# Patient Record
Sex: Female | Born: 1945 | Race: White | Hispanic: No | Smoking: Former smoker
Health system: Southern US, Community
[De-identification: ages and names within clinical notes are randomized; demographics above are authoritative.]

## PROBLEM LIST (undated history)

## (undated) DIAGNOSIS — E039 Hypothyroidism, unspecified: Secondary | ICD-10-CM

## (undated) DIAGNOSIS — S42309A Unspecified fracture of shaft of humerus, unspecified arm, initial encounter for closed fracture: Secondary | ICD-10-CM

## (undated) DIAGNOSIS — E669 Obesity, unspecified: Secondary | ICD-10-CM

## (undated) DIAGNOSIS — K5790 Diverticulosis of intestine, part unspecified, without perforation or abscess without bleeding: Secondary | ICD-10-CM

## (undated) DIAGNOSIS — Z86010 Personal history of colonic polyps: Secondary | ICD-10-CM

## (undated) DIAGNOSIS — M1990 Unspecified osteoarthritis, unspecified site: Secondary | ICD-10-CM

## (undated) DIAGNOSIS — K649 Unspecified hemorrhoids: Secondary | ICD-10-CM

## (undated) DIAGNOSIS — F419 Anxiety disorder, unspecified: Secondary | ICD-10-CM

## (undated) DIAGNOSIS — E785 Hyperlipidemia, unspecified: Secondary | ICD-10-CM

## (undated) DIAGNOSIS — I10 Essential (primary) hypertension: Secondary | ICD-10-CM

## (undated) DIAGNOSIS — F32A Depression, unspecified: Secondary | ICD-10-CM

## (undated) DIAGNOSIS — G4730 Sleep apnea, unspecified: Secondary | ICD-10-CM

## (undated) DIAGNOSIS — M7050 Other bursitis of knee, unspecified knee: Secondary | ICD-10-CM

## (undated) DIAGNOSIS — F329 Major depressive disorder, single episode, unspecified: Secondary | ICD-10-CM

## (undated) DIAGNOSIS — K219 Gastro-esophageal reflux disease without esophagitis: Secondary | ICD-10-CM

## (undated) DIAGNOSIS — J45909 Unspecified asthma, uncomplicated: Secondary | ICD-10-CM

## (undated) DIAGNOSIS — G43909 Migraine, unspecified, not intractable, without status migrainosus: Secondary | ICD-10-CM

## (undated) DIAGNOSIS — E119 Type 2 diabetes mellitus without complications: Secondary | ICD-10-CM

## (undated) DIAGNOSIS — T7840XA Allergy, unspecified, initial encounter: Secondary | ICD-10-CM

## (undated) HISTORY — DX: Type 2 diabetes mellitus without complications: E11.9

## (undated) HISTORY — DX: Personal history of colonic polyps: Z86.010

## (undated) HISTORY — DX: Diverticulosis of intestine, part unspecified, without perforation or abscess without bleeding: K57.90

## (undated) HISTORY — DX: Anxiety disorder, unspecified: F41.9

## (undated) HISTORY — DX: Allergy, unspecified, initial encounter: T78.40XA

## (undated) HISTORY — DX: Hyperlipidemia, unspecified: E78.5

## (undated) HISTORY — PX: GLAUCOMA SURGERY: SHX656

## (undated) HISTORY — PX: COLONOSCOPY: SHX174

## (undated) HISTORY — PX: CARPAL TUNNEL RELEASE: SHX101

## (undated) HISTORY — DX: Unspecified osteoarthritis, unspecified site: M19.90

## (undated) HISTORY — DX: Gastro-esophageal reflux disease without esophagitis: K21.9

## (undated) HISTORY — DX: Hypothyroidism, unspecified: E03.9

## (undated) HISTORY — DX: Sleep apnea, unspecified: G47.30

## (undated) HISTORY — PX: TOENAIL AVULSION: SHX1074

## (undated) HISTORY — DX: Obesity, unspecified: E66.9

## (undated) HISTORY — PX: POLYPECTOMY: SHX149

## (undated) HISTORY — DX: Essential (primary) hypertension: I10

## (undated) HISTORY — DX: Major depressive disorder, single episode, unspecified: F32.9

## (undated) HISTORY — DX: Other bursitis of knee, unspecified knee: M70.50

## (undated) HISTORY — DX: Migraine, unspecified, not intractable, without status migrainosus: G43.909

## (undated) HISTORY — PX: CATARACT EXTRACTION W/ INTRAOCULAR LENS  IMPLANT, BILATERAL: SHX1307

## (undated) HISTORY — DX: Unspecified asthma, uncomplicated: J45.909

## (undated) HISTORY — DX: Depression, unspecified: F32.A

## (undated) HISTORY — DX: Unspecified fracture of shaft of humerus, unspecified arm, initial encounter for closed fracture: S42.309A

---

## 1976-08-29 HISTORY — PX: EXCISION MORTON'S NEUROMA: SHX5013

## 1989-08-29 HISTORY — PX: PARTIAL HYSTERECTOMY: SHX80

## 1999-12-03 ENCOUNTER — Other Ambulatory Visit: Admission: RE | Admit: 1999-12-03 | Discharge: 1999-12-03 | Payer: Self-pay | Admitting: *Deleted

## 2003-10-31 ENCOUNTER — Encounter: Admission: RE | Admit: 2003-10-31 | Discharge: 2003-10-31 | Payer: Self-pay | Admitting: Family Medicine

## 2004-01-27 ENCOUNTER — Encounter: Admission: RE | Admit: 2004-01-27 | Discharge: 2004-01-27 | Payer: Self-pay | Admitting: Family Medicine

## 2004-09-06 ENCOUNTER — Encounter: Admission: RE | Admit: 2004-09-06 | Discharge: 2004-09-06 | Payer: Self-pay | Admitting: Family Medicine

## 2004-09-24 ENCOUNTER — Ambulatory Visit: Payer: Self-pay | Admitting: Internal Medicine

## 2004-09-24 ENCOUNTER — Ambulatory Visit: Payer: Self-pay | Admitting: Pulmonary Disease

## 2004-09-24 ENCOUNTER — Ambulatory Visit (HOSPITAL_BASED_OUTPATIENT_CLINIC_OR_DEPARTMENT_OTHER): Admission: RE | Admit: 2004-09-24 | Discharge: 2004-09-24 | Payer: Self-pay | Admitting: Family Medicine

## 2004-10-28 ENCOUNTER — Ambulatory Visit: Payer: Self-pay | Admitting: Internal Medicine

## 2005-07-13 ENCOUNTER — Ambulatory Visit: Payer: Self-pay | Admitting: Gastroenterology

## 2006-01-03 ENCOUNTER — Emergency Department (HOSPITAL_COMMUNITY): Admission: EM | Admit: 2006-01-03 | Discharge: 2006-01-03 | Payer: Self-pay | Admitting: Emergency Medicine

## 2006-10-16 ENCOUNTER — Encounter: Admission: RE | Admit: 2006-10-16 | Discharge: 2006-10-16 | Payer: Self-pay | Admitting: Family Medicine

## 2006-10-27 ENCOUNTER — Other Ambulatory Visit: Admission: RE | Admit: 2006-10-27 | Discharge: 2006-10-27 | Payer: Self-pay | Admitting: Family Medicine

## 2006-11-14 ENCOUNTER — Encounter: Admission: RE | Admit: 2006-11-14 | Discharge: 2006-11-14 | Payer: Self-pay | Admitting: Family Medicine

## 2009-07-09 ENCOUNTER — Encounter (INDEPENDENT_AMBULATORY_CARE_PROVIDER_SITE_OTHER): Payer: Self-pay | Admitting: *Deleted

## 2010-09-15 ENCOUNTER — Encounter
Admission: RE | Admit: 2010-09-15 | Discharge: 2010-09-15 | Payer: Self-pay | Source: Home / Self Care | Attending: Orthopedic Surgery | Admitting: Orthopedic Surgery

## 2010-11-18 ENCOUNTER — Ambulatory Visit (AMBULATORY_SURGERY_CENTER): Payer: Medicare Other | Admitting: *Deleted

## 2010-11-18 VITALS — Ht 64.0 in | Wt 238.0 lb

## 2010-11-18 DIAGNOSIS — Z86010 Personal history of colonic polyps: Secondary | ICD-10-CM

## 2010-11-18 MED ORDER — PEG-KCL-NACL-NASULF-NA ASC-C 100 G PO SOLR: 1.0000 | Freq: Once | ORAL | Status: AC

## 2010-12-02 ENCOUNTER — Encounter: Payer: Self-pay | Admitting: Internal Medicine

## 2010-12-02 ENCOUNTER — Ambulatory Visit (AMBULATORY_SURGERY_CENTER): Payer: Medicare Other | Admitting: Internal Medicine

## 2010-12-02 VITALS — BP 134/99 | HR 75 | Temp 97.9°F | Resp 18 | Ht 64.0 in | Wt 230.0 lb

## 2010-12-02 DIAGNOSIS — Z86010 Personal history of colonic polyps: Secondary | ICD-10-CM

## 2010-12-02 DIAGNOSIS — D126 Benign neoplasm of colon, unspecified: Secondary | ICD-10-CM

## 2010-12-02 DIAGNOSIS — K635 Polyp of colon: Secondary | ICD-10-CM

## 2010-12-02 DIAGNOSIS — Z860101 Personal history of adenomatous and serrated colon polyps: Secondary | ICD-10-CM

## 2010-12-02 DIAGNOSIS — Z1211 Encounter for screening for malignant neoplasm of colon: Secondary | ICD-10-CM

## 2010-12-02 DIAGNOSIS — K5730 Diverticulosis of large intestine without perforation or abscess without bleeding: Secondary | ICD-10-CM

## 2010-12-02 HISTORY — DX: Personal history of adenomatous and serrated colon polyps: Z86.0101

## 2010-12-02 HISTORY — DX: Personal history of colonic polyps: Z86.010

## 2010-12-02 MED ORDER — SODIUM CHLORIDE 0.9 % IV SOLN: 500.0000 mL | INTRAVENOUS | Status: DC

## 2010-12-02 NOTE — Patient Instructions (Signed)
One small polyp was removed and mild diverticulosis seen.   Polyps, Colon  A polyp is extra tissue that grows inside your body. Colon polyps grow in the large intestine. The large intestine, also called the colon, is part of your digestive system. It is a long, hollow tube at the end of your digestive tract where your body makes and stores stool. Most polyps are not dangerous. They are benign. This means they are not cancerous. But over time, some types of polyps can turn into cancer. Polyps that are smaller than a pea are usually not harmful. But larger polyps could someday become or may already be cancerous. To be safe, doctors remove all polyps and test them.  WHO GETS POLYPS? Anyone can get polyps, but certain people are more likely than others. You may have a greater chance of getting polyps if:  You are over 50.   You have had polyps before.   Someone in your family has had polyps.   Someone in your family has had cancer of the large intestine.   Find out if someone in your family has had polyps. You may also be more likely to get polyps if you:   Eat a lot of fatty foods   Smoke   Drink alcohol   Do not exercise  Eat too much  SYMPTOMS Most small polyps do not cause symptoms. People often do not know they have one until their caregiver finds it during a regular checkup or while testing them for something else. Some people do have symptoms like these:  Bleeding from the anus. You might notice blood on your underwear or on toilet paper after you have had a bowel movement.   Constipation or diarrhea that lasts more than a week.   Blood in the stool. Blood can make stool look black or it can show up as red streaks in the stool.  If you have any of these symptoms, see your caregiver. HOW DOES THE DOCTOR TEST FOR POLYPS? The doctor can use four tests to check for polyps:  Digital rectal exam. The caregiver wears gloves and checks your rectum (the last part of the large intestine)  to see if it feels normal. This test would find polyps only in the rectum. Your caregiver may need to do one of the other tests listed below to find polyps higher up in the intestine.   Barium enema. The caregiver puts a liquid called barium into your rectum before taking x-rays of your large intestine. Barium makes your intestine look white in the pictures. Polyps are dark, so they are easy to see.   Sigmoidoscopy. With this test, the caregiver can see inside your large intestine. A thin flexible tube is placed into your rectum. The device is called a sigmoidoscope, which has a light and a tiny video camera in it. The caregiver uses the sigmoidoscope to look at the last third of your large intestine.   Colonoscopy. This test is like sigmoidoscopy, but the caregiver looks at all of the large intestine. It usually requires sedation. This is the most common method for finding and removing polyps.  TREATMENT  The caregiver will remove the polyp during sigmoidoscopy or colonoscopy. The polyp is then tested for cancer.   If you have had polyps, your caregiver may want you to get tested regularly in the future.  PREVENTION There is not one sure way to prevent polyps. You might be able to lower your risk of getting them if you:  Eat more fruits and vegetables and less fatty food.   Do not smoke.   Avoid alcohol.   Exercise every day.   Lose weight if you are overweight.   Eating more calcium and folate can also lower your risk of getting polyps. Some foods that are rich in calcium are milk, cheese, and broccoli. Some foods that are rich in folate are chickpeas, kidney beans, and spinach.   Aspirin might help prevent polyps. Studies are under way.  Document Released: 05/11/2004 Document Re-Released: 02/02/2010 [facility] Patient Information 2011 [place], [place].  Diverticulosis Diverticulosis is a common condition that develops when small pouches (diverticula) form in the wall of the colon. The  risk of diverticulosis increases with age. It happens more often in people who eat a low-fiber diet. Most individuals with diverticulosis have no symptoms. Those individuals with symptoms usually experience belly (abdominal) pain, constipation, or loose stools (diarrhea). HOME CARE INSTRUCTIONS  Increase the amount of fiber in your diet as directed by your caregiver or dietician. This may reduce symptoms of diverticulosis.   Your caregiver may recommend taking a dietary fiber supplement.   Drink at least 6 to 8 glasses of water each day to prevent constipation.   Try not to strain when you have a bowel movement.   Your caregiver may recommend avoiding nuts and seeds to prevent complications, although this is still an uncertain benefit.   Only take over-the-counter or prescription medicines for pain, discomfort, or fever as directed by your caregiver.  FOODS HAVING HIGH FIBER CONTENT INCLUDE:  Fruits. Apple, peach, pear, tangerine, raisins, prunes.   Vegetables. Brussels sprouts, asparagus, broccoli, cabbage, carrot, cauliflower, romaine lettuce, spinach, summer squash, tomato, winter squash, zucchini.   Starchy Vegetables. Baked beans, kidney beans, lima beans, split peas, lentils, potatoes (with skin).   Grains. Whole wheat bread, brown rice, bran flake cereal, plain oatmeal, white rice, shredded wheat, bran muffins.  SEEK IMMEDIATE MEDICAL CARE IF:  You develop increasing pain or severe bloating.   You have an oral temperature above 100.33F, not controlled by medicine.   You develop vomiting or bowel movements that are bloody or black.  Document Released: 05/12/2004 Document Re-Released: 02/02/2010 [facility] Patient Information 2011 [place], [place].

## 2010-12-06 ENCOUNTER — Telehealth: Payer: Self-pay | Admitting: *Deleted

## 2010-12-06 NOTE — Telephone Encounter (Signed)
Follow up Call- Patient questions:  Do you have a fever, pain , or abdominal swelling? yes Pain Score  1 *  Have you tolerated food without any problems? yes  Have you been able to return to your normal activities? yes  Do you have any questions about your discharge instructions: Diet   no Medications  no Follow up visit  no  Do you have questions or concerns about your Care? no  Actions: * If pain score is 4 or above: No action needed, pain <4.

## 2010-12-14 ENCOUNTER — Encounter: Payer: Self-pay | Admitting: Internal Medicine

## 2010-12-14 NOTE — Progress Notes (Signed)
Quick Note:  6mm adenoma and mild diverticulosis Repeat colon 2017 ______

## 2011-01-14 NOTE — Procedures (Signed)
NAME:  Megan [name], Megan [name]            ACCOUNT NO.:  [id number]   MEDICAL RECORD NO.:  [id number]          PATIENT TYPE:  OUT   LOCATION:  SLEEP CENTER                 FACILITY:  [facility]   PHYSICIAN:  Clinton D. Maple Hudson, M.D. DATE OF BIRTH:  [birth year]   DATE OF STUDY:  09/24/2004                              NOCTURNAL POLYSOMNOGRAM   STUDY DATE:  September 24, 2004   REFERRING PHYSICIAN:  Dr. Donia Guiles   INDICATION FOR STUDY:  Hypersomnia with sleep apnea.  Epworth Sleepiness  Score 8/24, BMI 38.2, weight 230 pounds.   SLEEP ARCHITECTURE:  Total sleep time 300 minutes with sleep efficiency 72%.  Stage I was 12%, stage II 53%, stages III and IV 16%, REM was 19% of total  sleep time.  Sleep latency 5 minutes, REM latency 127 minutes, awake after  sleep onset 119 minutes, arousal index 7.2.  No medications taken.   RESPIRATORY DATA:  Split-study protocol.  RDI 45.4 obstructive events per  hour indicating moderately severe obstructive sleep apnea/hypopnea syndrome  before CPAP.  This included 74 obstructive apneas and 35 hypopneas before  CPAP.  Events were not positional with most sleep on left side.  REM RDI  35.8.  CPAP was titrated to 11 CWP, RDI 6.2/hr using a medium ResMed Ultra  Mirage Mask with heated humidifier.   OXYGEN DATA:  Mild to moderate snoring and mouth breathing with oxygen  desaturation to 71% before CPAP.  After CPAP control saturation held 95-98%  on room air.   CARDIAC DATA:  Normal sinus rhythm.   MOVEMENT/PARASOMNIA:  Insignificant occasional leg jerk.   IMPRESSION/RECOMMENDATION:  1.  Moderately severe obstructive sleep apnea/hypopnea syndrome, RDI 45.4/hr      with oxygen desaturation to 71%.  2.  Continuous positive airway pressure titration to 11 CWP, RDI 6.2/hr      using a medium ResMed Ultra Mirage Mask with heated humidifier.     CDY/MEDQ  D:  10/03/2004 10:58:18  T:  10/03/2004 21:52:39  Job:  [id number]

## 2011-10-31 ENCOUNTER — Emergency Department (HOSPITAL_COMMUNITY): Payer: Medicare Other

## 2011-10-31 ENCOUNTER — Encounter (HOSPITAL_COMMUNITY): Payer: Self-pay

## 2011-10-31 ENCOUNTER — Emergency Department (HOSPITAL_COMMUNITY)
Admission: EM | Admit: 2011-10-31 | Discharge: 2011-10-31 | Disposition: A | Discharge status: Home / Self Care | Payer: Medicare Other | Attending: Emergency Medicine | Admitting: Emergency Medicine

## 2011-10-31 DIAGNOSIS — J029 Acute pharyngitis, unspecified: Secondary | ICD-10-CM | POA: Insufficient documentation

## 2011-10-31 DIAGNOSIS — Z79899 Other long term (current) drug therapy: Secondary | ICD-10-CM | POA: Insufficient documentation

## 2011-10-31 DIAGNOSIS — E785 Hyperlipidemia, unspecified: Secondary | ICD-10-CM | POA: Insufficient documentation

## 2011-10-31 DIAGNOSIS — I10 Essential (primary) hypertension: Secondary | ICD-10-CM | POA: Insufficient documentation

## 2011-10-31 DIAGNOSIS — R1110 Vomiting, unspecified: Secondary | ICD-10-CM

## 2011-10-31 DIAGNOSIS — J40 Bronchitis, not specified as acute or chronic: Secondary | ICD-10-CM | POA: Insufficient documentation

## 2011-10-31 DIAGNOSIS — Z8739 Personal history of other diseases of the musculoskeletal system and connective tissue: Secondary | ICD-10-CM | POA: Insufficient documentation

## 2011-10-31 DIAGNOSIS — K219 Gastro-esophageal reflux disease without esophagitis: Secondary | ICD-10-CM | POA: Insufficient documentation

## 2011-10-31 DIAGNOSIS — H9209 Otalgia, unspecified ear: Secondary | ICD-10-CM | POA: Insufficient documentation

## 2011-10-31 LAB — URINALYSIS, ROUTINE W REFLEX MICROSCOPIC
Bilirubin Urine: NEGATIVE
Glucose, UA: NEGATIVE mg/dL
Ketones, ur: NEGATIVE mg/dL
Leukocytes, UA: NEGATIVE
Protein, ur: NEGATIVE mg/dL
pH: 5.5 (ref 5.0–8.0)

## 2011-10-31 LAB — POCT I-STAT, CHEM 8
BUN: 13 mg/dL (ref 6–23)
Calcium, Ion: 1.12 mmol/L (ref 1.12–1.32)
Chloride: 98 mEq/L (ref 96–112)
Glucose, Bld: 123 mg/dL — ABNORMAL HIGH (ref 70–99)
TCO2: 30 mmol/L (ref 0–100)

## 2011-10-31 LAB — DIFFERENTIAL
Eosinophils Relative: 3 % (ref 0–5)
Lymphocytes Relative: 20 % (ref 12–46)
Monocytes Absolute: 0.6 10*3/uL (ref 0.1–1.0)
Monocytes Relative: 6 % (ref 3–12)
Neutro Abs: 7.2 10*3/uL (ref 1.7–7.7)

## 2011-10-31 LAB — CBC
HCT: 36.1 % (ref 36.0–46.0)
Hemoglobin: 12.3 g/dL (ref 12.0–15.0)
MCV: 84.5 fL (ref 78.0–100.0)
RDW: 13.2 % (ref 11.5–15.5)
WBC: 10.1 10*3/uL (ref 4.0–10.5)

## 2011-10-31 MED ORDER — ONDANSETRON HCL 4 MG/2ML IJ SOLN
4.0000 mg | Freq: Once | INTRAMUSCULAR | Status: AC
  Administered 2011-10-31: 4 mg via INTRAVENOUS
  Filled 2011-10-31: qty 2

## 2011-10-31 MED ORDER — SODIUM CHLORIDE 0.9 % IV BOLUS (SEPSIS)
1000.0000 mL | Freq: Once | INTRAVENOUS | Status: AC
  Administered 2011-10-31: 1000 mL via INTRAVENOUS

## 2011-10-31 MED ORDER — SODIUM CHLORIDE 0.9 % IV BOLUS (SEPSIS)
500.0000 mL | Freq: Once | INTRAVENOUS | Status: AC
  Administered 2011-10-31: 500 mL via INTRAVENOUS

## 2011-10-31 NOTE — ED Notes (Signed)
Pt given discharge info and verb understanding. amb with steady gait to discharge window.  

## 2011-10-31 NOTE — ED Provider Notes (Signed)
History     CSN: [id number]  Arrival date & time 10/31/11  1100   First MD Initiated Contact with Patient 10/31/11 1127      Chief Complaint  Patient presents with  . Nausea  . Emesis  . Sore Throat    right side  . Otalgia    left side    (Consider location/radiation/quality/duration/timing/severity/associated sxs/prior treatment) HPI  66 year old female with cold symptoms for the past 5 days. Patient states initially she was having some sore throat follows with itchy eyes, ear aches, runny nose, dry cough, body aches, and headache. She was initially seen at her primary care office yesterday for the symptoms. It was thought to be due to an upper respiratory infection. Patient was prescribed cough medication, allergic medication, and amoxicillin. Patient states she went home, continues to have dry unproductive cough which induce vomiting from her violent cough. She has vomited 5 times within the past day. She denies any blood or bilious contents from  vomit. She denies any significant abdominal pain. She has subjective fever and pleuritic chest pain from coughing. She denies dysuria no rash. Patient feels dehydrated, constant vomiting and decided to come to the ED.  Past Medical History  Diagnosis Date  . Arthritis   . Allergy     bee stings  . Cataract     had surgery   . GERD (gastroesophageal reflux disease)   . Hyperlipidemia   . Hypertension   . Glaucoma   . Knee bursitis     Past Surgical History  Procedure Date  . Colonoscopy   . Partial hysterectomy     Left an ovary  . Cataract extraction w/ intraocular lens  implant, bilateral   . Glaucoma surgery     and laser surgery  . Carpal tunnel release     left wrist  . Excision morton's neuroma     left foot  . Abdominal hysterectomy     Family History  Problem Relation Age of Onset  . Colon polyps Maternal Grandfather   . Colon cancer Maternal Grandfather     History  Substance Use Topics  . Smoking  status: Former Games developer  . Smokeless tobacco: Not on file  . Alcohol Use: No    OB History    Grav Para Term Preterm Abortions TAB SAB Ect Mult Living                  Review of Systems  All other systems reviewed and are negative.    Allergies  Aspirin; Codeine; Sulfa antibiotics; Band-aid anti-itch; and Penicillins  Home Medications   Current Outpatient Rx  Name Route Sig Dispense Refill  . VITAMIN C 1000 MG PO TABS Oral Take 1,000 mg by mouth daily.      . ATENOLOL 50 MG PO TABS Oral Take 50 mg by mouth daily.      . CHLORTHALIDONE 25 MG PO TABS Oral Take 25 mg by mouth daily.      [name] VITAMIN D 1000 UNITS PO TABS Oral Take 1,000 Units by mouth daily.      . IBUPROFEN 600 MG PO TABS Oral Take 600 mg by mouth every 6 (six) hours as needed. Takes 1/2 tablet as needed     . OLMESARTAN MEDOXOMIL 40 MG PO TABS Oral Take 40 mg by mouth daily. Takes 1/2 tablet     . ZEGERID OTC PO Oral Take 20 mg by mouth daily.      Jenel Lucks OP Ophthalmic  Apply 1 drop to eye as needed. Dry eyes     . POTASSIUM 99 MG PO TABS Oral Take 1 tablet by mouth daily.      [name] SIMVASTATIN 20 MG PO TABS Oral Take 20 mg by mouth at bedtime. Takes 1/2 tablet     . VITAMIN E 200 UNITS PO CAPS Oral Take 200 Units by mouth daily.        BP 138/71  Pulse 70  Temp(Src) 98.1 F (36.7 C) (Oral)  Resp 16  Ht 5\' 4"  (1.626 m)  Wt 240 lb (108.863 kg)  BMI 41.20 kg/m2  SpO2 96%  Physical Exam  Nursing note and vitals reviewed. Constitutional: She appears well-developed and well-nourished. No distress.       Awake, alert, nontoxic appearance  HENT:  Head: Atraumatic.  Right Ear: Tympanic membrane is injected.  Left Ear: Tympanic membrane is injected.  Nose: Nose normal.  Mouth/Throat: Uvula is midline, oropharynx is clear and moist and mucous membranes are normal.  Eyes: EOM are normal. Pupils are equal, round, and reactive to light. Right eye exhibits no discharge. Left eye exhibits no discharge. Left  conjunctiva is injected.  Neck: Neck supple.  Cardiovascular: Normal rate and regular rhythm.   Pulmonary/Chest: Effort normal and breath sounds normal. No respiratory distress. She exhibits no tenderness.  Abdominal: Soft. There is no tenderness. There is no rebound.  Musculoskeletal: She exhibits no tenderness.       ROM appears intact, no obvious focal weakness  Neurological:       Mental status and motor strength appears intact  Skin: No rash noted.  Psychiatric: She has a normal mood and affect.    ED Course  Procedures (including critical care time)  Labs Reviewed - No data to display No results found.   No diagnosis found.  Results for orders placed during the hospital encounter of 10/31/11  CBC      Component Value Range   WBC 10.1  4.0 - 10.5 (K/uL)   RBC 4.27  3.87 - 5.11 (MIL/uL)   Hemoglobin 12.3  12.0 - 15.0 (g/dL)   HCT 78.2  95.6 - 21.3 (%)   MCV 84.5  78.0 - 100.0 (fL)   MCH 28.8  26.0 - 34.0 (pg)   MCHC 34.1  30.0 - 36.0 (g/dL)   RDW 08.6  57.8 - 46.9 (%)   Platelets 254  150 - 400 (K/uL)  DIFFERENTIAL      Component Value Range   Neutrophils Relative 71  43 - 77 (%)   Neutro Abs 7.2  1.7 - 7.7 (K/uL)   Lymphocytes Relative 20  12 - 46 (%)   Lymphs Abs 2.1  0.7 - 4.0 (K/uL)   Monocytes Relative 6  3 - 12 (%)   Monocytes Absolute 0.6  0.1 - 1.0 (K/uL)   Eosinophils Relative 3  0 - 5 (%)   Eosinophils Absolute 0.3  0.0 - 0.7 (K/uL)   Basophils Relative 0  0 - 1 (%)   Basophils Absolute 0.0  0.0 - 0.1 (K/uL)  URINALYSIS, ROUTINE W REFLEX MICROSCOPIC      Component Value Range   Color, Urine YELLOW  YELLOW    APPearance CLEAR  CLEAR    Specific Gravity, Urine 1.016  1.005 - 1.030    pH 5.5  5.0 - 8.0    Glucose, UA NEGATIVE  NEGATIVE (mg/dL)   Hgb urine dipstick NEGATIVE  NEGATIVE    Bilirubin Urine NEGATIVE  NEGATIVE  Ketones, ur NEGATIVE  NEGATIVE (mg/dL)   Protein, ur NEGATIVE  NEGATIVE (mg/dL)   Urobilinogen, UA 0.2  0.0 - 1.0 (mg/dL)    Nitrite NEGATIVE  NEGATIVE    Leukocytes, UA NEGATIVE  NEGATIVE   POCT I-STAT, CHEM 8      Component Value Range   Sodium 135  135 - 145 (mEq/L)   Potassium 3.5  3.5 - 5.1 (mEq/L)   Chloride 98  96 - 112 (mEq/L)   BUN 13  6 - 23 (mg/dL)   Creatinine, Ser 1.61  0.50 - 1.10 (mg/dL)   Glucose, Bld 096 (*) 70 - 99 (mg/dL)   Calcium, Ion 0.45  4.09 - 1.32 (mmol/L)   TCO2 30  0 - 100 (mmol/L)   Hemoglobin 12.2  12.0 - 15.0 (g/dL)   HCT 81.1  91.4 - 78.2 (%)   Dg Chest 2 View  10/31/2011  *RADIOLOGY REPORT*  Clinical Data: Cough.  Congestion.  Chest pain.  CHEST - 2 VIEW  Comparison: 11/14/2006  Findings: Heart size is at the upper limits of normal.  Mediastinal shadows are normal.  There is mild central bronchial thickening. There is mild chronic scarring in the lingula.  No evidence of active infiltrate, mass, effusion or collapse.  The vascularity is normal.  No significant bony finding.  IMPRESSION: Central bronchial thickening.  Mild scarring in the lingula.  No active disease is evident.  Original Report Authenticated By: Thomasenia Sales, M.D.      MDM  Patient symptoms is suggestive of an upper respiratory infection. Her lung is clear on auscultation. I believe her vomiting is from persistent coughing. Her abdomen is nontender on exam. Negative Murphy's sign.  Patient will be treated symptomatically with Zofran, and IV fluid. Due to the duration of her symptoms and persistent cough, a chest x-ray was obtained. basic labs obtained to rule out any electrolytes abnormalities.  12:41 PM Chest x-ray shows central bronchial thickening but no evidence of active disease. The symptoms are supportive of bronchitis. She has already been prescribe antibiotic, cough medication, and allergy medication.  Patient has normal white count and normal electrolytes. We'll continue to treat her symptomatically.  1:07 PM Pt request for more IVF and antinausea medication.  Has not vomited in ED.  Pt is safe to be  discharge.  Care discussed with my attending.  Fayrene Helper, PA-C 10/31/11 1336

## 2011-10-31 NOTE — ED Notes (Signed)
Pt here from home with multiple complaints: left sided otalgia, dental pain, sight sided throat pain, n/v. Generalized ill feeling, was at urgent care center yesterday and was told to come here if not feeling better.

## 2011-10-31 NOTE — Discharge Instructions (Signed)

## 2011-10-31 NOTE — ED Provider Notes (Signed)
Medical screening examination/treatment/procedure(s) were performed by non-physician practitioner and as supervising physician I was immediately available for consultation/collaboration.   Jihaad Bruschi A. Patrica Duel, MD 10/31/11 1350

## 2013-01-02 ENCOUNTER — Ambulatory Visit
Admission: RE | Admit: 2013-01-02 | Discharge: 2013-01-02 | Disposition: A | Discharge status: Home / Self Care | Payer: Medicare Other | Source: Ambulatory Visit | Attending: Family Medicine | Admitting: Family Medicine

## 2013-01-02 ENCOUNTER — Other Ambulatory Visit: Payer: Self-pay | Admitting: Family Medicine

## 2013-01-02 DIAGNOSIS — R609 Edema, unspecified: Secondary | ICD-10-CM

## 2013-02-14 ENCOUNTER — Encounter: Payer: Self-pay | Admitting: *Deleted

## 2013-02-14 ENCOUNTER — Encounter: Payer: Medicare Other | Attending: Family Medicine | Admitting: *Deleted

## 2013-02-14 VITALS — Ht 64.0 in | Wt 250.0 lb

## 2013-02-14 DIAGNOSIS — Z713 Dietary counseling and surveillance: Secondary | ICD-10-CM | POA: Insufficient documentation

## 2013-02-14 DIAGNOSIS — E119 Type 2 diabetes mellitus without complications: Secondary | ICD-10-CM | POA: Insufficient documentation

## 2013-02-14 NOTE — Progress Notes (Signed)
  Patient was seen on 06/19/2014for the first of a series of three diabetes self-management courses at the [facility]. The following learning objectives were met by the patient during this course:   Defines the role of glucose and insulin  Identifies type of diabetes and pathophysiology  Defines the diagnostic criteria for diabetes and prediabetes  States the risk factors for Type 2 Diabetes  States the symptoms of Type 2 Diabetes  Defines Type 2 Diabetes treatment goals  Defines Type 2 Diabetes treatment options  States the rationale for glucose monitoring  Identifies A1C, glucose targets, and testing times  Identifies proper sharps disposal  Defines the purpose of a diabetes food plan  Identifies carbohydrate food groups  Defines effects of carbohydrate foods on glucose levels  Identifies carbohydrate choices/grams/food labels  States benefits of physical activity and effect on glucose  Review of suggested activity guidelines  Handouts given during class include:  Type 2 Diabetes: Basics Book  My Food Plan Book  Food and Activity Log  Follow-Up Plan: Attend Core 2 and 3

## 2013-02-14 NOTE — Patient Instructions (Addendum)
Goals:  Follow Diabetes Meal Plan as instructed  Eat 3 meals and 2 snacks, every 3-5 hrs  Limit carbohydrate intake to 30-45 grams carbohydrate/meal  Limit carbohydrate intake to 15 grams carbohydrate/snack  Add lean protein foods to meals/snacks  Monitor glucose levels as instructed by your doctor  Aim for 30 mins of physical activity daily  Bring food record and glucose log to your next nutrition visit 

## 2013-02-22 ENCOUNTER — Encounter: Payer: Medicare Other | Admitting: *Deleted

## 2013-02-22 DIAGNOSIS — E119 Type 2 diabetes mellitus without complications: Secondary | ICD-10-CM

## 2013-02-22 NOTE — Progress Notes (Signed)
  Patient was seen on 02/22/13 for the second of a series of three diabetes self-management courses at the [facility]. The following learning objectives were met by the patient during this course:   Explain basic nutrition maintenance and quality assurance  Describe causes, symptoms and treatment of hypoglycemia and hyperglycemia  Explain how to manage diabetes during illness  Describe the importance of good nutrition for health and healthy eating strategies  List strategies to follow meal plan when dining out  Describe the effects of alcohol on glucose and how to use it safely  Describe problem solving skills for day-to-day glucose challenges  Describe strategies to use when treatment plan needs to change  Identify important factors involved in successful weight loss  Describe ways to remain physically active  Describe the impact of regular activity on insulin resistance   Handouts given in class:  Refrigerator magnet for Sick Day Guidelines  [facility] Oral medication/insulin handout  Follow-Up Plan: Patient will attend the final class of the ADA Diabetes Self-Care Education.   

## 2013-03-07 ENCOUNTER — Encounter: Payer: Medicare Other | Attending: Family Medicine | Admitting: *Deleted

## 2013-03-07 DIAGNOSIS — E119 Type 2 diabetes mellitus without complications: Secondary | ICD-10-CM

## 2013-03-07 DIAGNOSIS — Z713 Dietary counseling and surveillance: Secondary | ICD-10-CM | POA: Insufficient documentation

## 2013-03-07 NOTE — Progress Notes (Signed)
Patient was seen on 03/07/13 for the third of a series of three diabetes self-management courses at the [facility]. The following learning objectives were met by the patient during this course:   Describe how diabetes changes over time  Identify diabetes complications and ways to prevent them  Describe strategies that can promote heart health including lowering blood pressure and cholesterol  Describe strategies to lower dietary fat and sodium in the diet  Identify physical activities that benefit cardiovascular health  Evaluate success in meeting personal goal  Describe the belief that they can live successfully with diabetes day to day  Establish 2-3 goals that they will plan to diligently work on until they return for the free 57-month follow-up visit  The following handouts were given in class:  3 Month Follow Up Visit handout  Goal setting handout  Class evaluation form  Your patient has established the following 3 month goals for diabetes self-care:  Count carbohydrates at most meals and snacks Increase my activity 4 times a week Keep glucose log  Follow-Up Plan:  Patient will attend a 3 month follow-up visit for diabetes self-management education.

## 2013-03-18 ENCOUNTER — Encounter (HOSPITAL_COMMUNITY): Payer: Self-pay | Admitting: *Deleted

## 2013-03-18 ENCOUNTER — Emergency Department (HOSPITAL_COMMUNITY): Payer: Medicare Other

## 2013-03-18 ENCOUNTER — Emergency Department (HOSPITAL_COMMUNITY)
Admission: EM | Admit: 2013-03-18 | Discharge: 2013-03-18 | Disposition: A | Discharge status: Home / Self Care | Payer: Medicare Other | Attending: Emergency Medicine | Admitting: Emergency Medicine

## 2013-03-18 DIAGNOSIS — E669 Obesity, unspecified: Secondary | ICD-10-CM | POA: Insufficient documentation

## 2013-03-18 DIAGNOSIS — R609 Edema, unspecified: Secondary | ICD-10-CM | POA: Insufficient documentation

## 2013-03-18 DIAGNOSIS — L02619 Cutaneous abscess of unspecified foot: Secondary | ICD-10-CM | POA: Insufficient documentation

## 2013-03-18 DIAGNOSIS — M2550 Pain in unspecified joint: Secondary | ICD-10-CM | POA: Insufficient documentation

## 2013-03-18 DIAGNOSIS — Z880 Allergy status to penicillin: Secondary | ICD-10-CM | POA: Insufficient documentation

## 2013-03-18 DIAGNOSIS — L03031 Cellulitis of right toe: Secondary | ICD-10-CM

## 2013-03-18 DIAGNOSIS — K219 Gastro-esophageal reflux disease without esophagitis: Secondary | ICD-10-CM | POA: Insufficient documentation

## 2013-03-18 DIAGNOSIS — Z8669 Personal history of other diseases of the nervous system and sense organs: Secondary | ICD-10-CM | POA: Insufficient documentation

## 2013-03-18 DIAGNOSIS — I10 Essential (primary) hypertension: Secondary | ICD-10-CM | POA: Insufficient documentation

## 2013-03-18 DIAGNOSIS — Z79899 Other long term (current) drug therapy: Secondary | ICD-10-CM | POA: Insufficient documentation

## 2013-03-18 DIAGNOSIS — E785 Hyperlipidemia, unspecified: Secondary | ICD-10-CM | POA: Insufficient documentation

## 2013-03-18 DIAGNOSIS — L539 Erythematous condition, unspecified: Secondary | ICD-10-CM | POA: Insufficient documentation

## 2013-03-18 DIAGNOSIS — Z8739 Personal history of other diseases of the musculoskeletal system and connective tissue: Secondary | ICD-10-CM | POA: Insufficient documentation

## 2013-03-18 DIAGNOSIS — L03039 Cellulitis of unspecified toe: Secondary | ICD-10-CM | POA: Insufficient documentation

## 2013-03-18 DIAGNOSIS — E119 Type 2 diabetes mellitus without complications: Secondary | ICD-10-CM | POA: Insufficient documentation

## 2013-03-18 DIAGNOSIS — R110 Nausea: Secondary | ICD-10-CM | POA: Insufficient documentation

## 2013-03-18 DIAGNOSIS — IMO0001 Reserved for inherently not codable concepts without codable children: Secondary | ICD-10-CM | POA: Insufficient documentation

## 2013-03-18 MED ORDER — HYDROCODONE-ACETAMINOPHEN 5-325 MG PO TABS
2.0000 | ORAL_TABLET | Freq: Once | ORAL | Status: AC
  Administered 2013-03-18: 2 via ORAL
  Filled 2013-03-18: qty 2

## 2013-03-18 MED ORDER — HYDROCODONE-ACETAMINOPHEN 5-325 MG PO TABS: 2.0000 | ORAL_TABLET | ORAL | Status: DC | PRN

## 2013-03-18 MED ORDER — DOXYCYCLINE HYCLATE 100 MG PO CAPS: 100.0000 mg | ORAL_CAPSULE | Freq: Two times a day (BID) | ORAL | Status: DC

## 2013-03-18 MED ORDER — ONDANSETRON 4 MG PO TBDP: 4.0000 mg | ORAL_TABLET | Freq: Three times a day (TID) | ORAL | Status: DC | PRN

## 2013-03-18 NOTE — ED Provider Notes (Signed)
History    CSN: [id number] Arrival date & time 03/18/13  1314  First MD Initiated Contact with Patient 03/18/13 1338     Chief Complaint  Patient presents with  . Nail Problem   (Consider location/radiation/quality/duration/timing/severity/associated sxs/prior Treatment) HPI Pt is a 67yo female presenting with increased redness, swelling, and pain of right great toe.  Pt also notes increased thickening and yellowing of right great toe.  Symptoms started Thursday 03/14/13 and have gradually worsened. Pain is aching, throbbing 2/10 but worse when standing, 5/10.  Denies fever, n/v/d. Pt states she is borderline diabetic but does not take medication.    Past Medical History  Diagnosis Date  . Arthritis   . Allergy     bee stings  . Cataract     had surgery   . GERD (gastroesophageal reflux disease)   . Hyperlipidemia   . Hypertension   . Glaucoma   . Knee bursitis   . Sleep apnea   . Obesity    Past Surgical History  Procedure Laterality Date  . Colonoscopy    . Partial hysterectomy      Left an ovary  . Cataract extraction w/ intraocular lens  implant, bilateral    . Glaucoma surgery      and laser surgery  . Carpal tunnel release      left wrist  . Excision morton's neuroma      left foot  . Abdominal hysterectomy     Family History  Problem Relation Age of Onset  . Colon polyps Maternal Grandfather   . Colon cancer Maternal Grandfather   . Hypertension Mother   . Hypertension Father   . Asthma Brother   . Obesity Other   . Diabetes Other    History  Substance Use Topics  . Smoking status: Never Smoker   . Smokeless tobacco: Not on file  . Alcohol Use: No   OB History   Grav Para Term Preterm Abortions TAB SAB Ect Mult Living                 Review of Systems  Constitutional: Negative for fever and chills.  Gastrointestinal: Positive for nausea. Negative for vomiting and diarrhea.  Musculoskeletal: Positive for myalgias, joint swelling and  arthralgias.       Right foot and great toe   Skin: Positive for color change.  All other systems reviewed and are negative.    Allergies  Aspirin; Codeine; Sulfa antibiotics; Camphor; and Penicillins  Home Medications   Current Outpatient Rx  Name  Route  Sig  Dispense  Refill  . atenolol-chlorthalidone (TENORETIC) 50-25 MG per tablet   Oral   Take 1 tablet by mouth daily.         [name] docusate sodium (COLACE) 100 MG capsule   Oral   Take 100 mg by mouth daily.         . hydroxychloroquine (PLAQUENIL) 200 MG tablet   Oral   Take 400 mg by mouth daily.         [name] losartan (COZAAR) 50 MG tablet   Oral   Take 50 mg by mouth daily.         . Melatonin 5 MG TABS   Oral   Take 5 mg by mouth at bedtime.         . meloxicam (MOBIC) 15 MG tablet   Oral   Take 15 mg by mouth daily.         . Multiple Vitamins-Calcium (  ONE-A-DAY WOMENS FORMULA PO)   Oral   Take by mouth.         . naproxen sodium (ANAPROX) 220 MG tablet   Oral   Take 220 mg by mouth 2 (two) times daily with a meal.         . Omeprazole-Sodium Bicarbonate (ZEGERID OTC PO)   Oral   Take 20 mg by mouth daily.           Bertram Gala Glycol-Propyl Glycol (SYSTANE ULTRA OP)   Ophthalmic   Apply 1 drop to eye as needed. Dry eyes          . Potassium Gluconate 550 MG TABS   Oral   Take 550 mg by mouth daily.         . simvastatin (ZOCOR) 20 MG tablet   Oral   Take 20 mg by mouth at bedtime. Takes 1/2 tablet          . vitamin E 200 UNIT capsule   Oral   Take 400 Units by mouth daily.          [name] doxycycline (VIBRAMYCIN) 100 MG capsule   Oral   Take 1 capsule (100 mg total) by mouth 2 (two) times daily.   20 capsule   0   . HYDROcodone-acetaminophen (NORCO/VICODIN) 5-325 MG per tablet   Oral   Take 2 tablets by mouth every 4 (four) hours as needed for pain.   6 tablet   0   . ondansetron (ZOFRAN ODT) 4 MG disintegrating tablet   Oral   Take 1 tablet (4 mg total) by  mouth every 8 (eight) hours as needed for nausea.   10 tablet   0    BP 153/75  Pulse 67  Temp(Src) 98.4 F (36.9 C) (Oral)  Resp 16  Ht 5\' 4"  (1.626 m)  Wt 241 lb (109.317 kg)  BMI 41.35 kg/m2  SpO2 98% Physical Exam  Nursing note and vitals reviewed. Constitutional: She appears well-developed and well-nourished.  HENT:  Head: Normocephalic and atraumatic.  Eyes: Conjunctivae are normal. No scleral icterus.  Neck: Normal range of motion.  Cardiovascular: Normal rate, regular rhythm and normal heart sounds.   Pulmonary/Chest: Effort normal and breath sounds normal. No respiratory distress. She has no wheezes. She has no rales. She exhibits no tenderness.  Musculoskeletal: Normal range of motion. She exhibits edema (moderate right foot) and tenderness ( moderate, right great toe).       Feet:  Neurological: She is alert.  Skin: Skin is warm and dry. There is erythema.  Erythema of right great toe, no active drainage. Yellowing and thickening of nail  Psychiatric: She has a normal mood and affect. Her behavior is normal.    ED Course  NAIL REMOVAL Date/Time: 03/18/2013 3:42 PM Performed by: ZAMMIT, JOSEPH L Authorized by: Junius Finner Consent: Verbal consent obtained. Risks and benefits: risks, benefits and alternatives were discussed Consent given by: patient Patient understanding: patient states understanding of the procedure being performed Patient consent: the patient's understanding of the procedure matches consent given Patient identity confirmed: verbally with patient Location: right foot Location details: right big toe Anesthesia: digital block Local anesthetic: lidocaine 1% without epinephrine Anesthetic total: 8 ml Patient sedated: no Preparation: skin prepped with Betadine Amount removed: complete Wedge excision of skin of nail fold: no Nail bed sutured: no Nail matrix removed: complete Removed nail replaced and anchored: no Dressing: dressing  applied Patient tolerance: Patient tolerated the procedure well with no immediate  complications.   (including critical care time) Labs Reviewed - No data to display Dg Foot Complete Right  03/18/2013   *RADIOLOGY REPORT*  Clinical Data: Great toe pain and swelling, infection, nail removed  RIGHT FOOT COMPLETE - 3+ VIEW  Comparison: None  Findings: Bones appear demineralized. Joint spaces preserved. Question bone destruction involving the lateral aspect of the tuft of the distal phalanx. No fracture, dislocation or additional bone destruction seen. Soft tissue swelling great toe and throughout the right foot particularly the dorsum without definite soft tissue gas.  IMPRESSION: Questionable bone destruction involving the lateral aspect of the tuft of the distal phalanx of the right great toe, cannot exclude osteomyelitis. Diffuse soft tissue swelling.   Original Report Authenticated By: Ulyses Southward, M.D.   1. Cellulitis of great toe, right   2. Paronychia of great toe of right foot     MDM  Pt appears to have bacterial or fungal infection of nail.  Will remove toe and look under nail.  Pt will need PO antibiotics. Discussed pt with Dr. Estell Harpin who assisted with nail removal.  Plain films were taken after procedure.    Plain Films Right foot: questionable osteomyelitis of distal phalanx of right great toe.    Will discharge pt home with Rx doxycycline as pt is allergic to sulfa and PCN.  Vicodin for pain and zofran for pt's nausea. Pt is to f/u with [facility] (pt has been in past) in 2-3 days. Return precautions given. Pt verbalized understanding and agreement with tx plan. Vitals: unremarkable. Discharged in stable condition.    Discussed pt with attending during ED encounter.   Junius Finner, PA-C 03/18/13 1628

## 2013-03-18 NOTE — ED Notes (Signed)
Pt states Thursday started to notice her R big toe turning red around toe nail, toe nail turned a yellow color, then today having pain in that big toe.

## 2013-03-19 NOTE — ED Provider Notes (Signed)
Medical screening examination/treatment/procedure(s) were conducted as a shared visit with non-physician practitioner(s) and myself.  I personally evaluated the patient during the encounter   Benny Lennert, MD 03/19/13 1110

## 2013-06-06 ENCOUNTER — Encounter: Payer: Medicare Other | Attending: Family Medicine | Admitting: *Deleted

## 2013-06-06 ENCOUNTER — Encounter: Payer: Self-pay | Admitting: *Deleted

## 2013-06-06 VITALS — Ht 64.0 in | Wt 247.0 lb

## 2013-06-06 DIAGNOSIS — Z713 Dietary counseling and surveillance: Secondary | ICD-10-CM | POA: Insufficient documentation

## 2013-06-06 DIAGNOSIS — E119 Type 2 diabetes mellitus without complications: Secondary | ICD-10-CM | POA: Insufficient documentation

## 2013-06-06 NOTE — Progress Notes (Signed)
  Patient was seen on 06/06/13 for their 3 month follow-up as a part of the diabetes self-management courses at the [facility]. The following learning objectives were met by your patient during this course:  Current HbA1c: 6.4%  Your patient established the following 3 month goals for diabetes self-care at last visit:  Count carbohydrates at most meals and snacks - she does this as much as possible Increase my activity 4 times a week - walks around the parking lot at church 3 times a week.  Has appointment with PT to start exercises for knee Keep glucose log- she has been keeping a glucose log  Patient self reports the following:  Diabetes control has improved since diabetes self-management training: "I think it's improve.  I can now see my information and it take the guess work out of it" Number of days blood glucose is >200: "none" Last MD appointment for diabetes: "last month" Changes in treatment plan: "none needed" Confidence with ability to manage diabetes: " pretty good" Areas for improvement with diabetes self-care: "physical activity" Willingness to participate in diabetes support group: "not at this time"  Please see Diabetes Flow sheet for findings related to patient's self-care.  Follow-Up Plan: Patient is eligible for a "free" 30 minute diabetes self-care appointment in the next year. Patient to call and schedule as needed.   Strongly emphasized importance of regularly activity.  Suggested water aerobics or stationary bike through [employer]

## 2013-06-25 ENCOUNTER — Ambulatory Visit (INDEPENDENT_AMBULATORY_CARE_PROVIDER_SITE_OTHER): Payer: Medicare Other

## 2013-06-25 VITALS — BP 155/77 | HR 64 | Resp 24 | Ht 64.0 in | Wt 242.0 lb

## 2013-06-25 DIAGNOSIS — B351 Tinea unguium: Secondary | ICD-10-CM

## 2013-06-25 DIAGNOSIS — E1140 Type 2 diabetes mellitus with diabetic neuropathy, unspecified: Secondary | ICD-10-CM

## 2013-06-25 DIAGNOSIS — M79609 Pain in unspecified limb: Secondary | ICD-10-CM

## 2013-06-25 DIAGNOSIS — E1149 Type 2 diabetes mellitus with other diabetic neurological complication: Secondary | ICD-10-CM

## 2013-06-25 DIAGNOSIS — M2040 Other hammer toe(s) (acquired), unspecified foot: Secondary | ICD-10-CM

## 2013-06-25 NOTE — Progress Notes (Signed)
  Subjective:    Patient ID: Megan [name], female    DOB: Oct 27, 1945, 67 y.o.   MRN: [id number] "He's going to check my toes and trim my toenails.  My left big toenail is doing like the right one, it feels like the nail is pushing in." Both hallux nails are somewhat friable and the left incurvated discolored brittle separation of the is becoming discolored due to the overlapping of the second digit.  HPI no changes in health or medication history at the present time to    Review of Systems deferred at this visit     Objective:   Physical Exam Neurovascular status as follows pedal pulses dorsalis pedis palpable +2/4 bilateral. PT pulse weakly palpable at plus one over 4 bilateral.. Capillary refill time 3 seconds all digits. Skin temperature warm turgor normal + edema bilateral. No varicosities noted. Neurologically epicritic and proprioceptive sensations diminished on Semmes Weinstein testing to forefoot and digits bilateral. There is normal plantar response DTRs not listed. Dermatologically skin color pigment normal hair growth absent bilateral. Nails thick and brittle and criptotic with both hallux nails separating for the nailbed consistent with onychomycosis and lysis of the nail beds. Previously left hallux less involved than right. Tenderness on palpation both hallux nails.     Assessment & Plan:  Assessment this time his diabetes with history of neuropathy decreased epicritic sensation confirmed on Semmes Weinstein testing . Dystrophic necrotic nails both hallux debridement at this time. Recommend followup palliative nail care 3 months to be repeated. We'll obtain authorization for diabetic extra-depth shoes. 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 This is leading to the nail dystrophy in symptomology. We'll obtain shoe authorization and follow up with appropriate extra-depth shoes with  appropriate.  Alvan Dame DPM

## 2013-06-25 NOTE — Patient Instructions (Signed)

## 2013-08-14 ENCOUNTER — Telehealth: Payer: Self-pay | Admitting: *Deleted

## 2013-08-14 NOTE — Telephone Encounter (Signed)
I informed pt that I had misspelled her name, which slowed down the process of ordering her shoes.  I apologized and told her our Diabetic shoe specialist would expedite the process.

## 2013-09-02 NOTE — Telephone Encounter (Signed)
Received required paperwork from pt's doctor. Notified pt on Friday (08-30-13). Requested that she schedule an appt for measuring.

## 2013-09-06 ENCOUNTER — Other Ambulatory Visit: Payer: Medicare Other

## 2013-09-13 ENCOUNTER — Other Ambulatory Visit: Payer: Medicare Other

## 2013-09-20 ENCOUNTER — Emergency Department (HOSPITAL_COMMUNITY): Payer: Medicare Other

## 2013-09-20 ENCOUNTER — Encounter (HOSPITAL_COMMUNITY): Payer: Self-pay | Admitting: Emergency Medicine

## 2013-09-20 ENCOUNTER — Emergency Department (HOSPITAL_COMMUNITY)
Admission: EM | Admit: 2013-09-20 | Discharge: 2013-09-20 | Disposition: A | Discharge status: Home / Self Care | Payer: Medicare Other | Attending: Emergency Medicine | Admitting: Emergency Medicine

## 2013-09-20 DIAGNOSIS — R51 Headache: Secondary | ICD-10-CM | POA: Insufficient documentation

## 2013-09-20 DIAGNOSIS — I10 Essential (primary) hypertension: Secondary | ICD-10-CM | POA: Insufficient documentation

## 2013-09-20 DIAGNOSIS — Z880 Allergy status to penicillin: Secondary | ICD-10-CM | POA: Insufficient documentation

## 2013-09-20 DIAGNOSIS — Z791 Long term (current) use of non-steroidal anti-inflammatories (NSAID): Secondary | ICD-10-CM | POA: Insufficient documentation

## 2013-09-20 DIAGNOSIS — M129 Arthropathy, unspecified: Secondary | ICD-10-CM | POA: Insufficient documentation

## 2013-09-20 DIAGNOSIS — K219 Gastro-esophageal reflux disease without esophagitis: Secondary | ICD-10-CM | POA: Insufficient documentation

## 2013-09-20 DIAGNOSIS — R519 Headache, unspecified: Secondary | ICD-10-CM

## 2013-09-20 DIAGNOSIS — R112 Nausea with vomiting, unspecified: Secondary | ICD-10-CM | POA: Insufficient documentation

## 2013-09-20 DIAGNOSIS — IMO0002 Reserved for concepts with insufficient information to code with codable children: Secondary | ICD-10-CM | POA: Insufficient documentation

## 2013-09-20 DIAGNOSIS — Z79899 Other long term (current) drug therapy: Secondary | ICD-10-CM | POA: Insufficient documentation

## 2013-09-20 DIAGNOSIS — J209 Acute bronchitis, unspecified: Secondary | ICD-10-CM | POA: Insufficient documentation

## 2013-09-20 DIAGNOSIS — Z8669 Personal history of other diseases of the nervous system and sense organs: Secondary | ICD-10-CM | POA: Insufficient documentation

## 2013-09-20 DIAGNOSIS — E669 Obesity, unspecified: Secondary | ICD-10-CM | POA: Insufficient documentation

## 2013-09-20 DIAGNOSIS — E785 Hyperlipidemia, unspecified: Secondary | ICD-10-CM | POA: Insufficient documentation

## 2013-09-20 DIAGNOSIS — Z792 Long term (current) use of antibiotics: Secondary | ICD-10-CM | POA: Insufficient documentation

## 2013-09-20 LAB — CBC WITH DIFFERENTIAL/PLATELET
Basophils Absolute: 0 10*3/uL (ref 0.0–0.1)
Basophils Relative: 0 % (ref 0–1)
Eosinophils Absolute: 0 10*3/uL (ref 0.0–0.7)
Eosinophils Relative: 0 % (ref 0–5)
HCT: 37 % (ref 36.0–46.0)
HEMOGLOBIN: 12.7 g/dL (ref 12.0–15.0)
LYMPHS ABS: 3.1 10*3/uL (ref 0.7–4.0)
Lymphocytes Relative: 28 % (ref 12–46)
MCH: 30 pg (ref 26.0–34.0)
MCHC: 34.3 g/dL (ref 30.0–36.0)
MCV: 87.3 fL (ref 78.0–100.0)
MONOS PCT: 7 % (ref 3–12)
Monocytes Absolute: 0.8 10*3/uL (ref 0.1–1.0)
NEUTROS ABS: 7.4 10*3/uL (ref 1.7–7.7)
NEUTROS PCT: 65 % (ref 43–77)
Platelets: 261 10*3/uL (ref 150–400)
RBC: 4.24 MIL/uL (ref 3.87–5.11)
RDW: 13.7 % (ref 11.5–15.5)
WBC: 11.4 10*3/uL — AB (ref 4.0–10.5)

## 2013-09-20 LAB — BASIC METABOLIC PANEL
BUN: 27 mg/dL — AB (ref 6–23)
CHLORIDE: 98 meq/L (ref 96–112)
CO2: 27 mEq/L (ref 19–32)
Calcium: 9.6 mg/dL (ref 8.4–10.5)
Creatinine, Ser: 0.65 mg/dL (ref 0.50–1.10)
GFR calc non Af Amer: 90 mL/min — ABNORMAL LOW (ref >90)
GLUCOSE: 128 mg/dL — AB (ref 70–99)
POTASSIUM: 4.2 meq/L (ref 3.7–5.3)
Sodium: 139 mEq/L (ref 137–147)

## 2013-09-20 LAB — SEDIMENTATION RATE: Sed Rate: 24 mm/hr — ABNORMAL HIGH (ref 0–22)

## 2013-09-20 MED ORDER — DIPHENHYDRAMINE HCL 50 MG/ML IJ SOLN
25.0000 mg | Freq: Once | INTRAMUSCULAR | Status: AC
  Administered 2013-09-20: 25 mg via INTRAVENOUS
  Filled 2013-09-20: qty 1

## 2013-09-20 MED ORDER — ACETAMINOPHEN 500 MG PO TABS
1000.0000 mg | ORAL_TABLET | Freq: Once | ORAL | Status: AC
  Administered 2013-09-20: 1000 mg via ORAL
  Filled 2013-09-20: qty 2

## 2013-09-20 MED ORDER — PROCHLORPERAZINE EDISYLATE 5 MG/ML IJ SOLN
10.0000 mg | Freq: Once | INTRAMUSCULAR | Status: AC
  Administered 2013-09-20: 10 mg via INTRAVENOUS
  Filled 2013-09-20: qty 2

## 2013-09-20 MED ORDER — SODIUM CHLORIDE 0.9 % IV BOLUS (SEPSIS)
1000.0000 mL | Freq: Once | INTRAVENOUS | Status: AC
  Administered 2013-09-20: 1000 mL via INTRAVENOUS

## 2013-09-20 MED ORDER — ONDANSETRON HCL 4 MG/2ML IJ SOLN
4.0000 mg | Freq: Once | INTRAMUSCULAR | Status: AC
  Administered 2013-09-20: 4 mg via INTRAVENOUS
  Filled 2013-09-20: qty 2

## 2013-09-20 NOTE — Discharge Instructions (Signed)
Arterial Hypertension °Arterial hypertension (high blood pressure) is a condition of elevated pressure in your blood vessels. Hypertension over a long period of time is a risk factor for strokes, heart attacks, and heart failure. It is also the leading cause of kidney (renal) failure.  °CAUSES  °· In Adults -- Over 90% of all hypertension has no known cause. This is called essential or primary hypertension. In the other 10% of people with hypertension, the increase in blood pressure is caused by another disorder. This is called secondary hypertension. Important causes of secondary hypertension are: °· Heavy alcohol use. °· Obstructive sleep apnea. °· Hyperaldosterosim (Conn's syndrome). °· Steroid use. °· Chronic kidney failure. °· Hyperparathyroidism. °· Medications. °· Renal artery stenosis. °· Pheochromocytoma. °· Cushing's disease. °· Coarctation of the aorta. °· Scleroderma renal crisis. °· Licorice (in excessive amounts). °· Drugs (cocaine, methamphetamine). °Your caregiver can explain any items above that apply to you. °· In Children -- Secondary hypertension is more common and should always be considered. °· Pregnancy -- Few women of childbearing age have high blood pressure. However, up to 10% of them develop hypertension of pregnancy. Generally, this will not harm the woman. It may be a sign of 3 complications of pregnancy: preeclampsia, HELLP syndrome, and eclampsia. Follow up and control with medication is necessary. °SYMPTOMS  °· This condition normally does not produce any noticeable symptoms. It is usually found during a routine exam. °· Malignant hypertension is a late problem of high blood pressure. It may have the following symptoms: °· Headaches. °· Blurred vision. °· End-organ damage (this means your kidneys, heart, lungs, and other organs are being damaged). °· Stressful situations can increase the blood pressure. If a person with normal blood pressure has their blood pressure go up while being  seen by their caregiver, this is often termed "white coat hypertension." Its importance is not known. It may be related with eventually developing hypertension or complications of hypertension. °· Hypertension is often confused with mental tension, stress, and anxiety. °DIAGNOSIS  °The diagnosis is made by 3 separate blood pressure measurements. They are taken at least 1 week apart from each other. If there is organ damage from hypertension, the diagnosis may be made without repeat measurements. °Hypertension is usually identified by having blood pressure readings: °· Above 140/90 mmHg measured in both arms, at 3 separate times, over a couple weeks. °· Over 130/80 mmHg should be considered a risk factor and may require treatment in patients with diabetes. °Blood pressure readings over 120/80 mmHg are called "pre-hypertension" even in non-diabetic patients. °To get a true blood pressure measurement, use the following guidelines. Be aware of the factors that can alter blood pressure readings. °· Take measurements at least 1 hour after caffeine. °· Take measurements 30 minutes after smoking and without any stress. This is another reason to quit smoking  it raises your blood pressure. °· Use a proper cuff size. Ask your caregiver if you are not sure about your cuff size. °· Most home blood pressure cuffs are automatic. They will measure systolic and diastolic pressures. The systolic pressure is the pressure reading at the start of sounds. Diastolic pressure is the pressure at which the sounds disappear. If you are elderly, measure pressures in multiple postures. Try sitting, lying or standing. °· Sit at rest for a minimum of 5 minutes before taking measurements. °· You should not be on any medications like decongestants. These are found in many cold medications. °· Record your blood pressure readings and review   them with your caregiver. °If you have hypertension: °· Your caregiver may do tests to be sure you do not have  secondary hypertension (see "causes" above). °· Your caregiver may also look for signs of metabolic syndrome. This is also called Syndrome X or Insulin Resistance Syndrome. You may have this syndrome if you have type 2 diabetes, abdominal obesity, and abnormal blood lipids in addition to hypertension. °· Your caregiver will take your medical and family history and perform a physical exam. °· Diagnostic tests may include blood tests (for glucose, cholesterol, potassium, and kidney function), a urinalysis, or an EKG. Other tests may also be necessary depending on your condition. °PREVENTION  °There are important lifestyle issues that you can adopt to reduce your chance of developing hypertension: °· Maintain a normal weight. °· Limit the amount of salt (sodium) in your diet. °· Exercise often. °· Limit alcohol intake. °· Get enough potassium in your diet. Discuss specific advice with your caregiver. °· Follow a DASH diet (dietary approaches to stop hypertension). This diet is rich in fruits, vegetables, and low-fat dairy products, and avoids certain fats. °PROGNOSIS  °Essential hypertension cannot be cured. Lifestyle changes and medical treatment can lower blood pressure and reduce complications. The prognosis of secondary hypertension depends on the underlying cause. Many people whose hypertension is controlled with medicine or lifestyle changes can live a normal, healthy life.  °RISKS AND COMPLICATIONS  °While high blood pressure alone is not an illness, it often requires treatment due to its short- and long-term effects on many organs. Hypertension increases your risk for: °· CVAs or strokes (cerebrovascular accident). °· Heart failure due to chronically high blood pressure (hypertensive cardiomyopathy). °· Heart attack (myocardial infarction). °· Damage to the retina (hypertensive retinopathy). °· Kidney failure (hypertensive nephropathy). °Your caregiver can explain list items above that apply to you. Treatment  of hypertension can significantly reduce the risk of complications. °TREATMENT  °· For overweight patients, weight loss and regular exercise are recommended. Physical fitness lowers blood pressure. °· Mild hypertension is usually treated with diet and exercise. A diet rich in fruits and vegetables, fat-free dairy products, and foods low in fat and salt (sodium) can help lower blood pressure. Decreasing salt intake decreases blood pressure in a 1/3 of people. °· Stop smoking if you are a smoker. °The steps above are highly effective in reducing blood pressure. While these actions are easy to suggest, they are difficult to achieve. Most patients with moderate or severe hypertension end up requiring medications to bring their blood pressure down to a normal level. There are several classes of medications for treatment. Blood pressure pills (antihypertensives) will lower blood pressure by their different actions. Lowering the blood pressure by 10 mmHg may decrease the risk of complications by as much as 25%. °The goal of treatment is effective blood pressure control. This will reduce your risk for complications. Your caregiver will help you determine the best treatment for you according to your lifestyle. What is excellent treatment for one person, may not be for you. °HOME CARE INSTRUCTIONS  °· Do not smoke. °· Follow the lifestyle changes outlined in the "Prevention" section. °· If you are on medications, follow the directions carefully. Blood pressure medications must be taken as prescribed. Skipping doses reduces their benefit. It also puts you at risk for problems. °· Follow up with your caregiver, as directed. °· If you are asked to monitor your blood pressure at home, follow the guidelines in the "Diagnosis" section above. °SEEK MEDICAL CARE   IF:   You think you are having medication side effects.  You have recurrent headaches or lightheadedness.  You have swelling in your ankles.  You have trouble with  your vision. SEEK IMMEDIATE MEDICAL CARE IF:   You have sudden onset of chest pain or pressure, difficulty breathing, or other symptoms of a heart attack.  You have a severe headache.  You have symptoms of a stroke (such as sudden weakness, difficulty speaking, difficulty walking). MAKE SURE YOU:   Understand these instructions.  Will watch your condition.  Will get help right away if you are not doing well or get worse. Document Released: 08/15/2005 Document Revised: 11/07/2011 Document Reviewed: 03/15/2007 [employer] Patient Information 2014 [place].  Headaches, Frequently Asked Questions MIGRAINE HEADACHES Q: What is migraine? What causes it? How can I treat it? A: Generally, migraine headaches begin as a dull ache. Then they develop into a constant, throbbing, and pulsating pain. You may experience pain at the temples. You may experience pain at the front or back of one or both sides of the head. The pain is usually accompanied by a combination of:  Nausea.  Vomiting.  Sensitivity to light and noise. Some people (about 15%) experience an aura (see below) before an attack. The cause of migraine is believed to be chemical reactions in the brain. Treatment for migraine may include over-the-counter or prescription medications. It may also include self-help techniques. These include relaxation training and biofeedback.  Q: What is an aura? A: About 15% of people with migraine get an "aura". This is a sign of neurological symptoms that occur before a migraine headache. You may see wavy or jagged lines, dots, or flashing lights. You might experience tunnel vision or blind spots in one or both eyes. The aura can include visual or auditory hallucinations (something imagined). It may include disruptions in smell (such as strange odors), taste or touch. Other symptoms include:  Numbness.  A "pins and needles" sensation.  Difficulty in recalling or speaking the correct word. These  neurological events may last as long as 60 minutes. These symptoms will fade as the headache begins. Q: What is a trigger? A: Certain physical or environmental factors can lead to or "trigger" a migraine. These include:  Foods.  Hormonal changes.  Weather.  Stress. It is important to remember that triggers are different for everyone. To help prevent migraine attacks, you need to figure out which triggers affect you. Keep a headache diary. This is a good way to track triggers. The diary will help you talk to your healthcare professional about your condition. Q: Does weather affect migraines? A: Bright sunshine, hot, humid conditions, and drastic changes in barometric pressure may lead to, or "trigger," a migraine attack in some people. But studies have shown that weather does not act as a trigger for everyone with migraines. Q: What is the link between migraine and hormones? A: Hormones start and regulate many of your body's functions. Hormones keep your body in balance within a constantly changing environment. The levels of hormones in your body are unbalanced at times. Examples are during menstruation, pregnancy, or menopause. That can lead to a migraine attack. In fact, about three quarters of all women with migraine report that their attacks are related to the menstrual cycle.  Q: Is there an increased risk of stroke for migraine sufferers? A: The likelihood of a migraine attack causing a stroke is very remote. That is not to say that migraine sufferers cannot have a stroke associated  with their migraines. In persons under age 40, the most common associated factor for stroke is migraine headache. But over the course of a person's normal life span, the occurrence of migraine headache may actually be associated with a reduced risk of dying from cerebrovascular disease due to stroke.  °Q: What are acute medications for migraine? °A: Acute medications are used to treat the pain of the headache after  it has started. Examples over-the-counter medications, NSAIDs, ergots, and triptans.  °Q: What are the triptans? °A: Triptans are the newest class of abortive medications. They are specifically targeted to treat migraine. Triptans are vasoconstrictors. They moderate some chemical reactions in the brain. The triptans work on receptors in your brain. Triptans help to restore the balance of a neurotransmitter called serotonin. Fluctuations in levels of serotonin are thought to be a main cause of migraine.  °Q: Are over-the-counter medications for migraine effective? °A: Over-the-counter, or "OTC," medications may be effective in relieving mild to moderate pain and associated symptoms of migraine. But you should see your caregiver before beginning any treatment regimen for migraine.  °Q: What are preventive medications for migraine? °A: Preventive medications for migraine are sometimes referred to as "prophylactic" treatments. They are used to reduce the frequency, severity, and length of migraine attacks. Examples of preventive medications include antiepileptic medications, antidepressants, beta-blockers, calcium channel blockers, and NSAIDs (nonsteroidal anti-inflammatory drugs). °Q: Why are anticonvulsants used to treat migraine? °A: During the past few years, there has been an increased interest in antiepileptic drugs for the prevention of migraine. They are sometimes referred to as "anticonvulsants". Both epilepsy and migraine may be caused by similar reactions in the brain.  °Q: Why are antidepressants used to treat migraine? °A: Antidepressants are typically used to treat people with depression. They may reduce migraine frequency by regulating chemical levels, such as serotonin, in the brain.  °Q: What alternative therapies are used to treat migraine? °A: The term "alternative therapies" is often used to describe treatments considered outside the scope of conventional Western medicine. Examples of alternative  therapy include acupuncture, acupressure, and yoga. Another common alternative treatment is herbal therapy. Some herbs are believed to relieve headache pain. Always discuss alternative therapies with your caregiver before proceeding. Some herbal products contain arsenic and other toxins. °TENSION HEADACHES °Q: What is a tension-type headache? What causes it? How can I treat it? °A: Tension-type headaches occur randomly. They are often the result of temporary stress, anxiety, fatigue, or anger. Symptoms include soreness in your temples, a tightening band-like sensation around your head (a "vice-like" ache). Symptoms can also include a pulling feeling, pressure sensations, and contracting head and neck muscles. The headache begins in your forehead, temples, or the back of your head and neck. Treatment for tension-type headache may include over-the-counter or prescription medications. Treatment may also include self-help techniques such as relaxation training and biofeedback. °CLUSTER HEADACHES °Q: What is a cluster headache? What causes it? How can I treat it? °A: Cluster headache gets its name because the attacks come in groups. The pain arrives with little, if any, warning. It is usually on one side of the head. A tearing or bloodshot eye and a runny nose on the same side of the headache may also accompany the pain. Cluster headaches are believed to be caused by chemical reactions in the brain. They have been described as the most severe and intense of any headache type. Treatment for cluster headache includes prescription medication and oxygen. °SINUS HEADACHES °Q: What is a sinus   headache? What causes it? How can I treat it? A: When a cavity in the bones of the face and skull (a sinus) becomes inflamed, the inflammation will cause localized pain. This condition is usually the result of an allergic reaction, a tumor, or an infection. If your headache is caused by a sinus blockage, such as an infection, you will  probably have a fever. An x-ray will confirm a sinus blockage. Your caregiver's treatment might include antibiotics for the infection, as well as antihistamines or decongestants.  REBOUND HEADACHES Q: What is a rebound headache? What causes it? How can I treat it? A: A pattern of taking acute headache medications too often can lead to a condition known as "rebound headache." A pattern of taking too much headache medication includes taking it more than 2 days per week or in excessive amounts. That means more than the label or a caregiver advises. With rebound headaches, your medications not only stop relieving pain, they actually begin to cause headaches. Doctors treat rebound headache by tapering the medication that is being overused. Sometimes your caregiver will gradually substitute a different type of treatment or medication. Stopping may be a challenge. Regularly overusing a medication increases the potential for serious side effects. Consult a caregiver if you regularly use headache medications more than 2 days per week or more than the label advises. ADDITIONAL QUESTIONS AND ANSWERS Q: What is biofeedback? A: Biofeedback is a self-help treatment. Biofeedback uses special equipment to monitor your body's involuntary physical responses. Biofeedback monitors:  Breathing.  Pulse.  Heart rate.  Temperature.  Muscle tension.  Brain activity. Biofeedback helps you refine and perfect your relaxation exercises. You learn to control the physical responses that are related to stress. Once the technique has been mastered, you do not need the equipment any more. Q: Are headaches hereditary? A: Four out of five (80%) of people that suffer report a family history of migraine. Scientists are not sure if this is genetic or a family predisposition. Despite the uncertainty, a child has a 50% chance of having migraine if one parent suffers. The child has a 75% chance if both parents suffer.  Q: Can children  get headaches? A: By the time they reach high school, most young people have experienced some type of headache. Many safe and effective approaches or medications can prevent a headache from occurring or stop it after it has begun.  Q: What type of doctor should I see to diagnose and treat my headache? A: Start with your primary caregiver. Discuss his or her experience and approach to headaches. Discuss methods of classification, diagnosis, and treatment. Your caregiver may decide to recommend you to a headache specialist, depending upon your symptoms or other physical conditions. Having diabetes, allergies, etc., may require a more comprehensive and inclusive approach to your headache. The [employer] will provide, upon request, a list of [facility] physician members in your state. Document Released: 11/05/2003 Document Revised: 11/07/2011 Document Reviewed: 04/14/2008 [facility] Patient Information 2014 [place].

## 2013-09-20 NOTE — ED Provider Notes (Signed)
CSN: [id number]     Arrival date & time 09/20/13  0536 History   First MD Initiated Contact with Patient 09/20/13 [phone]     Chief Complaint  Patient presents with  . Headache  . Hypertension   (Consider location/radiation/quality/duration/timing/severity/associated sxs/prior Treatment) HPI Comments: Patient is a 68 year old female with history of hypertension, diabetes, hyperlipidemia, who presents today with a severe headache. She reports that the headache began yesterday around 1 AM. The headache gradually worsened throughout the morning, but then resolved. She again woke up with a headache  That gradually worsened over an hour this morning. Her headache is now improving. She describes headache as a sharp sensation between her temples. She has associated nausea. She vomited yesterday. Nothing seems to make her pain better. Nothing seems to make her pain worse. She took atenolol and HCTZ this morning. These are her normal morning hypertension medications. Generally she does not take them until 9 AM. She initially measured her blood pressure as 180/106. She has been recently sick with bronchitis. She was diagnosed 2 weeks ago and has been taking Tessalon Perles and doxycycline. She denies fever, chills, abdominal pain, numbness, weakness, paresthesias, visual disturbance, photophobia.  The history is provided by the patient. No language interpreter was used.    Past Medical History  Diagnosis Date  . Arthritis   . Allergy     bee stings  . Cataract     had surgery   . GERD (gastroesophageal reflux disease)   . Hyperlipidemia   . Hypertension   . Glaucoma   . Knee bursitis   . Sleep apnea   . Obesity    Past Surgical History  Procedure Laterality Date  . Colonoscopy    . Partial hysterectomy      Left an ovary  . Cataract extraction w/ intraocular lens  implant, bilateral    . Glaucoma surgery      and laser surgery  . Carpal tunnel release      left wrist  . Excision morton's  neuroma      left foot  . Abdominal hysterectomy    . Toenail avulsion Right    Family History  Problem Relation Age of Onset  . Colon polyps Maternal Grandfather   . Colon cancer Maternal Grandfather   . Hypertension Mother   . Hypertension Father   . Asthma Brother   . Obesity Other   . Diabetes Other    History  Substance Use Topics  . Smoking status: Never Smoker   . Smokeless tobacco: Not on file  . Alcohol Use: No   OB History   Grav Para Term Preterm Abortions TAB SAB Ect Mult Living                 Review of Systems  Constitutional: Negative for fever and chills.  Respiratory: Negative for shortness of breath.   Cardiovascular: Negative for chest pain.  Gastrointestinal: Positive for nausea and vomiting. Negative for abdominal pain.  Neurological: Positive for headaches.  All other systems reviewed and are negative.    Allergies  Aspirin; Codeine; Sulfa antibiotics; Camphor; and Penicillins  Home Medications   Current Outpatient Rx  Name  Route  Sig  Dispense  Refill  . atenolol-chlorthalidone (TENORETIC) 50-25 MG per tablet   Oral   Take 1 tablet by mouth daily.         . benzonatate (TESSALON) 100 MG capsule   Oral   Take 100 mg by mouth 3 (three) times daily.         [name]  docusate sodium (COLACE) 100 MG capsule   Oral   Take 100 mg by mouth daily.         [name] doxycycline (VIBRAMYCIN) 100 MG capsule   Oral   Take 100 mg by mouth 2 (two) times daily.         . folic acid (FOLVITE) 1 MG tablet   Oral   Take 1 mg by mouth daily.         [name] losartan (COZAAR) 50 MG tablet   Oral   Take 50 mg by mouth daily.         . Melatonin 5 MG TABS   Oral   Take 5 mg by mouth at bedtime.         . meloxicam (MOBIC) 15 MG tablet   Oral   Take 15 mg by mouth daily.         . Methotrexate, PF, 25 MG/0.4ML SOAJ   Subcutaneous   Inject 250 mg into the skin once a week.         . Multiple Vitamins-Calcium (ONE-A-DAY WOMENS FORMULA PO)    Oral   Take by mouth.         . naproxen sodium (ANAPROX) 220 MG tablet   Oral   Take 220 mg by mouth 2 (two) times daily with a meal.         . Omeprazole-Sodium Bicarbonate (ZEGERID OTC PO)   Oral   Take 20 mg by mouth daily.           . Potassium Gluconate 550 MG TABS   Oral   Take 550 mg by mouth daily.         . predniSONE (DELTASONE) 10 MG tablet   Oral   Take 10 mg by mouth daily with breakfast.         . simvastatin (ZOCOR) 20 MG tablet   Oral   Take 20 mg by mouth at bedtime. Takes 1/2 tablet          . vitamin E 200 UNIT capsule   Oral   Take 400 Units by mouth daily.           BP 159/74  Pulse 60  Temp(Src) 97.5 F (36.4 C) (Oral)  Resp 18  SpO2 99% Physical Exam  Nursing note and vitals reviewed. Constitutional: She is oriented to person, place, and time. She appears well-developed and well-nourished. No distress.  HENT:  Head: Normocephalic and atraumatic.  Right Ear: External ear normal.  Left Ear: External ear normal.  Nose: Nose normal.  Mouth/Throat: Oropharynx is clear and moist.  ttp over right temple  Eyes: EOM are normal. Pupils are equal, round, and reactive to light.  Neck: Normal range of motion.  No nuchal rigidity or meningeal signs  Cardiovascular: Normal rate, regular rhythm, normal heart sounds, intact distal pulses and normal pulses.   Pulmonary/Chest: Effort normal and breath sounds normal. No stridor. No respiratory distress. She has no wheezes. She has no rales.  Abdominal: Soft. She exhibits no distension.  Musculoskeletal: Normal range of motion.  Strength 5/5 in all extremities  Neurological: She is alert and oriented to person, place, and time. She has normal strength. Coordination normal.  Finger nose finger normal, rapid alternating movements normal, heel knee shin normal. No pronator drift or facial droop.   Skin: Skin is warm and dry. She is not diaphoretic. No erythema.  Psychiatric: She has a normal mood  and affect. Her behavior is normal.  ED Course  Procedures (including critical care time) Labs Review Labs Reviewed  CBC WITH DIFFERENTIAL - Abnormal; Notable for the following:    WBC 11.4 (*)    All other components within normal limits  BASIC METABOLIC PANEL - Abnormal; Notable for the following:    Glucose, Bld 128 (*)    BUN 27 (*)    GFR calc non Af Amer 90 (*)    All other components within normal limits  SEDIMENTATION RATE - Abnormal; Notable for the following:    Sed Rate 24 (*)    All other components within normal limits   Imaging Review Ct Head Wo Contrast  09/20/2013   CLINICAL DATA:  Severe headache  EXAM: CT HEAD WITHOUT CONTRAST  TECHNIQUE: Contiguous axial images were obtained from the base of the skull through the vertex without intravenous contrast. Study was obtained within 24 hr of patient's arrival at the emergency department.  COMPARISON:  None.  FINDINGS: The ventricles are normal in size and configuration. There is no mass, hemorrhage, extra-axial fluid collection, or midline shift. Gray-white compartments are normal. There is no demonstrable acute infarct.  Bony calvarium appears intact. The mastoid air cells are clear. There is ethmoid sinus disease bilaterally. There is a retention cyst in the inferior left maxillary antrum.  IMPRESSION: Paranasal sinus disease.  Study otherwise unremarkable.   Electronically Signed   By: Lowella Grip M.D.   On: 09/20/2013 07:12    EKG Interpretation   None       Date: 09/20/2013  Rate: 58  Rhythm: normal sinus rhythm  QRS Axis: normal  Intervals: QT prolonged  ST/T Wave abnormalities: normal  Conduction Disutrbances:none  Narrative Interpretation:   Old EKG Reviewed: none available    MDM   1. Headache   2. Hypertension    Pt HA treated and improved while in ED.  Presentation is like pts typical HA and non concerning for [facility], ICH, Meningitis, or temporal arteritis. Pt is afebrile with no focal neuro  deficits, nuchal rigidity, or change in vision. Pt is to follow up with PCP to discuss prophylactic medication. Pt verbalizes understanding and is agreeable with plan to dc. Dr. Regenia Skeeter evaluated patient and agrees with plan. Patient / Family / Caregiver informed of clinical course, understand medical decision-making process, and agree with plan.      Elwyn Lade, PA-C 09/20/13 1028

## 2013-09-20 NOTE — ED Notes (Signed)
Woke up with severe h/a - worse than a migraine. sbp > 180.  Took a atenolol - hctz 50/25 and bp is down.  H/a 9/10. Also, has bronchitis.  Did not take doxycyline b/c inc. Bp.

## 2013-09-21 NOTE — ED Provider Notes (Signed)
Medical screening examination/treatment/procedure(s) were conducted as a shared visit with non-physician practitioner(s) and myself.  I personally evaluated the patient during the encounter.  EKG Interpretation   None       Patient with waxing and waning headache with HTN at home. Not significantly elevated here (160s). Neurologically intact, improved somewhat with HA cocktail. Some mild right temporal tenderness but no visual sx. ESR less than 40, feel temporal arteritis is unlikely. As she has improved and has normal exam I feel she is OK to be discharged with outpatient PCP f/u.  Ephraim Hamburger, MD 09/21/13 1135

## 2013-09-24 ENCOUNTER — Ambulatory Visit (INDEPENDENT_AMBULATORY_CARE_PROVIDER_SITE_OTHER): Payer: Medicare Other

## 2013-09-24 VITALS — BP 111/67 | HR 71 | Resp 16

## 2013-09-24 DIAGNOSIS — E1140 Type 2 diabetes mellitus with diabetic neuropathy, unspecified: Secondary | ICD-10-CM

## 2013-09-24 DIAGNOSIS — E1149 Type 2 diabetes mellitus with other diabetic neurological complication: Secondary | ICD-10-CM

## 2013-09-24 DIAGNOSIS — B351 Tinea unguium: Secondary | ICD-10-CM

## 2013-09-24 DIAGNOSIS — Q828 Other specified congenital malformations of skin: Secondary | ICD-10-CM

## 2013-09-24 DIAGNOSIS — E1142 Type 2 diabetes mellitus with diabetic polyneuropathy: Secondary | ICD-10-CM

## 2013-09-24 DIAGNOSIS — M79609 Pain in unspecified limb: Secondary | ICD-10-CM

## 2013-09-24 NOTE — Patient Instructions (Signed)
Diabetes and Foot Care Diabetes may cause you to have problems because of poor blood supply (circulation) to your feet and legs. This may cause the skin on your feet to become thinner, break easier, and heal more slowly. Your skin may become dry, and the skin may peel and crack. You may also have nerve damage in your legs and feet causing decreased feeling in them. You may not notice minor injuries to your feet that could lead to infections or more serious problems. Taking care of your feet is one of the most important things you can do for yourself.  HOME CARE INSTRUCTIONS  Wear shoes at all times, even in the house. Do not go barefoot. Bare feet are easily injured.  Check your feet daily for blisters, cuts, and redness. If you cannot see the bottom of your feet, use a mirror or ask someone for help.  Wash your feet with warm water (do not use hot water) and mild soap. Then pat your feet and the areas between your toes until they are completely dry. Do not soak your feet as this can dry your skin.  Apply a moisturizing lotion or petroleum jelly (that does not contain alcohol and is unscented) to the skin on your feet and to dry, brittle toenails. Do not apply lotion between your toes.  Trim your toenails straight across. Do not dig under them or around the cuticle. File the edges of your nails with an emery board or nail file.  Do not cut corns or calluses or try to remove them with medicine.  Wear clean socks or stockings every day. Make sure they are not too tight. Do not wear knee-high stockings since they may decrease blood flow to your legs.  Wear shoes that fit properly and have enough cushioning. To break in new shoes, wear them for just a few hours a day. This prevents you from injuring your feet. Always look in your shoes before you put them on to be sure there are no objects inside.  Do not cross your legs. This may decrease the blood flow to your feet.  If you find a minor scrape,  cut, or break in the skin on your feet, keep it and the skin around it clean and dry. These areas may be cleansed with mild soap and water. Do not cleanse the area with peroxide, alcohol, or iodine.  When you remove an adhesive bandage, be sure not to damage the skin around it.  If you have a wound, look at it several times a day to make sure it is healing.  Do not use heating pads or hot water bottles. They may burn your skin. If you have lost feeling in your feet or legs, you may not know it is happening until it is too late.  Make sure your health care provider performs a complete foot exam at least annually or more often if you have foot problems. Report any cuts, sores, or bruises to your health care provider immediately. SEEK MEDICAL CARE IF:   You have an injury that is not healing.  You have cuts or breaks in the skin.  You have an ingrown nail.  You notice redness on your legs or feet.  You feel burning or tingling in your legs or feet.  You have pain or cramps in your legs and feet.  Your legs or feet are numb.  Your feet always feel cold. SEEK IMMEDIATE MEDICAL CARE IF:   There is increasing redness,   swelling, or pain in or around a wound.  There is a red line that goes up your leg.  Pus is coming from a wound.  You develop a fever or as directed by your health care provider.  You notice a bad smell coming from an ulcer or wound. Document Released: 08/12/2000 Document Revised: 04/17/2013 Document Reviewed: 01/22/2013 [employer] Patient Information 2014 [employer].  

## 2013-09-24 NOTE — Progress Notes (Signed)
   Subjective:    Patient ID: AUDIE WIESER, female    DOB: 03/14/1946, 68 y.o.   MRN: [id number]  HPI Comments: "Trim the toenails and the callus on the left foot"     Review of Systems no new changes or findings noted     Objective:   Physical Exam Vascular status is intact with pedal pulses palpable DP +2/4 bilateral PT one over 4 bilateral Refill time 3 seconds all digits skin temperature warm turgor normal no edema rubor pallor or varicosities noted neurologically epicritic and proprioceptive sensations diminished with decreased sensation to forefoot and digits bilateral. Dermatologically there is keratotic lesion sub-fifth MTP area left also pinch callus of the right hallux although not as severe. No open wounds or ulcerations are noted no secondary infection is noted. Nails thick brittle friable criptotic discolored tender and uncomfortable on both on palpation and inflow shoewear 1 through 5 bilateral patient been applying topical antifungal as instructed advised to continue to do so. Measurements for diabetics extra shoes and Boffo impressions for custom molded dual density Plastizote inlays are carried out at this time patient does have digital contractures plantar flexed metatarsal history of keratoses as well neuropathy and complications.       Assessment & Plan:  Assessment this time his diabetes with history peripheral neuropathy and complications measurements for shoes carried out debridement of mycotic nails 1 through 5 bilateral this time is carried out also debridement of keratotic lesion sub-fifth left is carried out return for future palliative care in 3 months for an as-needed basis. Patient be called the next month when shoes and insoles are ready for fitting and dispensing  Harriet Masson DPM

## 2013-11-01 ENCOUNTER — Ambulatory Visit (INDEPENDENT_AMBULATORY_CARE_PROVIDER_SITE_OTHER): Payer: Medicare Other

## 2013-11-01 ENCOUNTER — Ambulatory Visit: Payer: Medicare Other

## 2013-11-01 VITALS — BP 151/73 | HR 64 | Resp 16

## 2013-11-01 DIAGNOSIS — E1142 Type 2 diabetes mellitus with diabetic polyneuropathy: Secondary | ICD-10-CM

## 2013-11-01 DIAGNOSIS — E1149 Type 2 diabetes mellitus with other diabetic neurological complication: Secondary | ICD-10-CM

## 2013-11-01 DIAGNOSIS — M2040 Other hammer toe(s) (acquired), unspecified foot: Secondary | ICD-10-CM

## 2013-11-01 DIAGNOSIS — E1140 Type 2 diabetes mellitus with diabetic neuropathy, unspecified: Secondary | ICD-10-CM

## 2013-11-01 DIAGNOSIS — M79609 Pain in unspecified limb: Secondary | ICD-10-CM

## 2013-11-01 DIAGNOSIS — Q828 Other specified congenital malformations of skin: Secondary | ICD-10-CM

## 2013-11-01 NOTE — Progress Notes (Signed)
   Subjective:    Patient ID: Megan [name], female    DOB: 1946/06/05, 68 y.o.   MRN: [id number]  HPI Comments: "I am here to pick up my shoes but also can he just check one of my toes"  Patient presents to pick up diabetic shoes and insoles. Instructions were given. Also, patient states that the left big toe feels like the skin is being pressed on, on the lateral side at nail.  Diabetes  Toe Pain       Review of Systems no new changes or findings     Objective:   Physical Exam Neurovascular status is intact DP postal for PT one over 4 bilateral refill timed 3-4 seconds. Epicritic and proprioceptive sensations diminished on [employer] testing consistent with diabetic neuropathy. Patient having pain in the lateral nail fold left hallux due to the overlapping second digit sitting on the nail fold. Some tube foam padding was applied to the second digit and several pads dispensed the patient help keep the toes separated. Patient is early hammertoe deformity with dorsal displacement or pre-dislocation type syndrome. No signs of infection no discharge or drainage will maintain a toe separator as instructed at this time dispensed 1 pair shoes and 3 pairs of dual density Plastizote inlays the shoes in lace fit and contour well to the foot with full contact the arch. Patient is instructed in use in break in and adjustments over the next several weeks.       Assessment & Plan:  Assessment diabetes with neuropathy history keratoses and mycotic dystrophic brittle nails and reappointed 3 months for nail care and diabetic foot care at this time shoes and custom insoles dispensed with use wear instructions followup properly as mentioned advised to contact us in changes or exacerbations or difficulties in the interim. Assessment patient is a hammertoe deformities digital contractures with history keratoses overlapping digits patient also is dystrophic friable mycotic nails on the presence of  diabetes.  Harriet Masson DPM

## 2013-11-01 NOTE — Patient Instructions (Signed)
Diabetes and Foot Care Diabetes may cause you to have problems because of poor blood supply (circulation) to your feet and legs. This may cause the skin on your feet to become thinner, break easier, and heal more slowly. Your skin may become dry, and the skin may peel and crack. You may also have nerve damage in your legs and feet causing decreased feeling in them. You may not notice minor injuries to your feet that could lead to infections or more serious problems. Taking care of your feet is one of the most important things you can do for yourself.  HOME CARE INSTRUCTIONS  Wear shoes at all times, even in the house. Do not go barefoot. Bare feet are easily injured.  Check your feet daily for blisters, cuts, and redness. If you cannot see the bottom of your feet, use a mirror or ask someone for help.  Wash your feet with warm water (do not use hot water) and mild soap. Then pat your feet and the areas between your toes until they are completely dry. Do not soak your feet as this can dry your skin.  Apply a moisturizing lotion or petroleum jelly (that does not contain alcohol and is unscented) to the skin on your feet and to dry, brittle toenails. Do not apply lotion between your toes.  Trim your toenails straight across. Do not dig under them or around the cuticle. File the edges of your nails with an emery board or nail file.  Do not cut corns or calluses or try to remove them with medicine.  Wear clean socks or stockings every day. Make sure they are not too tight. Do not wear knee-high stockings since they may decrease blood flow to your legs.  Wear shoes that fit properly and have enough cushioning. To break in new shoes, wear them for just a few hours a day. This prevents you from injuring your feet. Always look in your shoes before you put them on to be sure there are no objects inside.  Do not cross your legs. This may decrease the blood flow to your feet.  If you find a minor scrape,  cut, or break in the skin on your feet, keep it and the skin around it clean and dry. These areas may be cleansed with mild soap and water. Do not cleanse the area with peroxide, alcohol, or iodine.  When you remove an adhesive bandage, be sure not to damage the skin around it.  If you have a wound, look at it several times a day to make sure it is healing.  Do not use heating pads or hot water bottles. They may burn your skin. If you have lost feeling in your feet or legs, you may not know it is happening until it is too late.  Make sure your health care provider performs a complete foot exam at least annually or more often if you have foot problems. Report any cuts, sores, or bruises to your health care provider immediately. SEEK MEDICAL CARE IF:   You have an injury that is not healing.  You have cuts or breaks in the skin.  You have an ingrown nail.  You notice redness on your legs or feet.  You feel burning or tingling in your legs or feet.  You have pain or cramps in your legs and feet.  Your legs or feet are numb.  Your feet always feel cold. SEEK IMMEDIATE MEDICAL CARE IF:   There is increasing redness,   swelling, or pain in or around a wound.  There is a red line that goes up your leg.  Pus is coming from a wound.  You develop a fever or as directed by your health care provider.  You notice a bad smell coming from an ulcer or wound. Document Released: 08/12/2000 Document Revised: 04/17/2013 Document Reviewed: 01/22/2013 [employer] Patient Information 2014 [employer].  

## 2013-12-24 ENCOUNTER — Ambulatory Visit (INDEPENDENT_AMBULATORY_CARE_PROVIDER_SITE_OTHER): Payer: Medicare Other

## 2013-12-24 VITALS — BP 157/74 | HR 56 | Resp 12

## 2013-12-24 DIAGNOSIS — M79609 Pain in unspecified limb: Secondary | ICD-10-CM

## 2013-12-24 DIAGNOSIS — E1149 Type 2 diabetes mellitus with other diabetic neurological complication: Secondary | ICD-10-CM

## 2013-12-24 DIAGNOSIS — B351 Tinea unguium: Secondary | ICD-10-CM

## 2013-12-24 DIAGNOSIS — E1142 Type 2 diabetes mellitus with diabetic polyneuropathy: Secondary | ICD-10-CM

## 2013-12-24 DIAGNOSIS — Q828 Other specified congenital malformations of skin: Secondary | ICD-10-CM

## 2013-12-24 DIAGNOSIS — E1140 Type 2 diabetes mellitus with diabetic neuropathy, unspecified: Secondary | ICD-10-CM

## 2013-12-24 NOTE — Patient Instructions (Signed)
Diabetes and Foot Care Diabetes may cause you to have problems because of poor blood supply (circulation) to your feet and legs. This may cause the skin on your feet to become thinner, break easier, and heal more slowly. Your skin may become dry, and the skin may peel and crack. You may also have nerve damage in your legs and feet causing decreased feeling in them. You may not notice minor injuries to your feet that could lead to infections or more serious problems. Taking care of your feet is one of the most important things you can do for yourself.  HOME CARE INSTRUCTIONS  Wear shoes at all times, even in the house. Do not go barefoot. Bare feet are easily injured.  Check your feet daily for blisters, cuts, and redness. If you cannot see the bottom of your feet, use a mirror or ask someone for help.  Wash your feet with warm water (do not use hot water) and mild soap. Then pat your feet and the areas between your toes until they are completely dry. Do not soak your feet as this can dry your skin.  Apply a moisturizing lotion or petroleum jelly (that does not contain alcohol and is unscented) to the skin on your feet and to dry, brittle toenails. Do not apply lotion between your toes.  Trim your toenails straight across. Do not dig under them or around the cuticle. File the edges of your nails with an emery board or nail file.  Do not cut corns or calluses or try to remove them with medicine.  Wear clean socks or stockings every day. Make sure they are not too tight. Do not wear knee-high stockings since they may decrease blood flow to your legs.  Wear shoes that fit properly and have enough cushioning. To break in new shoes, wear them for just a few hours a day. This prevents you from injuring your feet. Always look in your shoes before you put them on to be sure there are no objects inside.  Do not cross your legs. This may decrease the blood flow to your feet.  If you find a minor scrape,  cut, or break in the skin on your feet, keep it and the skin around it clean and dry. These areas may be cleansed with mild soap and water. Do not cleanse the area with peroxide, alcohol, or iodine.  When you remove an adhesive bandage, be sure not to damage the skin around it.  If you have a wound, look at it several times a day to make sure it is healing.  Do not use heating pads or hot water bottles. They may burn your skin. If you have lost feeling in your feet or legs, you may not know it is happening until it is too late.  Make sure your health care provider performs a complete foot exam at least annually or more often if you have foot problems. Report any cuts, sores, or bruises to your health care provider immediately. SEEK MEDICAL CARE IF:   You have an injury that is not healing.  You have cuts or breaks in the skin.  You have an ingrown nail.  You notice redness on your legs or feet.  You feel burning or tingling in your legs or feet.  You have pain or cramps in your legs and feet.  Your legs or feet are numb.  Your feet always feel cold. SEEK IMMEDIATE MEDICAL CARE IF:   There is increasing redness,   swelling, or pain in or around a wound.  There is a red line that goes up your leg.  Pus is coming from a wound.  You develop a fever or as directed by your health care provider.  You notice a bad smell coming from an ulcer or wound. Document Released: 08/12/2000 Document Revised: 04/17/2013 Document Reviewed: 01/22/2013 [employer] Patient Information 2014 [employer].  

## 2013-12-24 NOTE — Progress Notes (Signed)
   Subjective:    Patient ID: Megan [name], female    DOB: 02/28/1946, 68 y.o.   MRN: [id number]  HPI TOENAILS TRIM.    Review of Systems no new systemic changes or findings noted     Objective:   Physical Exam Neurovascular status is intact DP pulse two over four bilateral PT plus one over 4 bilateral capillary refill time 4 seconds epicritic and proprioceptive sensations intact and diminished bilateral on [employer] testing to the forefoot and plantar digits. Orthopedic biomechanical exam rectus foot type hammertoe deformities noted semirigid in nature there is otherwise rectus foot type noted nails thick brittle yellow and friable discolored 1 through 5 bilateral. Patient is also complaining of some dry skin in suggest using lotion without alcohol or fragrance baby autoimmune acceptable option. Lubriderm , Kerri lotion or similar products would be acceptable to       Assessment & Plan:  Assessment diabetes with history peripheral neuropathy decreased epicritic and proprioceptive sensations confirmed dystrophic from criptotic nails debrided x10 1 through 5 bilateral return for future palliative care every 3 months or as recommended also diffuse callous or pinch callus of the medial right heel is debrided we use a pumice stone lotion that area daily as recommended next  Harriet Masson DPM

## 2014-04-01 ENCOUNTER — Ambulatory Visit (INDEPENDENT_AMBULATORY_CARE_PROVIDER_SITE_OTHER): Payer: Medicare Other

## 2014-04-01 VITALS — BP 155/79 | HR 68 | Resp 18 | Ht 64.5 in | Wt 248.0 lb

## 2014-04-01 DIAGNOSIS — E1149 Type 2 diabetes mellitus with other diabetic neurological complication: Secondary | ICD-10-CM

## 2014-04-01 DIAGNOSIS — M79609 Pain in unspecified limb: Secondary | ICD-10-CM

## 2014-04-01 DIAGNOSIS — E1140 Type 2 diabetes mellitus with diabetic neuropathy, unspecified: Secondary | ICD-10-CM

## 2014-04-01 DIAGNOSIS — B351 Tinea unguium: Secondary | ICD-10-CM

## 2014-04-01 DIAGNOSIS — E1142 Type 2 diabetes mellitus with diabetic polyneuropathy: Secondary | ICD-10-CM

## 2014-04-01 DIAGNOSIS — S90129A Contusion of unspecified lesser toe(s) without damage to nail, initial encounter: Secondary | ICD-10-CM

## 2014-04-01 DIAGNOSIS — M79606 Pain in leg, unspecified: Secondary | ICD-10-CM

## 2014-04-01 DIAGNOSIS — M79674 Pain in right toe(s): Secondary | ICD-10-CM

## 2014-04-01 DIAGNOSIS — Q828 Other specified congenital malformations of skin: Secondary | ICD-10-CM

## 2014-04-01 NOTE — Progress Notes (Signed)
   Subjective:    Patient ID: Megan [name], female    DOB: Dec 10, 1945, 68 y.o.   MRN: [id number]  HPI Comments: N toe pain L right 5th toe D 2 weeks O  C sharp pain in the area, radiates up foot A ROM, pressure T none  Pt request debridement of 10 toenails.  Toe Pain       Review of Systems  All other systems reviewed and are negative.      Objective:   Physical Exam 68 year old white female well-developed well-nourished oriented x3 presents at this time with a new issue currently has a painful area of her fifth toe right foot is hemorrhage or trauma may have injured it at some point. Also has a darkened area of her left hallux nailbed consistent with a new contusion to that toe. Next  Lower extremity objective findings as follows vascular status is intact with pedal pulses palpable DP postal for PT +2/4 capillary fill time 3 seconds all digits epicritic and proprioceptive sensations are intact although slightly diminished in patient does complain of some paresthesias in her toes and feet. Orthopedic biomechanical exam reveals rectus foot type mild flexible digital contractures are noted rectus foot otherwise noted the fifth digit x-rays reveal no signs of fracture no osseous abnormality as a diffuse middle and distal phalanx on the fifth digit no fractures cyst or lesions are identified. Cannot rule out a strain or sprain of the fifth toe or contusion of that toe. This also correlates of fracture she may be wearing shoes that are hitting the ends of her toes her feet as her right hallux is also had history of contusion and breakage of the nail as well as a new contusion of the nailbed of the left hallux at this time to also nails thick criptotic brittle crumbly and friable with discoloration consistent with history of onychomycosis. Patient been applying topical antifungal although not as instructed only applied every other day rather than daily. No open wounds ulcerations  hematoma under the left hallux nail bed is identified on debridement.     Assessment & Plan:  Assessment diabetes with history peripheral neuropathy. At this time nails debrided thick brittle crumbly friable mycotic nails 1 through 5 bilateral debridement presence of pain in symptomology as well as diabetes and complications. X-rays reviewed at this time reveal no acute fracture however likely contusion to the fifth toe right foot as well as a contusion of the hallux with subungual hematoma on the left great toe. The debridement to evacuate the hematoma left hallux this is cleansed with hydroperoxide and Neosporin and Band-Aid dressing are applied. Patient advised to wash the toes and foot daily with soap and water apply Neosporin and Band-Aid. Also at this time stressed the application of topical nail antifungal twice daily.every other day. Reappointed 3 months for followup continued palliative care in the future as needed  Harriet Masson DPM

## 2014-04-01 NOTE — Patient Instructions (Addendum)
ANTIBACTERIAL SOAP INSTRUCTIONS  THE DAY AFTER PROCEDURE  Please follow the instructions your doctor has marked.   Shower as usual. Before getting out, place a drop of antibacterial liquid soap (Dial) on a wet, clean washcloth.  Gently wipe washcloth over affected area.  Afterward, rinse the area with warm water.  Blot the area dry with a soft cloth and cover with antibiotic ointment (neosporin, polysporin, bacitracin) and band aid or gauze and tape  Place 3-4 drops of antibacterial liquid soap in a quart of warm tap water.  Submerge foot into water for 20 minutes.  If bandage was applied after your procedure, leave on to allow for easy lift off, then remove and continue with soak for the remaining time.  Next, blot area dry with a soft cloth and cover with a bandage.  Apply other medications as directed by your doctor, such as cortisporin otic solution (eardrops) or neosporin antibiotic ointment  Wash and dry the toes daily as instructed apply Neosporin and Band-Aid to left great toe for the next 3-4 days or until resolved   Diabetes and Foot Care Diabetes may cause you to have problems because of poor blood supply (circulation) to your feet and legs. This may cause the skin on your feet to become thinner, break easier, and heal more slowly. Your skin may become dry, and the skin may peel and crack. You may also have nerve damage in your legs and feet causing decreased feeling in them. You may not notice minor injuries to your feet that could lead to infections or more serious problems. Taking care of your feet is one of the most important things you can do for yourself.  HOME CARE INSTRUCTIONS  Wear shoes at all times, even in the house. Do not go barefoot. Bare feet are easily injured.  Check your feet daily for blisters, cuts, and redness. If you cannot see the bottom of your feet, use a mirror or ask someone for help.  Wash your feet with warm water (do not use hot water) and mild soap.  Then pat your feet and the areas between your toes until they are completely dry. Do not soak your feet as this can dry your skin.  Apply a moisturizing lotion or petroleum jelly (that does not contain alcohol and is unscented) to the skin on your feet and to dry, brittle toenails. Do not apply lotion between your toes.  Trim your toenails straight across. Do not dig under them or around the cuticle. File the edges of your nails with an emery board or nail file.  Do not cut corns or calluses or try to remove them with medicine.  Wear clean socks or stockings every day. Make sure they are not too tight. Do not wear knee-high stockings since they may decrease blood flow to your legs.  Wear shoes that fit properly and have enough cushioning. To break in new shoes, wear them for just a few hours a day. This prevents you from injuring your feet. Always look in your shoes before you put them on to be sure there are no objects inside.  Do not cross your legs. This may decrease the blood flow to your feet.  If you find a minor scrape, cut, or break in the skin on your feet, keep it and the skin around it clean and dry. These areas may be cleansed with mild soap and water. Do not cleanse the area with peroxide, alcohol, or iodine.  When you remove an  adhesive bandage, be sure not to damage the skin around it.  If you have a wound, look at it several times a day to make sure it is healing.  Do not use heating pads or hot water bottles. They may burn your skin. If you have lost feeling in your feet or legs, you may not know it is happening until it is too late.  Make sure your health care provider performs a complete foot exam at least annually or more often if you have foot problems. Report any cuts, sores, or bruises to your health care provider immediately. SEEK MEDICAL CARE IF:   You have an injury that is not healing.  You have cuts or breaks in the skin.  You have an ingrown nail.  You  notice redness on your legs or feet.  You feel burning or tingling in your legs or feet.  You have pain or cramps in your legs and feet.  Your legs or feet are numb.  Your feet always feel cold. SEEK IMMEDIATE MEDICAL CARE IF:   There is increasing redness, swelling, or pain in or around a wound.  There is a red line that goes up your leg.  Pus is coming from a wound.  You develop a fever or as directed by your health care provider.  You notice a bad smell coming from an ulcer or wound. Document Released: 08/12/2000 Document Revised: 04/17/2013 Document Reviewed: 01/22/2013 [facility] Patient Information 2015 [place], [place]. This information is not intended to replace advice given to you by your health care provider. Make sure you discuss any questions you have with your health care provider.

## 2014-07-08 ENCOUNTER — Ambulatory Visit (INDEPENDENT_AMBULATORY_CARE_PROVIDER_SITE_OTHER): Payer: Medicare Other

## 2014-07-08 ENCOUNTER — Ambulatory Visit: Payer: Medicare Other

## 2014-07-08 DIAGNOSIS — E1140 Type 2 diabetes mellitus with diabetic neuropathy, unspecified: Secondary | ICD-10-CM

## 2014-07-08 DIAGNOSIS — B351 Tinea unguium: Secondary | ICD-10-CM

## 2014-07-08 DIAGNOSIS — M79673 Pain in unspecified foot: Secondary | ICD-10-CM

## 2014-07-08 NOTE — Progress Notes (Signed)
   Subjective:    Patient ID: Megan [name], female    DOB: [birth year], 68 y.o.   MRN: [id number]  HPI  Pt presents for nail debridement  Review of Systems no new findings or systemic changes noted. Patient continues to have some pain along the fifth toe she hurts her sometimes when she's sleeping or different times feels like is been broken however previous visit confirm no fracture he continues to have diabetic neuropathic pain     Objective:   Physical Exam Lower extremity objective findings reveal pedal pulses palpable DP +2 PT +2 over 4 bilateral capillary fill time 3 seconds epicritic sensations diminished the forefoot and digits there is some paresthesia in her toes possibly even some phantom pain in the fifth toe no signs of fracture or osseous abnormalities are noted mild digital contractures noted 2 through 5. Nails thick brittle crumbly friable dystrophic brittle painful tender both on palpation and with enclosed shoe wear. Continue applying a topical antifungal       Assessment & Plan:  Assessment this time his diabetes with history peripheral neuropathy thick friable brittle crumbly for dystrophic nails are debrided and the presence of pain tenderness symptomology and onychomycosis painful mycotic nails debrided 10 return for future diabetic foot palliative nail care every 3 months as recommended next  Harriet Masson DPM

## 2014-07-09 ENCOUNTER — Ambulatory Visit
Admission: RE | Admit: 2014-07-09 | Discharge: 2014-07-09 | Disposition: A | Discharge status: Home / Self Care | Payer: Medicare Other | Source: Ambulatory Visit | Attending: Family Medicine | Admitting: Family Medicine

## 2014-07-09 ENCOUNTER — Other Ambulatory Visit: Payer: Self-pay | Admitting: Family Medicine

## 2014-07-09 DIAGNOSIS — IMO0001 Reserved for inherently not codable concepts without codable children: Secondary | ICD-10-CM

## 2014-07-09 DIAGNOSIS — R05 Cough: Secondary | ICD-10-CM

## 2014-07-09 DIAGNOSIS — R059 Cough, unspecified: Secondary | ICD-10-CM

## 2014-07-09 DIAGNOSIS — K219 Gastro-esophageal reflux disease without esophagitis: Principal | ICD-10-CM

## 2014-07-15 ENCOUNTER — Encounter: Payer: Self-pay | Admitting: Internal Medicine

## 2014-07-21 ENCOUNTER — Other Ambulatory Visit: Payer: Medicare Other

## 2014-09-12 ENCOUNTER — Encounter: Payer: Self-pay | Admitting: Internal Medicine

## 2014-09-12 ENCOUNTER — Ambulatory Visit (INDEPENDENT_AMBULATORY_CARE_PROVIDER_SITE_OTHER): Payer: Medicare Other | Admitting: Internal Medicine

## 2014-09-12 VITALS — BP 152/94 | HR 72 | Ht 64.0 in | Wt 246.1 lb

## 2014-09-12 DIAGNOSIS — R197 Diarrhea, unspecified: Secondary | ICD-10-CM

## 2014-09-12 DIAGNOSIS — K449 Diaphragmatic hernia without obstruction or gangrene: Secondary | ICD-10-CM

## 2014-09-12 DIAGNOSIS — K219 Gastro-esophageal reflux disease without esophagitis: Secondary | ICD-10-CM

## 2014-09-12 MED ORDER — PANTOPRAZOLE SODIUM 40 MG PO TBEC: 40.0000 mg | DELAYED_RELEASE_TABLET | Freq: Every day | ORAL | Status: DC

## 2014-09-12 NOTE — Patient Instructions (Addendum)
We have sent the following medications to your pharmacy for you to pick up at your convenience: Pantoprazole  You have been scheduled for an Upper GI Series at [facility]. Your appointment is on 09/19/14 at 11:00am. Please arrive 15 minutes prior to your test for registration. Make sure not to eat or drink anything after midnight on the night before your test. If you need to reschedule, please call radiology at [phone]. ________________________________________________________________ An upper GI series uses x rays to help diagnose problems of the upper GI tract, which includes the esophagus, stomach, and duodenum. The duodenum is the first part of the small intestine. An upper GI series is conducted by a radiology technologist or a radiologist-a doctor who specializes in x-ray imaging-at a hospital or outpatient center. While sitting or standing in front of an x-ray machine, the patient drinks barium liquid, which is often white and has a chalky consistency and taste. The barium liquid coats the lining of the upper GI tract and makes signs of disease show up more clearly on x rays. X-ray video, called fluoroscopy, is used to view the barium liquid moving through the esophagus, stomach, and duodenum. Additional x rays and fluoroscopy are performed while the patient lies on an x-ray table. To fully coat the upper GI tract with barium liquid, the technologist or radiologist may press on the abdomen or ask the patient to change position. Patients hold still in various positions, allowing the technologist or radiologist to take x rays of the upper GI tract at different angles. If a technologist conducts the upper GI series, a radiologist will later examine the images to look for problems.  This test typically takes about 1 hour to complete. __________________________________________________________________  I appreciate the opportunity to care for you. Silvano Rusk, M.D., [facility]

## 2014-09-12 NOTE — Progress Notes (Signed)
Subjective:    Patient ID: Megan [name], female    DOB: [birth year], 69 y.o.   MRN: [id number]  HPI The patient is here at the request of her primary care provider Dr. Brigitte Pulse because of heartburn symptoms. She has years of reflux. She had been taking Zegerid for a number of years with started to have breakthrough symptoms at the end of last year. Dexilant samples were given but she developed terrible diarrhea. However she also started leflunomide at the same time in the fall of 2015 it seems, at the direction of Dr. Ouida Sills since methotrexate had not helped her arthritis after 1 year treatment course. He stopped the Dexilant and went to  over-the-counter Nexium 20 mg daily but continues to have symptoms of heartburn and diarrhea though it is less. A C. difficile PCR was negative. Dr. Brigitte Pulse performed a chest x-ray which showed a hiatal hernia. She does not have any dysphagia. Her weight is been stable overall it seems. She has one cup of coffee a day no tobacco. She has a epigastric pain it's fairly consistent on most days. It's a dull or sharp pain. There is burning in the chest consistent with heartburn. She is also used Alka-Seltzer and more than one Nexium a day without particular relief. Meloxicam was held. It didn't seem to make a difference either. She had a normal CBC and a negative H. pylori antigen. GI review of systems is otherwise negative. Apparently she's had an EGD in the past she thinks though I don't have that on her old records today.  Allergies  Allergen Reactions  . Aspirin Swelling  . Codeine Nausea And Vomiting  . Fish Oil Diarrhea  . Lisinopril Cough  . Sulfa Antibiotics Nausea And Vomiting  . Vicodin [Hydrocodone-Acetaminophen] Nausea And Vomiting  . Camphor Dermatitis  . Penicillins Rash    Elevated blood pressure   Outpatient Prescriptions Prior to Visit  Medication Sig Dispense Refill  . atenolol-chlorthalidone (TENORETIC) 50-25 MG per tablet Take 1 tablet by mouth  daily.    [name] gabapentin (NEURONTIN) 100 MG capsule Take 100 mg by mouth 3 (three) times daily.    [name] leflunomide (ARAVA) 20 MG tablet Take 20 mg by mouth daily.    [name] losartan (COZAAR) 50 MG tablet Take 100 mg by mouth daily.     . Melatonin 5 MG TABS Take 5 mg by mouth at bedtime.    . meloxicam (MOBIC) 15 MG tablet Take 15 mg by mouth daily.    . simvastatin (ZOCOR) 20 MG tablet Take 20 mg by mouth at bedtime. Takes 1/2 tablet     . esomeprazole (NEXIUM) 20 MG capsule Take 20 mg by mouth daily at 12 noon.     No facility-administered medications prior to visit.   Past Medical History  Diagnosis Date  . Osteoarthritis   . Allergy     bee stings  . Cataract     had surgery   . GERD (gastroesophageal reflux disease)   . Hyperlipidemia   . Hypertension   . Glaucoma   . Knee bursitis   . Sleep apnea   . Obesity   . Diabetes mellitus without complication     prediabetes diet controlled  . Migraines   . Diverticulosis   . Inflammatory arthritis   . Asthmatic bronchitis    Past Surgical History  Procedure Laterality Date  . Colonoscopy    . Partial hysterectomy      Left an ovary  . Cataract extraction  w/ intraocular lens  implant, bilateral Bilateral     2004 left, 2008 right  . Glaucoma surgery Bilateral     and laser surgery, 2004 left, 2008 right  . Carpal tunnel release Left     left wrist  . Excision morton's neuroma Left     left foot  . Toenail avulsion Right    History   Social History  . Marital Status: Married    Spouse Name: Lynnae Sandhoff    Number of Children: 0  . Years of Education: N/A   Occupational History  . retired    Social History Main Topics  . Smoking status: Former Smoker    Types: Cigarettes    Quit date: 08/30/1971  . Smokeless tobacco: Never Used  . Alcohol Use: No  . Drug Use: No              Social History Narrative   She is married no children retired.   Family History  Problem Relation Age of Onset  . Colon polyps Maternal  Grandfather   . Colon cancer Maternal Grandfather   . Hypertension Mother   . Hypertension Father   . Asthma Brother   . Obesity Other   . Diabetes Sister     x 2  . Diabetes Father   . Diabetes Mother   . Diabetes Brother     prediabetes        Review of Systems Positive for back pain, joint pains with her arthritis, headaches, insomnia pedal edema and intermittent cough which she thinks is possibly related to her reflux. All other review of systems are negative or as per history of present illness.    Objective:   Physical Exam General:  Well-developed, well-nourished and in no acute distress - obese Eyes:  anicteric. ENT:   Mouth and posterior pharynx free of lesions.  Neck:   supple w/o thyromegaly or mass.  Lungs: Clear to auscultation bilaterally. Heart:  S1S2, no rubs, murmurs, gallops. Abdomen:  soft, mildly -tender epigatric , no hepatosplenomegaly, hernia, or mass and BS+.  Lymph:  no cervical or supraclavicular adenopathy. Extremities:   no edema rheumatoid changes of the hands noted Skin   no rash. Neuro:  A&O x 3.  Psych:  appropriate mood and  Affect.   Data Reviewed: Primary care notes. These are from November 2015. Normal CBC and negative H. pylori serology November 2015      Assessment & Plan:   1. Gastroesophageal reflux disease, esophagitis presence not specified   2. Hiatal hernia   3. Diarrhea - thought due to medication      1. Upper GI series will be scheduled to evaluate the hiatal hernia further. She may need a repeat upper endoscopy. 2. Trial of pantoprazole 40 mg daily instead of over-the-counter Nexium 3. She says she has her head of bed elevated 4. She has follow-up with Dr. Ouida Sills and she should review the possibility that the diarrhea is caused by leflunomide. Ashen if some of her other symptoms might be induced by that. It seems like her increasing GI symptoms started around the time she changed to that medication so I think it's  possible though may be difficult to sort out completely.  5. Further plans pending results of the upper GI series and above  I appreciate the opportunity to care for this patient. CC: SHAW,KIMBERLEE, MD And Dr. Tobie Lords

## 2014-09-19 ENCOUNTER — Ambulatory Visit (HOSPITAL_COMMUNITY): Payer: Medicare Other

## 2014-09-23 ENCOUNTER — Ambulatory Visit (HOSPITAL_COMMUNITY)
Admission: RE | Admit: 2014-09-23 | Discharge: 2014-09-23 | Disposition: A | Discharge status: Home / Self Care | Payer: Medicare Other | Source: Ambulatory Visit | Attending: Internal Medicine | Admitting: Internal Medicine

## 2014-09-23 DIAGNOSIS — K449 Diaphragmatic hernia without obstruction or gangrene: Secondary | ICD-10-CM | POA: Diagnosis not present

## 2014-09-23 DIAGNOSIS — K219 Gastro-esophageal reflux disease without esophagitis: Secondary | ICD-10-CM | POA: Diagnosis present

## 2014-09-23 NOTE — Progress Notes (Signed)
Quick Note:  Confirms hiatal hernia  Recommend she arrange for An EGD due to reflux despite PPI Please schedule if she is willing  ______

## 2014-10-09 ENCOUNTER — Ambulatory Visit (INDEPENDENT_AMBULATORY_CARE_PROVIDER_SITE_OTHER): Payer: Medicare Other | Admitting: Podiatry

## 2014-10-09 DIAGNOSIS — M778 Other enthesopathies, not elsewhere classified: Secondary | ICD-10-CM

## 2014-10-09 DIAGNOSIS — M79673 Pain in unspecified foot: Secondary | ICD-10-CM

## 2014-10-09 DIAGNOSIS — E1140 Type 2 diabetes mellitus with diabetic neuropathy, unspecified: Secondary | ICD-10-CM

## 2014-10-09 DIAGNOSIS — M7751 Other enthesopathy of right foot: Secondary | ICD-10-CM

## 2014-10-09 DIAGNOSIS — B351 Tinea unguium: Secondary | ICD-10-CM

## 2014-10-09 DIAGNOSIS — M779 Enthesopathy, unspecified: Secondary | ICD-10-CM

## 2014-10-11 NOTE — Progress Notes (Signed)
She presents today with a chief complaint of a painful fifth toe right foot and painful toenails bilateral.  Objective: Pulses are strongly palpable. Nails are thick yellow dystrophic onychomycotic. Painful toe right foot.  Assessment: Diabetes mellitus with pain in limb secondary to onychomycosis and capsulitis.  Plan: Injection today with dexamethasone and local anesthetic to the point of maximal tenderness. Also to rigid nails with through 5 bilateral covered service secondary to pain. Follow-up with her in 3 months.

## 2014-10-16 ENCOUNTER — Ambulatory Visit (AMBULATORY_SURGERY_CENTER): Payer: Self-pay

## 2014-10-16 VITALS — Ht 63.5 in | Wt 245.0 lb

## 2014-10-16 DIAGNOSIS — K219 Gastro-esophageal reflux disease without esophagitis: Secondary | ICD-10-CM

## 2014-10-16 NOTE — Progress Notes (Signed)
No allergies to eggs or soy No past problems with anesthesia No home oxygen No diet/weight loss meds  Has email  Emmi instructions given for endoscopy

## 2014-10-24 ENCOUNTER — Encounter: Payer: Medicare Other | Admitting: Internal Medicine

## 2014-11-28 ENCOUNTER — Telehealth: Payer: Self-pay | Admitting: Internal Medicine

## 2014-11-28 NOTE — Telephone Encounter (Signed)
Patient is rescheduled for 12/12/14 and pre-visit for 12/02/14

## 2014-12-02 ENCOUNTER — Ambulatory Visit (AMBULATORY_SURGERY_CENTER): Payer: Self-pay

## 2014-12-02 VITALS — Ht 63.5 in | Wt 250.8 lb

## 2014-12-02 DIAGNOSIS — K449 Diaphragmatic hernia without obstruction or gangrene: Secondary | ICD-10-CM

## 2014-12-02 NOTE — Progress Notes (Signed)
Per pt, no allergies to soy or egg products.Pt not taking any weight loss meds or using  O2 at home. 

## 2014-12-12 ENCOUNTER — Ambulatory Visit (AMBULATORY_SURGERY_CENTER): Payer: Medicare Other | Admitting: Internal Medicine

## 2014-12-12 ENCOUNTER — Encounter: Payer: Self-pay | Admitting: Internal Medicine

## 2014-12-12 VITALS — BP 157/74 | HR 82 | Temp 97.8°F | Resp 13

## 2014-12-12 DIAGNOSIS — K219 Gastro-esophageal reflux disease without esophagitis: Secondary | ICD-10-CM

## 2014-12-12 DIAGNOSIS — K449 Diaphragmatic hernia without obstruction or gangrene: Secondary | ICD-10-CM

## 2014-12-12 MED ORDER — OMEPRAZOLE-SODIUM BICARBONATE 20-1100 MG PO CAPS: 1.0000 | ORAL_CAPSULE | Freq: Every day | ORAL | Status: DC

## 2014-12-12 MED ORDER — SODIUM CHLORIDE 0.9 % IV SOLN: 500.0000 mL | INTRAVENOUS | Status: DC

## 2014-12-12 NOTE — Progress Notes (Signed)
Report to PACU, RN, vss, BBS= Clear.  

## 2014-12-12 NOTE — Patient Instructions (Addendum)
Other than the hiatal hernia, things look ok.  I want you to take an over the counter Zegerid (on your med list) at bedtime to see if that makes a difference. This is a good bedtime reflux medication and its not on your formulary so OTC is the way to go. Strict adherence to reflux diet, keep bed up and try to lose weight (I know that is hard but it can help).  If these measures do not make a difference after 2-3 months let me know by calling.  I appreciate the opportunity to care for you. Gatha Mayer, MD, FACG   YOU HAD AN ENDOSCOPIC PROCEDURE TODAY AT [facility]:   Refer to the procedure report that was given to you for any specific questions about what was found during the examination.  If the procedure report does not answer your questions, please call your gastroenterologist to clarify.  If you requested that your care partner not be given the details of your procedure findings, then the procedure report has been included in a sealed envelope for you to review at your convenience later.  YOU SHOULD EXPECT: Some feelings of bloating in the abdomen. Passage of more gas than usual.  Walking can help get rid of the air that was put into your GI tract during the procedure and reduce the bloating. If you had a lower endoscopy (such as a colonoscopy or flexible sigmoidoscopy) you may notice spotting of blood in your stool or on the toilet paper. If you underwent a bowel prep for your procedure, you may not have a normal bowel movement for a few days.  Please Note:  You might notice some irritation and congestion in your nose or some drainage.  This is from the oxygen used during your procedure.  There is no need for concern and it should clear up in a day or so.  SYMPTOMS TO REPORT IMMEDIATELY:   Following lower endoscopy (colonoscopy or flexible sigmoidoscopy):  Excessive amounts of blood in the stool  Significant tenderness or worsening of abdominal  pains  Swelling of the abdomen that is new, acute  Fever of 100F or higher   Following upper endoscopy (EGD)  Vomiting of blood or coffee ground material  New chest pain or pain under the shoulder blades  Painful or persistently difficult swallowing  New shortness of breath  Fever of 100F or higher  Black, tarry-looking stools  For urgent or emergent issues, a gastroenterologist can be reached at any hour by calling [phone].   DIET: Your first meal following the procedure should be a small meal and then it is ok to progress to your normal diet. Heavy or fried foods are harder to digest and may make you feel nauseous or bloated.  Likewise, meals heavy in dairy and vegetables can increase bloating.  Drink plenty of fluids but you should avoid alcoholic beverages for 24 hours.  ACTIVITY:  You should plan to take it easy for the rest of today and you should NOT DRIVE or use heavy machinery until tomorrow (because of the sedation medicines used during the test).    FOLLOW UP: Our staff will call the number listed on your records the next business day following your procedure to check on you and address any questions or concerns that you may have regarding the information given to you following your procedure. If we do not reach you, we will leave a message.  However, if you are feeling well and  you are not experiencing any problems, there is no need to return our call.  We will assume that you have returned to your regular daily activities without incident.  If any biopsies were taken you will be contacted by phone or by letter within the next 1-3 weeks.  Please call us at [phone] if you have not heard about the biopsies in 3 weeks.    SIGNATURES/CONFIDENTIALITY: You and/or your care partner have signed paperwork which will be entered into your electronic medical record.  These signatures attest to the fact that that the information above on your After Visit Summary has been  reviewed and is understood.  Full responsibility of the confidentiality of this discharge information lies with you and/or your care-partner.

## 2014-12-12 NOTE — Op Note (Addendum)
[place]  [address] [place], [id number]   ENDOSCOPY PROCEDURE REPORT  PATIENT: Megan [name], Megan [name]  MR#: [id number] BIRTHDATE: [birth year] , 69  yrs. old GENDER: female ENDOSCOPIST: Gatha Mayer, MD, [facility] PROCEDURE DATE:  12/12/2014 PROCEDURE:  EGD, diagnostic ASA CLASS:     Class III INDICATIONS:  heartburn.  despite PPI, hiatal hernia MEDICATIONS: Propofol 120 mg IV and Monitored anesthesia care TOPICAL ANESTHETIC: none  DESCRIPTION OF PROCEDURE: After the risks benefits and alternatives of the procedure were thoroughly explained, informed consent was obtained.  The LB [id number] endoscope was introduced through the mouth and advanced to the second portion of the duodenum , Without limitations.  The instrument was slowly withdrawn as the mucosa was fully examined.    1) 6 cm hiatal hernia 43-40 cm. 2) Otherwise normal EGD. Retroflexed views revealed a hiatal hernia.     The scope was then withdrawn from the patient and the procedure completed.  COMPLICATIONS: There were no immediate complications.  ENDOSCOPIC IMPRESSION: 1) 6 cm hiatal hernia 43-40 cm 2) Otherwise normal EGD  RECOMMENDATIONS: 1) Add OTC Zegerid at night 2) strict anti-reflux diet 3) weight loss if possible 4) If this fails to help after 2-3 months call back  - changed to REV 2 months   eSigned:  Gatha Mayer, MD, [facility] 12/12/2014 2:33 PM Revised: 12/12/2014 2:33 PM   CC:W. Lutricia Feil, MD and The Patient

## 2014-12-13 ENCOUNTER — Encounter (HOSPITAL_COMMUNITY): Payer: Self-pay | Admitting: Emergency Medicine

## 2014-12-13 DIAGNOSIS — M79604 Pain in right leg: Secondary | ICD-10-CM | POA: Diagnosis present

## 2014-12-13 DIAGNOSIS — E669 Obesity, unspecified: Secondary | ICD-10-CM | POA: Diagnosis not present

## 2014-12-13 DIAGNOSIS — E785 Hyperlipidemia, unspecified: Secondary | ICD-10-CM | POA: Insufficient documentation

## 2014-12-13 DIAGNOSIS — G43909 Migraine, unspecified, not intractable, without status migrainosus: Secondary | ICD-10-CM | POA: Insufficient documentation

## 2014-12-13 DIAGNOSIS — J45909 Unspecified asthma, uncomplicated: Secondary | ICD-10-CM | POA: Diagnosis not present

## 2014-12-13 DIAGNOSIS — Z791 Long term (current) use of non-steroidal anti-inflammatories (NSAID): Secondary | ICD-10-CM | POA: Diagnosis not present

## 2014-12-13 DIAGNOSIS — M1990 Unspecified osteoarthritis, unspecified site: Secondary | ICD-10-CM | POA: Insufficient documentation

## 2014-12-13 DIAGNOSIS — Z880 Allergy status to penicillin: Secondary | ICD-10-CM | POA: Diagnosis not present

## 2014-12-13 DIAGNOSIS — M7989 Other specified soft tissue disorders: Secondary | ICD-10-CM | POA: Diagnosis not present

## 2014-12-13 DIAGNOSIS — Z79899 Other long term (current) drug therapy: Secondary | ICD-10-CM | POA: Insufficient documentation

## 2014-12-13 DIAGNOSIS — I10 Essential (primary) hypertension: Secondary | ICD-10-CM | POA: Diagnosis not present

## 2014-12-13 DIAGNOSIS — Z9841 Cataract extraction status, right eye: Secondary | ICD-10-CM | POA: Insufficient documentation

## 2014-12-13 DIAGNOSIS — E119 Type 2 diabetes mellitus without complications: Secondary | ICD-10-CM | POA: Diagnosis not present

## 2014-12-13 DIAGNOSIS — Z7951 Long term (current) use of inhaled steroids: Secondary | ICD-10-CM | POA: Diagnosis not present

## 2014-12-13 DIAGNOSIS — K219 Gastro-esophageal reflux disease without esophagitis: Secondary | ICD-10-CM | POA: Diagnosis not present

## 2014-12-13 DIAGNOSIS — L03115 Cellulitis of right lower limb: Secondary | ICD-10-CM | POA: Diagnosis not present

## 2014-12-13 DIAGNOSIS — L03116 Cellulitis of left lower limb: Secondary | ICD-10-CM | POA: Insufficient documentation

## 2014-12-13 DIAGNOSIS — Z87891 Personal history of nicotine dependence: Secondary | ICD-10-CM | POA: Diagnosis not present

## 2014-12-13 LAB — CBC WITH DIFFERENTIAL/PLATELET
Basophils Absolute: 0 10*3/uL (ref 0.0–0.1)
Basophils Relative: 1 % (ref 0–1)
Eosinophils Absolute: 0.2 10*3/uL (ref 0.0–0.7)
Eosinophils Relative: 3 % (ref 0–5)
HEMATOCRIT: 34.1 % — AB (ref 36.0–46.0)
Hemoglobin: 11.3 g/dL — ABNORMAL LOW (ref 12.0–15.0)
Lymphocytes Relative: 22 % (ref 12–46)
Lymphs Abs: 1.9 10*3/uL (ref 0.7–4.0)
MCH: 27.5 pg (ref 26.0–34.0)
MCHC: 33.1 g/dL (ref 30.0–36.0)
MCV: 83 fL (ref 78.0–100.0)
MONO ABS: 0.6 10*3/uL (ref 0.1–1.0)
Monocytes Relative: 7 % (ref 3–12)
NEUTROS ABS: 5.8 10*3/uL (ref 1.7–7.7)
Neutrophils Relative %: 67 % (ref 43–77)
Platelets: 261 10*3/uL (ref 150–400)
RBC: 4.11 MIL/uL (ref 3.87–5.11)
RDW: 14 % (ref 11.5–15.5)
WBC: 8.5 10*3/uL (ref 4.0–10.5)

## 2014-12-13 LAB — COMPREHENSIVE METABOLIC PANEL
ALBUMIN: 3.3 g/dL — AB (ref 3.5–5.2)
ALT: 14 U/L (ref 0–35)
ANION GAP: 11 (ref 5–15)
AST: 17 U/L (ref 0–37)
Alkaline Phosphatase: 96 U/L (ref 39–117)
BUN: 21 mg/dL (ref 6–23)
CALCIUM: 8.3 mg/dL — AB (ref 8.4–10.5)
CO2: 22 mmol/L (ref 19–32)
Chloride: 100 mmol/L (ref 96–112)
Creatinine, Ser: 0.92 mg/dL (ref 0.50–1.10)
GFR calc Af Amer: 72 mL/min — ABNORMAL LOW (ref >90)
GFR calc non Af Amer: 62 mL/min — ABNORMAL LOW (ref >90)
GLUCOSE: 143 mg/dL — AB (ref 70–99)
Potassium: 4 mmol/L (ref 3.5–5.1)
SODIUM: 133 mmol/L — AB (ref 135–145)
TOTAL PROTEIN: 6.3 g/dL (ref 6.0–8.3)
Total Bilirubin: 0.3 mg/dL (ref 0.3–1.2)

## 2014-12-13 NOTE — ED Notes (Signed)
Pt. reports right lower leg pain radiating to lower toes onset yesterday with redness/swelling. No injury/ ambulatory . Denies fever or chills, respirations unlabored .

## 2014-12-14 ENCOUNTER — Ambulatory Visit (HOSPITAL_COMMUNITY)
Admission: RE | Admit: 2014-12-14 | Discharge: 2014-12-14 | Disposition: A | Discharge status: Home / Self Care | Payer: Medicare Other | Source: Ambulatory Visit | Attending: Emergency Medicine | Admitting: Emergency Medicine

## 2014-12-14 ENCOUNTER — Emergency Department (HOSPITAL_COMMUNITY)
Admission: EM | Admit: 2014-12-14 | Discharge: 2014-12-14 | Disposition: A | Discharge status: Home / Self Care | Payer: Medicare Other | Attending: Emergency Medicine | Admitting: Emergency Medicine

## 2014-12-14 DIAGNOSIS — L03119 Cellulitis of unspecified part of limb: Secondary | ICD-10-CM

## 2014-12-14 DIAGNOSIS — M7989 Other specified soft tissue disorders: Secondary | ICD-10-CM

## 2014-12-14 DIAGNOSIS — M79604 Pain in right leg: Secondary | ICD-10-CM | POA: Diagnosis not present

## 2014-12-14 DIAGNOSIS — M79609 Pain in unspecified limb: Secondary | ICD-10-CM | POA: Diagnosis not present

## 2014-12-14 MED ORDER — CLINDAMYCIN HCL 300 MG PO CAPS: 300.0000 mg | ORAL_CAPSULE | Freq: Four times a day (QID) | ORAL | Status: DC

## 2014-12-14 MED ORDER — TRAMADOL HCL 50 MG PO TABS: 50.0000 mg | ORAL_TABLET | Freq: Four times a day (QID) | ORAL | Status: DC | PRN

## 2014-12-14 MED ORDER — TRAMADOL HCL 50 MG PO TABS
50.0000 mg | ORAL_TABLET | Freq: Once | ORAL | Status: AC
  Administered 2014-12-14: 50 mg via ORAL
  Filled 2014-12-14: qty 1

## 2014-12-14 MED ORDER — CLINDAMYCIN PHOSPHATE 300 MG/50ML IV SOLN
300.0000 mg | Freq: Once | INTRAVENOUS | Status: AC
  Administered 2014-12-14: 300 mg via INTRAVENOUS
  Filled 2014-12-14: qty 50

## 2014-12-14 NOTE — Discharge Instructions (Signed)
You have a cellulitis. Please take the medicine as prescribed. We suspect that you have cellulitis. Please expect to get a call tomorrow for ultrasound of your leg to make sure there is no clot. As mentioned, you can come after 11 pm, and ask to be seen by me, to make sure redness is getting better.   Cellulitis Cellulitis is an infection of the skin and the tissue beneath it. The infected area is usually red and tender. Cellulitis occurs most often in the arms and lower legs.  CAUSES  Cellulitis is caused by bacteria that enter the skin through cracks or cuts in the skin. The most common types of bacteria that cause cellulitis are staphylococci and streptococci. SIGNS AND SYMPTOMS   Redness and warmth.  Swelling.  Tenderness or pain.  Fever. DIAGNOSIS  Your health care provider can usually determine what is wrong based on a physical exam. Blood tests may also be done. TREATMENT  Treatment usually involves taking an antibiotic medicine. HOME CARE INSTRUCTIONS   Take your antibiotic medicine as directed by your health care provider. Finish the antibiotic even if you start to feel better.  Keep the infected arm or leg elevated to reduce swelling.  Apply a warm cloth to the affected area up to 4 times per day to relieve pain.  Take medicines only as directed by your health care provider.  Keep all follow-up visits as directed by your health care provider. SEEK MEDICAL CARE IF:   You notice red streaks coming from the infected area.  Your red area gets larger or turns dark in color.  Your bone or joint underneath the infected area becomes painful after the skin has healed.  Your infection returns in the same area or another area.  You notice a swollen bump in the infected area.  You develop new symptoms.  You have a fever. SEEK IMMEDIATE MEDICAL CARE IF:   You feel very sleepy.  You develop vomiting or diarrhea.  You have a general ill feeling (malaise) with muscle  aches and pains. MAKE SURE YOU:   Understand these instructions.  Will watch your condition.  Will get help right away if you are not doing well or get worse. Document Released: 05/25/2005 Document Revised: 12/30/2013 Document Reviewed: 10/31/2011 [facility] Patient Information 2015 [place], [place]. This information is not intended to replace advice given to you by your health care provider. Make sure you discuss any questions you have with your health care provider.

## 2014-12-14 NOTE — Progress Notes (Signed)
VASCULAR LAB PRELIMINARY  PRELIMINARY  PRELIMINARY  PRELIMINARY  Bilateral lower extremity venous Dopplers completed.    Preliminary report:  There is no DVT or SVT noted in the bilateral lower extremities.   Jala Dundon, RVT 12/14/2014, 1:35 PM

## 2014-12-14 NOTE — ED Notes (Signed)
Patient presents with right lower leg swollen, red and warm.  Right foot also swollen red and warm to touch.  3+ pitting edema to foot.  States she has been putting cream on her leg but it has not helped.  Tonight her leg started burning really bad

## 2014-12-14 NOTE — ED Notes (Signed)
Discharge instructions and prescriptions given  Voiced understanding.

## 2014-12-14 NOTE — ED Provider Notes (Signed)
CSN: [id number]     Arrival date & time 12/13/14  2206 History  This chart was scribed for Varney Biles, MD by Evelene Croon, ED Scribe. This patient was seen in room D33C/D33C and the patient's care was started 1:42 AM.    Chief Complaint  Patient presents with  . Leg Pain    The history is provided by the patient. No language interpreter was used.     HPI Comments:  Megan [name] is a 70 y.o. female who presents to the Emergency Department complaining of burning, stinging pain  to her RLE that started 2 days ago. Pt notes pain has progressively worsened since onset. She reports associated redness overlying her RLE, swelling to right lower extremity that she first noticed a few weeks ago and mild swelling to her LLE for about 1-2 days.  She denies recent trauma/injury to the extremities. Pt states she is pre-diabetic but  denies h/o diabetic neuropathy.  She denies h/o active CA, h/o DVT/PE, recent long distance travel, recent surgey, and use of  hormone replacement therapy. She has taken ibuprofen with mild relief of pain.   Past Medical History  Diagnosis Date  . Osteoarthritis   . Allergy     bee stings  . Cataract     had surgery   . GERD (gastroesophageal reflux disease)   . Hyperlipidemia   . Hypertension   . Glaucoma     BIL  . Knee bursitis   . Sleep apnea   . Obesity   . Diabetes mellitus without complication     prediabetes diet controlled  . Migraines   . Diverticulosis   . Inflammatory arthritis   . Asthmatic bronchitis   . Broken arm 1957/2008    right   Past Surgical History  Procedure Laterality Date  . Colonoscopy    . Partial hysterectomy  1991    Left an ovary  . Cataract extraction w/ intraocular lens  implant, bilateral Bilateral     1999 left, 2008 right  . Glaucoma surgery Bilateral     and laser surgery, 2004 left, 2008 right  . Carpal tunnel release Left     left wrist  . Excision morton's neuroma Left 1978    left foot  . Toenail  avulsion Right     big toe   Family History  Problem Relation Age of Onset  . Colon polyps Maternal Grandfather   . Colon cancer Maternal Grandfather   . Hypertension Mother   . Hypertension Father   . Asthma Brother   . Obesity Other   . Diabetes Sister     x 2  . Diabetes Father   . Diabetes Mother   . Diabetes Brother     prediabetes   History  Substance Use Topics  . Smoking status: Former Smoker    Types: Cigarettes    Quit date: 08/30/1971  . Smokeless tobacco: Never Used  . Alcohol Use: No   OB History    No data available     Review of Systems  Cardiovascular: Positive for leg swelling.  Musculoskeletal: Positive for myalgias (BLE).  Skin: Positive for color change (Erythema).      Allergies  Aspirin; Bee pollen; Codeine; Fish oil; Lisinopril; Sulfa antibiotics; Vicodin; Camphor; and Penicillins  Home Medications   Prior to Admission medications   Medication Sig Start Date End Date Taking? Authorizing Provider  atenolol-chlorthalidone (TENORETIC) 50-25 MG per tablet Take 1 tablet by mouth daily.   Yes Historical Provider,  MD  gabapentin (NEURONTIN) 100 MG capsule Take 100 mg by mouth 3 (three) times daily.    Yes Historical Provider, MD  leflunomide (ARAVA) 20 MG tablet Take 20 mg by mouth daily.   Yes Historical Provider, MD  losartan (COZAAR) 50 MG tablet Take 50 mg by mouth daily.    Yes Historical Provider, MD  meloxicam (MOBIC) 15 MG tablet Take 15 mg by mouth daily.   Yes Historical Provider, MD  Omeprazole-Sodium Bicarbonate (ZEGERID) 20-1100 MG CAPS capsule Take 1 capsule by mouth at bedtime. 12/12/14  Yes Gatha Mayer, MD  pantoprazole (PROTONIX) 40 MG tablet Take 1 tablet (40 mg total) by mouth daily before breakfast. 09/12/14  Yes Gatha Mayer, MD  simvastatin (ZOCOR) 20 MG tablet Take 20 mg by mouth at bedtime.    Yes Historical Provider, MD  triamcinolone cream (KENALOG) 0.1 % Apply 1 application topically 3 (three) times daily.   Yes  Historical Provider, MD  clindamycin (CLEOCIN) 300 MG capsule Take 1 capsule (300 mg total) by mouth 4 (four) times daily. 12/14/14   Varney Biles, MD  traMADol (ULTRAM) 50 MG tablet Take 1 tablet (50 mg total) by mouth every 6 (six) hours as needed. 12/14/14   Nalaya Wojdyla, MD   BP 122/62 mmHg  Pulse 81  Temp(Src) 98.1 F (36.7 C) (Oral)  Resp 20  SpO2 96% Physical Exam  Constitutional: She is oriented to person, place, and time. She appears well-developed and well-nourished.  HENT:  Head: Normocephalic and atraumatic.  Cardiovascular: Normal rate.   Pulmonary/Chest: Effort normal.  Abdominal: She exhibits no distension.  Musculoskeletal: She exhibits edema.  RLE swollen compared to LLE Bilateral erythema over distal tibia of the foot right worse than left  Mild warmth to touch on the right foot. TTP over the right calf Pitting edema overlying right foot TTP over right foot Ankle ROM appears to be intact with no jouint erythema  Eversion and inversion appears intact on the right side   Neurological: She is alert and oriented to person, place, and time.  Skin: Skin is warm and dry. There is erythema.  Psychiatric: She has a normal mood and affect.  Nursing note and vitals reviewed.   ED Course  Procedures   DIAGNOSTIC STUDIES:  Oxygen Saturation is 97% on RA, normal by my interpretation.    COORDINATION OF CARE:  1:48 AM Will order outpatient [name] to r/o DVT and discharge with antibiotics. Discussed treatment plan with pt at bedside and pt agreed to plan.  Labs Review Labs Reviewed  CBC WITH DIFFERENTIAL/PLATELET - Abnormal; Notable for the following:    Hemoglobin 11.3 (*)    HCT 34.1 (*)    All other components within normal limits  COMPREHENSIVE METABOLIC PANEL - Abnormal; Notable for the following:    Sodium 133 (*)    Glucose, Bld 143 (*)    Calcium 8.3 (*)    Albumin 3.3 (*)    GFR calc non Af Amer 62 (*)    GFR calc Af Amer 72 (*)    All other components  within normal limits    Imaging Review No results found.   EKG Interpretation None      MDM   Final diagnoses:  Cellulitis of lower extremity, unspecified laterality  Right leg swelling    I personally performed the services described in this documentation, which was scribed in my presence. The recorded information has been reviewed and is accurate.  Pt with bilateral lower ext redness,  right worse than left. Right side is painful as well, and there is more edema. Suspect cellulitis. Pt however reports that the swelling has been present for 1 week, and that the pain and redness just started yday - which does get [name] concerned about DVT. No crepitus, and dont think this is a necrotising infection, and there is no trauma. Pt to be started on clinda, as she reportedly has anaphylaxis to penicillins, and will return for recheck tomorrow.     Varney Biles, MD 12/14/14 [phone]

## 2014-12-15 ENCOUNTER — Telehealth: Payer: Self-pay | Admitting: *Deleted

## 2014-12-15 NOTE — Telephone Encounter (Signed)
  Follow up Call-  Call back number 12/12/2014  Post procedure Call Back phone  # [phone]  Permission to leave phone message Yes     Patient questions:  Do you have a fever, pain , or abdominal swelling? No. Pain Score  0 *  Have you tolerated food without any problems? Yes.    Have you been able to return to your normal activities? Yes.    Do you have any questions about your discharge instructions: Diet   No. Medications  No. Follow up visit  No.  Do you have questions or concerns about your Care? No.  Actions: * If pain score is 4 or above: No action needed, pain <4.

## 2014-12-17 ENCOUNTER — Other Ambulatory Visit: Payer: Self-pay | Admitting: Family Medicine

## 2014-12-17 ENCOUNTER — Ambulatory Visit
Admission: RE | Admit: 2014-12-17 | Discharge: 2014-12-17 | Disposition: A | Discharge status: Home / Self Care | Payer: Medicare Other | Source: Ambulatory Visit | Attending: Family Medicine | Admitting: Family Medicine

## 2014-12-17 DIAGNOSIS — M7989 Other specified soft tissue disorders: Secondary | ICD-10-CM

## 2014-12-18 ENCOUNTER — Encounter: Payer: Self-pay | Admitting: Internal Medicine

## 2015-01-15 ENCOUNTER — Ambulatory Visit (INDEPENDENT_AMBULATORY_CARE_PROVIDER_SITE_OTHER): Payer: Medicare Other | Admitting: Podiatry

## 2015-01-15 DIAGNOSIS — B351 Tinea unguium: Secondary | ICD-10-CM | POA: Diagnosis not present

## 2015-01-15 DIAGNOSIS — M79673 Pain in unspecified foot: Secondary | ICD-10-CM | POA: Diagnosis not present

## 2015-01-15 DIAGNOSIS — R609 Edema, unspecified: Secondary | ICD-10-CM

## 2015-01-15 DIAGNOSIS — L03031 Cellulitis of right toe: Secondary | ICD-10-CM

## 2015-01-15 DIAGNOSIS — L02611 Cutaneous abscess of right foot: Secondary | ICD-10-CM

## 2015-01-15 MED ORDER — CLINDAMYCIN HCL 300 MG PO CAPS: 300.0000 mg | ORAL_CAPSULE | Freq: Four times a day (QID) | ORAL | Status: DC

## 2015-01-15 NOTE — Progress Notes (Signed)
She presents today with a chief complaint of a painful fifth toe right foot and painful toenails bilateral. She also has bilateral swelling of legs and feet, with pain along lateral border of right great toe  Objective: Pulses are strongly palpable. Nails are thick yellow dystrophic onychomycotic. Painful toe right foot.  Assessment: Diabetes mellitus with pain in limb secondary to onychomycosis and capsulitis.  Plan: Injection today with dexamethasone and local anesthetic to the point of maximal tenderness. Also to rigid nails with through 5 bilateral covered service secondary to pain. Follow-up with her in 3 months.

## 2015-01-22 ENCOUNTER — Other Ambulatory Visit: Payer: Self-pay | Admitting: Family Medicine

## 2015-01-22 DIAGNOSIS — R109 Unspecified abdominal pain: Secondary | ICD-10-CM

## 2015-02-03 ENCOUNTER — Ambulatory Visit
Admission: RE | Admit: 2015-02-03 | Discharge: 2015-02-03 | Disposition: A | Discharge status: Home / Self Care | Payer: Medicare Other | Source: Ambulatory Visit | Attending: Family Medicine | Admitting: Family Medicine

## 2015-02-03 DIAGNOSIS — R109 Unspecified abdominal pain: Secondary | ICD-10-CM

## 2015-02-03 MED ORDER — IOPAMIDOL (ISOVUE-300) INJECTION 61%
125.0000 mL | Freq: Once | INTRAVENOUS | Status: AC | PRN
  Administered 2015-02-03: 125 mL via INTRAVENOUS

## 2015-03-13 ENCOUNTER — Other Ambulatory Visit: Payer: Self-pay

## 2015-03-13 ENCOUNTER — Other Ambulatory Visit (HOSPITAL_COMMUNITY): Payer: Self-pay | Admitting: Family Medicine

## 2015-03-13 ENCOUNTER — Ambulatory Visit (HOSPITAL_COMMUNITY): Payer: Medicare Other | Attending: Cardiology

## 2015-03-13 DIAGNOSIS — I351 Nonrheumatic aortic (valve) insufficiency: Secondary | ICD-10-CM | POA: Diagnosis not present

## 2015-03-13 DIAGNOSIS — R011 Cardiac murmur, unspecified: Secondary | ICD-10-CM

## 2015-03-26 ENCOUNTER — Ambulatory Visit (INDEPENDENT_AMBULATORY_CARE_PROVIDER_SITE_OTHER): Payer: Medicare Other | Admitting: Podiatry

## 2015-03-26 DIAGNOSIS — B351 Tinea unguium: Secondary | ICD-10-CM

## 2015-03-26 DIAGNOSIS — M79673 Pain in unspecified foot: Secondary | ICD-10-CM

## 2015-03-26 DIAGNOSIS — R609 Edema, unspecified: Secondary | ICD-10-CM

## 2015-03-26 NOTE — Progress Notes (Signed)
Patient ID: Megan [name], female   DOB: 11-24-1945, 69 y.o.   MRN: [id number] Complaint:  Visit Type: Patient returns to my office for continued preventative foot care services. Complaint: Patient states" my nails have grown long and thick and become painful to walk and wear shoes" Patient has been diagnosed with DM . The patient presents for preventative foot care services. No changes to ROS  Podiatric Exam: Vascular: dorsalis pedis and posterior tibial pulses are not  palpable bilateral due to swelling both feet.. Capillary return is immediate. Temperature gradient is WNL. Skin turgor WNL  Sensorium: Normal Semmes Weinstein monofilament test. Normal tactile sensation bilaterally. Nail Exam: Pt has thick disfigured discolored nails with subungual debris noted bilateral entire nail hallux through fifth toenails Ulcer Exam: There is no evidence of ulcer or pre-ulcerative changes or infection. Orthopedic Exam: Muscle tone and strength are WNL. No limitations in general ROM. No crepitus or effusions noted. Foot type and digits show no abnormalities. Bony prominences are unremarkable. Skin: No Porokeratosis. No infection or ulcers  Diagnosis:  Onychomycosis, , Pain in right toe, pain in left toes  Treatment & Plan Procedures and Treatment: Consent by patient was obtained for treatment procedures. The patient understood the discussion of treatment and procedures well. All questions were answered thoroughly reviewed. Debridement of mycotic and hypertrophic toenails, 1 through 5 bilateral and clearing of subungual debris. No ulceration, no infection noted.  Return Visit-Office Procedure: Patient instructed to return to the office for a follow up visit 3 months for continued evaluation and treatment.

## 2015-04-06 ENCOUNTER — Other Ambulatory Visit: Payer: Medicare Other

## 2015-04-06 ENCOUNTER — Ambulatory Visit (INDEPENDENT_AMBULATORY_CARE_PROVIDER_SITE_OTHER): Payer: Medicare Other | Admitting: Neurology

## 2015-04-06 ENCOUNTER — Encounter: Payer: Self-pay | Admitting: Neurology

## 2015-04-06 VITALS — BP 110/78 | HR 69 | Ht 64.0 in | Wt 246.1 lb

## 2015-04-06 DIAGNOSIS — E1140 Type 2 diabetes mellitus with diabetic neuropathy, unspecified: Secondary | ICD-10-CM | POA: Diagnosis not present

## 2015-04-06 DIAGNOSIS — G609 Hereditary and idiopathic neuropathy, unspecified: Secondary | ICD-10-CM | POA: Diagnosis not present

## 2015-04-06 LAB — VITAMIN B12: Vitamin B-12: 298 pg/mL (ref 211–911)

## 2015-04-06 MED ORDER — GABAPENTIN 300 MG PO CAPS: 300.0000 mg | ORAL_CAPSULE | Freq: Three times a day (TID) | ORAL | Status: DC

## 2015-04-06 NOTE — Patient Instructions (Addendum)
Your symptoms are most likely due to peripheral neuropathy.  1.  Increase Gabapentin 300mg  three times daily for pain 2.  Start taking vitamin B12 105mcg daily 3.  Please call your insurance company and be sure nerve conduction studies/electromyography will be covered 4.  Check blood work 5.  Return to clinic in 3 months   [facility] (EMG/NCS) INSTRUCTIONS  How to Prepare The neurologist conducting the EMG will need to know if you have certain medical conditions. Tell the neurologist and other EMG lab personnel if you: . Have a pacemaker or any other electrical medical device . Take blood-thinning medications . Have hemophilia, a blood-clotting disorder that causes prolonged bleeding Bathing Take a shower or bath shortly before your exam in order to remove oils from your skin. Don't apply lotions or creams before the exam.  What to Expect You'll likely be asked to change into a hospital gown for the procedure and lie down on an examination table. The following explanations can help you understand what will happen during the exam.  . Electrodes. The neurologist or a technician places surface electrodes at various locations on your skin depending on where you're experiencing symptoms. Or the neurologist may insert needle electrodes at different sites depending on your symptoms.  . Sensations. The electrodes will at times transmit a tiny electrical current that you may feel as a twinge or spasm. The needle electrode may cause discomfort or pain that usually ends shortly after the needle is removed. If you are concerned about discomfort or pain, you may want to talk to the neurologist about taking a short break during the exam.  . Instructions. During the needle EMG, the neurologist will assess whether there is any spontaneous electrical activity when the muscle is at rest - activity that isn't present in healthy muscle tissue - and the degree of activity when you  slightly contract the muscle.  He or she will give you instructions on resting and contracting a muscle at appropriate times. Depending on what muscles and nerves the neurologist is examining, he or she may ask you to change positions during the exam.  After your EMG You may experience some temporary, minor bruising where the needle electrode was inserted into your muscle. This bruising should fade within several days. If it persists, contact your primary care doctor.

## 2015-04-06 NOTE — Progress Notes (Signed)
[facility] Clinic Note - Initial Visit   Date: 04/06/2015  CYNAI SKEENS MRN: [id number] DOB: 29-Jun-1946   Dear Dr. Brigitte Pulse:  Thank you for your kind referral of Megan [name] for consultation of neuropathy. Although her history is well known to you, please allow [name] to reiterate it for the purpose of our medical record. The patient was accompanied to the clinic by husband who also provides collateral information.     History of Present Illness: Megan [name] is a 69 y.o. right-handed Caucasian female with inflammatory arthritis, hypertension, GERD, hyperlipidemia, diet-controlled diabetes mellitus presenting for evaluation of bilateral leg pain.    Starting in January 2016, she began having bilateral leg pain described as burning and stinging pain. Pain is constant but will intermittent increase in intensity.  Pain started above her ankles and over time has involved the entire lower leg and foot.  When symptoms started, she developed sores over her legs and swelling of the legs. There was concern of DVT, so she has ultrasound of the legs which return normal.  She tried wearing compression stockings and is using a topical ointment, neither which provide much relief.   She endorses low back pain which is worse with prolonged standing.   Denies weakness, imbalance, or pain involving the hands.   She recently increased her gabapentin to $RemoveBefor'300mg'IjpUlxPRAdNn$  twice daily.  She denies any side effects or benefit.    Out-side paper records, electronic medical record, and images have been reviewed where available and summarized as:   Labs 01/30/2015:  ESR 3, vitamin B12 256 HbA1c 6.5  MRI lumbar spine wo contrast 09/07/2004: 1. Left foraminal and extraforaminal disk protrusion on the left at L2-3 may affect the left L2 nerve root and could be responsible for the patient's radicular symptoms. Recommend clinical correlation.  2. Right foraminal disk protrusion at L3-4 which  could affect the right L3 nerve root.  3. Annular rent and a shallow central and medial foraminal disk protrusion on the right at L4-5. No definite mass effect on the right L4 nerve root.  4. Mild hypertrophic facet changes in the lower lumbar spine.   Past Medical History  Diagnosis Date  . Osteoarthritis   . Allergy     bee stings  . Cataract     had surgery   . GERD (gastroesophageal reflux disease)   . Hyperlipidemia   . Hypertension   . Glaucoma     BIL  . Knee bursitis   . Sleep apnea   . Obesity   . Diabetes mellitus without complication     prediabetes diet controlled  . Migraines   . Diverticulosis   . Inflammatory arthritis   . Asthmatic bronchitis   . Broken arm 1957/2008    right    Past Surgical History  Procedure Laterality Date  . Colonoscopy    . Partial hysterectomy  1991    Left an ovary  . Cataract extraction w/ intraocular lens  implant, bilateral Bilateral     1999 left, 2008 right  . Glaucoma surgery Bilateral     and laser surgery, 2004 left, 2008 right  . Carpal tunnel release Left     left wrist  . Excision morton's neuroma Left 1978    left foot  . Toenail avulsion Right     big toe     Medications:  Outpatient Encounter Prescriptions as of 04/06/2015  Medication Sig Note  . (No Medication Selected) Apply 1 application topically. Carb/gaba/orph/pent/topi   .  atenolol-chlorthalidone (TENORETIC) 50-25 MG per tablet Take 1 tablet by mouth daily.   . furosemide (LASIX) 80 MG tablet TAKE 1 TABLET ONCE A DAY FOR SWELLING ORALLY 04/06/2015: Received from: External Pharmacy  . gabapentin (NEURONTIN) 100 MG capsule Take 300 mg by mouth 2 (two) times daily.    [name] GEL-KAM 0.4 % GEL daily. as directed 03/26/2015: Received from: External Pharmacy  . KLOR-CON M20 20 MEQ tablet Take 20 mEq by mouth daily. 04/06/2015: Received from: External Pharmacy  . leflunomide (ARAVA) 20 MG tablet Take 20 mg by mouth daily.   [name] losartan (COZAAR) 100 MG tablet TAKE  1 TABLET ONCE A DAY ORALLY 90 DAYS 04/06/2015: Received from: External Pharmacy  . meloxicam (MOBIC) 15 MG tablet Take 15 mg by mouth daily.   Earney Navy Bicarbonate (ZEGERID) 20-1100 MG CAPS capsule Take 1 capsule by mouth at bedtime.   . pantoprazole (PROTONIX) 40 MG tablet Take 1 tablet (40 mg total) by mouth daily before breakfast.   . simvastatin (ZOCOR) 40 MG tablet TAKE 1 TABLET EVERY EVENING ONCE A DAY ORALLY 04/06/2015: Received from: External Pharmacy  . traMADol (ULTRAM) 50 MG tablet Take 1 tablet (50 mg total) by mouth every 6 (six) hours as needed.   . triamcinolone cream (KENALOG) 0.1 % Apply 1 application topically 3 (three) times daily.   . [DISCONTINUED] clindamycin (CLEOCIN) 300 MG capsule Take 1 capsule (300 mg total) by mouth 4 (four) times daily.   . [DISCONTINUED] furosemide (LASIX) 20 MG tablet TAKE 4 TABLETS ONCE A DAY FOR SWELLING 04/06/2015: Received from: External Pharmacy  . [DISCONTINUED] losartan (COZAAR) 50 MG tablet Take 50 mg by mouth daily.    . [DISCONTINUED] simvastatin (ZOCOR) 20 MG tablet Take 20 mg by mouth at bedtime.     No facility-administered encounter medications on file as of 04/06/2015.     Allergies:  Allergies  Allergen Reactions  . Aspirin Swelling  . Bee Pollen     Extreme swelling due to bee stings  . Codeine Nausea And Vomiting  . Fish Oil Diarrhea  . Lisinopril Cough  . Sulfa Antibiotics Nausea And Vomiting  . Vicodin [Hydrocodone-Acetaminophen] Nausea And Vomiting  . Camphor Dermatitis  . Penicillins Rash    Elevated blood pressure    Family History: Family History  Problem Relation Age of Onset  . Colon polyps Maternal Grandfather   . Colon cancer Maternal Grandfather   . Hypertension Mother   . Hypertension Father   . Asthma Brother   . Obesity Other   . Diabetes Sister     x 2  . Diabetes Father   . Diabetes Mother   . Diabetes Brother     prediabetes    Social History: History  Substance Use Topics  .  Smoking status: Former Smoker    Types: Cigarettes    Quit date: 08/30/1971  . Smokeless tobacco: Never Used  . Alcohol Use: No   History   Social History Narrative   She is married no children retired.  Lives with husband in a one story home.  Retired from Geologist, engineering for [employer].    Review of Systems:  CONSTITUTIONAL: No fevers, chills, night sweats, or weight loss.   EYES: No visual changes or eye pain ENT: No hearing changes.  No history of nose bleeds.   RESPIRATORY: No cough, wheezing and shortness of breath.   CARDIOVASCULAR: Negative for chest pain, and palpitations.   GI: Negative for abdominal discomfort, blood in stools or black stools.  No recent change in bowel habits.   GU:  No history of incontinence.   MUSCLOSKELETAL: +history of joint pain +swelling.  No myalgias.   SKIN: Negative for lesions, rash, and itching.   HEMATOLOGY/ONCOLOGY: Negative for prolonged bleeding, bruising easily, and swollen nodes.  No history of cancer.   ENDOCRINE: Negative for cold or heat intolerance, polydipsia or goiter.   PSYCH:  No depression or anxiety symptoms.   NEURO: As Above.   Vital Signs:  BP 110/78 mmHg  Pulse 69  Ht 5' 4"  (1.626 m)  Wt 246 lb 1 oz (111.613 kg)  BMI 42.22 kg/m2  SpO2 96%   General Medical Exam:   General:  Well appearing, comfortable.   Eyes/ENT: see cranial nerve examination.   Neck: No masses appreciated.  Full range of motion without tenderness.  No carotid bruits. Respiratory:  Clear to auscultation, good air entry bilaterally.   Cardiac:  Regular rate and rhythm, no murmur.   Extremities:  No deformities, edema, or skin discoloration.  Skin:  No rashes or lesions.  Neurological Exam: MENTAL STATUS including orientation to time, place, person, recent and remote memory, attention span and concentration, language, and fund of knowledge is normal.  Speech is not dysarthric.  CRANIAL NERVES: II:  No visual field defects.  Unremarkable fundi.     III-IV-VI: Pupils equal round and reactive to light.  Normal conjugate, extra-ocular eye movements in all directions of gaze.  No nystagmus.  No ptosis.   V:  Normal facial sensation.    VII:  Normal facial symmetry and movements.  No pathologic facial reflexes.  VIII:  Normal hearing and vestibular function.   IX-X:  Normal palatal movement.   XI:  Normal shoulder shrug and head rotation.   XII:  Normal tongue strength and range of motion, no deviation or fasciculation.  MOTOR:  No atrophy, fasciculations or abnormal movements.  No pronator drift.  Tone is normal.    Right Upper Extremity:    Left Upper Extremity:    Deltoid  5/5   Deltoid  5/5   Biceps  5/5   Biceps  5/5   Triceps  5/5   Triceps  5/5   Wrist extensors  5/5   Wrist extensors  5/5   Wrist flexors  5/5   Wrist flexors  5/5   Finger extensors  5/5   Finger extensors  5/5   Finger flexors  5/5   Finger flexors  5/5   Dorsal interossei  5/5   Dorsal interossei  5/5   Abductor pollicis  5/5   Abductor pollicis  5/5   Tone (Ashworth scale)  0  Tone (Ashworth scale)  0   Right Lower Extremity:    Left Lower Extremity:    Hip flexors  5/5   Hip flexors  5/5   Hip extensors  5/5   Hip extensors  5/5   Knee flexors  5/5   Knee flexors  5/5   Knee extensors  5/5   Knee extensors  5/5   Dorsiflexors  5/5   Dorsiflexors  5/5   Plantarflexors  5/5   Plantarflexors  5/5   Toe extensors  5/5   Toe extensors  5/5   Toe flexors  5/5   Toe flexors  5/5   Tone (Ashworth scale)  0  Tone (Ashworth scale)  0   MSRs:  Right  Left brachioradialis 2+  brachioradialis 2+  biceps 2+  biceps 2+  triceps 2+  triceps 2+  patellar 2+  patellar 2+  ankle jerk 0  ankle jerk 0  Hoffman no  Hoffman no  plantar response down  plantar response down   SENSORY:  Absent vibration distal to ankles, temperature and pin prick is reduced distal to just below the knees.  Proprioception  impaired.  Rhomberg sign is negative.  COORDINATION/GAIT: Normal finger-to- nose-finger and heel-to-shin.  Intact rapid alternating movements bilaterally.  Able to rise from a chair without using arms.  Gait wide-based, stable.  She is unsteady with tandem and stressed gait.   IMPRESSION: Mrs. Dapolito is a 69 year-old female presenting for evaluation bilateral leg burning pain.  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 I had extensive discussion with the patient regarding the pathogenesis, etiology, management, and natural course of neuropathy. Neuropathy tends to be slowly progressive, especially if a treatable etiology is not identified.  I would like to test for treatable causes of neuropathy. I discussed that in the vast majority of cases, despite checking for reversible causes, we are unable to find the underlying etiology and management is symptomatic. Neuropathy risk factors:  Low vitamin B12, diabetes (well-controlled).  Her skin changes and edema is unlikely to be related to neuropathy.  PLAN/RECOMMENDATIONS:  1.  Check vitamin B1, vitamin B6, B12, copper, SPEP/UPEP with IFE, cryoglobulins, C3, C4 2.  Increase gabapentin to 381m three times daily.  3.  Start taking vitamin B12 10054m daily 4.  Patient to call to schedule NCS/EMG after confirming with insurance provider 5.  Return to clinic in 3 months.   The duration of this appointment visit was 50 minutes of face-to-face time with the patient.  Greater than 50% of this time was spent in counseling, explanation of diagnosis, planning of further management, and coordination of care.   Thank you for allowing me to participate in patient's care.  If I can answer any additional questions, I would be pleased to do so.    Sincerely,    Shaira Sova K. PaPosey ProntoDO

## 2015-04-07 NOTE — Progress Notes (Signed)
Patient notified

## 2015-04-08 ENCOUNTER — Telehealth: Payer: Self-pay | Admitting: *Deleted

## 2015-04-08 LAB — UIFE/LIGHT CHAINS/TP QN, 24-HR UR
ALBUMIN, U: DETECTED
Total Protein, Urine: 7 mg/dL (ref 5–24)

## 2015-04-08 LAB — SPEP & IFE WITH QIG
ALPHA-1-GLOBULIN: 0.4 g/dL — AB (ref 0.2–0.3)
Albumin ELP: 3.5 g/dL — ABNORMAL LOW (ref 3.8–4.8)
Alpha-2-Globulin: 1 g/dL — ABNORMAL HIGH (ref 0.5–0.9)
BETA GLOBULIN: 0.4 g/dL (ref 0.4–0.6)
Beta 2: 0.4 g/dL (ref 0.2–0.5)
GAMMA GLOBULIN: 0.9 g/dL (ref 0.8–1.7)
IGG (IMMUNOGLOBIN G), SERUM: 854 mg/dL (ref 690–1700)
IgA: 146 mg/dL (ref 69–380)
IgM, Serum: 114 mg/dL (ref 52–322)
TOTAL PROTEIN, SERUM ELECTROPHOR: 6.4 g/dL (ref 6.1–8.1)

## 2015-04-08 NOTE — Telephone Encounter (Signed)
Scott from [facility] called to let us know that they received an order for cryoglobulin lab but did not receive a specimen.  Would you like for her to come back in.  Her next appt is in November.

## 2015-04-09 LAB — COPPER, SERUM: Copper: 130 ug/dL (ref 70–175)

## 2015-04-09 NOTE — Telephone Encounter (Signed)
No, that's okay.  I'll see what her results look like and decide.

## 2015-04-09 NOTE — Telephone Encounter (Signed)
Noted  

## 2015-04-10 LAB — VITAMIN B6: Vitamin B6: <2 ng/mL — ABNORMAL LOW (ref 2.1–21.7)

## 2015-04-10 LAB — VITAMIN B1: Vitamin B1 (Thiamine): 10 nmol/L (ref 8–30)

## 2015-07-07 ENCOUNTER — Encounter: Payer: Self-pay | Admitting: Podiatry

## 2015-07-07 ENCOUNTER — Ambulatory Visit (INDEPENDENT_AMBULATORY_CARE_PROVIDER_SITE_OTHER): Payer: Medicare Other | Admitting: Podiatry

## 2015-07-07 ENCOUNTER — Ambulatory Visit (INDEPENDENT_AMBULATORY_CARE_PROVIDER_SITE_OTHER): Payer: Medicare Other

## 2015-07-07 DIAGNOSIS — R52 Pain, unspecified: Secondary | ICD-10-CM | POA: Diagnosis not present

## 2015-07-07 DIAGNOSIS — M79673 Pain in unspecified foot: Secondary | ICD-10-CM | POA: Diagnosis not present

## 2015-07-07 DIAGNOSIS — M79675 Pain in left toe(s): Secondary | ICD-10-CM

## 2015-07-07 DIAGNOSIS — B351 Tinea unguium: Secondary | ICD-10-CM | POA: Diagnosis not present

## 2015-07-07 DIAGNOSIS — G8929 Other chronic pain: Secondary | ICD-10-CM

## 2015-07-07 NOTE — Progress Notes (Signed)
Patient ID: Megan [name], female   DOB: 05/02/1946, 69 y.o.   MRN: [id number] Complaint:  Visit Type: Patient returns to my office for continued preventative foot care services. Complaint: Patient states" my nails have grown long and thick and become painful to walk and wear shoes" Patient has been diagnosed with DM . The patient presents for preventative foot care services. No changes to ROS.  She also states her fourth toe left foot is painful and swollen.  The toenail has developed a darkened area under the nail.  No drainage noted.  Podiatric Exam: Vascular: dorsalis pedis and posterior tibial pulses are not  palpable bilateral due to swelling both feet.. Capillary return is immediate. Temperature gradient is WNL. Skin turgor WNL  Sensorium: Normal Semmes Weinstein monofilament test. Normal tactile sensation bilaterally. Nail Exam: Pt has thick disfigured discolored nails with subungual debris noted bilateral entire nail hallux through fifth toenails Ulcer Exam: There is no evidence of ulcer or pre-ulcerative changes or infection. Orthopedic Exam: Muscle tone and strength are WNL. No limitations in general ROM. No crepitus or effusions noted. Foot type and digits show no abnormalities. Bony prominences are unremarkable. Marked deviation fourth toe at level DIPJ left foot. Skin: No Porokeratosis. No infection or ulcers  Diagnosis:  Onychomycosis, , Pain in right toe, pain in left toes,  Subungual hematoma fourth toe left foot.  Treatment & Plan Procedures and Treatment: Consent by patient was obtained for treatment procedures. The patient understood the discussion of treatment and procedures well. All questions were answered thoroughly reviewed. Debridement of mycotic and hypertrophic toenails, 1 through 5 bilateral and clearing of subungual debris. No ulceration, no infection noted. Xrays reveal no bony pathology.  Return Visit-Office Procedure: Patient instructed to return to the office for  a follow up visit 3 months for continued evaluation and treatment.

## 2015-07-09 ENCOUNTER — Encounter: Payer: Self-pay | Admitting: Neurology

## 2015-07-09 ENCOUNTER — Ambulatory Visit (INDEPENDENT_AMBULATORY_CARE_PROVIDER_SITE_OTHER): Payer: Medicare Other | Admitting: Neurology

## 2015-07-09 ENCOUNTER — Ambulatory Visit: Payer: Medicare Other | Admitting: Neurology

## 2015-07-09 VITALS — BP 110/74 | HR 70 | Ht 64.0 in | Wt 250.2 lb

## 2015-07-09 DIAGNOSIS — E0842 Diabetes mellitus due to underlying condition with diabetic polyneuropathy: Secondary | ICD-10-CM | POA: Diagnosis not present

## 2015-07-09 DIAGNOSIS — E569 Vitamin deficiency, unspecified: Secondary | ICD-10-CM

## 2015-07-09 MED ORDER — GABAPENTIN 300 MG PO CAPS: 300.0000 mg | ORAL_CAPSULE | Freq: Four times a day (QID) | ORAL | Status: DC

## 2015-07-09 NOTE — Patient Instructions (Addendum)
1.  Check vitamin B6 level.  We will call you with the results 2.  Increase gabapentin 300mg  to four times daily (8am, 2pm, 5pm, and bedtime) 3.  Encouraged to stay active, start a water exercise program 4.  Return to clinic in 48-months

## 2015-07-09 NOTE — Progress Notes (Signed)
Follow-up Visit   Date: 07/09/2015    ARELIS NEUMEIER MRN: [id number] DOB: 1945-11-23   Interim History: Megan [name] is a 69 y.o. right-handed Caucasian female with inflammatory arthritis, hypertension, GERD, hyperlipidemia, diet-controlled diabetes mellitus returning to the clinic for follow-up of peripheral neuropathy.  The patient was accompanied to the clinic by husband who also provides collateral information.    History of present illness: Starting in January 2016, she began having bilateral leg pain described as burning and stinging pain. Pain is constant but will intermittent increase in intensity. Pain started above her ankles and over time has involved the entire lower leg and foot. When symptoms started, she developed sores over her legs and swelling of the legs. There was concern of DVT, so she has ultrasound of the legs which return normal. She tried wearing compression stockings and is using a topical ointment, neither which provide much relief. She endorses low back pain which is worse with prolonged standing. Denies weakness, imbalance, or pain involving the hands.   She recently increased her gabapentin to 397m twice daily. She denies any side effects or benefit.   UPDATE 07/09/2015:  Her labs indicated vitamin B6 deficiency and she has since started supplementation.  Since increasing her gabapentin to three times daily, she feels that it has helped.  Usually, her burning pain worsens around 6pm and can keep her up at night.  Currently, she takes gabapentin 8am, 2pm, and 9pm.  No interval falls or hospitalizations.  Because symptoms improved, she did not schedule NCS/EMG of the legs.  Medications:  Current Outpatient Prescriptions on File Prior to Visit  Medication Sig Dispense Refill  . (No Medication Selected) Apply 1 application topically. Carb/gaba/orph/pent/topi    . atenolol-chlorthalidone (TENORETIC) 50-25 MG per tablet Take 1 tablet by mouth  daily.    . Cholecalciferol (VITAMIN D3) 1000 UNITS CAPS Take by mouth.    . Cyanocobalamin (VITAMIN B 12 PO) Take by mouth daily at 2 PM.    . GEL-KAM 0.4 % GEL daily. as directed  0  . KLOR-CON M20 20 MEQ tablet Take 20 mEq by mouth daily.  6  . losartan (COZAAR) 100 MG tablet TAKE 1 TABLET ONCE A DAY ORALLY 90 DAYS  2  . meloxicam (MOBIC) 15 MG tablet Take 15 mg by mouth daily.    . Multiple Vitamin (VITAMIN E/FOLIC ASNKN/L-9/J-67PO) Take by mouth.    .Earney NavyBicarbonate (ZEGERID) 20-1100 MG CAPS capsule Take 1 capsule by mouth at bedtime. 28 each 0  . pantoprazole (PROTONIX) 40 MG tablet Take 1 tablet (40 mg total) by mouth daily before breakfast. 30 tablet 11  . simvastatin (ZOCOR) 40 MG tablet TAKE 1 TABLET EVERY EVENING ONCE A DAY ORALLY  2  . triamcinolone cream (KENALOG) 0.1 % Apply 1 application topically 3 (three) times daily.     No current facility-administered medications on file prior to visit.    Allergies:  Allergies  Allergen Reactions  . Aspirin Swelling  . Bee Pollen     Extreme swelling due to bee stings  . Codeine Nausea And Vomiting  . Fish Oil Diarrhea  . Lisinopril Cough  . Sulfa Antibiotics Nausea And Vomiting  . Vicodin [Hydrocodone-Acetaminophen] Nausea And Vomiting  . Camphor Dermatitis  . Penicillins Rash    Elevated blood pressure    Review of Systems:  CONSTITUTIONAL: No fevers, chills, night sweats, or weight loss.  EYES: No visual changes or eye pain ENT: No hearing changes.  No history of nose bleeds.   RESPIRATORY: No cough, wheezing and shortness of breath.   CARDIOVASCULAR: Negative for chest pain, and palpitations.   GI: Negative for abdominal discomfort, blood in stools or black stools.  No recent change in bowel habits.   GU:  No history of incontinence.   MUSCLOSKELETAL: No history of joint pain or swelling.  No myalgias.   SKIN: Negative for lesions, rash, and itching.   ENDOCRINE: Negative for cold or heat intolerance,  polydipsia or goiter.   PSYCH:  No depression or anxiety symptoms.   NEURO: As Above.   Vital Signs:  BP 110/74 mmHg  Pulse 70  Ht 5' 4"  (1.626 m)  Wt 250 lb 4 oz (113.513 kg)  BMI 42.93 kg/m2  SpO2 92%  Neurological Exam: MENTAL STATUS including orientation to time, place, person, recent and remote memory, attention span and concentration, language, and fund of knowledge is normal.  Speech is not dysarthric.  CRANIAL NERVES: Pupils equal round and reactive to light.  Normal conjugate, extra-ocular eye movements in all directions of gaze.  Mild left ptosis (old).  Face is symmetric.  MOTOR:  Motor strength is 5/5 in all extremities.  Bilateral lower leg edema.   MSRs:  Reflexes are 2+/4 throughout, except absent achilles.  SENSORY:  Absent vibration at ankles, temperature and pin prick is reduced distal to just below the knees.   COORDINATION/GAIT:  Gait is wide-based, stable, and unassisted.  Data: Labs 01/30/2015: ESR 3, vitamin B12 256 HbA1c 6.5  Labs 04/06/2015:  Vitamin B12 298, vitamin B1 10, vitamin B6 <2.0*, copper 130, SPEP/UPEP with IFE no M protein  MRI lumbar spine wo contrast 09/07/2004: 1. Left foraminal and extraforaminal disk protrusion on the left at L2-3 may affect the left L2 nerve root and could be responsible for the patient's radicular symptoms. Recommend clinical correlation.  2. Right foraminal disk protrusion at L3-4 which could affect the right L3 nerve root.  3. Annular rent and a shallow central and medial foraminal disk protrusion on the right at L4-5. No definite mass effect on the right L4 nerve root.  4. Mild hypertrophic facet changes in the lower lumbar spine.    IMPRESSION/PLAN: 1.  Peripheral neuropathy, likely due to low vitamin B6 and diabetes (diet-controlled)  - She has been taking vitamin B12 and B6 supplements and also increased gabapentin to 324m TID which in combination has helped with paresthesias  - There continues to  have breakthrough pain in the evening, so will increase gabapentin 3067mto four times daily   - Check vitamin B6 level  - If there is any worsening of paresthesias, proceed with NCS/EMG  2.  Mobid obesity  - Encouraged to start water exercise programs  Return to clinic in 6 months   The duration of this appointment visit was 25 minutes of face-to-face time with the patient.  Greater than 50% of this time was spent in counseling, explanation of diagnosis, planning of further management, and coordination of care.   Thank you for allowing me to participate in patient's care.  If I can answer any additional questions, I would be pleased to do so.    Sincerely,    Donika K. PaPosey ProntoDO

## 2015-07-13 LAB — VITAMIN B6: Vitamin B6: 58.3 ng/mL — ABNORMAL HIGH (ref 2.1–21.7)

## 2015-07-14 ENCOUNTER — Other Ambulatory Visit: Payer: Self-pay

## 2015-07-14 ENCOUNTER — Other Ambulatory Visit: Payer: Self-pay | Admitting: Internal Medicine

## 2015-07-14 MED ORDER — PANTOPRAZOLE SODIUM 40 MG PO TBEC: 40.0000 mg | DELAYED_RELEASE_TABLET | Freq: Every day | ORAL | Status: DC

## 2015-07-16 ENCOUNTER — Other Ambulatory Visit: Payer: Self-pay

## 2015-07-16 MED ORDER — PANTOPRAZOLE SODIUM 40 MG PO TBEC: 40.0000 mg | DELAYED_RELEASE_TABLET | Freq: Every day | ORAL | Status: DC

## 2015-07-16 NOTE — Telephone Encounter (Signed)
Refill for generic Protonix sent to [facility] recently, then today we get a 2nd attempt from [facility] so sent there and I called [facility] to cancel that refill.

## 2015-07-17 ENCOUNTER — Ambulatory Visit: Payer: Medicare Other | Admitting: Neurology

## 2015-09-14 ENCOUNTER — Other Ambulatory Visit: Payer: Self-pay | Admitting: Radiology

## 2015-10-20 ENCOUNTER — Ambulatory Visit (INDEPENDENT_AMBULATORY_CARE_PROVIDER_SITE_OTHER): Payer: Medicare Other | Admitting: Podiatry

## 2015-10-20 ENCOUNTER — Encounter: Payer: Self-pay | Admitting: Podiatry

## 2015-10-20 DIAGNOSIS — E1140 Type 2 diabetes mellitus with diabetic neuropathy, unspecified: Secondary | ICD-10-CM

## 2015-10-20 DIAGNOSIS — B351 Tinea unguium: Secondary | ICD-10-CM

## 2015-10-20 DIAGNOSIS — L84 Corns and callosities: Secondary | ICD-10-CM

## 2015-10-20 DIAGNOSIS — M79673 Pain in unspecified foot: Secondary | ICD-10-CM | POA: Diagnosis not present

## 2015-10-20 NOTE — Progress Notes (Signed)
Patient ID: KADEE JASA, female   DOB: 1945/11/24, 70 y.o.   MRN: [id number] Complaint:  Visit Type: Patient returns to my office for continued preventative foot care services. Complaint: Patient states" my nails have grown long and thick and become painful to walk and wear shoes" Patient has been diagnosed with DM . The patient presents for preventative foot care services. No changes to ROS  Podiatric Exam: Vascular: dorsalis pedis and posterior tibial pulses are not  palpable bilateral due to swelling both feet.. Capillary return is immediate. Temperature gradient is WNL. Skin turgor WNL  Sensorium: Normal Semmes Weinstein monofilament test. Normal tactile sensation bilaterally. Nail Exam: Pt has thick disfigured discolored nails with subungual debris noted bilateral entire nail hallux through fifth toenails Ulcer Exam: There is no evidence of ulcer or pre-ulcerative changes or infection. Orthopedic Exam: Muscle tone and strength are WNL. No limitations in general ROM. No crepitus or effusions noted. Foot type and digits show no abnormalities. Bony prominences are unremarkable. Skin: No Porokeratosis. No infection or ulcers  Diagnosis:  Onychomycosis, , Pain in right toe, pain in left toes.Preulcerous callus sib 1st MPJ Right.  Treatment & Plan Procedures and Treatment: Consent by patient was obtained for treatment procedures. The patient understood the discussion of treatment and procedures well. All questions were answered thoroughly reviewed. Debridement of mycotic and hypertrophic toenails, 1 through 5 bilateral and clearing of subungual debris. No ulceration, no infection noted. Initiate diabetic shoe [id number] for diabetes with neuropathy and preulcerous callus sib 1 right foot. Return Visit-Office Procedure: Patient instructed to return to the office for a follow up visit 3 months for continued evaluation and treatment.   Gardiner Barefoot DPM

## 2015-11-13 ENCOUNTER — Telehealth: Payer: Self-pay | Admitting: *Deleted

## 2015-11-13 NOTE — Telephone Encounter (Signed)
Called patient to schedule an appointment for diabetic shoe measurement.  Patient stated she is getting shoes somewhere else.  Told her I will cancel the order and notify her doctor.

## 2015-11-19 ENCOUNTER — Other Ambulatory Visit: Payer: Self-pay | Admitting: Family Medicine

## 2015-11-19 DIAGNOSIS — R109 Unspecified abdominal pain: Secondary | ICD-10-CM

## 2015-11-24 ENCOUNTER — Ambulatory Visit
Admission: RE | Admit: 2015-11-24 | Discharge: 2015-11-24 | Disposition: A | Discharge status: Home / Self Care | Payer: Medicare Other | Source: Ambulatory Visit | Attending: Family Medicine | Admitting: Family Medicine

## 2015-11-24 DIAGNOSIS — R109 Unspecified abdominal pain: Secondary | ICD-10-CM

## 2015-11-24 MED ORDER — IOPAMIDOL (ISOVUE-300) INJECTION 61%
125.0000 mL | Freq: Once | INTRAVENOUS | Status: AC | PRN
  Administered 2015-11-24: 125 mL via INTRAVENOUS

## 2016-01-07 ENCOUNTER — Encounter: Payer: Self-pay | Admitting: Neurology

## 2016-01-07 ENCOUNTER — Ambulatory Visit (INDEPENDENT_AMBULATORY_CARE_PROVIDER_SITE_OTHER): Payer: Medicare Other | Admitting: Neurology

## 2016-01-07 VITALS — BP 110/70 | HR 81 | Ht 64.0 in | Wt 246.5 lb

## 2016-01-07 DIAGNOSIS — G250 Essential tremor: Secondary | ICD-10-CM

## 2016-01-07 DIAGNOSIS — E0842 Diabetes mellitus due to underlying condition with diabetic polyneuropathy: Secondary | ICD-10-CM | POA: Diagnosis not present

## 2016-01-07 NOTE — Patient Instructions (Addendum)
Stop vitamin B6 Increase gabapentin 300mg  to 1 tablet in the morning and afternoon and 2 tablets at bedtime Call if you develop too much sleepiness and I will send a prescriptions for 100mg  tablets  Return to clinic in 6 months

## 2016-01-07 NOTE — Progress Notes (Signed)
Follow-up Visit   Date: 01/07/2016    Megan [name] MRN: [id number] DOB: 30-Mar-1946   Interim History: Megan [name] is a 70 y.o. right-handed Caucasian female with inflammatory arthritis, hypertension, GERD, hyperlipidemia, diet-controlled diabetes mellitus returning to the clinic for follow-up of peripheral neuropathy.  The patient was accompanied to the clinic by husband who also provides collateral information.    History of present illness: Starting in January 2016, she began having bilateral leg pain described as burning and stinging pain. Pain is constant but will intermittent increase in intensity. Pain started above her ankles and over time has involved the entire lower leg and foot. When symptoms started, she developed sores over her legs and swelling of the legs. There was concern of DVT, so she has ultrasound of the legs which return normal. She tried wearing compression stockings and is using a topical ointment, neither which provide much relief. She endorses low back pain which is worse with prolonged standing. Denies weakness, imbalance, or pain involving the hands.   She recently increased her gabapentin to 372m twice daily. She denies any side effects or benefit.   UPDATE 07/09/2015:  Her labs indicated vitamin B6 deficiency and she has since started supplementation.  Since increasing her gabapentin to three times daily, she feels that it has helped.  Usually, her burning pain worsens around 6pm and can keep her up at night.  She takes gabapentin 8am, 2pm, and 9pm.    UPDATE 01/07/2016:  She continues to have breakthrough burning pain involving the feet in the early evening.  She is taking gabapentin 3045mthree times daily and noticed that if she skips a dose, the pain is much more severe.   She denies any weakness or imbalance.   Over the past few months, she has noticed a mild tremor when trying to use a fork with her right hand.  It is not present  at rest and does not bother her. She is concerned because her mother had Parkinson's disease.  Medications:  Current Outpatient Prescriptions on File Prior to Visit  Medication Sig Dispense Refill  . (No Medication Selected) Apply 1 application topically. Carb/gaba/orph/pent/topi    . atenolol-chlorthalidone (TENORETIC) 50-25 MG per tablet Take 1 tablet by mouth daily.    . Cholecalciferol (VITAMIN D3) 1000 UNITS CAPS Take by mouth.    . Cyanocobalamin (VITAMIN B 12 PO) Take by mouth daily at 2 PM.    . furosemide (LASIX) 20 MG tablet Take 20 mg by mouth daily as needed.    . gabapentin (NEURONTIN) 300 MG capsule Take 1 capsule (300 mg total) by mouth 4 (four) times daily. 360 capsule 3  . GEL-KAM 0.4 % GEL daily. as directed  0  . KLOR-CON M20 20 MEQ tablet Take 20 mEq by mouth daily.  6  . losartan (COZAAR) 100 MG tablet TAKE 1 TABLET ONCE A DAY ORALLY 90 DAYS  2  . meloxicam (MOBIC) 15 MG tablet Take 15 mg by mouth daily.    . Multiple Vitamin (VITAMIN E/FOLIC ACWGYK/Z-9/D-35O) Take by mouth.    . Earney Navyicarbonate (ZEGERID) 20-1100 MG CAPS capsule Take 1 capsule by mouth at bedtime. 28 each 0  . pantoprazole (PROTONIX) 40 MG tablet Take 1 tablet (40 mg total) by mouth daily before breakfast. 90 tablet 1  . simvastatin (ZOCOR) 40 MG tablet TAKE 1 TABLET EVERY EVENING ONCE A DAY ORALLY  2  . triamcinolone cream (KENALOG) 0.1 % Apply 1 application topically 3 (  three) times daily.     No current facility-administered medications on file prior to visit.    Allergies:  Allergies  Allergen Reactions  . Aspirin Swelling  . Bee Pollen     Extreme swelling due to bee stings  . Codeine Nausea And Vomiting  . Fish Oil Diarrhea  . Lisinopril Cough  . Sulfa Antibiotics Nausea And Vomiting  . Vicodin [Hydrocodone-Acetaminophen] Nausea And Vomiting  . Camphor Dermatitis  . Penicillins Rash    Elevated blood pressure    Review of Systems:  CONSTITUTIONAL: No fevers, chills,  night sweats, or weight loss.  EYES: No visual changes or eye pain ENT: No hearing changes.  No history of nose bleeds.   RESPIRATORY: No cough, wheezing and shortness of breath.   CARDIOVASCULAR: Negative for chest pain, and palpitations.   GI: Negative for abdominal discomfort, blood in stools or black stools.  No recent change in bowel habits.   GU:  No history of incontinence.   MUSCLOSKELETAL: No history of joint pain or swelling.  No myalgias.   SKIN: Negative for lesions, rash, and itching.   ENDOCRINE: Negative for cold or heat intolerance, polydipsia or goiter.   PSYCH:  No depression or anxiety symptoms.   NEURO: As Above.   Vital Signs:  BP 110/70 mmHg  Pulse 81  Ht '5\' 4"'$  (1.626 m)  Wt 246 lb 8 oz (111.812 kg)  BMI 42.29 kg/m2  SpO2 97%  Neurological Exam: MENTAL STATUS including orientation to time, place, person, recent and remote memory, attention span and concentration, language, and fund of knowledge is normal.  Speech is not dysarthric.  CRANIAL NERVES: Pupils equal round and reactive to light.  Normal conjugate, extra-ocular eye movements in all directions of gaze.  Mild left ptosis (old).  Face is symmetric.  MOTOR:  Motor strength is 5/5 in all extremities.  Bilateral lower leg edema. Normal tone.  She has mild intention tremor of the left hand with finger-to-nose testing.    MSRs:  Reflexes are 2+/4 throughout, except absent achilles.  SENSORY:  Absent vibration at ankles, intact at the knees.   COORDINATION/GAIT:  Gait is wide-based, stable, and unassisted.  No bradykinesia.   Data: Labs 01/30/2015: ESR 3, vitamin B12 256 HbA1c 6.5  Labs 04/06/2015:  Vitamin B12 298, vitamin B1 10, vitamin B6 <2.0*, copper 130, SPEP/UPEP with IFE no M protein  Labs 11/10/20116:  Vitamin B6 58.6 MRI lumbar spine wo contrast 09/07/2004: 1. Left foraminal and extraforaminal disk protrusion on the left at L2-3 may affect the left L2 nerve root and could be responsible for the  patient's radicular symptoms. Recommend clinical correlation.  2. Right foraminal disk protrusion at L3-4 which could affect the right L3 nerve root.  3. Annular rent and a shallow central and medial foraminal disk protrusion on the right at L4-5. No definite mass effect on the right L4 nerve root.  4. Mild hypertrophic facet changes in the lower lumbar spine.    IMPRESSION/PLAN: 1.  Peripheral neuropathy, likely due to diabetes (diet-controlled)  - Increase gabapentin '300mg'$  in the morning and afternoon and '600mg'$  at bedtime to help with breakthrough pain in the evening  - If develops side effects increase to gabapentin '600mg'$ , we can adjust gabapentin with '100mg'$  and titrate slowly  - Discontinue vitamin B6 levels   - If there is any worsening of paresthesias, proceed with NCS/EMG  2.  Essential tremor  - Reassured patient that she does not have any signs of Parkinson's disease  -  Continue to follow clinically  Return to clinic in 6 months   The duration of this appointment visit was 25 minutes of face-to-face time with the patient.  Greater than 50% of this time was spent in counseling, explanation of diagnosis, planning of further management, and coordination of care.   Thank you for allowing me to participate in patient's care.  If I can answer any additional questions, I would be pleased to do so.    Sincerely,    Shivaay Stormont K. Posey Pronto, DO

## 2016-01-26 ENCOUNTER — Encounter: Payer: Self-pay | Admitting: Podiatry

## 2016-01-26 ENCOUNTER — Ambulatory Visit (INDEPENDENT_AMBULATORY_CARE_PROVIDER_SITE_OTHER): Payer: Medicare Other

## 2016-01-26 ENCOUNTER — Ambulatory Visit (INDEPENDENT_AMBULATORY_CARE_PROVIDER_SITE_OTHER): Payer: Medicare Other | Admitting: Podiatry

## 2016-01-26 DIAGNOSIS — B351 Tinea unguium: Secondary | ICD-10-CM | POA: Diagnosis not present

## 2016-01-26 DIAGNOSIS — E1140 Type 2 diabetes mellitus with diabetic neuropathy, unspecified: Secondary | ICD-10-CM

## 2016-01-26 DIAGNOSIS — M79674 Pain in right toe(s): Secondary | ICD-10-CM | POA: Diagnosis not present

## 2016-01-26 DIAGNOSIS — M79673 Pain in unspecified foot: Secondary | ICD-10-CM

## 2016-01-26 NOTE — Progress Notes (Signed)
Subjective:     Patient ID: Megan [name], female   DOB: 1946-03-27, 70 y.o.   MRN: [id number]  HPI this patient presents to the office with chief complaint of long thick painful nails. She states that the nails are painful walking and wearing her shoes especially the big toenails on both feet. She also states she has a painful callus under the ball of the right foot. She presents to the office for continued evaluation and treatment. He presents for preventative foot care services during this visit. She says she has developed extreme pain noted on the top of the big toe of the right foot. She denies any history of trauma or reinjury to the foot. She presents the office asking if she could be evaluated and treated for her painful big toe joint, right foot.  Review of her history reveals diagnosis capsulitis 1st MPJ  right foot which was treated with injection therapy.   Review of Systems     Objective:   Physical Exam GENERAL APPEARANCE: Alert, conversant. Appropriately groomed. No acute distress.  VASCULAR: Pedal pulses are   palpable at  [facility] and PT bilateral.  Capillary refill time is immediate to all digits,  Normal temperature gradient.  Digital hair growth is present bilateral  NEUROLOGIC: sensation is normal to 5.07 monofilament at 5/5 sites bilateral.  Light touch is intact bilateral, Muscle strength normal.  MUSCULOSKELETAL: acceptable muscle strength, tone and stability bilateral.  Intrinsic muscluature intact bilateral.  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   DERMATOLOGIC: skin color, texture, and turgor are within normal limits.  No preulcerative lesions or ulcers  are seen, no interdigital maceration noted.  No open lesions present.   No drainage noted. Callus under big toe joint right foot. NAILS  Thick disfigured discolored nails both feet.     Assessment:     Onychomycosis with associated pain.  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  Diabetes with neuropathy. 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       Plan:     Debride nails.  Xray reveal elongated first metatarsal with dorsal crowning 1st MPJ  Right foot.Injection therapy right foot.Patient is already taking Mobic daily.  RTC 2 weeks for evaluation right foot capsulitis.   Gardiner Barefoot DPM

## 2016-01-28 ENCOUNTER — Encounter: Payer: Self-pay | Admitting: Internal Medicine

## 2016-01-29 ENCOUNTER — Other Ambulatory Visit: Payer: Self-pay | Admitting: Internal Medicine

## 2016-02-10 ENCOUNTER — Encounter: Payer: Self-pay | Admitting: Podiatry

## 2016-02-10 ENCOUNTER — Ambulatory Visit (INDEPENDENT_AMBULATORY_CARE_PROVIDER_SITE_OTHER): Payer: Medicare Other | Admitting: Podiatry

## 2016-02-10 VITALS — BP 138/67 | HR 77 | Resp 16

## 2016-02-10 DIAGNOSIS — M2580 Other specified joint disorders, unspecified joint: Secondary | ICD-10-CM | POA: Diagnosis not present

## 2016-02-10 DIAGNOSIS — M778 Other enthesopathies, not elsewhere classified: Secondary | ICD-10-CM

## 2016-02-10 DIAGNOSIS — M7751 Other enthesopathy of right foot: Secondary | ICD-10-CM

## 2016-02-10 DIAGNOSIS — M79674 Pain in right toe(s): Secondary | ICD-10-CM | POA: Diagnosis not present

## 2016-02-10 DIAGNOSIS — M779 Enthesopathy, unspecified: Secondary | ICD-10-CM

## 2016-02-10 NOTE — Progress Notes (Signed)
Subjective:     Patient ID: Megan [name], female   DOB: 05-Apr-1946, 70 y.o.   MRN: [id number]  HPI this patient presents to the office with chief complaint of a hallux limitus first MPJ of the right foot. She was treated with injection therapy in the big toe joint of the right foot and states that she is 40% improved. She says that her pain has been localized to the bottom of the big toe joint. She says this area is extremely painful walking and wearing her shoes. She is diabetic and wears diabetic insoles. She returns to the office follow-up for an evaluation and treatment of her right   Review of Systems     Objective:   Physical Exam GENERAL APPEARANCE: Alert, conversant. Appropriately groomed. No acute distress.  VASCULAR: Pedal pulses are  palpable at  [facility] and PT bilateral.  Capillary refill time is immediate to all digits,  Normal temperature gradient.  Digital hair growth is present bilateral  NEUROLOGIC: sensation is normal to 5.07 monofilament at 5/5 sites bilateral.  Light touch is intact bilateral, Muscle strength normal.  MUSCULOSKELETAL: acceptable muscle strength, tone and stability bilateral.  Intrinsic muscluature intact bilateral.  Rectus appearance of foot and digits noted bilateral. 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  Palpable pain tibial sesamoid right foot.  DERMATOLOGIC: skin color, texture, and turgor are within normal limits.  No preulcerative lesions or ulcers  are seen, no interdigital maceration noted.  No open lesions present.  Digital nails are asymptomatic. No drainage noted.      Assessment:     Sesamoiditis right foot.  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     Plan:     ROV  evaluation today revealed pain under the tibial sesamoid first MPJ of the right foot. Reviewing the x-ray from last week revealed a fracture of the tibial sesamoid first MPJ of the right foot. After discussing her pathology with the patient,  It was decided to apply  dispersion padding  to the diabetic insole in her diabetic shoe. If the pain persists, she should return and we will provide her with injection therapy to the tibial sesamoid RTC prn   Gardiner Barefoot DPM

## 2016-04-19 ENCOUNTER — Ambulatory Visit: Payer: Medicare Other | Admitting: Podiatry

## 2016-05-10 ENCOUNTER — Ambulatory Visit (INDEPENDENT_AMBULATORY_CARE_PROVIDER_SITE_OTHER): Payer: Medicare Other | Admitting: Podiatry

## 2016-05-10 ENCOUNTER — Encounter: Payer: Self-pay | Admitting: Podiatry

## 2016-05-10 DIAGNOSIS — B351 Tinea unguium: Secondary | ICD-10-CM | POA: Diagnosis not present

## 2016-05-10 DIAGNOSIS — E1140 Type 2 diabetes mellitus with diabetic neuropathy, unspecified: Secondary | ICD-10-CM | POA: Diagnosis not present

## 2016-05-10 DIAGNOSIS — M79673 Pain in unspecified foot: Secondary | ICD-10-CM | POA: Diagnosis not present

## 2016-05-10 NOTE — Progress Notes (Signed)
Patient ID: Megan [name], female   DOB: 1946-07-28, 70 y.o.   MRN: [id number] Complaint:  Visit Type: Patient returns to my office for continued preventative foot care services. Complaint: Patient states" my nails have grown long and thick and become painful to walk and wear shoes" Patient has been diagnosed with DM . The patient presents for preventative foot care services. No changes to ROS.  Patient says her nail on big toe was injured last week and she has been wearing band-aid since then.  Podiatric Exam: Vascular: dorsalis pedis and posterior tibial pulses are not  palpable bilateral due to swelling both feet.. Capillary return is immediate. Temperature gradient is WNL. Skin turgor WNL  Sensorium: Normal Semmes Weinstein monofilament test. Normal tactile sensation bilaterally. Nail Exam: Pt has thick disfigured discolored nails with subungual debris noted bilateral entire nail hallux through fifth toenails.  Nail plate right hallux has separated from nail bed region.  No redness or swelling noted. Ulcer Exam: There is no evidence of ulcer or pre-ulcerative changes or infection. Orthopedic Exam: Muscle tone and strength are WNL. No limitations in general ROM. No crepitus or effusions noted. Foot type and digits show no abnormalities. Bony prominences are unremarkable. Skin: No Porokeratosis. No infection or ulcers  Diagnosis:  Onychomycosis, , Pain in right toe, pain in left toes  Treatment & Plan Procedures and Treatment: Consent by patient was obtained for treatment procedures. The patient understood the discussion of treatment and procedures well. All questions were answered thoroughly reviewed. Debridement of mycotic and hypertrophic toenails, 1 through 5 bilateral and clearing of subungual debris. No ulceration, no infection noted.  Return Visit-Office Procedure: Patient instructed to return to the office for a follow up visit 3 months for continued evaluation and treatment.

## 2016-07-11 ENCOUNTER — Encounter: Payer: Self-pay | Admitting: Neurology

## 2016-07-11 ENCOUNTER — Ambulatory Visit (INDEPENDENT_AMBULATORY_CARE_PROVIDER_SITE_OTHER): Payer: Medicare Other | Admitting: Neurology

## 2016-07-11 VITALS — BP 138/86 | HR 92 | Ht 64.0 in | Wt 238.0 lb

## 2016-07-11 DIAGNOSIS — E0842 Diabetes mellitus due to underlying condition with diabetic polyneuropathy: Secondary | ICD-10-CM | POA: Diagnosis not present

## 2016-07-11 DIAGNOSIS — G250 Essential tremor: Secondary | ICD-10-CM

## 2016-07-11 MED ORDER — GABAPENTIN 600 MG PO TABS: 600.0000 mg | ORAL_TABLET | Freq: Two times a day (BID) | ORAL | 3 refills | Status: DC

## 2016-07-11 NOTE — Patient Instructions (Addendum)
1.  Start by increasing your nighttime dose to 600mg  for a few days and continue 300mg  in the morning, then increase to 600mg  twice daily.   You can take an extra 300mg  tablet in the afternoon as needed  2.  Call my office if you would like to start medication for your tremors  Return to clinic 7 months

## 2016-07-11 NOTE — Progress Notes (Signed)
Follow-up Visit   Date: 07/11/16    Megan [name] MRN: [id number] DOB: 12-05-1945   Interim History: Megan [name] is a 70 y.o. right-handed Caucasian female with inflammatory arthritis, hypertension, GERD, hyperlipidemia, diet-controlled diabetes mellitus returning to the clinic for follow-up of peripheral neuropathy.  The patient was accompanied to the clinic by husband who also provides collateral information.    History of present illness: Starting in January 2016, she began having bilateral leg pain described as burning and stinging pain. Pain is constant but will intermittent increase in intensity. Pain started above her ankles and over time has involved the entire lower leg and foot. When symptoms started, she developed sores over her legs and swelling of the legs. There was concern of DVT, so she has ultrasound of the legs which return normal. She tried wearing compression stockings and is using a topical ointment, neither which provide much relief. She endorses low back pain which is worse with prolonged standing.  UPDATE 07/09/2015:  Her labs indicated vitamin B6 deficiency and she has since started supplementation.  Since increasing her gabapentin to three times daily, she feels that it has helped.  Usually, her burning pain worsens around 6pm and can keep her up at night.  She takes gabapentin 8am, 2pm, and 9pm.    UPDATE 07/11/2016:  She continues to have breakthrough burning pain involving the feet, especially at bedtime.  She is taking gabapentin 356m four times daily and denies any side effects of sedation or lightheadedness.     She continues to report a mild tremor when trying to use a fork with her right hand, which worries her about Parkinson's disease, because her mother had it.  Tremor is not present at rest.  She denies any slowed movements, stiffness, or falls.   Medications:  Current Outpatient Prescriptions on File Prior to Visit  Medication  Sig Dispense Refill  . (No Medication Selected) Apply 1 application topically. Carb/gaba/orph/pent/topi    . Acetaminophen (TYLENOL ARTHRITIS PAIN PO) Take by mouth.    .Marland Kitchenatenolol-chlorthalidone (TENORETIC) 50-25 MG per tablet Take 1 tablet by mouth daily.    . Cholecalciferol (VITAMIN D3) 1000 UNITS CAPS Take by mouth.    . Cyanocobalamin (VITAMIN B 12 PO) Take by mouth daily at 2 PM.    . GEL-KAM 0.4 % GEL daily. as directed  0  . LORazepam (ATIVAN) 0.5 MG tablet     . losartan (COZAAR) 100 MG tablet TAKE 1 TABLET ONCE A DAY ORALLY 90 DAYS  2  . meloxicam (MOBIC) 15 MG tablet Take 15 mg by mouth daily.    . Multiple Vitamin (VITAMIN E/FOLIC AUXNA/T-5/T-73PO) Take by mouth.    .Earney NavyBicarbonate (ZEGERID) 20-1100 MG CAPS capsule Take 1 capsule by mouth at bedtime. 28 each 0  . pantoprazole (PROTONIX) 40 MG tablet Take 1 tablet by mouth  daily before breakfast 90 tablet 0  . triamcinolone cream (KENALOG) 0.1 % Apply 1 application topically 3 (three) times daily.     No current facility-administered medications on file prior to visit.     Allergies:  Allergies  Allergen Reactions  . Aspirin Swelling  . Bee Pollen     Extreme swelling due to bee stings  . Codeine Nausea And Vomiting  . Fish Oil Diarrhea  . Lisinopril Cough  . Sulfa Antibiotics Nausea And Vomiting  . Vicodin [Hydrocodone-Acetaminophen] Nausea And Vomiting  . Camphor Dermatitis  . Penicillins Rash    Elevated blood pressure  Review of Systems:  CONSTITUTIONAL: No fevers, chills, night sweats, or weight loss.  EYES: No visual changes or eye pain ENT: No hearing changes.  No history of nose bleeds.   RESPIRATORY: No cough, wheezing and shortness of breath.   CARDIOVASCULAR: Negative for chest pain, and palpitations.   GI: Negative for abdominal discomfort, blood in stools or black stools.  No recent change in bowel habits.   GU:  No history of incontinence.   MUSCLOSKELETAL: No history of joint pain  or swelling.  No myalgias.   SKIN: Negative for lesions, rash, and itching.   ENDOCRINE: Negative for cold or heat intolerance, polydipsia or goiter.   PSYCH:  No depression or anxiety symptoms.   NEURO: As Above.   Vital Signs:  BP 138/86   Pulse 92   Ht 5' 4"  (1.626 m)   Wt 238 lb (108 kg)   SpO2 94%   BMI 40.85 kg/m   Neurological Exam: MENTAL STATUS including orientation to time, place, person, recent and remote memory, attention span and concentration, language, and fund of knowledge is normal.  Speech is not dysarthric.  CRANIAL NERVES:   Mild left ptosis (old).  Face is symmetric.  MOTOR:  Motor strength is 5/5 in all extremities.  Bilateral lower leg edema.  She has mild intention tremor of the right hand with finger-to-nose testing.    MSRs:  Reflexes are 2+/4 throughout, except absent achilles.  SENSORY:  Absent vibration at ankles, intact at the knees.   COORDINATION/GAIT:  Gait is wide-based, stable, and unassisted.  No bradykinesia.   Data: Labs 01/30/2015: ESR 3, vitamin B12 256 HbA1c 6.5  Labs 04/06/2015:  Vitamin B12 298, vitamin B1 10, vitamin B6 <2.0*, copper 130, SPEP/UPEP with IFE no M protein  Labs 11/10/20116:  Vitamin B6 58.6 MRI lumbar spine wo contrast 09/07/2004: 1. Left foraminal and extraforaminal disk protrusion on the left at L2-3 may affect the left L2 nerve root and could be responsible for the patient's radicular symptoms. Recommend clinical correlation.  2. Right foraminal disk protrusion at L3-4 which could affect the right L3 nerve root.  3. Annular rent and a shallow central and medial foraminal disk protrusion on the right at L4-5. No definite mass effect on the right L4 nerve root.  4. Mild hypertrophic facet changes in the lower lumbar spine.    IMPRESSION/PLAN: 1.  Peripheral neuropathy, likely due to diabetes (diet-controlled)  - Increase gabapentin 652m twice daily, okay to add extra 3060min the afternoon, if needed  -  If there is any worsening of paresthesias, proceed with NCS/EMG  2.  Essential tremor  - Reassured patient that she does not have any signs of Parkinson's disease  - Continue to follow clinically  Return to clinic in 7 months   The duration of this appointment visit was 25 minutes of face-to-face time with the patient.  Greater than 50% of this time was spent in counseling, explanation of diagnosis, planning of further management, and coordination of care.   Thank you for allowing me to participate in patient's care.  If I can answer any additional questions, I would be pleased to do so.    Sincerely,    Avigail Pilling K. PaPosey ProntoDO

## 2016-07-19 ENCOUNTER — Ambulatory Visit
Admission: RE | Admit: 2016-07-19 | Discharge: 2016-07-19 | Disposition: A | Discharge status: Home / Self Care | Source: Ambulatory Visit | Attending: Family Medicine | Admitting: Family Medicine

## 2016-07-19 ENCOUNTER — Other Ambulatory Visit: Payer: Self-pay | Admitting: Family Medicine

## 2016-07-19 DIAGNOSIS — M25562 Pain in left knee: Secondary | ICD-10-CM

## 2016-07-31 ENCOUNTER — Other Ambulatory Visit (INDEPENDENT_AMBULATORY_CARE_PROVIDER_SITE_OTHER): Payer: Self-pay | Admitting: Orthopaedic Surgery

## 2016-08-09 ENCOUNTER — Encounter: Payer: Self-pay | Admitting: Podiatry

## 2016-08-09 ENCOUNTER — Ambulatory Visit (INDEPENDENT_AMBULATORY_CARE_PROVIDER_SITE_OTHER): Payer: Medicare Other | Admitting: Podiatry

## 2016-08-09 DIAGNOSIS — B351 Tinea unguium: Secondary | ICD-10-CM | POA: Diagnosis not present

## 2016-08-09 DIAGNOSIS — E1140 Type 2 diabetes mellitus with diabetic neuropathy, unspecified: Secondary | ICD-10-CM

## 2016-08-09 NOTE — Progress Notes (Signed)
Patient ID: Megan [name], female   DOB: [birth year], 70 y.o.   MRN: [id number] Complaint:  Visit Type: Patient returns to my office for continued preventative foot care services. Complaint: Patient states" my nails have grown long and thick and become painful to walk and wear shoes" Patient has been diagnosed with DM . This patient also says she is having pain and bleeding from her big toenails both feet.    Podiatric Exam: Vascular: dorsalis pedis and posterior tibial pulses are not  palpable bilateral due to swelling both feet.. Capillary return is immediate. Temperature gradient is WNL. Skin turgor WNL  Sensorium: Normal Semmes Weinstein monofilament test. Normal tactile sensation bilaterally. Nail Exam: Pt has thick disfigured discolored nails with subungual debris noted bilateral entire nail hallux through fifth toenails.  Nail plate both hallux have  separated from nail bed region.  No redness or swelling noted. Ulcer Exam: There is no evidence of ulcer or pre-ulcerative changes or infection. Orthopedic Exam: Muscle tone and strength are WNL. No limitations in general ROM. No crepitus or effusions noted. Foot type and digits show no abnormalities. Bony prominences are unremarkable. Skin: No Porokeratosis. No infection or ulcers  Diagnosis:  Onychomycosis, , Pain in right toe, pain in left toes  Treatment & Plan Procedures and Treatment: Consent by patient was obtained for treatment procedures. The patient understood the discussion of treatment and procedures well. All questions were answered thoroughly reviewed. Debridement of mycotic and hypertrophic toenails, 1 through 5 bilateral and clearing of subungual debris. No ulceration, no infection noted.  Return Visit-Office Procedure: Patient instructed to return to the office for a follow up visit 3 months for continued evaluation and treatment.

## 2016-08-30 ENCOUNTER — Other Ambulatory Visit: Payer: Self-pay | Admitting: Internal Medicine

## 2016-08-30 ENCOUNTER — Other Ambulatory Visit: Payer: Self-pay | Admitting: *Deleted

## 2016-08-30 ENCOUNTER — Other Ambulatory Visit: Payer: Self-pay | Admitting: Neurology

## 2016-08-30 MED ORDER — GABAPENTIN 300 MG PO CAPS: ORAL_CAPSULE | ORAL | 3 refills | Status: DC

## 2016-10-18 ENCOUNTER — Ambulatory Visit: Payer: Medicare Other | Admitting: Podiatry

## 2016-11-01 ENCOUNTER — Encounter: Payer: Self-pay | Admitting: Podiatry

## 2016-11-01 ENCOUNTER — Ambulatory Visit (INDEPENDENT_AMBULATORY_CARE_PROVIDER_SITE_OTHER): Payer: Medicare Other | Admitting: Podiatry

## 2016-11-01 DIAGNOSIS — M79676 Pain in unspecified toe(s): Secondary | ICD-10-CM | POA: Diagnosis not present

## 2016-11-01 DIAGNOSIS — B351 Tinea unguium: Secondary | ICD-10-CM | POA: Diagnosis not present

## 2016-11-01 DIAGNOSIS — E1140 Type 2 diabetes mellitus with diabetic neuropathy, unspecified: Secondary | ICD-10-CM

## 2016-11-01 DIAGNOSIS — M79674 Pain in right toe(s): Secondary | ICD-10-CM

## 2016-11-01 NOTE — Progress Notes (Signed)
Patient ID: Megan [name], female   DOB: 1946/04/16, 71 y.o.   MRN: [id number] Complaint:  Visit Type: Patient returns to my office for continued preventative foot care services. Complaint: Patient states" my nails have grown long and thick and become painful to walk and wear shoes" Patient has been diagnosed with DM . This patient also says she is having pain and bleeding from her big toenails both feet.    Podiatric Exam: Vascular: dorsalis pedis and posterior tibial pulses are not  palpable bilateral due to swelling both feet.. Capillary return is immediate. Temperature gradient is WNL. Skin turgor WNL  Sensorium: Normal Semmes Weinstein monofilament test. Normal tactile sensation bilaterally. Nail Exam: Pt has thick disfigured discolored nails with subungual debris noted bilateral entire nail hallux through fifth toenails.  Nail plate both hallux have  separated from nail bed region.  No redness or swelling noted. Ulcer Exam: There is no evidence of ulcer or pre-ulcerative changes or infection. Orthopedic Exam: Muscle tone and strength are WNL. No limitations in general ROM. No crepitus or effusions noted. Foot type and digits show no abnormalities. Bony prominences are unremarkable. Skin: No Porokeratosis. No infection or ulcers  Diagnosis:  Onychomycosis, , Pain in right toe, pain in left toes  Treatment & Plan Procedures and Treatment: Consent by patient was obtained for treatment procedures. The patient understood the discussion of treatment and procedures well. All questions were answered thoroughly reviewed. Debridement of mycotic and hypertrophic toenails, 1 through 5 bilateral and clearing of subungual debris. No ulceration, no infection noted. Discussed possible nail surgery.  Decided to reappoint on 10 weeks. Return Visit-Office Procedure: Patient instructed to return to the office for a follow up visit 10 weeks for continued evaluation and treatment.

## 2016-11-08 ENCOUNTER — Encounter (INDEPENDENT_AMBULATORY_CARE_PROVIDER_SITE_OTHER): Payer: Self-pay | Admitting: Orthopaedic Surgery

## 2016-11-08 ENCOUNTER — Telehealth (INDEPENDENT_AMBULATORY_CARE_PROVIDER_SITE_OTHER): Payer: Self-pay | Admitting: *Deleted

## 2016-11-08 ENCOUNTER — Ambulatory Visit (INDEPENDENT_AMBULATORY_CARE_PROVIDER_SITE_OTHER): Payer: Medicare Other | Admitting: Orthopaedic Surgery

## 2016-11-08 DIAGNOSIS — G8929 Other chronic pain: Secondary | ICD-10-CM

## 2016-11-08 DIAGNOSIS — M545 Low back pain: Secondary | ICD-10-CM

## 2016-11-08 MED ORDER — PREDNISONE 10 MG (21) PO TBPK: ORAL_TABLET | ORAL | 0 refills | Status: DC

## 2016-11-08 NOTE — Telephone Encounter (Signed)
Called pt to let her know its ready for pick up at the pharm. She states pharmacist never called

## 2016-11-08 NOTE — Telephone Encounter (Signed)
Pt called stating she went to pharmacy and the prednisone was not ordered, diclofenacsodec 75mg  was the one that is there. Pt requesting call back CB: [phone]

## 2016-11-08 NOTE — Progress Notes (Signed)
Office Visit Note   Patient: Megan [name]           Date of Birth: Jan 16, 1946           MRN: [id number] Visit Date: 11/08/2016              Requested by: [name], MD [address] [place], [place] [address] PCP: [name], MD   Assessment & Plan: Visit Diagnoses:  1. Chronic bilateral low back pain, with sciatica presence unspecified     Plan: MRI of the lumbar spine order to rule out structural abnormalities. Follow-up after the MRI  Follow-Up Instructions: Return in about 10 days (around 11/18/2016).   Orders:  Orders Placed This Encounter  Procedures  . MR Lumbar Spine w/o contrast   Meds ordered this encounter  Medications  . predniSONE (STERAPRED UNI-PAK 21 TAB) 10 MG (21) TBPK tablet    Sig: Take as directed    Dispense:  21 tablet    Refill:  0      Procedures: No procedures performed   Clinical Data: No additional findings.   Subjective: Chief Complaint  Patient presents with  . Left Hip - Pain    Patient is a 71 year old female with left hip and low back pain with radiation down into the thigh and groin region. She has constant pain. She has had epidural steroid injections back in 2017 with relief with Dr. Ernestina Patches. She has difficulty sleeping.    Review of Systems  Constitutional: Negative.   HENT: Negative.   Eyes: Negative.   Respiratory: Negative.   Cardiovascular: Negative.   Endocrine: Negative.   Musculoskeletal: Negative.   Neurological: Negative.   Hematological: Negative.   Psychiatric/Behavioral: Negative.   All other systems reviewed and are negative.    Objective: Vital Signs: There were no vitals taken for this visit.  Physical Exam  Constitutional: She is oriented to person, place, and time. She appears well-developed and well-nourished.  Pulmonary/Chest: Effort normal.  Neurological: She is alert and oriented to person, place, and time.  Skin: Skin is warm. Capillary refill takes less than  2 seconds.  Psychiatric: She has a normal mood and affect. Her behavior is normal. Judgment and thought content normal.  Nursing note and vitals reviewed.   Ortho Exam Exam of the left hip and lower back shows tenderness palpation of the lower lumbar segments. She has no significant pain with range of motion of the hip. No focal deficits. Specialty Comments:  No specialty comments available.  Imaging: No results found.   PMFS History: Patient Active Problem List   Diagnosis Date Noted  . Diabetic polyneuropathy associated with diabetes mellitus due to underlying condition ([place]) 07/09/2015  . Vitamin deficiency related neuropathy 07/09/2015   Past Medical History:  Diagnosis Date  . Allergy    bee stings  . Asthmatic bronchitis   . Broken arm 1957/2008   right  . Cataract    had surgery   . Diabetes mellitus without complication ([facility])    prediabetes diet controlled  . Diverticulosis   . GERD (gastroesophageal reflux disease)   . Glaucoma    BIL  . Hyperlipidemia   . Hypertension   . Inflammatory arthritis   . Knee bursitis   . Migraines   . Obesity   . Osteoarthritis   . Sleep apnea     Family History  Problem Relation Age of Onset  . Colon polyps Maternal Grandfather   . Colon cancer Maternal Grandfather   .  Hypertension Mother   . Diabetes Mother   . Hypertension Father   . Diabetes Father   . Asthma Brother   . Obesity Other   . Diabetes Sister     x 2  . Diabetes Brother     prediabetes    Past Surgical History:  Procedure Laterality Date  . CARPAL TUNNEL RELEASE Left    left wrist  . CATARACT EXTRACTION W/ INTRAOCULAR LENS  IMPLANT, BILATERAL Bilateral    1999 left, 2008 right  . COLONOSCOPY    . EXCISION MORTON'S NEUROMA Left 1978   left foot  . GLAUCOMA SURGERY Bilateral    and laser surgery, 2004 left, 2008 right  . PARTIAL HYSTERECTOMY  1991   Left an ovary  . TOENAIL AVULSION Right    big toe   Social History   Occupational History   . retired    Social History Main Topics  . Smoking status: Former Smoker    Types: Cigarettes    Quit date: 08/30/1971  . Smokeless tobacco: Never Used  . Alcohol use No  . Drug use: No  . Sexual activity: Not on file

## 2016-11-08 NOTE — Telephone Encounter (Signed)
Rx was sent to the pharm I advised pt. I told her I would call pharm. Called pharm and they said its there and its ready for pick up. The pharmacist will call pt.

## 2016-11-18 ENCOUNTER — Ambulatory Visit (INDEPENDENT_AMBULATORY_CARE_PROVIDER_SITE_OTHER): Admitting: Orthopaedic Surgery

## 2016-11-24 ENCOUNTER — Ambulatory Visit
Admission: RE | Admit: 2016-11-24 | Discharge: 2016-11-24 | Disposition: A | Discharge status: Home / Self Care | Source: Ambulatory Visit | Attending: Orthopaedic Surgery | Admitting: Orthopaedic Surgery

## 2016-11-24 DIAGNOSIS — M545 Low back pain: Principal | ICD-10-CM

## 2016-11-24 DIAGNOSIS — G8929 Other chronic pain: Secondary | ICD-10-CM

## 2016-12-05 ENCOUNTER — Ambulatory Visit (INDEPENDENT_AMBULATORY_CARE_PROVIDER_SITE_OTHER): Payer: Medicare Other | Admitting: Orthopaedic Surgery

## 2016-12-05 ENCOUNTER — Encounter (INDEPENDENT_AMBULATORY_CARE_PROVIDER_SITE_OTHER): Payer: Self-pay

## 2016-12-05 ENCOUNTER — Encounter (INDEPENDENT_AMBULATORY_CARE_PROVIDER_SITE_OTHER): Payer: Self-pay | Admitting: Orthopaedic Surgery

## 2016-12-05 DIAGNOSIS — M5416 Radiculopathy, lumbar region: Secondary | ICD-10-CM

## 2016-12-05 NOTE — Addendum Note (Signed)
Addended by: Precious Bard on: 12/05/2016 03:47 PM   Modules accepted: Orders

## 2016-12-05 NOTE — Progress Notes (Signed)
Office Visit Note   Patient: Megan [name]           Date of Birth: 10-17-1945           MRN: [id number] Visit Date: 12/05/2016              Requested by: [name], MD [address] [place], [place] [address] PCP: [name], MD   Assessment & Plan: Visit Diagnoses:  1. Lumbar radiculopathy     Plan: Recommend epidural steroid injection with Dr. Ernestina Patches given the MRI findings of severe spondylosis and L5-S1 left synovial cyst with moderate spinal stenosis and lateral recess stenosis left L4-L5 with moderate foraminal stenosis  Follow-Up Instructions: Return if symptoms worsen or fail to improve.   Orders:  No orders of the defined types were placed in this encounter.  No orders of the defined types were placed in this encounter.     Procedures: No procedures performed   Clinical Data: No additional findings.   Subjective: Chief Complaint  Patient presents with  . Left Hip - Follow-up  . Lower Back - Follow-up    Patient follows up today for her lumbar MRI. She continues to have pain in her back and radiates to her left groin. Denies any focal deficits or numbness or weakness    Review of Systems  Constitutional: Negative.   HENT: Negative.   Eyes: Negative.   Respiratory: Negative.   Cardiovascular: Negative.   Endocrine: Negative.   Musculoskeletal: Negative.   Neurological: Negative.   Hematological: Negative.   Psychiatric/Behavioral: Negative.   All other systems reviewed and are negative.    Objective: Vital Signs: There were no vitals taken for this visit.  Physical Exam  Constitutional: She is oriented to person, place, and time. She appears well-developed and well-nourished.  Pulmonary/Chest: Effort normal.  Neurological: She is alert and oriented to person, place, and time.  Skin: Skin is warm. Capillary refill takes less than 2 seconds.  Psychiatric: She has a normal mood and affect. Her behavior is normal.  Judgment and thought content normal.  Nursing note and vitals reviewed.   Ortho Exam Exam of the low back and the left lower extremity is stable. Specialty Comments:  No specialty comments available.  Imaging: No results found.   PMFS History: Patient Active Problem List   Diagnosis Date Noted  . Lumbar radiculopathy 12/05/2016  . Diabetic polyneuropathy associated with diabetes mellitus due to underlying condition ([facility]) 07/09/2015  . Vitamin deficiency related neuropathy 07/09/2015   Past Medical History:  Diagnosis Date  . Allergy    bee stings  . Asthmatic bronchitis   . Broken arm 1957/2008   right  . Cataract    had surgery   . Diabetes mellitus without complication ([facility])    prediabetes diet controlled  . Diverticulosis   . GERD (gastroesophageal reflux disease)   . Glaucoma    BIL  . Hyperlipidemia   . Hypertension   . Inflammatory arthritis   . Knee bursitis   . Migraines   . Obesity   . Osteoarthritis   . Sleep apnea     Family History  Problem Relation Age of Onset  . Colon polyps Maternal Grandfather   . Colon cancer Maternal Grandfather   . Hypertension Mother   . Diabetes Mother   . Hypertension Father   . Diabetes Father   . Asthma Brother   . Obesity Other   . Diabetes Sister     x 2  .  Diabetes Brother     prediabetes    Past Surgical History:  Procedure Laterality Date  . CARPAL TUNNEL RELEASE Left    left wrist  . CATARACT EXTRACTION W/ INTRAOCULAR LENS  IMPLANT, BILATERAL Bilateral    1999 left, 2008 right  . COLONOSCOPY    . EXCISION MORTON'S NEUROMA Left 1978   left foot  . GLAUCOMA SURGERY Bilateral    and laser surgery, 2004 left, 2008 right  . PARTIAL HYSTERECTOMY  1991   Left an ovary  . TOENAIL AVULSION Right    big toe   Social History   Occupational History  . retired    Social History Main Topics  . Smoking status: Former Smoker    Types: Cigarettes    Quit date: 08/30/1971  . Smokeless tobacco: Never Used    . Alcohol use No  . Drug use: No  . Sexual activity: Not on file

## 2016-12-13 ENCOUNTER — Encounter (INDEPENDENT_AMBULATORY_CARE_PROVIDER_SITE_OTHER): Payer: Medicare Other | Admitting: Physical Medicine and Rehabilitation

## 2016-12-19 ENCOUNTER — Encounter (INDEPENDENT_AMBULATORY_CARE_PROVIDER_SITE_OTHER): Payer: Medicare Other | Admitting: Physical Medicine and Rehabilitation

## 2016-12-29 ENCOUNTER — Ambulatory Visit (INDEPENDENT_AMBULATORY_CARE_PROVIDER_SITE_OTHER): Payer: Medicare Other

## 2016-12-29 ENCOUNTER — Encounter (INDEPENDENT_AMBULATORY_CARE_PROVIDER_SITE_OTHER): Payer: Self-pay | Admitting: Physical Medicine and Rehabilitation

## 2016-12-29 ENCOUNTER — Ambulatory Visit (INDEPENDENT_AMBULATORY_CARE_PROVIDER_SITE_OTHER): Payer: Medicare Other | Admitting: Physical Medicine and Rehabilitation

## 2016-12-29 VITALS — BP 159/72 | HR 68 | Temp 98.2°F

## 2016-12-29 DIAGNOSIS — M47816 Spondylosis without myelopathy or radiculopathy, lumbar region: Secondary | ICD-10-CM

## 2016-12-29 MED ORDER — METHYLPREDNISOLONE ACETATE 80 MG/ML IJ SUSP
80.0000 mg | Freq: Once | INTRAMUSCULAR | Status: AC
  Administered 2016-12-29: 80 mg

## 2016-12-29 MED ORDER — LIDOCAINE HCL (PF) 1 % IJ SOLN
2.0000 mL | Freq: Once | INTRAMUSCULAR | Status: AC
  Administered 2016-12-29: 2 mL

## 2016-12-29 MED ORDER — IIOPAMIDOL (ISOVUE-250) INJECTION 51%
3.0000 mL | Freq: Once | INTRAVENOUS | Status: AC
  Administered 2016-12-29: 3 mL
  Filled 2016-12-29: qty 50

## 2016-12-29 NOTE — Procedures (Signed)
Lumbar Facet Joint Intra-Articular Injection(s) with Fluoroscopic Guidance  Patient: Megan [name]      Date of Birth: 1946/03/11 MRN: [id number] PCP: [name], MD      Visit Date: 12/29/2016   Universal Protocol:    Date/Time: 05/03/181:38 PM  Consent Given By: the patient  Position: PRONE   Additional Comments: Vital signs were monitored before and after the procedure. Patient was prepped and draped in the usual sterile fashion. The correct patient, procedure, and site was verified.   Injection Procedure Details:  Procedure Site One Meds Administered:  Meds ordered this encounter  Medications  . lidocaine (PF) (XYLOCAINE) 1 % injection 2 mL  . iopamidol (ISOVUE-250) 51 % injection 3 mL  . methylPREDNISolone acetate (DEPO-MEDROL) injection 80 mg     Laterality: Left  Location/Site:  L4-L5 L5-S1  Needle size: 22 guage  Needle type: Spinal  Needle Placement: Articular  Findings:  -Contrast Used: 0.5 mL iohexol 180 mg iodine/mL   -Comments: Excellent flow of contrast producing a partial arthrogram.  Procedure Details: The fluoroscope beam is vertically oriented in AP, and the inferior recess is visualized beneath the lower pole of the inferior apophyseal process, which represents the target point for needle insertion. When direct visualization is difficult the target point is located at the medial projection of the vertebral pedicle. The region overlying each aforementioned target is locally anesthetized with a 1 to 2 ml. volume of 1% Lidocaine without Epinephrine.   The spinal needle was inserted into each of the above mentioned facet joints using biplanar fluoroscopic guidance. A 0.25 to 0.5 ml. volume of Isovue-250 was injected and a partial facet joint arthrogram was obtained. A single spot film was obtained of the resulting arthrogram.    One to 1.25 ml of the steroid/anesthetic solution was then injected into each of the facet joints noted  above.   Additional Comments:  The patient tolerated the procedure well Dressing: Band-Aid    Post-procedure details: Patient was observed during the procedure. Post-procedure instructions were reviewed.  Patient left the clinic in stable condition.

## 2016-12-29 NOTE — Patient Instructions (Signed)

## 2016-12-29 NOTE — Progress Notes (Deleted)
Pain across lower back but mainly on left side. Into groin. Denies leg pain.  Constant. Doesn't relate to any certain movement or position.

## 2016-12-29 NOTE — Progress Notes (Signed)
Megan [name] - 71 y.o. female MRN [id number]  Date of birth: 20-Apr-1946  Office Visit Note: Visit Date: 12/29/2016 PCP: [name], MD Referred by: [name], MD  Subjective: Chief Complaint  Patient presents with  . Lower Back - Pain   HPI: Megan [name] is a 71 year old female followed by Dr. Wyline Copas in our office. She's been having chronic worsening left-sided axial low back pain that refers somewhat into the groin. She does not have any pain with hip rotation is evaluated her hips. She does have lower back pain across the entire lower back but predominantly left-sided. This is worse with standing and ambulating she feels like it's essentially constant. We are going to complete a diagnostic of therapeutic left L4-5 and L5-S1 facet joint block. MRI is reviewed below.    ROS Otherwise per HPI.  Assessment & Plan: Visit Diagnoses:  1. Spondylosis without myelopathy or radiculopathy, lumbar region     Plan: Findings:  Left L4-5 and L5-S1 facet joint block.    Meds & Orders:  Meds ordered this encounter  Medications  . lidocaine (PF) (XYLOCAINE) 1 % injection 2 mL  . iopamidol (ISOVUE-250) 51 % injection 3 mL  . methylPREDNISolone acetate (DEPO-MEDROL) injection 80 mg    Orders Placed This Encounter  Procedures  . Facet Injection  . XR C-ARM NO REPORT    Follow-up: Return in about 2 weeks (around 01/12/2017) for possible L4-5 interlaminar epidural injection.   Procedures: No procedures performed  Lumbar Facet Joint Intra-Articular Injection(s) with Fluoroscopic Guidance  Patient: Megan [name]      Date of Birth: [birth year] MRN: [id number] PCP: [name], MD      Visit Date: 12/29/2016   Universal Protocol:    Date/Time: 05/03/181:38 PM  Consent Given By: the patient  Position: PRONE   Additional Comments: Vital signs were monitored before and after the procedure. Patient was prepped and draped in the usual sterile fashion. The correct  patient, procedure, and site was verified.   Injection Procedure Details:  Procedure Site One Meds Administered:  Meds ordered this encounter  Medications  . lidocaine (PF) (XYLOCAINE) 1 % injection 2 mL  . iopamidol (ISOVUE-250) 51 % injection 3 mL  . methylPREDNISolone acetate (DEPO-MEDROL) injection 80 mg     Laterality: Left  Location/Site:  L4-L5 L5-S1  Needle size: 22 guage  Needle type: Spinal  Needle Placement: Articular  Findings:  -Contrast Used: 0.5 mL iohexol 180 mg iodine/mL   -Comments: Excellent flow of contrast producing a partial arthrogram.  Procedure Details: The fluoroscope beam is vertically oriented in AP, and the inferior recess is visualized beneath the lower pole of the inferior apophyseal process, which represents the target point for needle insertion. When direct visualization is difficult the target point is located at the medial projection of the vertebral pedicle. The region overlying each aforementioned target is locally anesthetized with a 1 to 2 ml. volume of 1% Lidocaine without Epinephrine.   The spinal needle was inserted into each of the above mentioned facet joints using biplanar fluoroscopic guidance. A 0.25 to 0.5 ml. volume of Isovue-250 was injected and a partial facet joint arthrogram was obtained. A single spot film was obtained of the resulting arthrogram.    One to 1.25 ml of the steroid/anesthetic solution was then injected into each of the facet joints noted above.   Additional Comments:  The patient tolerated the procedure well Dressing: Band-Aid    Post-procedure details: Patient was observed during the procedure. Post-procedure  instructions were reviewed.  Patient left the clinic in stable condition.     Clinical History: Lumbar spine MRIIMPRESSION: 1. Diffuse disc and facet degeneration with moderate progression since 2006. 2. L5-S1 left facet synovial cyst impinging on the S1 nerve root from behind. 3. L4-5  moderate spinal stenosis from posterior element hypertrophy and disc bulging. Left more than right subarticular recess narrowing. Mild to moderate left foraminal stenosis. 4. L3-4 facet arthropathy with slight anterolisthesis but no impingement. 5. Noncompressive disc protrusions at T12-L1 and L2-3.   Electronically Signed   By: Monte Fantasia M.D.   On: 11/24/2016 11:01  She reports that she quit smoking about 45 years ago. Her smoking use included Cigarettes. She has never used smokeless tobacco. No results for input(s): HGBA1C, LABURIC in the last 8760 hours.  Objective:  VS:  HT:    WT:   BMI:     BP:(!) 159/72  HR:68bpm  TEMP:98.2 F (36.8 C)(Oral)  RESP:99 % Physical Exam  Musculoskeletal:  Concordant low back pain with extension rotation of the lumbar spine. No pain over the greater trochanters and good distal strength.    Ortho Exam Imaging: No results found.  Past Medical/Family/Surgical/Social History: Medications & Allergies reviewed per EMR Patient Active Problem List   Diagnosis Date Noted  . Lumbar radiculopathy 12/05/2016  . Diabetic polyneuropathy associated with diabetes mellitus due to underlying condition ([place]) 07/09/2015  . Vitamin deficiency related neuropathy 07/09/2015   Past Medical History:  Diagnosis Date  . Allergy    bee stings  . Asthmatic bronchitis   . Broken arm 1957/2008   right  . Cataract    had surgery   . Diabetes mellitus without complication ([place])    prediabetes diet controlled  . Diverticulosis   . GERD (gastroesophageal reflux disease)   . Glaucoma    BIL  . Hyperlipidemia   . Hypertension   . Inflammatory arthritis   . Knee bursitis   . Migraines   . Obesity   . Osteoarthritis   . Sleep apnea    Family History  Problem Relation Age of Onset  . Colon polyps Maternal Grandfather   . Colon cancer Maternal Grandfather   . Hypertension Mother   . Diabetes Mother   . Hypertension Father   . Diabetes Father   .  Asthma Brother   . Obesity Other   . Diabetes Sister     x 2  . Diabetes Brother     prediabetes   Past Surgical History:  Procedure Laterality Date  . CARPAL TUNNEL RELEASE Left    left wrist  . CATARACT EXTRACTION W/ INTRAOCULAR LENS  IMPLANT, BILATERAL Bilateral    1999 left, 2008 right  . COLONOSCOPY    . EXCISION MORTON'S NEUROMA Left 1978   left foot  . GLAUCOMA SURGERY Bilateral    and laser surgery, 2004 left, 2008 right  . PARTIAL HYSTERECTOMY  1991   Left an ovary  . TOENAIL AVULSION Right    big toe   Social History   Occupational History  . retired    Social History Main Topics  . Smoking status: Former Smoker    Types: Cigarettes    Quit date: 08/30/1971  . Smokeless tobacco: Never Used  . Alcohol use No  . Drug use: No  . Sexual activity: Not on file

## 2017-01-10 ENCOUNTER — Ambulatory Visit (INDEPENDENT_AMBULATORY_CARE_PROVIDER_SITE_OTHER): Payer: Medicare Other | Admitting: Podiatry

## 2017-01-10 DIAGNOSIS — B351 Tinea unguium: Secondary | ICD-10-CM

## 2017-01-10 DIAGNOSIS — E1140 Type 2 diabetes mellitus with diabetic neuropathy, unspecified: Secondary | ICD-10-CM

## 2017-01-10 DIAGNOSIS — M79674 Pain in right toe(s): Secondary | ICD-10-CM

## 2017-01-10 NOTE — Progress Notes (Signed)
Patient ID: Megan [name], female   DOB: 1945-11-27, 70 y.o.   MRN: [id number] Complaint:  Visit Type: Patient returns to my office for continued preventative foot care services. Complaint: Patient states" my nails have grown long and thick and become painful to walk and wear shoes" Patient has been diagnosed with DM . This patient also says she is having pain and bleeding from her big toenails both feet.    Podiatric Exam: Vascular: dorsalis pedis and posterior tibial pulses are not  palpable bilateral due to swelling both feet.. Capillary return is immediate. Temperature gradient is WNL. Skin turgor WNL  Sensorium: Normal Semmes Weinstein monofilament test. Normal tactile sensation bilaterally. Nail Exam: Pt has thick disfigured discolored nails with subungual debris noted bilateral entire nail hallux through fifth toenails.  Nail plate both hallux have  separated from nail bed region.  No redness or swelling noted. Ulcer Exam: There is no evidence of ulcer or pre-ulcerative changes or infection. Orthopedic Exam: Muscle tone and strength are WNL. No limitations in general ROM. No crepitus or effusions noted. Foot type and digits show no abnormalities. Bony prominences are unremarkable. Skin: No Porokeratosis. No infection or ulcers  Diagnosis:  Onychomycosis, , Pain in right toe, pain in left toes  Treatment & Plan Procedures and Treatment: Consent by patient was obtained for treatment procedures. The patient understood the discussion of treatment and procedures well. All questions were answered thoroughly reviewed. Debridement of mycotic and hypertrophic toenails, 1 through 5 bilateral and clearing of subungual debris. No ulceration, no infection noted. Patient says she has pain and points to an area beyond her nail at the tip right big toe. Padding dispensed. Return Visit-Office Procedure: Patient instructed to return to the office for a follow up visit 10 weeks for continued evaluation and  treatment.

## 2017-02-10 ENCOUNTER — Ambulatory Visit: Admitting: Neurology

## 2017-03-21 ENCOUNTER — Ambulatory Visit (INDEPENDENT_AMBULATORY_CARE_PROVIDER_SITE_OTHER): Payer: Medicare Other | Admitting: Podiatry

## 2017-03-21 ENCOUNTER — Encounter: Payer: Self-pay | Admitting: Podiatry

## 2017-03-21 DIAGNOSIS — E1140 Type 2 diabetes mellitus with diabetic neuropathy, unspecified: Secondary | ICD-10-CM | POA: Diagnosis not present

## 2017-03-21 DIAGNOSIS — B351 Tinea unguium: Secondary | ICD-10-CM | POA: Diagnosis not present

## 2017-03-21 DIAGNOSIS — M79674 Pain in right toe(s): Secondary | ICD-10-CM

## 2017-03-21 DIAGNOSIS — M79676 Pain in unspecified toe(s): Secondary | ICD-10-CM | POA: Diagnosis not present

## 2017-03-21 NOTE — Progress Notes (Signed)
Patient ID: Megan [name], female   DOB: 03-07-1946, 71 y.o.   MRN: [id number] Complaint:  Visit Type: Patient returns to my office for continued preventative foot care services. Complaint: Patient states" my nails have grown long and thick and become painful to walk and wear shoes" Patient has been diagnosed with DM . Patient says her big toe nails catch her socks.  Podiatric Exam: Vascular: dorsalis pedis and posterior tibial pulses are not  palpable bilateral due to swelling both feet.. Capillary return is immediate. Temperature gradient is WNL. Skin turgor WNL  Sensorium: Normal Semmes Weinstein monofilament test. Normal tactile sensation bilaterally. Nail Exam: Pt has thick disfigured discolored nails with subungual debris noted bilateral entire nail hallux through fifth toenails.  Nail plate both hallux have  separated from nail bed region.  No redness or swelling noted. Ulcer Exam: There is no evidence of ulcer or pre-ulcerative changes or infection. Orthopedic Exam: Muscle tone and strength are WNL. No limitations in general ROM. No crepitus or effusions noted. Foot type and digits show no abnormalities. Bony prominences are unremarkable. Skin: No Porokeratosis. No infection or ulcers  Diagnosis:  Onychomycosis, , Pain in right toe, pain in left toes  Treatment & Plan Procedures and Treatment: Consent by patient was obtained for treatment procedures. The patient understood the discussion of treatment and procedures well. All questions were answered thoroughly reviewed. Debridement of mycotic and hypertrophic toenails, 1 through 5 bilateral and clearing of subungual debris. No ulceration, no infection noted.  Return Visit-Office Procedure: Patient instructed to return to the office for a follow up visit 10 weeks for continued evaluation and treatment.   Gardiner Barefoot DPM

## 2017-04-06 ENCOUNTER — Other Ambulatory Visit: Payer: Self-pay | Admitting: Neurology

## 2017-05-25 ENCOUNTER — Other Ambulatory Visit: Payer: Self-pay | Admitting: Neurology

## 2017-05-30 ENCOUNTER — Encounter: Payer: Self-pay | Admitting: Podiatry

## 2017-05-30 ENCOUNTER — Ambulatory Visit: Payer: Medicare Other

## 2017-05-30 ENCOUNTER — Ambulatory Visit (INDEPENDENT_AMBULATORY_CARE_PROVIDER_SITE_OTHER): Payer: Medicare Other | Admitting: Podiatry

## 2017-05-30 ENCOUNTER — Ambulatory Visit (INDEPENDENT_AMBULATORY_CARE_PROVIDER_SITE_OTHER): Payer: Medicare Other

## 2017-05-30 ENCOUNTER — Other Ambulatory Visit: Payer: Self-pay | Admitting: Podiatry

## 2017-05-30 ENCOUNTER — Ambulatory Visit: Payer: Medicare Other | Admitting: Podiatry

## 2017-05-30 DIAGNOSIS — M79676 Pain in unspecified toe(s): Secondary | ICD-10-CM

## 2017-05-30 DIAGNOSIS — M25572 Pain in left ankle and joints of left foot: Secondary | ICD-10-CM | POA: Diagnosis not present

## 2017-05-30 DIAGNOSIS — B351 Tinea unguium: Secondary | ICD-10-CM | POA: Diagnosis not present

## 2017-05-30 DIAGNOSIS — E1140 Type 2 diabetes mellitus with diabetic neuropathy, unspecified: Secondary | ICD-10-CM

## 2017-05-30 DIAGNOSIS — M7661 Achilles tendinitis, right leg: Secondary | ICD-10-CM

## 2017-05-30 DIAGNOSIS — M7662 Achilles tendinitis, left leg: Secondary | ICD-10-CM | POA: Diagnosis not present

## 2017-05-30 DIAGNOSIS — R52 Pain, unspecified: Secondary | ICD-10-CM

## 2017-05-30 MED ORDER — NONFORMULARY OR COMPOUNDED ITEM
3 refills | Status: DC
Start: 2017-05-30 — End: 2017-05-30

## 2017-05-30 MED ORDER — MELOXICAM 7.5 MG PO TABS: 7.5000 mg | ORAL_TABLET | Freq: Every day | ORAL | 0 refills | Status: DC

## 2017-05-30 MED ORDER — NONFORMULARY OR COMPOUNDED ITEM: 3 refills | Status: DC

## 2017-05-30 NOTE — Progress Notes (Signed)
Patient ID: Megan [name], female   DOB: [birth year], 71 y.o.   MRN: [id number] Complaint:  Visit Type: Patient returns to my office for continued preventative foot care services. Complaint: Patient states" my nails have grown long and thick and become painful to walk and wear shoes" Patient has been diagnosed with DM . Patient  states that her left ankle is painful when she walks.  She says when she wakes in the morning her left ankle is very stiff.  Patient does admit to mild swelling noted to the left ankle.  She requests that her left ankle be evaluated after her preventative foot care services have been provided  Podiatric Exam: Vascular: dorsalis pedis and posterior tibial pulses are not  palpable bilateral due to swelling both feet.. Capillary return is immediate. Temperature gradient is WNL. Skin turgor WNL  Sensorium: Normal Semmes Weinstein monofilament test. Normal tactile sensation bilaterally. Nail Exam: Pt has thick disfigured discolored nails with subungual debris noted bilateral entire nail hallux through fifth toenails.  Nail plate both hallux have  separated from nail bed region.  No redness or swelling noted. Ulcer Exam: There is no evidence of ulcer or pre-ulcerative changes or infection. Orthopedic Exam: Muscle tone and strength are WNL. No limitations in general ROM. No crepitus or effusions noted. Foot type and digits show no abnormalities. Palpable pain noted in the sinus tarsi of the left foot.  Palpable pain noted to the anterior aspect of the left ankle.  Patient also has pain at the insertion of the Achilles tendon left foot.  Patient is guarding her left foot as compared to her right foot. Skin: No Porokeratosis. No infection or ulcers  Diagnosis:  Onychomycosis, , Pain in right toe, pain in left toes,  Achilles tendinitis  Sinus tarsitis  Left foot.  Treatment & Plan Procedures and Treatment: Consent by patient was obtained for treatment procedures. The patient  understood the discussion of treatment and procedures well. All questions were answered thoroughly reviewed. Debridement of mycotic and hypertrophic toenails, 1 through 5 bilateral and clearing of subungual debris. No ulceration, no infection noted. X-rays taken do reveal calcification at the insertion of the plantar fascia left heel.  No evidence of calcification at the insertion of the Achilles tendon left heel.  No evidence of any arthritic changes noted on the AP and mortise ankle views.  Discussed her pain with this patient.  I described conservative care versus immobilization.  She says that her husband is under cancer care in [place] and she will be unable to be immobilized.  Therefore, she opted for anti-inflammatory medication.  Prescribed Mobic 7.5 mg to be taken by mouth.  Also prescribed medication for her to apply to her Achilles tendon from [employer]. Return Visit-Office Procedure: Patient instructed to return to the office for a follow up visit 10 weeks for preventative foot care services.  She has returned to return the office in 10 days for evaluation of her ankle tendinitis   Gardiner Barefoot DPM

## 2017-06-20 ENCOUNTER — Ambulatory Visit (INDEPENDENT_AMBULATORY_CARE_PROVIDER_SITE_OTHER): Payer: Medicare Other | Admitting: Podiatry

## 2017-06-20 ENCOUNTER — Encounter: Payer: Self-pay | Admitting: Podiatry

## 2017-06-20 DIAGNOSIS — M7661 Achilles tendinitis, right leg: Secondary | ICD-10-CM

## 2017-06-20 DIAGNOSIS — M7662 Achilles tendinitis, left leg: Secondary | ICD-10-CM | POA: Diagnosis not present

## 2017-06-20 DIAGNOSIS — E1140 Type 2 diabetes mellitus with diabetic neuropathy, unspecified: Secondary | ICD-10-CM | POA: Diagnosis not present

## 2017-06-20 NOTE — Progress Notes (Signed)
This patient returns to the office for continued evaluation and treatment of her painful left rear foot.  She was diagnosed with Achilles tendinitis and a sinus tarsitis of her left foot about 3 weeks ago. She was prescribed Mobic 7.5 with instructions to take 1 daily.  She has been cutting her 15 mg, Mobic and half and taking only one half of the tablet.  She states that she is not having any pain or problems with her GI system.  This patient is also very active. Due to the fact that her husband has developed multiple myeloma and she has daily doctor visits at different facilities every day.  This is the reason we did not treat her with immobilization. She states that she is 20-30% better with the medication but still feels stiffness occurring in her left ankle. She presents the office today for continued evaluation and treatment of her left foot/ankle.   General Appearance  Alert, conversant and in no acute stress.  Vascular  Dorsalis pedis and posterior pulses are palpable  bilaterally.  Capillary return is within normal limits  Bilaterally. Temperature is within normal limits  Bilaterally  Neurologic  Senn-Weinstein monofilament wire test within normal limits  bilaterally. Muscle power  Within normal limits bilaterally.  Nails Thick disfigured discolored nails with subungual debride bilaterally from hallux to fifth toes bilaterally. No evidence of bacterial infection or drainage bilaterally.  Orthopedic  No limitations of motion of motion feet bilaterally.  No crepitus or effusions noted.  No bony pathology or digital deformities noted. Pain in the sinus tarsus and achilles tendon insertion has resolved.  Swelling has diminished.    Skin  normotropic skin with no porokeratosis noted bilaterally.  No signs of infections or ulcers noted.      Sinus tarsitis, left   Achilles tendinitis left.   Return office visit.  Evaluation of her foot reveals decreased swelling and pain in the left foot/ankle.   Patient says she is still under considerable pressure. Due to her husband's cancer.  Therefore, she says she still wants she takes the medication instead of being immobilized.  She was told to take Mobic 15 mg daily.  She was also instructed to return to the office as needed for pain due to her tendinitis.   Gardiner Barefoot DPM

## 2017-07-31 DIAGNOSIS — H903 Sensorineural hearing loss, bilateral: Secondary | ICD-10-CM | POA: Insufficient documentation

## 2017-09-13 ENCOUNTER — Encounter: Payer: Self-pay | Admitting: Family Medicine

## 2017-11-15 ENCOUNTER — Encounter: Payer: Self-pay | Admitting: Podiatry

## 2017-11-15 ENCOUNTER — Ambulatory Visit (INDEPENDENT_AMBULATORY_CARE_PROVIDER_SITE_OTHER): Payer: Medicare Other | Admitting: Podiatry

## 2017-11-15 DIAGNOSIS — M79674 Pain in right toe(s): Secondary | ICD-10-CM | POA: Diagnosis not present

## 2017-11-15 DIAGNOSIS — E1140 Type 2 diabetes mellitus with diabetic neuropathy, unspecified: Secondary | ICD-10-CM | POA: Diagnosis not present

## 2017-11-15 DIAGNOSIS — M79675 Pain in left toe(s): Secondary | ICD-10-CM | POA: Diagnosis not present

## 2017-11-15 DIAGNOSIS — Q828 Other specified congenital malformations of skin: Secondary | ICD-10-CM | POA: Diagnosis not present

## 2017-11-15 DIAGNOSIS — B351 Tinea unguium: Secondary | ICD-10-CM

## 2017-11-15 NOTE — Progress Notes (Addendum)
Patient ID: Megan [name], female   DOB: September 28, 1945, 72 y.o.   MRN: [id number] Complaint:  Visit Type: Patient returns to my office for continued preventative foot care services. Complaint: Patient states" my nails have grown long and thick and become painful to walk and wear shoes" Patient has been diagnosed with DM . Patient says she has pain from callus under big toe joint right foot.  Podiatric Exam: Vascular: dorsalis pedis and posterior tibial pulses are not  palpable bilateral due to swelling both feet.. Capillary return is immediate. Temperature gradient is WNL. Skin turgor WNL  Sensorium: Normal Semmes Weinstein monofilament test. Normal tactile sensation bilaterally. Nail Exam: Pt has thick disfigured discolored nails with subungual debris noted bilateral entire nail hallux through fifth toenails.  Nail plate both hallux have  separated from nail bed region.  No redness or swelling noted. Ulcer Exam: There is no evidence of ulcer or pre-ulcerative changes or infection. Orthopedic Exam: Muscle tone and strength are WNL. No limitations in general ROM. No crepitus or effusions noted. Foot type and digits show no abnormalities. Bony prominences are unremarkable. Skin: No Porokeratosis. No infection or ulcers.  Porokeratosis right foot  Diagnosis:  Onychomycosis, , Pain in right toe, pain in left toes  Treatment & Plan Procedures and Treatment: Consent by patient was obtained for treatment procedures. The patient understood the discussion of treatment and procedures well. All questions were answered thoroughly reviewed. Debridement of mycotic and hypertrophic toenails, 1 through 5 bilateral and clearing of subungual debris. No ulceration, no infection noted. Debride porokeratosis  Right foot.  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 Return Visit-Office Procedure: Patient instructed to return to the office for a follow up visit 10 weeks for continued  evaluation and treatment.   Gardiner Barefoot DPM

## 2017-11-23 ENCOUNTER — Ambulatory Visit: Payer: Medicare Other | Admitting: Orthotics

## 2017-11-23 DIAGNOSIS — M79674 Pain in right toe(s): Principal | ICD-10-CM

## 2017-11-23 DIAGNOSIS — M7661 Achilles tendinitis, right leg: Secondary | ICD-10-CM

## 2017-11-23 DIAGNOSIS — M79675 Pain in left toe(s): Principal | ICD-10-CM

## 2017-11-23 DIAGNOSIS — E1140 Type 2 diabetes mellitus with diabetic neuropathy, unspecified: Secondary | ICD-10-CM

## 2017-11-23 DIAGNOSIS — B351 Tinea unguium: Secondary | ICD-10-CM

## 2017-11-23 DIAGNOSIS — M7662 Achilles tendinitis, left leg: Secondary | ICD-10-CM

## 2017-11-23 NOTE — Progress Notes (Signed)
Patient presents today for diabetic shoe measurement and foam casting.  Goals of diabetic shoes/inserts to offer protection from conditions secondary to DM2, offer relief from sheer forces that could lead to ulcerations, protect the foot, and offer greater stability. Patient is under supervision of DPM [facility] Physician managing patients DM2: Patient has following documented conditions to qualify for diabetic shoes/inserts:  Dm2  Preulcerative callus Patient measured with brannock device: 10W [facility] 839

## 2017-12-13 ENCOUNTER — Other Ambulatory Visit: Payer: Medicare Other | Admitting: Orthotics

## 2017-12-18 ENCOUNTER — Other Ambulatory Visit: Payer: Medicare Other | Admitting: Orthotics

## 2018-01-10 ENCOUNTER — Other Ambulatory Visit: Payer: Medicare Other | Admitting: Orthotics

## 2018-01-17 ENCOUNTER — Ambulatory Visit (INDEPENDENT_AMBULATORY_CARE_PROVIDER_SITE_OTHER): Payer: Medicare Other | Admitting: Orthotics

## 2018-01-17 DIAGNOSIS — E0842 Diabetes mellitus due to underlying condition with diabetic polyneuropathy: Secondary | ICD-10-CM | POA: Diagnosis not present

## 2018-01-17 DIAGNOSIS — E1140 Type 2 diabetes mellitus with diabetic neuropathy, unspecified: Secondary | ICD-10-CM | POA: Diagnosis not present

## 2018-01-17 DIAGNOSIS — M2580 Other specified joint disorders, unspecified joint: Secondary | ICD-10-CM | POA: Diagnosis not present

## 2018-01-17 DIAGNOSIS — Q828 Other specified congenital malformations of skin: Secondary | ICD-10-CM

## 2018-01-17 NOTE — Progress Notes (Signed)
Patient came in today to pick up diabetic shoes and custom inserts.  Same was well pleased with fit and function.   The patient could ambulate without any discomfort; there were no signs of any quality issues. The foot ortheses offered full contact with plantar surface and contoured the arch well.   The shoes fit well with no heel slippage and areas of pressure concern.   Patient advised to contact us if any problems arise.  Patient also advised on how to report any issues..  I also offloaded first met head R painful callus.

## 2018-02-14 ENCOUNTER — Ambulatory Visit: Payer: Medicare Other | Admitting: Podiatry

## 2018-03-28 ENCOUNTER — Ambulatory Visit: Payer: Medicare Other | Admitting: Podiatry

## 2018-04-03 ENCOUNTER — Ambulatory Visit (INDEPENDENT_AMBULATORY_CARE_PROVIDER_SITE_OTHER): Payer: Medicare Other | Admitting: Podiatry

## 2018-04-03 ENCOUNTER — Encounter

## 2018-04-03 ENCOUNTER — Encounter: Payer: Self-pay | Admitting: Podiatry

## 2018-04-03 ENCOUNTER — Other Ambulatory Visit: Payer: Self-pay

## 2018-04-03 ENCOUNTER — Encounter: Payer: Self-pay | Admitting: Internal Medicine

## 2018-04-03 DIAGNOSIS — E1140 Type 2 diabetes mellitus with diabetic neuropathy, unspecified: Secondary | ICD-10-CM | POA: Diagnosis not present

## 2018-04-03 DIAGNOSIS — B351 Tinea unguium: Secondary | ICD-10-CM

## 2018-04-03 DIAGNOSIS — Q828 Other specified congenital malformations of skin: Secondary | ICD-10-CM

## 2018-04-03 DIAGNOSIS — M79675 Pain in left toe(s): Secondary | ICD-10-CM

## 2018-04-03 DIAGNOSIS — M79674 Pain in right toe(s): Secondary | ICD-10-CM

## 2018-04-03 NOTE — Progress Notes (Signed)
Patient ID: Megan [name], female   DOB: [birth year], 72 y.o.   MRN: [id number] Complaint:  Visit Type: Patient returns to my office for continued preventative foot care services. Complaint: Patient states" my nails have grown long and thick and become painful to walk and wear shoes" Patient has been diagnosed with DM . Patient says she has pain from callus under big toe joint right foot. Patient has lost her husband since last visit.  Podiatric Exam: Vascular: dorsalis pedis and posterior tibial pulses are not  palpable bilateral due to swelling both feet.. Capillary return is immediate. Temperature gradient is WNL. Skin turgor WNL  Sensorium: Normal Semmes Weinstein monofilament test. Normal tactile sensation bilaterally. Nail Exam: Pt has thick disfigured discolored nails with subungual debris noted bilateral entire nail hallux through fifth toenails.  Nail plate both hallux have  separated from nail bed region.  No redness or swelling noted. Ulcer Exam: There is no evidence of ulcer or pre-ulcerative changes or infection. Orthopedic Exam: Muscle tone and strength are WNL. No limitations in general ROM. No crepitus or effusions noted. Foot type and digits show no abnormalities. Bony prominences are unremarkable. Skin: No Porokeratosis. No infection or ulcers.  Porokeratosis right foot  Diagnosis:  Onychomycosis, , Pain in right toe, pain in left toes  Treatment & Plan Procedures and Treatment: Consent by patient was obtained for treatment procedures. The patient understood the discussion of treatment and procedures well. All questions were answered thoroughly reviewed. Debridement of mycotic and hypertrophic toenails, 1 through 5 bilateral and clearing of subungual debris. No ulceration, no infection noted. Debride porokeratosis  Right foot. Return Visit-Office Procedure: Patient instructed to return to the office for a follow up visit 10 weeks for continued evaluation and  treatment.   Gardiner Barefoot DPM

## 2018-05-18 ENCOUNTER — Ambulatory Visit (AMBULATORY_SURGERY_CENTER): Payer: Self-pay

## 2018-05-18 ENCOUNTER — Other Ambulatory Visit: Payer: Self-pay

## 2018-05-18 VITALS — Ht 62.5 in | Wt 249.2 lb

## 2018-05-18 DIAGNOSIS — Z86010 Personal history of colonic polyps: Secondary | ICD-10-CM

## 2018-05-18 NOTE — Progress Notes (Signed)
No egg or soy allergy known to patient  No issues with past sedation with any surgeries  or procedures, no intubation problems  No diet pills per patient No home 02 use per patient  No blood thinners per patient  Pt denies issues with constipation  No A fib or A flutter  EMMI video sent to pt's e mail , pt declined    

## 2018-05-29 DIAGNOSIS — E039 Hypothyroidism, unspecified: Secondary | ICD-10-CM

## 2018-05-29 HISTORY — DX: Hypothyroidism, unspecified: E03.9

## 2018-05-31 ENCOUNTER — Ambulatory Visit (AMBULATORY_SURGERY_CENTER): Payer: Medicare Other | Admitting: Internal Medicine

## 2018-05-31 ENCOUNTER — Encounter: Payer: Self-pay | Admitting: Internal Medicine

## 2018-05-31 VITALS — BP 172/76 | HR 65 | Temp 98.9°F | Resp 11 | Ht 62.0 in | Wt 249.0 lb

## 2018-05-31 DIAGNOSIS — Z86010 Personal history of colonic polyps: Secondary | ICD-10-CM | POA: Diagnosis not present

## 2018-05-31 DIAGNOSIS — D123 Benign neoplasm of transverse colon: Secondary | ICD-10-CM

## 2018-05-31 MED ORDER — SODIUM CHLORIDE 0.9 % IV SOLN: 500.0000 mL | Freq: Once | INTRAVENOUS | Status: DC

## 2018-05-31 NOTE — Op Note (Signed)
[facility] Patient Name: Megan [name] Procedure Date: 05/31/2018 8:27 AM MRN: [id number] Endoscopist: Gatha Mayer , MD Age: 72 Referring MD:  Date of Birth: April 22, 1946 Gender: Female Account #: [id number] Procedure:                Colonoscopy Indications:              Surveillance: Personal history of adenomatous                            polyps on last colonoscopy > 5 years ago Medicines:                Propofol per Anesthesia, Monitored Anesthesia Care Procedure:                Pre-Anesthesia Assessment:                           - Prior to the procedure, a History and Physical                            was performed, and patient medications and                            allergies were reviewed. The patient's tolerance of                            previous anesthesia was also reviewed. The risks                            and benefits of the procedure and the sedation                            options and risks were discussed with the patient.                            All questions were answered, and informed consent                            was obtained. Prior Anticoagulants: The patient has                            taken no previous anticoagulant or antiplatelet                            agents. ASA Grade Assessment: III - A patient with                            severe systemic disease. After reviewing the risks                            and benefits, the patient was deemed in                            satisfactory condition to undergo the procedure.  After obtaining informed consent, the colonoscope                            was passed under direct vision. Throughout the                            procedure, the patient's blood pressure, pulse, and                            oxygen saturations were monitored continuously. The                            Colonoscope was introduced through the anus and        advanced to the the cecum, identified by                            appendiceal orifice and ileocecal valve. The                            colonoscopy was performed without difficulty. The                            patient tolerated the procedure well. The quality                            of the bowel preparation was good. The ileocecal                            valve, appendiceal orifice, and rectum were                            photographed. The bowel preparation used was                            Miralax. Scope In: 8:37:16 AM Scope Out: 8:50:36 AM Scope Withdrawal Time: 0 hours 11 minutes 59 seconds  Total Procedure Duration: 0 hours 13 minutes 20 seconds  Findings:                 The perianal and digital rectal examinations were                            normal.                           Two sessile polyps were found in the distal                            transverse colon. The polyps were 1 to 2 mm in                            size. These polyps were removed with a cold biopsy                            forceps. Resection  and retrieval were complete.                            Verification of patient identification for the                            specimen was done. Estimated blood loss was minimal.                           Multiple diverticula were found in the sigmoid                            colon.                           The exam was otherwise without abnormality on                            direct and retroflexion views. Complications:            No immediate complications. Estimated Blood Loss:     Estimated blood loss was minimal. Impression:               - Two 1 to 2 mm polyps in the distal transverse                            colon, removed with a cold biopsy forceps. Resected                            and retrieved.                           - Diverticulosis in the sigmoid colon.                           - The examination was otherwise normal  on direct                            and retroflexion views.                           - Personal history of colonic polyp 6 mm adenomas                            2012. Recommendation:           - Patient has a contact number available for                            emergencies. The signs and symptoms of potential                            delayed complications were discussed with the                            patient. Return to normal activities tomorrow.  Written discharge instructions were provided to the                            patient.                           - Resume previous diet.                           - Continue present medications.                           - Repeat colonoscopy is recommended for                            surveillance. The colonoscopy date will be                            determined after pathology results from today's                            exam become available for review. Gatha Mayer, MD 05/31/2018 8:57:02 AM This report has been signed electronically.

## 2018-05-31 NOTE — Patient Instructions (Addendum)
I found and removed 2 tiny polyps that look benign.  I will let you know pathology results and when to have another routine colonoscopy by mail and/or My Chart.  You also have a condition called diverticulosis - common and not usually a problem. Please read the handout provided.  I appreciate the opportunity to care for you. Gatha Mayer, MD, FACG  YOU HAD AN ENDOSCOPIC PROCEDURE TODAY AT [facility]:   Refer to the procedure report that was given to you for any specific questions about what was found during the examination.  If the procedure report does not answer your questions, please call your gastroenterologist to clarify.  If you requested that your care partner not be given the details of your procedure findings, then the procedure report has been included in a sealed envelope for you to review at your convenience later.  YOU SHOULD EXPECT: Some feelings of bloating in the abdomen. Passage of more gas than usual.  Walking can help get rid of the air that was put into your GI tract during the procedure and reduce the bloating. If you had a lower endoscopy (such as a colonoscopy or flexible sigmoidoscopy) you may notice spotting of blood in your stool or on the toilet paper. If you underwent a bowel prep for your procedure, you may not have a normal bowel movement for a few days.  Please Note:  You might notice some irritation and congestion in your nose or some drainage.  This is from the oxygen used during your procedure.  There is no need for concern and it should clear up in a day or so.  SYMPTOMS TO REPORT IMMEDIATELY:   Following lower endoscopy (colonoscopy or flexible sigmoidoscopy):  Excessive amounts of blood in the stool  Significant tenderness or worsening of abdominal pains  Swelling of the abdomen that is new, acute  Fever of 100F or higher   For urgent or emergent issues, a gastroenterologist can be reached at any hour by calling [phone]  [phone].   DIET:  We do recommend a small meal at first, but then you may proceed to your regular diet.  Drink plenty of fluids but you should avoid alcoholic beverages for 24 hours.  ACTIVITY:  You should plan to take it easy for the rest of today and you should NOT DRIVE or use heavy machinery until tomorrow (because of the sedation medicines used during the test).    FOLLOW UP: Our staff will call the number listed on your records the next business day following your procedure to check on you and address any questions or concerns that you may have regarding the information given to you following your procedure. If we do not reach you, we will leave a message.  However, if you are feeling well and you are not experiencing any problems, there is no need to return our call.  We will assume that you have returned to your regular daily activities without incident.  If any biopsies were taken you will be contacted by phone or by letter within the next 1-3 weeks.  Please call us at [phone] if you have not heard about the biopsies in 3 weeks.    SIGNATURES/CONFIDENTIALITY: You and/or your care partner have signed paperwork which will be entered into your electronic medical record.  These signatures attest to the fact that that the information above on your After Visit Summary has been reviewed and is understood.  Full responsibility of the confidentiality  of this discharge information lies with you and/or your care-partner.

## 2018-05-31 NOTE — Progress Notes (Signed)
Pt's states no medical or surgical changes since previsit or office visit. 

## 2018-05-31 NOTE — Progress Notes (Signed)
Alert and oriented x 3, pleased with MAC, report to RN

## 2018-05-31 NOTE — Progress Notes (Signed)
Called to room to assist during endoscopic procedure.  Patient ID and intended procedure confirmed with present staff. Received instructions for my participation in the procedure from the performing physician.  

## 2018-06-01 ENCOUNTER — Telehealth: Payer: Self-pay

## 2018-06-01 NOTE — Telephone Encounter (Signed)
  Follow up Call-  Call back number 05/31/2018  Post procedure Call Back phone  # [phone] hm  Permission to leave phone message Yes  Some recent data might be hidden     Patient questions:  Do you have a fever, pain , or abdominal swelling? No. Pain Score  0 *  Have you tolerated food without any problems? Yes.    Have you been able to return to your normal activities? Yes.    Do you have any questions about your discharge instructions: Diet   No. Medications  No. Follow up visit  No.  Do you have questions or concerns about your Care? Yes, patient still has minimal gas cramping this morning. Advised her to drink warm fluids and to take a "Gas Ex" if needed. Also advised her to call back if it worsened or did not resolve.   Actions: * If pain score is 4 or above: No action needed, pain <4.

## 2018-06-15 ENCOUNTER — Encounter: Payer: Self-pay | Admitting: Internal Medicine

## 2018-06-15 DIAGNOSIS — Z86010 Personal history of colonic polyps: Secondary | ICD-10-CM

## 2018-06-15 NOTE — Progress Notes (Signed)
2 adenomas Recall for possible colonoscopy 5 years 2024

## 2018-06-19 ENCOUNTER — Encounter: Payer: Self-pay | Admitting: Podiatry

## 2018-06-19 ENCOUNTER — Ambulatory Visit (INDEPENDENT_AMBULATORY_CARE_PROVIDER_SITE_OTHER): Payer: Medicare Other | Admitting: Podiatry

## 2018-06-19 DIAGNOSIS — B351 Tinea unguium: Secondary | ICD-10-CM | POA: Diagnosis not present

## 2018-06-19 DIAGNOSIS — Q828 Other specified congenital malformations of skin: Secondary | ICD-10-CM

## 2018-06-19 DIAGNOSIS — M79675 Pain in left toe(s): Secondary | ICD-10-CM | POA: Diagnosis not present

## 2018-06-19 DIAGNOSIS — E1140 Type 2 diabetes mellitus with diabetic neuropathy, unspecified: Secondary | ICD-10-CM

## 2018-06-19 DIAGNOSIS — M79674 Pain in right toe(s): Secondary | ICD-10-CM | POA: Diagnosis not present

## 2018-06-19 NOTE — Progress Notes (Signed)
Patient ID: Megan [name], female   DOB: [birth year], 72 y.o.   MRN: [id number] Complaint:  Visit Type: Patient returns to my office for continued preventative foot care services. Complaint: Patient states" my nails have grown long and thick and become painful to walk and wear shoes" Patient has been diagnosed with DM . Patient says she has pain from callus under big toe joint right foot. Patient has lost her husband since last visit.  Podiatric Exam: Vascular: dorsalis pedis and posterior tibial pulses are not  palpable bilateral due to swelling both feet.. Capillary return is immediate. Temperature gradient is WNL. Skin turgor WNL  Sensorium: Normal Semmes Weinstein monofilament test. Normal tactile sensation bilaterally. Nail Exam: Pt has thick disfigured discolored nails with subungual debris noted bilateral entire nail hallux through fifth toenails.  Nail plate both hallux have  separated from nail bed region.  No redness or swelling noted. Ulcer Exam: There is no evidence of ulcer or pre-ulcerative changes or infection. Orthopedic Exam: Muscle tone and strength are WNL. No limitations in general ROM. No crepitus or effusions noted. Foot type and digits show no abnormalities. Bony prominences are unremarkable. Skin: No Porokeratosis. No infection or ulcers.  Porokeratosis right foot  Diagnosis:  Onychomycosis, , Pain in right toe, pain in left toes  Treatment & Plan Procedures and Treatment: Consent by patient was obtained for treatment procedures. The patient understood the discussion of treatment and procedures well. All questions were answered thoroughly reviewed. Debridement of mycotic and hypertrophic toenails, 1 through 5 bilateral and clearing of subungual debris. No ulceration, no infection noted. Debride porokeratosis  Right foot. Patient has severe unilateral left side swelling causing burning and pain left foot top. Return Visit-Office Procedure: Patient instructed to return to the  office for a follow up visit 10 weeks for continued evaluation and treatment.   Gardiner Barefoot DPM

## 2018-08-31 ENCOUNTER — Ambulatory Visit (INDEPENDENT_AMBULATORY_CARE_PROVIDER_SITE_OTHER): Payer: Medicare Other | Admitting: Podiatry

## 2018-08-31 ENCOUNTER — Telehealth: Payer: Self-pay | Admitting: Hematology

## 2018-08-31 ENCOUNTER — Encounter: Payer: Self-pay | Admitting: Podiatry

## 2018-08-31 DIAGNOSIS — E0842 Diabetes mellitus due to underlying condition with diabetic polyneuropathy: Secondary | ICD-10-CM

## 2018-08-31 DIAGNOSIS — I890 Lymphedema, not elsewhere classified: Secondary | ICD-10-CM | POA: Diagnosis not present

## 2018-08-31 DIAGNOSIS — M79675 Pain in left toe(s): Secondary | ICD-10-CM

## 2018-08-31 DIAGNOSIS — E1140 Type 2 diabetes mellitus with diabetic neuropathy, unspecified: Secondary | ICD-10-CM | POA: Diagnosis not present

## 2018-08-31 DIAGNOSIS — M79674 Pain in right toe(s): Secondary | ICD-10-CM

## 2018-08-31 DIAGNOSIS — B351 Tinea unguium: Secondary | ICD-10-CM | POA: Diagnosis not present

## 2018-08-31 NOTE — Progress Notes (Signed)
Patient ID: Megan [name], female   DOB: Jan 30, 1946, 73 y.o.   MRN: [id number] Complaint:  Visit Type: Patient returns to my office for continued preventative foot care services. Complaint: Patient states" my nails have grown long and thick and become painful to walk and wear shoes" Patient has been diagnosed with DM . Patient says she has pain from callus under big toe joint right foot. Patient has lost her husband since last visit.Patient says she has severe draining from both legs.  She says this weeping is severe.  Patient says she has been under care by PT for the last 2 weeks.  She is scheduled to have a nurse evaluate her today .  Podiatric Exam: Vascular: dorsalis pedis and posterior tibial pulses are not  palpable bilateral due to swelling both feet.. Capillary return is immediate. Temperature gradient is WNL. Skin turgor WNL  Sensorium: Normal Semmes Weinstein monofilament test. Normal tactile sensation bilaterally. Nail Exam: Pt has thick disfigured discolored nails with subungual debris noted bilateral entire nail hallux through fifth toenails.  Nail plate hallux  B/L has   separated from nail bed region.  No redness or swelling noted. Ulcer Exam: There is no evidence of ulcer or pre-ulcerative changes or infection. Orthopedic Exam: Muscle tone and strength are WNL. No limitations in general ROM. No crepitus or effusions noted. Foot type and digits show no abnormalities. Bony prominences are unremarkable. Skin: No Porokeratosis. No infection or ulcers.    Diagnosis:  Onychomycosis, , Pain in right toe, pain in left toes  Treatment & Plan Procedures and Treatment: Consent by patient was obtained for treatment procedures. The patient understood the discussion of treatment and procedures well. All questions were answered thoroughly reviewed. Debridement of mycotic and hypertrophic toenails, 1 through 5 bilateral and clearing of subungual debris. No ulceration, no infection noted.  Severe  lymphedema  Left greater than right leg. Return Visit-Office Procedure: Patient instructed to return to the office for a follow up visit 10 weeks for continued evaluation and treatment.   Gardiner Barefoot DPM

## 2018-08-31 NOTE — Telephone Encounter (Signed)
New referral received from Dr. [name] for normocytic anemia. A new hem appt has been scheduled for the pt to see Dr. Irene Limbo on 1/4 at 11am. Pt aware to arrive 15 minutes early.

## 2018-09-01 ENCOUNTER — Inpatient Hospital Stay: Payer: Medicare Other | Attending: Hematology | Admitting: Hematology

## 2018-09-01 VITALS — BP 116/44 | HR 66 | Temp 98.3°F | Resp 19 | Ht 62.0 in | Wt 269.1 lb

## 2018-09-01 DIAGNOSIS — E039 Hypothyroidism, unspecified: Secondary | ICD-10-CM

## 2018-09-01 DIAGNOSIS — F329 Major depressive disorder, single episode, unspecified: Secondary | ICD-10-CM | POA: Diagnosis not present

## 2018-09-01 DIAGNOSIS — K219 Gastro-esophageal reflux disease without esophagitis: Secondary | ICD-10-CM | POA: Diagnosis not present

## 2018-09-01 DIAGNOSIS — J45909 Unspecified asthma, uncomplicated: Secondary | ICD-10-CM | POA: Diagnosis not present

## 2018-09-01 DIAGNOSIS — M129 Arthropathy, unspecified: Secondary | ICD-10-CM | POA: Diagnosis not present

## 2018-09-01 DIAGNOSIS — Z79899 Other long term (current) drug therapy: Secondary | ICD-10-CM

## 2018-09-01 DIAGNOSIS — D509 Iron deficiency anemia, unspecified: Secondary | ICD-10-CM

## 2018-09-01 DIAGNOSIS — E785 Hyperlipidemia, unspecified: Secondary | ICD-10-CM | POA: Diagnosis not present

## 2018-09-01 DIAGNOSIS — D649 Anemia, unspecified: Secondary | ICD-10-CM | POA: Diagnosis present

## 2018-09-01 DIAGNOSIS — E669 Obesity, unspecified: Secondary | ICD-10-CM | POA: Insufficient documentation

## 2018-09-01 DIAGNOSIS — Z86010 Personal history of colonic polyps: Secondary | ICD-10-CM | POA: Diagnosis not present

## 2018-09-01 DIAGNOSIS — K5790 Diverticulosis of intestine, part unspecified, without perforation or abscess without bleeding: Secondary | ICD-10-CM | POA: Insufficient documentation

## 2018-09-01 DIAGNOSIS — E119 Type 2 diabetes mellitus without complications: Secondary | ICD-10-CM | POA: Insufficient documentation

## 2018-09-01 DIAGNOSIS — I10 Essential (primary) hypertension: Secondary | ICD-10-CM | POA: Insufficient documentation

## 2018-09-01 DIAGNOSIS — Z87891 Personal history of nicotine dependence: Secondary | ICD-10-CM | POA: Insufficient documentation

## 2018-09-01 DIAGNOSIS — E538 Deficiency of other specified B group vitamins: Secondary | ICD-10-CM

## 2018-09-01 DIAGNOSIS — M1990 Unspecified osteoarthritis, unspecified site: Secondary | ICD-10-CM | POA: Diagnosis not present

## 2018-09-01 MED ORDER — POLYSACCHARIDE IRON COMPLEX 150 MG PO CAPS: 150.0000 mg | ORAL_CAPSULE | Freq: Every day | ORAL | 2 refills | Status: DC

## 2018-09-01 NOTE — Progress Notes (Signed)
[name]    HEMATOLOGY/ONCOLOGY CONSULTATION NOTE  Date of Service: 09/01/2018  Patient Care Team: [name], MD as PCP - General (Family Medicine)  CHIEF COMPLAINTS/PURPOSE OF CONSULTATION:  Normocytic Anemia  HISTORY OF PRESENTING ILLNESS:  Megan [name] is a wonderful 73 y.o. female who has been referred to [name] by Dr .[name], MD  for evaluation and management of normocytic anemia.  Patient has a h/o inflammatory arthritis, hypothyroidism, iron deficiency, HTN, DM2, glaucoma, asthma . Patient had recent labs with her PCP on 08/13/2018 which showed WBC 7.9k, HCT 30.4, MCV 86, platelets of 349k. Nl FT4 levels. Ferritin 32.   MEDICAL HISTORY:  Past Medical History:  Diagnosis Date  . Allergy    bee stings  . Anxiety   . Asthmatic bronchitis   . Broken arm 1957/2008   right  . Cataract    had surgery   . Depression   . Diabetes mellitus without complication ([place])    prediabetes diet controlled  . Diverticulosis   . GERD (gastroesophageal reflux disease)   . Glaucoma    BIL  . Hx of adenomatous colonic polyps 12/02/2010  . Hyperlipidemia   . Hypertension   . Inflammatory arthritis   . Knee bursitis   . Migraines   . Obesity   . Osteoarthritis   . Sleep apnea     SURGICAL HISTORY: Past Surgical History:  Procedure Laterality Date  . CARPAL TUNNEL RELEASE Left    left wrist  . CATARACT EXTRACTION W/ INTRAOCULAR LENS  IMPLANT, BILATERAL Bilateral    1999 left, 2008 right  . COLONOSCOPY    . EXCISION MORTON'S NEUROMA Left 1978   left foot  . GLAUCOMA SURGERY Bilateral    and laser surgery, 2004 left, 2008 right  . PARTIAL HYSTERECTOMY  1991   Left an ovary  . POLYPECTOMY    . TOENAIL AVULSION Right    big toe    SOCIAL HISTORY: Social History   Socioeconomic History  . Marital status: Married    Spouse name: Lynnae Sandhoff  . Number of children: 0  . Years of education: Not on file  . Highest education level: Not on file  Occupational History    . Occupation: retired  Scientific laboratory technician  . Financial resource strain: Not on file  . Food insecurity:    Worry: Not on file    Inability: Not on file  . Transportation needs:    Medical: Not on file    Non-medical: Not on file  Tobacco Use  . Smoking status: Former Smoker    Types: Cigarettes    Last attempt to quit: 08/30/1971    Years since quitting: 47.0  . Smokeless tobacco: Never Used  Substance and Sexual Activity  . Alcohol use: No    Alcohol/week: 0.0 standard drinks  . Drug use: No  . Sexual activity: Not on file  Lifestyle  . Physical activity:    Days per week: Not on file    Minutes per session: Not on file  . Stress: Not on file  Relationships  . Social connections:    Talks on phone: Not on file    Gets together: Not on file    Attends religious service: Not on file    Active member of club or organization: Not on file    Attends meetings of clubs or organizations: Not on file    Relationship status: Not on file  . Intimate partner violence:    Fear of current or ex partner:  Not on file    Emotionally abused: Not on file    Physically abused: Not on file    Forced sexual activity: Not on file  Other Topics Concern  . Not on file  Social History Narrative   She is married no children retired.  Lives with husband in a one story home.  Retired from Geologist, engineering for [employer].    FAMILY HISTORY: Family History  Problem Relation Age of Onset  . Colon polyps Maternal Grandfather   . Bone cancer Maternal Grandfather   . Hypertension Mother   . Diabetes Mother   . Hypertension Father   . Diabetes Father   . Asthma Brother   . Obesity Other   . Diabetes Sister        x 2  . Diabetes Brother        prediabetes  . Rectal cancer Neg Hx   . Stomach cancer Neg Hx   . Esophageal cancer Neg Hx     ALLERGIES:  is allergic to aspirin; bee pollen; codeine; fish oil; lisinopril; sulfa antibiotics; sulfasalazine; vicodin [hydrocodone-acetaminophen]; camphor;  camphor; and penicillins.  MEDICATIONS:  Current Outpatient Medications  Medication Sig Dispense Refill  . (No Medication Selected) Apply 1 application topically. Carb/gaba/orph/pent/topi    . Acetaminophen (TYLENOL ARTHRITIS PAIN PO) Take by mouth.    [name] atenolol-chlorthalidone (TENORETIC) 50-25 MG per tablet Take 1 tablet by mouth daily.    [name] gabapentin (NEURONTIN) 600 MG tablet Take 1 tablet (600 mg total) by mouth 2 (two) times daily. (Patient taking differently: Take 300 mg by mouth 2 (two) times daily. ) 180 tablet 3  . LORazepam (ATIVAN) 0.5 MG tablet 0.5 mg.     . losartan (COZAAR) 100 MG tablet TAKE 1 TABLET ONCE A DAY ORALLY 90 DAYS  2  . meloxicam (MOBIC) 7.5 MG tablet Take 1 tablet (7.5 mg total) by mouth daily. (Patient taking differently: Take 15 mg by mouth daily. ) 30 tablet 0  . metFORMIN (GLUCOPHAGE) 500 MG tablet 1 TABLET WITH A MEAL TWICE A DAY ORALLY 30 DAY(S)  6  . Multiple Vitamins-Minerals (ICAPS AREDS FORMULA PO) Take by mouth daily.    . NONFORMULARY OR COMPOUNDED [place]  Anti-Inflammatory Cream- Diclofenac 3%, Baclofen 2%, Lidocaine 2% Apply 1-2 grams to affected area 3-4 times daily Qty. 120 gm 3 refills 120 each 3  . OLANZapine (ZYPREXA) 2.5 MG tablet     . Omeprazole-Sodium Bicarbonate (ZEGERID) 20-1100 MG CAPS capsule Take 1 capsule by mouth at bedtime. 28 each 0  . ONE TOUCH ULTRA TEST test strip     . pantoprazole (PROTONIX) 40 MG tablet TAKE 1 TABLET BY MOUTH  DAILY BEFORE BREAKFAST 90 tablet 0  . simvastatin (ZOCOR) 20 MG tablet Take 20 mg by mouth daily at 6 PM.      No current facility-administered medications for this visit.     REVIEW OF SYSTEMS:    10 Point review of Systems was done is negative except as noted above.  PHYSICAL EXAMINATION: ECOG PERFORMANCE STATUS: 2 - Symptomatic, <50% confined to bed  . Vitals:   09/01/18 1117  BP: (!) 116/44  Pulse: 66  Resp: 19  Temp: 98.3 F (36.8 C)  SpO2: 100%   Filed Weights    09/01/18 1117  Weight: 269 lb 1.6 oz (122.1 kg)   .Body mass index is 49.22 kg/m.  GENERAL:alert, in no acute distress and comfortable SKIN: no acute rashes, no significant lesions EYES: conjunctiva are pink and  non-injected, sclera anicteric OROPHARYNX: MMM, no exudates, no oropharyngeal erythema or ulceration NECK: supple, no JVD LYMPH:  no palpable lymphadenopathy in the cervical, axillary or inguinal regions LUNGS: clear to auscultation b/l with normal respiratory effort HEART: regular rate & rhythm ABDOMEN:  normoactive bowel sounds , non tender, not distended. No significant hepatosplenomegaly. Extremity: b/l lower extremities 3+ pedal edema with changes of chronic venous stasis.n some erythemia ? Stasis dermatitis vs early cellulitis. PSYCH: alert & oriented x 3 with fluent speech NEURO: no focal motor/sensory deficits  LABORATORY DATA:  I have reviewed the data as listed  . CBC Latest Ref Rng & Units 09/04/2018 09/04/2018 12/13/2014  WBC 4.0 - 10.5 K/uL 8.2 - 8.5  Hemoglobin 12.0 - 15.0 g/dL 9.4(L) - 11.3(L)  Hematocrit 34.0 - 46.6 % 29.2(L) 30.9(L) 34.1(L)  Platelets 150 - 400 K/uL 277 - 261   Component     Latest Ref Rng & Units 09/04/2018 09/04/2018        10:52 AM 10:53 AM  WBC     4.0 - 10.5 K/uL 8.2   RBC     3.87 - 5.11 MIL/uL 3.42 (L)   Hemoglobin     12.0 - 15.0 g/dL 9.4 (L)   HCT     34.0 - 46.6 % 30.9 (L) 29.2 (L)  MCV     80.0 - 100.0 fL 90.4   MCH     26.0 - 34.0 pg 27.5   MCHC     30.0 - 36.0 g/dL 30.4   RDW     11.5 - 15.5 % 14.6   Platelets     150 - 400 K/uL 277   nRBC     0.0 - 0.2 % 0.0   Neutrophils     % 72   NEUT#     1.7 - 7.7 K/uL 6.0   Lymphocytes     % 16   Lymphocyte #     0.7 - 4.0 K/uL 1.3   Monocytes Relative     % 8   Monocyte #     0.1 - 1.0 K/uL 0.7   Eosinophil     % 3   Eosinophils Absolute     0.0 - 0.5 K/uL 0.2   Basophil     % 1   Basophils Absolute     0.0 - 0.1 K/uL 0.1   Immature Granulocytes     % 0    Abs Immature Granulocytes     0.00 - 0.07 K/uL 0.03   Sodium     135 - 145 mmol/L 139   Potassium     3.5 - 5.1 mmol/L 4.7   Chloride     98 - 111 mmol/L 108   CO2     22 - 32 mmol/L 22   Glucose     70 - 99 mg/dL 137 (H)   BUN     8 - 23 mg/dL 45 (H)   Creatinine     0.44 - 1.00 mg/dL 1.66 (H)   Calcium     8.9 - 10.3 mg/dL 8.7 (L)   Total Protein     6.5 - 8.1 g/dL 6.4 (L)   Albumin     3.5 - 5.0 g/dL 2.9 (L)   AST     15 - 41 U/L 13 (L)   ALT     0 - 44 U/L 8   Alkaline Phosphatase     38 - 126 U/L 83   Total Bilirubin  0.3 - 1.2 mg/dL 0.3   GFR, Est Non African American     >60 mL/min 30 (L)   GFR, Est African American     >60 mL/min 35 (L)   Anion gap     5 - 15 9   IgG (Immunoglobin G), Serum     700 - 1,600 mg/dL 1,083   IgA     64 - 422 mg/dL 158   IgM (Immunoglobulin M), Srm     26 - 217 mg/dL 165   Total Protein ELP     6.0 - 8.5 g/dL 5.9 (L)   Albumin SerPl Elph-Mcnc     2.9 - 4.4 g/dL 3.0   Alpha 1     0.0 - 0.4 g/dL 0.2   Alpha2 Glob SerPl Elph-Mcnc     0.4 - 1.0 g/dL 1.0   B-Globulin SerPl Elph-Mcnc     0.7 - 1.3 g/dL 0.7   Gamma Glob SerPl Elph-Mcnc     0.4 - 1.8 g/dL 1.0   M Protein SerPl Elph-Mcnc     Not Observed g/dL Not Observed   Globulin, Total     2.2 - 3.9 g/dL 2.9   Albumin/Glob SerPl     0.7 - 1.7 1.1   IFE 1      Comment   Please Note (HCV):      Comment   Retic Ct Pct     0.4 - 3.1 % 1.4   RBC.     3.87 - 5.11 MIL/uL 3.42 (L)   Retic Count, Absolute     19.0 - 186.0 K/uL 49.2   Immature Retic Fract     2.3 - 15.9 % 21.2 (H)   Iron     41 - 142 ug/dL  40 (L)  TIBC     236 - 444 ug/dL  250  Saturation Ratios     21 - 57 %  16 (L)  UIBC     120 - 384 ug/dL  210  Folate, Hemolysate     Not Estab. ng/mL  450.0  Folate, RBC     >498 ng/mL  1,541  LDH     98 - 192 U/L 174   Vitamin B12     180 - 914 pg/mL 148 (L)   Ferritin     11 - 307 ng/mL  33  Sed Rate     0 - 22 mm/hr 66 (H)      RADIOGRAPHIC  STUDIES: I have personally reviewed the radiological images as listed and agreed with the findings in the report. No results found.  ASSESSMENT & PLAN:   54 with   1) Normocytic Anemia Likely multifactorial.   Plan -labs done to evaluate evaluate her normocytic anemia as per orders . Orders Placed This Encounter  Procedures  . CBC with Differential/Platelet    Standing Status:   Future    Number of Occurrences:   1    Standing Expiration Date:   10/06/2019  . CMP ([place] only)    Standing Status:   Future    Number of Occurrences:   1    Standing Expiration Date:   09/02/2019  . Sedimentation rate    Standing Status:   Future    Number of Occurrences:   1    Standing Expiration Date:   09/02/2019  . Ferritin    Standing Status:   Future    Number of Occurrences:   1    Standing Expiration Date:  09/02/2019  . Iron and TIBC    Standing Status:   Future    Number of Occurrences:   1    Standing Expiration Date:   09/01/2019  . Vitamin B12    Standing Status:   Future    Number of Occurrences:   1    Standing Expiration Date:   09/01/2019  . Folate RBC    Standing Status:   Future    Number of Occurrences:   1    Standing Expiration Date:   09/02/2019  . Multiple Myeloma Panel (SPEP&IFE w/QIG)    Standing Status:   Future    Number of Occurrences:   1    Standing Expiration Date:   09/02/2019  . Lactate dehydrogenase    Standing Status:   Future    Number of Occurrences:   1    Standing Expiration Date:   09/02/2019  . Reticulocytes    Standing Status:   Future    Number of Occurrences:   1    Standing Expiration Date:   09/02/2019   Primarily anemia of chronic disease from b/l leg swelling and chronic venous stasis dermatitis. - patient recommended to f/u with PCP to optimize mx of her pedal edema and venous stasis dermatitis and wound care considerations. Some iron deficiency anemia . Lab Results  Component Value Date   IRON 20 (L) 09/09/2018   TIBC 236 (L) 09/09/2018    IRONPCTSAT 8 (L) 09/09/2018   (Iron and TIBC)  Lab Results  Component Value Date   FERRITIN 28 09/09/2018   - would recommend IV iron to optimize ferritin to >100 - B12 deficiency 148 - start on SL B12 1000 mcg daily to optimize B12 levels >400. -Normal LDH - no evidence of hemolysis -myeloma panel - no evidence of M spike -hgb today 9.4 -- no overt indication for PRBC transfusion.  Labs on 09/04/2018 RTC with Dr Irene Limbo with labs in 3 months  All of the patients questions were answered with apparent satisfaction. The patient knows to call the clinic with any problems, questions or concerns.  I spent 45 minutes counseling the patient face to face. The total time spent in the appointment was 60 minutes and more than 50% was on counseling and direct patient cares.    Sullivan Lone MD [place] AAHIVMS [facility] Hematology/Oncology Physician [facility]  (Office):       [phone] (Work cell):  [phone] (Fax):           [phone]  09/01/2018 11:21 AM

## 2018-09-03 ENCOUNTER — Telehealth: Payer: Self-pay

## 2018-09-03 NOTE — Telephone Encounter (Signed)
Spoke with patient concerning upcoming appointment. Will pick up on 1/7 after labs. If not it will be mailed out that afternoon. Per 1/4 los

## 2018-09-04 ENCOUNTER — Inpatient Hospital Stay: Payer: Medicare Other

## 2018-09-04 DIAGNOSIS — D649 Anemia, unspecified: Secondary | ICD-10-CM

## 2018-09-04 LAB — CMP (CANCER CENTER ONLY)
ALT: 8 U/L (ref 0–44)
AST: 13 U/L — ABNORMAL LOW (ref 15–41)
Albumin: 2.9 g/dL — ABNORMAL LOW (ref 3.5–5.0)
Alkaline Phosphatase: 83 U/L (ref 38–126)
Anion gap: 9 (ref 5–15)
BUN: 45 mg/dL — ABNORMAL HIGH (ref 8–23)
CO2: 22 mmol/L (ref 22–32)
Calcium: 8.7 mg/dL — ABNORMAL LOW (ref 8.9–10.3)
Chloride: 108 mmol/L (ref 98–111)
Creatinine: 1.66 mg/dL — ABNORMAL HIGH (ref 0.44–1.00)
GFR, EST AFRICAN AMERICAN: 35 mL/min — AB (ref >60)
GFR, Estimated: 30 mL/min — ABNORMAL LOW (ref >60)
Glucose, Bld: 137 mg/dL — ABNORMAL HIGH (ref 70–99)
Potassium: 4.7 mmol/L (ref 3.5–5.1)
SODIUM: 139 mmol/L (ref 135–145)
Total Bilirubin: 0.3 mg/dL (ref 0.3–1.2)
Total Protein: 6.4 g/dL — ABNORMAL LOW (ref 6.5–8.1)

## 2018-09-04 LAB — RETICULOCYTES
Immature Retic Fract: 21.2 % — ABNORMAL HIGH (ref 2.3–15.9)
RBC.: 3.42 MIL/uL — ABNORMAL LOW (ref 3.87–5.11)
RETIC CT PCT: 1.4 % (ref 0.4–3.1)
Retic Count, Absolute: 49.2 10*3/uL (ref 19.0–186.0)

## 2018-09-04 LAB — CBC WITH DIFFERENTIAL/PLATELET
Abs Immature Granulocytes: 0.03 10*3/uL (ref 0.00–0.07)
BASOS ABS: 0.1 10*3/uL (ref 0.0–0.1)
Basophils Relative: 1 %
Eosinophils Absolute: 0.2 10*3/uL (ref 0.0–0.5)
Eosinophils Relative: 3 %
HCT: 30.9 % — ABNORMAL LOW (ref 36.0–46.0)
Hemoglobin: 9.4 g/dL — ABNORMAL LOW (ref 12.0–15.0)
Immature Granulocytes: 0 %
Lymphocytes Relative: 16 %
Lymphs Abs: 1.3 10*3/uL (ref 0.7–4.0)
MCH: 27.5 pg (ref 26.0–34.0)
MCHC: 30.4 g/dL (ref 30.0–36.0)
MCV: 90.4 fL (ref 80.0–100.0)
Monocytes Absolute: 0.7 10*3/uL (ref 0.1–1.0)
Monocytes Relative: 8 %
Neutro Abs: 6 10*3/uL (ref 1.7–7.7)
Neutrophils Relative %: 72 %
Platelets: 277 10*3/uL (ref 150–400)
RBC: 3.42 MIL/uL — ABNORMAL LOW (ref 3.87–5.11)
RDW: 14.6 % (ref 11.5–15.5)
WBC: 8.2 10*3/uL (ref 4.0–10.5)
nRBC: 0 % (ref 0.0–0.2)

## 2018-09-04 LAB — FERRITIN: Ferritin: 33 ng/mL (ref 11–307)

## 2018-09-04 LAB — IRON AND TIBC
Iron: 40 ug/dL — ABNORMAL LOW (ref 41–142)
Saturation Ratios: 16 % — ABNORMAL LOW (ref 21–57)
TIBC: 250 ug/dL (ref 236–444)
UIBC: 210 ug/dL (ref 120–384)

## 2018-09-04 LAB — LACTATE DEHYDROGENASE: LDH: 174 U/L (ref 98–192)

## 2018-09-04 LAB — VITAMIN B12: Vitamin B-12: 148 pg/mL — ABNORMAL LOW (ref 180–914)

## 2018-09-04 LAB — SEDIMENTATION RATE: SED RATE: 66 mm/h — AB (ref 0–22)

## 2018-09-05 ENCOUNTER — Telehealth: Payer: Self-pay | Admitting: *Deleted

## 2018-09-05 LAB — FOLATE RBC
Folate, Hemolysate: 450 ng/mL
Folate, RBC: 1541 ng/mL (ref >498)
Hematocrit: 29.2 % — ABNORMAL LOW (ref 34.0–46.6)

## 2018-09-05 MED ORDER — POLYSACCHARIDE IRON COMPLEX 150 MG PO CAPS: 150.0000 mg | ORAL_CAPSULE | Freq: Every day | ORAL | 2 refills | Status: DC

## 2018-09-05 NOTE — Telephone Encounter (Signed)
Patient left Voice Mail: Asked to have prescription for med sent earlier this week to [facility]  [facility] [address]. Also stated that if prescription costs over $600, she can't afford it. [facility]. Contacted patient to advise that [facility] for Niferex [facility]. Patient also states that if she needs lab work without an MD appt, she would like to have it drawn by home health nurse from [place] @ [phone]. Fax for orders is [phone] Patient stated her legs were sore after yesterday's appointment and that she wasn't sure she could walk that far at her next appt. Advised patient to ask for wheelchair at [facility] sign in desk next time. Patient verbalized understanding.

## 2018-09-06 LAB — MULTIPLE MYELOMA PANEL, SERUM
ALBUMIN SERPL ELPH-MCNC: 3 g/dL (ref 2.9–4.4)
ALPHA 1: 0.2 g/dL (ref 0.0–0.4)
Albumin/Glob SerPl: 1.1 (ref 0.7–1.7)
Alpha2 Glob SerPl Elph-Mcnc: 1 g/dL (ref 0.4–1.0)
B-Globulin SerPl Elph-Mcnc: 0.7 g/dL (ref 0.7–1.3)
Gamma Glob SerPl Elph-Mcnc: 1 g/dL (ref 0.4–1.8)
Globulin, Total: 2.9 g/dL (ref 2.2–3.9)
IgA: 158 mg/dL (ref 64–422)
IgG (Immunoglobin G), Serum: 1083 mg/dL (ref 700–1600)
IgM (Immunoglobulin M), Srm: 165 mg/dL (ref 26–217)
Total Protein ELP: 5.9 g/dL — ABNORMAL LOW (ref 6.0–8.5)

## 2018-09-08 ENCOUNTER — Encounter (HOSPITAL_COMMUNITY): Payer: Self-pay | Admitting: Emergency Medicine

## 2018-09-08 ENCOUNTER — Other Ambulatory Visit: Payer: Self-pay

## 2018-09-08 ENCOUNTER — Emergency Department (HOSPITAL_COMMUNITY): Payer: Medicare Other

## 2018-09-08 ENCOUNTER — Inpatient Hospital Stay (HOSPITAL_COMMUNITY)
Admission: EM | Admit: 2018-09-08 | Discharge: 2018-09-18 | DRG: 291 | Disposition: A | Discharge status: Skilled Nursing Facility | Payer: Medicare Other | Attending: Family Medicine | Admitting: Family Medicine

## 2018-09-08 DIAGNOSIS — Z9119 Patient's noncompliance with other medical treatment and regimen: Secondary | ICD-10-CM

## 2018-09-08 DIAGNOSIS — I5030 Unspecified diastolic (congestive) heart failure: Secondary | ICD-10-CM | POA: Diagnosis present

## 2018-09-08 DIAGNOSIS — Z885 Allergy status to narcotic agent status: Secondary | ICD-10-CM

## 2018-09-08 DIAGNOSIS — I130 Hypertensive heart and chronic kidney disease with heart failure and stage 1 through stage 4 chronic kidney disease, or unspecified chronic kidney disease: Principal | ICD-10-CM | POA: Diagnosis present

## 2018-09-08 DIAGNOSIS — E0842 Diabetes mellitus due to underlying condition with diabetic polyneuropathy: Secondary | ICD-10-CM

## 2018-09-08 DIAGNOSIS — Z7984 Long term (current) use of oral hypoglycemic drugs: Secondary | ICD-10-CM

## 2018-09-08 DIAGNOSIS — Z9841 Cataract extraction status, right eye: Secondary | ICD-10-CM

## 2018-09-08 DIAGNOSIS — I110 Hypertensive heart disease with heart failure: Secondary | ICD-10-CM | POA: Diagnosis present

## 2018-09-08 DIAGNOSIS — K219 Gastro-esophageal reflux disease without esophagitis: Secondary | ICD-10-CM | POA: Diagnosis present

## 2018-09-08 DIAGNOSIS — K921 Melena: Secondary | ICD-10-CM | POA: Diagnosis not present

## 2018-09-08 DIAGNOSIS — Z791 Long term (current) use of non-steroidal anti-inflammatories (NSAID): Secondary | ICD-10-CM

## 2018-09-08 DIAGNOSIS — E1142 Type 2 diabetes mellitus with diabetic polyneuropathy: Secondary | ICD-10-CM | POA: Diagnosis present

## 2018-09-08 DIAGNOSIS — Z888 Allergy status to other drugs, medicaments and biological substances status: Secondary | ICD-10-CM

## 2018-09-08 DIAGNOSIS — H905 Unspecified sensorineural hearing loss: Secondary | ICD-10-CM | POA: Diagnosis present

## 2018-09-08 DIAGNOSIS — E118 Type 2 diabetes mellitus with unspecified complications: Secondary | ICD-10-CM

## 2018-09-08 DIAGNOSIS — Z886 Allergy status to analgesic agent status: Secondary | ICD-10-CM

## 2018-09-08 DIAGNOSIS — M7989 Other specified soft tissue disorders: Secondary | ICD-10-CM | POA: Diagnosis not present

## 2018-09-08 DIAGNOSIS — G4733 Obstructive sleep apnea (adult) (pediatric): Secondary | ICD-10-CM | POA: Diagnosis present

## 2018-09-08 DIAGNOSIS — I10 Essential (primary) hypertension: Secondary | ICD-10-CM

## 2018-09-08 DIAGNOSIS — Z90711 Acquired absence of uterus with remaining cervical stump: Secondary | ICD-10-CM

## 2018-09-08 DIAGNOSIS — N189 Chronic kidney disease, unspecified: Secondary | ICD-10-CM | POA: Diagnosis present

## 2018-09-08 DIAGNOSIS — E785 Hyperlipidemia, unspecified: Secondary | ICD-10-CM | POA: Diagnosis present

## 2018-09-08 DIAGNOSIS — E1122 Type 2 diabetes mellitus with diabetic chronic kidney disease: Secondary | ICD-10-CM | POA: Diagnosis present

## 2018-09-08 DIAGNOSIS — E538 Deficiency of other specified B group vitamins: Secondary | ICD-10-CM | POA: Diagnosis present

## 2018-09-08 DIAGNOSIS — I471 Supraventricular tachycardia: Secondary | ICD-10-CM | POA: Diagnosis not present

## 2018-09-08 DIAGNOSIS — Z6841 Body Mass Index (BMI) 40.0 and over, adult: Secondary | ICD-10-CM

## 2018-09-08 DIAGNOSIS — K649 Unspecified hemorrhoids: Secondary | ICD-10-CM | POA: Diagnosis present

## 2018-09-08 DIAGNOSIS — I5031 Acute diastolic (congestive) heart failure: Secondary | ICD-10-CM | POA: Diagnosis not present

## 2018-09-08 DIAGNOSIS — I872 Venous insufficiency (chronic) (peripheral): Secondary | ICD-10-CM | POA: Diagnosis present

## 2018-09-08 DIAGNOSIS — Z8371 Family history of colonic polyps: Secondary | ICD-10-CM

## 2018-09-08 DIAGNOSIS — E039 Hypothyroidism, unspecified: Secondary | ICD-10-CM | POA: Diagnosis present

## 2018-09-08 DIAGNOSIS — L03119 Cellulitis of unspecified part of limb: Secondary | ICD-10-CM

## 2018-09-08 DIAGNOSIS — Z91048 Other nonmedicinal substance allergy status: Secondary | ICD-10-CM

## 2018-09-08 DIAGNOSIS — Z87891 Personal history of nicotine dependence: Secondary | ICD-10-CM

## 2018-09-08 DIAGNOSIS — Z833 Family history of diabetes mellitus: Secondary | ICD-10-CM

## 2018-09-08 DIAGNOSIS — Z9842 Cataract extraction status, left eye: Secondary | ICD-10-CM

## 2018-09-08 DIAGNOSIS — R600 Localized edema: Secondary | ICD-10-CM

## 2018-09-08 DIAGNOSIS — I313 Pericardial effusion (noninflammatory): Secondary | ICD-10-CM | POA: Diagnosis present

## 2018-09-08 DIAGNOSIS — Z961 Presence of intraocular lens: Secondary | ICD-10-CM | POA: Diagnosis present

## 2018-09-08 DIAGNOSIS — Z79899 Other long term (current) drug therapy: Secondary | ICD-10-CM

## 2018-09-08 DIAGNOSIS — Z86010 Personal history of colonic polyps: Secondary | ICD-10-CM

## 2018-09-08 DIAGNOSIS — Z91030 Bee allergy status: Secondary | ICD-10-CM

## 2018-09-08 DIAGNOSIS — Z882 Allergy status to sulfonamides status: Secondary | ICD-10-CM

## 2018-09-08 DIAGNOSIS — Z880 Allergy status to penicillin: Secondary | ICD-10-CM

## 2018-09-08 DIAGNOSIS — I5033 Acute on chronic diastolic (congestive) heart failure: Secondary | ICD-10-CM | POA: Diagnosis present

## 2018-09-08 DIAGNOSIS — I5032 Chronic diastolic (congestive) heart failure: Secondary | ICD-10-CM | POA: Diagnosis present

## 2018-09-08 DIAGNOSIS — E876 Hypokalemia: Secondary | ICD-10-CM | POA: Diagnosis present

## 2018-09-08 DIAGNOSIS — N179 Acute kidney failure, unspecified: Secondary | ICD-10-CM | POA: Diagnosis present

## 2018-09-08 DIAGNOSIS — Z8249 Family history of ischemic heart disease and other diseases of the circulatory system: Secondary | ICD-10-CM

## 2018-09-08 LAB — COMPREHENSIVE METABOLIC PANEL
ALT: 10 U/L (ref 0–44)
AST: 15 U/L (ref 15–41)
Albumin: 3.2 g/dL — ABNORMAL LOW (ref 3.5–5.0)
Alkaline Phosphatase: 77 U/L (ref 38–126)
Anion gap: 8 (ref 5–15)
BUN: 54 mg/dL — ABNORMAL HIGH (ref 8–23)
CO2: 22 mmol/L (ref 22–32)
Calcium: 8.5 mg/dL — ABNORMAL LOW (ref 8.9–10.3)
Chloride: 110 mmol/L (ref 98–111)
Creatinine, Ser: 1.29 mg/dL — ABNORMAL HIGH (ref 0.44–1.00)
GFR calc Af Amer: 48 mL/min — ABNORMAL LOW (ref >60)
GFR calc non Af Amer: 41 mL/min — ABNORMAL LOW (ref >60)
GLUCOSE: 135 mg/dL — AB (ref 70–99)
Potassium: 4.3 mmol/L (ref 3.5–5.1)
Sodium: 140 mmol/L (ref 135–145)
Total Bilirubin: 0.8 mg/dL (ref 0.3–1.2)
Total Protein: 6.4 g/dL — ABNORMAL LOW (ref 6.5–8.1)

## 2018-09-08 LAB — CBC WITH DIFFERENTIAL/PLATELET
Abs Immature Granulocytes: 0.04 10*3/uL (ref 0.00–0.07)
Basophils Absolute: 0.1 10*3/uL (ref 0.0–0.1)
Basophils Relative: 1 %
Eosinophils Absolute: 0.2 10*3/uL (ref 0.0–0.5)
Eosinophils Relative: 2 %
HCT: 30.9 % — ABNORMAL LOW (ref 36.0–46.0)
Hemoglobin: 9.1 g/dL — ABNORMAL LOW (ref 12.0–15.0)
Immature Granulocytes: 1 %
Lymphocytes Relative: 14 %
Lymphs Abs: 1.2 10*3/uL (ref 0.7–4.0)
MCH: 27.7 pg (ref 26.0–34.0)
MCHC: 29.4 g/dL — ABNORMAL LOW (ref 30.0–36.0)
MCV: 93.9 fL (ref 80.0–100.0)
MONOS PCT: 8 %
Monocytes Absolute: 0.7 10*3/uL (ref 0.1–1.0)
Neutro Abs: 6.5 10*3/uL (ref 1.7–7.7)
Neutrophils Relative %: 74 %
Platelets: 261 10*3/uL (ref 150–400)
RBC: 3.29 MIL/uL — ABNORMAL LOW (ref 3.87–5.11)
RDW: 14.5 % (ref 11.5–15.5)
WBC: 8.7 10*3/uL (ref 4.0–10.5)
nRBC: 0 % (ref 0.0–0.2)

## 2018-09-08 LAB — GLUCOSE, CAPILLARY: Glucose-Capillary: 129 mg/dL — ABNORMAL HIGH (ref 70–99)

## 2018-09-08 LAB — TROPONIN I: Troponin I: <0.03 ng/mL (ref <0.03)

## 2018-09-08 MED ORDER — FUROSEMIDE 10 MG/ML IJ SOLN
40.0000 mg | Freq: Two times a day (BID) | INTRAMUSCULAR | Status: DC
  Administered 2018-09-09: 40 mg via INTRAVENOUS
  Filled 2018-09-08: qty 4

## 2018-09-08 MED ORDER — ONDANSETRON HCL 4 MG/2ML IJ SOLN
4.0000 mg | Freq: Four times a day (QID) | INTRAMUSCULAR | Status: DC | PRN
  Administered 2018-09-11 – 2018-09-13 (×2): 4 mg via INTRAVENOUS
  Filled 2018-09-08 (×2): qty 2

## 2018-09-08 MED ORDER — INSULIN ASPART 100 UNIT/ML ~~LOC~~ SOLN
0.0000 [IU] | Freq: Every day | SUBCUTANEOUS | Status: DC
  Administered 2018-09-12: 2 [IU] via SUBCUTANEOUS

## 2018-09-08 MED ORDER — SIMVASTATIN 20 MG PO TABS
20.0000 mg | ORAL_TABLET | Freq: Every day | ORAL | Status: DC
  Administered 2018-09-09 – 2018-09-17 (×8): 20 mg via ORAL
  Filled 2018-09-08 (×8): qty 1

## 2018-09-08 MED ORDER — LEVOTHYROXINE SODIUM 50 MCG PO TABS
50.0000 ug | ORAL_TABLET | Freq: Every day | ORAL | Status: DC
  Administered 2018-09-09 – 2018-09-18 (×10): 50 ug via ORAL
  Filled 2018-09-08 (×11): qty 1

## 2018-09-08 MED ORDER — INSULIN ASPART 100 UNIT/ML ~~LOC~~ SOLN
0.0000 [IU] | Freq: Three times a day (TID) | SUBCUTANEOUS | Status: DC
  Administered 2018-09-09 – 2018-09-10 (×5): 1 [IU] via SUBCUTANEOUS
  Administered 2018-09-10 – 2018-09-11 (×2): 2 [IU] via SUBCUTANEOUS
  Administered 2018-09-11: 1 [IU] via SUBCUTANEOUS
  Administered 2018-09-11 – 2018-09-13 (×5): 2 [IU] via SUBCUTANEOUS
  Administered 2018-09-13: 3 [IU] via SUBCUTANEOUS
  Administered 2018-09-13 – 2018-09-15 (×6): 2 [IU] via SUBCUTANEOUS
  Administered 2018-09-15: 3 [IU] via SUBCUTANEOUS
  Administered 2018-09-16 (×2): 2 [IU] via SUBCUTANEOUS
  Administered 2018-09-16: 3 [IU] via SUBCUTANEOUS
  Administered 2018-09-17 – 2018-09-18 (×5): 2 [IU] via SUBCUTANEOUS

## 2018-09-08 MED ORDER — SODIUM CHLORIDE 0.9 % IV SOLN: 250.0000 mL | INTRAVENOUS | Status: DC | PRN

## 2018-09-08 MED ORDER — ONDANSETRON HCL 4 MG PO TABS
4.0000 mg | ORAL_TABLET | Freq: Four times a day (QID) | ORAL | Status: DC | PRN
  Administered 2018-09-08 – 2018-09-17 (×2): 4 mg via ORAL
  Filled 2018-09-08 (×2): qty 1

## 2018-09-08 MED ORDER — SODIUM CHLORIDE 0.9% FLUSH: 3.0000 mL | INTRAVENOUS | Status: DC | PRN

## 2018-09-08 MED ORDER — ACETAMINOPHEN 650 MG RE SUPP: 650.0000 mg | Freq: Four times a day (QID) | RECTAL | Status: DC | PRN

## 2018-09-08 MED ORDER — SODIUM CHLORIDE 0.9% FLUSH
3.0000 mL | Freq: Two times a day (BID) | INTRAVENOUS | Status: DC
  Administered 2018-09-08 – 2018-09-18 (×17): 3 mL via INTRAVENOUS

## 2018-09-08 MED ORDER — PANTOPRAZOLE SODIUM 40 MG PO TBEC
40.0000 mg | DELAYED_RELEASE_TABLET | Freq: Every day | ORAL | Status: DC
  Administered 2018-09-08 – 2018-09-18 (×11): 40 mg via ORAL
  Filled 2018-09-08 (×11): qty 1

## 2018-09-08 MED ORDER — ACETAMINOPHEN 325 MG PO TABS
650.0000 mg | ORAL_TABLET | Freq: Four times a day (QID) | ORAL | Status: DC | PRN
  Administered 2018-09-08 – 2018-09-18 (×14): 650 mg via ORAL
  Filled 2018-09-08 (×16): qty 2

## 2018-09-08 NOTE — ED Provider Notes (Signed)
[place] DEPT Provider Note   CSN: [id number] Arrival date & time: 09/08/18  1429     History   Chief Complaint Chief Complaint  Patient presents with  . Leg Swelling    HPI Megan [name] is a 73 y.o. female.  73 y/o female with a PMH of HTN, HLD, DM presents to the ED with a chief complaint of bilateral leg swelling x November. Patient has been seen by her Podiatrist who recommended she get antibiotics prescribed by her PCP Dr. Serita Grammes, she was prescribed keflex 500 mg b.i.d which she will complete on Tuesday 07/12/2018. She reports compliances with her medication along with having a home health nursing changing the bandages for the past week, she reports wrapping 4 times. She reports waking up this morning and having an increase in drainage, she reports she felt like her bed was soaked.  Patient reports she has been walking less as she is unable to walk due to the swelling in her legs and has now been using a walker.  She also endorses some shortness of breath which is worse with ambulation.  She denies any chest pain, fevers, chills or other complaints at this time.     Past Medical History:  Diagnosis Date  . Allergy    bee stings  . Anxiety   . Asthmatic bronchitis   . Broken arm 1957/2008   right  . Cataract    had surgery   . Depression   . Diabetes mellitus without complication ([place])    prediabetes diet controlled  . Diverticulosis   . GERD (gastroesophageal reflux disease)   . Glaucoma    BIL  . Hx of adenomatous colonic polyps 12/02/2010  . Hyperlipidemia   . Hypertension   . Inflammatory arthritis   . Knee bursitis   . Migraines   . Obesity   . Osteoarthritis   . Sleep apnea     Patient Active Problem List   Diagnosis Date Noted  . Sensorineural hearing loss (SNHL) of both ears 07/31/2017  . Lumbar radiculopathy 12/05/2016  . Diabetic polyneuropathy associated with diabetes mellitus due to underlying  condition ([place]) 07/09/2015  . Vitamin deficiency related neuropathy 07/09/2015  . Hx of adenomatous colonic polyps 12/02/2010    Past Surgical History:  Procedure Laterality Date  . CARPAL TUNNEL RELEASE Left    left wrist  . CATARACT EXTRACTION W/ INTRAOCULAR LENS  IMPLANT, BILATERAL Bilateral    1999 left, 2008 right  . COLONOSCOPY    . EXCISION MORTON'S NEUROMA Left 1978   left foot  . GLAUCOMA SURGERY Bilateral    and laser surgery, 2004 left, 2008 right  . PARTIAL HYSTERECTOMY  1991   Left an ovary  . POLYPECTOMY    . TOENAIL AVULSION Right    big toe     OB History   No obstetric history on file.      Home Medications    Prior to Admission medications   Medication Sig Start Date End Date Taking? Authorizing Provider  (No Medication Selected) Apply 1 application topically. Carb/gaba/orph/pent/topi    [provider]  Acetaminophen (TYLENOL ARTHRITIS PAIN PO) Take by mouth.    [provider]  atenolol-chlorthalidone (TENORETIC) 50-25 MG per tablet Take 1 tablet by mouth daily.    [provider]  gabapentin (NEURONTIN) 600 MG tablet Take 1 tablet (600 mg total) by mouth 2 (two) times daily. Patient taking differently: Take 300 mg by mouth 2 (two) times daily.  07/11/16   Narda Amber K, DO  iron polysaccharides (NIFEREX) 150 MG capsule Take 1 capsule (150 mg total) by mouth daily. 09/05/18   Brunetta Genera, MD  LORazepam (ATIVAN) 0.5 MG tablet 0.5 mg.  11/25/15   [provider]  losartan (COZAAR) 100 MG tablet Take 100 mg by mouth daily.  02/24/15   [provider]  meloxicam (MOBIC) 7.5 MG tablet Take 1 tablet (7.5 mg total) by mouth daily. Patient taking differently: Take 15 mg by mouth daily.  05/30/17   Gardiner Barefoot, DPM  metFORMIN (GLUCOPHAGE) 500 MG tablet Take 500 mg by mouth 2 (two) times daily with a meal.  01/22/18   [provider]  Multiple Vitamins-Minerals (ICAPS AREDS FORMULA PO) Take by mouth  daily.    [provider]  NONFORMULARY OR COMPOUNDED [place]  Anti-Inflammatory Cream- Diclofenac 3%, Baclofen 2%, Lidocaine 2% Apply 1-2 grams to affected area 3-4 times daily Qty. 120 gm 3 refills 05/30/17   Gardiner Barefoot, DPM  OLANZapine [facility]) 2.5 MG tablet  05/24/18   [provider]  Omeprazole-Sodium Bicarbonate (ZEGERID) 20-1100 MG CAPS capsule Take 1 capsule by mouth at bedtime. 12/12/14   Gatha Mayer, MD  ONE TOUCH ULTRA TEST test strip  04/17/18   [provider]  pantoprazole (PROTONIX) 40 MG tablet TAKE 1 TABLET BY MOUTH  DAILY BEFORE BREAKFAST Patient taking differently: Take 40 mg by mouth daily.  08/30/16   Gatha Mayer, MD  simvastatin (ZOCOR) 20 MG tablet Take 20 mg by mouth daily at 6 PM.  06/15/16   [provider]    Family History Family History  Problem Relation Age of Onset  . Colon polyps Maternal Grandfather   . Bone cancer Maternal Grandfather   . Hypertension Mother   . Diabetes Mother   . Hypertension Father   . Diabetes Father   . Asthma Brother   . Obesity Other   . Diabetes Sister        x 2  . Diabetes Brother        prediabetes  . Rectal cancer Neg Hx   . Stomach cancer Neg Hx   . Esophageal cancer Neg Hx     Social History Social History   Tobacco Use  . Smoking status: Former Smoker    Types: Cigarettes    Last attempt to quit: 08/30/1971    Years since quitting: 47.0  . Smokeless tobacco: Never Used  Substance Use Topics  . Alcohol use: No    Alcohol/week: 0.0 standard drinks  . Drug use: No     Allergies   Aspirin; Bee pollen; Codeine; Fish oil; Lisinopril; Sulfa antibiotics; Sulfasalazine; Vicodin [hydrocodone-acetaminophen]; Camphor; Camphor; and Penicillins   Review of Systems Review of Systems  Constitutional: Negative for fever.  Gastrointestinal: Negative for abdominal pain, nausea and vomiting.  Musculoskeletal: Positive for gait problem. Negative for  myalgias.  Skin: Positive for color change and wound.  All other systems reviewed and are negative.    Physical Exam Updated Vital Signs BP (!) 147/48   Pulse 74   Temp 97.8 F (36.6 C) (Oral)   Resp 16   Ht 5\' 2"  (1.575 m)   Wt 122 kg   SpO2 98%   BMI 49.20 kg/m   Physical Exam Vitals signs and nursing note reviewed.  Constitutional:      General: She is not in acute distress.    Appearance: She is well-developed.  HENT:  Head: Normocephalic and atraumatic.     Mouth/Throat:     Pharynx: No oropharyngeal exudate.  Eyes:     Pupils: Pupils are equal, round, and reactive to light.  Neck:     Musculoskeletal: Normal range of motion.  Cardiovascular:     Rate and Rhythm: Regular rhythm.     Heart sounds: Normal heart sounds.  Pulmonary:     Effort: Pulmonary effort is normal. No respiratory distress.     Breath sounds: Normal breath sounds.  Abdominal:     General: Bowel sounds are normal. There is no distension.     Palpations: Abdomen is soft.     Tenderness: There is no abdominal tenderness.  Musculoskeletal:        General: No tenderness or deformity.     Right lower leg: No edema.     Left lower leg: No edema.  Skin:    General: Skin is warm and dry.     Findings: Erythema present.     Comments: Bilateral leg weeping of lower extremities, some right toe discoloration.   Neurological:     Mental Status: She is alert and oriented to person, place, and time.          ED Treatments / Results  Labs (all labs ordered are listed, but only abnormal results are displayed) Labs Reviewed  COMPREHENSIVE METABOLIC PANEL - Abnormal; Notable for the following components:      Result Value   Glucose, Bld 135 (*)    BUN 54 (*)    Creatinine, Ser 1.29 (*)    Calcium 8.5 (*)    Total Protein 6.4 (*)    Albumin 3.2 (*)    GFR calc non Af Amer 41 (*)    GFR calc Af Amer 48 (*)    All other components within normal limits  CBC WITH DIFFERENTIAL/PLATELET -  Abnormal; Notable for the following components:   RBC 3.29 (*)    Hemoglobin 9.1 (*)    HCT 30.9 (*)    MCHC 29.4 (*)    All other components within normal limits  CBC WITH DIFFERENTIAL/PLATELET  BRAIN NATRIURETIC PEPTIDE    EKG None  Radiology Dg Chest 2 View  Result Date: 09/08/2018 CLINICAL DATA:  Shortness of breath. Lower extremity swelling. EXAM: CHEST - 2 VIEW COMPARISON:  07/09/2014 FINDINGS: Low lung volumes are noted. Heart size is within normal limits allowing for low lung volumes. No evidence of pulmonary infiltrate or edema. No evidence of pleural effusion. A moderate size hiatal hernia is noted. IMPRESSION: 1. Low lung volumes. No active lung disease. 2. Moderate hiatal hernia. Electronically Signed   By: Earle Gell M.D.   On: 09/08/2018 16:49    Procedures Procedures (including critical care time)  Medications Ordered in ED Medications - No data to display   Initial Impression / Assessment and Plan / ED Course  I have reviewed the triage vital signs and the nursing notes.  Pertinent labs & imaging results that were available during my care of the patient were reviewed by me and considered in my medical decision making (see chart for details).    Patient presents with worsening weeping, drainage from BLE since a week ago.Patient reports this problem began multiple days ago and she thinks her symptoms are worsening.  She is currently taking Keflex and will have continued therapy on Tuesday.  CMP showed levator creatinine at 1.29 which is improved from previous visit 4 days ago of 1.6.  CBC showed no leukocytosis,  patient is currently on Keflex, hemoglobin slightly decreased but consistent with patient's previous visits she does have a history of anemia and is currently on an iron supplement.  Chest 2 view showed: 1. Low lung volumes. No active lung disease.  2. Moderate hiatal hernia.   Reports she has been under a lot of stress lately, her husband passed away in  24-Feb-2023 and she has been ambulating less, she currently ambulates with a walker around her home, sister at the bedside reports patient lives alone. I have discussed this patient with Dr. Winfred Leeds who has seen and evaluated patient. At this time place call for hospitalist admission due to failure to Keflex for cellulitis treatment.  6:53 PM Spoke to Dr. Roel Cluck who advised adding BNP, she will admit patient   Final Clinical Impressions(s) / ED Diagnoses   Final diagnoses:  Cellulitis of lower extremity, unspecified laterality    ED Discharge Orders    None       Janeece Fitting, PA-C 09/08/18 1856    Orlie Dakin, MD 09/09/18 0004

## 2018-09-08 NOTE — ED Notes (Signed)
Called to give report, nurse unavailable at this time. 

## 2018-09-08 NOTE — Progress Notes (Signed)
Pt has declined the use of hospital provided CPAP, pt states that she has not used hers at home in years.  RT to monitor and assess as needed.

## 2018-09-08 NOTE — H&P (Signed)
Megan [name] NWG:[id number] DOB: [birth year] DOA: 09/08/2018     PCP: [name], MD   Outpatient Specialists:  CARDS:  She is unsure about the name appointment is on 09/20/2018    Oncology  Dr. Irene Limbo GI  Dr.  Chryl Heck) Carlean Purl    Patient arrived to ER on 09/08/18 at 1429  Patient coming from: home Lives alone,       Chief Complaint:  Chief Complaint  Patient presents with  . Leg Swelling    HPI: Megan [name] is a 73 y.o. female with medical history significant of DM, HTN, diastolic CHF, OSA, hypothyroidism    Presented with    bilateral leg swelling and pain since November 2019, skin started to break down over past 1 week with weeping, both legs got red.  Legs started to sting. Her PCP has attempted to use Keflex and has treated it for 5 days but she still continues to have same swelling or redness.  Her home health nurse saw her and advised her to come to urgent care Patient have not had any fevers no shortness of breath or chest pain. Her primary care provider has attempted to adjust her diuretics without significant improvement Been able to walk secondary to pain and has been sitting in recliner her legs hanging down majority of time. Have had some dyspnea on exertion She has been followed by podiatry in the past. She has known history of lymphedema She has not been eating well   She had a una boot on last week and it got soaked through. She reports significant weight gain 8 lb in the past 3 days  She was on Lasix but it was not working so ws switched to Bumex and have been titrated up to 3 mg in AM, She states when she takes the Bumex she does make urine but not thorught out the day.  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  She started to have worsening SOB since a week before Chrismas.  She used to be able to walk with a cane now need walker.   Reports she had severe  hemorrhoids with blood in stool \\with  occasional blood in stool She have had pink water in the bowl  Last colonoscopy was in October by Dr. Carlean Purl and was told she has a polip that was removed.  Reports occasional pain with defecation. No recent hard stools.  She did have some diarrhea.    Regarding pertinent Chronic problems:   Story of anemia followed by Dr. Irene Limbo  Last ECHO in 2016 showed EF 57-84% grade 1 diastolic   dysfunction  While in ER: Blood cell count within normal limits chronically anemic The following Work up has been ordered so far:  Orders Placed This Encounter  Procedures  . DG Chest 2 View  . CBC with Differential  . Comprehensive metabolic panel  . CBC with Differential/Platelet  . Brain natriuretic peptide  . Consult to hospitalist  [phone]  . ED EKG  . EKG 12-Lead  . Insert peripheral IV     Following Medications were ordered in ER: Medications - No data to display  Significant initial  Findings: Abnormal Labs Reviewed  COMPREHENSIVE METABOLIC PANEL - Abnormal; Notable for the following components:      Result Value   Glucose, Bld 135 (*)    BUN 54 (*)  Creatinine, Ser 1.29 (*)    Calcium 8.5 (*)    Total Protein 6.4 (*)    Albumin 3.2 (*)    GFR calc non Af Amer 41 (*)    GFR calc Af Amer 48 (*)    All other components within normal limits  CBC WITH DIFFERENTIAL/PLATELET - Abnormal; Notable for the following components:   RBC 3.29 (*)    Hemoglobin 9.1 (*)    HCT 30.9 (*)    MCHC 29.4 (*)    All other components within normal limits     Lactic Acid, Venous No results found for: LATICACIDVEN  Na  140 K 4.3  Cr   stable,    Lab Results  Component Value Date   CREATININE 1.29 (H) 09/08/2018   CREATININE 1.66 (H) 09/04/2018   CREATININE 0.92 12/13/2014    ALB 3.2   WBC  8.7  HG/HCT slightly down       Component Value Date/Time   HGB 9.1 (L) 09/08/2018 1720   HCT 30.9 (L) 09/08/2018 1720   HCT 29.2 (L) 09/04/2018 1053      Troponin (Point of Care Test) No results for input(s): TROPIPOC in the last 72 hours.    BNP (last 3 results) No results for input(s): BNP in the last 8760 hours.  ProBNP (last 3 results) No results for input(s): PROBNP in the last 8760 hours.     UA  ordered     CXR - NON acute    ECG:  Personally reviewed by me showing: HR : 64 Rhythm:  NSR no evidence of ischemic changes QTC 436     ED Triage Vitals [09/08/18 1436]  Enc Vitals Group     BP (!) 119/43     Pulse Rate 68     Resp 19     Temp 97.8 F (36.6 C)     Temp Source Oral     SpO2 100 %     Weight 269 lb (122 kg)     Height 5\' 2"  (1.575 m)     Head Circumference      Peak Flow      Pain Score      Pain Loc      Pain Edu?      Excl. in [place]?   IRCV(89)@       Latest  Blood pressure (!) 147/48, pulse 74, temperature 97.8 F (36.6 C), temperature source Oral, resp. rate 16, height 5\' 2"  (1.575 m), weight 122 kg, SpO2 98 %.     Hospitalist was called for admission for bilateral lymphedema   Review of Systems:    Pertinent positives include: fatigue, weight  Gain, blood in stool  Diarrhea,  Bilateral lower extremity swelling  dyspnea on exertion  Constitutional:  No weight loss, night sweats, Fevers, chills,  HEENT:  No headaches, Difficulty swallowing,Tooth/dental problems,Sore throat,  No sneezing, itching, ear ache, nasal congestion, post nasal drip,  Cardio-vascular:  No chest pain, Orthopnea, PND, anasarca, dizziness, palpitations.noGI:  No heartburn, indigestion, abdominal pain, nausea, vomiting,  change in bowel habits, loss of appetite, melena,   hematemesis Resp:    No excess mucus, no productive cough, No non-productive cough, No coughing up of blood.No change in color of mucus.No wheezing. Skin:  no rash or lesions. No jaundice GU:  no dysuria, change in color of urine, no urgency or frequency. No straining to urinate.  No flank pain.  Musculoskeletal:  No joint pain or no  joint swelling. No decreased  range of motion. No back pain.  Psych:  No change in mood or affect. No depression or anxiety. No memory loss.  Neuro: no localizing neurological complaints, no tingling, no weakness, no double vision, no gait abnormality, no slurred speech, no confusion  All systems reviewed and apart from [place] all are negative  Past Medical History:   Past Medical History:  Diagnosis Date  . Allergy    bee stings  . Anxiety   . Asthmatic bronchitis   . Broken arm 1957/2008   right  . Cataract    had surgery   . Depression   . Diabetes mellitus without complication ([place])    prediabetes diet controlled  . Diverticulosis   . GERD (gastroesophageal reflux disease)   . Glaucoma    BIL  . Hx of adenomatous colonic polyps 12/02/2010  . Hyperlipidemia   . Hypertension   . Inflammatory arthritis   . Knee bursitis   . Migraines   . Obesity   . Osteoarthritis   . Sleep apnea       Past Surgical History:  Procedure Laterality Date  . CARPAL TUNNEL RELEASE Left    left wrist  . CATARACT EXTRACTION W/ INTRAOCULAR LENS  IMPLANT, BILATERAL Bilateral    1999 left, 2008 right  . COLONOSCOPY    . EXCISION MORTON'S NEUROMA Left 1978   left foot  . GLAUCOMA SURGERY Bilateral    and laser surgery, 2004 left, 2008 right  . PARTIAL HYSTERECTOMY  1991   Left an ovary  . POLYPECTOMY    . TOENAIL AVULSION Right    big toe    Social History:  Ambulatory  walker       reports that she quit smoking about 47 years ago. Her smoking use included cigarettes. She has never used smokeless tobacco. She reports that she does not drink alcohol or use drugs.     Family History:   Family History  Problem Relation Age of Onset  . Colon polyps Maternal Grandfather   . Bone cancer Maternal Grandfather   . Hypertension Mother   . Diabetes Mother   . Hypertension Father   . Diabetes Father   . Asthma Brother   . Obesity Other   . Diabetes Sister        x 2  . Diabetes  Brother        prediabetes  . Rectal cancer Neg Hx   . Stomach cancer Neg Hx   . Esophageal cancer Neg Hx     Allergies: Allergies  Allergen Reactions  . Aspirin Swelling  . Bee Pollen     Extreme swelling due to bee stings  . Codeine Nausea And Vomiting  . Fish Oil Diarrhea  . Lisinopril Cough  . Sulfa Antibiotics Nausea And Vomiting  . Sulfasalazine Nausea And Vomiting  . Vicodin [Hydrocodone-Acetaminophen] Nausea And Vomiting  . Camphor Dermatitis  . Camphor Dermatitis  . Penicillins Rash    Elevated blood pressure     Prior to Admission medications   Medication Sig Start Date End Date Taking? Authorizing Provider  (No Medication Selected) Apply 1 application topically. Carb/gaba/orph/pent/topi    [provider]  Acetaminophen (TYLENOL ARTHRITIS PAIN PO) Take by mouth.    [provider]  atenolol-chlorthalidone (TENORETIC) 50-25 MG per tablet Take 1 tablet by mouth daily.    [provider]  gabapentin (NEURONTIN) 600 MG tablet Take 1 tablet (600 mg total) by mouth 2 (two) times daily. Patient taking differently: Take 300 mg  by mouth 2 (two) times daily.  07/11/16   Narda Amber K, DO  iron polysaccharides (NIFEREX) 150 MG capsule Take 1 capsule (150 mg total) by mouth daily. 09/05/18   Brunetta Genera, MD  LORazepam (ATIVAN) 0.5 MG tablet 0.5 mg.  11/25/15   [provider]  losartan (COZAAR) 100 MG tablet Take 100 mg by mouth daily.  02/24/15   [provider]  meloxicam (MOBIC) 7.5 MG tablet Take 1 tablet (7.5 mg total) by mouth daily. Patient taking differently: Take 15 mg by mouth daily.  05/30/17   Gardiner Barefoot, DPM  metFORMIN (GLUCOPHAGE) 500 MG tablet Take 500 mg by mouth 2 (two) times daily with a meal.  01/22/18   [provider]  Multiple Vitamins-Minerals (ICAPS AREDS FORMULA PO) Take by mouth daily.    [provider]  NONFORMULARY OR COMPOUNDED [place]  Anti-Inflammatory  Cream- Diclofenac 3%, Baclofen 2%, Lidocaine 2% Apply 1-2 grams to affected area 3-4 times daily Qty. 120 gm 3 refills 05/30/17   Gardiner Barefoot, DPM  OLANZapine [facility]) 2.5 MG tablet  05/24/18   [provider]  Omeprazole-Sodium Bicarbonate (ZEGERID) 20-1100 MG CAPS capsule Take 1 capsule by mouth at bedtime. 12/12/14   Gatha Mayer, MD  ONE TOUCH ULTRA TEST test strip  04/17/18   [provider]  pantoprazole (PROTONIX) 40 MG tablet TAKE 1 TABLET BY MOUTH  DAILY BEFORE BREAKFAST Patient taking differently: Take 40 mg by mouth daily.  08/30/16   Gatha Mayer, MD  simvastatin (ZOCOR) 20 MG tablet Take 20 mg by mouth daily at 6 PM.  06/15/16   [provider]   Physical Exam: Blood pressure (!) 147/48, pulse 74, temperature 97.8 F (36.6 C), temperature source Oral, resp. rate 16, height 5\' 2"  (1.575 m), weight 122 kg, SpO2 98 %. 1. General:  in No Acute distress  Chronically ill -appearing 2. Psychological: Alert and   Oriented 3. Head/ENT:   Moist  Mucous Membranes                          Head Non traumatic, neck supple                           Poor Dentition 4. SKIN: normal Skin turgor,  Skin clean Dry  weeping and erythematous on LE bilaterally  5. Heart: Regular rate and rhythm no Murmur, no Rub or gallop 6. Lungs:  Clear to auscultation bilaterally, no wheezes or crackles   7. Abdomen: Soft,  non-tender, Non distended  Obese bowel sounds present 8. Lower extremities: no clubbing, cyanosis, 1+ edema 9. Neurologically Grossly intact, moving all 4 extremities equally  10. MSK: Normal range of motion   LABS:     Recent Labs  Lab 09/04/18 1052 09/04/18 1053 09/08/18 1720  WBC 8.2  --  8.7  NEUTROABS 6.0  --  6.5  HGB 9.4*  --  9.1*  HCT 30.9* 29.2* 30.9*  MCV 90.4  --  93.9  PLT 277  --  485   Basic Metabolic Panel: Recent Labs  Lab 09/04/18 1052 09/08/18 1609  NA 139 140  K 4.7 4.3  CL 108 110  CO2 22 22  GLUCOSE 137* 135*  BUN  45* 54*  CREATININE 1.66* 1.29*  CALCIUM 8.7* 8.5*     Recent Labs  Lab 09/04/18 1052 09/08/18 1609  AST 13* 15  ALT 8  10  ALKPHOS 83 77  BILITOT 0.3 0.8  PROT 6.4* 6.4*  ALBUMIN 2.9* 3.2*   No results for input(s): LIPASE, AMYLASE in the last 168 hours. No results for input(s): AMMONIA in the last 168 hours.    HbA1C: No results for input(s): HGBA1C in the last 72 hours. CBG: No results for input(s): GLUCAP in the last 168 hours.    Urine analysis:    Component Value Date/Time   COLORURINE YELLOW 10/31/2011 1231   APPEARANCEUR CLEAR 10/31/2011 1231   LABSPEC 1.016 10/31/2011 1231   PHURINE 5.5 10/31/2011 1231   GLUCOSEU NEGATIVE 10/31/2011 1231   HGBUR NEGATIVE 10/31/2011 1231   BILIRUBINUR NEGATIVE 10/31/2011 1231   KETONESUR NEGATIVE 10/31/2011 1231   PROTEINUR NEGATIVE 10/31/2011 1231   UROBILINOGEN 0.2 10/31/2011 1231   NITRITE NEGATIVE 10/31/2011 1231   LEUKOCYTESUR NEGATIVE 10/31/2011 1231       Cultures: No results found for: SDES, SPECREQUEST, CULT, REPTSTATUS   Radiological Exams on Admission: Dg Chest 2 View  Result Date: 09/08/2018 CLINICAL DATA:  Shortness of breath. Lower extremity swelling. EXAM: CHEST - 2 VIEW COMPARISON:  07/09/2014 FINDINGS: Low lung volumes are noted. Heart size is within normal limits allowing for low lung volumes. No evidence of pulmonary infiltrate or edema. No evidence of pleural effusion. A moderate size hiatal hernia is noted. IMPRESSION: 1. Low lung volumes. No active lung disease. 2. Moderate hiatal hernia. Electronically Signed   By: Earle Gell M.D.   On: 09/08/2018 16:49    Chart has been reviewed    Assessment/Plan   73 y.o. female with medical history significant of DM, HTN, diastolic CHF     Admitted for diastolic CHF exacerbation   Present on Admission:  . Acute diastolic CHF (congestive heart failure) ([place]) -  - admit on telemetry,  cycle cardiac enzymes,     obtain serial ECG  to evaluate for  ischemia as a cause of heart failure  monitor daily weight:  Filed Weights   09/08/18 1436  Weight: 122 kg      diurese with IV lasix and monitor orthostatics and creatinine to avoid over diuresis.  Order echogram to evaluate EF and valves  ACE/ARBi  On hold given CKD   cardiology folow up scheduled on 09/20/2018 would consult if severe change on echo  . Diabetic polyneuropathy associated with diabetes mellitus due to underlying condition ([place]) - not on Neurontin currently due to leg edema . DM (diabetes mellitus), type 2 with complications ([facility]) -  - Order Sensitive     SSI     -  check TSH and HgA1C  - Hold by mouth medications    Hypothyroidism -- Check TSH continue home medications at current dose   . Essential hypertension - stable, restart Cozaar once creatinine is stable . Leg edema -doubt infectious process no elevated white blood cell count fevers or chills to suggest.  No improvement with Keflex.  Suspect venous stasis and chronic lymphedema likely multifactorial combination of obesity diastolic CHF. Will treat underlying CHF.  Obtain echogram evaluate for right heart failure. Treat sleep apnea as appropriate.  Will need wound consult and long-term wound management.   . Blood in stool -had recent colonoscopy which was unremarkable.  Reports mild occasional blood in stool after significant diarrhea will continue to monitor patient chronically anemic obtain anemia panel restart iron supplement at discharge  . OSA (obstructive sleep apnea) patient has not been compliant with CPAP but currently interested in continue will restart  Anemia chronic followed by oncology will check anemia panel, patient reports occasional small amount of bright with blood in the toilet bowl after bowel movement recently have had a bit more since had some loose BMs.  She feels that this is secondary to hemorrhoids as she has physical discomfort around the anal area.  Other plan as per orders.  DVT  prophylaxis:  SCD    Code Status:  FULL CODE  as per patient  I had personally discussed CODE STATUS with patient    Family Communication:   Family  at  Bedside  plan of care was discussed with   Sister,   Disposition Plan:       To home once workup is complete and patient is stable                     Would benefit from PT/OT eval prior to DC  Ordered                                     Consults called: none  Admission status:   Obs    Level of care    tele  For  24H          Estee Yohe 09/08/2018, 7:59 PM    [facility]  Pager [phone]   after 2 AM please page floor coverage PA If 7AM-7PM, please contact the day team taking care of the patient  [email or web address]  Password [email or web address]

## 2018-09-08 NOTE — ED Notes (Signed)
ED TO INPATIENT HANDOFF REPORT  Name/Age/Gender Megan [name] 73 y.o. female  Code Status   Home/SNF/Other Home  Chief Complaint legs swelling  Level of Care/Admitting Diagnosis ED Disposition    ED Disposition Condition Comment   Admit  Hospital Area: [facility] [[id number]]  Level of Care: Telemetry [5]  Admit to tele based on following criteria: Other see comments  Comments: possible CHF  Diagnosis: Diastolic CHF ([place]) [759163]  Admitting Physician: Toy Baker [[id number]]  Attending Physician: Toy Baker [[id number]]  PT Class (Do Not Modify): Observation [104]  PT Acc Code (Do Not Modify): Observation [10022]       Medical History Past Medical History:  Diagnosis Date  . Allergy    bee stings  . Anxiety   . Asthmatic bronchitis   . Broken arm 1957/2008   right  . Cataract    had surgery   . Depression   . Diabetes mellitus without complication ([place])    prediabetes diet controlled  . Diverticulosis   . GERD (gastroesophageal reflux disease)   . Glaucoma    BIL  . Hx of adenomatous colonic polyps 12/02/2010  . Hyperlipidemia   . Hypertension   . Inflammatory arthritis   . Knee bursitis   . Migraines   . Obesity   . Osteoarthritis   . Sleep apnea     Allergies Allergies  Allergen Reactions  . Aspirin Swelling  . Bee Pollen     Extreme swelling due to bee stings  . Codeine Nausea And Vomiting  . Fish Oil Diarrhea  . Lisinopril Cough  . Sulfa Antibiotics Nausea And Vomiting  . Sulfasalazine Nausea And Vomiting  . Vicodin [Hydrocodone-Acetaminophen] Nausea And Vomiting  . Camphor Dermatitis  . Camphor Dermatitis  . Penicillins Rash    Elevated blood pressure    IV Location/Drains/Wounds Patient Lines/Drains/Airways Status   Active Line/Drains/Airways    Name:   Placement date:   Placement time:   Site:   Days:   Peripheral IV 09/08/18 Left;Upper Arm   09/08/18    1836    Arm   less than 1           Labs/Imaging Results for orders placed or performed during the hospital encounter of 09/08/18 (from the past 48 hour(s))  Comprehensive metabolic panel     Status: Abnormal   Collection Time: 09/08/18  4:09 PM  Result Value Ref Range   Sodium 140 135 - 145 mmol/L   Potassium 4.3 3.5 - 5.1 mmol/L   Chloride 110 98 - 111 mmol/L   CO2 22 22 - 32 mmol/L   Glucose, Bld 135 (H) 70 - 99 mg/dL   BUN 54 (H) 8 - 23 mg/dL   Creatinine, Ser 1.29 (H) 0.44 - 1.00 mg/dL   Calcium 8.5 (L) 8.9 - 10.3 mg/dL   Total Protein 6.4 (L) 6.5 - 8.1 g/dL   Albumin 3.2 (L) 3.5 - 5.0 g/dL   AST 15 15 - 41 U/L   ALT 10 0 - 44 U/L   Alkaline Phosphatase 77 38 - 126 U/L   Total Bilirubin 0.8 0.3 - 1.2 mg/dL   GFR calc non Af Amer 41 (L) >60 mL/min   GFR calc Af Amer 48 (L) >60 mL/min   Anion gap 8 5 - 15    Comment: Performed at [facility], [place] [address], [place], [place] [address]  CBC with Differential/Platelet     Status: Abnormal   Collection Time: 09/08/18  5:20 PM  Result Value Ref Range   WBC 8.7 4.0 - 10.5 K/uL   RBC 3.29 (L) 3.87 - 5.11 MIL/uL   Hemoglobin 9.1 (L) 12.0 - 15.0 g/dL   HCT 30.9 (L) 36.0 - 46.0 %   MCV 93.9 80.0 - 100.0 fL   MCH 27.7 26.0 - 34.0 pg   MCHC 29.4 (L) 30.0 - 36.0 g/dL   RDW 14.5 11.5 - 15.5 %   Platelets 261 150 - 400 K/uL   nRBC 0.0 0.0 - 0.2 %   Neutrophils Relative % 74 %   Neutro Abs 6.5 1.7 - 7.7 K/uL   Lymphocytes Relative 14 %   Lymphs Abs 1.2 0.7 - 4.0 K/uL   Monocytes Relative 8 %   Monocytes Absolute 0.7 0.1 - 1.0 K/uL   Eosinophils Relative 2 %   Eosinophils Absolute 0.2 0.0 - 0.5 K/uL   Basophils Relative 1 %   Basophils Absolute 0.1 0.0 - 0.1 K/uL   Immature Granulocytes 1 %   Abs Immature Granulocytes 0.04 0.00 - 0.07 K/uL    Comment: Performed at [facility], [place] [address] [place], [place] [address]   Dg Chest 2 View  Result Date: 09/08/2018 CLINICAL DATA:  Shortness of breath. Lower extremity  swelling. EXAM: CHEST - 2 VIEW COMPARISON:  07/09/2014 FINDINGS: Low lung volumes are noted. Heart size is within normal limits allowing for low lung volumes. No evidence of pulmonary infiltrate or edema. No evidence of pleural effusion. A moderate size hiatal hernia is noted. IMPRESSION: 1. Low lung volumes. No active lung disease. 2. Moderate hiatal hernia. Electronically Signed   By: Earle Gell M.D.   On: 09/08/2018 16:49   None  Pending Labs Unresulted Labs (From admission, onward)    Start     Ordered   09/09/18 0500  Vitamin B12  (Anemia Panel (PNL))  Tomorrow morning,   R     09/08/18 1915   09/09/18 0500  Folate  (Anemia Panel (PNL))  Tomorrow morning,   R     09/08/18 1915   09/09/18 0500  Iron and TIBC  (Anemia Panel (PNL))  Tomorrow morning,   R     09/08/18 1915   09/09/18 0500  Ferritin  (Anemia Panel (PNL))  Tomorrow morning,   R     09/08/18 1915   09/09/18 0500  Reticulocytes  (Anemia Panel (PNL))  Tomorrow morning,   R     09/08/18 1915   09/08/18 1914  Troponin I - Add-On to previous collection  Add-on,   R     09/08/18 1913   09/08/18 1913  Urinalysis, Routine w reflex microscopic  Once,   R     09/08/18 1913   09/08/18 1855  Brain natriuretic peptide  Add-on,   R     09/08/18 1854   09/08/18 1523  CBC with Differential  ONCE - STAT,   STAT     09/08/18 1522   Signed and Held  Hemoglobin A1c  Tomorrow morning,   R    Comments:  To assess prior glycemic control    Signed and Held   Signed and Held  Basic metabolic panel  Daily,   R     Signed and Held   Signed and Held  Magnesium  Tomorrow morning,   R    Comments:  Call MD if <1.5    Signed and Held   Signed and Held  Phosphorus  Tomorrow morning,   R  Signed and Held   Signed and Held  TSH  Once,   R    Comments:  Cancel if already done within 1 month and notify MD    Signed and Held   Signed and Held  Comprehensive metabolic panel  Once,   R    Comments:  Cal MD for K<3.5 or >5.0    Signed and Held    Signed and Held  CBC  Once,   R    Comments:  Call for hg <8.0    Signed and Held   Signed and Held  Troponin I - Now Then Q6H  Now then every 6 hours,   R     Signed and Held          Vitals/Pain Today's Vitals   09/08/18 1734 09/08/18 1800 09/08/18 1839 09/08/18 1935  BP:  (!) 109/94 (!) 147/48 116/86  Pulse: 68 69 74 77  Resp:  16 16 17   Temp:      TempSrc:      SpO2: 97% 100% 98% 97%  Weight:      Height:        Isolation Precautions No active isolations  Medications Medications - No data to display  Mobility non-ambulatory

## 2018-09-08 NOTE — ED Notes (Signed)
Hospitalist at bedside, will transport after.

## 2018-09-08 NOTE — ED Provider Notes (Signed)
Patient presents with bilateral leg swelling and pain since November 2019.  This past week both lower legs became red, developed stinging pain and were draining clear fluid.  She is been treated by her PCP with diuretics and with Keflex starting 5 days ago.  Today her home health nurse saw her and advised her to go to an urgent care center or the emergency department.  Patient denies any shortness of breath.  Denies fever.  On exam she is alert nontoxic appearing bilateral lower extremities reddened at the shins and lower legs.  Mildly reddened above the knees with corresponding tenderness.  No inguinal nodes.  DP pulses 1+ bilaterally.   Orlie Dakin, MD 09/08/18 7741

## 2018-09-08 NOTE — ED Triage Notes (Signed)
Pt been having bilat leg swelling since November. Was started on antibiotics for cellulitis on 09/03/18. Pt reports swelling and weeping for couple weeks.

## 2018-09-09 ENCOUNTER — Observation Stay (HOSPITAL_BASED_OUTPATIENT_CLINIC_OR_DEPARTMENT_OTHER): Payer: Medicare Other

## 2018-09-09 ENCOUNTER — Observation Stay (HOSPITAL_COMMUNITY): Payer: Medicare Other

## 2018-09-09 DIAGNOSIS — E0842 Diabetes mellitus due to underlying condition with diabetic polyneuropathy: Secondary | ICD-10-CM | POA: Diagnosis not present

## 2018-09-09 DIAGNOSIS — R600 Localized edema: Secondary | ICD-10-CM | POA: Diagnosis not present

## 2018-09-09 DIAGNOSIS — I5030 Unspecified diastolic (congestive) heart failure: Secondary | ICD-10-CM | POA: Diagnosis not present

## 2018-09-09 DIAGNOSIS — E118 Type 2 diabetes mellitus with unspecified complications: Secondary | ICD-10-CM | POA: Diagnosis not present

## 2018-09-09 DIAGNOSIS — I5031 Acute diastolic (congestive) heart failure: Secondary | ICD-10-CM | POA: Diagnosis not present

## 2018-09-09 LAB — COMPREHENSIVE METABOLIC PANEL
ALK PHOS: 63 U/L (ref 38–126)
ALT: 8 U/L (ref 0–44)
AST: 15 U/L (ref 15–41)
Albumin: 2.7 g/dL — ABNORMAL LOW (ref 3.5–5.0)
Anion gap: 8 (ref 5–15)
BUN: 54 mg/dL — ABNORMAL HIGH (ref 8–23)
CALCIUM: 8.2 mg/dL — AB (ref 8.9–10.3)
CO2: 20 mmol/L — ABNORMAL LOW (ref 22–32)
Chloride: 109 mmol/L (ref 98–111)
Creatinine, Ser: 1.71 mg/dL — ABNORMAL HIGH (ref 0.44–1.00)
GFR calc Af Amer: 34 mL/min — ABNORMAL LOW (ref >60)
GFR calc non Af Amer: 29 mL/min — ABNORMAL LOW (ref >60)
Glucose, Bld: 171 mg/dL — ABNORMAL HIGH (ref 70–99)
Potassium: 5 mmol/L (ref 3.5–5.1)
Sodium: 137 mmol/L (ref 135–145)
Total Bilirubin: 0.2 mg/dL — ABNORMAL LOW (ref 0.3–1.2)
Total Protein: 5.7 g/dL — ABNORMAL LOW (ref 6.5–8.1)

## 2018-09-09 LAB — PROTEIN / CREATININE RATIO, URINE
Creatinine, Urine: 108.44 mg/dL
Protein Creatinine Ratio: 0.05 mg/mg{Cre} (ref 0.00–0.15)
Total Protein, Urine: <6 mg/dL

## 2018-09-09 LAB — URINALYSIS, ROUTINE W REFLEX MICROSCOPIC
Bilirubin Urine: NEGATIVE
Glucose, UA: NEGATIVE mg/dL
Hgb urine dipstick: NEGATIVE
KETONES UR: NEGATIVE mg/dL
Nitrite: NEGATIVE
PH: 5 (ref 5.0–8.0)
Protein, ur: NEGATIVE mg/dL
Specific Gravity, Urine: 1.011 (ref 1.005–1.030)

## 2018-09-09 LAB — RETICULOCYTES
Immature Retic Fract: 20.5 % — ABNORMAL HIGH (ref 2.3–15.9)
RBC.: 2.95 MIL/uL — AB (ref 3.87–5.11)
Retic Count, Absolute: 45.7 10*3/uL (ref 19.0–186.0)
Retic Ct Pct: 1.6 % (ref 0.4–3.1)

## 2018-09-09 LAB — IRON AND TIBC
Iron: 20 ug/dL — ABNORMAL LOW (ref 28–170)
Saturation Ratios: 8 % — ABNORMAL LOW (ref 10.4–31.8)
TIBC: 236 ug/dL — ABNORMAL LOW (ref 250–450)
UIBC: 216 ug/dL

## 2018-09-09 LAB — CBC
HCT: 27.2 % — ABNORMAL LOW (ref 36.0–46.0)
Hemoglobin: 8 g/dL — ABNORMAL LOW (ref 12.0–15.0)
MCH: 27.1 pg (ref 26.0–34.0)
MCHC: 29.4 g/dL — ABNORMAL LOW (ref 30.0–36.0)
MCV: 92.2 fL (ref 80.0–100.0)
Platelets: 236 10*3/uL (ref 150–400)
RBC: 2.95 MIL/uL — ABNORMAL LOW (ref 3.87–5.11)
RDW: 14.7 % (ref 11.5–15.5)
WBC: 7.8 10*3/uL (ref 4.0–10.5)
nRBC: 0 % (ref 0.0–0.2)

## 2018-09-09 LAB — GLUCOSE, CAPILLARY
Glucose-Capillary: 137 mg/dL — ABNORMAL HIGH (ref 70–99)
Glucose-Capillary: 137 mg/dL — ABNORMAL HIGH (ref 70–99)
Glucose-Capillary: 145 mg/dL — ABNORMAL HIGH (ref 70–99)
Glucose-Capillary: 133 mg/dL — ABNORMAL HIGH (ref 70–99)
Glucose-Capillary: 156 mg/dL — ABNORMAL HIGH (ref 70–99)

## 2018-09-09 LAB — TROPONIN I
Troponin I: <0.03 ng/mL (ref <0.03)
Troponin I: <0.03 ng/mL (ref <0.03)

## 2018-09-09 LAB — FOLATE: Folate: 22 ng/mL (ref >5.9)

## 2018-09-09 LAB — VITAMIN B12: Vitamin B-12: 177 pg/mL — ABNORMAL LOW (ref 180–914)

## 2018-09-09 LAB — ECHOCARDIOGRAM COMPLETE
Height: 62.5 in
Weight: 4345.71 oz

## 2018-09-09 LAB — FERRITIN: Ferritin: 28 ng/mL (ref 11–307)

## 2018-09-09 LAB — TSH: TSH: 3.265 u[IU]/mL (ref 0.350–4.500)

## 2018-09-09 LAB — PHOSPHORUS: Phosphorus: 5.1 mg/dL — ABNORMAL HIGH (ref 2.5–4.6)

## 2018-09-09 LAB — MAGNESIUM: Magnesium: 2.1 mg/dL (ref 1.7–2.4)

## 2018-09-09 LAB — HEMOGLOBIN A1C
Hgb A1c MFr Bld: 7.1 % — ABNORMAL HIGH (ref 4.8–5.6)
MEAN PLASMA GLUCOSE: 157.07 mg/dL

## 2018-09-09 LAB — BRAIN NATRIURETIC PEPTIDE: B NATRIURETIC PEPTIDE 5: 112 pg/mL — AB (ref 0.0–100.0)

## 2018-09-09 MED ORDER — TRAMADOL HCL 50 MG PO TABS
100.0000 mg | ORAL_TABLET | Freq: Once | ORAL | Status: AC
  Administered 2018-09-09: 100 mg via ORAL
  Filled 2018-09-09: qty 2

## 2018-09-09 MED ORDER — FUROSEMIDE 10 MG/ML IJ SOLN
40.0000 mg | Freq: Once | INTRAMUSCULAR | Status: AC
  Administered 2018-09-09: 40 mg via INTRAVENOUS
  Filled 2018-09-09: qty 4

## 2018-09-09 MED ORDER — VITAMIN B-12 1000 MCG PO TABS
1000.0000 ug | ORAL_TABLET | Freq: Every day | ORAL | Status: DC
  Administered 2018-09-12 – 2018-09-18 (×7): 1000 ug via ORAL
  Filled 2018-09-09 (×7): qty 1

## 2018-09-09 MED ORDER — METOPROLOL TARTRATE 5 MG/5ML IV SOLN: 5.0000 mg | INTRAVENOUS | Status: DC | PRN

## 2018-09-09 MED ORDER — FUROSEMIDE 10 MG/ML IJ SOLN
80.0000 mg | Freq: Two times a day (BID) | INTRAMUSCULAR | Status: DC
  Administered 2018-09-09 – 2018-09-13 (×9): 80 mg via INTRAVENOUS
  Filled 2018-09-09 (×10): qty 8

## 2018-09-09 MED ORDER — CYANOCOBALAMIN 1000 MCG/ML IJ SOLN
1000.0000 ug | Freq: Every day | INTRAMUSCULAR | Status: AC
  Administered 2018-09-09 – 2018-09-11 (×3): 1000 ug via INTRAMUSCULAR
  Filled 2018-09-09 (×3): qty 1

## 2018-09-09 NOTE — Progress Notes (Signed)
Paged on call provider, [place] to notify pt is in Afib RVR HR in 140s. Awaiting call back/orders.

## 2018-09-09 NOTE — Evaluation (Signed)
Physical Therapy Evaluation Patient Details Name: Megan [name] MRN: [id number] DOB: Jan 20, 1946 Today's Date: 09/09/2018   History of Present Illness  Patient is a 73 year old female admitted 09/08/2018 presenting with bilateral leg swelling and pain since 06/2018 with skin breaking down and weeping.  Diastolic CHF. PMH: DM, HTN, CHF, obstructive sleep apnea, hypothyroidism, history of lymphedema. Prior to admission walking with walker. Used to walk with cane recently.    Clinical Impression  Patient unable to perform sit to stand transfer without elevated surface. Patient was able to ambulate with RW 225 feet limited by fatigue/SOB using a slow labored cadence. Patient has a limited assistance from family when DC from hospital and currently is at risk for being unable to perform transfers out of bed and chair independently.  Patient started receiving HHPT and [place] services prior to admission. Patient would continue to benefit from skilled physical therapy in current environment and next venue to continue return to prior function and increase strength, endurance, balance, coordination, and functional mobility and gait skills.     Follow Up Recommendations SNF;Supervision - Intermittent;Supervision for mobility/OOB    Equipment Recommendations  None recommended by PT    Recommendations for Other Services       Precautions / Restrictions Precautions Precautions: Fall Restrictions Weight Bearing Restrictions: No      Mobility  Bed Mobility Overal bed mobility: Needs Assistance Bed Mobility: Supine to Sit;Rolling Rolling: Modified independent (Device/Increase time)   Supine to sit: Min assist;HOB elevated        Transfers Overall transfer level: Needs assistance Equipment used: Rolling walker (2 wheeled) Transfers: Sit to/from [facility] Sit to Stand: From elevated surface;Min assist Stand pivot transfers: Min guard           Ambulation/Gait Ambulation/Gait assistance: Min guard Gait Distance (Feet): 225 Feet Assistive device: Rolling walker (2 wheeled) Gait Pattern/deviations: Step-through pattern;Trunk flexed;Decreased step length - right;Decreased step length - left;Decreased stride length Gait velocity: decreased   General Gait Details: slow, labored cadence, limited by fatigue/SOB  Stairs            Wheelchair Mobility    Modified Rankin (Stroke Patients Only)       Balance Overall balance assessment: Needs assistance Sitting-balance support: No upper extremity supported;Feet supported Sitting balance-Leahy Scale: Good     Standing balance support: Bilateral upper extremity supported;During functional activity Standing balance-Leahy Scale: Fair Standing balance comment: fair with RW                             Pertinent Vitals/Pain Pain Assessment: 0-10 Pain Score: 7  Pain Location: bilateral legs mid thigh down to toes from swelling. Pain Descriptors / Indicators: Stabbing;Discomfort    Home Living Family/patient expects to be discharged to:: Private residence Living Arrangements: Alone Available Help at Discharge: Family;Available PRN/intermittently Type of Home: House Home Access: Stairs to enter Entrance Stairs-Rails: None Entrance Stairs-Number of Steps: 1 Home Layout: One level Home Equipment: Shower seat - built in;Grab bars - tub/shower;Hand held shower head;Cane - single point;Walker - 2 wheels;Walker - 4 wheels;Wheelchair - manual;Bedside commode      Prior Function Level of Independence: Independent with assistive device(s)         Comments: would use 4 wheeled walker in the community; usually used SPC in home; just prior to admission was using RW. Started receiving [place] services/PT 08/24/2018.     Hand Dominance  Extremity/Trunk Assessment   Upper Extremity Assessment Upper Extremity Assessment: Generalized weakness    Lower  Extremity Assessment Lower Extremity Assessment: Generalized weakness       Communication   Communication: No difficulties  Cognition Arousal/Alertness: Awake/alert Behavior During Therapy: WFL for tasks assessed/performed Overall Cognitive Status: Within Functional Limits for tasks assessed                                        General Comments      Exercises     Assessment/Plan    PT Assessment Patient needs continued PT services  PT Problem List Decreased strength;Decreased mobility;Decreased activity tolerance;Decreased skin integrity;Pain;Decreased balance       PT Treatment Interventions DME instruction;Therapeutic activities;Gait training;Therapeutic exercise;Patient/family education;Balance training    PT Goals (Current goals can be found in the Care Plan section)  Acute Rehab PT Goals Patient Stated Goal: get better; return home when safe to function by herself with [facility] services/PT 2x/wk PT Goal Formulation: With patient Time For Goal Achievement: 09/23/18 Potential to Achieve Goals: Good    Frequency Min 3X/week   Barriers to discharge Decreased caregiver support;Inaccessible home environment limited family assistance available; patient unable to stand independently from standard height chair with RW.    Co-evaluation               AM-PAC PT "6 Clicks" Mobility  Outcome Measure Help needed turning from your back to your side while in a flat bed without using bedrails?: A Little Help needed moving from lying on your back to sitting on the side of a flat bed without using bedrails?: A Lot Help needed moving to and from a bed to a chair (including a wheelchair)?: A Lot Help needed standing up from a chair using your arms (e.g., wheelchair or bedside chair)?: A Lot Help needed to walk in hospital room?: A Little Help needed climbing 3-5 steps with a railing? : A Lot 6 Click Score: 14    End of Session Equipment Utilized During  Treatment: Gait belt Activity Tolerance: Patient tolerated treatment well;Patient limited by fatigue Patient left: in chair;with family/visitor present;with nursing/sitter in room;with call bell/phone within reach Nurse Communication: Mobility status PT Visit Diagnosis: Unsteadiness on feet (R26.81);Muscle weakness (generalized) (M62.81);Other abnormalities of gait and mobility (R26.89)    Time: 9702-6378 PT Time Calculation (min) (ACUTE ONLY): 30 min   Charges:   PT Evaluation $PT Eval Low Complexity: 1 Low PT Treatments $Gait Training: 8-22 mins        .Floria Raveling. Hartnett-Rands, MS, PT Per [place] #[id number] 09/09/2018, 1:02 PM

## 2018-09-09 NOTE — Progress Notes (Signed)
  Echocardiogram 2D Echocardiogram has been performed.  Adonay Scheier G Jimi Giza 09/09/2018, 10:04 AM

## 2018-09-09 NOTE — Care Management Note (Addendum)
Case Management Note  Patient Details  Name: Megan [name] MRN: [id number] Date of Birth: [birth year]  Subjective/Objective:  Bilateral lower extremity edema                  Action/Plan: NCM spoke to pt and she lives at home alone. Pt was independent and driving to her appts. She has wheelchair, Rollator, RW and cane at home. States she started feeling week right after Christmas. Pt is agreeable to start SNF rehab process. CSW referral for SNF. Pt active with [facility] and PT.   Expected Discharge Date:                  Expected Discharge Plan:  Skilled Nursing Facility  In-House Referral:  Clinical Social Work  Discharge planning Services  CM Consult  Post Acute Care Choice:    Choice offered to:     DME Arranged:    DME Agency:  NA  HH Arranged:    [place] Agency:     Status of Service:  In process, will continue to follow  If discussed at Long Length of Stay Meetings, dates discussed:    Additional Comments:  Erenest Rasher, RN 09/09/2018, 4:58 PM

## 2018-09-09 NOTE — Plan of Care (Signed)
  Problem: Acute Rehab PT Goals(only PT should resolve) Goal: Pt will Roll Supine to Side Outcome: Progressing Flowsheets (Taken 09/09/2018 1307) Pt will Roll Supine to Side: min guard Goal: Pt Will Go Supine/Side To Sit Outcome: Progressing Flowsheets (Taken 09/09/2018 1307) Pt will go Supine/Side to Sit: with min guard assist Goal: Pt Will Go Sit To Supine/Side Outcome: Progressing Flowsheets (Taken 09/09/2018 1307) Pt will go Sit to Supine/Side: with min guard assist Goal: Patient Will Transfer Sit To/From Stand Outcome: Progressing Flowsheets (Taken 09/09/2018 1307) Patient will transfer sit to/from stand: with min guard assist Goal: Pt Will Transfer Bed To Chair/Chair To Bed Outcome: Progressing Flowsheets (Taken 09/09/2018 1307) Pt will Transfer Bed to Chair/Chair to Bed: min guard assist Goal: Pt Will Ambulate Outcome: Progressing Flowsheets (Taken 09/09/2018 1307) Pt will Ambulate: > 125 feet; with min guard assist; with least restrictive assistive device   Pamala Hurry D. Hartnett-Rands, MS, PT Per [place] [phone] 09/09/2018

## 2018-09-09 NOTE — Care Management Obs Status (Signed)
[place] NOTIFICATION   Patient Details  Name: Megan [name] MRN: [id number] Date of Birth: Jun 22, 1946   Medicare Observation Status Notification Given:  Yes    Erenest Rasher, RN 09/09/2018, 4:56 PM

## 2018-09-09 NOTE — Progress Notes (Addendum)
[facility] PROGRESS NOTE  Megan [name] RFF:[id number] DOB: 1945-11-17 DOA: 09/08/2018  PCP: [name], MD  Brief History/Interval Summary:  73 y.o. female with medical history significant of DM, HTN, diastolic CHF, OSA, hypothyroidism.  Patient has had worsening lower extremity swelling since October/November.  She is gained about 25 pounds in that duration.  She gained about 8 pounds in the last 3 days.  There was some concern for cellulitis and the patient was prescribed Keflex.  Due to worsening symptoms she decided to come into the hospital.  Reason for Visit: Acute diastolic CHF  Consultants: None  Procedures:   Transthoracic echocardiogram Study Conclusions  - Left ventricle: The cavity size was normal. There was moderate   concentric hypertrophy. Systolic function was normal. The   estimated ejection fraction was in the range of 55% to 60%. There   was dynamic obstruction at rest, with a peak velocity of 157   cm/sec and a peak gradient of 10 mm Hg. Wall motion was normal;   there were no regional wall motion abnormalities. Doppler   parameters are consistent with abnormal left ventricular   relaxation (grade 1 diastolic dysfunction). Doppler parameters   are consistent with indeterminate ventricular filling pressure. - Aortic valve: Transvalvular velocity was within the normal range.   There was no stenosis. There was no regurgitation. - Mitral valve: Transvalvular velocity was within the normal range.   There was no evidence for stenosis. There was no regurgitation. - Right ventricle: The cavity size was normal. Wall thickness was   normal. Systolic function was normal. - Atrial septum: No defect or patent foramen ovale was identified   by color flow Doppler. - Tricuspid valve: There was trivial regurgitation. - Pulmonary arteries: Systolic pressure was mildly increased. PA   peak pressure: 40 mm Hg (S). - Pericardium, extracardiac: A small  pericardial effusion was   identified. Features were not consistent with tamponade   physiology.   Antibiotics: None  Subjective/Interval History: Patient very concerned about the swelling in her legs.  She is very concerned that they are infected.  Denies any chest pain.  No shortness of breath at rest.  No symptoms suggestive of orthopnea.  ROS: Denies any headaches.  Objective:  Vital Signs  Vitals:   09/08/18 2022 09/08/18 2104 09/09/18 0219 09/09/18 0616  BP: (!) 134/46  (!) 110/48 (!) 106/47  Pulse: 76  69 70  Resp: 18  (!) 22 18  Temp: 98.2 F (36.8 C)  98.9 F (37.2 C) 98.2 F (36.8 C)  TempSrc:   Oral Oral  SpO2: 100%  95% 96%  Weight:  124.9 kg  123.2 kg  Height:  5' 2.5" (1.588 m)      Intake/Output Summary (Last 24 hours) at 09/09/2018 1250 Last data filed at 09/09/2018 1137 Gross per 24 hour  Intake 363 ml  Output 250 ml  Net 113 ml   Filed Weights   09/08/18 1436 09/08/18 2104 09/09/18 0616  Weight: 122 kg 124.9 kg 123.2 kg    General appearance: alert, cooperative, appears stated age, no distress and morbidly obese Head: Normocephalic, without obvious abnormality, atraumatic Resp: Normal effort at rest.  Diminished air entry at the bases but no wheezing rales or rhonchi. Cardio: regular rate and rhythm, S1, S2 normal, no murmur, click, rub or gallop GI: soft, non-tender; bowel sounds normal; no masses,  no organomegaly Extremities: 2+ pitting edema noted bilateral lower extremities.  Changes suggestive of chronic venous stasis is noted.  Mild erythema.  Weeping is noted.  Warm feet.  Difficult to palpate pulses due to with swelling. Neurologic: no focal neurological deficits.  Lab Results:  Data Reviewed: I have personally reviewed following labs and imaging studies  CBC: Recent Labs  Lab 09/04/18 1052 09/04/18 1053 09/08/18 1720 09/09/18 0831  WBC 8.2  --  8.7 7.8  NEUTROABS 6.0  --  6.5  --   HGB 9.4*  --  9.1* 8.0*  HCT 30.9* 29.2*  30.9* 27.2*  MCV 90.4  --  93.9 92.2  PLT 277  --  261 151    Basic Metabolic Panel: Recent Labs  Lab 09/04/18 1052 09/08/18 1609 09/09/18 0824  NA 139 140 137  K 4.7 4.3 5.0  CL 108 110 109  CO2 22 22 20*  GLUCOSE 137* 135* 171*  BUN 45* 54* 54*  CREATININE 1.66* 1.29* 1.71*  CALCIUM 8.7* 8.5* 8.2*  MG  --   --  2.1  PHOS  --   --  5.1*    GFR: Estimated Creatinine Clearance: 37.6 mL/min (A) (by C-G formula based on SCr of 1.71 mg/dL (H)).  Liver Function Tests: Recent Labs  Lab 09/04/18 1052 09/08/18 1609 09/09/18 0824  AST 13* 15 15  ALT 8 10 8   ALKPHOS 83 77 63  BILITOT 0.3 0.8 0.2*  PROT 6.4* 6.4* 5.7*  ALBUMIN 2.9* 3.2* 2.7*    Cardiac Enzymes: Recent Labs  Lab 09/08/18 2203 09/09/18 0212 09/09/18 0824  TROPONINI <0.03 <0.03 <0.03    CBG: Recent Labs  Lab 09/08/18 2331 09/09/18 0224 09/09/18 0753 09/09/18 1120  GLUCAP 129* 137* 137* 145*    Thyroid Function Tests: Recent Labs    09/09/18 0831  TSH 3.265    Anemia Panel: Recent Labs    09/09/18 0831  VITAMINB12 177*  FOLATE 22.0  FERRITIN 28  TIBC 236*  IRON 20*  RETICCTPCT 1.6     Radiology Studies: Dg Chest 2 View  Result Date: 09/08/2018 CLINICAL DATA:  Shortness of breath. Lower extremity swelling. EXAM: CHEST - 2 VIEW COMPARISON:  07/09/2014 FINDINGS: Low lung volumes are noted. Heart size is within normal limits allowing for low lung volumes. No evidence of pulmonary infiltrate or edema. No evidence of pleural effusion. A moderate size hiatal hernia is noted. IMPRESSION: 1. Low lung volumes. No active lung disease. 2. Moderate hiatal hernia. Electronically Signed   By: Earle Gell M.D.   On: 09/08/2018 16:49     Medications:  Scheduled: . furosemide  40 mg Intravenous BID  . insulin aspart  0-5 Units Subcutaneous QHS  . insulin aspart  0-9 Units Subcutaneous TID WC  . levothyroxine  50 mcg Oral Q0600  . pantoprazole  40 mg Oral Daily  . simvastatin  20 mg Oral  q1800  . sodium chloride flush  3 mL Intravenous Q12H   Continuous: . sodium chloride     VOH:YWVPXT chloride, acetaminophen **OR** acetaminophen, ondansetron **OR** ondansetron (ZOFRAN) IV, sodium chloride flush    Assessment/Plan:    Bilateral lower extremity edema/Anasarca Etiology likely multifactorial.  Patient recently diagnosed with hypothyroidism.  She likely has chronic venous insufficiency.  TSH is now normal.  Patient has morbid obesity.  She is noted to have grade 1 diastolic dysfunction.  Right ventricular function is normal.  LV systolic function is normal.  She does have chronic kidney disease which could be another reason for her edema.  LFTs are normal.  Albumin is noted to be low at 2.7.  We will check a UA to see if she has proteinuria.  Unlikely to be infection as she has normal WBC and is afebrile plus findings not suggestive of an infection.  Continue with IV diuretics.  Her weight was 113 kg in October/November 2019.  Weight here is 123 kg.  Strict ins and outs.  Daily weights.  Acute diastolic CHF See above.  Diabetes mellitus type 2 with renal complications Monitor CBGs..  Check HbA1c.  Recently diagnosed hypothyroidism Continue home medications.  TSH normal.  History of diabetic polyneuropathy Monitor.  Essential hypertension Monitor blood pressure.  Holding ARB due to her elevated creatinine.  Acute renal failure likely with a history of chronic kidney disease unknown stage Creatinine was 1.66 with a BUN of 45 on January 7.  Old labs reviewed.  Creatinine was normal in 2016.  No other labs available.  Care everywhere reviewed as well.  Check UA.  Renal ultrasound.  Patient with history of diabetes which could be the reason for her renal insufficiency.   History of hematochezia Apparently had recent colonoscopy which revealed 2 adenomas. Occasional blood in the stool which could be hemorrhoidal.  History of obstructive sleep apnea Not been compliant  with CPAP.  Normocytic anemia Anemia panel reviewed.  B12 noted to be low at 177.  Folate 22.0.  Ferritin 28.  TIBC 236.  Apparently recently seen at the cancer center by hematology.  Anemia appears to be multifactorial including B12 deficiency as well as a component of iron deficiency due to GI loss.  Vitamin b12 deficiency She will need supplementation which will be ordered.  Morbid obesity Body mass index is 48.89 kg/m.   DVT Prophylaxis: SCD's    Code Status: Full Code  Family Communication: Discussed with the patient and her sister Disposition Plan: Management as outlined above.    LOS: 0 days   Stephane Junkins [employer] on [email or web address]  09/09/2018, 12:50 PM

## 2018-09-09 NOTE — Progress Notes (Signed)
Pt has declined use of CPAP QHS.  RT to monitor and assess as needed.  

## 2018-09-10 ENCOUNTER — Observation Stay (HOSPITAL_BASED_OUTPATIENT_CLINIC_OR_DEPARTMENT_OTHER): Payer: Medicare Other

## 2018-09-10 DIAGNOSIS — I5033 Acute on chronic diastolic (congestive) heart failure: Secondary | ICD-10-CM | POA: Diagnosis present

## 2018-09-10 DIAGNOSIS — E1142 Type 2 diabetes mellitus with diabetic polyneuropathy: Secondary | ICD-10-CM | POA: Diagnosis present

## 2018-09-10 DIAGNOSIS — K921 Melena: Secondary | ICD-10-CM | POA: Diagnosis present

## 2018-09-10 DIAGNOSIS — G4733 Obstructive sleep apnea (adult) (pediatric): Secondary | ICD-10-CM | POA: Diagnosis present

## 2018-09-10 DIAGNOSIS — E876 Hypokalemia: Secondary | ICD-10-CM | POA: Diagnosis present

## 2018-09-10 DIAGNOSIS — I130 Hypertensive heart and chronic kidney disease with heart failure and stage 1 through stage 4 chronic kidney disease, or unspecified chronic kidney disease: Secondary | ICD-10-CM | POA: Diagnosis present

## 2018-09-10 DIAGNOSIS — L03119 Cellulitis of unspecified part of limb: Secondary | ICD-10-CM | POA: Diagnosis present

## 2018-09-10 DIAGNOSIS — E785 Hyperlipidemia, unspecified: Secondary | ICD-10-CM | POA: Diagnosis present

## 2018-09-10 DIAGNOSIS — K219 Gastro-esophageal reflux disease without esophagitis: Secondary | ICD-10-CM | POA: Diagnosis present

## 2018-09-10 DIAGNOSIS — H905 Unspecified sensorineural hearing loss: Secondary | ICD-10-CM | POA: Diagnosis present

## 2018-09-10 DIAGNOSIS — E538 Deficiency of other specified B group vitamins: Secondary | ICD-10-CM

## 2018-09-10 DIAGNOSIS — I471 Supraventricular tachycardia: Secondary | ICD-10-CM | POA: Diagnosis not present

## 2018-09-10 DIAGNOSIS — R609 Edema, unspecified: Secondary | ICD-10-CM | POA: Diagnosis not present

## 2018-09-10 DIAGNOSIS — I313 Pericardial effusion (noninflammatory): Secondary | ICD-10-CM | POA: Diagnosis present

## 2018-09-10 DIAGNOSIS — E1122 Type 2 diabetes mellitus with diabetic chronic kidney disease: Secondary | ICD-10-CM | POA: Diagnosis present

## 2018-09-10 DIAGNOSIS — I872 Venous insufficiency (chronic) (peripheral): Secondary | ICD-10-CM | POA: Diagnosis present

## 2018-09-10 DIAGNOSIS — K649 Unspecified hemorrhoids: Secondary | ICD-10-CM | POA: Diagnosis present

## 2018-09-10 DIAGNOSIS — M7989 Other specified soft tissue disorders: Secondary | ICD-10-CM | POA: Diagnosis present

## 2018-09-10 DIAGNOSIS — N179 Acute kidney failure, unspecified: Secondary | ICD-10-CM | POA: Diagnosis present

## 2018-09-10 DIAGNOSIS — E039 Hypothyroidism, unspecified: Secondary | ICD-10-CM | POA: Diagnosis present

## 2018-09-10 DIAGNOSIS — Z9119 Patient's noncompliance with other medical treatment and regimen: Secondary | ICD-10-CM | POA: Diagnosis not present

## 2018-09-10 DIAGNOSIS — Z961 Presence of intraocular lens: Secondary | ICD-10-CM | POA: Diagnosis present

## 2018-09-10 DIAGNOSIS — N189 Chronic kidney disease, unspecified: Secondary | ICD-10-CM | POA: Diagnosis present

## 2018-09-10 DIAGNOSIS — I10 Essential (primary) hypertension: Secondary | ICD-10-CM | POA: Diagnosis not present

## 2018-09-10 DIAGNOSIS — Z6841 Body Mass Index (BMI) 40.0 and over, adult: Secondary | ICD-10-CM | POA: Diagnosis not present

## 2018-09-10 DIAGNOSIS — I5031 Acute diastolic (congestive) heart failure: Secondary | ICD-10-CM | POA: Diagnosis present

## 2018-09-10 DIAGNOSIS — Z9841 Cataract extraction status, right eye: Secondary | ICD-10-CM | POA: Diagnosis not present

## 2018-09-10 LAB — BASIC METABOLIC PANEL
Anion gap: 10 (ref 5–15)
BUN: 61 mg/dL — ABNORMAL HIGH (ref 8–23)
CO2: 22 mmol/L (ref 22–32)
Calcium: 8.2 mg/dL — ABNORMAL LOW (ref 8.9–10.3)
Chloride: 106 mmol/L (ref 98–111)
Creatinine, Ser: 1.76 mg/dL — ABNORMAL HIGH (ref 0.44–1.00)
GFR calc Af Amer: 33 mL/min — ABNORMAL LOW (ref >60)
GFR, EST NON AFRICAN AMERICAN: 28 mL/min — AB (ref >60)
Glucose, Bld: 157 mg/dL — ABNORMAL HIGH (ref 70–99)
POTASSIUM: 3.9 mmol/L (ref 3.5–5.1)
Sodium: 138 mmol/L (ref 135–145)

## 2018-09-10 LAB — CBC
HCT: 27.8 % — ABNORMAL LOW (ref 36.0–46.0)
HEMOGLOBIN: 8.1 g/dL — AB (ref 12.0–15.0)
MCH: 27.3 pg (ref 26.0–34.0)
MCHC: 29.1 g/dL — ABNORMAL LOW (ref 30.0–36.0)
MCV: 93.6 fL (ref 80.0–100.0)
Platelets: 231 10*3/uL (ref 150–400)
RBC: 2.97 MIL/uL — ABNORMAL LOW (ref 3.87–5.11)
RDW: 14.5 % (ref 11.5–15.5)
WBC: 7.2 10*3/uL (ref 4.0–10.5)
nRBC: 0 % (ref 0.0–0.2)

## 2018-09-10 LAB — GLUCOSE, CAPILLARY
GLUCOSE-CAPILLARY: 129 mg/dL — AB (ref 70–99)
Glucose-Capillary: 137 mg/dL — ABNORMAL HIGH (ref 70–99)
Glucose-Capillary: 173 mg/dL — ABNORMAL HIGH (ref 70–99)

## 2018-09-10 MED ORDER — FUROSEMIDE 10 MG/ML IJ SOLN
60.0000 mg | Freq: Once | INTRAMUSCULAR | Status: DC
  Filled 2018-09-10: qty 6

## 2018-09-10 NOTE — Progress Notes (Signed)
Pt refused CPAP qhs.  Pt states that she hasn't used CPAP in 2 years and needs another sleep study prior to using again.  Pt encouraged to contact RT should she change her mind.

## 2018-09-10 NOTE — Progress Notes (Addendum)
Inpatient Diabetes Program Recommendations  AACE/ADA: New Consensus Statement on Inpatient Glycemic Control (2015)  Target Ranges:  Prepandial:   less than 140 mg/dL      Peak postprandial:   less than 180 mg/dL (1-2 hours)      Critically ill patients:  140 - 180 mg/dL   Results for ANAH, BILLARD (MRN [id number]) as of 09/10/2018 08:58  Ref. Range 09/09/2018 07:53 09/09/2018 11:20 09/09/2018 16:26 09/09/2018 21:50  Glucose-Capillary Latest Ref Range: 70 - 99 mg/dL 137 (H)  1 unit NOVOLOG  145 (H)  1 unit NOVOLOG  133 (H)  1 unit NOVOLOG  156 (H)   Results for SHEVY, YANEY (MRN [id number]) as of 09/10/2018 08:58  Ref. Range 09/10/2018 07:45  Glucose-Capillary Latest Ref Range: 70 - 99 mg/dL 129 (H)   Results for KENLY, HENCKEL (MRN [id number]) as of 09/10/2018 08:58  Ref. Range 09/09/2018 08:31  Hemoglobin A1C Latest Ref Range: 4.8 - 5.6 % 7.1 (H)    Admit with: Acute diastolic CHF/ LE Swelling  History: DM, CHF  Home DM Meds: Metformin 500 mg BID  Current Orders: Novolog Sensitive Correction Scale/ SSI (0-9 units) TID AC + HS       Needs SNF for Rehab post-discharge.  Current A1c of 7.1% shows good glucose control at home.  CBGs well controlled in hospital.    Addendum 11:30am- Met with pt this AM to discuss current A1c of 7.1%.  Congratulated pt for maintaining good glucose control at home.  Pt told me she takes her Metformin on a regular basis.  Had wellness check up with her PCP back in November and all her DM labs were OK at that visit as well.  Pt told me she has a reduced appetite at home since the death of her husband in 21-Mar-2023 of this past year.  Not having low glucose levels at home.  Did not have any questions or concerns about her CBGs at this time.  Discussed with pt that we will give her Novolog SSI here in hospital while her home Metformin is on hold.  Pt stated she was fine with this.    --Will follow patient during  hospitalization--  Wyn Quaker RN, MSN, CDE Diabetes Coordinator Inpatient Glycemic Control Team Team Pager: [phone] (8a-5p)

## 2018-09-10 NOTE — Consult Note (Signed)
[place] Nurse wound consult note Reason for Consult:Cellulitis and edema to lower legs.  Weeping and chronic skin changes. CHF exacerbation and this is being managed.  Has worn Unna boots in the past.  Will change twice weekly and apply Xeroform to lower legs to dry and provide antimicrobial topical treatment.  Wound type:venous insufficiency in the setting of CHF.  Pressure Injury POA: NA Measurement: Edema and erythema to bilateral lower legs.  Drainage (amount, consistency, odor) moderate weeping Periwound: chronic skin changes and edema Dressing procedure/placement/frequency: Cleanse bilateral lower legs with soap and water and pat dry.  Apply xeroform to lower leg erythema.  Apply Unna boots and change twice weekly. BEdside Rn to apply Xeroform and ortho tech to apply [employer].  Will not follow at this time.  Please re-consult if needed.  Domenic Moras MSN, RN, FNP-BC [facility]ON Wound, Ostomy, Continence Nurse Pager [phone]

## 2018-09-10 NOTE — NC FL2 (Signed)
[place] LEVEL OF CARE SCREENING TOOL     IDENTIFICATION  Patient Name: Megan [name] Birthdate: 1945/09/04 Sex: female Admission Date (Current Location): 09/08/2018  [facility] and [place] Number:  Herbalist and Address:  [facility],  [place] [address], [place]      Provider Number: [id number]  Attending Physician Name and Address:  Bonnielee Haff, MD  Relative Name and Phone Number:       Current Level of Care: Hospital Recommended Level of Care: [place] Prior Approval Number:    Date Approved/Denied:   PASRR Number: pending  Discharge Plan: SNF    Current Diagnoses: Patient Active Problem List   Diagnosis Date Noted  . Acute diastolic (congestive) heart failure ([place]) 09/10/2018  . Diastolic CHF ([place]) 99/37/1696  . DM (diabetes mellitus), type 2 with complications ([place]) 78/93/8101  . Essential hypertension 09/08/2018  . Leg edema 09/08/2018  . Acute diastolic CHF (congestive heart failure) ([place]) 09/08/2018  . Blood in stool 09/08/2018  . OSA (obstructive sleep apnea) 09/08/2018  . Sensorineural hearing loss (SNHL) of both ears 07/31/2017  . Lumbar radiculopathy 12/05/2016  . Diabetic polyneuropathy associated with diabetes mellitus due to underlying condition ([place]) 07/09/2015  . Vitamin deficiency related neuropathy 07/09/2015  . Hx of adenomatous colonic polyps 12/02/2010    Orientation RESPIRATION BLADDER Height & Weight     Self, Time, Situation, Place  Normal Continent Weight: 273 lb 13 oz (124.2 kg) Height:  5' 2.5" (158.8 cm)  BEHAVIORAL SYMPTOMS/MOOD NEUROLOGICAL BOWEL NUTRITION STATUS      Continent Diet(carb modified)  AMBULATORY STATUS COMMUNICATION OF NEEDS Skin   Limited Assist Verbally Normal                       Personal Care Assistance Level of Assistance  Bathing, Feeding, Dressing Bathing Assistance: Maximum assistance Feeding assistance: Independent Dressing  Assistance: Limited assistance     Functional Limitations Info  Sight, Hearing, Speech Sight Info: Adequate Hearing Info: Adequate Speech Info: Adequate    SPECIAL CARE FACTORS FREQUENCY  PT (By licensed PT), OT (By licensed OT)     PT Frequency: 5x OT Frequency: 5x            Contractures Contractures Info: Not present    Additional Factors Info  Code Status, Allergies Code Status Info: full code Allergies Info: Aspirin, Bee Pollen, Codeine, Fish Oil, Lisinopril, Sulfa Antibiotics, Sulfasalazine, Vicodin Hydrocodone-acetaminophen, Camphor, Camphor, Penicillins           Current Medications (09/10/2018):  This is the current hospital active medication list Current Facility-Administered Medications  Medication Dose Route Frequency Provider Last Rate Last Dose  . 0.9 %  sodium chloride infusion  250 mL Intravenous PRN Doutova, Anastassia, MD      . acetaminophen (TYLENOL) tablet 650 mg  650 mg Oral Q6H PRN Doutova, Anastassia, MD   650 mg at 09/10/18 1150   Or  . acetaminophen (TYLENOL) suppository 650 mg  650 mg Rectal Q6H PRN Doutova, Anastassia, MD      . cyanocobalamin ((VITAMIN B-12)) injection 1,000 mcg  1,000 mcg Intramuscular Daily Bonnielee Haff, MD   1,000 mcg at 09/10/18 1123   Followed by  . [START ON 09/12/2018] vitamin B-12 (CYANOCOBALAMIN) tablet 1,000 mcg  1,000 mcg Oral Daily Bonnielee Haff, MD      . furosemide (LASIX) injection 60 mg  60 mg Intravenous Once Bonnielee Haff, MD      . furosemide (  LASIX) injection 80 mg  80 mg Intravenous BID Bonnielee Haff, MD   80 mg at 09/10/18 1123  . insulin aspart (novoLOG) injection 0-5 Units  0-5 Units Subcutaneous QHS Doutova, Anastassia, MD      . insulin aspart (novoLOG) injection 0-9 Units  0-9 Units Subcutaneous TID WC Toy Baker, MD   1 Units at 09/10/18 0816  . levothyroxine (SYNTHROID, LEVOTHROID) tablet 50 mcg  50 mcg Oral Q0600 Toy Baker, MD   50 mcg at 09/10/18 0525  . ondansetron  (ZOFRAN) tablet 4 mg  4 mg Oral Q6H PRN Toy Baker, MD   4 mg at 09/08/18 2104   Or  . ondansetron (ZOFRAN) injection 4 mg  4 mg Intravenous Q6H PRN Doutova, Anastassia, MD      . pantoprazole (PROTONIX) EC tablet 40 mg  40 mg Oral Daily Doutova, Anastassia, MD   40 mg at 09/10/18 1123  . simvastatin (ZOCOR) tablet 20 mg  20 mg Oral q1800 Toy Baker, MD   20 mg at 09/09/18 1745  . sodium chloride flush (NS) 0.9 % injection 3 mL  3 mL Intravenous Q12H Doutova, Anastassia, MD   3 mL at 09/10/18 1124  . sodium chloride flush (NS) 0.9 % injection 3 mL  3 mL Intravenous PRN Toy Baker, MD         Discharge Medications: Please see discharge summary for a list of discharge medications.  Relevant Imaging Results:  Relevant Lab Results:   Additional Information SS# [id number]  Nila Nephew, LCSW

## 2018-09-10 NOTE — Progress Notes (Signed)
OT Cancellation Note  Patient Details Name: Megan [name] MRN: [id number] DOB: Dec 31, 1945   Cancelled Treatment:    Reason Eval/Treat Not Completed: Other (comment)  Pt working with nursing on her legs- will check back next day Kari Baars, [place] [phone] Office- [phone], Edwena Felty D 09/10/2018, 4:42 PM

## 2018-09-10 NOTE — Plan of Care (Signed)
  Problem: Education: Goal: Knowledge of General Education information will improve Description: Including pain rating scale, medication(s)/side effects and non-pharmacologic comfort measures Outcome: Progressing   Problem: Health Behavior/Discharge Planning: Goal: Ability to manage health-related needs will improve Outcome: Progressing   Problem: Clinical Measurements: Goal: Ability to maintain clinical measurements within normal limits will improve Outcome: Progressing Goal: Will remain free from infection Outcome: Progressing Goal: Diagnostic test results will improve Outcome: Progressing Goal: Respiratory complications will improve Outcome: Progressing Goal: Cardiovascular complication will be avoided Outcome: Progressing   Problem: Activity: Goal: Risk for activity intolerance will decrease Outcome: Progressing   Problem: Pain Managment: Goal: General experience of comfort will improve Outcome: Progressing   Problem: Safety: Goal: Ability to remain free from injury will improve Outcome: Progressing   

## 2018-09-10 NOTE — Progress Notes (Signed)
[facility] PROGRESS NOTE  Megan [name] [id number]:[id number] DOB: August 23, 1946 DOA: 09/08/2018  PCP: [name], MD  Brief History/Interval Summary:  73 y.o. female with medical history significant of DM, HTN, diastolic CHF, OSA, hypothyroidism.  Patient has had worsening lower extremity swelling since October/November.  She is gained about 25 pounds in that duration.  She gained about 8 pounds in the last 3 days.  There was some concern for cellulitis and the patient was prescribed Keflex.  Due to worsening symptoms she decided to come into the hospital.  Reason for Visit: Acute diastolic CHF  Consultants: None  Procedures:   Transthoracic echocardiogram Study Conclusions  - Left ventricle: The cavity size was normal. There was moderate   concentric hypertrophy. Systolic function was normal. The   estimated ejection fraction was in the range of 55% to 60%. There   was dynamic obstruction at rest, with a peak velocity of 157   cm/sec and a peak gradient of 10 mm Hg. Wall motion was normal;   there were no regional wall motion abnormalities. Doppler   parameters are consistent with abnormal left ventricular   relaxation (grade 1 diastolic dysfunction). Doppler parameters   are consistent with indeterminate ventricular filling pressure. - Aortic valve: Transvalvular velocity was within the normal range.   There was no stenosis. There was no regurgitation. - Mitral valve: Transvalvular velocity was within the normal range.   There was no evidence for stenosis. There was no regurgitation. - Right ventricle: The cavity size was normal. Wall thickness was   normal. Systolic function was normal. - Atrial septum: No defect or patent foramen ovale was identified   by color flow Doppler. - Tricuspid valve: There was trivial regurgitation. - Pulmonary arteries: Systolic pressure was mildly increased. PA   peak pressure: 40 mm Hg (S). - Pericardium, extracardiac: A small  pericardial effusion was   identified. Features were not consistent with tamponade   physiology.   Antibiotics: None  Subjective/Interval History: Patient denies any shortness of breath.  However her leg swelling persists.  May be slightly improved compared to yesterday.  Denies any chest pain.  No nausea or vomiting.  ROS: Denies any headaches.  Objective:  Vital Signs  Vitals:   09/09/18 0219 09/09/18 0616 09/09/18 2152 09/10/18 0625  BP: (!) 110/48 (!) 106/47 (!) 108/46 (!) 130/59  Pulse: 69 70 69 68  Resp: (!) 22 18 16 18   Temp: 98.9 F (37.2 C) 98.2 F (36.8 C) 97.8 F (36.6 C) 97.7 F (36.5 C)  TempSrc: Oral Oral Oral Oral  SpO2: 95% 96% 93% 96%  Weight:  123.2 kg  124.2 kg  Height:        Intake/Output Summary (Last 24 hours) at 09/10/2018 1219 Last data filed at 09/10/2018 1148 Gross per 24 hour  Intake 243 ml  Output 1550 ml  Net -1307 ml   Filed Weights   09/08/18 2104 09/09/18 0616 09/10/18 0625  Weight: 124.9 kg 123.2 kg 124.2 kg   General appearance: Awake alert.  In no distress.  Morbidly obese Resp: Normal effort at rest.  Diminished air entry at the bases.  No crackles.  No wheezing or rhonchi. Cardio: S1-S2 is normal regular.  No S3-S4.  No rubs murmurs or bruit GI: Abdomen is soft.  Nontender nondistended.  Bowel sounds are present normal.  No masses organomegaly Extremities: 1-2+ pitting edema bilateral lower extremity.  Changes suggestive of chronic venous stasis.  Mild erythema.  Weeping is noted. Neurologic:  Alert and oriented x3.  No focal neurological deficits.     Lab Results:  Data Reviewed: I have personally reviewed following labs and imaging studies  CBC: Recent Labs  Lab 09/04/18 1052 09/04/18 1053 09/08/18 1720 09/09/18 0831 09/10/18 0500  WBC 8.2  --  8.7 7.8 7.2  NEUTROABS 6.0  --  6.5  --   --   HGB 9.4*  --  9.1* 8.0* 8.1*  HCT 30.9* 29.2* 30.9* 27.2* 27.8*  MCV 90.4  --  93.9 92.2 93.6  PLT 277  --  261 236 231     Basic Metabolic Panel: Recent Labs  Lab 09/04/18 1052 09/08/18 1609 09/09/18 0824 09/10/18 0500  NA 139 140 137 138  K 4.7 4.3 5.0 3.9  CL 108 110 109 106  CO2 22 22 20* 22  GLUCOSE 137* 135* 171* 157*  BUN 45* 54* 54* 61*  CREATININE 1.66* 1.29* 1.71* 1.76*  CALCIUM 8.7* 8.5* 8.2* 8.2*  MG  --   --  2.1  --   PHOS  --   --  5.1*  --     GFR: Estimated Creatinine Clearance: 36.7 mL/min (A) (by C-G formula based on SCr of 1.76 mg/dL (H)).  Liver Function Tests: Recent Labs  Lab 09/04/18 1052 09/08/18 1609 09/09/18 0824  AST 13* 15 15  ALT 8 10 8   ALKPHOS 83 77 63  BILITOT 0.3 0.8 0.2*  PROT 6.4* 6.4* 5.7*  ALBUMIN 2.9* 3.2* 2.7*    Cardiac Enzymes: Recent Labs  Lab 09/08/18 2203 09/09/18 0212 09/09/18 0824  TROPONINI <0.03 <0.03 <0.03    CBG: Recent Labs  Lab 09/09/18 1120 09/09/18 1626 09/09/18 2150 09/10/18 0745 09/10/18 1154  GLUCAP 145* 133* 156* 129* 137*    Thyroid Function Tests: Recent Labs    09/09/18 0831  TSH 3.265    Anemia Panel: Recent Labs    09/09/18 0831  VITAMINB12 177*  FOLATE 22.0  FERRITIN 28  TIBC 236*  IRON 20*  RETICCTPCT 1.6     Radiology Studies: Dg Chest 2 View  Result Date: 09/08/2018 CLINICAL DATA:  Shortness of breath. Lower extremity swelling. EXAM: CHEST - 2 VIEW COMPARISON:  07/09/2014 FINDINGS: Low lung volumes are noted. Heart size is within normal limits allowing for low lung volumes. No evidence of pulmonary infiltrate or edema. No evidence of pleural effusion. A moderate size hiatal hernia is noted. IMPRESSION: 1. Low lung volumes. No active lung disease. 2. Moderate hiatal hernia. Electronically Signed   By: Earle Gell M.D.   On: 09/08/2018 16:49   US Renal  Result Date: 09/09/2018 CLINICAL DATA:  73 year old with acute renal failure. Current history of hypertension. EXAM: RENAL / URINARY TRACT ULTRASOUND COMPLETE COMPARISON:  CT abdomen and pelvis 11/24/2015 and earlier. No prior  ultrasound. FINDINGS: Right Kidney: Renal measurements: Approximately 8.2 x 5.3 x 4.6 cm = volume: 103 mL . No hydronephrosis. Mild cortical thinning diffusely. No shadowing calculi. Normal parenchymal echotexture. No focal parenchymal abnormality. Left Kidney: Renal measurements: Approximately 8.5 x 4.7 x 3.3 cm = volume: 69 mL. No hydronephrosis. Mild cortical thinning diffusely. No shadowing calculi. Normal parenchymal echotexture. No focal parenchymal abnormality. Bladder: Decompressed and normal in appearance. IMPRESSION: 1. BILATERAL renal atrophy with mild diffuse cortical thinning. 2. No evidence of hydronephrosis to suggest obstruction. No focal parenchymal abnormality involving either kidney. Electronically Signed   By: Evangeline Dakin M.D.   On: 09/09/2018 15:59   Vas [name] Lower Extremity Venous (dvt)  Result Date: 09/10/2018  Lower Venous Study Indications: Edema.  Limitations: Body habitus, poor ultrasound/tissue interface and open wound. Performing Technologist: Oliver Hum RVT  Examination Guidelines: A complete evaluation includes B-mode imaging, spectral Doppler, color Doppler, and power Doppler as needed of all accessible portions of each vessel. Bilateral testing is considered an integral part of a complete examination. Limited examinations for reoccurring indications may be performed as noted.  Right Venous Findings: +---------+---------------+---------+-----------+----------+-------+          CompressibilityPhasicitySpontaneityPropertiesSummary +---------+---------------+---------+-----------+----------+-------+ CFV      Full           Yes      Yes                          +---------+---------------+---------+-----------+----------+-------+ SFJ      Full                                                 +---------+---------------+---------+-----------+----------+-------+ FV Prox  Full                                                  +---------+---------------+---------+-----------+----------+-------+ FV Mid                  Yes      Yes                          +---------+---------------+---------+-----------+----------+-------+ FV Distal               Yes      Yes                          +---------+---------------+---------+-----------+----------+-------+ PFV      Full                                                 +---------+---------------+---------+-----------+----------+-------+ POP      Full           Yes      Yes                          +---------+---------------+---------+-----------+----------+-------+ PTV      Full                                                 +---------+---------------+---------+-----------+----------+-------+ PERO     Full                                                 +---------+---------------+---------+-----------+----------+-------+  Left Venous Findings: +---------+---------------+---------+-----------+----------+--------------+          CompressibilityPhasicitySpontaneityPropertiesSummary        +---------+---------------+---------+-----------+----------+--------------+ CFV      Full  Yes      Yes                                 +---------+---------------+---------+-----------+----------+--------------+ SFJ      Full                                                        +---------+---------------+---------+-----------+----------+--------------+ FV Prox  Full                                                        +---------+---------------+---------+-----------+----------+--------------+ FV Mid   Full                                                        +---------+---------------+---------+-----------+----------+--------------+ FV Distal               Yes      Yes                                 +---------+---------------+---------+-----------+----------+--------------+ PFV      Full                                                         +---------+---------------+---------+-----------+----------+--------------+ POP      Full           Yes      Yes                                 +---------+---------------+---------+-----------+----------+--------------+ PTV                                                   Not visualized +---------+---------------+---------+-----------+----------+--------------+ PERO                                                  Not visualized +---------+---------------+---------+-----------+----------+--------------+    Summary: Right: There is no evidence of deep vein thrombosis in the lower extremity. However, portions of this examination were limited- see technologist comments above. No cystic structure found in the popliteal fossa. Left: There is no evidence of deep vein thrombosis in the lower extremity. However, portions of this examination were limited- see technologist comments above. No cystic structure found in the popliteal fossa.  *See table(s) above for measurements and observations. Electronically signed by Deitra Mayo MD on 09/10/2018 at 10:42:06 AM.    Final  Medications:  Scheduled: . cyanocobalamin  1,000 mcg Intramuscular Daily   Followed by  . [START ON 09/12/2018] vitamin B-12  1,000 mcg Oral Daily  . furosemide  60 mg Intravenous Once  . furosemide  80 mg Intravenous BID  . insulin aspart  0-5 Units Subcutaneous QHS  . insulin aspart  0-9 Units Subcutaneous TID WC  . levothyroxine  50 mcg Oral Q0600  . pantoprazole  40 mg Oral Daily  . simvastatin  20 mg Oral q1800  . sodium chloride flush  3 mL Intravenous Q12H   Continuous: . sodium chloride     VOH:YWVPXT chloride, acetaminophen **OR** acetaminophen, ondansetron **OR** ondansetron (ZOFRAN) IV, sodium chloride flush    Assessment/Plan:    Bilateral lower extremity edema/Anasarca Etiology likely multifactorial.  Patient recently diagnosed with hypothyroidism.   She likely has chronic venous insufficiency.  TSH is now normal.  Patient has morbid obesity.  She is noted to have grade 1 diastolic dysfunction.  Right ventricular function is normal.  LV systolic function is normal.  Patient noted to have elevated creatinine suggestive of chronic kidney disease.  However her UA did not show any significant proteinuria.  Ultrasound of the renal system did not show any significant medical renal disease.  So fluid overload could be due to acute diastolic CHF.  No DVT noted on Doppler study.  LFTs are normal.  Albumin is noted to be low at 2.7.  Unlikely to be infection as she has normal WBC and is afebrile plus findings not suggestive of an infection.    Acute diastolic CHF See above.  Patient has not diuresed much.  We will increase the dose of her Lasix.  Give additional dose this afternoon.  Her weight was 113 kg in October/November 2019.  Weight here is 123 kg.  Strict ins and outs.  Daily weights.  Diabetes mellitus type 2 with renal complications Monitor CBGs.. SSI.  HbA1c 7.1.  Recently diagnosed hypothyroidism Continue home medications.  TSH normal.  History of diabetic polyneuropathy Monitor.  Essential hypertension Monitor blood pressure.  Holding ARB due to her elevated creatinine.  Acute renal failure likely with a history of chronic kidney disease unknown stage Creatinine was 1.66 with a BUN of 45 on January 7.  Old labs reviewed.  Creatinine was normal in 2016.  No other labs available.  Care everywhere reviewed as well.  UA did not show any significant abnormalities.  Renal ultrasound did not show any significant abnormalities either.  Monitor urine output.  Check labs every day while she is getting diuresed.  Patient recently underwent evaluation by hematology.  It appears that SPEP was checked which did not show any M spike.  Normal immunofixation pattern was noted.  History of hematochezia Apparently had recent colonoscopy which revealed 2  adenomas. Occasional blood in the stool which could be hemorrhoidal.  History of obstructive sleep apnea Not been compliant with CPAP.  Normocytic anemia Anemia panel reviewed.  B12 noted to be low at 177.  Folate 22.0.  Ferritin 28.  TIBC 236.  Apparently recently seen at the cancer center by hematology.  Anemia appears to be multifactorial including B12 deficiency as well as a component of iron deficiency due to GI loss.  Vitamin b12 deficiency She is on B12 supplementation.  Morbid obesity Body mass index is 48.89 kg/m.   DVT Prophylaxis: SCD's    Code Status: Full Code  Family Communication: Discussed with the patient and her sister Disposition Plan: Management as outlined above.  LOS: 0 days   Yoltzin Barg [employer] on [email or web address]  09/10/2018, 12:19 PM

## 2018-09-10 NOTE — Progress Notes (Signed)
Bilateral lower extremity venous duplex has been completed. Preliminary results can be found in CV Proc through chart review.   09/10/18 10:04 AM Megan [name] RVT

## 2018-09-11 DIAGNOSIS — E876 Hypokalemia: Secondary | ICD-10-CM

## 2018-09-11 LAB — BASIC METABOLIC PANEL
ANION GAP: 10 (ref 5–15)
BUN: 54 mg/dL — AB (ref 8–23)
CO2: 25 mmol/L (ref 22–32)
Calcium: 8.2 mg/dL — ABNORMAL LOW (ref 8.9–10.3)
Chloride: 105 mmol/L (ref 98–111)
Creatinine, Ser: 1.42 mg/dL — ABNORMAL HIGH (ref 0.44–1.00)
GFR calc Af Amer: 43 mL/min — ABNORMAL LOW (ref >60)
GFR calc non Af Amer: 37 mL/min — ABNORMAL LOW (ref >60)
GLUCOSE: 171 mg/dL — AB (ref 70–99)
POTASSIUM: 3.2 mmol/L — AB (ref 3.5–5.1)
Sodium: 140 mmol/L (ref 135–145)

## 2018-09-11 LAB — GLUCOSE, CAPILLARY
Glucose-Capillary: 187 mg/dL — ABNORMAL HIGH (ref 70–99)
Glucose-Capillary: 153 mg/dL — ABNORMAL HIGH (ref 70–99)
Glucose-Capillary: 167 mg/dL — ABNORMAL HIGH (ref 70–99)
Glucose-Capillary: 131 mg/dL — ABNORMAL HIGH (ref 70–99)
Glucose-Capillary: 190 mg/dL — ABNORMAL HIGH (ref 70–99)

## 2018-09-11 MED ORDER — ADULT MULTIVITAMIN W/MINERALS CH
1.0000 | ORAL_TABLET | Freq: Every day | ORAL | Status: DC
  Administered 2018-09-11 – 2018-09-18 (×8): 1 via ORAL
  Filled 2018-09-11 (×8): qty 1

## 2018-09-11 MED ORDER — PRO-STAT SUGAR FREE PO LIQD
30.0000 mL | Freq: Two times a day (BID) | ORAL | Status: DC
  Administered 2018-09-11 – 2018-09-18 (×15): 30 mL via ORAL
  Filled 2018-09-11 (×15): qty 30

## 2018-09-11 MED ORDER — POTASSIUM CHLORIDE CRYS ER 20 MEQ PO TBCR
40.0000 meq | EXTENDED_RELEASE_TABLET | Freq: Two times a day (BID) | ORAL | Status: AC
  Administered 2018-09-11 (×2): 40 meq via ORAL
  Filled 2018-09-11 (×2): qty 2

## 2018-09-11 MED ORDER — METOPROLOL TARTRATE 25 MG PO TABS
25.0000 mg | ORAL_TABLET | Freq: Two times a day (BID) | ORAL | Status: DC
  Administered 2018-09-11 – 2018-09-18 (×15): 25 mg via ORAL
  Filled 2018-09-11 (×15): qty 1

## 2018-09-11 NOTE — Progress Notes (Addendum)
[facility] PROGRESS NOTE  Megan [name] ZLD:[id number] DOB: 03-11-1946 DOA: 09/08/2018  PCP: [name], MD  Brief History/Interval Summary:  73 y.o. female with medical history significant of DM, HTN, diastolic CHF, OSA, hypothyroidism.  Patient has had worsening lower extremity swelling since October/November.  She is gained about 25 pounds in that duration.  She gained about 8 pounds in the last 3 days.  There was some concern for cellulitis and the patient was prescribed Keflex.  Due to worsening symptoms she decided to come into the hospital.  She was placed on diuretics.  Reason for Visit: Acute diastolic CHF  Consultants: None  Procedures:   Transthoracic echocardiogram Study Conclusions  - Left ventricle: The cavity size was normal. There was moderate   concentric hypertrophy. Systolic function was normal. The   estimated ejection fraction was in the range of 55% to 60%. There   was dynamic obstruction at rest, with a peak velocity of 157   cm/sec and a peak gradient of 10 mm Hg. Wall motion was normal;   there were no regional wall motion abnormalities. Doppler   parameters are consistent with abnormal left ventricular   relaxation (grade 1 diastolic dysfunction). Doppler parameters   are consistent with indeterminate ventricular filling pressure. - Aortic valve: Transvalvular velocity was within the normal range.   There was no stenosis. There was no regurgitation. - Mitral valve: Transvalvular velocity was within the normal range.   There was no evidence for stenosis. There was no regurgitation. - Right ventricle: The cavity size was normal. Wall thickness was   normal. Systolic function was normal. - Atrial septum: No defect or patent foramen ovale was identified   by color flow Doppler. - Tricuspid valve: There was trivial regurgitation. - Pulmonary arteries: Systolic pressure was mildly increased. PA   peak pressure: 40 mm Hg (S). -  Pericardium, extracardiac: A small pericardial effusion was   identified. Features were not consistent with tamponade   physiology.   Antibiotics: None  Subjective/Interval History: Patient states that she is losing weight.  She is lost about 4 pounds in the last 24 hours.  Overall she feels better.  She is able to move her legs more than before.  No chest pain or shortness of breath currently.  ROS: Denies any headaches.  Objective:  Vital Signs  Vitals:   09/10/18 1406 09/10/18 2044 09/11/18 0538 09/11/18 0730  BP: (!) 137/52 (!) 131/52 (!) 132/50   Pulse: 74 86 82   Resp: 15 18 18    Temp: 98.2 F (36.8 C) 98.6 F (37 C) 99 F (37.2 C)   TempSrc: Oral Oral Oral   SpO2: 98% 93% 93%   Weight:    122.2 kg  Height:        Intake/Output Summary (Last 24 hours) at 09/11/2018 1343 Last data filed at 09/11/2018 1124 Gross per 24 hour  Intake 120 ml  Output 3550 ml  Net -3430 ml   Filed Weights   09/09/18 0616 09/10/18 0625 09/11/18 0730  Weight: 123.2 kg 124.2 kg 122.2 kg   General appearance: Awake alert.  In no distress.  Morbidly obese Resp: Clear to auscultation bilaterally.  Normal effort Cardio: S1-S2 is normal regular.  No S3-S4.  No rubs murmurs or bruit GI: Abdomen is soft.  Nontender nondistended.  Bowel sounds are present normal.  No masses organomegaly Extremities: 1-2+ pitting edema bilateral lower extremities going up to the thigh areas.  She has Unna boots on both  her lower legs now.  Improving range of motion of the lower extremities.  She did have changes suggestive of chronic venous stasis.  She had weeping and mild erythema.   Neurologic: Alert and oriented x3.  No focal neurological deficits.     Lab Results:  Data Reviewed: I have personally reviewed following labs and imaging studies  CBC: Recent Labs  Lab 09/08/18 1720 09/09/18 0831 09/10/18 0500  WBC 8.7 7.8 7.2  NEUTROABS 6.5  --   --   HGB 9.1* 8.0* 8.1*  HCT 30.9* 27.2* 27.8*  MCV  93.9 92.2 93.6  PLT 261 236 417    Basic Metabolic Panel: Recent Labs  Lab 09/08/18 1609 09/09/18 0824 09/10/18 0500 09/11/18 0338  NA 140 137 138 140  K 4.3 5.0 3.9 3.2*  CL 110 109 106 105  CO2 22 20* 22 25  GLUCOSE 135* 171* 157* 171*  BUN 54* 54* 61* 54*  CREATININE 1.29* 1.71* 1.76* 1.42*  CALCIUM 8.5* 8.2* 8.2* 8.2*  MG  --  2.1  --   --   PHOS  --  5.1*  --   --     GFR: Estimated Creatinine Clearance: 45.1 mL/min (A) (by C-G formula based on SCr of 1.42 mg/dL (H)).  Liver Function Tests: Recent Labs  Lab 09/08/18 1609 09/09/18 0824  AST 15 15  ALT 10 8  ALKPHOS 77 63  BILITOT 0.8 0.2*  PROT 6.4* 5.7*  ALBUMIN 3.2* 2.7*    Cardiac Enzymes: Recent Labs  Lab 09/08/18 2203 09/09/18 0212 09/09/18 0824  TROPONINI <0.03 <0.03 <0.03    CBG: Recent Labs  Lab 09/10/18 1154 09/10/18 1633 09/10/18 2045 09/11/18 0722 09/11/18 1153  GLUCAP 137* 173* 187* 153* 167*    Thyroid Function Tests: Recent Labs    09/09/18 0831  TSH 3.265    Anemia Panel: Recent Labs    09/09/18 0831  VITAMINB12 177*  FOLATE 22.0  FERRITIN 28  TIBC 236*  IRON 20*  RETICCTPCT 1.6     Radiology Studies: US Renal  Result Date: 09/09/2018 CLINICAL DATA:  73 year old with acute renal failure. Current history of hypertension. EXAM: RENAL / URINARY TRACT ULTRASOUND COMPLETE COMPARISON:  CT abdomen and pelvis 11/24/2015 and earlier. No prior ultrasound. FINDINGS: Right Kidney: Renal measurements: Approximately 8.2 x 5.3 x 4.6 cm = volume: 103 mL . No hydronephrosis. Mild cortical thinning diffusely. No shadowing calculi. Normal parenchymal echotexture. No focal parenchymal abnormality. Left Kidney: Renal measurements: Approximately 8.5 x 4.7 x 3.3 cm = volume: 69 mL. No hydronephrosis. Mild cortical thinning diffusely. No shadowing calculi. Normal parenchymal echotexture. No focal parenchymal abnormality. Bladder: Decompressed and normal in appearance. IMPRESSION: 1.  BILATERAL renal atrophy with mild diffuse cortical thinning. 2. No evidence of hydronephrosis to suggest obstruction. No focal parenchymal abnormality involving either kidney. Electronically Signed   By: Evangeline Dakin M.D.   On: 09/09/2018 15:59   Vas [name] Lower Extremity Venous (dvt)  Result Date: 09/10/2018  Lower Venous Study Indications: Edema.  Limitations: Body habitus, poor ultrasound/tissue interface and open wound. Performing Technologist: Oliver Hum RVT  Examination Guidelines: A complete evaluation includes B-mode imaging, spectral Doppler, color Doppler, and power Doppler as needed of all accessible portions of each vessel. Bilateral testing is considered an integral part of a complete examination. Limited examinations for reoccurring indications may be performed as noted.  Right Venous Findings: +---------+---------------+---------+-----------+----------+-------+          CompressibilityPhasicitySpontaneityPropertiesSummary +---------+---------------+---------+-----------+----------+-------+ CFV      Full  Yes      Yes                          +---------+---------------+---------+-----------+----------+-------+ SFJ      Full                                                 +---------+---------------+---------+-----------+----------+-------+ FV Prox  Full                                                 +---------+---------------+---------+-----------+----------+-------+ FV Mid                  Yes      Yes                          +---------+---------------+---------+-----------+----------+-------+ FV Distal               Yes      Yes                          +---------+---------------+---------+-----------+----------+-------+ PFV      Full                                                 +---------+---------------+---------+-----------+----------+-------+ POP      Full           Yes      Yes                           +---------+---------------+---------+-----------+----------+-------+ PTV      Full                                                 +---------+---------------+---------+-----------+----------+-------+ PERO     Full                                                 +---------+---------------+---------+-----------+----------+-------+  Left Venous Findings: +---------+---------------+---------+-----------+----------+--------------+          CompressibilityPhasicitySpontaneityPropertiesSummary        +---------+---------------+---------+-----------+----------+--------------+ CFV      Full           Yes      Yes                                 +---------+---------------+---------+-----------+----------+--------------+ SFJ      Full                                                        +---------+---------------+---------+-----------+----------+--------------+  FV Prox  Full                                                        +---------+---------------+---------+-----------+----------+--------------+ FV Mid   Full                                                        +---------+---------------+---------+-----------+----------+--------------+ FV Distal               Yes      Yes                                 +---------+---------------+---------+-----------+----------+--------------+ PFV      Full                                                        +---------+---------------+---------+-----------+----------+--------------+ POP      Full           Yes      Yes                                 +---------+---------------+---------+-----------+----------+--------------+ PTV                                                   Not visualized +---------+---------------+---------+-----------+----------+--------------+ PERO                                                  Not visualized  +---------+---------------+---------+-----------+----------+--------------+    Summary: Right: There is no evidence of deep vein thrombosis in the lower extremity. However, portions of this examination were limited- see technologist comments above. No cystic structure found in the popliteal fossa. Left: There is no evidence of deep vein thrombosis in the lower extremity. However, portions of this examination were limited- see technologist comments above. No cystic structure found in the popliteal fossa.  *See table(s) above for measurements and observations. Electronically signed by Deitra Mayo MD on 09/10/2018 at 10:42:06 AM.    Final      Medications:  Scheduled: . furosemide  60 mg Intravenous Once  . furosemide  80 mg Intravenous BID  . insulin aspart  0-5 Units Subcutaneous QHS  . insulin aspart  0-9 Units Subcutaneous TID WC  . levothyroxine  50 mcg Oral Q0600  . pantoprazole  40 mg Oral Daily  . potassium chloride  40 mEq Oral BID  . simvastatin  20 mg Oral q1800  . sodium chloride flush  3 mL Intravenous Q12H  . [START ON 09/12/2018] vitamin B-12  1,000 mcg Oral Daily   Continuous: . sodium chloride  QHU:TMLYYT chloride, acetaminophen **OR** acetaminophen, ondansetron **OR** ondansetron (ZOFRAN) IV, sodium chloride flush    Assessment/Plan:   Acute diastolic CHF/hypokalemia See below as well.  Patient started on high-dose diuretics.  Finally she started diuresing yesterday.  She has lost weight over the last 24 hours.  Her creatinine has improved.  Continue IV diuretics for now.  Replace her potassium. Her weight was 113 kg in October/November 2019.  Weight here was 123 kg at admission.  It has decreased to 122.2 kg.  She is still about 8-9 kg over her baseline weight.  Strict ins and outs and daily weights.  Telemetry showed brief episode of SVT overnight.  Might benefit from beta-blocker which will be initiated.  Bilateral lower extremity edema/Anasarca Etiology  likely multifactorial but appears to be primarily due to acute diastolic CHF.  Patient recently diagnosed with hypothyroidism.  She likely has chronic venous insufficiency.  TSH is now normal.  Patient has morbid obesity.  She is noted to have grade 1 diastolic dysfunction.  Right ventricular function is normal.  LV systolic function is normal.  Patient noted to have elevated creatinine suggestive of chronic kidney disease.  However her UA did not show any significant proteinuria.  Ultrasound of the renal system did not show any significant medical renal disease.  So fluid overload could be due to acute diastolic CHF.  No DVT noted on Doppler study.  LFTs are normal.  Albumin is noted to be low at 2.7.  Unlikely to be infection as she has normal WBC and is afebrile plus findings not suggestive of an infection.  Seen by wound care nurse.  Unna boots.  Continue diuresis as discussed above.  Acute renal failure likely with a history of chronic kidney disease unknown stage Creatinine was 1.66 with a BUN of 45 on January 7.  Old labs reviewed.  Creatinine was normal in 2016.  No other labs available.  Care everywhere reviewed as well.  UA did not show any significant abnormalities.  Renal ultrasound did not show any significant abnormalities either.  Could have been cardiorenal.  Creatinine noted to be better today compared to yesterday.  BUN is also improved.  Continue to monitor labs daily while she is getting diuresed.  Patient recently underwent evaluation by hematology.  It appears that SPEP was checked which did not show any M spike.  Normal immunofixation pattern was noted.  Diabetes mellitus type 2 with renal complications Monitor CBGs. SSI.  HbA1c 7.1.  Recently diagnosed hypothyroidism Continue home medications.  TSH normal.  History of diabetic polyneuropathy Monitor.  Essential hypertension Blood pressure is reasonably well controlled.  Holding ARB due to her elevated creatinine.  History of  hematochezia Recent colonoscopy in October which revealed 2 polyps which were adenomatous. Occasional blood in the stool which could be hemorrhoidal.  None noted here so far.  History of obstructive sleep apnea Not been compliant with CPAP.  She was educated regarding sleep apnea.  Normocytic anemia Anemia panel reviewed.  B12 noted to be low at 177.  Folate 22.0.  Ferritin 28.  TIBC 236.  Apparently recently seen at the cancer center by hematology.  Anemia appears to be multifactorial including B12 deficiency as well as a component of iron deficiency due to GI loss.  Vitamin b12 deficiency, newly detected She is on B12 supplementation.  She was given intramuscular injections for 3 days followed by oral supplementation.  Morbid obesity Body mass index is 48.89 kg/m.   DVT Prophylaxis: SCD's  Code Status: Full Code  Family Communication: Discussed with the patient and her sister Disposition Plan: Management as outlined above.    LOS: 1 day   Artemisa Sladek [employer] on [email or web address]  09/11/2018, 1:43 PM

## 2018-09-11 NOTE — Progress Notes (Signed)
Physical Therapy Treatment Patient Details Name: Megan [name] MRN: [id number] DOB: [birth year] Today's Date: 09/11/2018    History of Present Illness 73 year old female admitted 09/08/2018 presenting with bilateral leg swelling and pain since 06/2018 with skin breaking down and weeping.  Diastolic CHF. PMH: DM, HTN, CHF, obstructive sleep apnea, hypothyroidism, history of lymphedema. Prior to admission walking with walker. Used to walk with cane recently.    PT Comments    Pt continues to participate well. Discussed d/c plan during session. Pt stated she is unsure if she can manage at home alone. She is fearful of falling. Continue to recommend SNF [facility] to regain functional independence.     Follow Up Recommendations  SNF     Equipment Recommendations  None recommended by PT    Recommendations for Other Services       Precautions / Restrictions Precautions Precautions: Fall Restrictions Weight Bearing Restrictions: No    Mobility  Bed Mobility Overal bed mobility: Needs Assistance       Supine to sit: Min assist     General bed mobility comments: Assist for LEs.   Transfers Overall transfer level: Needs assistance Equipment used: Rolling walker (2 wheeled) Transfers: Sit to/from Stand Sit to Stand: Min assist         General transfer comment: Assist to rise, stabilize, control descent. VCs safety, technique, hand placement. Increased difficulty rising from low toilet surface  Ambulation/Gait Ambulation/Gait assistance: Min guard Gait Distance (Feet): 175 Feet Assistive device: Rolling walker (2 wheeled) Gait Pattern/deviations: Step-through pattern;Decreased stride length     General Gait Details: slow, labored cadence, limited by fatigue/SOB. 2 brief standing rest breaks needed/taken.    Stairs             Wheelchair Mobility    Modified Rankin (Stroke Patients Only)       Balance Overall balance assessment: Needs assistance          Standing balance support: Bilateral upper extremity supported Standing balance-Leahy Scale: Poor                              Cognition Arousal/Alertness: Awake/alert Behavior During Therapy: WFL for tasks assessed/performed Overall Cognitive Status: Within Functional Limits for tasks assessed                                        Exercises      General Comments        Pertinent Vitals/Pain Pain Assessment: Faces Faces Pain Scale: Hurts little more Pain Location: bil lower legs Pain Descriptors / Indicators: Tender;Discomfort Pain Intervention(s): Monitored during session;Repositioned    Home Living                      Prior Function            PT Goals (current goals can now be found in the care plan section) Progress towards PT goals: Progressing toward goals    Frequency    Min 3X/week      PT Plan Current plan remains appropriate    Co-evaluation              AM-PAC PT "6 Clicks" Mobility   Outcome Measure  Help needed turning from your back to your side while in a flat bed without using bedrails?: A Little Help needed  moving from lying on your back to sitting on the side of a flat bed without using bedrails?: A Little Help needed moving to and from a bed to a chair (including a wheelchair)?: A Little Help needed standing up from a chair using your arms (e.g., wheelchair or bedside chair)?: A Little Help needed to walk in hospital room?: A Little Help needed climbing 3-5 steps with a railing? : A Lot 6 Click Score: 17    End of Session   Activity Tolerance: Patient limited by fatigue Patient left: in bed;with call bell/phone within reach;with family/visitor present   PT Visit Diagnosis: Unsteadiness on feet (R26.81);Muscle weakness (generalized) (M62.81)     Time: 1350-1410 PT Time Calculation (min) (ACUTE ONLY): 20 min  Charges:  $Gait Training: 8-22 mins                        Weston Anna, PT Acute Rehabilitation Services Pager: [phone] Office: [phone]

## 2018-09-11 NOTE — Evaluation (Signed)
Occupational Therapy Evaluation Patient Details Name: Megan [name] MRN: [id number] DOB: 13-Dec-1945 Today's Date: 09/11/2018    History of Present Illness 73 year old female admitted 09/08/2018 presenting with bilateral leg swelling and pain since 06/2018 with skin breaking down and weeping.  Diastolic CHF. PMH: DM, HTN, CHF, obstructive sleep apnea, hypothyroidism, history of lymphedema. Prior to admission walking with walker. Used to walk with cane recently.   Clinical Impression   This 73 y/o female presents with the above. At baseline pt reports she is mod independent with ADL and functional mobility, reports recently began using RW prior to this admission. Pt currently requiring minA for short distance mobility in room using RW, requires minguard assist for seated UB ADL, mod-maxA for LB ADL. Pt pleasant throughout session and motivated to return to PLOF. Pt lives alone and reports she only has intermittent assist at home. She will benefit from continued acute OT services and given decreased availability of caregiver support at time of discharge recommend follow up therapy services in SNF setting to maximize her overall safety and independence with ADL and mobility prior to return home. Will follow.     Follow Up Recommendations  SNF;Supervision/Assistance - 24 hour    Equipment Recommendations  None recommended by OT           Precautions / Restrictions Precautions Precautions: Fall Restrictions Weight Bearing Restrictions: No      Mobility Bed Mobility Overal bed mobility: Needs Assistance Bed Mobility: Supine to Sit;Sit to Supine     Supine to sit: Min assist Sit to supine: Mod assist   General bed mobility comments: Assist for LEs.   Transfers Overall transfer level: Needs assistance Equipment used: Rolling walker (2 wheeled) Transfers: Sit to/from Stand Sit to Stand: Min assist         General transfer comment: Assist to rise, stabilize, control descent.  VCs safety, technique, hand placement.     Balance Overall balance assessment: Needs assistance Sitting-balance support: No upper extremity supported;Feet supported Sitting balance-Leahy Scale: Good     Standing balance support: Bilateral upper extremity supported Standing balance-Leahy Scale: Poor                             ADL either performed or assessed with clinical judgement   ADL Overall ADL's : Needs assistance/impaired Eating/Feeding: Modified independent;Sitting   Grooming: Set up;Sitting   Upper Body Bathing: Min guard;Sitting   Lower Body Bathing: Moderate assistance;Sit to/from stand   Upper Body Dressing : Min guard;Sitting   Lower Body Dressing: Maximal assistance;Sit to/from stand Lower Body Dressing Details (indicate cue type and reason): minA standing balance Toilet Transfer: Minimal assistance;Stand-pivot;BSC;RW Toilet Transfer Details (indicate cue type and reason): simulated via short distance mobility in room, transfer to/from [place] and Hygiene: Moderate assistance;Sit to/from stand       Functional mobility during ADLs: Minimal assistance;Rolling walker       Vision         Perception     Praxis      Pertinent Vitals/Pain Pain Assessment: Faces Faces Pain Scale: Hurts little more Pain Location: bil lower legs Pain Descriptors / Indicators: Tender;Discomfort Pain Intervention(s): Monitored during session;Limited activity within patient's tolerance;Repositioned     Hand Dominance     Extremity/Trunk Assessment Upper Extremity Assessment Upper Extremity Assessment: Generalized weakness   Lower Extremity Assessment Lower Extremity Assessment: Defer to PT evaluation       Communication Communication  Communication: No difficulties   Cognition Arousal/Alertness: Awake/alert Behavior During Therapy: WFL for tasks assessed/performed Overall Cognitive Status: Within Functional Limits for  tasks assessed                                     General Comments  pt's sister present during session    Exercises     Shoulder Instructions      Home Living Family/patient expects to be discharged to:: Private residence Living Arrangements: Alone Available Help at Discharge: Family;Available PRN/intermittently Type of Home: House Home Access: Stairs to enter Entrance Stairs-Number of Steps: 1 Entrance Stairs-Rails: None Home Layout: One level     Bathroom Shower/Tub: Occupational psychologist: Handicapped height     Home Equipment: Shower seat - built in;Grab bars - tub/shower;Hand held shower head;Cane - single point;Walker - 2 wheels;Walker - 4 wheels;Wheelchair - [employer];Tub bench          Prior Functioning/Environment Level of Independence: Independent with assistive device(s)        Comments: would use 4 wheeled walker in the community; usually used SPC in home; just prior to admission was using RW. Started receiving [place] services/PT 08/24/2018.        OT Problem List: Decreased strength;Decreased range of motion;Decreased activity tolerance;Impaired balance (sitting and/or standing);Obesity;Pain      OT Treatment/Interventions: Self-care/ADL training;Therapeutic exercise;Neuromuscular education;DME and/or AE instruction;Patient/family education;Balance training    OT Goals(Current goals can be found in the care plan section) Acute Rehab OT Goals Patient Stated Goal: get better, return home when safe to do so without falling OT Goal Formulation: With patient Time For Goal Achievement: 09/25/18 Potential to Achieve Goals: Good  OT Frequency: Min 2X/week   Barriers to D/C: Decreased caregiver support  only has intermittent assist at time of discharge       Co-evaluation              AM-PAC OT "6 Clicks" Daily Activity     Outcome Measure Help from another person eating meals?: None Help from another person  taking care of personal grooming?: A Little Help from another person toileting, which includes using toliet, bedpan, or urinal?: A Lot Help from another person bathing (including washing, rinsing, drying)?: A Lot Help from another person to put on and taking off regular upper body clothing?: None Help from another person to put on and taking off regular lower body clothing?: A Lot 6 Click Score: 17   End of Session Equipment Utilized During Treatment: Rolling walker Nurse Communication: Mobility status  Activity Tolerance: Patient tolerated treatment well Patient left: in bed;with call bell/phone within reach;with bed alarm set  OT Visit Diagnosis: Other abnormalities of gait and mobility (R26.89);Muscle weakness (generalized) (M62.81)                Time: 0712-1975 OT Time Calculation (min): 34 min Charges:  OT General Charges $OT Visit: 1 Visit OT Evaluation $OT Eval Moderate Complexity: 1 Mod OT Treatments $Self Care/Home Management : 8-22 mins  Lou Cal, OT Supplemental Rehabilitation Services Pager [phone] Office [place] 09/11/2018, 5:09 PM

## 2018-09-11 NOTE — Progress Notes (Signed)
Initial Nutrition Assessment  DOCUMENTATION CODES:   Morbid obesity  INTERVENTION:    30 ml Prostat BID, each supplement provides 100 kcals and 15 grams protein.   Provide MVI daily  NUTRITION DIAGNOSIS:   Increased nutrient needs related to wound healing as evidenced by estimated needs.  GOAL:   Patient will meet greater than or equal to 90% of their needs  MONITOR:   PO intake, Supplement acceptance, Weight trends, Labs, I & O's  REASON FOR ASSESSMENT:   Consult Diet education, Wound healing  ASSESSMENT:   Patient with PMH significant for depression, DM, diverticulosis, GERD, CKD (unknown stage), HLD, HTN, and CHF. Presents this admission with acute CHF and bilateral leg edema/anasarca.    Pt endorses a decreased appetite since June 2019 when her husband passed from cancer. States she in June she weighed 260 lb and lost down to 230 lb unintentionally. Since June, she tried to eat  three meals daily (consist of egg salad sandwich, meats, frozen vegetables), but could only finish 25-50% each time. Pt reports she recently gained 20 lb of fluid in the last couple of weeks. She is currently on a 1200 ml fluid restriction. Discussed the importance of protein intake for preservation of lean body mass. Included protein supplement information in to aid with wound healing. Pt willing to try Prostat this admission.   Weight noted to decrease from 275 lb on 1/11 to 269 lb today.   I/O: -5.1 L since admit UOP: 3.6 L x 24 hrs   Pt reports no prior education on limiting sodium intake. She recently switched from laxix to Bumex, per her PCP.   RD provided "Low Sodium Nutrition Therapy" handout from the Academy of Nutrition and Dietetics. Reviewed patient's dietary recall. Provided examples on ways to decrease sodium intake in diet. Discouraged intake of processed foods and use of salt shaker. Encouraged fresh fruits and vegetables as well as whole grain sources of carbohydrates to  maximize fiber intake. RD discussed why it is important for patient to adhere to diet recommendations, and emphasized the role of fluids, foods to avoid, and importance of weighing self daily. Teach back method used.  Medications reviewed and include: 80 mg lasix BID, SSI, Vit B12 Labs reviewed: K 3.2 (L) CBG 129-187  NUTRITION - FOCUSED PHYSICAL EXAM:    Most Recent Value  Orbital Region  No depletion  Upper Arm Region  No depletion  Thoracic and Lumbar Region  Unable to assess  Buccal Region  No depletion  Temple Region  No depletion  Clavicle Bone Region  No depletion  Clavicle and Acromion Bone Region  No depletion  Scapular Bone Region  Unable to assess  Dorsal Hand  No depletion  Patellar Region  No depletion  Anterior Thigh Region  No depletion  Posterior Calf Region  No depletion  Edema (RD Assessment)  Moderate  Hair  Reviewed  Eyes  Reviewed  Mouth  Reviewed  Skin  Reviewed  Nails  Reviewed     Diet Order:   Diet Order            Diet Carb Modified Fluid consistency: Thin; Room service appropriate? Yes  Diet effective now              EDUCATION NEEDS:   Education needs have been addressed  Skin:  Skin Assessment: Skin Integrity Issues: Skin Integrity Issues:: Other (Comment) Other: cellulitis bilateral legs   Last BM:  1/12  Height:   Ht Readings from Last 1  Encounters:  09/08/18 5' 2.5" (1.588 m)    Weight:   Wt Readings from Last 1 Encounters:  09/11/18 122.2 kg    Ideal Body Weight:  52.3 kg  BMI:  Body mass index is 48.49 kg/m.  Estimated Nutritional Needs:   Kcal:  1600-1800 kcal  Protein:  85-100 grams  Fluid:  >/= 1.6 L/day   Mariana Single RD, LDN Clinical Nutrition Pager # - [phone]

## 2018-09-11 NOTE — Progress Notes (Signed)
Patient does not want to wear CPAP while she is in the hospital. Encouraged patient to call when she changes her mind,

## 2018-09-12 LAB — CBC
HCT: 29.1 % — ABNORMAL LOW (ref 36.0–46.0)
Hemoglobin: 8.9 g/dL — ABNORMAL LOW (ref 12.0–15.0)
MCH: 27.4 pg (ref 26.0–34.0)
MCHC: 30.6 g/dL (ref 30.0–36.0)
MCV: 89.5 fL (ref 80.0–100.0)
Platelets: 273 10*3/uL (ref 150–400)
RBC: 3.25 MIL/uL — ABNORMAL LOW (ref 3.87–5.11)
RDW: 14.5 % (ref 11.5–15.5)
WBC: 6.6 10*3/uL (ref 4.0–10.5)
nRBC: 0 % (ref 0.0–0.2)

## 2018-09-12 LAB — GLUCOSE, CAPILLARY
Glucose-Capillary: 159 mg/dL — ABNORMAL HIGH (ref 70–99)
Glucose-Capillary: 166 mg/dL — ABNORMAL HIGH (ref 70–99)
Glucose-Capillary: 169 mg/dL — ABNORMAL HIGH (ref 70–99)

## 2018-09-12 LAB — BASIC METABOLIC PANEL
Anion gap: 11 (ref 5–15)
BUN: 53 mg/dL — AB (ref 8–23)
CO2: 26 mmol/L (ref 22–32)
Calcium: 8.2 mg/dL — ABNORMAL LOW (ref 8.9–10.3)
Chloride: 105 mmol/L (ref 98–111)
Creatinine, Ser: 1.3 mg/dL — ABNORMAL HIGH (ref 0.44–1.00)
GFR calc Af Amer: 47 mL/min — ABNORMAL LOW (ref >60)
GFR calc non Af Amer: 41 mL/min — ABNORMAL LOW (ref >60)
Glucose, Bld: 172 mg/dL — ABNORMAL HIGH (ref 70–99)
Potassium: 3.3 mmol/L — ABNORMAL LOW (ref 3.5–5.1)
SODIUM: 142 mmol/L (ref 135–145)

## 2018-09-12 LAB — MAGNESIUM: Magnesium: 1.6 mg/dL — ABNORMAL LOW (ref 1.7–2.4)

## 2018-09-12 MED ORDER — METOLAZONE 2.5 MG PO TABS
2.5000 mg | ORAL_TABLET | Freq: Once | ORAL | Status: AC
  Administered 2018-09-13: 2.5 mg via ORAL
  Filled 2018-09-12: qty 1

## 2018-09-12 MED ORDER — MAGNESIUM OXIDE 400 (241.3 MG) MG PO TABS
400.0000 mg | ORAL_TABLET | Freq: Two times a day (BID) | ORAL | Status: DC
  Administered 2018-09-12 – 2018-09-13 (×4): 400 mg via ORAL
  Filled 2018-09-12 (×4): qty 1

## 2018-09-12 MED ORDER — POTASSIUM CHLORIDE CRYS ER 20 MEQ PO TBCR
40.0000 meq | EXTENDED_RELEASE_TABLET | Freq: Three times a day (TID) | ORAL | Status: AC
  Administered 2018-09-12 (×2): 40 meq via ORAL
  Filled 2018-09-12 (×2): qty 2

## 2018-09-12 MED ORDER — POLYETHYLENE GLYCOL 3350 17 G PO PACK
17.0000 g | PACK | Freq: Every day | ORAL | Status: DC
  Administered 2018-09-12 – 2018-09-18 (×6): 17 g via ORAL
  Filled 2018-09-12 (×7): qty 1

## 2018-09-12 NOTE — Clinical Social Work Note (Addendum)
Received approved PASRR #[id number] E Expiration date 10/12/2018 Clinical Social Work Assessment  Patient Details  Name: Megan [name] MRN: [id number] Date of Birth: 11/01/1945  Date of referral:  09/12/18               Reason for consult:  Facility Placement                Permission sought to share information with:    Permission granted to share information::     Name::        Agency::     Relationship::     Contact Information:     Housing/Transportation Living arrangements for the past 2 months:  Single Family Home Source of Information:  Patient Patient Interpreter Needed:  None Criminal Activity/Legal Involvement Pertinent to Current Situation/Hospitalization:  No - Comment as needed Significant Relationships:  Friend Lives with:  Self Do you feel safe going back to the place where you live?  Yes Need for family participation in patient care:  No ([place])(no family)  Care giving concerns:  Pt admitted from home where she resides alone. 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 Admitted for CHF   Social Worker assessment / plan:  CSW consulted to assist with disposition- SNF recommended for short term rehab.  Met with pt at bedside, pt very welcoming of CSW involvement and understanding of SNF placement process CSW explained. CSW made referrals and will follow up with bed offers.  Will need [facility] Medicare prior authorization.   Employment status:  Retired Engineer, maintenance (IT), [employer]) PT Recommendations:  [place] / Referral to community resources:  [place]  Patient/Family's Response to care:  appreciative  Patient/Family's Understanding of and Emotional Response to Diagnosis, Current Treatment, and Prognosis:  Pt seemed to have good  understanding of her status and treatment, described to CSW as reflected in chart. Emotionally was very motivated to rehab and return home- reports that she is adjusting to "empty house after years of taking care of my husband." Reflected on her career history and enjoyment of their pet cats over the years.  Emotional Assessment Appearance:  Appears stated age Attitude/Demeanor/Rapport:  Engaged Affect (typically observed):  Accepting, Calm, Adaptable, Pleasant Orientation:  Oriented to Self, Oriented to Place, Oriented to  Time, Oriented to Situation Alcohol / Substance use:  Not Applicable Psych involvement (Current and /or in the community):  No (Comment)  Discharge Needs  Concerns to be addressed:  Discharge Planning Concerns Readmission within the last 30 days:  No Current discharge risk:  Dependent with Mobility, Lives alone Barriers to Discharge:  Continued Medical Work up, [employer], LCSW 09/12/2018, 4:23 PM [phone]

## 2018-09-12 NOTE — Progress Notes (Signed)
Patient continues to decline nocturnal CPAP. Order changed to prn per RT protocol.  

## 2018-09-12 NOTE — Progress Notes (Signed)
[facility]ITAL[facility] PROGRESS NOTE  Megan [name] IHK:[id number] DOB: 1946/05/20 DOA: 09/08/2018 PCP: [name], MD   Narrative: 73 year old female Known history of DM TY 2, HTN  diastolic heart failure OSA noncompliant on CPAP Hypothyroid Normocytic anemia on IV iron Sensorineural hearing loss  Admitted to the hospital 1/11 with shortness of breath bilateral leg pain and weeping-had attempted to treat this with Keflex and adjust diuretics to Bumex as well as put on Unna boots but she is gained about 25 pounds since November Stopped using her BiPAP times many years--previously used to use a cane but has needed a walker recently    A & Plan Acute diastolic heart failure Chronic venous insufficiency as well as morbid obesity and on echo has grade 1 diastolic dysfunction Continue diuretics Lasix 80 twice daily IV and follow fluid status Anasarca-baseline weight 113 kg and of 2019, on admission 123 chronic venous insufficiency, hypoalbuminemia etc.-continue Unna boots and changed twice weekly-appreciate wound care input-Xeroform dressings-continue diuretics-adding 1 dose of metolazone 2.5 mg tomorrow morning OSA noncompliant on CPAP- has elevated PA peak pressure will probably need to use this in the future has followed with pulmonology in the past will need appointment on discharge AKI on admission- creatinine normal 2016 elevated 45/1.66-no M spike on follow-up renal ultrasound normal and likely secondary to cardiorenal syndrome-at this time holding Tenoretic and losartan Diabetes mellitus type 2 A1c 7.1- continue sliding scale coverage-not on prior to admission meds?  Will probably need some education and may need to see start glipizide on discharge Normocytic anemia, B12 deficiency- B12 low 177 getting injections and also has had IV iron Has had adenomatous polyps and occasional blood with hemorrhoids Newly diagnosed hypothyroidism Continue meds recheck as  outpatient Hypokalemia Increase potassium to 40 3 times daily check a.m. magnesium, replace magnesium 400 twice daily Superobesity, BMI 48 Life-threatening  Lovenox, full code, discussed with patient alone-will need further diuresis over several days to ensure negative fluid balance  Verlon Au, MD  [facility] Direct contact: [phone] --Via amion app [place]  --[email or web address]; password [email or web address]  7PM-7AM contact night coverage as above 09/12/2018, 3:01 PM  LOS: 2 days      Interval history/Subjective: Awake alert pleasant no distress  Eating fairly well without any problems no chest pain no fever no chills  Objective:  Vitals:  Vitals:   09/12/18 0854 09/12/18 1314  BP: 133/65 127/78  Pulse:  86  Resp:  18  Temp:    SpO2:  96%    Exam:   EOMI NCAT Thick neck Mallampati 4 no icterus no pallor Chest is clear without added sound Abdomen is obese nontender nondistended no rebound no guarding lower extremities are wrapped neurologically intact moving all 4 limbs sensory is grossly intact   I have personally reviewed the following:  DATA   Labs:  Potassium 3.3 magnesium 1.6 BUN/creatinine stable 53/1.3 from prior days studies  -6.6 L, weight down from 123 to 120 kg  Scheduled Meds: . feeding supplement (PRO-STAT SUGAR FREE 64)  30 mL Oral BID  . furosemide  80 mg Intravenous BID  . insulin aspart  0-5 Units Subcutaneous QHS  . insulin aspart  0-9 Units Subcutaneous TID WC  . levothyroxine  50 mcg Oral Q0600  . magnesium oxide  400 mg Oral BID  . metoprolol tartrate  25 mg Oral BID  . multivitamin with minerals  1 tablet Oral Daily  . pantoprazole  40 mg Oral Daily  . polyethylene glycol  17 g Oral Daily  .  potassium chloride  40 mEq Oral TID  . simvastatin  20 mg Oral q1800  . sodium chloride flush  3 mL Intravenous Q12H  . vitamin B-12  1,000 mcg Oral Daily   Continuous Infusions: . sodium chloride      Active Problems:   Diabetic polyneuropathy  associated with diabetes mellitus due to underlying condition (HCC)   Diastolic CHF (HCC)   DM (diabetes mellitus), type 2 with complications (HCC)   Essential hypertension   Leg edema   Acute diastolic CHF (congestive heart failure) (HCC)   Blood in stool   OSA (obstructive sleep apnea)   Acute diastolic (congestive) heart failure ([place])   LOS: 2 days

## 2018-09-13 DIAGNOSIS — L03119 Cellulitis of unspecified part of limb: Secondary | ICD-10-CM

## 2018-09-13 LAB — BASIC METABOLIC PANEL
Anion gap: 11 (ref 5–15)
BUN: 51 mg/dL — ABNORMAL HIGH (ref 8–23)
CO2: 29 mmol/L (ref 22–32)
Calcium: 8.3 mg/dL — ABNORMAL LOW (ref 8.9–10.3)
Chloride: 101 mmol/L (ref 98–111)
Creatinine, Ser: 1.22 mg/dL — ABNORMAL HIGH (ref 0.44–1.00)
GFR calc Af Amer: 51 mL/min — ABNORMAL LOW (ref >60)
GFR, EST NON AFRICAN AMERICAN: 44 mL/min — AB (ref >60)
Glucose, Bld: 179 mg/dL — ABNORMAL HIGH (ref 70–99)
POTASSIUM: 3.3 mmol/L — AB (ref 3.5–5.1)
SODIUM: 141 mmol/L (ref 135–145)

## 2018-09-13 LAB — CBC WITH DIFFERENTIAL/PLATELET
Abs Immature Granulocytes: 0.04 10*3/uL (ref 0.00–0.07)
Basophils Absolute: 0 10*3/uL (ref 0.0–0.1)
Basophils Relative: 1 %
Eosinophils Absolute: 0.3 10*3/uL (ref 0.0–0.5)
Eosinophils Relative: 3 %
HCT: 30.4 % — ABNORMAL LOW (ref 36.0–46.0)
Hemoglobin: 9.1 g/dL — ABNORMAL LOW (ref 12.0–15.0)
Immature Granulocytes: 1 %
Lymphocytes Relative: 19 %
Lymphs Abs: 1.4 10*3/uL (ref 0.7–4.0)
MCH: 27.2 pg (ref 26.0–34.0)
MCHC: 29.9 g/dL — AB (ref 30.0–36.0)
MCV: 91 fL (ref 80.0–100.0)
Monocytes Absolute: 0.7 10*3/uL (ref 0.1–1.0)
Monocytes Relative: 10 %
Neutro Abs: 4.9 10*3/uL (ref 1.7–7.7)
Neutrophils Relative %: 66 %
Platelets: 285 10*3/uL (ref 150–400)
RBC: 3.34 MIL/uL — ABNORMAL LOW (ref 3.87–5.11)
RDW: 14.6 % (ref 11.5–15.5)
WBC: 7.4 10*3/uL (ref 4.0–10.5)
nRBC: 0 % (ref 0.0–0.2)

## 2018-09-13 LAB — GLUCOSE, CAPILLARY
Glucose-Capillary: 243 mg/dL — ABNORMAL HIGH (ref 70–99)
Glucose-Capillary: 171 mg/dL — ABNORMAL HIGH (ref 70–99)
Glucose-Capillary: 215 mg/dL — ABNORMAL HIGH (ref 70–99)
Glucose-Capillary: 151 mg/dL — ABNORMAL HIGH (ref 70–99)

## 2018-09-13 LAB — MAGNESIUM: Magnesium: 1.8 mg/dL (ref 1.7–2.4)

## 2018-09-13 MED ORDER — MAGNESIUM SULFATE IN D5W 1-5 GM/100ML-% IV SOLN
1.0000 g | Freq: Once | INTRAVENOUS | Status: AC
  Administered 2018-09-13: 1 g via INTRAVENOUS
  Filled 2018-09-13: qty 100

## 2018-09-13 NOTE — Progress Notes (Signed)
Discussed SNF preferences and placement process more with pt today. She is having her sister some facilities which made bed offers in order to select.  Sharren Bridge, MSW, LCSW Clinical Social Work 09/13/2018 [phone]

## 2018-09-13 NOTE — Care Management Important Message (Signed)
Important Message  Patient Details  Name: REOLA BUCKLES MRN: [id number] Date of Birth: 1946/05/07   Medicare Important Message Given:  Yes    Kerin Salen 09/13/2018, 12:55 [place] Message  Patient Details  Name: SLYVIA LARTIGUE MRN: [id number] Date of Birth: 05/24/1946   Medicare Important Message Given:  Yes    Kerin Salen 09/13/2018, 12:54 PM

## 2018-09-13 NOTE — Progress Notes (Signed)
[facility] H[facility]IST PROGRESS NOTE  VANISSA STRENGTH JHE:[id number] DOB: [birth year] DOA: 09/08/2018 PCP: [name], MD   Narrative: 73 year old female Known history of DM TY 2, HTN  diastolic heart failure OSA noncompliant on CPAP Hypothyroid Normocytic anemia on IV iron Sensorineural hearing loss  Admitted to the hospital 1/11 with shortness of breath bilateral leg pain and weeping-had attempted to treat this with Keflex and adjust diuretics to Bumex as well as put on Unna boots but she is gained about 25 pounds since November Stopped using her BiPAP times many years--previously used to use a cane but has needed a walker recently  She will be here diuresing gently over the next couple of days and we will be managing her electrolyte abnormalities  A & Plan Acute diastolic heart failure Chronic venous insufficiency as well as morbid obesity and on echo has grade 1 diastolic dysfunction Continue diuretics Lasix 80 twice daily IV and follow fluid status Anasarca-baseline weight 113 kg and of 2019, on admission 123 chronic venous insufficiency, hypoalbuminemia etc.-continue Unna boot--will check feet a.m.-appreciate wound care input-Xeroform dressings-metolazone times one 1/16 continue other diuretics as above OSA noncompliant on CPAP- has elevated PA peak pressure  will need appointment on discharge as is noncompliant AKI on admission- creatinine normal 2016 elevated 45/1.66-no M spike on follow-up renal ultrasound normal and likely secondary to cardiorenal syndrome-at this time holding Tenoretic and losartan Diabetes mellitus type 2 A1c 7.1- continue sliding scale coverage-will need discussion about new onset diabetes in the outpatient setting Normocytic anemia, B12 deficiency- B12 low 177 getting injections and also has had IV iron Has had adenomatous polyps and occasional blood with hemorrhoids Newly diagnosed hypothyroidism Continue meds recheck as outpatient Hypokalemia  potassium 40 3 times daily  replace magnesium 400 twice daily Giving an additional 1 g of magnesium today Labs in a.m. Superobesity, BMI 48 Life-threatening  Lovenox, full code, discussed with patient alone-will need further diuresis over several days to ensure negative fluid balance  Verlon Au, MD  [facility] Direct contact: [phone] --Via [place]  --[email or web address]; password [email or web address]  7PM-7AM contact night coverage as above 09/13/2018, 10:50 AM  LOS: 3 days      Interval history/Subjective: Did not sleep well overnight Passing stool without MiraLAX so is happy about that No fever no chills Overall is improved but still has a lot of fluid on board compared to her normal  Objective:  Vitals:  Vitals:   09/12/18 2145 09/13/18 0422  BP: (!) 125/43 (!) 118/58  Pulse: 86 79  Resp: 20 16  Temp: 99.9 F (37.7 C) 98.3 F (36.8 C)  SpO2: 97% 94%    Exam:   EOMI NCAT Thick neck  no icterus no pallor Chest is clear without added sound Unna boots in place Abdomen is obese without any discernible deficit   I have personally reviewed the following:  DATA   Labs:  Potassium 3.3 despite replacement  Magnesium up to 1.8  BUN/creatinine stable  -6.9 L, weight down from 123 to 118 kg  Scheduled Meds: . feeding supplement (PRO-STAT SUGAR FREE 64)  30 mL Oral BID  . furosemide  80 mg Intravenous BID  . insulin aspart  0-5 Units Subcutaneous QHS  . insulin aspart  0-9 Units Subcutaneous TID WC  . levothyroxine  50 mcg Oral Q0600  . magnesium oxide  400 mg Oral BID  . metoprolol tartrate  25 mg Oral BID  . multivitamin with minerals  1 tablet Oral Daily  . pantoprazole  40 mg Oral Daily  . polyethylene glycol  17 g Oral Daily  . simvastatin  20 mg Oral q1800  . sodium chloride flush  3 mL Intravenous Q12H  . vitamin B-12  1,000 mcg Oral Daily   Continuous Infusions: . sodium chloride    . magnesium sulfate 1 - 4 g bolus IVPB      Active Problems:    Diabetic polyneuropathy associated with diabetes mellitus due to underlying condition (HCC)   Diastolic CHF (HCC)   DM (diabetes mellitus), type 2 with complications (HCC)   Essential hypertension   Leg edema   Acute diastolic CHF (congestive heart failure) (HCC)   Blood in stool   OSA (obstructive sleep apnea)   Acute diastolic (congestive) heart failure ([place])   LOS: 3 days

## 2018-09-14 DIAGNOSIS — L03119 Cellulitis of unspecified part of limb: Secondary | ICD-10-CM

## 2018-09-14 DIAGNOSIS — N179 Acute kidney failure, unspecified: Secondary | ICD-10-CM

## 2018-09-14 LAB — CBC WITH DIFFERENTIAL/PLATELET
Abs Immature Granulocytes: 0.04 10*3/uL (ref 0.00–0.07)
BASOS ABS: 0.1 10*3/uL (ref 0.0–0.1)
Basophils Relative: 1 %
EOS PCT: 4 %
Eosinophils Absolute: 0.3 10*3/uL (ref 0.0–0.5)
HCT: 29.4 % — ABNORMAL LOW (ref 36.0–46.0)
Hemoglobin: 8.9 g/dL — ABNORMAL LOW (ref 12.0–15.0)
Immature Granulocytes: 1 %
Lymphocytes Relative: 17 %
Lymphs Abs: 1.2 10*3/uL (ref 0.7–4.0)
MCH: 27.6 pg (ref 26.0–34.0)
MCHC: 30.3 g/dL (ref 30.0–36.0)
MCV: 91 fL (ref 80.0–100.0)
Monocytes Absolute: 0.7 10*3/uL (ref 0.1–1.0)
Monocytes Relative: 10 %
NRBC: 0 % (ref 0.0–0.2)
Neutro Abs: 4.8 10*3/uL (ref 1.7–7.7)
Neutrophils Relative %: 67 %
Platelets: 258 10*3/uL (ref 150–400)
RBC: 3.23 MIL/uL — ABNORMAL LOW (ref 3.87–5.11)
RDW: 14.8 % (ref 11.5–15.5)
WBC: 7.1 10*3/uL (ref 4.0–10.5)

## 2018-09-14 LAB — GLUCOSE, CAPILLARY
GLUCOSE-CAPILLARY: 152 mg/dL — AB (ref 70–99)
Glucose-Capillary: 160 mg/dL — ABNORMAL HIGH (ref 70–99)
Glucose-Capillary: 161 mg/dL — ABNORMAL HIGH (ref 70–99)
Glucose-Capillary: 189 mg/dL — ABNORMAL HIGH (ref 70–99)
Glucose-Capillary: 186 mg/dL — ABNORMAL HIGH (ref 70–99)

## 2018-09-14 LAB — MAGNESIUM: Magnesium: 1.9 mg/dL (ref 1.7–2.4)

## 2018-09-14 LAB — BASIC METABOLIC PANEL
Anion gap: 12 (ref 5–15)
BUN: 52 mg/dL — ABNORMAL HIGH (ref 8–23)
CALCIUM: 8.3 mg/dL — AB (ref 8.9–10.3)
CO2: 31 mmol/L (ref 22–32)
Chloride: 96 mmol/L — ABNORMAL LOW (ref 98–111)
Creatinine, Ser: 1.15 mg/dL — ABNORMAL HIGH (ref 0.44–1.00)
GFR calc non Af Amer: 47 mL/min — ABNORMAL LOW (ref >60)
GFR, EST AFRICAN AMERICAN: 55 mL/min — AB (ref >60)
Glucose, Bld: 180 mg/dL — ABNORMAL HIGH (ref 70–99)
Potassium: 3 mmol/L — ABNORMAL LOW (ref 3.5–5.1)
Sodium: 139 mmol/L (ref 135–145)

## 2018-09-14 MED ORDER — FUROSEMIDE 10 MG/ML IJ SOLN
80.0000 mg | Freq: Three times a day (TID) | INTRAMUSCULAR | Status: DC
  Administered 2018-09-14 – 2018-09-15 (×3): 80 mg via INTRAVENOUS
  Filled 2018-09-14 (×3): qty 8

## 2018-09-14 MED ORDER — MAGNESIUM OXIDE 400 (241.3 MG) MG PO TABS
800.0000 mg | ORAL_TABLET | Freq: Two times a day (BID) | ORAL | Status: DC
  Administered 2018-09-14 – 2018-09-18 (×9): 800 mg via ORAL
  Filled 2018-09-14 (×10): qty 2

## 2018-09-14 MED ORDER — LIDOCAINE 5 % EX PTCH
1.0000 | MEDICATED_PATCH | CUTANEOUS | Status: DC
  Administered 2018-09-14 – 2018-09-16 (×2): 1 via TRANSDERMAL
  Filled 2018-09-14 (×5): qty 1

## 2018-09-14 MED ORDER — POTASSIUM CHLORIDE CRYS ER 20 MEQ PO TBCR
40.0000 meq | EXTENDED_RELEASE_TABLET | Freq: Two times a day (BID) | ORAL | Status: DC
  Administered 2018-09-14 – 2018-09-18 (×9): 40 meq via ORAL
  Filled 2018-09-14 (×9): qty 2

## 2018-09-14 NOTE — Care Management Note (Signed)
Case Management Note  Patient Details  Name: KAELEA GATHRIGHT MRN: [id number] Date of Birth: [birth year]  Subjective/Objective:                    Action/Plan:Plan to discharge to SNF. CSW following.   Expected Discharge Date:                  Expected Discharge Plan:  Skilled Nursing Facility  In-House Referral:  Clinical Social Work  Discharge planning Services  CM Consult  Post Acute Care Choice:    Choice offered to:     DME Arranged:    DME Agency:  NA  HH Arranged:    [place] Agency:     Status of Service:  In process, will continue to follow  If discussed at Long Length of Stay Meetings, dates discussed:    Additional CommentsPurcell Mouton, RN 09/14/2018, 2:32 PM

## 2018-09-14 NOTE — Progress Notes (Signed)
Physical Therapy Treatment Patient Details Name: Megan [name] MRN: [id number] DOB: 12-30-1945 Today's Date: 09/14/2018    History of Present Illness 73 year old female admitted 09/08/2018 presenting with bilateral leg swelling and pain since 06/2018 with skin breaking down and weeping.  Diastolic CHF. PMH: DM, HTN, CHF, obstructive sleep apnea, hypothyroidism, history of lymphedema. Prior to admission walking with walker. Used to walk with cane recently.    PT Comments    Pt ambulated in hallway and fatigued quickly.  Pt then performed LE exercises in recliner.  Pt reports she plans to d/c to SNF for rehab.  Follow Up Recommendations  SNF     Equipment Recommendations  None recommended by PT    Recommendations for Other Services       Precautions / Restrictions Precautions Precautions: Fall    Mobility  Bed Mobility                  Transfers Overall transfer level: Needs assistance Equipment used: Rolling walker (2 wheeled) Transfers: Sit to/from Stand Sit to Stand: Min guard         General transfer comment: min/guard for safety. VCs safety, technique, hand placement.   Ambulation/Gait Ambulation/Gait assistance: Min guard Gait Distance (Feet): 160 Feet Assistive device: Rolling walker (2 wheeled) Gait Pattern/deviations: Step-through pattern;Decreased stride length     General Gait Details: slow, labored cadence, limited by fatigue, distance to tolerance   Stairs             Wheelchair Mobility    Modified Rankin (Stroke Patients Only)       Balance                                            Cognition Arousal/Alertness: Awake/alert Behavior During Therapy: WFL for tasks assessed/performed Overall Cognitive Status: Within Functional Limits for tasks assessed                                        Exercises General Exercises - Lower Extremity Ankle Circles/Pumps: AROM;10  reps;Seated;Both Long Arc Quad: AROM;10 reps;Seated;Both Hip ABduction/ADduction: AROM;10 reps;Supine;Both Hip Flexion/Marching: AROM;10 reps;Seated;Both    General Comments        Pertinent Vitals/Pain Pain Assessment: No/denies pain    Home Living                      Prior Function            PT Goals (current goals can now be found in the care plan section) Progress towards PT goals: Progressing toward goals    Frequency    Min 3X/week      PT Plan Current plan remains appropriate    Co-evaluation              AM-PAC PT "6 Clicks" Mobility   Outcome Measure  Help needed turning from your back to your side while in a flat bed without using bedrails?: A Little Help needed moving from lying on your back to sitting on the side of a flat bed without using bedrails?: A Little Help needed moving to and from a bed to a chair (including a wheelchair)?: A Little Help needed standing up from a chair using your arms (e.g., wheelchair or bedside chair)?: A Little Help  needed to walk in hospital room?: A Little Help needed climbing 3-5 steps with a railing? : A Lot 6 Click Score: 17    End of Session   Activity Tolerance: Patient limited by fatigue Patient left: with call bell/phone within reach;in chair   PT Visit Diagnosis: Unsteadiness on feet (R26.81);Muscle weakness (generalized) (M62.81)     Time: 3888-2800 PT Time Calculation (min) (ACUTE ONLY): 21 min  Charges:  $Gait Training: 8-22 mins                     Carmelia Bake, PT, [place] Office: [phone] Pager: [phone]  Trena Platt 09/14/2018, 4:27 PM

## 2018-09-14 NOTE — Progress Notes (Signed)
[facility] PROGRESS NOTE  Megan [name] [id number]:[id number] DOB: November 10, 1945 DOA: 09/08/2018 PCP: [name], MD   Narrative: 73 year old CF Known history of DM TY 2, HTN  diastolic heart failure OSA noncompliant on CPAP Hypothyroid Normocytic anemia on IV iron Sensorineural hearing loss  Admitted to the hospital 1/11 with shortness of breath bilateral leg pain and weeping-had attempted to treat this with Keflex and adjust diuretics to Bumex as well as put on Unna boots but she is gained about 25 pounds since November Stopped using her BiPAP times many years--previously used to use a cane but has needed a walker recently  She will be here diuresing gently over the next couple of days and we will be managing her electrolyte abnormalities  A & Plan  Acute diastolic heart failure Chronic venous insufficiency as well as morbid obesity and on echo has grade 1 diastolic dysfunction Nearing baseline outpatient weight-transition IV to p.o. Lasix 80 3 times daily, continue metoprolol 25 twice daily (discontinued this admission losartan 100, Tenoretic) Anasarca-baseline weight 113 kg and of 2019, on admission 123 Lower extremity wounds secondary to volume overload/anasarca chronic venous insufficiency, hypoalbuminemia etc.-continue Unna boot--wounds reviewed 1/17-scabbing over and are likely secondary to bursting blebs-appreciate wound care input-Xeroform dressings to continue will need twice weekly with dressings Tuesday and Fridays until healed OSA noncompliant on CPAP- has elevated PA peak pressure  will need appointment on discharge as is noncompliant AKI on admission superimposed on admission on chronic kidney disease stage II creatinine normal 2016-elevated 45/1.66-no M spike on follow-up renal ultrasound normal-on discharge would discontinue Mobic given renal insufficiency Diabetes mellitus type 2 A1c 7.1- continue sliding scale coverage-previously on metformin 500 twice  daily-on discharge switching to glyburide 2.5 twice daily given renal insufficiency Normocytic anemia, B12 deficiency- B12 low 177 getting injections and also has had IV iron Has had adenomatous polyps and occasional blood with hemorrhoids On discharge resume Niferex 150 daily capsule Newly diagnosed hypothyroidism Continue meds recheck as outpatient Hypokalemia Replace with potassium 40 twice daily, increase oral magnesium to 800 twice daily Labs in a.m. Superobesity, BMI 48 Life-threatening Reflux -continue Zocor 20 daily, Zegerid 1 capsule at bedtime on hold for now Bipolar  resume Zyprexa as needed for depression on discharge   Lovenox, full code, discussed with patient alone-will need further diuresis over several days to ensure negative fluid balance  Verlon Au, MD  [facility] --[address]  --[email or web address] 7PM-7AM contact night coverage as above 09/14/2018, 11:05 AM  LOS: 4 days      Interval history/Subjective:  Awake pleasant alert doing better overall no chest pain no fever Did have some nausea and vomiting episode x1 last night but that has since resolved Is passing good regular bowel motion  Objective:  Vitals:  Vitals:   09/13/18 2055 09/14/18 0537  BP: (!) 128/53 (!) 120/39  Pulse: 83 79  Resp: 18 18  Temp: 98.4 F (36.9 C) 98.7 F (37.1 C)  SpO2: 93% 90%    Exam:  Thick neck no distress EOMI NCAT Chest is clear without added sound Abdomen soft nontender Unna boots removed blistered and scarred areas posterior laterally with 2 lesions on the back of cough in the lower limbs bilaterally that have more confluence Neurologically intact Psychiatric euthymic   I have personally reviewed the following:  DATA   Labs:  Potassium 3.0, magnesium now 1.9  BUN/creatinine 52/1.1 at its [place] on admission was 61/1.7  -7.03 L down to 115 kg down from 123 on admission  baseline is 113 kg  Scheduled Meds: . feeding supplement (PRO-STAT  SUGAR FREE 64)  30 mL Oral BID  . furosemide  80 mg Intravenous TID PC  . insulin aspart  0-5 Units Subcutaneous QHS  . insulin aspart  0-9 Units Subcutaneous TID WC  . levothyroxine  50 mcg Oral Q0600  . lidocaine  1 patch Transdermal Q24H  . magnesium oxide  800 mg Oral BID  . metoprolol tartrate  25 mg Oral BID  . multivitamin with minerals  1 tablet Oral Daily  . pantoprazole  40 mg Oral Daily  . polyethylene glycol  17 g Oral Daily  . potassium chloride  40 mEq Oral BID  . simvastatin  20 mg Oral q1800  . sodium chloride flush  3 mL Intravenous Q12H  . vitamin B-12  1,000 mcg Oral Daily   Continuous Infusions: . sodium chloride      Active Problems:   Diabetic polyneuropathy associated with diabetes mellitus due to underlying condition (HCC)   Diastolic CHF (HCC)   DM (diabetes mellitus), type 2 with complications (HCC)   Essential hypertension   Leg edema   Acute diastolic CHF (congestive heart failure) (HCC)   Blood in stool   OSA (obstructive sleep apnea)   Acute diastolic (congestive) heart failure ([place])   LOS: 4 days

## 2018-09-15 LAB — RENAL FUNCTION PANEL
Albumin: 3 g/dL — ABNORMAL LOW (ref 3.5–5.0)
Anion gap: 11 (ref 5–15)
BUN: 57 mg/dL — ABNORMAL HIGH (ref 8–23)
CO2: 33 mmol/L — ABNORMAL HIGH (ref 22–32)
Calcium: 8 mg/dL — ABNORMAL LOW (ref 8.9–10.3)
Chloride: 94 mmol/L — ABNORMAL LOW (ref 98–111)
Creatinine, Ser: 1.4 mg/dL — ABNORMAL HIGH (ref 0.44–1.00)
GFR calc Af Amer: 43 mL/min — ABNORMAL LOW (ref >60)
GFR calc non Af Amer: 37 mL/min — ABNORMAL LOW (ref >60)
Glucose, Bld: 182 mg/dL — ABNORMAL HIGH (ref 70–99)
Phosphorus: 4.1 mg/dL (ref 2.5–4.6)
Potassium: 3.2 mmol/L — ABNORMAL LOW (ref 3.5–5.1)
Sodium: 138 mmol/L (ref 135–145)

## 2018-09-15 LAB — GLUCOSE, CAPILLARY
GLUCOSE-CAPILLARY: 166 mg/dL — AB (ref 70–99)
Glucose-Capillary: 161 mg/dL — ABNORMAL HIGH (ref 70–99)
Glucose-Capillary: 209 mg/dL — ABNORMAL HIGH (ref 70–99)
Glucose-Capillary: 190 mg/dL — ABNORMAL HIGH (ref 70–99)

## 2018-09-15 LAB — MAGNESIUM: MAGNESIUM: 1.9 mg/dL (ref 1.7–2.4)

## 2018-09-15 MED ORDER — DIPHENHYDRAMINE-ZINC ACETATE 2-0.1 % EX CREA
TOPICAL_CREAM | Freq: Three times a day (TID) | CUTANEOUS | Status: DC | PRN
  Administered 2018-09-15: 21:00:00 via TOPICAL
  Filled 2018-09-15: qty 28

## 2018-09-15 MED ORDER — FUROSEMIDE 10 MG/ML IJ SOLN
80.0000 mg | Freq: Two times a day (BID) | INTRAMUSCULAR | Status: DC
  Administered 2018-09-15: 80 mg via INTRAVENOUS
  Filled 2018-09-15: qty 8

## 2018-09-15 NOTE — Progress Notes (Signed)
[facility] PROGRESS NOTE  Megan [name] URK:[id number] DOB: 05/16/1946 DOA: 09/08/2018 PCP: [name], MD   Narrative: 73 year old CF Known history of DM TY 2, HTN  diastolic heart failure OSA noncompliant on CPAP Hypothyroid Normocytic anemia on IV iron Sensorineural hearing loss  Admitted to the hospital 1/11 with shortness of breath bilateral leg pain and weeping-had attempted to treat this with Keflex and adjust diuretics to Bumex as well as put on Unna boots but she is gained about 25 pounds since November Stopped using her BiPAP times many years--previously used to use a cane but has needed a walker recently  She will be here diuresing gently over the next couple of days and we will be managing her electrolyte abnormalities  A & Plan  Acute diastolic heart failure Chronic venous insufficiency as well as morbid obesity and on echo has grade 1 diastolic dysfunction Nearing baseline outpatient weight-continue for now IV Lasix twice daily 80  continue metoprolol 25 twice daily (discontinued this admission losartan 100, Tenoretic) Anasarca-baseline weight 113 kg , on admission 123 Lower extremity wounds secondary to volume overload/anasarca chronic venous insufficiency, hypoalbuminemia etc.-continue Unna boot--wounds reviewed 1/17-scabbing over and are likely secondary to bursting blebs-appreciate wound care input-Xeroform dressings to continue will need twice weekly with dressings Monday and Wednesday Next check will be 1/20-slightly painful at this time we will reassess if needed and home wound nurse look at the wounds to give recommendations OSA noncompliant on CPAP- has elevated PA peak pressure  will need appointment on discharge as is noncompliant AKI on admission superimposed on admission on chronic kidney disease stage II creatinine normal 2016-elevated 45/1.66-no M spike on follow-up renal ultrasound normal-Mobic discontinued this admission Diabetes mellitus  type 2 A1c 7.1- continue sliding scale coverage-previously on metformin 500 twice daily-on discharge switching to glyburide 2.5 twice daily given renal insufficiency Normocytic anemia, B12 deficiency- B12 low 177 getting injections and also has had IV iron Has had adenomatous polyps and occasional blood with hemorrhoids On discharge resume Niferex 150 daily capsule Newly diagnosed hypothyroidism Continue meds recheck as outpatient Hypokalemia Replace with potassium 40 twice daily, increase oral magnesium to 800 twice daily Superobesity, BMI 48 Life-threatening Reflux -continue Zocor 20 daily, Zegerid 1 capsule at bedtime on hold for now Bipolar  resume Zyprexa as needed for depression on discharge   Lovenox, full code, discussed with patient alone-will need further diuresis over several days to ensure negative fluid balance  Verlon Au, MD  [facility] --[place] [employer] [place]  --[email or web address] 7PM-7AM contact night coverage as above 09/15/2018, 11:35 AM  LOS: 5 days      Interval history/Subjective:  Overall fair sitting in the chair no distress eating breakfast Some pain in the right lower extremity area feels "raw" no chest pain no fever  Objective:  Vitals:  Vitals:   09/14/18 2012 09/15/18 0524  BP: (!) 140/48 (!) 129/54  Pulse: 78 75  Resp: 18 20  Temp: 99.3 F (37.4 C) 98.8 F (37.1 C)  SpO2: 95% 90%    Exam:  Thick neck no distress EOMI NCAT Chest is clear and no added sound Abdomen soft nontender Unna boots on legs and not examined Neurologically intact Psychiatric euthymic   I have personally reviewed the following:  DATA   Labs:  Potassium 3.2, magnesium now 1.9  BUN/creatinine 52/1.4 at its [place] on admission was 61/1.7  Stabilized at -6.9 weight remains at 115 goal weight is 113  Scheduled Meds: . feeding supplement (PRO-STAT SUGAR FREE 64)  30 mL Oral BID  . furosemide  80 mg Intravenous BID  . insulin aspart  0-5 Units Subcutaneous  QHS  . insulin aspart  0-9 Units Subcutaneous TID WC  . levothyroxine  50 mcg Oral Q0600  . lidocaine  1 patch Transdermal Q24H  . magnesium oxide  800 mg Oral BID  . metoprolol tartrate  25 mg Oral BID  . multivitamin with minerals  1 tablet Oral Daily  . pantoprazole  40 mg Oral Daily  . polyethylene glycol  17 g Oral Daily  . potassium chloride  40 mEq Oral BID  . simvastatin  20 mg Oral q1800  . sodium chloride flush  3 mL Intravenous Q12H  . vitamin B-12  1,000 mcg Oral Daily   Continuous Infusions: . sodium chloride      Active Problems:   Diabetic polyneuropathy associated with diabetes mellitus due to underlying condition (HCC)   Diastolic CHF (HCC)   DM (diabetes mellitus), type 2 with complications (HCC)   Essential hypertension   Leg edema   Acute diastolic CHF (congestive heart failure) (HCC)   Blood in stool   OSA (obstructive sleep apnea)   Acute diastolic (congestive) heart failure (HCC)   Cellulitis of lower extremity   Acute renal failure ([place])   LOS: 5 days

## 2018-09-16 LAB — GLUCOSE, CAPILLARY
Glucose-Capillary: 160 mg/dL — ABNORMAL HIGH (ref 70–99)
Glucose-Capillary: 252 mg/dL — ABNORMAL HIGH (ref 70–99)
Glucose-Capillary: 160 mg/dL — ABNORMAL HIGH (ref 70–99)
Glucose-Capillary: 169 mg/dL — ABNORMAL HIGH (ref 70–99)

## 2018-09-16 LAB — RENAL FUNCTION PANEL
Albumin: 2.9 g/dL — ABNORMAL LOW (ref 3.5–5.0)
Anion gap: 10 (ref 5–15)
BUN: 55 mg/dL — ABNORMAL HIGH (ref 8–23)
CALCIUM: 8.3 mg/dL — AB (ref 8.9–10.3)
CO2: 34 mmol/L — ABNORMAL HIGH (ref 22–32)
CREATININE: 1.19 mg/dL — AB (ref 0.44–1.00)
Chloride: 96 mmol/L — ABNORMAL LOW (ref 98–111)
GFR calc Af Amer: 53 mL/min — ABNORMAL LOW (ref >60)
GFR calc non Af Amer: 46 mL/min — ABNORMAL LOW (ref >60)
Glucose, Bld: 177 mg/dL — ABNORMAL HIGH (ref 70–99)
Phosphorus: 3.5 mg/dL (ref 2.5–4.6)
Potassium: 3.5 mmol/L (ref 3.5–5.1)
SODIUM: 140 mmol/L (ref 135–145)

## 2018-09-16 MED ORDER — FUROSEMIDE 40 MG PO TABS
100.0000 mg | ORAL_TABLET | Freq: Two times a day (BID) | ORAL | Status: DC
  Administered 2018-09-16 – 2018-09-18 (×5): 100 mg via ORAL
  Filled 2018-09-16 (×5): qty 1

## 2018-09-16 MED ORDER — POLYVINYL ALCOHOL 1.4 % OP SOLN
1.0000 [drp] | OPHTHALMIC | Status: DC | PRN
  Administered 2018-09-16: 1 [drp] via OPHTHALMIC
  Filled 2018-09-16: qty 15

## 2018-09-16 NOTE — Progress Notes (Signed)
[facility] PROGRESS NOTE  Megan [name] TML:[id number] DOB: 04/13/1946 DOA: 09/08/2018 PCP: [name], MD   Narrative: 73 year old CF Known history of DM TY 2, HTN  diastolic heart failure OSA noncompliant on CPAP Hypothyroid Normocytic anemia on IV iron Sensorineural hearing loss  Admitted to the hospital 1/11 with shortness of breath bilateral leg pain and weeping-had attempted to treat this with Keflex and adjust diuretics to Bumex as well as put on Unna boots but she is gained about 25 pounds since November Stopped using her BiPAP times many years--previously used to use a cane but has needed a walker recently  Continues to diurese with some improvement although low-grade chills and wound maceration noted when last examined  A & Plan  Acute diastolic heart failure Chronic venous insufficiency as well as morbid obesity and on echo has grade 1 diastolic dysfunction May be some fluid and dietary indiscretion as fluid status slightly is up and positive (discontinued this admission losartan 100, Tenoretic) Changing IV Lasix-->Lasix 100 twice po daily orally and monitor fluid output overnight Anasarca-baseline weight 113 kg , on admission 123 Lower extremity wounds secondary to volume overload/anasarca chronic venous insufficiency, hypoalbuminemia etc.-continue Unna boot--wounds reviewed 1/17--given some chills have asked wound care nurse to look back at the feet.today and Comment on if Xeroform or other dressings are needed-she is nearing discharge OSA noncompliant on CPAP- has elevated PA peak pressure  will need appointment on discharge as is noncompliant AKI on admission superimposed on admission on chronic kidney disease stage II creatinine normal 2016-elevated 45/1.66 on admission but has trended down-- renal ultrasound normal-Mobic discontinued this admission Diabetes mellitus type 2 A1c 7.1- continue sliding scale coverage-previously on metformin 500 twice  daily-we will resume the same depending on creatinine Normocytic anemia, B12 deficiency- B12 low 177 getting injections and also has had IV iron Has had adenomatous polyps and occasional blood with hemorrhoids On discharge resume Niferex 150 daily capsule Newly diagnosed hypothyroidism Continue meds recheck as outpatient Hypokalemia Labs are stable continue potassium 40 twice daily,  oral magnesium 800 twice daily Superobesity, BMI 48 Life-threatening Reflux continue Zocor 20 daily, Zegerid 1 capsule at bedtime on hold for now Bipolar  resume Zyprexa as needed for depression on discharge   Lovenox, full code, discussed with patient alone-will need further diuresis over several days to ensure negative fluid balance  Verlon Au, MD  [facility] --[place] [employer] [facility] [place]  --[email or web address] 7PM-7AM contact night coverage as above 09/16/2018, 11:28 AM  LOS: 6 days      Interval history/Subjective:  Did not sleep well last night-had some cold sweats-concerned about lower extremities being infected About to get up and have breakfast Overall she appears about the same as previous  Objective:  Vitals:  Vitals:   09/15/18 2119 09/16/18 0417  BP: (!) 126/48 (!) 141/66  Pulse: 77 77  Resp: 19 18  Temp: 98.8 F (37.1 C) 98.9 F (37.2 C)  SpO2: 96% 91%    Exam:  Thick neck no distress EOMI NCAT Chest is clear and no added sound no JVD no murmur-had some PVCs on the monitor but feel can discontinue telemetry Abdomen soft nontender Unna boots on legs and calves are not examined today Neurologically intact Psychiatric euthymic   I have personally reviewed the following:  DATA   Labs:  Potassium 3.5 magnesium not checked today  BUN/creatinine 55/1.1 at its [place] on admission was 61/1.7  Fluid status [-] -8.6 L weight down to 113 with goal weight is 113  Scheduled Meds: . feeding supplement (PRO-STAT SUGAR FREE 64)  30 mL Oral BID  . furosemide  100 mg Oral BID  .  insulin aspart  0-5 Units Subcutaneous QHS  . insulin aspart  0-9 Units Subcutaneous TID WC  . levothyroxine  50 mcg Oral Q0600  . lidocaine  1 patch Transdermal Q24H  . magnesium oxide  800 mg Oral BID  . metoprolol tartrate  25 mg Oral BID  . multivitamin with minerals  1 tablet Oral Daily  . pantoprazole  40 mg Oral Daily  . polyethylene glycol  17 g Oral Daily  . potassium chloride  40 mEq Oral BID  . simvastatin  20 mg Oral q1800  . sodium chloride flush  3 mL Intravenous Q12H  . vitamin B-12  1,000 mcg Oral Daily   Continuous Infusions: . sodium chloride      Active Problems:   Diabetic polyneuropathy associated with diabetes mellitus due to underlying condition (HCC)   Diastolic CHF (HCC)   DM (diabetes mellitus), type 2 with complications (HCC)   Essential hypertension   Leg edema   Acute diastolic CHF (congestive heart failure) (HCC)   Blood in stool   OSA (obstructive sleep apnea)   Acute diastolic (congestive) heart failure (HCC)   Cellulitis of lower extremity   Acute renal failure ([place])   LOS: 6 days

## 2018-09-17 LAB — RENAL FUNCTION PANEL
Albumin: 3.1 g/dL — ABNORMAL LOW (ref 3.5–5.0)
Anion gap: 11 (ref 5–15)
BUN: 57 mg/dL — ABNORMAL HIGH (ref 8–23)
CHLORIDE: 94 mmol/L — AB (ref 98–111)
CO2: 35 mmol/L — ABNORMAL HIGH (ref 22–32)
Calcium: 8.4 mg/dL — ABNORMAL LOW (ref 8.9–10.3)
Creatinine, Ser: 1.22 mg/dL — ABNORMAL HIGH (ref 0.44–1.00)
GFR calc Af Amer: 51 mL/min — ABNORMAL LOW (ref >60)
GFR calc non Af Amer: 44 mL/min — ABNORMAL LOW (ref >60)
Glucose, Bld: 204 mg/dL — ABNORMAL HIGH (ref 70–99)
Phosphorus: 3.6 mg/dL (ref 2.5–4.6)
Potassium: 3.7 mmol/L (ref 3.5–5.1)
Sodium: 140 mmol/L (ref 135–145)

## 2018-09-17 LAB — GLUCOSE, CAPILLARY
GLUCOSE-CAPILLARY: 160 mg/dL — AB (ref 70–99)
Glucose-Capillary: 180 mg/dL — ABNORMAL HIGH (ref 70–99)
Glucose-Capillary: 162 mg/dL — ABNORMAL HIGH (ref 70–99)

## 2018-09-17 MED ORDER — FUROSEMIDE 20 MG PO TABS: 100.0000 mg | ORAL_TABLET | Freq: Two times a day (BID) | ORAL | Status: DC

## 2018-09-17 MED ORDER — OLANZAPINE 2.5 MG PO TABS: 2.5000 mg | ORAL_TABLET | ORAL | 0 refills | Status: DC | PRN

## 2018-09-17 MED ORDER — METOPROLOL TARTRATE 25 MG PO TABS: 25.0000 mg | ORAL_TABLET | Freq: Two times a day (BID) | ORAL | Status: DC

## 2018-09-17 MED ORDER — MAGNESIUM OXIDE 400 (241.3 MG) MG PO TABS: 800.0000 mg | ORAL_TABLET | Freq: Two times a day (BID) | ORAL | Status: DC

## 2018-09-17 MED ORDER — POLYETHYLENE GLYCOL 3350 17 G PO PACK: 17.0000 g | PACK | Freq: Every day | ORAL | 0 refills | Status: DC

## 2018-09-17 MED ORDER — POLYVINYL ALCOHOL 1.4 % OP SOLN: 1.0000 [drp] | OPHTHALMIC | 0 refills | Status: DC | PRN

## 2018-09-17 MED ORDER — POTASSIUM CHLORIDE CRYS ER 20 MEQ PO TBCR: 40.0000 meq | EXTENDED_RELEASE_TABLET | Freq: Two times a day (BID) | ORAL | Status: DC

## 2018-09-17 NOTE — Progress Notes (Signed)
Physical Therapy Treatment Patient Details Name: Megan [name] MRN: [id number] DOB: [birth year] Today's Date: 09/17/2018    History of Present Illness 73 year old female admitted 09/08/2018 presenting with bilateral leg swelling and pain since 06/2018 with skin breaking down and weeping.  Diastolic CHF. PMH: DM, HTN, CHF, obstructive sleep apnea, hypothyroidism, history of lymphedema. Prior to admission walking with walker. Used to walk with cane recently.    PT Comments    Pt is motivated and cooperative with PT; progressing toward goals, will benefit from SNF for continued PT to maximize independence   Follow Up Recommendations  SNF     Equipment Recommendations  None recommended by PT    Recommendations for Other Services       Precautions / Restrictions Precautions Precautions: Fall Restrictions Weight Bearing Restrictions: No    Mobility  Bed Mobility               General bed mobility comments: in chair on arrival  Transfers Overall transfer level: Needs assistance Equipment used: Rolling walker (2 wheeled) Transfers: Sit to/from Stand Sit to Stand: Min guard Stand pivot transfers: Min guard       General transfer comment: min/guard for safety. VCs safety, technique, hand placement.   Ambulation/Gait Ambulation/Gait assistance: Min guard Gait Distance (Feet): 155 Feet Assistive device: Rolling walker (2 wheeled) Gait Pattern/deviations: Step-through pattern;Decreased stride length Gait velocity: decreased   General Gait Details: slow but steady gait, 1 standing rest d/t dyspnea, reliant on RW for stability   Stairs             Wheelchair Mobility    Modified Rankin (Stroke Patients Only)       Balance   Sitting-balance support: No upper extremity supported;Feet supported Sitting balance-Leahy Scale: Good     Standing balance support: Bilateral upper extremity supported Standing balance-Leahy Scale: Poor Standing balance  comment: reliant on UEs                             Cognition Arousal/Alertness: Awake/alert Behavior During Therapy: WFL for tasks assessed/performed Overall Cognitive Status: Within Functional Limits for tasks assessed                                        Exercises General Exercises - Lower Extremity Long Arc Quad: AROM;10 reps;Seated;Both Hip ABduction/ADduction: AROM;10 reps;Supine;Both Hip Flexion/Marching: AROM;10 reps;Seated;Both Toe Raises: AROM;Both;10 reps;Seated Heel Raises: AROM;Both;10 reps;Seated    General Comments        Pertinent Vitals/Pain Pain Assessment: No/denies pain    Home Living                      Prior Function            PT Goals (current goals can now be found in the care plan section) Acute Rehab PT Goals Patient Stated Goal: get better, return home when safe to do so without falling PT Goal Formulation: With patient Time For Goal Achievement: 09/23/18 Potential to Achieve Goals: Good Progress towards PT goals: Progressing toward goals    Frequency    Min 3X/week      PT Plan Current plan remains appropriate    Co-evaluation              AM-PAC PT "6 Clicks" Mobility   Outcome Measure  Help needed turning from  your back to your side while in a flat bed without using bedrails?: A Little Help needed moving from lying on your back to sitting on the side of a flat bed without using bedrails?: A Little Help needed moving to and from a bed to a chair (including a wheelchair)?: A Little Help needed standing up from a chair using your arms (e.g., wheelchair or bedside chair)?: A Little Help needed to walk in hospital room?: A Little Help needed climbing 3-5 steps with a railing? : A Lot 6 Click Score: 17    End of Session Equipment Utilized During Treatment: Gait belt Activity Tolerance: Patient tolerated treatment well Patient left: with call bell/phone within reach;Other  (comment);with family/visitor present(on BSC) Nurse Communication: Mobility status PT Visit Diagnosis: Unsteadiness on feet (R26.81);Muscle weakness (generalized) (M62.81)     Time: 6153-7943 PT Time Calculation (min) (ACUTE ONLY): 18 min  Charges:  $Gait Training: 8-22 mins                     Kenyon Ana, PT  Pager: [phone] Acute Rehab Dept [facility]): [phone]   09/17/2018    [facility] 09/17/2018, 2:54 PM

## 2018-09-17 NOTE — Consult Note (Signed)
[place] nurse contacted bedside nurse to determine when Unna's boots would be removed/replaced. She reports Dr. Verlon Au removed and assessed LE.  Unna's boots replaced after his assessment.  He has written DC orders for Unna's boots 2x wk Monday and Fridays. Xeroform to the wounds.   WOC did not see patient for this reason.   [place], [place], [place]

## 2018-09-17 NOTE — Discharge Summary (Signed)
Physician Discharge Summary  Megan [name] NAT:[id number] DOB: [birth year] DOA: 09/08/2018  PCP: [name], MD  Admit date: 09/08/2018 Discharge date: 09/17/2018  Time spent: 45 minutes  Recommendations for Outpatient Follow-up:  1. Will need semi-aggressive diuresis with Lasix 100 twice daily note medication changes and discontinuation varied blood pressure-substituted metoprolol 25 this admission at this time medications losartan Tenoretic this admission 2. As outpatient needs magnesium Chem-7 1 week and replacing magnesium and potassium this hospital stay 3. Needs education regarding fluid restriction salt control and heart healthy diet 4. Consider as an outpatient once returns to community split-level CPAP study 5. Needs TSH in 1 month 6. Continue dressing changes with Xeroform underneath Unna boots to be changed on Mondays and Fridays  Discharge Diagnoses:  Active Problems:   Diabetic polyneuropathy associated with diabetes mellitus due to underlying condition (HCC)   Diastolic CHF (HCC)   DM (diabetes mellitus), type 2 with complications [facility])   Essential hypertension   Leg edema   Acute diastolic CHF (congestive heart failure) (HCC)   Blood in stool   OSA (obstructive sleep apnea)   Acute diastolic (congestive) heart failure (HCC)   Cellulitis of lower extremity   Acute renal failure [facility])   Discharge Condition: Improved  Diet recommendation: Heart healthy low-salt  Filed Weights   09/15/18 0524 09/16/18 0526 09/17/18 4270  Weight: 115.6 kg 113.3 kg 114.7 kg    History of present illness:  74 year old CF Known history of DM TY 2, HTN  diastolic heart failure OSA noncompliant on CPAP Hypothyroid Normocytic anemia on IV iron Sensorineural hearing loss  Admitted to the hospital 1/11 with shortness of breath bilateral leg pain and weeping-had attempted to treat this with Keflex and adjust diuretics to Bumex as well as put on Unna boots but she is gained  about 25 pounds since November Stopped using her BiPAP times many years--previously used to use a cane but has needed a walker recently  Continues to diurese with some improvement although low-grade chills and wound maceration noted when last examined  Hospital Course:  Acute diastolic heart failure Chronic venous insufficiency as well as morbid obesity and on echo has grade 1 diastolic dysfunction (discontinued this admission losartan 100, Tenoretic) Changing IV Lasix-->Lasix 100 ii/day on discharge Reached goal baseline weight close to 113 kg was 114 on discharge and was -8.3 L [-] Anasarca-baseline weight 113 kg , on admission 123 Lower extremity wounds secondary to volume overload/anasarca Wound examined on discharge would continue Unna boots changing on Monday and Friday with Xeroform dressings OSA noncompliant on CPAP- has elevated PA peak pressure  will need appointment on discharge as is noncompliant AKI on admission superimposed on admission on chronic kidney disease stage II creatinine normal 2016-elevated 45/1.66 on admission but has trended down-- renal ultrasound normal-Mobic discontinued this admission On discharge BUN/creatinine was 57/1.2 Diabetes mellitus type 2 A1c 7.1- continue sliding scale coverage-previously on metformin 500 twice daily resumed on discharge Normocytic anemia, B12 deficiency- B12 low 177 getting injections and also has had IV iron Has had adenomatous polyps and occasional blood with hemorrhoids On discharge resume Niferex 150 daily capsule Newly diagnosed hypothyroidism Continue meds recheck as outpatient Hypokalemia Labs are stable continue potassium 40 twice daily,  oral magnesium 800 twice daily- Does need repeat check within a week Superobesity, BMI 48 Life-threatening Reflux continue Zocor 20 daily, Zegerid 1 capsule at bedtime on hold for now Bipolar  resume Zyprexa as needed for depression on discharge  Procedures:  Echocardiogram  1/12  Study Conclusions  - Left ventricle: The cavity size was normal. There was moderate   concentric hypertrophy. Systolic function was normal. The   estimated ejection fraction was in the range of 55% to 60%. There   was dynamic obstruction at rest, with a peak velocity of 157   cm/sec and a peak gradient of 10 mm Hg. Wall motion was normal;   there were no regional wall motion abnormalities. Doppler   parameters are consistent with abnormal left ventricular   relaxation (grade 1 diastolic dysfunction). Doppler parameters   are consistent with indeterminate ventricular filling pressure. - Aortic valve: Transvalvular velocity was within the normal range.   There was no stenosis. There was no regurgitation. - Mitral valve: Transvalvular velocity was within the normal range.   There was no evidence for stenosis. There was no regurgitation. - Right ventricle: The cavity size was normal. Wall thickness was   normal. Systolic function was normal. - Atrial septum: No defect or patent foramen ovale was identified   by color flow Doppler. - Tricuspid valve: There was trivial regurgitation. - Pulmonary arteries: Systolic pressure was mildly increased. PA   peak pressure: 40 mm Hg (S). - Pericardium, extracardiac: A small pericardial effusion was   identified. Features were not consistent with tamponade   physiology.  Discharge Exam: Vitals:   09/16/18 2132 09/17/18 0446  BP: (!) 141/53 (!) 125/49  Pulse: 80 77  Resp: 18 18  Temp: 99.2 F (37.3 C) 98.2 F (36.8 C)  SpO2: 99% 92%    General: Awake alert pleasant slept well overnight no distress No fever no chills tolerating diet Cardiovascular: S1-S2 no murmur rub or gallop NSR Respiratory: Clinically clear no added sound Wounds examined has macerated skin with scab but no open wounds lesions or purulence on lower extremities after removal of Unna boots  Discharge Instructions   Discharge Instructions    Diet - low sodium  heart healthy   Complete by:  As directed    Increase activity slowly   Complete by:  As directed      Allergies as of 09/17/2018      Reactions   Aspirin Anaphylaxis   Bee Pollen    Extreme swelling due to bee stings   Codeine Nausea And Vomiting   Fish Oil Diarrhea   Lisinopril Cough   Sulfa Antibiotics Nausea And Vomiting   Sulfasalazine Nausea And Vomiting   Vicodin [hydrocodone-acetaminophen] Nausea And Vomiting   Camphor Dermatitis   Camphor Dermatitis   Penicillins Rash   DID THE REACTION INVOLVE: Swelling of the face/tongue/throat, SOB, or low BP? Yes Sudden or severe rash/hives, skin peeling, or the inside of the mouth or nose? Unknown Did it require medical treatment? Unknown When did it last happen? If all above answers are "NO", may proceed with cephalosporin use.      Medication List    STOP taking these medications   atenolol-chlorthalidone 50-25 MG tablet Commonly known as:  TENORETIC   bumetanide 1 MG tablet Commonly known as:  BUMEX   cephALEXin 500 MG capsule Commonly known as:  KEFLEX   ICAPS AREDS FORMULA PO   losartan 100 MG tablet Commonly known as:  COZAAR   meloxicam 7.5 MG tablet Commonly known as:  MOBIC   NONFORMULARY OR COMPOUNDED ITEM   ONE TOUCH ULTRA TEST test strip Generic drug:  glucose blood   POTASSIUM CHLORIDE PO     TAKE these medications   furosemide 20 MG tablet Commonly known as:  LASIX  Take 5 tablets (100 mg total) by mouth 2 (two) times daily.   iron polysaccharides 150 MG capsule Commonly known as:  NIFEREX Take 1 capsule (150 mg total) by mouth daily.   levothyroxine 50 MCG tablet Commonly known as:  SYNTHROID, LEVOTHROID Take 50 mcg by mouth daily before breakfast.   magnesium oxide 400 (241.3 Mg) MG tablet Commonly known as:  MAG-OX Take 2 tablets (800 mg total) by mouth 2 (two) times daily.   metFORMIN 500 MG tablet Commonly known as:  GLUCOPHAGE Take 500 mg by mouth 2 (two) times daily with  a meal.   metoprolol tartrate 25 MG tablet Commonly known as:  LOPRESSOR Take 1 tablet (25 mg total) by mouth 2 (two) times daily.   multivitamin with minerals Tabs tablet Take 1 tablet by mouth daily.   OLANZapine 2.5 MG tablet Commonly known as:  ZYPREXA Take 1 tablet (2.5 mg total) by mouth as needed (sad).   Omeprazole-Sodium Bicarbonate 20-1100 MG Caps capsule Commonly known as:  ZEGERID Take 1 capsule by mouth at bedtime.   pantoprazole 40 MG tablet Commonly known as:  PROTONIX TAKE 1 TABLET BY MOUTH  DAILY BEFORE BREAKFAST What changed:  See the new instructions.   polyethylene glycol packet Commonly known as:  MIRALAX / GLYCOLAX Take 17 g by mouth daily.   polyvinyl alcohol 1.4 % ophthalmic solution Commonly known as:  LIQUIFILM TEARS Place 1 drop into both eyes as needed for dry eyes.   potassium chloride SA 20 MEQ tablet Commonly known as:  K-DUR,KLOR-CON Take 2 tablets (40 mEq total) by mouth 2 (two) times daily.   simvastatin 20 MG tablet Commonly known as:  ZOCOR Take 20 mg by mouth daily at 6 PM.   TYLENOL ARTHRITIS PAIN PO Take 650 mg by mouth as needed (pain).   VITAMIN E PO Take 1 tablet by mouth daily.      Allergies  Allergen Reactions  . Aspirin Anaphylaxis  . Bee Pollen     Extreme swelling due to bee stings  . Codeine Nausea And Vomiting  . Fish Oil Diarrhea  . Lisinopril Cough  . Sulfa Antibiotics Nausea And Vomiting  . Sulfasalazine Nausea And Vomiting  . Vicodin [Hydrocodone-Acetaminophen] Nausea And Vomiting  . Camphor Dermatitis  . Camphor Dermatitis  . Penicillins Rash    DID THE REACTION INVOLVE: Swelling of the face/tongue/throat, SOB, or low BP? Yes Sudden or severe rash/hives, skin peeling, or the inside of the mouth or nose? Unknown Did it require medical treatment? Unknown When did it last happen? If all above answers are "NO", may proceed with cephalosporin use.       The results of significant diagnostics  from this hospitalization (including imaging, microbiology, ancillary and laboratory) are listed below for reference.    Significant Diagnostic Studies: Dg Chest 2 View  Result Date: 09/08/2018 CLINICAL DATA:  Shortness of breath. Lower extremity swelling. EXAM: CHEST - 2 VIEW COMPARISON:  07/09/2014 FINDINGS: Low lung volumes are noted. Heart size is within normal limits allowing for low lung volumes. No evidence of pulmonary infiltrate or edema. No evidence of pleural effusion. A moderate size hiatal hernia is noted. IMPRESSION: 1. Low lung volumes. No active lung disease. 2. Moderate hiatal hernia. Electronically Signed   By: Earle Gell M.D.   On: 09/08/2018 16:49   US Renal  Result Date: 09/09/2018 CLINICAL DATA:  73 year old with acute renal failure. Current history of hypertension. EXAM: RENAL / URINARY TRACT ULTRASOUND COMPLETE COMPARISON:  CT abdomen  and pelvis 11/24/2015 and earlier. No prior ultrasound. FINDINGS: Right Kidney: Renal measurements: Approximately 8.2 x 5.3 x 4.6 cm = volume: 103 mL . No hydronephrosis. Mild cortical thinning diffusely. No shadowing calculi. Normal parenchymal echotexture. No focal parenchymal abnormality. Left Kidney: Renal measurements: Approximately 8.5 x 4.7 x 3.3 cm = volume: 69 mL. No hydronephrosis. Mild cortical thinning diffusely. No shadowing calculi. Normal parenchymal echotexture. No focal parenchymal abnormality. Bladder: Decompressed and normal in appearance. IMPRESSION: 1. BILATERAL renal atrophy with mild diffuse cortical thinning. 2. No evidence of hydronephrosis to suggest obstruction. No focal parenchymal abnormality involving either kidney. Electronically Signed   By: Evangeline Dakin M.D.   On: 09/09/2018 15:59   Vas [name] Lower Extremity Venous (dvt)  Result Date: 09/10/2018  Lower Venous Study Indications: Edema.  Limitations: Body habitus, poor ultrasound/tissue interface and open wound. Performing Technologist: Oliver Hum RVT   Examination Guidelines: A complete evaluation includes B-mode imaging, spectral Doppler, color Doppler, and power Doppler as needed of all accessible portions of each vessel. Bilateral testing is considered an integral part of a complete examination. Limited examinations for reoccurring indications may be performed as noted.  Right Venous Findings: +---------+---------------+---------+-----------+----------+-------+          CompressibilityPhasicitySpontaneityPropertiesSummary +---------+---------------+---------+-----------+----------+-------+ CFV      Full           Yes      Yes                          +---------+---------------+---------+-----------+----------+-------+ SFJ      Full                                                 +---------+---------------+---------+-----------+----------+-------+ FV Prox  Full                                                 +---------+---------------+---------+-----------+----------+-------+ FV Mid                  Yes      Yes                          +---------+---------------+---------+-----------+----------+-------+ FV Distal               Yes      Yes                          +---------+---------------+---------+-----------+----------+-------+ PFV      Full                                                 +---------+---------------+---------+-----------+----------+-------+ POP      Full           Yes      Yes                          +---------+---------------+---------+-----------+----------+-------+ PTV      Full                                                 +---------+---------------+---------+-----------+----------+-------+  PERO     Full                                                 +---------+---------------+---------+-----------+----------+-------+  Left Venous Findings: +---------+---------------+---------+-----------+----------+--------------+           CompressibilityPhasicitySpontaneityPropertiesSummary        +---------+---------------+---------+-----------+----------+--------------+ CFV      Full           Yes      Yes                                 +---------+---------------+---------+-----------+----------+--------------+ SFJ      Full                                                        +---------+---------------+---------+-----------+----------+--------------+ FV Prox  Full                                                        +---------+---------------+---------+-----------+----------+--------------+ FV Mid   Full                                                        +---------+---------------+---------+-----------+----------+--------------+ FV Distal               Yes      Yes                                 +---------+---------------+---------+-----------+----------+--------------+ PFV      Full                                                        +---------+---------------+---------+-----------+----------+--------------+ POP      Full           Yes      Yes                                 +---------+---------------+---------+-----------+----------+--------------+ PTV                                                   Not visualized +---------+---------------+---------+-----------+----------+--------------+ PERO  Not visualized +---------+---------------+---------+-----------+----------+--------------+    Summary: Right: There is no evidence of deep vein thrombosis in the lower extremity. However, portions of this examination were limited- see technologist comments above. No cystic structure found in the popliteal fossa. Left: There is no evidence of deep vein thrombosis in the lower extremity. However, portions of this examination were limited- see technologist comments above. No cystic structure found in the popliteal fossa.  *See  table(s) above for measurements and observations. Electronically signed by Deitra Mayo MD on 09/10/2018 at 10:42:06 AM.    Final     Microbiology: No results found for this or any previous visit (from the past 240 hour(s)).   Labs: Basic Metabolic Panel: Recent Labs  Lab 09/12/18 0524 09/13/18 0447 09/14/18 0403 09/15/18 0502 09/15/18 0834 09/16/18 0359 09/17/18 0418  NA 142 141 139 138  --  140 140  K 3.3* 3.3* 3.0* 3.2*  --  3.5 3.7  CL 105 101 96* 94*  --  96* 94*  CO2 26 29 31  33*  --  34* 35*  GLUCOSE 172* 179* 180* 182*  --  177* 204*  BUN 53* 51* 52* 57*  --  55* 57*  CREATININE 1.30* 1.22* 1.15* 1.40*  --  1.19* 1.22*  CALCIUM 8.2* 8.3* 8.3* 8.0*  --  8.3* 8.4*  MG 1.6* 1.8 1.9  --  1.9  --   --   PHOS  --   --   --  4.1  --  3.5 3.6   Liver Function Tests: Recent Labs  Lab 09/15/18 0502 09/16/18 0359 09/17/18 0418  ALBUMIN 3.0* 2.9* 3.1*   No results for input(s): LIPASE, AMYLASE in the last 168 hours. No results for input(s): AMMONIA in the last 168 hours. CBC: Recent Labs  Lab 09/12/18 0524 09/13/18 0447 09/14/18 0403  WBC 6.6 7.4 7.1  NEUTROABS  --  4.9 4.8  HGB 8.9* 9.1* 8.9*  HCT 29.1* 30.4* 29.4*  MCV 89.5 91.0 91.0  PLT 273 285 258   Cardiac Enzymes: No results for input(s): CKTOTAL, CKMB, CKMBINDEX, TROPONINI in the last 168 hours. BNP: BNP (last 3 results) Recent Labs    09/08/18 1608  BNP 112.0*    ProBNP (last 3 results) No results for input(s): PROBNP in the last 8760 hours.  CBG: Recent Labs  Lab 09/16/18 0827 09/16/18 1210 09/16/18 1642 09/16/18 2132 09/17/18 0727  GLUCAP 160* 252* 160* 169* 160*       Signed:  Nita Sells MD   [facility] 09/17/2018, 9:59 AM

## 2018-09-17 NOTE — Care Management Important Message (Signed)
Important Message  Patient Details  Name: HAIVEN NARDONE MRN: [id number] Date of Birth: 1946-03-04   Medicare Important Message Given:  Yes    Kerin Salen 09/17/2018, 12:34 [employer] Message  Patient Details  Name: KALIEGH WILLADSEN MRN: [id number] Date of Birth: 1945/10/04   Medicare Important Message Given:  Yes    Kerin Salen 09/17/2018, 12:34 PM

## 2018-09-17 NOTE — Progress Notes (Signed)
[facility] initiated [facility] Medicare authorization request Fir[place] 09/14/18. Awaiting response prior to being able to proceed with admission there.  Sharren Bridge, MSW, LCSW Clinical Social Work 09/17/2018 [phone]

## 2018-09-17 NOTE — Progress Notes (Deleted)
Cardiology Office Note   Date:  09/17/2018   ID:  Megan, [name] 1946-05-12, MRN [id number]  PCP:  [name], MD  Cardiologist:   Jenkins Rouge, MD   No chief complaint on file.     History of Present Illness: Megan [name] is a 73 y.o. female who presents for consultation regarding CHF. Referred by [name] TTE reviewed from 09/09/18 EF 55-60% moderate LVH small LVOT gradient at rest no other valve disease estimated PA pressure 40 mmHg. Only grade one diastolic dysfunction Chronic LE edema venous duplex 09/10/18 bilateral no DVT She is obese and was admitted to hospital with weight gain, and weeping LE;s. DC on 09/17/18 after diuresis and application of UNA boots. Losartan and Tenoretic d/c and d/c on high dose lasix for anasarca She has been non compliant with CPAP Poorly controlled DM with A1c 7.1 D/c BUN was 57 Cr 1.22 K 3.7 TSH 3.2 Hct 27.2  With normal ferritin BNP was only 112  ***   Past Medical History:  Diagnosis Date  . Allergy    bee stings  . Anxiety   . Asthmatic bronchitis   . Broken arm 1957/2008   right  . Cataract    had surgery   . Depression   . Diabetes mellitus without complication ([place])    prediabetes diet controlled  . Diverticulosis   . GERD (gastroesophageal reflux disease)   . Glaucoma    BIL  . Hx of adenomatous colonic polyps 12/02/2010  . Hyperlipidemia   . Hypertension   . Inflammatory arthritis   . Knee bursitis   . Migraines   . Obesity   . Osteoarthritis   . Sleep apnea     Past Surgical History:  Procedure Laterality Date  . CARPAL TUNNEL RELEASE Left    left wrist  . CATARACT EXTRACTION W/ INTRAOCULAR LENS  IMPLANT, BILATERAL Bilateral    1999 left, 2008 right  . COLONOSCOPY    . EXCISION MORTON'S NEUROMA Left 1978   left foot  . GLAUCOMA SURGERY Bilateral    and laser surgery, 2004 left, 2008 right  . PARTIAL HYSTERECTOMY  1991   Left an ovary  . POLYPECTOMY    . TOENAIL AVULSION Right    big toe     No current facility-administered medications for this visit.    Current Outpatient Medications  Medication Sig Dispense Refill  . furosemide (LASIX) 20 MG tablet Take 5 tablets (100 mg total) by mouth 2 (two) times daily. 30 tablet   . magnesium oxide (MAG-OX) 400 (241.3 Mg) MG tablet Take 2 tablets (800 mg total) by mouth 2 (two) times daily.    . metoprolol tartrate (LOPRESSOR) 25 MG tablet Take 1 tablet (25 mg total) by mouth 2 (two) times daily.    [name] OLANZapine (ZYPREXA) 2.5 MG tablet Take 1 tablet (2.5 mg total) by mouth as needed (sad). 5 tablet 0  . polyethylene glycol (MIRALAX / GLYCOLAX) packet Take 17 g by mouth daily. 14 each 0  . polyvinyl alcohol (LIQUIFILM TEARS) 1.4 % ophthalmic solution Place 1 drop into both eyes as needed for dry eyes. 15 mL 0  . potassium chloride SA (K-DUR,KLOR-CON) 20 MEQ tablet Take 2 tablets (40 mEq total) by mouth 2 (two) times daily.     Facility-Administered Medications Ordered in Other Visits  Medication Dose Route Frequency Provider Last Rate Last Dose  . 0.9 %  sodium chloride infusion  250 mL Intravenous PRN Toy Baker, MD      .  acetaminophen (TYLENOL) tablet 650 mg  650 mg Oral Q6H PRN Toy Baker, MD   650 mg at 09/17/18 0241   Or  . acetaminophen (TYLENOL) suppository 650 mg  650 mg Rectal Q6H PRN Doutova, Anastassia, MD      . diphenhydrAMINE-zinc acetate (BENADRYL) 2-0.1 % cream   Topical TID PRN Blount, Scarlette Shorts T, NP      . feeding supplement (PRO-STAT SUGAR FREE 64) liquid 30 mL  30 mL Oral BID Bonnielee Haff, MD   30 mL at 09/17/18 1155  . furosemide (LASIX) tablet 100 mg  100 mg Oral BID Nita Sells, MD   100 mg at 09/17/18 6010  . insulin aspart (novoLOG) injection 0-5 Units  0-5 Units Subcutaneous QHS Toy Baker, MD   2 Units at 09/12/18 2251  . insulin aspart (novoLOG) injection 0-9 Units  0-9 Units Subcutaneous TID WC Toy Baker, MD   2 Units at 09/17/18 1306  . levothyroxine  (SYNTHROID, LEVOTHROID) tablet 50 mcg  50 mcg Oral Q0600 Toy Baker, MD   50 mcg at 09/17/18 [id number]  . lidocaine (LIDODERM) 5 % 1 patch  1 patch Transdermal Q24H Nita Sells, MD   1 patch at 09/16/18 1334  . magnesium oxide (MAG-OX) tablet 800 mg  800 mg Oral BID Nita Sells, MD   800 mg at 09/17/18 1203  . metoprolol tartrate (LOPRESSOR) tablet 25 mg  25 mg Oral BID Bonnielee Haff, MD   25 mg at 09/17/18 1159  . multivitamin with minerals tablet 1 tablet  1 tablet Oral Daily Bonnielee Haff, MD   1 tablet at 09/17/18 1158  . ondansetron (ZOFRAN) tablet 4 mg  4 mg Oral Q6H PRN Toy Baker, MD   4 mg at 09/17/18 0348   Or  . ondansetron (ZOFRAN) injection 4 mg  4 mg Intravenous Q6H PRN Toy Baker, MD   4 mg at 09/13/18 1832  . pantoprazole (PROTONIX) EC tablet 40 mg  40 mg Oral Daily Doutova, Anastassia, MD   40 mg at 09/17/18 1200  . polyethylene glycol (MIRALAX / GLYCOLAX) packet 17 g  17 g Oral Daily Nita Sells, MD   17 g at 09/17/18 1142  . polyvinyl alcohol (LIQUIFILM TEARS) 1.4 % ophthalmic solution 1 drop  1 drop Both Eyes PRN Nita Sells, MD   1 drop at 09/16/18 2213  . potassium chloride SA (K-DUR,KLOR-CON) CR tablet 40 mEq  40 mEq Oral BID Nita Sells, MD   40 mEq at 09/17/18 1159  . simvastatin (ZOCOR) tablet 20 mg  20 mg Oral q1800 Doutova, Anastassia, MD   20 mg at 09/16/18 1710  . sodium chloride flush (NS) 0.9 % injection 3 mL  3 mL Intravenous Q12H Doutova, Anastassia, MD   3 mL at 09/16/18 1051  . sodium chloride flush (NS) 0.9 % injection 3 mL  3 mL Intravenous PRN Doutova, Anastassia, MD      . vitamin B-12 (CYANOCOBALAMIN) tablet 1,000 mcg  1,000 mcg Oral Daily Bonnielee Haff, MD   1,000 mcg at 09/17/18 1159    Allergies:   Aspirin; Bee pollen; Codeine; Fish oil; Lisinopril; Sulfa antibiotics; Sulfasalazine; Vicodin [hydrocodone-acetaminophen]; Camphor; Camphor; and Penicillins    Social History:  The  patient  reports that she quit smoking about 47 years ago. Her smoking use included cigarettes. She has never used smokeless tobacco. She reports that she does not drink alcohol or use drugs.   Family History:  The patient's family history includes Asthma in her brother; Bone cancer in  her maternal grandfather; Colon polyps in her maternal grandfather; Diabetes in her brother, father, mother, and sister; Hypertension in her father and mother; Obesity in an other family member.    ROS:  Please see the history of present illness.   Otherwise, review of systems are positive for none.   All other systems are reviewed and negative.    PHYSICAL EXAM: VS:  There were no vitals taken for this visit. , BMI There is no height or weight on file to calculate BMI. Affect appropriate Chronically ill obese female  HEENT: normal Neck supple with no adenopathy JVP normal no bruits no thyromegaly Lungs clear with no wheezing and good diaphragmatic motion Heart:  S1/S2 SEM  murmur, no rub, gallop or click PMI normal Abdomen: benighn, BS positve, no tenderness, no AAA no bruit.  No HSM or HJR Distal pulses intact with no bruits Plus 3 LE edema *** Neuro non-focal Skin warm and dry No muscular weakness    EKG:  09/09/18 NSR rate 74 normal    Recent Labs: 09/08/2018: B Natriuretic Peptide 112.0 09/09/2018: ALT 8; TSH 3.265 09/14/2018: Hemoglobin 8.9; Platelets 258 09/15/2018: Magnesium 1.9 09/17/2018: BUN 57; Creatinine, Ser 1.22; Potassium 3.7; Sodium 140    Lipid Panel No results found for: CHOL, TRIG, HDL, CHOLHDL, VLDL, LDLCALC, LDLDIRECT    Wt Readings from Last 3 Encounters:  09/17/18 252 lb 14.4 oz (114.7 kg)  09/01/18 269 lb 1.6 oz (122.1 kg)  05/31/18 249 lb (112.9 kg)      Other studies Reviewed: Additional studies/ records that were reviewed today include: Notes from recent admission , labs LE venous duplex TTE.    ASSESSMENT AND PLAN:  1.  Edema:  This is not a cardiac issue  Related to venous insufficiency Duplex with no DVT. Also obesity and non compliance with CPAP. Continue diuretics and f/u with primary and wound center.  2. DM:  Discussed low carb diet.  Target hemoglobin A1c is 6.5 or less.  Continue current medications. 3. Anemia:  Needs further w/u does not appear to be from iron deficiency  4. Murmur: small LVOT gradient and moderate LVH continue beta blocker 5. OSA:  Encouraged use of CPAP 6. HlD on statin labs with primary    Current medicines are reviewed at length with the patient today.  The patient does not have concerns regarding medicines.  The following changes have been made:  no change  Labs/ tests ordered today include: None No orders of the defined types were placed in this encounter.    Disposition:   FU with cardiology PRN      Signed, Jenkins Rouge, MD  09/17/2018 4:09 PM    [facility] [place], [place], [place]  [address] Phone: [phone]; Fax: [phone]

## 2018-09-18 LAB — GLUCOSE, CAPILLARY
Glucose-Capillary: 188 mg/dL — ABNORMAL HIGH (ref 70–99)
Glucose-Capillary: 193 mg/dL — ABNORMAL HIGH (ref 70–99)

## 2018-09-18 NOTE — Progress Notes (Signed)
Patient has been stable for discharge since 1/20-her weight today is 114 she is doing fair and working with therapy No new changes please see my discharge summary dated 1/20 can go to facility today and social work is aware

## 2018-09-18 NOTE — Clinical Social Work Placement (Addendum)
Pt admitting to [facility] for short term rehab- [phone], room 404-A Pt will transport by [facility] will arrange once facility ready for pt to admit. Pt's sister Lorna Dibble aware of SNF admission today. DC information provided on the HUB. [facility] Medicare approved pt's admission   CLINICAL SOCIAL WORK PLACEMENT  NOTE  Date:  09/18/2018  Patient Details  Name: BIANA HAGGAR MRN: [id number] Date of Birth: 1946/02/11  Clinical Social Work is seeking post-discharge placement for this patient at the [place] level of care (*CSW will initial, date and re-position this form in  chart as items are completed):  Yes   Patient/family provided with [place] Work Department's list of facilities offering this level of care within the geographic area requested by the patient (or if unable, by the patient's family).  Yes   Patient/family informed of their freedom to choose among providers that offer the needed level of care, that participate in Medicare, Medicaid or managed care program needed by the patient, have an available bed and are willing to accept the patient.  Yes   Patient/family informed of [facility]'s ownership interest in [facility] and [facility], as well as of the fact that they are under no obligation to receive care at these facilities.  PASRR submitted to EDS on 09/10/18     PASRR number received on 09/12/18     Existing PASRR number confirmed on       FL2 transmitted to all facilities in geographic area requested by pt/family on 09/12/18     FL2 transmitted to all facilities within larger geographic area on       Patient informed that his/her managed care company has contracts with or will negotiate with certain facilities, including the following:        Yes   Patient/family informed of bed offers received.  Patient chooses bed at [place], [place]     Physician recommends and patient chooses bed at  [place], [place]    Patient to be transferred to [place], [place] on 09/18/18.  Patient to be transferred to facility by [facility]     Patient family notified on 09/18/18 of transfer.  Name of family member notified:  sister [facility]     PHYSICIAN       Additional Comment:    _______________________________________________ Nila Nephew, LCSW 09/18/2018, 10:48 AM [phone]

## 2018-09-18 NOTE — Progress Notes (Signed)
Occupational Therapy Treatment Patient Details Name: Megan [name] MRN: [id number] DOB: Sep 10, 1945 Today's Date: 09/18/2018    History of present illness 73 year old female admitted 09/08/2018 presenting with bilateral leg swelling and pain since 06/2018 with skin breaking down and weeping.  Diastolic CHF. PMH: DM, HTN, CHF, obstructive sleep apnea, hypothyroidism, history of lymphedema. Prior to admission walking with walker. Used to walk with cane recently.   OT comments  Pt presents seated in recliner pleasant and willing to work with therapy this session. Pt performing room level functional mobility using RW and standing grooming ADL with overall close minguard assist. Pt stood approx 6-7 min at sink for completion of grooming ADL prior to taking seated rest break. Pt with DOE grossly 2/4 after standing activity. She demonstrates good motivation to return to PLOF. Continue to recommend SNF level therapy services at time of discharge to maximize her safety and independence with ADL and mobility prior to return home. Will continue to follow acutely.   Follow Up Recommendations  SNF;Supervision/Assistance - 24 hour    Equipment Recommendations  None recommended by OT    Recommendations for Other Services      Precautions / Restrictions Precautions Precautions: Fall Restrictions Weight Bearing Restrictions: No       Mobility Bed Mobility               General bed mobility comments: in chair on arrival  Transfers Overall transfer level: Needs assistance Equipment used: Rolling walker (2 wheeled) Transfers: Sit to/from Stand Sit to Stand: Min guard Stand pivot transfers: Min guard       General transfer comment: min/guard for safety. pt demonstrates safe hand placement     Balance   Sitting-balance support: No upper extremity supported;Feet supported Sitting balance-Leahy Scale: Good     Standing balance support: Bilateral upper extremity supported;During  functional activity;Single extremity supported Standing balance-Leahy Scale: Fair Standing balance comment: reliant on UEs during dynamic mobility; pt able to static stand without UE support and with single UE support during grooming ADL with minguard for balance                           ADL either performed or assessed with clinical judgement   ADL Overall ADL's : Needs assistance/impaired Eating/Feeding: Modified independent;Sitting   Grooming: Wash/dry face;Oral care;Min guard;Standing Grooming Details (indicate cue type and reason): close minguard for standing balance                             Functional mobility during ADLs: Min guard;Rolling walker General ADL Comments: pt stood approx 6-7 min for completion of grooming ADLs; performed additional seated LB exercises after return to sitting in relciner, DOE 2/4 after standing activity      Vision       Perception     Praxis      Cognition Arousal/Alertness: Awake/alert Behavior During Therapy: WFL for tasks assessed/performed Overall Cognitive Status: Within Functional Limits for tasks assessed                                          Exercises General Exercises - Lower Extremity Long Arc Quad: AROM;10 reps;Seated;Both Hip ABduction/ADduction: AROM;10 reps;Supine;Both Toe Raises: AROM;Both;10 reps;Seated Heel Raises: AROM;Both;10 reps;Seated   Shoulder Instructions       General  Comments      Pertinent Vitals/ Pain       Pain Assessment: No/denies pain  Home Living                                          Prior Functioning/Environment              Frequency  Min 2X/week        Progress Toward Goals  OT Goals(current goals can now be found in the care plan section)  Progress towards OT goals: Progressing toward goals  Acute Rehab OT Goals Patient Stated Goal: get better, return home when safe to do so without falling OT Goal  Formulation: With patient Time For Goal Achievement: 09/25/18 Potential to Achieve Goals: Good  Plan Discharge plan remains appropriate    Co-evaluation                 AM-PAC OT "6 Clicks" Daily Activity     Outcome Measure   Help from another person eating meals?: None Help from another person taking care of personal grooming?: None Help from another person toileting, which includes using toliet, bedpan, or urinal?: A Little Help from another person bathing (including washing, rinsing, drying)?: A Lot Help from another person to put on and taking off regular upper body clothing?: None Help from another person to put on and taking off regular lower body clothing?: A Lot 6 Click Score: 19    End of Session Equipment Utilized During Treatment: Gait belt;Rolling walker  OT Visit Diagnosis: Other abnormalities of gait and mobility (R26.89);Muscle weakness (generalized) (M62.81)   Activity Tolerance Patient tolerated treatment well   Patient Left in chair;with call bell/phone within reach   Nurse Communication Mobility status        Time: 3419-3790 OT Time Calculation (min): 28 min  Charges: OT General Charges $OT Visit: 1 Visit OT Treatments $Self Care/Home Management : 23-37 mins  Megan [name], OT Supplemental Rehabilitation Services Pager [phone] Office [phone]    Megan [name] 09/18/2018, 10:40 AM

## 2018-09-20 ENCOUNTER — Ambulatory Visit: Payer: Medicare Other | Admitting: Cardiovascular Disease

## 2018-09-21 LAB — GLUCOSE, CAPILLARY: Glucose-Capillary: 174 mg/dL — ABNORMAL HIGH (ref 70–99)

## 2018-10-12 ENCOUNTER — Encounter: Payer: Self-pay | Admitting: Cardiovascular Disease

## 2018-10-15 ENCOUNTER — Ambulatory Visit (INDEPENDENT_AMBULATORY_CARE_PROVIDER_SITE_OTHER): Payer: Medicare Other | Admitting: Cardiovascular Disease

## 2018-10-15 ENCOUNTER — Encounter: Payer: Self-pay | Admitting: Cardiovascular Disease

## 2018-10-15 VITALS — BP 118/60 | HR 96 | Ht 62.5 in | Wt 256.0 lb

## 2018-10-15 DIAGNOSIS — R600 Localized edema: Secondary | ICD-10-CM | POA: Diagnosis not present

## 2018-10-15 DIAGNOSIS — G4733 Obstructive sleep apnea (adult) (pediatric): Secondary | ICD-10-CM

## 2018-10-15 DIAGNOSIS — I5032 Chronic diastolic (congestive) heart failure: Secondary | ICD-10-CM | POA: Diagnosis not present

## 2018-10-15 MED ORDER — TORSEMIDE 100 MG PO TABS: 50.0000 mg | ORAL_TABLET | Freq: Two times a day (BID) | ORAL | 3 refills | Status: DC
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
## 2018-10-15 NOTE — Patient Instructions (Addendum)
Medication Instructions:  Your physician has recommended you make the following change in your medication:   1. STOP: furosemide (lasix)  2. START: torsemide (demedex) 100 mg tablet: Take 1/2 tablet (50 mg total) by mouth twice a day  If you need a refill on your cardiac medications before your next appointment, please call your pharmacy.   Lab work: Your physician recommends that you return for lab work in: 3 weeks for BMET  If you have labs (blood work) drawn today and your tests are completely normal, you will receive your results only by: [name] MyChart Message (if you have MyChart) OR . A paper copy in the mail If you have any lab test that is abnormal or we need to change your treatment, we will call you to review the results.  Testing/Procedures: None ordered  Follow-Up: Your physician recommends that you schedule a follow-up appointment in: 6-8 weeks with Richardson Dopp, PA  Any Other Special Instructions Will Be Listed Below (If Applicable).  For your  leg edema you  should do  the following 1. Leg elevation - I recommend the Lounge Dr. Leg rest.  See below for details  2. Salt restriction  -  Use potassium chloride instead of regular salt as a salt substitute. 3. Walk regularly 4. Compression hose - [facility] 5. Weight loss    Available on [email or web address] Or  Go to [email or web address]

## 2018-10-24 ENCOUNTER — Telehealth: Payer: Self-pay | Admitting: Cardiovascular Disease

## 2018-10-24 NOTE — Telephone Encounter (Signed)
Pt is calling to ask if the una boot she is wearing and what Dr Acie Fredrickson advised at her OV on 2/17, to purchase the Lounge Dr. leg rest, are the same thing. Endorsed to the pt that these are 2 different things that are used for 2 different reasons.  Informed the pt that her Ardelia Mems boot is worn based on her wound on the affected extremity, and the Lounge Dr. Bertram Gala rest is purchased online and used while she is at rest to help reduce the edema to her non-affected lower extremity.  Informed the pt that her Ardelia Mems boot is worn and assessed by RN's from [facility].  Endorsed to the pt that when the nurses come to her home to assess her lower extremity with the una boot on it, they are assessing her wound and reporting their findings to her PCP Dr. Brigitte Pulse.  Informed the pt that if she has any questions or concerns regarding her una boot/wound, she can ask her home health RN, or call her PCP, for they have been heavily following her wound since discharged from the hospital.  Advised the pt that Dr Acie Fredrickson is following her cardiac status and CHF, which is why he advised on change in diuresis therapy, purchasing Lounge Dr. Leg rest, he educated her in reducing her salt intake, to walk regularly and to go to [facility] supply to purchase a compression stocking for the non-affected lower extremity edema. Pt states her weights have much improved since she started torsemide, and she is now weighing in at 246 lbs and at 2/17 OV she was 256 lbs. Pt states her home health RN will be back to her home this Friday, and she will have her check the status of how her wound is, and she will check with her on how much longer her Ardelia Mems boot will be worn. Pt verbalized understanding and agrees with this plan.  Pt was very gracious for all the assistance and clarity provided.  Will send this message to Dr Elmarie Shiley RN as an Juluis Rainier, upon return to the office.

## 2018-10-24 NOTE — Telephone Encounter (Signed)
Pt called with two questions 1) Does the pt need to contact a wound care nurse,  or will the office call on her behalf?  2) Is the pt still supposed to be wearing the [place] boot?  She is just confused and would like advice.

## 2018-10-29 ENCOUNTER — Telehealth: Payer: Self-pay | Admitting: Cardiovascular Disease

## 2018-10-29 NOTE — Telephone Encounter (Signed)
Spoke with patient who called to ask if blood work for iron level can be done at her lab appointment here next Monday 3/9. She states she is shaking, having black tarry stools, not feeling well and is wondering if she is having a reaction to [employer]. I advised her to call PCP to report her symptoms and that if additional lab work is needed we can obtain that at the lab appointment. I advised that if she has appointment or lab work with PCP this week, that bmet for medication change from furosemide to torsemide can be obtained at that time instead of on Monday. Patient verbalized understanding and agreement and thanked me for the call.

## 2018-10-29 NOTE — Telephone Encounter (Signed)
New Message    Pt c/o medication issue:  1. Name of Medication: Ferrex 150mg    2. How are you currently taking this medication (dosage and times per day)? Once per day  3. Are you having a reaction (difficulty breathing--STAT)? Patient wants to check  4. What is your medication issue? Patient having headaches and tary stool.  Patient also has been shaking real bad and wants labs ordered to see if her Iron levels are ok.

## 2018-10-30 ENCOUNTER — Encounter: Payer: Self-pay | Admitting: Neurology

## 2018-10-30 ENCOUNTER — Ambulatory Visit (INDEPENDENT_AMBULATORY_CARE_PROVIDER_SITE_OTHER): Payer: Medicare Other | Admitting: Neurology

## 2018-10-30 VITALS — BP 108/64 | HR 89 | Ht 62.0 in | Wt 246.0 lb

## 2018-10-30 DIAGNOSIS — I509 Heart failure, unspecified: Secondary | ICD-10-CM

## 2018-10-30 DIAGNOSIS — L03119 Cellulitis of unspecified part of limb: Secondary | ICD-10-CM

## 2018-10-30 DIAGNOSIS — G4737 Central sleep apnea in conditions classified elsewhere: Secondary | ICD-10-CM

## 2018-10-30 DIAGNOSIS — I5031 Acute diastolic (congestive) heart failure: Secondary | ICD-10-CM

## 2018-10-30 DIAGNOSIS — N171 Acute kidney failure with acute cortical necrosis: Secondary | ICD-10-CM

## 2018-10-30 DIAGNOSIS — E0842 Diabetes mellitus due to underlying condition with diabetic polyneuropathy: Secondary | ICD-10-CM

## 2018-10-30 DIAGNOSIS — E118 Type 2 diabetes mellitus with unspecified complications: Secondary | ICD-10-CM | POA: Diagnosis not present

## 2018-10-30 NOTE — Progress Notes (Signed)
SLEEP MEDICINE CLINIC   Provider:  Larey Seat, MD   Primary Care Physician:  [name], MD  Referring Provider: Leanna Battles, MD  @ [facility]   Chief Complaint  Patient presents with  . New Patient (Initial Visit)    pt here alone, rm 11. Pt. was in the hospital in [place] for cellulitis and heart failure. pt had a sleep study completed over 10 yrs ago.  Pt states Megan [name] was diagnosed with sleep apnea and ordered a machine.  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  PCP recommends for her to get back on CPAP, and Megan [name] was established with [facility]    HPI:  Megan [name] is a 73 y.o. female patient  seen here for resumption of CPAP care, referred by Dr. Janie Morning who followed her at [facility]- her outside PCP is Dr. [name] . The consultation takes place on 3-3-20202.  I have the pleasure of meeting with Megan [name] today, a 73 year old Caucasian female patient who was recently widowed in June 2019 in her 23 th year of marriage. Megan [name] herself has enjoyed good health for a long time and was only hospitalized once.  Megan [name] has developed by now diabetes mellitus type 2 and Megan [name] has a complicated peripheral neuropathy that has been followed by Dr. Narda Amber, DO.  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 Megan [name] is Dr. Abbe Amsterdam Nahser's patient. Megan [name] had for a long time known to have obstructive sleep apnea and used BiPAP  Therapy until about 5 years ago.  When sheb ecame a caretaker Megan [name] felt Megan [name] couldn't attend to her husbands need ( multiple Myeloma patient) and couldn't hear him through the BiPAP sounds, Megan [name] disliked the mask intensely.   Now Megan [name] needs to concentrate on her own health again and it was recommended that Megan [name] get retested and resume therapy.  Sleep habits are as follows:  Dinner time is  Supper time is now on her sister's schedule- Megan [name] has lost appetite. 5.30- 7.30 PM, and bedtime is 10.30 Pm, 20 minutes latency. Sleeps on her sides, left mostly, on 1 pillow. Flat bed.  4-5 times nocturia each night. Megan [name] will stay awake if woken after 6 AM, otherwise Megan [name] can go back to sleep easily. Megan [name] rises at 6.30 AM, always spontaneously.    Non scheduled naps, but may fall asleep any time after dinner- may doze.    Sleep medical history: untreated sleep apnea, The patient was hospitalized in January 2020 imaging study results hospital labs were reviewed transthoracic echocardiogram showed left ventricular ejection fraction 55 to 60% with a outflow obstruction a gradient of 10 mmHg.  Moderate sized hiatal hernia was found.  No acute pulmonary disease, abdominal ultrasound showed bilateral renal atrophy but no evidence of hydronephrosis or hydronephrosis.  DVT ultrasound showed no evidence of deep vein thrombosis.  Creatinine was 1.2, albumin 3.1, hemoglobin 8.9 hematocrit 29.4 platelets 250 8K.  By September 21, 2018 hemoglobin was 10.5 hematocrit 32.  Her lower extremity edema improved with bilateral Unna boots. Wrapping twice weekly continues.   (Still wearing)  Has diabetes mellitus type 2 stable on metformin.  Anemia the patient improved just with iron replacement  therapy.  Hypothyroidism is stable on Synthroid 50 mcg daily, Megan [name] does need to be followed now for obstructive sleep apnea in the setting of congestive heart failure Megan [name] may need BiPAP again.  Family sleep history:    Social history: college graduate 1999, adult student- last workplace was [employer].  Retired age 36. Deceased husband worked last in Investment banker, corporate. The patient is currently living with her sister in [place].  Megan [name] longs to return home. Megan [name] has  lived alone since her husband's death, Megan [name] had a miscarriage with one pregnancy and a remote history of tobacco use very long ago.   Review of Systems: Out of a complete 14 system review, the patient complains of only the following symptoms, and all other reviewed systems are negative.  Snoring, nocturia.  CHF, CKD and diabetes. Ankle and leg edema.  Megan [name] is ding PT at home - twice weekly, unna boots twice a week- feels exhausted after sessions.   Epworth score :  5, Fatigue severity score 34  , depression score- refused.   Social History   Socioeconomic History  . Marital status: Married    Spouse name: Lynnae Sandhoff  . Number of children: 0  . Years of education: Not on file  . Highest education level: Not on file  Occupational History  . Occupation: retired  Scientific laboratory technician  . Financial resource strain: Not on file  . Food insecurity:    Worry: Not on file    Inability: Not on file  . Transportation needs:    Medical: Not on file    Non-medical: Not on file  Tobacco Use  . Smoking status: Former Smoker    Types: Cigarettes    Last attempt to quit: 08/30/1971    Years since quitting: 47.2  . Smokeless tobacco: Never Used  Substance and Sexual Activity  . Alcohol use: No    Alcohol/week: 0.0 standard drinks  . Drug use: No  . Sexual activity: Not on file  Lifestyle  . Physical activity:    Days per week: Not on file    Minutes per session: Not on file  . Stress: Not on file  Relationships  . Social connections:    Talks on phone: Not on file    Gets together: Not on file    Attends religious service: Not on file    Active member of club or organization: Not on file    Attends meetings of clubs or organizations: Not on file    Relationship status: Not on file  . Intimate partner violence:    Fear of current or ex partner: Not on file    Emotionally abused: Not on file    Physically abused: Not on file    Forced sexual activity: Not on file  Other Topics Concern  . Not on  file  Social History Narrative   Megan [name] is married no children retired.  Lives with husband in a one story home.  Retired from Geologist, engineering for [employer].    Family History  Problem Relation Age of Onset  . Colon polyps Maternal Grandfather   . Bone cancer Maternal Grandfather   . Hypertension Mother   . Diabetes Mother   . Hypertension Father   . Diabetes Father   . Asthma Brother   . Obesity Other   . Diabetes Sister        x 2  . Diabetes Brother        prediabetes  . Rectal cancer Neg Hx   .  Stomach cancer Neg Hx   . Esophageal cancer Neg Hx     Past Medical History:  Diagnosis Date  . Allergy    bee stings  . Anxiety   . Asthmatic bronchitis   . Broken arm 1957/2008   right  . Cataract    had surgery   . Depression   . Diabetes mellitus without complication ([place])    prediabetes diet controlled  . Diverticulosis   . GERD (gastroesophageal reflux disease)   . Glaucoma    BIL  . Hx of adenomatous colonic polyps 12/02/2010  . Hyperlipidemia   . Hypertension   . Hypothyroidism 05/2018  . Inflammatory arthritis   . Knee bursitis   . Migraines   . Obesity   . Osteoarthritis   . Sleep apnea     Past Surgical History:  Procedure Laterality Date  . CARPAL TUNNEL RELEASE Left    left wrist  . CATARACT EXTRACTION W/ INTRAOCULAR LENS  IMPLANT, BILATERAL Bilateral    1999 left, 2008 right  . COLONOSCOPY    . EXCISION MORTON'S NEUROMA Left 1978   left foot  . GLAUCOMA SURGERY Bilateral    and laser surgery, 2004 left, 2008 right  . PARTIAL HYSTERECTOMY  1991   Left an ovary  . POLYPECTOMY    . TOENAIL AVULSION Right    big toe    Current Outpatient Medications  Medication Sig Dispense Refill  . Acetaminophen (TYLENOL ARTHRITIS PAIN PO) Take 650 mg by mouth as needed (pain).     [name] levothyroxine (SYNTHROID, LEVOTHROID) 50 MCG tablet Take 50 mcg by mouth daily before breakfast.    . magnesium oxide (MAG-OX) 400 (241.3 Mg) MG tablet Take 2 tablets (800 mg total)  by mouth 2 (two) times daily.    . metFORMIN (GLUCOPHAGE) 500 MG tablet Take 500 mg by mouth 2 (two) times daily with a meal.   6  . metoprolol tartrate (LOPRESSOR) 25 MG tablet Take 1 tablet (25 mg total) by mouth 2 (two) times daily.    . Multiple Vitamin (MULTIVITAMIN WITH MINERALS) TABS tablet Take 1 tablet by mouth daily.    [name] OLANZapine (ZYPREXA) 2.5 MG tablet Take 1 tablet (2.5 mg total) by mouth as needed (sad). 5 tablet 0  . Omeprazole-Sodium Bicarbonate (ZEGERID) 20-1100 MG CAPS capsule Take 1 capsule by mouth at bedtime. 28 each 0  . pantoprazole (PROTONIX) 40 MG tablet TAKE 1 TABLET BY MOUTH  DAILY BEFORE BREAKFAST 90 tablet 0  . polyvinyl alcohol (LIQUIFILM TEARS) 1.4 % ophthalmic solution Place 1 drop into both eyes as needed for dry eyes. 15 mL 0  . potassium chloride SA (K-DUR,KLOR-CON) 20 MEQ tablet Take 2 tablets (40 mEq total) by mouth 2 (two) times daily.    . simvastatin (ZOCOR) 20 MG tablet Take 20 mg by mouth daily at 6 PM.     . torsemide (DEMADEX) 100 MG tablet Take 0.5 tablets (50 mg total) by mouth 2 (two) times daily. 90 tablet 3  . VITAMIN E PO Take 1 tablet by mouth daily.     No current facility-administered medications for this visit.     Allergies as of 10/30/2018 - Review Complete 10/30/2018  Allergen Reaction Noted  . Aspirin Anaphylaxis 11/18/2010  . Bee pollen  12/02/2014  . Codeine Nausea And Vomiting 11/18/2010  . Fish oil Diarrhea 08/28/2014  . Lisinopril Cough 08/28/2014  . Sulfa antibiotics Nausea And Vomiting 11/18/2010  . Sulfasalazine Nausea And Vomiting 11/18/2010  . Vicodin [hydrocodone-acetaminophen] Nausea  And Vomiting 08/28/2014  . Camphor Dermatitis 12/02/2010  . Camphor Dermatitis 12/02/2010  . Penicillins Rash 11/18/2010    Vitals: BP 108/64   Pulse 89   Ht 5' 2"  (1.575 m)   Wt 246 lb (111.6 kg)   BMI 44.99 kg/m  Last Weight:  Wt Readings from Last 1 Encounters:  10/30/18 246 lb (111.6 kg)   KLK:JZPH mass index is 44.99  kg/m.     Last Height:   Ht Readings from Last 1 Encounters:  10/30/18 5' 2"  (1.575 m)    Physical exam:  General: The patient is awake, alert and appears not in acute distress.  The patient is well groomed. Head: Normocephalic, atraumatic. Neck is supple. Mallampati: 5  neck circumference: 17"  . Nasal airflow , Retrognathia is seen.  Cardiovascular:  Regular rate and rhythm, without  murmurs or carotid bruit, and without distended neck veins. Respiratory: Lungs are clear to auscultation. Skin:  Without evidence of edema, or rash Trunk: BMI is 46. The patient's posture is stooped.    Neurologic exam : The patient is awake and alert, oriented to place and time.   Memory subjective  described as intact.   Attention span & concentration ability appears normal.  Speech is fluent, without dysarthria, dysphonia or aphasia.  Mood and affect are appropriate.  Cranial nerves: Pupils are equal and briskly reactive to light. Glaucoma and cataract surgery in both eyes-  Extraocular movements in vertical and horizontal planes intact and without nystagmus. Visual fields by finger perimetry are intact. Hearing to finger rub intact.   Facial sensation intact to fine touch. There is mild titubation noted.   Facial motor strength is symmetric and tongue and uvula move midline. Shoulder shrug was symmetrical.   Motor exam: Normal tone, muscle bulk and symmetric strength in upper  Extremities, but limited grip strength and ROM limitations. Megan [name] cannot easily get to a back pocket. Cant open a cap easily. .  Sensory:  Fine touch, pinprick and vibration were tested in all extremities.  These are impaired in her edematous lower extremities. Proprioception tested in the upper extremities was normal.  Coordination: Rapid alternating movements in the fingers/hands was normal.  Finger-to-nose maneuver  normal without evidence of ataxia, dysmetria or tremor.  Gait and station: Patient walks with a walker as  assistive device Megan [name] is not able to climb up to the exam table unassisted . Strength limited -  Stance is wide based , Toe and heel stand were deferred as was tandem gait.   Assessment:  After physical and neurologic examination, review of laboratory studies,  Personal review of imaging studies, reports of other /same  Imaging studies, results of polysomnography and / or neurophysiology testing and pre-existing records as far as provided in visit., my assessment is :  1) I reviewed the patient's echocardiogram, her medication list for laboratories.  There is definitely a host of comorbidities present that could have changed the type of apnea from obstructive apnea to a more central kind of apnea.  Megan [name] was using BiPAP and I am not sure if that was already meant to ease a milder form of congestive heart failure or if it was for comfort as the patient may have required high pressures.  Megan [name] has never found a mask that is truly comfortable for her and this may be another reason for the noncompliance.  Given her complicated medical history just over the last 6 months I think we need an attended sleep study and I would  rather have the patient return for a full night titration.  I want to pay especially attention to a good mask fit but is comfortable at the patient's choice.  Since Megan [name] is dependent on a family member, in this case her sister, to bring her to the sleep lab we will work with her family to make sure that Megan [name] can be delivered and brought back to the house on a day that is logistically possible.    The patient was advised of the nature of the diagnosed disorder , the treatment options and the  risks for general health and wellness arising from not treating the condition.   I spent more than 55 minutes of face to face time with the patient.  Greater than 50% of time was spent in counseling and coordination of care. We have discussed the diagnosis and differential and I answered the patient's  questions.    Plan:  Treatment plan and additional workup :  Plan for SPLIT night PSG, may return for full night titration study if CSA is present. May require BiPAP.  Last sleep study over 10 years ago,  at [facility] location , [facility].     No orders of the defined types were placed in this encounter.     Larey Seat, MD 05/02/981, 86:75 AM  Certified in Neurology by ABPN Certified in [place] by [facility] [address], [place], [place] [address]

## 2018-11-05 ENCOUNTER — Other Ambulatory Visit: Payer: Medicare Other | Admitting: *Deleted

## 2018-11-05 DIAGNOSIS — I5032 Chronic diastolic (congestive) heart failure: Secondary | ICD-10-CM

## 2018-11-05 DIAGNOSIS — R600 Localized edema: Secondary | ICD-10-CM

## 2018-11-05 DIAGNOSIS — G4733 Obstructive sleep apnea (adult) (pediatric): Secondary | ICD-10-CM

## 2018-11-05 LAB — BASIC METABOLIC PANEL
BUN/Creatinine Ratio: 22 (ref 12–28)
BUN: 29 mg/dL — ABNORMAL HIGH (ref 8–27)
CO2: 23 mmol/L (ref 20–29)
Calcium: 8.1 mg/dL — ABNORMAL LOW (ref 8.7–10.3)
Chloride: 102 mmol/L (ref 96–106)
Creatinine, Ser: 1.29 mg/dL — ABNORMAL HIGH (ref 0.57–1.00)
GFR calc Af Amer: 47 mL/min/{1.73_m2} — ABNORMAL LOW (ref >59)
GFR calc non Af Amer: 41 mL/min/{1.73_m2} — ABNORMAL LOW (ref >59)
Glucose: 113 mg/dL — ABNORMAL HIGH (ref 65–99)
Potassium: 4.5 mmol/L (ref 3.5–5.2)
Sodium: 145 mmol/L — ABNORMAL HIGH (ref 134–144)

## 2018-11-06 ENCOUNTER — Telehealth: Payer: Self-pay | Admitting: Cardiovascular Disease

## 2018-11-06 NOTE — Telephone Encounter (Signed)
Knee high 20 mmHg pressure should work

## 2018-11-06 NOTE — Telephone Encounter (Signed)
Called patient who requests a Rx for compression stockings so that she can get them covered by her insurance through the [place]. I offered to mail a Rx to her and she agrees with that plan. She states the pair she has at home are too small and she was advised not to wear them by the therapist. I advised her to continue to elevate her legs until she receives her Rx. She verbalized understanding and agreement and thanked me for the call.

## 2018-11-06 NOTE — Telephone Encounter (Signed)
New Message:     Pt says she needs a prescription for her Compression Stockins. She said to be sure to include the strength she needs.

## 2018-11-09 ENCOUNTER — Telehealth: Payer: Self-pay

## 2018-11-14 NOTE — Telephone Encounter (Signed)
Patient's compression prescription was placed in outgoing mail on Thursday March 12.

## 2018-11-22 ENCOUNTER — Telehealth: Payer: Self-pay | Admitting: Physician Assistant

## 2018-11-22 NOTE — Telephone Encounter (Signed)
Discussed changing appt for 11/26/2018 due to COVID-19 pandemic. She is willing to try Telehealth. I have sent a MyChart link to her email and mobile #.   Please call the patient on Fri [phone] to give instructions and to get consent.  Richardson Dopp, PA-C    11/22/2018 3:39 PM

## 2018-11-23 NOTE — Telephone Encounter (Signed)
I s/w pt and confirmed to her doing a phone visit with Richardson Dopp, PAC. Pt did not want to load WebEx as she states she is not computer/phone savvy. Pt is agreeable to consent and understands her insurance will be billed for phone visit.      TELEPHONE CALL NOTE  Megan [name] has been deemed a candidate for a follow-up tele-health visit to limit community exposure during the Covid-19 pandemic. I spoke with the patient via phone to ensure availability of phone/video source, confirm preferred email & phone number, and discuss instructions and expectations.  I reminded Megan [name] to be prepared with any vital sign and/or heart rhythm information that could potentially be obtained via home monitoring, at the time of her visit. I reminded Megan [name] to expect a phone call at the time of her visit if her visit.  Did the patient verbally acknowledge consent to treatment?  Megan [name], [facility] 11/23/2018 4:51 PM   CONSENT FOR TELE-HEALTH VISIT - PLEASE REVIEW  I hereby voluntarily request, consent and authorize [facility] and its employed or contracted physicians, physician assistants, nurse practitioners or other licensed health care professionals (the Practitioner), to provide me with telemedicine health care services (the "Services") as deemed necessary by the treating Practitioner. I acknowledge and consent to receive the Services by the Practitioner via telemedicine. I understand that the telemedicine visit will involve communicating with the Practitioner through live audiovisual communication technology and the disclosure of certain medical information by electronic transmission. I acknowledge that I have been given the opportunity to request an in-person assessment or other available alternative prior to the telemedicine visit and am voluntarily participating in the telemedicine visit.  I understand that I have the right to withhold or withdraw my consent to the use of  telemedicine in the course of my care at any time, without affecting my right to future care or treatment, and that the Practitioner or I may terminate the telemedicine visit at any time. I understand that I have the right to inspect all information obtained and/or recorded in the course of the telemedicine visit and may receive copies of available information for a reasonable fee.  I understand that some of the potential risks of receiving the Services via telemedicine include:  [name] Delay or interruption in medical evaluation due to technological equipment failure or disruption; . Information transmitted may not be sufficient (e.g. poor resolution of images) to allow for appropriate medical decision making by the Practitioner; and/or  . In rare instances, security protocols could fail, causing a breach of personal health information.  Furthermore, I acknowledge that it is my responsibility to provide information about my medical history, conditions and care that is complete and accurate to the best of my ability. I acknowledge that Practitioner's advice, recommendations, and/or decision may be based on factors not within their control, such as incomplete or inaccurate data provided by me or distortions of diagnostic images or specimens that may result from electronic transmissions. I understand that the practice of medicine is not an exact science and that Practitioner makes no warranties or guarantees regarding treatment outcomes. I acknowledge that I will receive a copy of this consent concurrently upon execution via email to the email address I last provided but may also request a printed copy by calling the office of [facility].    I understand that my insurance will be billed for this visit.   I have read or had this consent read to me. [name]  I understand the contents of this consent, which adequately explains the benefits and risks of the Services being provided via telemedicine.  . I have been  provided ample opportunity to ask questions regarding this consent and the Services and have had my questions answered to my satisfaction. . I give my informed consent for the services to be provided through the use of telemedicine in my medical care  By participating in this telemedicine visit I agree to the above.

## 2018-11-23 NOTE — Telephone Encounter (Signed)
Follow up   Patient is expecting a call to go over instructions about the virtual visit. Please contact the patient to discuss.

## 2018-11-26 ENCOUNTER — Telehealth (INDEPENDENT_AMBULATORY_CARE_PROVIDER_SITE_OTHER): Payer: Medicare Other | Admitting: Physician Assistant

## 2018-11-26 ENCOUNTER — Encounter: Payer: Self-pay | Admitting: Physician Assistant

## 2018-11-26 ENCOUNTER — Telehealth: Payer: Self-pay | Admitting: *Deleted

## 2018-11-26 VITALS — BP 104/80 | HR 92 | Ht 62.5 in | Wt 230.0 lb

## 2018-11-26 DIAGNOSIS — N183 Chronic kidney disease, stage 3 unspecified: Secondary | ICD-10-CM

## 2018-11-26 DIAGNOSIS — I130 Hypertensive heart and chronic kidney disease with heart failure and stage 1 through stage 4 chronic kidney disease, or unspecified chronic kidney disease: Secondary | ICD-10-CM

## 2018-11-26 DIAGNOSIS — I5032 Chronic diastolic (congestive) heart failure: Secondary | ICD-10-CM

## 2018-11-26 DIAGNOSIS — I110 Hypertensive heart disease with heart failure: Secondary | ICD-10-CM

## 2018-11-26 DIAGNOSIS — G4733 Obstructive sleep apnea (adult) (pediatric): Secondary | ICD-10-CM

## 2018-11-26 NOTE — Telephone Encounter (Signed)
SPOKE WITH PT WHO IS AWARE OF LAB WORK WITH THE REQUEST OF IRON LAB DRAW ALSO.  PT AGREED TO COME IN OFFICE WED FOR LAB WORK. PT STATED SHE WILL NEED A CALL BACK TO SEE ABOUT FOLLOW UP DATE WITH HER CALENDAR

## 2018-11-26 NOTE — Telephone Encounter (Signed)
Virtual Visit Pre-Appointment Phone Call  Steps For Call:  1. Confirm consent - "In the setting of the current Covid19 crisis, you are scheduled for a (phone or video) visit with your provider on (date) at (time).  Just as we do with many in-office visits, in order for you to participate in this visit, we must obtain consent.  If you'd like, I can send this to your mychart (if signed up) or email for you to review.  Otherwise, I can obtain your verbal consent now.  All virtual visits are billed to your insurance company just like a normal visit would be.  By agreeing to a virtual visit, we'd like you to understand that the technology does not allow for your provider to perform an examination, and thus may limit your provider's ability to fully assess your condition.  Finally, though the technology is pretty good, we cannot assure that it will always work on either your or our end, and in the setting of a video visit, we may have to convert it to a phone-only visit.  In either situation, we cannot ensure that we have a secure connection.  Are you willing to proceed?"  2. Advise patient to be prepared with any vital sign or heart rhythm information, their current medicines, and a piece of paper and pen handy for any instructions they may receive the day of their visit  3. Inform patient they will receive a phone call 15 minutes prior to their appointment time for a nurse to call and verify medicines and allergies so they should be prepared to answer  4. Confirm that appointment type is correct in Epic appointment notes (video vs telephone)    TELEPHONE CALL NOTE  Megan [name] has been deemed a candidate for a follow-up tele-health visit to limit community exposure during the Covid-19 pandemic. I spoke with the patient via phone to ensure availability of phone/video source, confirm preferred email & phone number, and discuss instructions and expectations.  I reminded Megan [name] to be  prepared with any vital sign and/or heart rhythm information that could potentially be obtained via home monitoring, at the time of her visit. I reminded Megan [name] to expect a phone call at the time of her visit if her visit.  Did the patient verbally acknowledge consent to treatment?  Megan [name], [name] 11/23/2018 12:35 PM   CONSENT FOR TELE-HEALTH VISIT - PLEASE REVIEW  I hereby voluntarily request, consent and authorize [facility] and its employed or contracted physicians, physician assistants, nurse practitioners or other licensed health care professionals (the Practitioner), to provide me with telemedicine health care services (the "Services") as deemed necessary by the treating Practitioner.I acknowledge and consent to receive the Services by the Practitioner via telemedicine. I understand that the telemedicine visit will involve communicating with the Practitioner through live audiovisual communication technology and the disclosure of certain medical information by electronic transmission. I acknowledge that I have been given the opportunity to request an in-person assessment or other available alternative prior to the telemedicine visit and am voluntarily participating in the telemedicine visit.  I understand that I have the right to withhold or withdraw my consent to the use of telemedicine in the course of my care at any time, without affecting my right to future care or treatment, and that the Practitioner or I may terminate the telemedicine visit at any time. I understand that I have the right to inspect all information obtained  and/or recorded in the course of the telemedicine visit and may receive copies of available information for a reasonable fee.  I understand that some of thepotential risksof receiving the Services via telemedicine include:  Delay or interruption in medical evaluation due to technological equipment failure or disruption;  Information  transmitted may not be sufficient (e.g. poor resolution of images) to allow for appropriate medical decision making by the Practitioner; and/or   In rare instances, security protocols could fail, causing a breach of personal health information.  Furthermore, I acknowledge that it is my responsibility to provide information about my medical history, conditions and care that is complete and accurate to the best of my ability. I acknowledge that Practitioner's advice, recommendations, and/or decision may be based on factors not within their control, such as incomplete or inaccurate data provided by me or distortions of diagnostic images or specimens that may result from electronic transmissions. I understand that the practice of medicine is not an exact science and that Practitioner makes no warranties or guarantees regarding treatment outcomes. I acknowledge that I will receive a copy of this consent concurrently upon execution via email to the email address I last provided but may also request a printed copy by calling the office of [facility].    I understand that my insurance will be billed for this visit.   I have read or had this consent read to me.  I understand the contents of this consent, which adequately explains the benefits and risks of the Services being provided via telemedicine.   I have been provided ample opportunity to ask questions regarding this consent and the Services and have had my questions answered to my satisfaction.  I give my informed consent for the services to be provided through the use of telemedicine in my medical care  By participating in this telemedicine visit I agree to the above.

## 2018-11-26 NOTE — Progress Notes (Signed)
Virtual Visit via Telephone Note    Evaluation Performed:  Follow-up visit  This visit type was conducted due to national recommendations for restrictions regarding the COVID-19 Pandemic (e.g. social distancing).  This format is felt to be most appropriate for this patient at this time.  All issues noted in this document were discussed and addressed.  No physical exam was performed (except for noted visual exam findings with Video Visits).  Please refer to the patient's chart (MyChart message for video visits and phone note for telephone visits) for the patient's consent to telehealth for [facility].  Date:  11/26/2018   ID:  Megan [name], DOB 1945/12/19, MRN [id number]  Patient Location:  Home  Provider location:   Home  PCP:  [name], MD  Cardiologist:  Mertie Moores, MD   Electrophysiologist:  None  Sleep:  Dr. Brett Fairy  Chief Complaint: Follow-up on congestive heart failure  History of Present Illness:    Megan [name] is a 73 y.o. female who presents via audio/video conferencing for a telehealth visit today.    She has chronic diastolic heart failure, peripheral edema, sleep apnea, hypertension, diabetes, hyperlipidemia chronic kidney disease.  She was admitted in January 2020 with acute diastolic heart failure with anasarca.  Hospitalization was complicated by AKI on chronic kidney disease.  She established with Dr. Acie Fredrickson in February 2020.  She was on high-dose furosemide without much benefit.  He switched her to torsemide.  She did have Unna boots to manage venous stasis ulcers.  She is now using compression stockings.  Today, she notes she is doing fair.  Her legs are still swollen but improved.  She has what sounds like stasis changes.  She still has dyspnea on exertion but this is somewhat improved.  She has not had chest pain, syncope, paroxysmal nocturnal dyspnea, orthopnea.  She is not as active since her husband died.  She has been having a  significantly hard time getting compression stockings on her legs.    The patient does not symptoms concerning for COVID-19 infection (fever, chills, cough, or new shortness of breath).    Prior CV studies:   The following studies were reviewed today:  Echocardiogram 09/09/2018 Moderate concentric LVH, EF 55-60, dynamic obstruction at rest with peak velocity 157 cm/s; peak gradient 10 mmHg, normal wall motion, abnormal relaxation (grade 1 diastolic dysfunction), trivial TR, PASP 40, small pericardial effusion  Echocardiogram 03/13/2015 Mild focal basal septal hypertrophy, vigorous systolic function, EF 50-53, no dynamic obstruction, normal wall motion, grade 1 diastolic dysfunction, trivial AI  Past Medical History:  Diagnosis Date   Allergy    bee stings   Anxiety    Asthmatic bronchitis    Broken arm 1957/2008   right   Cataract    had surgery    Depression    Diabetes mellitus without complication ([place])    prediabetes diet controlled   Diverticulosis    GERD (gastroesophageal reflux disease)    Glaucoma    BIL   Hx of adenomatous colonic polyps 12/02/2010   Hyperlipidemia    Hypertension    Hypothyroidism 05/2018   Inflammatory arthritis    Knee bursitis    Migraines    Obesity    Osteoarthritis    Sleep apnea    Past Surgical History:  Procedure Laterality Date   CARPAL TUNNEL RELEASE Left    left wrist   CATARACT EXTRACTION W/ INTRAOCULAR LENS  IMPLANT, BILATERAL Bilateral    1999 left, 2008 right  COLONOSCOPY     EXCISION MORTON'S NEUROMA Left 1978   left foot   GLAUCOMA SURGERY Bilateral    and laser surgery, 2004 left, 2008 right   PARTIAL HYSTERECTOMY  1991   Left an ovary   POLYPECTOMY     TOENAIL AVULSION Right    big toe     Current Meds  Medication Sig   Acetaminophen (TYLENOL ARTHRITIS PAIN PO) Take 650 mg by mouth as needed (pain).    levothyroxine (SYNTHROID, LEVOTHROID) 50 MCG tablet Take 50 mcg by mouth daily  before breakfast.   metFORMIN (GLUCOPHAGE) 500 MG tablet Take 500 mg by mouth 2 (two) times daily with a meal.    metoprolol tartrate (LOPRESSOR) 25 MG tablet Take 1 tablet (25 mg total) by mouth 2 (two) times daily.   Multiple Vitamin (MULTIVITAMIN WITH MINERALS) TABS tablet Take 1 tablet by mouth daily.   OLANZapine (ZYPREXA) 2.5 MG tablet Take 1 tablet (2.5 mg total) by mouth as needed (sad).   Omeprazole-Sodium Bicarbonate (ZEGERID) 20-1100 MG CAPS capsule Take 1 capsule by mouth at bedtime.   pantoprazole (PROTONIX) 40 MG tablet TAKE 1 TABLET BY MOUTH  DAILY BEFORE BREAKFAST   polyvinyl alcohol (LIQUIFILM TEARS) 1.4 % ophthalmic solution Place 1 drop into both eyes as needed for dry eyes.   potassium chloride SA (K-DUR,KLOR-CON) 20 MEQ tablet Take 40 mEq by mouth daily.   simvastatin (ZOCOR) 20 MG tablet Take 20 mg by mouth daily at 6 PM.    torsemide (DEMADEX) 100 MG tablet Take 0.5 tablets (50 mg total) by mouth 2 (two) times daily.   VITAMIN E PO Take 1 tablet by mouth daily.   [DISCONTINUED] potassium chloride SA (K-DUR,KLOR-CON) 20 MEQ tablet Take 2 tablets (40 mEq total) by mouth 2 (two) times daily. (Patient taking differently: Take 40 mEq by mouth daily. )     Allergies:   Aspirin; Bee pollen; Codeine; Fish oil; Sulfa antibiotics; Camphor; Camphor; Lisinopril; Penicillins; Sulfasalazine; and Vicodin [hydrocodone-acetaminophen]   Social History   Tobacco Use   Smoking status: Former Smoker    Types: Cigarettes    Last attempt to quit: 08/30/1971    Years since quitting: 47.2   Smokeless tobacco: Never Used  Substance Use Topics   Alcohol use: No    Alcohol/week: 0.0 standard drinks   Drug use: No     Family Hx: The patient's family history includes Asthma in her brother; Bone cancer in her maternal grandfather; Colon polyps in her maternal grandfather; Diabetes in her brother, father, mother, and sister; Hypertension in her father and mother; Obesity in an  other family member. There is no history of Rectal cancer, Stomach cancer, or Esophageal cancer.  ROS:   Please see the history of present illness.    She notes intention tremors.  All other systems reviewed and are negative.   Labs/Other Tests and Data Reviewed:    Recent Labs: 09/08/2018: B Natriuretic Peptide 112.0 09/09/2018: ALT 8; TSH 3.265 09/14/2018: Hemoglobin 8.9; Platelets 258 09/15/2018: Magnesium 1.9 11/05/2018: BUN 29; Creatinine, Ser 1.29; Potassium 4.5; Sodium 145   Recent Lipid Panel No results found for: CHOL, TRIG, HDL, CHOLHDL, LDLCALC, LDLDIRECT   From [facility]    Wt Readings from Last 3 Encounters:  11/26/18 230 lb (104.3 kg)  10/30/18 246 lb (111.6 kg)  10/15/18 256 lb (116.1 kg)     Exam:    Vital Signs:  BP 104/80    Pulse 92    Ht 5' 2.5" (1.588 m)  Wt 230 lb (104.3 kg)    BMI 41.40 kg/m     ASSESSMENT & PLAN:    Chronic diastolic congestive heart failure ([place]) EF 55-60 by echo in 08/2018.  She has moderate LVH as well as dynamic obstruction at rest.  However, peak gradient is only 10 mmHg.  Her breathing seems to be slightly improved with taking torsemide 50 mg twice daily.  Her weight has gone down significantly from 256 pounds to 230 pounds.  Most recent lab work demonstrated stable creatinine and normal potassium.  Since she has continued to lose weight, I believe it is important that we recheck her basic metabolic panel.  It sounds as though she still has significant amounts of swelling.  Therefore, I would not want to decrease her diuretic unless her creatinine is increasing.  As we are trying to limit patient exposure during COVID-19, I will try to either arrange home health to draw her lab or bring her in for discrete lab draw at our office.  We will also send in a new prescription for compression hose at a lower strength.  Plan follow-up telephone visit in 6 weeks.  Hypertensive heart disease with chronic diastolic congestive heart failure  ([place]) The patient's blood pressure is controlled on her current regimen.  Continue current therapy.   CKD (chronic kidney disease) stage 3, GFR 30-59 ml/min (HCC) As noted, recent creatinine stable.  Arrange follow-up BMET.   COVID-19 Education: The signs and symptoms of COVID-19 were discussed with the patient and how to seek care for testing (follow up with PCP or arrange E-visit).  The importance of social distancing was discussed today.  Patient Risk:   After full review of this patients clinical status, I feel that they are at least moderate risk at this time.  Time:   Today, I have spent 19 minutes with the patient with telehealth technology discussing the above problems.     Medication Adjustments/Labs and Tests Ordered: Current medicines are reviewed at length with the patient today.  Concerns regarding medicines are outlined above.  Tests Ordered: No orders of the defined types were placed in this encounter.  Medication Changes: No orders of the defined types were placed in this encounter.   Disposition:  Follow up in 6 week(s)  Signed, Richardson Dopp, PA-C  11/26/2018 2:23 PM    [facility]

## 2018-11-26 NOTE — Patient Instructions (Signed)
Medication Instructions:  No changes.  See medication list.  If you need a refill on your cardiac medications before your next appointment, please call your pharmacy.   Lab work:  Psychologist, clinical this week.  If you have labs (blood work) drawn today and your tests are completely normal, you will receive your results only by: [name] MyChart Message (if you have MyChart) OR . A paper copy in the mail If you have any lab test that is abnormal or we need to change your treatment, we will call you to review the results.  Testing/Procedures: None  Follow-Up: At [facility], you and your health needs are our priority.  As part of our continuing mission to provide you with exceptional heart care, we have created designated Provider Care Teams.  These Care Teams include your primary Cardiologist (physician) and Advanced Practice Providers (APPs -  Physician Assistants and Nurse Practitioners) who all work together to provide you with the care you need, when you need it. . Telephone visit with Megan Dopp, PA-C in 6 weeks  Any Other Special Instructions Will Be Listed Below (If Applicable). Weigh daily Call if weight increases 3 pounds or more in 1 day.

## 2018-11-28 ENCOUNTER — Telehealth: Payer: Self-pay | Admitting: *Deleted

## 2018-11-28 ENCOUNTER — Other Ambulatory Visit: Payer: Self-pay

## 2018-11-28 ENCOUNTER — Other Ambulatory Visit: Payer: Medicare Other | Admitting: *Deleted

## 2018-11-28 DIAGNOSIS — G4733 Obstructive sleep apnea (adult) (pediatric): Secondary | ICD-10-CM

## 2018-11-28 DIAGNOSIS — I5032 Chronic diastolic (congestive) heart failure: Secondary | ICD-10-CM

## 2018-11-28 LAB — CBC
Hematocrit: 35.5 % (ref 34.0–46.6)
Hemoglobin: 11.4 g/dL (ref 11.1–15.9)
MCH: 26.8 pg (ref 26.6–33.0)
MCHC: 32.1 g/dL (ref 31.5–35.7)
MCV: 84 fL (ref 79–97)
Platelets: 401 10*3/uL (ref 150–450)
RBC: 4.25 x10E6/uL (ref 3.77–5.28)
RDW: 15 % (ref 11.7–15.4)
WBC: 9.4 10*3/uL (ref 3.4–10.8)

## 2018-11-28 LAB — BASIC METABOLIC PANEL
BUN/Creatinine Ratio: 21 (ref 12–28)
BUN: 25 mg/dL (ref 8–27)
CO2: 25 mmol/L (ref 20–29)
Calcium: 7.7 mg/dL — ABNORMAL LOW (ref 8.7–10.3)
Chloride: 99 mmol/L (ref 96–106)
Creatinine, Ser: 1.17 mg/dL — ABNORMAL HIGH (ref 0.57–1.00)
GFR calc Af Amer: 53 mL/min/{1.73_m2} — ABNORMAL LOW (ref >59)
GFR calc non Af Amer: 46 mL/min/{1.73_m2} — ABNORMAL LOW (ref >59)
Glucose: 119 mg/dL — ABNORMAL HIGH (ref 65–99)
Potassium: 4 mmol/L (ref 3.5–5.2)
Sodium: 144 mmol/L (ref 134–144)

## 2018-11-28 NOTE — Telephone Encounter (Signed)
LMOVM OF WEAVER RECOMMENDATIONS DUE TO PT NOT  WANTING STOCKINGS MAILED TO HOME.   INQUIRE WITH  MEDICAL SUPPLY STORE FIRST TO SEE IF THEY ARE USING SOCIAL DISTANCING.  IF NOT AVOID WEARING COMPRESSION HOSE FOR 2-3 MONTHS AFTER THIS VIRUS CRISIS HAS CALMED DOWN.  CLINIC NUMBER WAS LEFT IF NEEDED TO CONTACT BACK.

## 2018-11-28 NOTE — Telephone Encounter (Signed)
Follow up:     Patient returning a call back. Please call patient back 

## 2018-12-03 ENCOUNTER — Inpatient Hospital Stay: Payer: Medicare Other | Admitting: Hematology

## 2018-12-03 ENCOUNTER — Inpatient Hospital Stay: Payer: Medicare Other

## 2018-12-05 ENCOUNTER — Ambulatory Visit: Payer: Medicare Other | Admitting: Podiatry

## 2018-12-31 ENCOUNTER — Other Ambulatory Visit: Payer: Self-pay

## 2018-12-31 ENCOUNTER — Other Ambulatory Visit: Payer: Self-pay | Admitting: Family Medicine

## 2018-12-31 ENCOUNTER — Ambulatory Visit
Admission: RE | Admit: 2018-12-31 | Discharge: 2018-12-31 | Disposition: A | Discharge status: Home / Self Care | Payer: Medicare Other | Source: Ambulatory Visit | Attending: Family Medicine | Admitting: Family Medicine

## 2018-12-31 DIAGNOSIS — M545 Low back pain, unspecified: Secondary | ICD-10-CM

## 2019-01-06 NOTE — Progress Notes (Signed)
Virtual Visit via Telephone Note   This visit type was conducted due to national recommendations for restrictions regarding the COVID-19 Pandemic (e.g. social distancing) in an effort to limit this patient's exposure and mitigate transmission in our community.  Due to her co-morbid illnesses, this patient is at least at moderate risk for complications without adequate follow up.  This format is felt to be most appropriate for this patient at this time.  The patient did not have access to video technology/had technical difficulties with video requiring transitioning to audio format only (telephone).  All issues noted in this document were discussed and addressed.  No physical exam could be performed with this format.  Please refer to the patient's chart for her  consent to telehealth for [facility].   Date:  01/07/2019   ID:  Megan [name], DOB [birth year], MRN [id number]  Patient Location: Home Provider Location: Home  PCP:  [name], MD  Cardiologist:  Mertie Moores, MD  Electrophysiologist:  None  Sleep:  Dr. Brett Fairy  Evaluation Performed:  Follow-Up Visit  Chief Complaint: Follow-up on CHF  History of Present Illness:    Megan [name] is a 73 y.o. female with chronic diastolic heart failure, peripheral edema, sleep apnea, hypertension, diabetes, hyperlipidemia chronic kidney disease.  She was admitted in January 2020 with acute diastolic heart failure with anasarca.  Hospitalization was complicated by AKI on chronic kidney disease.  She established with Dr. Acie Fredrickson in February 2020.  She was on high-dose furosemide without much benefit.  He switched her to torsemide.  She did have Unna boots to manage venous stasis ulcers.  She was last seen in 10/2018.    Today, she notes she is doing well.  She has continued to lose weight.  Since February, she is lost 44 pounds.  She has not had any swelling in her legs.  She has not had chest discomfort.  She has not had syncope.   She struggles with low back pain and is scheduled to see an orthopedist soon.  She has had a couple of episodes of lightheadedness with standing where she had to sit right back down.  The patient does not have symptoms concerning for COVID-19 infection (fever, chills, cough, or new shortness of breath).    Past Medical History:  Diagnosis Date  . Allergy    bee stings  . Anxiety   . Asthmatic bronchitis   . Broken arm 1957/2008   right  . Cataract    had surgery   . Depression   . Diabetes mellitus without complication ([place])    prediabetes diet controlled  . Diverticulosis   . GERD (gastroesophageal reflux disease)   . Glaucoma    BIL  . Hx of adenomatous colonic polyps 12/02/2010  . Hyperlipidemia   . Hypertension   . Hypothyroidism 05/2018  . Inflammatory arthritis   . Knee bursitis   . Migraines   . Obesity   . Osteoarthritis   . Sleep apnea    Past Surgical History:  Procedure Laterality Date  . CARPAL TUNNEL RELEASE Left    left wrist  . CATARACT EXTRACTION W/ INTRAOCULAR LENS  IMPLANT, BILATERAL Bilateral    1999 left, 2008 right  . COLONOSCOPY    . EXCISION MORTON'S NEUROMA Left 1978   left foot  . GLAUCOMA SURGERY Bilateral    and laser surgery, 2004 left, 2008 right  . PARTIAL HYSTERECTOMY  1991   Left an ovary  . POLYPECTOMY    .  TOENAIL AVULSION Right    big toe     Current Meds  Medication Sig  . Acetaminophen (TYLENOL ARTHRITIS PAIN PO) Take 650 mg by mouth as needed (pain).   [name] levothyroxine (SYNTHROID, LEVOTHROID) 50 MCG tablet Take 50 mcg by mouth daily before breakfast.  . metFORMIN (GLUCOPHAGE) 500 MG tablet Take 500 mg by mouth 2 (two) times daily with a meal.   . metoprolol tartrate (LOPRESSOR) 25 MG tablet Take 1 tablet (25 mg total) by mouth 2 (two) times daily.  . Multiple Vitamin (MULTIVITAMIN WITH MINERALS) TABS tablet Take 1 tablet by mouth daily.  [name] OLANZapine (ZYPREXA) 2.5 MG tablet Take 1 tablet (2.5 mg total) by mouth as needed  (sad).  Earney Navy Bicarbonate (ZEGERID) 20-1100 MG CAPS capsule Take 1 capsule by mouth at bedtime.  Glory Rosebush VERIO test strip USE TO TEST BLOOD GLUCOSE ONCE DAILY  . pantoprazole (PROTONIX) 40 MG tablet TAKE 1 TABLET BY MOUTH  DAILY BEFORE BREAKFAST  . polyvinyl alcohol (LIQUIFILM TEARS) 1.4 % ophthalmic solution Place 1 drop into both eyes as needed for dry eyes.  . potassium chloride SA (K-DUR,KLOR-CON) 20 MEQ tablet Take 40 mEq by mouth daily.  . simvastatin (ZOCOR) 20 MG tablet Take 20 mg by mouth daily at 6 PM.   . tiZANidine (ZANAFLEX) 2 MG tablet Patient take 0.5 mg tablet by mouth as needed for muscle spasms  . VITAMIN E PO Take 1 tablet by mouth daily.  . [DISCONTINUED] torsemide (DEMADEX) 100 MG tablet Take 0.5 tablets (50 mg total) by mouth 2 (two) times daily.     Allergies:   Aspirin; Bee pollen; Codeine; Fish oil; Sulfa antibiotics; Camphor; Camphor; Lisinopril; Penicillins; Sulfasalazine; and Vicodin [hydrocodone-acetaminophen]   Social History   Tobacco Use  . Smoking status: Former Smoker    Types: Cigarettes    Last attempt to quit: 08/30/1971    Years since quitting: 47.3  . Smokeless tobacco: Never Used  Substance Use Topics  . Alcohol use: No    Alcohol/week: 0.0 standard drinks  . Drug use: No     Family Hx: The patient's family history includes Asthma in her brother; Bone cancer in her maternal grandfather; Colon polyps in her maternal grandfather; Diabetes in her brother, father, mother, and sister; Hypertension in her father and mother; Obesity in an other family member. There is no history of Rectal cancer, Stomach cancer, or Esophageal cancer.  ROS:   Please see the history of present illness.    All other systems reviewed and are negative.   Prior CV studies:   The following studies were reviewed today:  Echocardiogram 09/09/2018 Moderate concentric LVH, EF 55-60, dynamic obstruction at rest with peak velocity 157 cm/s; peak gradient 10  mmHg, normal wall motion, abnormal relaxation (grade 1 diastolic dysfunction), trivial TR, PASP 40, small pericardial effusion  Echocardiogram 03/13/2015 Mild focal basal septal hypertrophy, vigorous systolic function, EF 38-75, no dynamic obstruction, normal wall motion, grade 1 diastolic dysfunction, trivial AI  Labs/Other Tests and Data Reviewed:    EKG:  No ECG reviewed.  Recent Labs: 09/08/2018: B Natriuretic Peptide 112.0 09/09/2018: ALT 8; TSH 3.265 09/15/2018: Magnesium 1.9 11/28/2018: BUN 25; Creatinine, Ser 1.17; Hemoglobin 11.4; Platelets 401; Potassium 4.0; Sodium 144   Recent Lipid Panel No results found for: CHOL, TRIG, HDL, CHOLHDL, LDLCALC, LDLDIRECT     Wt Readings from Last 3 Encounters:  01/07/19 212 lb 8 oz (96.4 kg)  11/26/18 230 lb (104.3 kg)  10/30/18 246 lb (111.6 kg)  Objective:    Vital Signs:  BP 105/68   Pulse 96   Ht 5' 2.5" (1.588 m)   Wt 212 lb 8 oz (96.4 kg)   BMI 38.25 kg/m    VITAL SIGNS:  reviewed GEN:  no acute distress RESPIRATORY:  No labored breathing NEURO:  Alert and oriented PSYCH:  She seems to be in good spirits  ASSESSMENT & PLAN:    Chronic diastolic congestive heart failure ([place]) Overall improved.  Her weight is down 44 pounds since February.  She notes she no longer has swelling in her legs.  She has had a couple of episodes of lightheadedness with standing.  At this point, I think we can cut back on her diuretic.  I have recommended that she decrease torsemide to 50 mg once daily.  We discussed the importance of daily weights and when to take an extra dose of torsemide.  Follow-up in 3 months.  CKD (chronic kidney disease) stage 3, GFR 30-59 ml/min (HCC) Recent creatinine stable.  Hypertensive heart disease with chronic diastolic congestive heart failure ([place]) The patient's blood pressure is controlled on her current regimen.  Continue current therapy.   COVID-19 Education: The signs and symptoms of COVID-19 were  discussed with the patient and how to seek care for testing (follow up with PCP or arrange E-visit).  The importance of social distancing was discussed today.  Time:   Today, I have spent 13 minutes with the patient with telehealth technology discussing the above problems.     Medication Adjustments/Labs and Tests Ordered: Current medicines are reviewed at length with the patient today.  Concerns regarding medicines are outlined above.   Tests Ordered: No orders of the defined types were placed in this encounter.   Medication Changes: Meds ordered this encounter  Medications  . torsemide (DEMADEX) 100 MG tablet    Sig: Take 0.5 tablets (50 mg total) by mouth daily.    Dispense:  45 tablet    Refill:  3    Dose decreased    Order Specific Question:   Supervising Provider    Answer:   Lelon Perla [[id number]]    Disposition:  Follow up in 3 month(s)  Signed, Richardson Dopp, PA-C  01/07/2019 11:53 AM    [place]

## 2019-01-07 ENCOUNTER — Encounter: Payer: Self-pay | Admitting: Physician Assistant

## 2019-01-07 ENCOUNTER — Telehealth (INDEPENDENT_AMBULATORY_CARE_PROVIDER_SITE_OTHER): Payer: Medicare Other | Admitting: Physician Assistant

## 2019-01-07 ENCOUNTER — Other Ambulatory Visit: Payer: Self-pay

## 2019-01-07 VITALS — BP 105/68 | HR 96 | Ht 62.5 in | Wt 212.5 lb

## 2019-01-07 DIAGNOSIS — N183 Chronic kidney disease, stage 3 unspecified: Secondary | ICD-10-CM

## 2019-01-07 DIAGNOSIS — I5032 Chronic diastolic (congestive) heart failure: Secondary | ICD-10-CM

## 2019-01-07 DIAGNOSIS — I110 Hypertensive heart disease with heart failure: Secondary | ICD-10-CM

## 2019-01-07 DIAGNOSIS — Z7189 Other specified counseling: Secondary | ICD-10-CM

## 2019-01-07 MED ORDER — TORSEMIDE 100 MG PO TABS: 50.0000 mg | ORAL_TABLET | Freq: Every day | ORAL | 3 refills | Status: DC

## 2019-01-07 NOTE — Patient Instructions (Signed)
Medication Instructions:  Decrease torsemide to 50 mg once daily  If you need a refill on your cardiac medications before your next appointment, please call your pharmacy.   Lab work: None  If you have labs (blood work) drawn today and your tests are completely normal, you will receive your results only by: [name] MyChart Message (if you have MyChart) OR . A paper copy in the mail If you have any lab test that is abnormal or we need to change your treatment, we will call you to review the results.  Testing/Procedures: None  Follow-Up: At [facility], you and your health needs are our priority.  As part of our continuing mission to provide you with exceptional heart care, we have created designated Provider Care Teams.  These Care Teams include your primary Cardiologist (physician) and Advanced Practice Providers (APPs -  Physician Assistants and Nurse Practitioners) who all work together to provide you with the care you need, when you need it. You will need a follow up appointment in:  3 months.  Please call our office 2 months in advance to schedule this appointment.  You may see Mertie Moores, MD or Richardson Dopp, PA-C   Any Other Special Instructions Will Be Listed Below (If Applicable).  Weight yourself daily. If your weight increases 3 pounds or more in 1 day or you notice significant swelling in your legs, take an extra 50 mg of torsemide that day and call us.

## 2019-01-08 ENCOUNTER — Emergency Department (HOSPITAL_COMMUNITY): Payer: Medicare Other

## 2019-01-08 ENCOUNTER — Other Ambulatory Visit: Payer: Self-pay

## 2019-01-08 ENCOUNTER — Emergency Department (HOSPITAL_COMMUNITY)
Admission: EM | Admit: 2019-01-08 | Discharge: 2019-01-11 | Disposition: A | Discharge status: Other Acute Inpatient Hospital | Payer: Medicare Other | Attending: Emergency Medicine | Admitting: Emergency Medicine

## 2019-01-08 DIAGNOSIS — Y9301 Activity, walking, marching and hiking: Secondary | ICD-10-CM | POA: Insufficient documentation

## 2019-01-08 DIAGNOSIS — Z1159 Encounter for screening for other viral diseases: Secondary | ICD-10-CM | POA: Diagnosis not present

## 2019-01-08 DIAGNOSIS — T07XXXA Unspecified multiple injuries, initial encounter: Secondary | ICD-10-CM | POA: Diagnosis present

## 2019-01-08 DIAGNOSIS — Y929 Unspecified place or not applicable: Secondary | ICD-10-CM | POA: Diagnosis not present

## 2019-01-08 DIAGNOSIS — Y999 Unspecified external cause status: Secondary | ICD-10-CM | POA: Insufficient documentation

## 2019-01-08 DIAGNOSIS — M549 Dorsalgia, unspecified: Secondary | ICD-10-CM | POA: Diagnosis not present

## 2019-01-08 DIAGNOSIS — W109XXA Fall (on) (from) unspecified stairs and steps, initial encounter: Secondary | ICD-10-CM | POA: Insufficient documentation

## 2019-01-08 DIAGNOSIS — M79604 Pain in right leg: Secondary | ICD-10-CM | POA: Diagnosis not present

## 2019-01-08 DIAGNOSIS — W19XXXA Unspecified fall, initial encounter: Secondary | ICD-10-CM

## 2019-01-08 DIAGNOSIS — M25572 Pain in left ankle and joints of left foot: Secondary | ICD-10-CM | POA: Diagnosis not present

## 2019-01-08 DIAGNOSIS — S8251XA Displaced fracture of medial malleolus of right tibia, initial encounter for closed fracture: Secondary | ICD-10-CM | POA: Insufficient documentation

## 2019-01-08 DIAGNOSIS — S82891A Other fracture of right lower leg, initial encounter for closed fracture: Secondary | ICD-10-CM

## 2019-01-08 HISTORY — DX: Unspecified hemorrhoids: K64.9

## 2019-01-08 MED ORDER — POTASSIUM CHLORIDE CRYS ER 20 MEQ PO TBCR
40.0000 meq | EXTENDED_RELEASE_TABLET | Freq: Every day | ORAL | Status: DC
  Administered 2019-01-08 – 2019-01-11 (×4): 40 meq via ORAL
  Filled 2019-01-08 (×4): qty 2

## 2019-01-08 MED ORDER — METFORMIN HCL 500 MG PO TABS
500.0000 mg | ORAL_TABLET | Freq: Two times a day (BID) | ORAL | Status: DC
  Administered 2019-01-09 – 2019-01-11 (×4): 500 mg via ORAL
  Filled 2019-01-08 (×4): qty 1

## 2019-01-08 MED ORDER — ACETAMINOPHEN 325 MG PO TABS
650.0000 mg | ORAL_TABLET | Freq: Once | ORAL | Status: AC
  Administered 2019-01-08: 15:00:00 650 mg via ORAL
  Filled 2019-01-08: qty 2

## 2019-01-08 MED ORDER — SIMVASTATIN 20 MG PO TABS
20.0000 mg | ORAL_TABLET | Freq: Every day | ORAL | Status: DC
  Administered 2019-01-10: 18:00:00 20 mg via ORAL
  Filled 2019-01-08: qty 1

## 2019-01-08 MED ORDER — ACETAMINOPHEN 325 MG PO TABS
650.0000 mg | ORAL_TABLET | Freq: Once | ORAL | Status: AC
  Administered 2019-01-08: 22:00:00 650 mg via ORAL
  Filled 2019-01-08: qty 2

## 2019-01-08 NOTE — ED Provider Notes (Signed)
3:28 PM Care assumed from Dr. Alvino Chapel.  At time of transfer care, patient is awaiting results of CT scan to look for tibial plateau fracture.  Patient reportedly had a fall and sustained injuries to both knees and both ankles.  Patient found to have a right ankle fracture and will be placed in a walking boot.  Due to possible fracture in the to plateau on the left side, CT was recommended.  Given pain in both legs inability to ambulate, case management will be called to help with determining patient's ability to go home versus needs for placement.  Will await their recommendations.  CT scan returned showing no evidence of fracture or dislocation in the knee per patient likely has soft tissue or ligamentous injuries.  Patient was in a knee immobilizer on the left and a walking boot on the right.  I reassessed the patient and she still has significant pain with any movement.  She does not feel she will be able to safely walk at her home currently because there are 14 stairs on one side and the stairs she fell down on the other side.  Case management was called to see what assistance patient may be appropriate to receive.  Given the time of evening, patient will need PT/OT evaluation to determine assistance needs and likely SNF placement.  Case management recommended patient remain in the emergency department overnight and be assessed in the morning by PT/OT.  Home medicines we ordered.  Patient will be assessed by PT/OT in the morning. Anticipate following up on the recommendations.  Patient may need placement.      Connery Shiffler, Gwenyth Allegra, MD 01/09/19 Laureen Abrahams

## 2019-01-08 NOTE — ED Triage Notes (Addendum)
Pt here after mechanical fall down three stairs. Pt c/o bilateral ankle pain and R lower leg pain. EMS reports pt unable to bear weight on R leg. Pt reports recent falls and trouble ambulating recently. Denies dizziness prior to fall.

## 2019-01-08 NOTE — Progress Notes (Signed)
Orthopedic Tech Progress Note Patient Details:  Megan [name] 14-Oct-1945 [id number]  Ortho Devices Type of Ortho Device: CAM walker Ortho Device/Splint Location: right Ortho Device/Splint Interventions: Application   Post Interventions Patient Tolerated: Fair Instructions Provided: Care of device   [name] 01/08/2019, 2:51 PM

## 2019-01-08 NOTE — ED Provider Notes (Signed)
[facility] EMERGENCY DEPARTMENT Provider Note   CSN: [id number] Arrival date & time: 01/08/19  1136    History   Chief Complaint Chief Complaint  Patient presents with   Fall    HPI Megan [name] is a 73 y.o. female.     HPI Patient presents after fall.  Was walking down stairs and fell on the last stair.  Complaining of pain in right knee down to right ankle.  Also left ankle.  Not on anticoagulation.  Denies head injury.  Denies other pain.  States she had breakfast but not lunch today.  States she is not able to walk due to the pain.  Patient states she is had more back pain recently and has follow-up to see someone for this.  States that back pain is no worse today than it was previously Past Medical History:  Diagnosis Date   Allergy    bee stings   Anxiety    Asthmatic bronchitis    Broken arm 1957/2008   right   Cataract    had surgery    Depression    Diabetes mellitus without complication ([place])    prediabetes diet controlled   Diverticulosis    GERD (gastroesophageal reflux disease)    Glaucoma    BIL   Hx of adenomatous colonic polyps 12/02/2010   Hyperlipidemia    Hypertension    Hypothyroidism 05/2018   Inflammatory arthritis    Knee bursitis    Migraines    Obesity    Osteoarthritis    Sleep apnea     Patient Active Problem List   Diagnosis Date Noted   CKD (chronic kidney disease) stage 3, GFR 30-59 ml/min ([place]) 11/26/2018   Cellulitis of lower extremity    Diastolic CHF ([place]) 56/81/2751   DM (diabetes mellitus), type 2 with complications ([place]) 70/08/7492   Hypertensive heart disease with CHF (congestive heart failure) ([place]) 09/08/2018   Leg edema 09/08/2018   Blood in stool 09/08/2018   OSA (obstructive sleep apnea) 09/08/2018   Sensorineural hearing loss (SNHL) of both ears 07/31/2017   Lumbar radiculopathy 12/05/2016   Diabetic polyneuropathy associated with diabetes mellitus due to  underlying condition ([place]) 07/09/2015   Vitamin deficiency related neuropathy 07/09/2015   Hx of adenomatous colonic polyps 12/02/2010    Past Surgical History:  Procedure Laterality Date   CARPAL TUNNEL RELEASE Left    left wrist   CATARACT EXTRACTION W/ INTRAOCULAR LENS  IMPLANT, BILATERAL Bilateral    1999 left, 2008 right   COLONOSCOPY     EXCISION MORTON'S NEUROMA Left 1978   left foot   GLAUCOMA SURGERY Bilateral    and laser surgery, 2004 left, 2008 right   PARTIAL HYSTERECTOMY  1991   Left an ovary   POLYPECTOMY     TOENAIL AVULSION Right    big toe     OB History   No obstetric history on file.      Home Medications    Prior to Admission medications   Medication Sig Start Date End Date Taking? Authorizing Provider  Acetaminophen (TYLENOL ARTHRITIS PAIN PO) Take 650 mg by mouth as needed (pain).     [provider]  levothyroxine (SYNTHROID, LEVOTHROID) 50 MCG tablet Take 50 mcg by mouth daily before breakfast.    [provider]  metFORMIN (GLUCOPHAGE) 500 MG tablet Take 500 mg by mouth 2 (two) times daily with a meal.  01/22/18   [provider]  metoprolol tartrate (LOPRESSOR) 25 MG  tablet Take 1 tablet (25 mg total) by mouth 2 (two) times daily. 09/17/18   Nita Sells, MD  Multiple Vitamin (MULTIVITAMIN WITH MINERALS) TABS tablet Take 1 tablet by mouth daily.    [provider]  OLANZapine (ZYPREXA) 2.5 MG tablet Take 1 tablet (2.5 mg total) by mouth as needed (sad). 09/17/18   Nita Sells, MD  Omeprazole-Sodium Bicarbonate (ZEGERID) 20-1100 MG CAPS capsule Take 1 capsule by mouth at bedtime. 12/12/14   Gatha Mayer, MD  [facility] VERIO test strip USE TO TEST BLOOD GLUCOSE ONCE DAILY 12/08/18   [provider]  pantoprazole (PROTONIX) 40 MG tablet TAKE 1 TABLET BY MOUTH  DAILY BEFORE BREAKFAST 08/30/16   Gatha Mayer, MD  polyvinyl alcohol (LIQUIFILM TEARS) 1.4 % ophthalmic solution Place 1  drop into both eyes as needed for dry eyes. 09/17/18   Nita Sells, MD  potassium chloride SA (K-DUR,KLOR-CON) 20 MEQ tablet Take 40 mEq by mouth daily.    [provider]  simvastatin (ZOCOR) 20 MG tablet Take 20 mg by mouth daily at 6 PM.  06/15/16   [provider]  tiZANidine (ZANAFLEX) 2 MG tablet Patient take 0.5 mg tablet by mouth as needed for muscle spasms 01/02/19   [provider]  torsemide (DEMADEX) 100 MG tablet Take 0.5 tablets (50 mg total) by mouth daily. 01/07/19 01/07/20  Richardson Dopp T, PA-C  VITAMIN E PO Take 1 tablet by mouth daily.    [provider]    Family History Family History  Problem Relation Age of Onset   Colon polyps Maternal Grandfather    Bone cancer Maternal Grandfather    Hypertension Mother    Diabetes Mother    Hypertension Father    Diabetes Father    Asthma Brother    Obesity Other    Diabetes Sister        x 2   Diabetes Brother        prediabetes   Rectal cancer Neg Hx    Stomach cancer Neg Hx    Esophageal cancer Neg Hx     Social History Social History   Tobacco Use   Smoking status: Former Smoker    Types: Cigarettes    Last attempt to quit: 08/30/1971    Years since quitting: 47.3   Smokeless tobacco: Never Used  Substance Use Topics   Alcohol use: No    Alcohol/week: 0.0 standard drinks   Drug use: No     Allergies   Aspirin; Bee pollen; Codeine; Fish oil; Sulfa antibiotics; Camphor; Camphor; Lisinopril; Penicillins; Sulfasalazine; and Vicodin [hydrocodone-acetaminophen]   Review of Systems Review of Systems  Constitutional: Negative for appetite change.  Respiratory: Negative for shortness of breath.   Cardiovascular: Negative for chest pain.  Gastrointestinal: Negative for abdominal pain.  Musculoskeletal: Positive for back pain.  Skin: Negative for wound.  Neurological: Positive for weakness.     Physical Exam Updated Vital Signs BP (!) 149/66     Pulse 89    Temp 98.3 F (36.8 C) (Oral)    Resp 20    Ht 5' 2.5" (1.588 m)    Wt 96.4 kg    SpO2 99%    BMI 38.25 kg/m   Physical Exam Vitals signs and nursing note reviewed.  Constitutional:      Appearance: Normal appearance.  HENT:     Head: Atraumatic.  Eyes:     Pupils: Pupils are equal, round, and reactive to light.  Neck:  Musculoskeletal: Neck supple. No neck rigidity.  Pulmonary:     Breath sounds: Normal breath sounds.  Abdominal:     Tenderness: There is no abdominal tenderness.  Musculoskeletal:     Comments: No tenderness to either hip.  No lumbar tenderness.  No upper extremity tenderness.  There is however tenderness to bilateral knees.  Tenderness to most of right tib-fib and on right ankle.  Also tender on right foot.  Tenderness to left knee and left ankle.  Skin intact over lower extremities.  Neurological:     Mental Status: She is oriented to person, place, and time. Mental status is at baseline.      ED Treatments / Results  Labs (all labs ordered are listed, but only abnormal results are displayed) Labs Reviewed - No data to display  EKG None  Radiology Dg Tibia/fibula Right  Result Date: 01/08/2019 CLINICAL DATA:  Initial evaluation for acute trauma, fall. EXAM: RIGHT TIBIA AND FIBULA - 2 VIEW COMPARISON:  None. FINDINGS: No acute fracture dislocation. Osteoarthritic changes noted about the knee and ankle. Mild osteopenia. No acute soft tissue abnormality. IMPRESSION: No acute osseous abnormality about the right tibia/fibula. Electronically Signed   By: Jeannine Boga M.D.   On: 01/08/2019 14:11   Dg Ankle Complete Left  Result Date: 01/08/2019 CLINICAL DATA:  Initial evaluation for acute trauma, fall. EXAM: LEFT ANKLE COMPLETE - 3+ VIEW COMPARISON:  None. FINDINGS: No acute fracture dislocation. Ankle mortise approximated. Talar dome intact. Plantar calcaneal enthesophyte noted. Bones are mildly osteopenic. No acute soft tissue abnormality.  Few scattered vascular calcifications noted. IMPRESSION: No acute osseous abnormality about the left ankle. Electronically Signed   By: Jeannine Boga M.D.   On: 01/08/2019 13:50   Dg Ankle Complete Right  Result Date: 01/08/2019 CLINICAL DATA:  Initial evaluation for acute trauma, fall. EXAM: RIGHT ANKLE - COMPLETE 3+ VIEW COMPARISON:  None. FINDINGS: Tiny osseous fragment at the distal aspect of the medial malleolus, suspicious for an acute minimally displaced avulsion type fracture. Overlying soft tissue swelling. No other acute fracture or dislocation. Ankle mortise approximated. Talar dome intact. Plantar calcaneal enthesophyte noted. Mild diffuse osteopenia noted. IMPRESSION: 1. Tiny osseous fragment at the distal aspect of the medial malleolus, suspicious for an acute minimally displaced avulsion type fracture. 2. Associated soft tissue swelling about the ankle. Electronically Signed   By: Jeannine Boga M.D.   On: 01/08/2019 13:53   Dg Knee Complete 4 Views Left  Result Date: 01/08/2019 CLINICAL DATA:  Initial evaluation for acute trauma, fall. EXAM: LEFT KNEE - COMPLETE 4+ VIEW COMPARISON:  Prior radiograph from 07/19/2016. FINDINGS: There is a subtle focal linear lucency extending through the lateral aspect of the tibial plateau, favored to be degenerative in nature, although a possible subtle acute nondisplaced fracture not entirely excluded. No appreciable joint effusion. No other acute fracture dislocation. Moderate tricompartmental degenerative osteoarthrosis, advanced from previous. Bones are mildly osteopenic. No appreciable soft tissue injury. IMPRESSION: 1. Subtle linear lucency extending through the lateral aspect of the tibial plateau, age indeterminate. While this finding is favored to be chronic and degenerative in nature, a subtle acute nondisplaced fracture would be difficult to exclude. Correlation with physical exam recommended. Finding could be further assessed with  dedicated cross-sectional imaging as indicated. 2. No other acute osseous abnormality about the knee. 3. Moderate tricompartmental degenerative osteoarthrosis, advanced from previous. Electronically Signed   By: Jeannine Boga M.D.   On: 01/08/2019 13:49   Dg Knee Complete 4 Views Right  Result Date: 01/08/2019 CLINICAL DATA:  Initial evaluation for acute trauma, fall. EXAM: RIGHT KNEE - COMPLETE 4+ VIEW COMPARISON:  None. FINDINGS: No acute fracture dislocation. No joint effusion. Moderate tricompartmental degenerative osteoarthrosis. Mild osteopenia. No acute soft tissue abnormality. IMPRESSION: 1. No acute osseous abnormality about the right knee. 2. Moderate tricompartmental degenerative osteoarthrosis. Electronically Signed   By: Jeannine Boga M.D.   On: 01/08/2019 14:14   Dg Foot Complete Right  Result Date: 01/08/2019 CLINICAL DATA:  Initial evaluation for acute trauma, fall. EXAM: RIGHT FOOT COMPLETE - 3+ VIEW COMPARISON:  Prior radiograph from 01/26/2016. FINDINGS: Tiny osseous fragment at the distal aspect of the right medial malleolus, suspicious for small avulsion type fracture, better seen on concomitant radiograph of the right ankle. No other acute fracture dislocation about the foot. Mild scattered osteoarthritic changes noted. Plantar calcaneal enthesophyte. Mild diffuse osteopenia. Soft tissue swelling again noted about the ankle. IMPRESSION: 1. Tiny osseous fragment at the distal aspect of the right medial malleolus, suspicious for small avulsion type fracture, better seen on concomitant radiograph of the right ankle. 2. No other acute osseous abnormality about the foot. Electronically Signed   By: Jeannine Boga M.D.   On: 01/08/2019 13:56    Procedures Procedures (including critical care time)  Medications Ordered in ED Medications  acetaminophen (TYLENOL) tablet 650 mg (650 mg Oral Given 01/08/19 1454)     Initial Impression / Assessment and Plan / ED Course   I have reviewed the triage vital signs and the nursing notes.  Pertinent labs & imaging results that were available during my care of the patient were reviewed by me and considered in my medical decision making (see chart for details).        Patient with fall.  Pain of bilateral lower extremities and has likely fracture right ankle and potential fracture left knee.  Will get CT scan of knee.  However per patient and patient's sister I think she will have difficulty at home due to pain both previously existing and new pain with this.  Will discuss with care management pending results of CT scan of knee.  Care return over to Dr. Sherry Ruffing  Final Clinical Impressions(s) / ED Diagnoses   Final diagnoses:  Fall, initial encounter  Closed fracture of right ankle, initial encounter    ED Discharge Orders    None       Davonna Belling, MD 01/08/19 1527

## 2019-01-08 NOTE — ED Notes (Signed)
Call made to patients sister Lorna Dibble, per her request and notified patient still in progress with CT results pending. Sister wants [name] to know that she needs help with her sister and she was in so much pain this morning she was unable to stand or ambulate by herself. Pts sister requesting help from home health. Notified Dr. Alvino Chapel and primary RN of requests.

## 2019-01-08 NOTE — ED Notes (Signed)
Patient transported to X-ray 

## 2019-01-08 NOTE — TOC Initial Note (Signed)
Transition of Care [facility]) - Initial/Assessment Note    Patient Details  Name: Megan [name] MRN: [id number] Date of Birth: [birth year]  Transition of Care [facility]) CM/SW Contact:    Erenest Rasher, RN Phone Number: 01/08/2019, 5:31 PM  Clinical Narrative:                 Pt states her sister will be out of town for a week. She has stairs to get into her house. She has been [facility] rehab in Jan 2020. Had [facility], they can accept referral if pt goes home. Plan is SNF rehab. TOC CM/CSW will continue to follow for placement. Waiting PT/OT recommendations.   Expected Discharge Plan: Skilled Nursing Facility Barriers to Discharge: SNF Pending bed offer   Patient Goals and CMS Choice Patient states their goals for this hospitalization and ongoing recovery are:: do not have support to assist in home CMS Medicare.gov Compare Post Acute Care list provided to:: Patient Choice offered to / list presented to : Patient  Expected Discharge Plan and Services Expected Discharge Plan: [place] In-house Referral: Clinical Social Work Discharge Planning Services: CM Consult Post Acute Care Choice: [place] arrangements for the past 2 months: Single Family Home                 DME Arranged: N/A         HH Arranged: PT, OT, Nurse's Aide [place] Agency: [place] ([facility]) Date HH Agency Contacted: 01/08/19 Time [place]: 1640 Representative spoke with at [place]: Sharin Grave  Prior Living Arrangements/Services Living arrangements for the past 2 months: [place] with:: Relatives Patient language and need for interpreter reviewed:: Yes Do you feel safe going back to the place where you live?: No   caregiver will be out of town for a week  Need for Family Participation in Patient Care: Yes (Comment) Care giver support system in place?: Yes (comment) Current home services: DME(rolling walker, cane) Criminal  Activity/Legal Involvement Pertinent to Current Situation/Hospitalization: No - Comment as needed  Activities of Daily Living      Permission Sought/Granted Permission sought to share information with : Case Manager, Facility Sport and exercise psychologist, PCP Permission granted to share information with : Yes, Verbal Permission Granted              Emotional Assessment       Orientation: : Oriented to Self, Oriented to Place, Oriented to  Time, Oriented to Situation      Admission diagnosis:  Fall Patient Active Problem List   Diagnosis Date Noted  . CKD (chronic kidney disease) stage 3, GFR 30-59 ml/min ([facility]) 11/26/2018  . Cellulitis of lower extremity   . Diastolic CHF ([place]) 21/30/8657  . DM (diabetes mellitus), type 2 with complications ([place]) 84/69/6295  . Hypertensive heart disease with CHF (congestive heart failure) ([place]) 09/08/2018  . Leg edema 09/08/2018  . Blood in stool 09/08/2018  . OSA (obstructive sleep apnea) 09/08/2018  . Sensorineural hearing loss (SNHL) of both ears 07/31/2017  . Lumbar radiculopathy 12/05/2016  . Diabetic polyneuropathy associated with diabetes mellitus due to underlying condition ([place]) 07/09/2015  . Vitamin deficiency related neuropathy 07/09/2015  . Hx of adenomatous colonic polyps 12/02/2010   PCP:  [name], MD Pharmacy:   [id number] [place], [address] [place] [address] Phone: [phone] Fax: [phone]  [place], [place] [address] [place] [address] Phone: [phone] Fax: [phone]     Social Determinants of Health (SDOH) Interventions    Readmission Risk Interventions No flowsheet data found.

## 2019-01-08 NOTE — Progress Notes (Signed)
Orthopedic Tech Progress Note Patient Details:  Megan [name] 08/08/1946 [id number]  Ortho Devices Type of Ortho Device: Knee Immobilizer Ortho Device/Splint Location: right Ortho Device/Splint Interventions: Application   Post Interventions Patient Tolerated: Well Instructions Provided: Care of device   [name] 01/08/2019, 4:40 PM

## 2019-01-09 ENCOUNTER — Encounter (HOSPITAL_COMMUNITY): Payer: Self-pay | Admitting: Emergency Medicine

## 2019-01-09 ENCOUNTER — Emergency Department (HOSPITAL_COMMUNITY): Payer: Medicare Other

## 2019-01-09 ENCOUNTER — Ambulatory Visit: Payer: Medicare Other | Admitting: Family Medicine

## 2019-01-09 DIAGNOSIS — S8251XA Displaced fracture of medial malleolus of right tibia, initial encounter for closed fracture: Secondary | ICD-10-CM | POA: Diagnosis not present

## 2019-01-09 MED ORDER — ACETAMINOPHEN 650 MG RE SUPP
650.0000 mg | Freq: Once | RECTAL | Status: AC
  Administered 2019-01-09: 20:00:00 650 mg via RECTAL
  Filled 2019-01-09: qty 1

## 2019-01-09 MED ORDER — HYDROCORTISONE (PERIANAL) 2.5 % EX CREA
TOPICAL_CREAM | Freq: Two times a day (BID) | CUTANEOUS | Status: DC | PRN
  Filled 2019-01-09 (×2): qty 28.35

## 2019-01-09 MED ORDER — TIZANIDINE HCL 4 MG PO TABS
2.0000 mg | ORAL_TABLET | Freq: Once | ORAL | Status: AC
  Administered 2019-01-09: 02:00:00 2 mg via ORAL
  Filled 2019-01-09: qty 1

## 2019-01-09 MED ORDER — ACETAMINOPHEN 325 MG PO TABS
650.0000 mg | ORAL_TABLET | Freq: Once | ORAL | Status: AC
  Administered 2019-01-09: 08:00:00 650 mg via ORAL
  Filled 2019-01-09: qty 2

## 2019-01-09 MED ORDER — SODIUM CHLORIDE 0.9 % IV BOLUS
500.0000 mL | Freq: Once | INTRAVENOUS | Status: AC
  Administered 2019-01-09: 18:00:00 500 mL via INTRAVENOUS

## 2019-01-09 NOTE — Progress Notes (Signed)
CSW attempted to meet with patient at bedside to complete assessment but was unable to do so due to patient currently having a choking episode per EMT. EMT stated it is likely that the patient will need a swallow study since she has choked twice today.  CSW to continue following.  Madilyn Fireman, MSW, [place] Clinical Social Worker Emergency Department [employer] [phone]

## 2019-01-09 NOTE — ED Notes (Addendum)
Spoke with Alvino Chapel MD in regards to patient having difficulty swallowing.  Pt coughs, chokes, and gags to the point of vomiting when trying to swallow.   Pt NPO until swallow evaluation completed.

## 2019-01-09 NOTE — Progress Notes (Signed)
OT NOTE     Pt expressed death of 5 family members in a very close time frame together and still grieving. Pt could benefit from pastoral care visit and also to have notary document her POA. Pt needs to change her POA from her deceased spouse.   Jeri Modena, OTR/L  Acute Rehabilitation Services Pager: [phone] Office: [phone] .

## 2019-01-09 NOTE — ED Notes (Signed)
Texted md about pain med for pt.

## 2019-01-09 NOTE — ED Notes (Signed)
Breakfast tray ordered 

## 2019-01-09 NOTE — TOC Progression Note (Signed)
Transition of Care [facility]) - Progression Note    Patient Details  Name: Megan [name] MRN: [id number] Date of Birth: [birth year]  Transition of Care [facility]) CM/SW Contact  Fuller Mandril, RN Phone Number: 01/09/2019, 9:06 AM  Clinical Narrative:    Following for disposition recommendations by PT.   Expected Discharge Plan: Skilled Nursing Facility Barriers to Discharge: SNF Pending bed offer  Expected Discharge Plan and Services Expected Discharge Plan: [place] In-house Referral: Clinical Social Work Discharge Planning Services: CM Consult Post Acute Care Choice: [place] arrangements for the past 2 months: Single Family Home                 DME Arranged: N/A         HH Arranged: PT, OT, Nurse's Aide [place] Agency: [place] ([facility]) Date HH Agency Contacted: 01/08/19 Time HH Agency Contacted: 1640 Representative spoke with at [place]: Sharin Grave   Social Determinants of Health ([place]) Interventions    Readmission Risk Interventions No flowsheet data found.

## 2019-01-09 NOTE — Progress Notes (Signed)
Chaplain responded to spiritual consult. Patient requests prayer.  Patient needs help with new Advanced Directive. Chaplain visited with patient and provided ministry of presence.  Patient recalled five significant family member losses within two year period. Patient is currently living with sister and wishes to make her [place].  Chaplain explained AD process to her and left forms with her to review and sign as able. Chaplain offered prayer.  Will check back later today if able. Rev. Tamsen Snider Pager [phone]

## 2019-01-09 NOTE — ED Provider Notes (Signed)
01/09/19 Please see prior provider note for full H&P.  Briefly patient here s/p fall w/ knee & ankle pain. Sustained R ankle fx--> placed in CAM walker. L knee CT without fx/dislocation- likely soft tissue or ligamentous injury- placed in knee immobilizer.   CM consulted due to patient concern for mobility/safety at home given she has several steps in the house & BLE injuries. Awaiting PT/OT.   She is resting comfortably, requesting tylenol.   Physical Exam  BP (!) 119/53   Pulse 94   Temp 98.3 F (36.8 C) (Oral)   Resp (!) 25   Ht 5' 2.5" (1.588 m)   Wt 96.4 kg   SpO2 97%   BMI 38.25 kg/m   Physical Exam Resting comfortably in bed in no acute distress.   ED Course/Procedures     Procedures  MDM   Awaiting PT/OT & placement.  Tylenol ordered.   11:00: Evaluated by PT/OT- recommendation for SNF, pending placement.   12:00: RN informed me patient is requesting preparation H- she uses this at home for her hemorrhoids, this is not a new problem, orders placed.        Leafy Kindle 01/09/19 1529    Davonna Belling, MD 01/10/19 1535

## 2019-01-09 NOTE — ED Notes (Signed)
Pt placed on bedpan. Purewick placed and hooked to suction.

## 2019-01-09 NOTE — ED Notes (Signed)
Gave phone to pt so she could speak to family.

## 2019-01-09 NOTE — ED Notes (Signed)
Left message for Speech team in regards to swallow evaluation.

## 2019-01-09 NOTE — ED Notes (Signed)
Pt asking for ice chips, given green sponge to wet mouth due to pending swallow eval

## 2019-01-09 NOTE — ED Provider Notes (Signed)
  Physical Exam  BP 111/64   Pulse (!) 103   Temp 98.1 F (36.7 C) (Oral)   Resp 20   Ht 5' 2.5" (1.588 m)   Wt 96.4 kg   SpO2 95%   BMI 38.25 kg/m   Physical Exam  ED Course/Procedures     Procedures  MDM  Nurse came to talk to me about the patient.  States that the patient has choked 2-3 different times for taking pills or eating.  And after she had to cough it up.  States this would fail her swallow screen.  Will add chest x-ray and get a speech and language swallowing eval.       Davonna Belling, MD 01/09/19 1338

## 2019-01-09 NOTE — Evaluation (Signed)
Occupational Therapy Evaluation Patient Details Name: Megan [name] MRN: [id number] DOB: February 03, 1946 Today's Date: 01/09/2019    History of Present Illness 73 yo female presenting to ED after Patient presents after fall.  Was walking down stairs and fell on the last stair.  Complaining of pain in right knee down to right ankle. radiology reveals Tiny osseous fragment at the distal aspect of the right medial, Subtle linear lucency extending through the lateral aspect of the tibial palteau, age indeterminate, moderat DJD L knee,    Clinical Impression   PT admitted with s/p fall. Pt currently with functional limitiations due to the deficits listed below (see OT problem list). Pt currently total +2 total for bed mobility for sit <>supine. Pt unable to tolerate static standing attempt this session. Pt complete adl at eob with min (A) sitting.  Pt will benefit from skilled OT to increase their independence and safety with adls and balance to allow discharge SNF (requesting return to [facility]).     Follow Up Recommendations  SNF;Supervision/Assistance - 24 hour    Equipment Recommendations  3 in 1 bedside commode;Wheelchair (measurements OT);Wheelchair cushion (measurements OT);Hospital bed    Recommendations for Other Services  Pastoral care ( see additional note for details)     Precautions / Restrictions Precautions Precautions: Fall Precaution Comments: cam boot R LE and L LE KI with home shoe Required Braces or Orthoses: Knee Immobilizer - Left Knee Immobilizer - Left: On at all times Restrictions Weight Bearing Restrictions: No      Mobility Bed Mobility Overal bed mobility: Needs Assistance Bed Mobility: Supine to Sit;Sit to Supine     Supine to sit: +2 for physical assistance;Mod assist Sit to supine: +2 for physical assistance;Total assist   General bed mobility comments: pt anxious about movement due to potential pain. pt able to progress bil Le to eob  with (A) but with verbal cues to activate at the hip  Transfers                      Balance                                           ADL either performed or assessed with clinical judgement   ADL Overall ADL's : Needs assistance/impaired Eating/Feeding: Set up;Bed level   Grooming: Oral care;Minimal assistance;Sitting Grooming Details (indicate cue type and reason): sitting eob min (A) for balance Upper Body Bathing: Moderate assistance   Lower Body Bathing: Total assistance   Upper Body Dressing : Moderate assistance   Lower Body Dressing: Total assistance     Toilet Transfer Details (indicate cue type and reason): eob only this session           General ADL Comments: pt tolerated EOB sitting. see PT note for vitals. pt self reports concern for BP drops     Vision Baseline Vision/History: Wears glasses Wears Glasses: At all times       Perception     Praxis      Pertinent Vitals/Pain Pain Assessment: 0-10 Pain Score: 10-Worst pain ever Pain Location: R ankle back  Pain Descriptors / Indicators: Aching;Grimacing;Guarding;Crying Pain Intervention(s): Monitored during session;Repositioned     Hand Dominance Right   Extremity/Trunk Assessment Upper Extremity Assessment Upper Extremity Assessment: Defer to OT evaluation   Lower Extremity Assessment Lower Extremity Assessment: RLE deficits/detail;LLE deficits/detail  Cervical / Trunk Assessment Cervical / Trunk Assessment: Kyphotic   Communication Communication Communication: No difficulties   Cognition Arousal/Alertness: Awake/alert Behavior During Therapy: WFL for tasks assessed/performed Overall Cognitive Status: Within Functional Limits for tasks assessed                                     General Comments  VSS    Exercises     Shoulder Instructions      Home Living Family/patient expects to be discharged to:: Skilled nursing facility                                         Prior Functioning/Environment Level of Independence: Independent with assistive device(s)        Comments: walking with no dme recently         OT Problem List: Decreased strength;Decreased activity tolerance;Impaired balance (sitting and/or standing);Decreased safety awareness;Decreased knowledge of use of DME or AE;Decreased knowledge of precautions;Pain      OT Treatment/Interventions: Self-care/ADL training;Therapeutic exercise;Neuromuscular education;Energy conservation;Manual therapy;DME and/or AE instruction;Modalities;Therapeutic activities;Cognitive remediation/compensation;Balance training;Patient/family education    OT Goals(Current goals can be found in the care plan section) Acute Rehab OT Goals Patient Stated Goal: to return to her home and to [facility] her [facility] OT Goal Formulation: With patient Time For Goal Achievement: 01/23/19 Potential to Achieve Goals: Good  OT Frequency: Min 2X/week   Barriers to D/C: Decreased caregiver support          Co-evaluation PT/OT/SLP Co-Evaluation/Treatment: Yes Reason for Co-Treatment: Complexity of the patient's impairments (multi-system involvement);For patient/therapist safety PT goals addressed during session: Mobility/safety with mobility OT goals addressed during session: ADL's and self-care;Proper use of Adaptive equipment and DME;Strengthening/ROM      AM-PAC OT "6 Clicks" Daily Activity     Outcome Measure Help from another person eating meals?: None Help from another person taking care of personal grooming?: A Little Help from another person toileting, which includes using toliet, bedpan, or urinal?: A Lot Help from another person bathing (including washing, rinsing, drying)?: A Lot Help from another person to put on and taking off regular upper body clothing?: A Little Help from another person to put on and taking off regular lower body clothing?:  Total 6 Click Score: 15   End of Session Equipment Utilized During Treatment: Left knee immobilizer Nurse Communication: Mobility status;Precautions  Activity Tolerance: Patient tolerated treatment well Patient left: in bed;with call bell/phone within reach  OT Visit Diagnosis: Unsteadiness on feet (R26.81);Muscle weakness (generalized) (M62.81)                Time: 7793-9030 OT Time Calculation (min): 44 min Charges:  OT General Charges $OT Visit: 1 Visit OT Evaluation $OT Eval Moderate Complexity: 1 Mod OT Treatments $Self Care/Home Management : 8-22 mins   Jeri Modena, OTR/L  Acute Rehabilitation Services Pager: [phone] Office: [phone] .   Jeri Modena 01/09/2019, 10:40 AM

## 2019-01-09 NOTE — ED Notes (Signed)
Left second message with Speech Therapy in regards to swasllow eval.  Hoping to get a time frame of when to expect the evaluation.

## 2019-01-09 NOTE — Evaluation (Signed)
Physical Therapy Evaluation Patient Details Name: Megan [name] MRN: [id number] DOB: 07/05/1946 Today's Date: 01/09/2019   History of Present Illness  73 yo female presenting to ED after Patient presents after fall.  Was walking down stairs and fell on the last stair.  Complaining of pain in right knee down to right ankle. radiology reveals Tiny osseous fragment at the distal aspect of the right medial, Subtle linear lucency extending through the lateral aspect of the tibial palteau, age indeterminate, moderat DJD L knee,   Clinical Impression   Pt admitted with above diagnosis. Pt currently with functional limitations due to the deficits listed below (see PT Problem List). Megan [name] was at [facility] earlier this year, rehabbed back to being able to walk with RW; Was walking recently with assistive device prn; Currently lives at her sister's home; Presents with pain limiting functional mobility, activity tolerance, generalized weakness, and overall dependencies in mobiltiy and ADLs;  Pt will benefit from skilled PT to increase their independence and safety with mobility to allow discharge to the venue listed below.       Follow Up Recommendations SNF    Equipment Recommendations  Rolling walker with 5" wheels;3in1 (PT);Wheelchair (measurements PT)    Recommendations for Other Services       Precautions / Restrictions Precautions Precautions: Fall Precaution Comments: cam boot R LE and L LE KI with home shoe Required Braces or Orthoses: Knee Immobilizer - Left Knee Immobilizer - Left: On at all times Restrictions Weight Bearing Restrictions: No      Mobility  Bed Mobility Overal bed mobility: Needs Assistance Bed Mobility: Supine to Sit;Sit to Supine     Supine to sit: +2 for physical assistance;Mod assist Sit to supine: +2 for physical assistance;Total assist   General bed mobility comments: pt anxious about movement due to potential pain. pt able to  progress bil Le to eob with (A) but with verbal cues to activate at the hip; Heavy mod assist to square off hips at EOB and elevate trunk to sit; Total assist to return to supine, especially for help getting LEs onto bed; very painful  Transfers                    Ambulation/Gait                Stairs            Wheelchair Mobility    Modified Rankin (Stroke Patients Only)       Balance Overall balance assessment: Needs assistance Sitting-balance support: Bilateral upper extremity supported Sitting balance-Leahy Scale: Poor(approaching Fair) Sitting balance - Comments: sits with posterior pelvic tilt and posterior bias                                     Pertinent Vitals/Pain Pain Assessment: 0-10 Pain Score: 10-Worst pain ever Pain Location: R ankle back  Pain Descriptors / Indicators: Aching;Grimacing;Guarding;Crying Pain Intervention(s): Monitored during session;Repositioned    Home Living Family/patient expects to be discharged to:: Skilled nursing facility                      Prior Function Level of Independence: Independent with assistive device(s)         Comments: walking with no dme recently      Hand Dominance   Dominant Hand: Right    Extremity/Trunk Assessment  Upper Extremity Assessment Upper Extremity Assessment: Defer to OT evaluation    Lower Extremity Assessment Lower Extremity Assessment: RLE deficits/detail;LLE deficits/detail RLE Deficits / Details: R foot and ankle immobilized in CAM boot; tending to keep R hip and LE in external rotation at rest; significant R ankle pain with attempts to internally rotate hip to neutral; Able to initiate movement at hip and knee, but requiring assistance; can be weakness from recent decline, but also less active movement due to anticiaption of pain RLE: Unable to fully assess due to pain;Unable to fully assess due to immobilization LLE Deficits / Details: Knee  immobilized in KI; able to activate quad, though minimal; weakness present, but also a component of anticipation of pain LLE: Unable to fully assess due to pain    Cervical / Trunk Assessment Cervical / Trunk Assessment: Kyphotic  Communication   Communication: No difficulties  Cognition Arousal/Alertness: Awake/alert Behavior During Therapy: WFL for tasks assessed/performed Overall Cognitive Status: Within Functional Limits for tasks assessed                                        General Comments General comments (skin integrity, edema, etc.):   01/09/19 0830 01/09/19 0840 01/09/19 0845  Vital Signs  Pulse Rate 96 (!) 101 (!) 103  BP (!) 105/47 (!) 100/51 111/64  Patient Position (if appropriate) Lying Sitting (sitting unsupported at EOB) Sitting (unsupported at EOB)       Exercises     Assessment/Plan    PT Assessment Patient needs continued PT services  PT Problem List Decreased strength;Decreased range of motion;Decreased activity tolerance;Decreased balance;Decreased mobility;Decreased coordination;Decreased cognition;Decreased knowledge of use of DME;Decreased safety awareness;Decreased knowledge of precautions;Obesity;Pain       PT Treatment Interventions DME instruction;Gait training;Functional mobility training;Therapeutic activities;Therapeutic exercise;Balance training;Patient/family education;Wheelchair mobility training    PT Goals (Current goals can be found in the Care Plan section)  Acute Rehab PT Goals Patient Stated Goal: to return to her home and to [facility] PT Goal Formulation: With patient Time For Goal Achievement: 01/23/19 Potential to Achieve Goals: Fair    Frequency Min 2X/week   Barriers to discharge        Co-evaluation PT/OT/SLP Co-Evaluation/Treatment: Yes Reason for Co-Treatment: Complexity of the patient's impairments (multi-system involvement);For patient/therapist safety PT goals addressed  during session: Mobility/safety with mobility OT goals addressed during session: ADL's and self-care;Proper use of Adaptive equipment and DME;Strengthening/ROM       AM-PAC PT "6 Clicks" Mobility  Outcome Measure Help needed turning from your back to your side while in a flat bed without using bedrails?: A Lot Help needed moving from lying on your back to sitting on the side of a flat bed without using bedrails?: A Lot Help needed moving to and from a bed to a chair (including a wheelchair)?: Total Help needed standing up from a chair using your arms (e.g., wheelchair or bedside chair)?: Total Help needed to walk in hospital room?: Total Help needed climbing 3-5 steps with a railing? : Total 6 Click Score: 8    End of Session   Activity Tolerance: Patient limited by pain Patient left: in bed;with call bell/phone within reach Nurse Communication: Mobility status PT Visit Diagnosis: Other abnormalities of gait and mobility (R26.89);Muscle weakness (generalized) (M62.81);History of falling (Z91.81);Pain Pain - Right/Left: (Left knee, R ankle, back) Pain - part of body: (Left knee, R  ankle, back)    Time: 0822-0900 PT Time Calculation (min) (ACUTE ONLY): 38 min   Charges:   PT Evaluation $PT Eval Moderate Complexity: 1 Mod          Roney [name], [place]  Acute Rehabilitation Services Pager [phone] Office [place] 01/09/2019, 10:51 AM

## 2019-01-10 DIAGNOSIS — S8251XA Displaced fracture of medial malleolus of right tibia, initial encounter for closed fracture: Secondary | ICD-10-CM | POA: Diagnosis not present

## 2019-01-10 MED ORDER — FENTANYL CITRATE (PF) 100 MCG/2ML IJ SOLN
25.0000 ug | Freq: Once | INTRAMUSCULAR | Status: AC
  Administered 2019-01-10: 09:00:00 25 ug via INTRAVENOUS
  Filled 2019-01-10: qty 2

## 2019-01-10 MED ORDER — ACETAMINOPHEN 325 MG PO TABS
650.0000 mg | ORAL_TABLET | Freq: Three times a day (TID) | ORAL | Status: DC
  Administered 2019-01-10 – 2019-01-11 (×3): 650 mg via ORAL
  Filled 2019-01-10 (×4): qty 2

## 2019-01-10 NOTE — NC FL2 (Addendum)
[place] LEVEL OF CARE SCREENING TOOL     IDENTIFICATION  Patient Name: Megan [name] Birthdate: 07/29/1946 Sex: female Admission Date (Current Location): 01/08/2019  [facility] and [place] Number:  Herbalist and Address:  [facility], [place] [address] [place],  [address]      Provider Number: [id number]  Attending Physician Name and Address:  Default, Provider, MD  Relative Name and Phone Number:       Current Level of Care: Hospital Recommended Level of Care: [place] Prior Approval Number:    Date Approved/Denied:   PASRR Number:   [id number]  Discharge Plan:      Current Diagnoses: Patient Active Problem List   Diagnosis Date Noted  . CKD (chronic kidney disease) stage 3, GFR 30-59 ml/min (HCC) 11/26/2018  . Cellulitis of lower extremity   . Diastolic CHF ([place]) 09/06/3233  . DM (diabetes mellitus), type 2 with complications ([place]) 57/32/2025  . Hypertensive heart disease with CHF (congestive heart failure) ([place]) 09/08/2018  . Leg edema 09/08/2018  . Blood in stool 09/08/2018  . OSA (obstructive sleep apnea) 09/08/2018  . Sensorineural hearing loss (SNHL) of both ears 07/31/2017  . Lumbar radiculopathy 12/05/2016  . Diabetic polyneuropathy associated with diabetes mellitus due to underlying condition ([place]) 07/09/2015  . Vitamin deficiency related neuropathy 07/09/2015  . Hx of adenomatous colonic polyps 12/02/2010    Orientation RESPIRATION BLADDER Height & Weight     Self, Time, Situation, Place  Normal Continent Weight: 212 lb 8 oz (96.4 kg) Height:  5' 2.5" (158.8 cm)  BEHAVIORAL SYMPTOMS/MOOD NEUROLOGICAL BOWEL NUTRITION STATUS      Continent Diet  AMBULATORY STATUS COMMUNICATION OF NEEDS Skin   Extensive Assist Verbally Normal                       Personal Care Assistance Level of Assistance  Bathing, Feeding, Dressing Bathing Assistance: Limited assistance Feeding  assistance: Independent Dressing Assistance: Limited assistance     Functional Limitations Info  Speech, Hearing, Sight Sight Info: Adequate Hearing Info: Adequate Speech Info: Adequate    SPECIAL CARE FACTORS FREQUENCY  PT (By licensed PT), OT (By licensed OT)     PT Frequency: 5x weekly OT Frequency: 5x weekly            Contractures Contractures Info: Not present    Additional Factors Info                  Current Medications (01/10/2019):  This is the current hospital active medication list Current Facility-Administered Medications  Medication Dose Route Frequency Provider Last Rate Last Dose  . hydrocortisone (ANUSOL-HC) 2.5 % rectal cream   Rectal BID PRN Petrucelli, Samantha R, PA-C      . metFORMIN (GLUCOPHAGE) tablet 500 mg  500 mg Oral BID WC Tegeler, Gwenyth Allegra, MD   500 mg at 01/10/19 0950  . potassium chloride SA (K-DUR) CR tablet 40 mEq  40 mEq Oral Daily Tegeler, Gwenyth Allegra, MD   40 mEq at 01/10/19 0950  . simvastatin (ZOCOR) tablet 20 mg  20 mg Oral q1800 Tegeler, Gwenyth Allegra, MD       Current Outpatient Medications  Medication Sig Dispense Refill  . Acetaminophen (TYLENOL ARTHRITIS PAIN PO) Take 650 mg by mouth as needed (pain).     [name] levothyroxine (SYNTHROID, LEVOTHROID) 50 MCG tablet Take 50 mcg by mouth daily before breakfast.    . metFORMIN (GLUCOPHAGE) 500  MG tablet Take 500 mg by mouth 2 (two) times daily with a meal.   6  . metoprolol tartrate (LOPRESSOR) 25 MG tablet Take 1 tablet (25 mg total) by mouth 2 (two) times daily.    . Multiple Vitamin (MULTIVITAMIN WITH MINERALS) TABS tablet Take 1 tablet by mouth daily.    [name] OLANZapine (ZYPREXA) 2.5 MG tablet Take 1 tablet (2.5 mg total) by mouth as needed (sad). 5 tablet 0  . Omeprazole-Sodium Bicarbonate (ZEGERID) 20-1100 MG CAPS capsule Take 1 capsule by mouth at bedtime. 28 each 0  . ONETOUCH VERIO test strip USE TO TEST BLOOD GLUCOSE ONCE DAILY    . pantoprazole (PROTONIX) 40 MG tablet  TAKE 1 TABLET BY MOUTH  DAILY BEFORE BREAKFAST 90 tablet 0  . polyvinyl alcohol (LIQUIFILM TEARS) 1.4 % ophthalmic solution Place 1 drop into both eyes as needed for dry eyes. 15 mL 0  . potassium chloride SA (K-DUR,KLOR-CON) 20 MEQ tablet Take 40 mEq by mouth daily.    . simvastatin (ZOCOR) 20 MG tablet Take 20 mg by mouth daily at 6 PM.     . tiZANidine (ZANAFLEX) 2 MG tablet Patient take 0.5 mg tablet by mouth as needed for muscle spasms    . torsemide (DEMADEX) 100 MG tablet Take 0.5 tablets (50 mg total) by mouth daily. 45 tablet 3  . VITAMIN E PO Take 1 tablet by mouth daily.       Discharge Medications: Please see discharge summary for a list of discharge medications.  Relevant Imaging Results:  Relevant Lab Results:   Additional Information SSN: [id number]  Archie Endo, LCSW

## 2019-01-10 NOTE — Evaluation (Signed)
Clinical/Bedside Swallow Evaluation Patient Details  Name: Megan [name] MRN: [id number] Date of Birth: May 19, 1946  Today's Date: 01/10/2019 Time: SLP Start Time (ACUTE ONLY): 0855 SLP Stop Time (ACUTE ONLY): 0920 SLP Time Calculation (min) (ACUTE ONLY): 25 min  Past Medical History:  Past Medical History:  Diagnosis Date  . Allergy    bee stings  . Anxiety   . Asthmatic bronchitis   . Broken arm 1957/2008   right  . Cataract    had surgery   . Depression   . Diabetes mellitus without complication ([place])    prediabetes diet controlled  . Diverticulosis   . GERD (gastroesophageal reflux disease)   . Glaucoma    BIL  . Hemorrhoids   . Hx of adenomatous colonic polyps 12/02/2010  . Hyperlipidemia   . Hypertension   . Hypothyroidism 05/2018  . Inflammatory arthritis   . Knee bursitis   . Migraines   . Obesity   . Osteoarthritis   . Sleep apnea    Past Surgical History:  Past Surgical History:  Procedure Laterality Date  . CARPAL TUNNEL RELEASE Left    left wrist  . CATARACT EXTRACTION W/ INTRAOCULAR LENS  IMPLANT, BILATERAL Bilateral    1999 left, 2008 right  . COLONOSCOPY    . EXCISION MORTON'S NEUROMA Left 1978   left foot  . GLAUCOMA SURGERY Bilateral    and laser surgery, 2004 left, 2008 right  . PARTIAL HYSTERECTOMY  1991   Left an ovary  . POLYPECTOMY    . TOENAIL AVULSION Right    big toe   HPI:  73 year old female here status post fall with knee and ankle pain.  She did have a minor right ankle fracture currently in CAM Walker, also placed in knee immobilizer.  Patient awaiting placement as she is unable to ambulate on her own.  Yesterday was noted that she was choking on her food.  At that time she was placed n.p.o. awaiting swallow screen/evaluation. She has a long history of GERn and hiatal Hernia.    Assessment / Plan / Recommendation Clinical Impression  Pt today presents with normal function with no signs of dypshagia or aspiration. She does  report that yesterday she felt a pill lodge in her throat and then later choked on a piece of pineapple and then regurgitated the rest of her meal. She also reports sometimes having to cough up her cereal in the morning.  All reports are consistent with a suspected primary esophageal dysphagia. Offered basic strategies including the important of upright, open position of chest for meals (pt cannot reposition herself without assist), taking pills, especially large ones, whole in puree, moistening food and following with small sips, taking warm fluids with meal to assist in esophageal relaxation. Encouraged her to /fu with Dr. Carlean Purl when she is able. Initiated mechanical soft diet and thin liquids. No futher SLP f/u needed.  SLP Visit Diagnosis: Dysphagia, unspecified (R13.10)    Aspiration Risk  Mild aspiration risk    Diet Recommendation Dysphagia 3 (Mech soft);Thin liquid   Liquid Administration via: Cup;Straw Medication Administration: Whole meds with puree Supervision: Patient able to self feed Compensations: Minimize environmental distractions;Slow rate;Small sips/bites;Follow solids with liquid Postural Changes: Seated upright at 90 degrees;Remain upright for at least 30 minutes after po intake    Other  Recommendations Recommended Consults: Consider GI evaluation;Consider esophageal assessment Oral Care Recommendations: Oral care BID   Follow up Recommendations Skilled Nursing facility      Frequency and Duration  Prognosis        Swallow Study   General HPI: 73 year old female here status post fall with knee and ankle pain.  She did have a minor right ankle fracture currently in CAM Walker, also placed in knee immobilizer.  Patient awaiting placement as she is unable to ambulate on her own.  Yesterday was noted that she was choking on her food.  At that time she was placed n.p.o. awaiting swallow screen/evaluation. She has a long history of GERn and hiatal Hernia.   Type of Study: Bedside Swallow Evaluation Previous Swallow Assessment: EGD Diet Prior to this Study: NPO Temperature Spikes Noted: No Respiratory Status: Room air History of Recent Intubation: No Behavior/Cognition: Alert;Cooperative;Pleasant mood Oral Cavity Assessment: Within Functional Limits Oral Care Completed by SLP: No Oral Cavity - Dentition: Adequate natural dentition Vision: Functional for self-feeding Self-Feeding Abilities: Able to feed self Patient Positioning: Upright in bed Baseline Vocal Quality: Normal Volitional Cough: Strong Volitional Swallow: Able to elicit    Oral/Motor/Sensory Function Overall Oral Motor/Sensory Function: Within functional limits   Ice Chips     Thin Liquid Thin Liquid: Within functional limits Presentation: Cup;Straw;Self Fed    Nectar Thick Nectar Thick Liquid: Not tested   Honey Thick Honey Thick Liquid: Not tested   Puree Puree: Within functional limits   Solid     Solid: Within functional limits     Herbie [place], MA [place] Pager [phone] Office [phone]  Lynann Beaver 01/10/2019,10:03 AM

## 2019-01-10 NOTE — Discharge Planning (Signed)
Clinical Social Work is seeking post-discharge placement for this patient at the following level of care: SNF.    

## 2019-01-10 NOTE — ED Notes (Signed)
Patient provided additional SNF options from SW. Reviewing at this time.

## 2019-01-10 NOTE — TOC Progression Note (Addendum)
Transition of Care [facility]) - Progression Note    Patient Details  Name: Megan [name] MRN: [id number] Date of Birth: 02/03/1946  Transition of Care [facility]) CM/SW [place], LCSW Phone Number: 01/10/2019, 2:12 PM  Clinical Narrative:     CSW provided patient with Medicare.gov list with the facilities of which she has been given a bed offer for. At this time, they include [place], [place], [place], [place], [facility], and [place]. CSW explained list to patient and asked her to review the list and make a decision on which facility she would desire. Patient has a smart phone at bedside that [place] encouraged her to use for further research of the facilities, patient in agreement.   Expected Discharge Plan: [place] Barriers to Discharge: SNF Pending bed offer  Expected Discharge Plan and Services Expected Discharge Plan: [place] In-house Referral: Clinical Social Work Discharge Planning Services: CM Consult Post Acute Care Choice: [place] arrangements for the past 2 months: Single Family Home                 DME Arranged: N/A         HH Arranged: PT, OT, Nurse's Aide [place] Agency: [place] ([facility]) Date HH Agency Contacted: 01/08/19 Time HH Agency Contacted: 1640 Representative spoke with at [place]: Sharin Grave   Social Determinants of Health ([place]) Interventions    Readmission Risk Interventions No flowsheet data found.

## 2019-01-10 NOTE — Progress Notes (Signed)
Evening ED CSW met with patient to follow up on provider choice. CSW noted patient ruled out [facility] and [facility]. CSW noted they were trying to call [facility], [facility]. CSW noted patient reports she prefers [facility] but requested time to see if they other facilities reach back to her tomorrow. CSW will continue to follow for discharge support.  Lamonte Richer, LCSW, [place] Worker II [phone]

## 2019-01-10 NOTE — TOC Initial Note (Signed)
Transition of Care [facility]) - Initial/Assessment Note    Patient Details  Name: Megan [name] MRN: [id number] Date of Birth: [birth year]  Transition of Care [facility]) CM/SW Contact:    Archie Endo, LCSW Phone Number: 01/10/2019, 11:04 AM  Clinical Narrative:                 CSW spoke with patient to discuss the possibility of her going to a SNF at discharge. Patient was very pleasant and open to CSW interaction. Patient reports that she was discharged to [facility] in January after a brief inpatient stay due to leg swelling. Patient reports that her time at [facility] was "fine" and that she wishes to go there. Patient was unable to identify any back up options for placement but agreed to go to a facility in [facility]. CSW explained process to patient and will fax her out to local facilities.  Expected Discharge Plan: Skilled Nursing Facility Barriers to Discharge: SNF Pending bed offer   Patient Goals and CMS Choice Patient states their goals for this hospitalization and ongoing recovery are:: do not have support to assist in home CMS Medicare.gov Compare Post Acute Care list provided to:: Patient Choice offered to / list presented to : Patient  Expected Discharge Plan and Services Expected Discharge Plan: [place] In-house Referral: Clinical Social Work Discharge Planning Services: CM Consult Post Acute Care Choice: [place] Living arrangements for the past 2 months: [place]                 DME Arranged: N/A         HH Arranged: PT, OT, Nurse's Aide [place] Agency: [place] ([facility]) Date HH Agency Contacted: 01/08/19 Time HH Agency Contacted: 1640 Representative spoke with at [place]: Sharin Grave  Prior Living Arrangements/Services Living arrangements for the past 2 months: [place] with:: Self Patient language and need for interpreter reviewed:: No Do you feel safe going back to the place where  you live?: Yes   caregiver will be out of town for a week  Need for Family Participation in Patient Care: No (Comment) Care giver support system in place?: No (comment) Current home services: DME(rolling walker, cane) Criminal Activity/Legal Involvement Pertinent to Current Situation/Hospitalization: No - Comment as needed  Activities of Daily Living      Permission Sought/Granted Permission sought to share information with : Case Manager, Facility Sport and exercise psychologist, PCP Permission granted to share information with : Yes, Verbal Permission Granted              Emotional Assessment   Attitude/Demeanor/Rapport: Engaged Affect (typically observed): Accepting, Calm, Pleasant Orientation: : Oriented to Self, Oriented to Situation, Oriented to Place, Oriented to  Time Alcohol / Substance Use: Never Used Psych Involvement: No (comment)  Admission diagnosis:  Fall Patient Active Problem List   Diagnosis Date Noted  . CKD (chronic kidney disease) stage 3, GFR 30-59 ml/min (HCC) 11/26/2018  . Cellulitis of lower extremity   . Diastolic CHF ([place]) 70/96/2836  . DM (diabetes mellitus), type 2 with complications ([place]) 62/94/7654  . Hypertensive heart disease with CHF (congestive heart failure) ([place]) 09/08/2018  . Leg edema 09/08/2018  . Blood in stool 09/08/2018  . OSA (obstructive sleep apnea) 09/08/2018  . Sensorineural hearing loss (SNHL) of both ears 07/31/2017  . Lumbar radiculopathy 12/05/2016  . Diabetic polyneuropathy associated with diabetes mellitus due to underlying condition ([place]) 07/09/2015  . Vitamin deficiency related neuropathy 07/09/2015  .  Hx of adenomatous colonic polyps 12/02/2010   PCP:  [name], MD Pharmacy:   [id number] [place], [place] [address] [place] [address] Phone: [phone] Fax: [phone]  [place], [place] [facility] [address] [place] [address] [place] [address] Phone: [phone] Fax: [phone]     Social Determinants of Health ([place]) Interventions    Readmission Risk Interventions No flowsheet data found.

## 2019-01-10 NOTE — ED Provider Notes (Signed)
73 year old female here status post fall with knee and ankle pain.  She did have a minor right ankle fracture currently in CAM Walker, also placed in knee immobilizer.  Patient awaiting placement as she is unable to ambulate on her own.  Yesterday was noted that she was choking on her food.  At that time she was placed n.p.o. awaiting swallow screen/evaluation.  Staff notes patient is having pain in her right ankle.  She was given suppository Tylenol yesterday which did not improve her symptoms.  The patient denies any sensation or loss of range of motion of her toes.  Upon my evaluation patient has warm well perfused toes with intact sensation no significant swelling to the foot, no signs of compartment syndrome, this would be very unlikely with the minimal fracture noted.  Patient notes that previously Tylenol has helped but given her inability to tolerate p.o. she is unable to take Tylenol.  She notes she gets hallucinations with opioids.  Most recent labs from April show slightly elevated creatinine and decreased GFR.  She denies any history of GI bleeds.  She does note she is allergic to aspirin.  Patient will be given a small dose of fentanyl for pain control given limited options.  Vitals:   01/09/19 1925 01/10/19 0633  BP: (!) 126/57 (!) 149/59  Pulse: 79 90  Resp: 16 16  Temp: 99 F (37.2 C) (!) 97.1 F (36.2 C)  SpO2: 92% 94%     Okey Regal, PA-C 01/10/19 6754    Davonna Belling, MD 01/10/19 1535

## 2019-01-11 DIAGNOSIS — S8251XA Displaced fracture of medial malleolus of right tibia, initial encounter for closed fracture: Secondary | ICD-10-CM | POA: Diagnosis not present

## 2019-01-11 LAB — SARS CORONAVIRUS 2 BY RT PCR (HOSPITAL ORDER, PERFORMED IN ~~LOC~~ HOSPITAL LAB): SARS Coronavirus 2: NEGATIVE

## 2019-01-11 LAB — CBG MONITORING, ED: Glucose-Capillary: 169 mg/dL — ABNORMAL HIGH (ref 70–99)

## 2019-01-11 MED ORDER — FENTANYL CITRATE (PF) 100 MCG/2ML IJ SOLN
50.0000 ug | Freq: Once | INTRAMUSCULAR | Status: AC
  Administered 2019-01-11: 08:00:00 50 ug via INTRAVENOUS
  Filled 2019-01-11: qty 2

## 2019-01-11 NOTE — Discharge Instructions (Signed)
Follow-up with orthopedics for reevaluation.  Return to emergency department if any concerning signs or symptoms develop.

## 2019-01-11 NOTE — ED Notes (Signed)
Pt placed on bedpan and had small bowel movement. Pt cleaned up and purewick changed.

## 2019-01-11 NOTE — ED Notes (Signed)
PT in with pt.

## 2019-01-11 NOTE — TOC Progression Note (Signed)
Transition of Care [facility]) - Progression Note    Patient Details  Name: Megan [name] MRN: [id number] Date of Birth: 18-Oct-1945  Transition of Care [facility]) CM/SW [place], LCSW Phone Number: 01/11/2019, 11:59 AM  Clinical Narrative:     CSW spoke to patient to discuss placement options. Patient reports that she has had time to think it over and she is going to choose to go to [place]. CSW explained to patient that her COVID results would take some time to get back and then she could be discharged to [facility]. Patient had no questions and denied wanting CSW to contact any family members.   Expected Discharge Plan: [place] Barriers to Discharge: SNF Pending bed offer  Expected Discharge Plan and Services Expected Discharge Plan: [place] In-house Referral: Clinical Social Work Discharge Planning Services: CM Consult Post Acute Care Choice: [place] arrangements for the past 2 months: Single Family Home                 DME Arranged: N/A         HH Arranged: PT, OT, Nurse's Aide [place] Agency: [place] ([facility]) Date HH Agency Contacted: 01/08/19 Time HH Agency Contacted: 1640 Representative spoke with at [place]: Sharin Grave   Social Determinants of Health ([place]) Interventions    Readmission Risk Interventions No flowsheet data found.

## 2019-01-11 NOTE — ED Provider Notes (Signed)
Patient awaiting SNF placement.  Complains of knee and ankle pain.  Has a minor right ankle fracture, currently in cam walker on right and knee immobilizer on left.  While in the ED, she had some choking while eating her food and swallowing pills and so required swallow screen/SLP evaluation.  She tells me that she has not had any difficulty eating her food since then, no choking.  She denies fever, cough, or shortness of breath.  Chest x-ray 2 days ago shows no evidence of pneumonia.  She is complaining of ongoing pain in her lower extremities.  Has hallucinated with opioids in the past.  Most recent set of labs shows mild renal insufficiency.  Pain is not improved with Tylenol.  She reports that the small dose of fentanyl given yesterday it was not helpful.  Physical Exam  BP (!) 145/59 (BP Location: Left Arm)   Pulse 99   Temp 99.7 F (37.6 C) (Oral)   Resp 20   Ht 5' 2.5" (1.588 m)   Wt 96.4 kg   SpO2 92%   BMI 38.25 kg/m   Physical Exam Vitals signs and nursing note reviewed.  Constitutional:      General: She is not in acute distress.    Appearance: She is well-developed.  HENT:     Head: Normocephalic and atraumatic.  Eyes:     General:        Right eye: No discharge.        Left eye: No discharge.     Conjunctiva/sclera: Conjunctivae normal.  Neck:     Vascular: No JVD.     Trachea: No tracheal deviation.  Cardiovascular:     Rate and Rhythm: Normal rate and regular rhythm.     Pulses: Normal pulses.     Comments: 2+ radial and DP/PT pulses bilaterally.  Compartments are soft. Pulmonary:     Effort: Pulmonary effort is normal.     Breath sounds: Normal breath sounds.  Abdominal:     General: There is no distension.     Palpations: Abdomen is soft.     Tenderness: There is no abdominal tenderness.  Musculoskeletal:     Comments: Knee immobilizer to the left lower extremity, cam walker to the right lower extremity.  Skin:    General: Skin is warm and dry.   Findings: No erythema.  Neurological:     Mental Status: She is alert.  Psychiatric:        Behavior: Behavior normal.      MDM  Patient with ongoing complaints of right ankle and left knee pain secondary to fall.  No evidence of compartment syndrome or cellulitis. She was given a small dose of fentanyl yesterday which was not helpful.  Options are somewhat limited due to patient's renal insufficiency and side effects with opioids.  We will give a higher dose of fentanyl for pain control.  She will also require COVID-19 testing to screen prior to SNF placement.  Spoke with [place] with social work who recommends rapid test as patient has been in the department for 68 hours and this will aid in prompt SNF placement.      Renita Papa, PA-C 01/11/19 0820    Isla Pence, MD 01/11/19 [phone]

## 2019-01-11 NOTE — Progress Notes (Signed)
Occupational Therapy Treatment Patient Details Name: Megan [name] MRN: [id number] DOB: 1946/03/12 Today's Date: 01/11/2019    History of present illness 73 y.o. female admitted to ED 01/08/19 after fall with LE pain. Imaging shows tiny osseous fragment at distal aspect of R medial ankle; age-indeterminate L lateral tibial plateau; moderate L knee DJD. PMH includes OA, HTN, glaucoma, DM, depression, asthma, anxiety.   OT comments  Pt continues to benefit from pastoral care visits as this session again started with discussion of her late spouse and how she believes in angels. Pt sit<>Stand this session total +2 mod (A) from elevated surface. Pt reports feeling as if she was going to pass out with VSS. Pt with low DSP 45 then 50 with standing. Pt returned to supine as she became more symptomatic.    Follow Up Recommendations  SNF;Supervision/Assistance - 24 hour    Equipment Recommendations  3 in 1 bedside commode;Wheelchair (measurements OT);Wheelchair cushion (measurements OT);Hospital bed    Recommendations for Other Services      Precautions / Restrictions Precautions Precautions: Fall Precaution Comments: R foot cam walker boot; L knee immobilizer Required Braces or Orthoses: Knee Immobilizer - Left Knee Immobilizer - Left: On at all times       Mobility Bed Mobility Overal bed mobility: Needs Assistance Bed Mobility: Supine to Sit;Sit to Supine     Supine to sit: Mod assist;+2 for physical assistance Sit to supine: Total assist   General bed mobility comments: Pt able to initiate BLE movement towards EOB, modA+2 to assist BLEs to ground and trunk elevation. TotalA for return to supine  Transfers Overall transfer level: Needs assistance Equipment used: Rolling walker (2 wheeled) Transfers: Sit to/from Stand Sit to Stand: Max assist;+2 physical assistance         General transfer comment: MaxA+2 to stand to RW, assist to prevent LLE from sliding and prevent  posterior LOB. Pt fearful of falling and c/o dizziness. BP 118/46 upon return to sit; worsening dizziness while sitting BP down to 98/50    Balance Overall balance assessment: Needs assistance Sitting-balance support: Bilateral upper extremity supported Sitting balance-Leahy Scale: Fair Sitting balance - Comments: Able to maintain sitting balance with intermittent min guard to modA     Standing balance-Leahy Scale: Poor Standing balance comment: Reliant on UE support and external assist                           ADL either performed or assessed with clinical judgement   ADL Overall ADL's : Needs assistance/impaired Eating/Feeding: Independent           Lower Body Bathing: Total assistance Lower Body Bathing Details (indicate cue type and reason): peri care s/p void of bowels, incontinence of urine                        General ADL Comments: pt completed bed mobility with activation of bil LE and initiated sit<>stand from EOB     Vision       Perception     Praxis      Cognition Arousal/Alertness: Awake/alert Behavior During Therapy: WFL for tasks assessed/performed Overall Cognitive Status: Within Functional Limits for tasks assessed                                          Exercises  Shoulder Instructions       General Comments      Pertinent Vitals/ Pain       Pain Assessment: Faces Faces Pain Scale: Hurts whole lot Pain Location: BLEs with mobility (R>L) Pain Descriptors / Indicators: Aching;Grimacing;Guarding;Crying Pain Intervention(s): Limited activity within patient's tolerance;Monitored during session;Premedicated before session;Repositioned  Home Living                                          Prior Functioning/Environment              Frequency  Min 2X/week        Progress Toward Goals  OT Goals(current goals can now be found in the care plan section)  Progress towards OT  goals: Progressing toward goals  Acute Rehab OT Goals Patient Stated Goal: visit her husband at the [facility] OT Goal Formulation: With patient Time For Goal Achievement: 01/23/19 Potential to Achieve Goals: Good ADL Goals Pt Will Perform Grooming: with supervision;sitting Pt Will Perform Upper Body Bathing: with min assist;sitting Pt Will Perform Lower Body Bathing: with mod assist;sit to/from stand Pt Will Transfer to Toilet: with +2 assist;with min assist;stand pivot transfer;bedside commode Additional ADL Goal #1: Pt will complete bed mobility with mod (A) as precursor to adls.  Plan Discharge plan remains appropriate    Co-evaluation    PT/OT/SLP Co-Evaluation/Treatment: Yes Reason for Co-Treatment: Complexity of the patient's impairments (multi-system involvement);For patient/therapist safety;To address functional/ADL transfers PT goals addressed during session: Mobility/safety with mobility;Proper use of DME OT goals addressed during session: ADL's and self-care;Proper use of Adaptive equipment and DME;Strengthening/ROM      AM-PAC OT "6 Clicks" Daily Activity     Outcome Measure   Help from another person eating meals?: None Help from another person taking care of personal grooming?: A Little Help from another person toileting, which includes using toliet, bedpan, or urinal?: A Lot Help from another person bathing (including washing, rinsing, drying)?: A Lot Help from another person to put on and taking off regular upper body clothing?: A Little Help from another person to put on and taking off regular lower body clothing?: Total 6 Click Score: 15    End of Session Equipment Utilized During Treatment: Left knee immobilizer  OT Visit Diagnosis: Unsteadiness on feet (R26.81);Muscle weakness (generalized) (M62.81)   Activity Tolerance Patient tolerated treatment well   Patient Left in bed;with call bell/phone within reach   Nurse Communication Mobility  status;Precautions        Time: 1505-6979 OT Time Calculation (min): 41 min  Charges: OT General Charges $OT Visit: 1 Visit OT Treatments $Self Care/Home Management : 8-22 mins   Jeri Modena, OTR/L  Acute Rehabilitation Services Pager: [phone] Office: [phone] .    Jeri Modena 01/11/2019, 11:38 AM

## 2019-01-11 NOTE — Progress Notes (Addendum)
Physical Therapy Treatment Patient Details Name: Megan [name] MRN: [id number] DOB: 1945-10-20 Today's Date: 01/11/2019    History of Present Illness Pt is a 73 y.o. female admitted to ED 01/08/19 after fall with LE pain. Imaging shows tiny osseous fragment at distal aspect of R medial ankle; age-indeterminate L lateral tibial plateau; moderate L knee DJD. PMH includes OA, HTN, glaucoma, DM, depression, asthma, anxiety.   PT Comments    Pt progressing with mobility. Able to stand with maxA+2 and RW. Pt limited by pain and dizziness with upright mobility (see BP values below), therefore returned to supine instead of transfer to recliner. Pt with improved ability to use BLEs this session. Continue to recommend SNF-level therapies to maximize functional mobility and independence prior to return home.  Post-standing BP 118/46 Sitting EOB ~4 min BP 98/50    Follow Up Recommendations  SNF     Equipment Recommendations  Rolling walker with 5" wheels;3in1 (PT);Wheelchair (measurements PT)    Recommendations for Other Services       Precautions / Restrictions Precautions Precautions: Fall Precaution Comments: R foot cam walker boot; L knee immobilizer Required Braces or Orthoses: Knee Immobilizer - Left    Mobility  Bed Mobility Overal bed mobility: Needs Assistance Bed Mobility: Supine to Sit;Sit to Supine     Supine to sit: Mod assist;+2 for physical assistance Sit to supine: Total assist   General bed mobility comments: Pt able to initiate BLE movement towards EOB, modA+2 to assist BLEs to ground and trunk elevation. TotalA for return to supine  Transfers Overall transfer level: Needs assistance Equipment used: Rolling walker (2 wheeled) Transfers: Sit to/from Stand Sit to Stand: Max assist;+2 physical assistance         General transfer comment: MaxA+2 to stand to RW, assist to prevent LLE from sliding and prevent posterior LOB. Pt fearful of falling and c/o  dizziness. BP 118/46 upon return to sit; worsening dizziness while sitting BP down to 98/50  Ambulation/Gait             General Gait Details: Deferred due to hypotension   Stairs             Wheelchair Mobility    Modified Rankin (Stroke Patients Only)       Balance Overall balance assessment: Needs assistance Sitting-balance support: Bilateral upper extremity supported Sitting balance-Leahy Scale: Fair Sitting balance - Comments: Able to maintain sitting balance with intermittent min guard to modA     Standing balance-Leahy Scale: Poor Standing balance comment: Reliant on UE support and external assist                            Cognition Arousal/Alertness: Awake/alert Behavior During Therapy: WFL for tasks assessed/performed Overall Cognitive Status: Within Functional Limits for tasks assessed                                        Exercises      General Comments        Pertinent Vitals/Pain Pain Assessment: Faces Faces Pain Scale: Hurts whole lot Pain Location: BLEs with mobility (R>L) Pain Descriptors / Indicators: Aching;Grimacing;Guarding;Crying Pain Intervention(s): Limited activity within patient's tolerance;Repositioned    Home Living                      Prior Function  PT Goals (current goals can now be found in the care plan section) Acute Rehab PT Goals Patient Stated Goal: to return to her home and to visit her husband [facility] PT Goal Formulation: With patient Time For Goal Achievement: 01/23/19 Potential to Achieve Goals: Fair Progress towards PT goals: Progressing toward goals    Frequency    Min 2X/week      PT Plan Current plan remains appropriate    Co-evaluation PT/OT/SLP Co-Evaluation/Treatment: Yes Reason for Co-Treatment: For patient/therapist safety;To address functional/ADL transfers PT goals addressed during session: Mobility/safety with  mobility;Proper use of DME        AM-PAC PT "6 Clicks" Mobility   Outcome Measure  Help needed turning from your back to your side while in a flat bed without using bedrails?: A Lot Help needed moving from lying on your back to sitting on the side of a flat bed without using bedrails?: A Lot Help needed moving to and from a bed to a chair (including a wheelchair)?: A Lot Help needed standing up from a chair using your arms (e.g., wheelchair or bedside chair)?: A Lot Help needed to walk in hospital room?: Total Help needed climbing 3-5 steps with a railing? : Total 6 Click Score: 10    End of Session Equipment Utilized During Treatment: Gait belt Activity Tolerance: Patient tolerated treatment well;Treatment limited secondary to medical complications (Comment)(hypotension) Patient left: in bed;with call bell/phone within reach Nurse Communication: Mobility status PT Visit Diagnosis: Other abnormalities of gait and mobility (R26.89);Muscle weakness (generalized) (M62.81);History of falling (Z91.81);Pain     Time: 7902-4097 PT Time Calculation (min) (ACUTE ONLY): 41 min  Charges:  $Therapeutic Activity: 23-37 mins                    Mabeline Caras, PT, DPT Acute Rehabilitation Services  Pager [phone] Office [place] 01/11/2019, 10:47 AM

## 2019-01-11 NOTE — TOC Transition Note (Signed)
Transition of Care [facility]) - CM/SW Discharge Note   Patient Details  Name: Megan [name] MRN: [id number] Date of Birth: 1945-12-06  Transition of Care [facility]) CM/SW Contact:  Archie Endo, LCSW Phone Number: 01/11/2019, 1:51 PM   Clinical Narrative:     CSW spoke with Gae Bon, [place] to have AVS completed. PA, patient, and RN in agreement to transport patient via [facility] to [place]. Patient is going to room #308 with accepting MD being Dr. Copper.   Final next level of care: Skilled Nursing Facility Barriers to Discharge: No Barriers Identified   Patient Goals and CMS Choice Patient states their goals for this hospitalization and ongoing recovery are:: Return home CMS Medicare.gov Compare Post Acute Care list provided to:: Patient Choice offered to / list presented to : Patient  Discharge Placement              Patient chooses bed at: [facility] Patient to be transferred to facility by: [place] Name of family member notified: None    Discharge Plan and Services In-house Referral: Clinical Social Work Discharge Planning Services: CM Consult Post Acute Care Choice: [place]          DME Arranged: N/A         HH Arranged: PT, OT, Nurse's Aide [place] Agency: [facility] Date HH Agency Contacted: 01/08/19 Time HH Agency Contacted: 1640 Representative spoke with at [place]: Sharin Grave  Social Determinants of Health ([place]) Interventions     Readmission Risk Interventions No flowsheet data found.

## 2019-01-11 NOTE — Progress Notes (Signed)
CSW placed call to [place] at [place] to determine if there was bed availability today, no answer so a voicemail was left requesting a return call.  Madilyn Fireman, MSW, [place] Clinical Social Worker Emergency Department [employer] [phone]

## 2019-01-14 ENCOUNTER — Non-Acute Institutional Stay (SKILLED_NURSING_FACILITY): Payer: Medicare Other | Admitting: Internal Medicine

## 2019-01-14 ENCOUNTER — Encounter: Payer: Self-pay | Admitting: Internal Medicine

## 2019-01-14 ENCOUNTER — Telehealth: Payer: Self-pay | Admitting: *Deleted

## 2019-01-14 DIAGNOSIS — I10 Essential (primary) hypertension: Secondary | ICD-10-CM

## 2019-01-14 DIAGNOSIS — E118 Type 2 diabetes mellitus with unspecified complications: Secondary | ICD-10-CM | POA: Diagnosis not present

## 2019-01-14 DIAGNOSIS — S82891D Other fracture of right lower leg, subsequent encounter for closed fracture with routine healing: Secondary | ICD-10-CM | POA: Diagnosis not present

## 2019-01-14 DIAGNOSIS — I5032 Chronic diastolic (congestive) heart failure: Secondary | ICD-10-CM | POA: Diagnosis not present

## 2019-01-14 DIAGNOSIS — N183 Chronic kidney disease, stage 3 unspecified: Secondary | ICD-10-CM

## 2019-01-14 NOTE — Telephone Encounter (Signed)
Patient called - needs to cancel appt for this Wednesday 5/20. She is in [facility] rehab facility recovering from a fractured ankle.  Per Dr. Irene Limbo, can reschedule this 4 month f/u for 1 month.  Contacted patient, gave her information from Dr. Irene Limbo. Advised that appts for 5/20 will be cancelled and schedule message sent for appts in one month. Advised patient that facility may be able to bring her to appts in one month. Patient verbalized understanding of all information.

## 2019-01-14 NOTE — Progress Notes (Signed)
Location:    [facility] Room Number: 779/T Place of Service:  SNF [phone]) Provider:  Granville Lewis PA-C  [name], MD  Patient Care Team: [name], MD as PCP - General (Family Medicine) Nahser, Wonda Cheng, MD as PCP - Cardiology (Cardiology)  Extended Emergency Contact Information Primary Emergency Contact: [place], [place] Phone: [phone] Mobile Phone: [phone] Relation: Sister  Code Status:  Full Code Goals of care: Advanced Directive information Advanced Directives 01/14/2019  Does Patient Have a Medical Advance Directive? Yes  Type of Advance Directive (No Data)  Does patient want to make changes to medical advance directive? No - Patient declined  Would patient like information on creating a medical advance directive? -     Chief Complaint  Patient presents with  . Hospitalization Follow-up    Hospitalization follow up   Acute visit status post hospitalization for right leg pain with history of asmallright ankle fracture  HPI:  Pt is a 73 y.o. female seen today for a hospital f/u s/p admission for complaints of ankle pain after a fall- she was found to have a minimal right ankle fracture-thought to be a small minimally displaced avulsion type fracture of the medial malleolus  Apparently she had significant pain in the ER and was not tolerating Tylenol p.o. at the time was given a small dose of fentanyl.  Currently she does not appear to be complaining of acute pain she does have orders tolerating it.  Only in the ER she had a choking episode but apparently this was isolated no choking since then but apparently this is why she was not taking the Tylenol in the ER.  She also had a chest x-ray done which did not show any evidence of pneumonia.  Secondary to concerns  about support at home she is here for short-term rehab and skilled nursing.  It appears she actually was formally admitted in the hospital but  came here from the ER.  Her other diagnoses include type 2 diabetes she is on Glucophage and this appears to be fairly well controlled.  She also has a history of somewhat chronic pain back pain.  She did not do well with opiates apparently it gives her hallucinations.  She also has a history of hypertension she is on Lopressor this appears to be stable from the data we have so far.  She also has a history of GERD she is on a proton pump inhibitor as well as Zegerid  She also has a history of diastolic CHF she has been followed by cardiology she is on Demadex with potassium supplementation.  She also continues on Synthroid with a history of hypothyroidism  Only she is lying in bed comfortably vital signs appear to be stable she does not really have any acute complaints.  She did say she did have some recent diarrhea and this will have to be watched.      Past Medical History:  Diagnosis Date  . Allergy    bee stings  . Anxiety   . Asthmatic bronchitis   . Broken arm 1957/2008   right  . Cataract    had surgery   . Depression   . Diabetes mellitus without complication ([place])    prediabetes diet controlled  . Diverticulosis   . GERD (gastroesophageal reflux disease)   . Glaucoma    BIL  . Hemorrhoids   . Hx of adenomatous colonic polyps 12/02/2010  . Hyperlipidemia   .  Hypertension   . Hypothyroidism 05/2018  . Inflammatory arthritis   . Knee bursitis   . Migraines   . Obesity   . Osteoarthritis   . Sleep apnea    Past Surgical History:  Procedure Laterality Date  . CARPAL TUNNEL RELEASE Left    left wrist  . CATARACT EXTRACTION W/ INTRAOCULAR LENS  IMPLANT, BILATERAL Bilateral    1999 left, 2008 right  . COLONOSCOPY    . EXCISION MORTON'S NEUROMA Left 1978   left foot  . GLAUCOMA SURGERY Bilateral    and laser surgery, 2004 left, 2008 right  . PARTIAL HYSTERECTOMY  1991   Left an ovary  . POLYPECTOMY    . TOENAIL AVULSION Right    big toe     Allergies  Allergen Reactions  . Aspirin Anaphylaxis  . Bee Pollen     Extreme swelling due to bee stings  . Codeine Nausea And Vomiting  . Fish Oil Diarrhea  . Sulfa Antibiotics Nausea And Vomiting  . Camphor Dermatitis  . Camphor Dermatitis  . Lisinopril Cough  . Penicillins Rash    DID THE REACTION INVOLVE: Swelling of the face/tongue/throat, SOB, or low BP? Yes Sudden or severe rash/hives, skin peeling, or the inside of the mouth or nose? Unknown Did it require medical treatment? Unknown When did it last happen? If all above answers are "NO", may proceed with cephalosporin use.   . Sulfasalazine Nausea And Vomiting  . Vicodin [Hydrocodone-Acetaminophen] Nausea And Vomiting    Allergies as of 01/14/2019      Reactions   Aspirin Anaphylaxis   Bee Pollen    Extreme swelling due to bee stings   Codeine Nausea And Vomiting   Fish Oil Diarrhea   Sulfa Antibiotics Nausea And Vomiting   Camphor Dermatitis   Camphor Dermatitis   Lisinopril Cough   Penicillins Rash   DID THE REACTION INVOLVE: Swelling of the face/tongue/throat, SOB, or low BP? Yes Sudden or severe rash/hives, skin peeling, or the inside of the mouth or nose? Unknown Did it require medical treatment? Unknown When did it last happen? If all above answers are "NO", may proceed with cephalosporin use.   Sulfasalazine Nausea And Vomiting   Vicodin [hydrocodone-acetaminophen] Nausea And Vomiting      Medication List       Accurate as of Jan 14, 2019 11:01 AM. If you have any questions, ask your nurse or doctor.        STOP taking these medications   OLANZapine 2.5 MG tablet Commonly known as:  ZYPREXA Stopped by:  Granville Lewis, PA-C   VITAMIN E PO Stopped by:  Granville Lewis, PA-C     TAKE these medications   levothyroxine 50 MCG tablet Commonly known as:  SYNTHROID Take 50 mcg by mouth daily before breakfast.   metFORMIN 500 MG tablet Commonly known as:  GLUCOPHAGE Take 500 mg by mouth 2  (two) times daily with a meal.   metoprolol tartrate 25 MG tablet Commonly known as:  LOPRESSOR Take 1 tablet (25 mg total) by mouth 2 (two) times daily.   multivitamin with minerals Tabs tablet Take 1 tablet by mouth daily.   Omeprazole-Sodium Bicarbonate 20-1100 MG Caps capsule Commonly known as:  ZEGERID Take 1 capsule by mouth at bedtime.   OneTouch Verio test strip Generic drug:  glucose blood USE TO TEST BLOOD GLUCOSE ONCE DAILY   pantoprazole 40 MG tablet Commonly known as:  PROTONIX TAKE 1 TABLET BY MOUTH  DAILY BEFORE BREAKFAST  polyvinyl alcohol 1.4 % ophthalmic solution Commonly known as:  LIQUIFILM TEARS Place 1 drop into both eyes as needed for dry eyes.   potassium chloride SA 20 MEQ tablet Commonly known as:  K-DUR Take 40 mEq by mouth daily.   simvastatin 20 MG tablet Commonly known as:  ZOCOR Take 20 mg by mouth daily at 6 PM.   tiZANidine 2 MG tablet Commonly known as:  ZANAFLEX TAKE 1/2 TABLET (1MG ) BY MOUTH THREE TIMES DAILY AS NEEDED FOR SPASMS   torsemide 100 MG tablet Commonly known as:  DEMADEX Take 0.5 tablets (50 mg total) by mouth daily.   TYLENOL ARTHRITIS PAIN PO Take 650 mg by mouth as needed (pain).       Review of Systems   In general she is not complaining of any fever or chills.  Skin does not complain of rashes or itching.  Head ears eyes nose mouth and throat is not complaining of a sore throat or visual changes.  Respiratory does not complain of shortness of breath or cough.  Cardiac does not complain of chest pain she does have some mild lower extremity edema.  GI does not complain of abdominal pain nausea vomiting patient apparently did have some recent diarrhea  GU is not complaining of dysuria.  Musculoskeletal does have a history of pain more so in her back-at this point is not complaining of any acute discomfort again she does have the minimal right ankle fracture  Neurologic does not complain of headache  dizziness syncope or numbness.  Psych does have a listed history ofdepression but does not complain of being depressed or anxious currently  Immunization History  Administered Date(s) Administered  . Pneumococcal Conjugate-13 02/12/2014  . Tdap 12/31/2008   Pertinent  Health Maintenance Due  Topic Date Due  . MAMMOGRAM  02/14/2019 (Originally 10/22/1995)  . OPHTHALMOLOGY EXAM  02/14/2019 (Originally 10/22/1955)  . URINE MICROALBUMIN  02/14/2019 (Originally 10/22/1955)  . DEXA SCAN  02/14/2019 (Originally 10/21/2010)  . PNA vac Low Risk Adult (2 of 2 - PPSV23) 02/14/2019 (Originally 02/13/2015)  . HEMOGLOBIN A1C  03/10/2019  . INFLUENZA VACCINE  03/30/2019  . FOOT EXAM  06/20/2019  . COLONOSCOPY  06/01/2023   Fall Risk  10/30/2018 07/11/2016 01/07/2016  Falls in the past year? 0 No No   Functional Status Survey:    Vitals:   01/14/19 1023  BP: 130/72  Pulse: 75  Resp: 18  Temp: 97.9 F (36.6 C)  TempSrc: Oral    Physical Exam   In general this is a pleasant elderly female in no distress lying comfortably in bed.  Her skin is warm and dry.  Eyes visual acuity appears to be intact sclera and conjunctive are clear.  Oropharynx is clear mucous membranes moist.  Chest is clear to auscultation with somewhat shallow air entry there is no labored breathing.  Heart is regular rate and rhythm without murmur gallop or rub she has quite mild lower extremity edema complete assessment is somewhat difficult because she has her right lower leg in a cam walker and left lower leg in immobilizer.  Abdomen is somewhat obese soft nontender with positive bowel sounds.  Musculoskeletal Limited exam since she is in bed but is able to move her upper extremities it appears with relative baseline strength.  Limited lower extremity exam because her right leg is in a cam walker and left in an immobilizer.  Pedal pulses are intact capillary refill is intact bilaterally.  Neurologic is grossly  intact  her speech is clear touch sensation lower extremities is intact  Psych she is alert and oriented pleasant and appropriate  Labs reviewed: Recent Labs    09/13/18 0447 09/14/18 0403 09/15/18 0502 09/15/18 0834 09/16/18 0359 09/17/18 0418 11/05/18 1028 11/28/18 0944  NA 141 139 138  --  140 140 145* 144  K 3.3* 3.0* 3.2*  --  3.5 3.7 4.5 4.0  CL 101 96* 94*  --  96* 94* 102 99  CO2 29 31 33*  --  34* 35* 23 25  GLUCOSE 179* 180* 182*  --  177* 204* 113* 119*  BUN 51* 52* 57*  --  55* 57* 29* 25  CREATININE 1.22* 1.15* 1.40*  --  1.19* 1.22* 1.29* 1.17*  CALCIUM 8.3* 8.3* 8.0*  --  8.3* 8.4* 8.1* 7.7*  MG 1.8 1.9  --  1.9  --   --   --   --   PHOS  --   --  4.1  --  3.5 3.6  --   --    Recent Labs    09/04/18 1052 09/08/18 1609 09/09/18 0824 09/15/18 0502 09/16/18 0359 09/17/18 0418  AST 13* 15 15  --   --   --   ALT 8 10 8   --   --   --   ALKPHOS 83 77 63  --   --   --   BILITOT 0.3 0.8 0.2*  --   --   --   PROT 6.4* 6.4* 5.7*  --   --   --   ALBUMIN 2.9* 3.2* 2.7* 3.0* 2.9* 3.1*   Recent Labs    09/08/18 1720  09/13/18 0447 09/14/18 0403 11/28/18 0944  WBC 8.7   < > 7.4 7.1 9.4  NEUTROABS 6.5  --  4.9 4.8  --   HGB 9.1*   < > 9.1* 8.9* 11.4  HCT 30.9*   < > 30.4* 29.4* 35.5  MCV 93.9   < > 91.0 91.0 84  PLT 261   < > 285 258 401   < > = values in this interval not displayed.   Lab Results  Component Value Date   TSH 3.265 09/09/2018   Lab Results  Component Value Date   HGBA1C 7.1 (H) 09/09/2018   No results found for: CHOL, HDL, LDLCALC, LDLDIRECT, TRIG, CHOLHDL  Significant Diagnostic Results in last 30 days:  Dg Tibia/fibula Right  Result Date: 01/08/2019 CLINICAL DATA:  Initial evaluation for acute trauma, fall. EXAM: RIGHT TIBIA AND FIBULA - 2 VIEW COMPARISON:  None. FINDINGS: No acute fracture dislocation. Osteoarthritic changes noted about the knee and ankle. Mild osteopenia. No acute soft tissue abnormality. IMPRESSION: No acute  osseous abnormality about the right tibia/fibula. Electronically Signed   By: Jeannine Boga M.D.   On: 01/08/2019 14:11   Dg Ankle Complete Left  Result Date: 01/08/2019 CLINICAL DATA:  Initial evaluation for acute trauma, fall. EXAM: LEFT ANKLE COMPLETE - 3+ VIEW COMPARISON:  None. FINDINGS: No acute fracture dislocation. Ankle mortise approximated. Talar dome intact. Plantar calcaneal enthesophyte noted. Bones are mildly osteopenic. No acute soft tissue abnormality. Few scattered vascular calcifications noted. IMPRESSION: No acute osseous abnormality about the left ankle. Electronically Signed   By: Jeannine Boga M.D.   On: 01/08/2019 13:50   Dg Ankle Complete Right  Result Date: 01/08/2019 CLINICAL DATA:  Initial evaluation for acute trauma, fall. EXAM: RIGHT ANKLE - COMPLETE 3+ VIEW COMPARISON:  None. FINDINGS: Tiny osseous fragment at the  distal aspect of the medial malleolus, suspicious for an acute minimally displaced avulsion type fracture. Overlying soft tissue swelling. No other acute fracture or dislocation. Ankle mortise approximated. Talar dome intact. Plantar calcaneal enthesophyte noted. Mild diffuse osteopenia noted. IMPRESSION: 1. Tiny osseous fragment at the distal aspect of the medial malleolus, suspicious for an acute minimally displaced avulsion type fracture. 2. Associated soft tissue swelling about the ankle. Electronically Signed   By: Jeannine Boga M.D.   On: 01/08/2019 13:53   Ct Knee Left Wo Contrast  Result Date: 01/08/2019 CLINICAL DATA:  Left knee pain due to an injury suffered in a fall down stairs today. Initial encounter. EXAM: CT OF THE LEFT KNEE WITHOUT CONTRAST TECHNIQUE: Multidetector CT imaging of the left knee was performed according to the standard protocol. Multiplanar CT image reconstructions were also generated. COMPARISON:  Plain films left knee 07/19/2016 and 01/08/2019. FINDINGS: Bones/Joint/Cartilage There is no fracture or dislocation.  Small osteophytes are present about the knee and account for finding on plain films earlier today. Small subchondral cysts are also seen in the femoral trochlea and superior pole of the patella. Peaking of the tibial spines is noted. Ligaments Suboptimally assessed by CT. The cruciate and collateral ligaments appear intact. Muscles and Tendons Intact. Soft tissues Mild subcutaneous edema is present about the knee. No joint effusion. No Baker's cyst. IMPRESSION: Negative for fracture.  No acute abnormality. Mild to moderate osteoarthritis about the knee. Electronically Signed   By: Inge Rise M.D.   On: 01/08/2019 15:57   Dg Chest Portable 1 View  Result Date: 01/09/2019 CLINICAL DATA:  Choking EXAM: PORTABLE CHEST 1 VIEW COMPARISON:  09/08/2018 FINDINGS: Decreased lung volume with mild bibasilar atelectasis. Negative for heart failure. Probable small pleural effusions. No definite pneumonia IMPRESSION: Hypoventilation with bibasilar atelectasis and small effusions. Electronically Signed   By: Franchot Gallo M.D.   On: 01/09/2019 14:01   Dg Knee Complete 4 Views Left  Result Date: 01/08/2019 CLINICAL DATA:  Initial evaluation for acute trauma, fall. EXAM: LEFT KNEE - COMPLETE 4+ VIEW COMPARISON:  Prior radiograph from 07/19/2016. FINDINGS: There is a subtle focal linear lucency extending through the lateral aspect of the tibial plateau, favored to be degenerative in nature, although a possible subtle acute nondisplaced fracture not entirely excluded. No appreciable joint effusion. No other acute fracture dislocation. Moderate tricompartmental degenerative osteoarthrosis, advanced from previous. Bones are mildly osteopenic. No appreciable soft tissue injury. IMPRESSION: 1. Subtle linear lucency extending through the lateral aspect of the tibial plateau, age indeterminate. While this finding is favored to be chronic and degenerative in nature, a subtle acute nondisplaced fracture would be difficult to  exclude. Correlation with physical exam recommended. Finding could be further assessed with dedicated cross-sectional imaging as indicated. 2. No other acute osseous abnormality about the knee. 3. Moderate tricompartmental degenerative osteoarthrosis, advanced from previous. Electronically Signed   By: Jeannine Boga M.D.   On: 01/08/2019 13:49   Dg Knee Complete 4 Views Right  Result Date: 01/08/2019 CLINICAL DATA:  Initial evaluation for acute trauma, fall. EXAM: RIGHT KNEE - COMPLETE 4+ VIEW COMPARISON:  None. FINDINGS: No acute fracture dislocation. No joint effusion. Moderate tricompartmental degenerative osteoarthrosis. Mild osteopenia. No acute soft tissue abnormality. IMPRESSION: 1. No acute osseous abnormality about the right knee. 2. Moderate tricompartmental degenerative osteoarthrosis. Electronically Signed   By: Jeannine Boga M.D.   On: 01/08/2019 14:14   Dg Foot Complete Right  Result Date: 01/08/2019 CLINICAL DATA:  Initial evaluation for acute trauma,  fall. EXAM: RIGHT FOOT COMPLETE - 3+ VIEW COMPARISON:  Prior radiograph from 01/26/2016. FINDINGS: Tiny osseous fragment at the distal aspect of the right medial malleolus, suspicious for small avulsion type fracture, better seen on concomitant radiograph of the right ankle. No other acute fracture dislocation about the foot. Mild scattered osteoarthritic changes noted. Plantar calcaneal enthesophyte. Mild diffuse osteopenia. Soft tissue swelling again noted about the ankle. IMPRESSION: 1. Tiny osseous fragment at the distal aspect of the right medial malleolus, suspicious for small avulsion type fracture, better seen on concomitant radiograph of the right ankle. 2. No other acute osseous abnormality about the foot. Electronically Signed   By: Jeannine Boga M.D.   On: 01/08/2019 13:56   Dg Hip Unilat W Or Wo Pelvis 2-3 Views Right  Result Date: 01/08/2019 CLINICAL DATA:  Right hip pain since a fall down stairs today.  Initial encounter. EXAM: DG HIP (WITH OR WITHOUT PELVIS) 2-3V RIGHT COMPARISON:  None. FINDINGS: There is no evidence of hip fracture or dislocation. There is no evidence of arthropathy or other focal bone abnormality. IMPRESSION: Negative exam. Electronically Signed   By: Inge Rise M.D.   On: 01/08/2019 16:06    Assessment/Plan  #1 history of minimally displaced avulsion fracture medial malleolus of the right ankle.  She does have an order for Tylenol which apparently she is tolerating-she does not do well with opiates apparently it does give her hallucinations.  She also has an order for Zanaflex if needed at this point will monitor  -also will write for orthopedic follow-up-she will have PT and OT.  2.-  History of diabetes type 2 she is on Glucophage 500 mg twice daily so far we have minimal readings it was 111 this morning she appears to have run in the 100s in the hospital as well this point will monitor her hemoglobin A1c was 7.1 back in January.  3.  History of hypertension at this point will monitor so far from the readings I have been able to assess is stable she is on Lopressor 25 mg twice daily.  4.  History of GERD she is on Protonix as well as well as Zegerid- apparently she had a choking episode in the ER which of short duration- will write an order to speech therapy to take a look at her here.  She does not report any swallowing difficulties or choking today.  5.  History of diastolic CHF --weights  will need to be monitored Continue to monitor for any changes she is not complaining of any increased shortness of breath or chest pain or increased edema from baseline today  6- hyperlipidemia she is on Zocor her LDL was 54 on lab done in January.  7.  History of chronic kidney disease her creatinine was 1.17 on lab done in April-of note she does continue on fairly high-dose diuretics will try to update a metabolic panel-again this is been complicated somewhat with  coronavirus concerns but if able to be obtained by nursing staff will obtain.  8.-  History of hypothyroidism she continues on Synthroid 50 mcg a day--TSH in January was 3.265  [id number]-of note greater than 40 minutes spent assessing patient-reviewing her chart and labs coordinating and formulating a plan of care-- of note greater than 50% of time spent coordinating a plan of care with input as noted above

## 2019-01-16 ENCOUNTER — Non-Acute Institutional Stay (SKILLED_NURSING_FACILITY): Payer: Medicare Other | Admitting: Internal Medicine

## 2019-01-16 ENCOUNTER — Encounter: Payer: Self-pay | Admitting: Internal Medicine

## 2019-01-16 ENCOUNTER — Inpatient Hospital Stay: Payer: Medicare Other | Admitting: Hematology

## 2019-01-16 ENCOUNTER — Inpatient Hospital Stay: Payer: Medicare Other

## 2019-01-16 DIAGNOSIS — R1310 Dysphagia, unspecified: Secondary | ICD-10-CM

## 2019-01-16 DIAGNOSIS — E118 Type 2 diabetes mellitus with unspecified complications: Secondary | ICD-10-CM

## 2019-01-16 DIAGNOSIS — R531 Weakness: Secondary | ICD-10-CM

## 2019-01-16 DIAGNOSIS — N183 Chronic kidney disease, stage 3 unspecified: Secondary | ICD-10-CM

## 2019-01-16 DIAGNOSIS — S82891D Other fracture of right lower leg, subsequent encounter for closed fracture with routine healing: Secondary | ICD-10-CM | POA: Diagnosis not present

## 2019-01-16 DIAGNOSIS — S82891A Other fracture of right lower leg, initial encounter for closed fracture: Secondary | ICD-10-CM | POA: Insufficient documentation

## 2019-01-16 NOTE — Patient Instructions (Addendum)
See assessment and plan under each diagnosis in the problem list and acutely for this visit ?Total time 53 minutes; greater than 50% of the visit spent counseling patient and coordinating care for problems addressed at this encounter ? ?

## 2019-01-16 NOTE — Assessment & Plan Note (Addendum)
Current A1c and glucose recordings at SNF indicate adequate control. Monitor creatinine and GFR in view of metformin therapy.

## 2019-01-16 NOTE — Progress Notes (Signed)
NURSING HOME LOCATION:  [facility] ROOM NUMBER:  [id number]  CODE STATUS:  Full Code  PCP:  [name], MD  [address] [place] [address]   This is a comprehensive admission note to [facility] performed on this date less than 30 days from date of admission.  Included are preadmission medical/surgical history; reconciled medication list; family history; social history and comprehensive review of systems.   Corrections and additions to the records were documented. Comprehensive physical exam was also performed. Additionally a clinical summary was entered for each active diagnosis pertinent to this admission in the Problem List to enhance continuity of care.  HPI:   The patient was seen in the ED 01/09/2019 following a fall with associated knee and ankle pain.  A minor right ankle fracture was documented and the patient was placed in a CAM walker and a knee immobilizer.  Placement in SNF was recommended as she is unable to ambulate on her own. Also it was noted that she was choking on her food; n.p.o. status was initiated awaiting swallowing screen/evaluation.  Prior to being made n.p.o. oral Tylenol had controlled pain.  She is allergic to aspirin.  She had a history of hallucinations with opioids.  She was given a small dose of fentanyl for pain control given limited options.  Zanaflex, muscle relaxant, was ordered. Nonsteroidals were contraindicated as recent labs had shown an elevated creatinine of 1.17 and decreased GFR of 46 on 11/28/2018.  Creatinine had been as high as 1.76 on 09/10/2018.[name]  She has a history of colon polyps but not of GI bleeding. Despite the elevated creatinine, she is on metformin 500 mg twice daily.  Glucoses here at the facility have been well controlled with fastings of 111 and values up to 177 following the evening meal.  A1c is adequately current with a value of 7.1% on 09/09/2018.  Past medical and surgical history: Includes  hypothyroidism, essential hypertension, GERD, dyslipidemia, sleep apnea, osteoarthritis, history of migraines, no coma, history of adenomatous polyps, depression, and history of diverticulosis. Surgeries include carpal tunnel release, colonoscopy with polypectomy, and partial hysterectomy.  Social history: Reviewed.  She is a former smoker, having quit in 1973.  She does not drink alcohol.  Family history: Extensive family history reviewed.  It is strongly positive for diabetes and hypertension.   Review of systems: I asked her why she was wearing soft braces on both legs.  She then told me that she had been hospitalized at [facility] for 12 days in January for cellulitis of the lower extremities.  After that hospitalization she was in [facility] for rehab.  She was there from 1/21-2/05/2019.  She states that she went home and alternated between her home and her sister's home.  Her debilitation improved dramatically and she advanced from a walker to a cane and was preparing to walk unassisted when she had a marked "regression".  Acutely she noted she required assistance to even get out of a chair.  She has been profoundly weak in all extremities since. She states that when she fell 5/13 she was ambulating down the steps with her cane and became profoundly weak when she reached the ground level.  She found her self on the ground with her legs "twisted up under me".  This is actually her third fall during this period of weakness.  She denies any neurologic or cardiologic prodrome prior to the falls other than generalized weakness.  She denies an ascending  pattern, persistent weakness.  She does state that she becomes weak when she exerts herself and also has exertional dyspnea. She states that she has lost 60 pounds since December 2019.  She describes intermittent mucoid to loose stools. She states that she finds that she craves ice for the last 2 months.  She denies any bleeding dyscrasias.  She  had significant anemia while hospitalized in January but this has improved significantly. She has had dry eyes for 2 years and recently has had dry mouth.  It is because of the dry mouth that she had dysphagia in the ED she states.  Dysphagia is not an ongoing issue.  She has had some intermittent abdominal discomfort.  She is on generic Zegerid as well as Protonix. She states that she seen a rheumatologist who gave her shots for "possible rheumatoid arthritis". She describes depression since the death of her husband in 02/25/18.  They had been married for 46 years.  Constitutional: No fever Eyes: No redness, discharge, pain, vision change ENT/mouth: No nasal congestion, purulent discharge, earache, change in hearing, sore throat  Cardiovascular: No chest pain, palpitations, paroxysmal nocturnal dyspnea, claudication, edema  Respiratory: No cough, sputum production, hemoptysis, significant snoring, apnea Gastrointestinal: No heartburn, nausea /vomiting, rectal bleeding, melena Genitourinary: No dysuria, hematuria, pyuria, incontinence, nocturia Dermatologic: No new rash, pruritus, change in appearance of skin Neurologic: No dizziness, headache, syncope, seizures, numbness, tingling Psychiatric: No significant anorexia Endocrine: No change in hair/nails, excessive thirst, excessive hunger, excessive urination  Hematologic/lymphatic: No significant bruising, lymphadenopathy, abnormal bleeding Allergy/immunology: No itchy/watery eyes, significant sneezing, urticaria, angioedema  Physical exam:  Pertinent or positive findings: Eyebrows are thin laterally.  Dental hygiene is good.  There is a whitish structure above each iris, this is larger on the right than the left.  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  Heart sounds are distant.  Minor rales in a scattered distribution.  Pedal pulses are decreased.  She is weak to opposition in all extremities.  She has a resting, very  fine tremor of the right hand.  General appearance: Adequately nourished; no acute distress, increased work of breathing is present.   Lymphatic: No lymphadenopathy about the head, neck, axilla. Eyes: No conjunctival inflammation or lid edema is present. There is no scleral icterus. Ears:  External ear exam shows no significant lesions or deformities.   Nose:  External nasal examination shows no deformity or inflammation. Nasal mucosa are pink and moist without lesions, exudates Oral exam: Lips and gums are healthy appearing.There is no oropharyngeal erythema or exudate. Neck:  No thyromegaly, masses, tenderness noted.    Heart:  Normal rate and regular rhythm. S1 and S2 normal without gallop, murmur, click, rub.  Lungs:  without wheezes, rhonchi, rubs. Abdomen: Bowel sounds are normal.  Abdomen is soft and nontender with no organomegaly, hernias, masses. GU: Deferred  Extremities:  No cyanosis, clubbing, edema. Neurologic exam: Balance, Rhomberg, finger to nose testing could not be completed due to clinical state Skin: Warm & dry w/o tenting. No significant lesions.  Soft braces over the lower extremities.  See clinical summary under each active problem in the Problem List with associated updated therapeutic plan

## 2019-01-16 NOTE — Assessment & Plan Note (Signed)
Zanaflex will be discontinued as it is contraindicated with her history of falls and fracture

## 2019-01-16 NOTE — Assessment & Plan Note (Signed)
Monitor renal function because of the metformin therapy.  Present serial glucoses at the SNF indicate adequate control.

## 2019-01-16 NOTE — Assessment & Plan Note (Addendum)
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

## 2019-01-17 ENCOUNTER — Non-Acute Institutional Stay (SKILLED_NURSING_FACILITY): Payer: Medicare Other | Admitting: Internal Medicine

## 2019-01-17 ENCOUNTER — Telehealth: Payer: Self-pay | Admitting: Hematology and Oncology

## 2019-01-17 ENCOUNTER — Encounter: Payer: Self-pay | Admitting: Internal Medicine

## 2019-01-17 DIAGNOSIS — N183 Chronic kidney disease, stage 3 unspecified: Secondary | ICD-10-CM

## 2019-01-17 DIAGNOSIS — K5900 Constipation, unspecified: Secondary | ICD-10-CM | POA: Diagnosis not present

## 2019-01-17 DIAGNOSIS — R300 Dysuria: Secondary | ICD-10-CM

## 2019-01-17 NOTE — Progress Notes (Signed)
Location:    [facility] Room Number: 226/J Place of Service:  SNF [phone]) Provider:  Willette Brace, MD  Patient Care Team: [name], MD as PCP - General (Family Medicine) Nahser, Wonda Cheng, MD as PCP - Cardiology (Cardiology)  Extended Emergency Contact Information Primary Emergency Contact: [place], [place] Phone: [phone] Mobile Phone: [phone] Relation: Sister  Code Status:  Full Code Goals of care: Advanced Directive information Advanced Directives 01/17/2019  Does Patient Have a Medical Advance Directive? Yes  Type of Advance Directive (No Data)  Does patient want to make changes to medical advance directive? No - Patient declined  Would patient like information on creating a medical advance directive? -     Chief Complaint  Patient presents with   Acute Visit    Dysuria    HPI:  Pt is a 73 y.o. female seen today for an acute visit for complaints of burning with urination. She is here for short-term rehab after sustaining a fall at home with knee and ankle pain she was found to have a minor right ankle fracture and was placed in a cam walker needle immobilizer.  She is here again for short-term rehab.   Past medical history includes hypothyroidism hypertension GERD dyslipidemia sleep apnea osteoarthritis history ofadenomatous polyps as well as depression and a history of diverticulosis.  Apparently she has been complaining to nursing staff today of some burning with urination.  She also says she has not had a bowel movement in at least 2 days-she is not really complaining of any abdominal discomfort apparently she received MiraLAX earlier today.  She does not complain of any nausea or vomiting.       Past Medical History:  Diagnosis Date   Allergy    bee stings   Anxiety    Asthmatic bronchitis    Broken arm 1957/2008   right   Cataract    had surgery    Depression     Diabetes mellitus without complication ([place])    prediabetes diet controlled   Diverticulosis    GERD (gastroesophageal reflux disease)    Glaucoma    BIL   Hemorrhoids    Hx of adenomatous colonic polyps 12/02/2010   Hyperlipidemia    Hypertension    Hypothyroidism 05/2018   Inflammatory arthritis    Knee bursitis    Migraines    Obesity    Osteoarthritis    Sleep apnea    Past Surgical History:  Procedure Laterality Date   CARPAL TUNNEL RELEASE Left    left wrist   CATARACT EXTRACTION W/ INTRAOCULAR LENS  IMPLANT, BILATERAL Bilateral    1999 left, 2008 right   COLONOSCOPY     EXCISION MORTON'S NEUROMA Left 1978   left foot   GLAUCOMA SURGERY Bilateral    and laser surgery, 2004 left, 2008 right   PARTIAL HYSTERECTOMY  1991   Left an ovary   POLYPECTOMY     TOENAIL AVULSION Right    big toe    Allergies  Allergen Reactions   Aspirin Anaphylaxis   Bee Pollen     Extreme swelling due to bee stings   Codeine Nausea And Vomiting   Fish Oil Diarrhea   Sulfa Antibiotics Nausea And Vomiting   Camphor Dermatitis   Camphor Dermatitis   Lisinopril Cough   Penicillins Rash    DID THE REACTION INVOLVE: Swelling of the face/tongue/throat, SOB, or low BP? Yes Sudden or severe  rash/hives, skin peeling, or the inside of the mouth or nose? Unknown Did it require medical treatment? Unknown When did it last happen? If all above answers are NO, may proceed with cephalosporin use.    Sulfasalazine Nausea And Vomiting   Vicodin [Hydrocodone-Acetaminophen] Nausea And Vomiting    Outpatient Encounter Medications as of 01/17/2019  Medication Sig   Acetaminophen (TYLENOL ARTHRITIS PAIN PO) Take 650 mg by mouth every 6 (six) hours as needed (pain).    levothyroxine (SYNTHROID, LEVOTHROID) 50 MCG tablet Take 50 mcg by mouth daily before breakfast.    metFORMIN (GLUCOPHAGE) 500 MG tablet Take 500 mg by mouth 2 (two) times daily with a meal.      metoprolol tartrate (LOPRESSOR) 25 MG tablet Take 1 tablet (25 mg total) by mouth 2 (two) times daily.   Multiple Vitamin (MULTIVITAMIN WITH MINERALS) TABS tablet Take 1 tablet by mouth daily.    Nutritional Supplement LIQD Take 90 mLs by mouth daily.   Omeprazole-Sodium Bicarbonate (ZEGERID) 20-1100 MG CAPS capsule Take 1 capsule by mouth at bedtime.   ONETOUCH VERIO test strip USE TO TEST BLOOD GLUCOSE ONCE DAILY   pantoprazole (PROTONIX) 40 MG tablet TAKE 1 TABLET BY MOUTH  DAILY BEFORE BREAKFAST   polyvinyl alcohol (LIQUIFILM TEARS) 1.4 % ophthalmic solution Place 1 drop into both eyes every 4 (four) hours as needed for dry eyes.   potassium chloride SA (K-DUR,KLOR-CON) 20 MEQ tablet Take 40 mEq by mouth daily. Take 2 tablets to = 40 mEq   simvastatin (ZOCOR) 20 MG tablet Take 20 mg by mouth daily at 6 PM.    tiZANidine (ZANAFLEX) 2 MG tablet Take 1 mg by mouth 3 (three) times daily as needed for muscle spasms. 0.5 tablets to = 1 mg   torsemide (DEMADEX) 100 MG tablet Take 0.5 tablets (50 mg total) by mouth daily.   No facility-administered encounter medications on file as of 01/17/2019.     Review of Systems   General she is not complaining of any fever or chills.  Skin does not complain of rashes or itching.  Head ears eyes nose mouth and throat is not complaining of visual changes or sore throat.  Respiratory does not complain of a cough or shortness of breath.  Cardiac does not complain of chest pain her edema appears to be quite mild.  GI is complaining of possible constipation does not complain of abdominal pain nausea or vomiting.  GU does complain of some burning with urination at times.  Musculoskeletal is not really complaining of joint pain at this point she does have her right leg in a walker and left leg in immobilizer.  Neurologic does not complain of dizziness or headache says occasionally she will have some slight numbness in her feet but this is not  persistent.  And psych does not complain of being overtly depressed or anxious.    Immunization History  Administered Date(s) Administered   Pneumococcal Conjugate-13 02/12/2014   Tdap 12/31/2008   Pertinent  Health Maintenance Due  Topic Date Due   MAMMOGRAM  02/14/2019 (Originally 10/22/1995)   OPHTHALMOLOGY EXAM  02/14/2019 (Originally 10/22/1955)   URINE MICROALBUMIN  02/14/2019 (Originally 10/22/1955)   DEXA SCAN  02/14/2019 (Originally 10/21/2010)   PNA vac Low Risk Adult (2 of 2 - PPSV23) 02/14/2019 (Originally 02/13/2015)   HEMOGLOBIN A1C  03/10/2019   INFLUENZA VACCINE  03/30/2019   FOOT EXAM  06/20/2019   COLONOSCOPY  06/01/2023   Fall Risk  10/30/2018 07/11/2016 01/07/2016  Falls  in the past year? 0 No No   Functional Status Survey:    Temperature is 97.0 pulse 76 respirations 19 blood pressure 115/64 Physical Exam   In general this is a pleasant elderly female in no distress lying comfortably in bed.  Her skin is warm and dry.  Eyes visual acuity appears to be intact sclera and conjunctive are clear.  Oropharynx is clear mucous membranes moist.  Chest is clear to auscultation there is no labored breathing.  Heart is regular rate and rhythm without murmur gallop or rub she has slight lower extremity edema-pedal pulses are reduced.  Abdomen is somewhat obese it is soft nontender with positive bowel sounds.  GU could not really appreciate suprapubic tenderness or discharge.  Did not appreciate a rash.  Musculoskeletal she does have left leg in immobilizer boot on the right lower leg- is able to move her upper extremities at baseline.  Touch sensation is intact of her feet-she does appear to have appropriate strength of her feet when tested to opposition.  Neurologic appears grossly intact's speech is clear could not really appreciate lateralizing findings.  Psych she is alert and oriented pleasant and appropriate  Labs reviewed: Recent Labs     09/13/18 0447 09/14/18 0403 09/15/18 0502 09/15/18 0834 09/16/18 0359 09/17/18 0418 11/05/18 1028 11/28/18 0944  NA 141 139 138  --  140 140 145* 144  K 3.3* 3.0* 3.2*  --  3.5 3.7 4.5 4.0  CL 101 96* 94*  --  96* 94* 102 99  CO2 29 31 33*  --  34* 35* 23 25  GLUCOSE 179* 180* 182*  --  177* 204* 113* 119*  BUN 51* 52* 57*  --  55* 57* 29* 25  CREATININE 1.22* 1.15* 1.40*  --  1.19* 1.22* 1.29* 1.17*  CALCIUM 8.3* 8.3* 8.0*  --  8.3* 8.4* 8.1* 7.7*  MG 1.8 1.9  --  1.9  --   --   --   --   PHOS  --   --  4.1  --  3.5 3.6  --   --    Recent Labs    09/04/18 1052 09/08/18 1609 09/09/18 0824 09/15/18 0502 09/16/18 0359 09/17/18 0418  AST 13* 15 15  --   --   --   ALT 8 10 8   --   --   --   ALKPHOS 83 77 63  --   --   --   BILITOT 0.3 0.8 0.2*  --   --   --   PROT 6.4* 6.4* 5.7*  --   --   --   ALBUMIN 2.9* 3.2* 2.7* 3.0* 2.9* 3.1*   Recent Labs    09/08/18 1720  09/13/18 0447 09/14/18 0403 11/28/18 0944  WBC 8.7   < > 7.4 7.1 9.4  NEUTROABS 6.5  --  4.9 4.8  --   HGB 9.1*   < > 9.1* 8.9* 11.4  HCT 30.9*   < > 30.4* 29.4* 35.5  MCV 93.9   < > 91.0 91.0 84  PLT 261   < > 285 258 401   < > = values in this interval not displayed.   Lab Results  Component Value Date   TSH 3.265 09/09/2018   Lab Results  Component Value Date   HGBA1C 7.1 (H) 09/09/2018   No results found for: CHOL, HDL, LDLCALC, LDLDIRECT, TRIG, CHOLHDL  Significant Diagnostic Results in last 30 days:  Dg Lumbar Spine 2-3 Views  Result Date: 12/31/2018 CLINICAL DATA:  Bilateral leg pain and low back pain, initial encounter EXAM: LUMBAR SPINE - 2 VIEW COMPARISON:  11/24/2016 FINDINGS: Five lumbar type vertebral bodies are well visualized. Vertebral body height is well maintained. Mild scoliosis concave to the right is noted. Mild osteophytic changes are seen. No anterolisthesis is noted. Disc space narrowing is noted at L2-3 with osteophytic change in vacuum disc phenomena. Diffuse aortic  calcifications are seen. IMPRESSION: Degenerative change without acute abnormality. Electronically Signed   By: Inez Catalina M.D.   On: 12/31/2018 15:43   Dg Tibia/fibula Right  Result Date: 01/08/2019 CLINICAL DATA:  Initial evaluation for acute trauma, fall. EXAM: RIGHT TIBIA AND FIBULA - 2 VIEW COMPARISON:  None. FINDINGS: No acute fracture dislocation. Osteoarthritic changes noted about the knee and ankle. Mild osteopenia. No acute soft tissue abnormality. IMPRESSION: No acute osseous abnormality about the right tibia/fibula. Electronically Signed   By: Jeannine Boga M.D.   On: 01/08/2019 14:11   Dg Ankle Complete Left  Result Date: 01/08/2019 CLINICAL DATA:  Initial evaluation for acute trauma, fall. EXAM: LEFT ANKLE COMPLETE - 3+ VIEW COMPARISON:  None. FINDINGS: No acute fracture dislocation. Ankle mortise approximated. Talar dome intact. Plantar calcaneal enthesophyte noted. Bones are mildly osteopenic. No acute soft tissue abnormality. Few scattered vascular calcifications noted. IMPRESSION: No acute osseous abnormality about the left ankle. Electronically Signed   By: Jeannine Boga M.D.   On: 01/08/2019 13:50   Dg Ankle Complete Right  Result Date: 01/08/2019 CLINICAL DATA:  Initial evaluation for acute trauma, fall. EXAM: RIGHT ANKLE - COMPLETE 3+ VIEW COMPARISON:  None. FINDINGS: Tiny osseous fragment at the distal aspect of the medial malleolus, suspicious for an acute minimally displaced avulsion type fracture. Overlying soft tissue swelling. No other acute fracture or dislocation. Ankle mortise approximated. Talar dome intact. Plantar calcaneal enthesophyte noted. Mild diffuse osteopenia noted. IMPRESSION: 1. Tiny osseous fragment at the distal aspect of the medial malleolus, suspicious for an acute minimally displaced avulsion type fracture. 2. Associated soft tissue swelling about the ankle. Electronically Signed   By: Jeannine Boga M.D.   On: 01/08/2019 13:53   Ct  Knee Left Wo Contrast  Result Date: 01/08/2019 CLINICAL DATA:  Left knee pain due to an injury suffered in a fall down stairs today. Initial encounter. EXAM: CT OF THE LEFT KNEE WITHOUT CONTRAST TECHNIQUE: Multidetector CT imaging of the left knee was performed according to the standard protocol. Multiplanar CT image reconstructions were also generated. COMPARISON:  Plain films left knee 07/19/2016 and 01/08/2019. FINDINGS: Bones/Joint/Cartilage There is no fracture or dislocation. Small osteophytes are present about the knee and account for finding on plain films earlier today. Small subchondral cysts are also seen in the femoral trochlea and superior pole of the patella. Peaking of the tibial spines is noted. Ligaments Suboptimally assessed by CT. The cruciate and collateral ligaments appear intact. Muscles and Tendons Intact. Soft tissues Mild subcutaneous edema is present about the knee. No joint effusion. No Baker's cyst. IMPRESSION: Negative for fracture.  No acute abnormality. Mild to moderate osteoarthritis about the knee. Electronically Signed   By: Inge Rise M.D.   On: 01/08/2019 15:57   Dg Chest Portable 1 View  Result Date: 01/09/2019 CLINICAL DATA:  Choking EXAM: PORTABLE CHEST 1 VIEW COMPARISON:  09/08/2018 FINDINGS: Decreased lung volume with mild bibasilar atelectasis. Negative for heart failure. Probable small pleural effusions. No definite pneumonia IMPRESSION: Hypoventilation with bibasilar atelectasis and small effusions. Electronically Signed   By:  Franchot Gallo M.D.   On: 01/09/2019 14:01   Dg Knee Complete 4 Views Left  Result Date: 01/08/2019 CLINICAL DATA:  Initial evaluation for acute trauma, fall. EXAM: LEFT KNEE - COMPLETE 4+ VIEW COMPARISON:  Prior radiograph from 07/19/2016. FINDINGS: There is a subtle focal linear lucency extending through the lateral aspect of the tibial plateau, favored to be degenerative in nature, although a possible subtle acute nondisplaced  fracture not entirely excluded. No appreciable joint effusion. No other acute fracture dislocation. Moderate tricompartmental degenerative osteoarthrosis, advanced from previous. Bones are mildly osteopenic. No appreciable soft tissue injury. IMPRESSION: 1. Subtle linear lucency extending through the lateral aspect of the tibial plateau, age indeterminate. While this finding is favored to be chronic and degenerative in nature, a subtle acute nondisplaced fracture would be difficult to exclude. Correlation with physical exam recommended. Finding could be further assessed with dedicated cross-sectional imaging as indicated. 2. No other acute osseous abnormality about the knee. 3. Moderate tricompartmental degenerative osteoarthrosis, advanced from previous. Electronically Signed   By: Jeannine Boga M.D.   On: 01/08/2019 13:49   Dg Knee Complete 4 Views Right  Result Date: 01/08/2019 CLINICAL DATA:  Initial evaluation for acute trauma, fall. EXAM: RIGHT KNEE - COMPLETE 4+ VIEW COMPARISON:  None. FINDINGS: No acute fracture dislocation. No joint effusion. Moderate tricompartmental degenerative osteoarthrosis. Mild osteopenia. No acute soft tissue abnormality. IMPRESSION: 1. No acute osseous abnormality about the right knee. 2. Moderate tricompartmental degenerative osteoarthrosis. Electronically Signed   By: Jeannine Boga M.D.   On: 01/08/2019 14:14   Dg Foot Complete Right  Result Date: 01/08/2019 CLINICAL DATA:  Initial evaluation for acute trauma, fall. EXAM: RIGHT FOOT COMPLETE - 3+ VIEW COMPARISON:  Prior radiograph from 01/26/2016. FINDINGS: Tiny osseous fragment at the distal aspect of the right medial malleolus, suspicious for small avulsion type fracture, better seen on concomitant radiograph of the right ankle. No other acute fracture dislocation about the foot. Mild scattered osteoarthritic changes noted. Plantar calcaneal enthesophyte. Mild diffuse osteopenia. Soft tissue swelling again  noted about the ankle. IMPRESSION: 1. Tiny osseous fragment at the distal aspect of the right medial malleolus, suspicious for small avulsion type fracture, better seen on concomitant radiograph of the right ankle. 2. No other acute osseous abnormality about the foot. Electronically Signed   By: Jeannine Boga M.D.   On: 01/08/2019 13:56   Dg Hip Unilat W Or Wo Pelvis 2-3 Views Right  Result Date: 01/08/2019 CLINICAL DATA:  Right hip pain since a fall down stairs today. Initial encounter. EXAM: DG HIP (WITH OR WITHOUT PELVIS) 2-3V RIGHT COMPARISON:  None. FINDINGS: There is no evidence of hip fracture or dislocation. There is no evidence of arthropathy or other focal bone abnormality. IMPRESSION: Negative exam. Electronically Signed   By: Inge Rise M.D.   On: 01/08/2019 16:06    Assessment/Plan   #1 history of dysuria-will obtain a urinalysis and culture and await results clinically she appears stable otherwise.  2.  Constipation she has received MiraLAX nursing staff just gave her some prune juice at this point will monitor- she does have positive bowel sounds abdominal exam was fairly benign but this will have to be monitored. Nursing staff to monitor for results if none and appropriate time frame will need to readdress.  3- of note would like to update a CBC and BMP for updated values he did have it done back in early April showed some mild anemia with a hemoglobin of 11.4 which actually appeared  to be improved from previous levels would like to update lab work to keep an eye on this as well as her renal function-since she is on a diuretic with potassium with a history of diastolic CHF which appears stable at this point Of note she does have a diagnosis of some renal insufficiency  CPT- [phone]

## 2019-01-17 NOTE — Telephone Encounter (Signed)
Scheduled appt per sch msg. Called and left msg for patient.  °

## 2019-01-18 ENCOUNTER — Ambulatory Visit: Payer: Medicare Other | Admitting: Podiatry

## 2019-01-18 ENCOUNTER — Encounter: Payer: Self-pay | Admitting: Internal Medicine

## 2019-01-22 ENCOUNTER — Non-Acute Institutional Stay (SKILLED_NURSING_FACILITY): Payer: Medicare Other | Admitting: Adult Health

## 2019-01-22 ENCOUNTER — Encounter: Payer: Self-pay | Admitting: Adult Health

## 2019-01-22 DIAGNOSIS — R7982 Elevated C-reactive protein (CRP): Secondary | ICD-10-CM | POA: Diagnosis not present

## 2019-01-22 DIAGNOSIS — S82891D Other fracture of right lower leg, subsequent encounter for closed fracture with routine healing: Secondary | ICD-10-CM

## 2019-01-22 LAB — URINALYSIS W MICROSCOPIC + REFLEX CULTURE
Protein, Ur: NEGATIVE
Urine, pH: 7
Urobilinogen, UA: NORMAL

## 2019-01-22 NOTE — Progress Notes (Signed)
Location:  [name] Room Number: 308/A Place of Service:  SNF (31) Provider:  Durenda Age, DNP, FNP-BC  Patient Care Team: [name], MD as PCP - General (Family Medicine) Nahser, Wonda Cheng, MD as PCP - Cardiology (Cardiology)  Extended Emergency Contact Information Primary Emergency Contact: [place], [place] Phone: [phone] Mobile Phone: [phone] Relation: Sister  Code Status:  Full Code  Goals of care: Advanced Directive information Advanced Directives 01/22/2019  Does Patient Have a Medical Advance Directive? Yes  Type of Advance Directive (No Data)  Does patient want to make changes to medical advance directive? No - Patient declined  Would patient like information on creating a medical advance directive? -     Chief Complaint  Patient presents with   Acute Visit    Abmormal Labs/ Creatine Kinase 977 , Sedimentation Rate 91, C-Reactive Pro (HS) 1.76    HPI:  Pt is a 73 y.o. female seen today for an acute visit. She was seen by orthopedics this morning for follow up on her minor right  ankle fracture. She rates her pain on her right ankle and left knee as 6/10. Latest lab showed creatinine kinase 977 (elevated), c-reative protein 1.76 (elevated) and sedimentation rate westergren 91 (elevated). She said that she used to go to [facility] and consults with Dr. Ouida Sills, rheumatologist.  She has been admitted to [facility] on 01/11/19 from a recent hospitalization for a minor right ankle fracture and knee pain. She was discharged with a right foot CAM boot. 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   Past Medical History:  Diagnosis Date   Allergy    bee stings   Anxiety    Asthmatic bronchitis    Broken arm 1957/2008   right   Cataract    had surgery    Depression    Diabetes mellitus without complication ([place])    prediabetes diet controlled   Diverticulosis      GERD (gastroesophageal reflux disease)    Glaucoma    BIL   Hemorrhoids    Hx of adenomatous colonic polyps 12/02/2010   Hyperlipidemia    Hypertension    Hypothyroidism 05/2018   Inflammatory arthritis    Knee bursitis    Migraines    Obesity    Osteoarthritis    Sleep apnea    Past Surgical History:  Procedure Laterality Date   CARPAL TUNNEL RELEASE Left    left wrist   CATARACT EXTRACTION W/ INTRAOCULAR LENS  IMPLANT, BILATERAL Bilateral    1999 left, 2008 right   COLONOSCOPY     EXCISION MORTON'S NEUROMA Left 1978   left foot   GLAUCOMA SURGERY Bilateral    and laser surgery, 2004 left, 2008 right   PARTIAL HYSTERECTOMY  1991   Left an ovary   POLYPECTOMY     TOENAIL AVULSION Right    big toe    Allergies  Allergen Reactions   Aspirin Anaphylaxis   Bee Pollen     Extreme swelling due to bee stings   Codeine Nausea And Vomiting   Fish Oil Diarrhea   Sulfa Antibiotics Nausea And Vomiting   Camphor Dermatitis   Camphor Dermatitis   Lisinopril Cough   Penicillins Rash    DID THE REACTION INVOLVE: Swelling of the face/tongue/throat, SOB, or low BP? Yes Sudden or severe rash/hives, skin peeling, or the inside of the mouth or nose? Unknown Did it require medical treatment? Unknown When did it last happen?  If all above answers are NO, may proceed with cephalosporin use.    Sulfasalazine Nausea And Vomiting   Vicodin [Hydrocodone-Acetaminophen] Nausea And Vomiting    Outpatient Encounter Medications as of 01/22/2019  Medication Sig   Acetaminophen (TYLENOL ARTHRITIS PAIN PO) Take 650 mg by mouth every 6 (six) hours as needed (pain).    levothyroxine (SYNTHROID, LEVOTHROID) 50 MCG tablet Take 50 mcg by mouth daily before breakfast.    metFORMIN (GLUCOPHAGE) 500 MG tablet Take 500 mg by mouth 2 (two) times daily with a meal.    metoprolol tartrate (LOPRESSOR) 25 MG tablet Take 1 tablet (25 mg total) by mouth 2 (two)  times daily.   Multiple Vitamin (MULTIVITAMIN WITH MINERALS) TABS tablet Take 1 tablet by mouth daily.    Nutritional Supplement LIQD Take 90 mLs by mouth daily. Med Pass   ONETOUCH VERIO test strip USE TO TEST BLOOD GLUCOSE ONCE DAILY   pantoprazole (PROTONIX) 40 MG tablet TAKE 1 TABLET BY MOUTH  DAILY BEFORE BREAKFAST   polyvinyl alcohol (LIQUIFILM TEARS) 1.4 % ophthalmic solution Place 1 drop into both eyes every 4 (four) hours as needed for dry eyes.   potassium chloride SA (K-DUR,KLOR-CON) 20 MEQ tablet Take 40 mEq by mouth daily. Take 2 tablets to = 40 mEq   torsemide (DEMADEX) 100 MG tablet Take 0.5 tablets (50 mg total) by mouth daily.   [DISCONTINUED] Omeprazole-Sodium Bicarbonate (ZEGERID) 20-1100 MG CAPS capsule Take 1 capsule by mouth at bedtime.   [DISCONTINUED] simvastatin (ZOCOR) 20 MG tablet Take 20 mg by mouth daily at 6 PM.    [DISCONTINUED] tiZANidine (ZANAFLEX) 2 MG tablet Take 1 mg by mouth 3 (three) times daily as needed for muscle spasms. 0.5 tablets to = 1 mg   No facility-administered encounter medications on file as of 01/22/2019.     Review of Systems  GENERAL: No change in appetite, no fatigue, no weight changes, no fever, chills or weakness MOUTH and THROAT: Denies oral discomfort, gingival pain or bleeding RESPIRATORY: no cough, SOB, DOE, wheezing, hemoptysis CARDIAC: No chest pain or palpitations GI: No abdominal pain, diarrhea, constipation, heart burn, nausea or vomiting GU: Denies dysuria, frequency, hematuria, incontinence, or discharge NEUROLOGICAL: Denies dizziness, syncope, numbness, or headache PSYCHIATRIC: Denies feelings of depression or anxiety. No report of hallucinations, insomnia, paranoia, or agitation    Immunization History  Administered Date(s) Administered   Pneumococcal Conjugate-13 02/12/2014   Tdap 12/31/2008   Pertinent  Health Maintenance Due  Topic Date Due   MAMMOGRAM  02/14/2019 (Originally 10/22/1995)    OPHTHALMOLOGY EXAM  02/14/2019 (Originally 10/22/1955)   URINE MICROALBUMIN  02/14/2019 (Originally 10/22/1955)   DEXA SCAN  02/14/2019 (Originally 10/21/2010)   PNA vac Low Risk Adult (2 of 2 - PPSV23) 02/14/2019 (Originally 02/13/2015)   HEMOGLOBIN A1C  03/10/2019   INFLUENZA VACCINE  03/30/2019   FOOT EXAM  06/20/2019   COLONOSCOPY  06/01/2023   Fall Risk  10/30/2018 07/11/2016 01/07/2016  Falls in the past year? 0 No No     Vitals:   01/22/19 1032  BP: (!) 150/70  Pulse: 91  Resp: 20  Temp: 97.8 F (36.6 C)  TempSrc: Oral  Weight: 212 lb (96.2 kg)  Height: 5\' 2"  (1.575 m)   Body mass index is 38.78 kg/m.  Physical Exam  GENERAL APPEARANCE: Well nourished. Obese SKIN:  Skin is warm and dry.  MOUTH and THROAT: Lips are without lesions. Oral mucosa is moist and without lesions. Tongue is normal in shape, size, and color  and without lesions RESPIRATORY: Breathing is even & unlabored, BS CTAB CARDIAC: RRR, no murmur,no extra heart sounds GI: Abdomen soft, normal BS, no masses, no tenderness EXTREMITIES: Right CAM boot NEUROLOGICAL: There is no tremor. Speech is clear. Alert and oriented X 3. PSYCHIATRIC:  Affect and behavior are appropriate   Labs reviewed: Recent Labs    09/13/18 0447 09/14/18 0403 09/15/18 0502 09/15/18 0834 09/16/18 0359 09/17/18 0418 11/05/18 1028 11/28/18 0944  NA 141 139 138  --  140 140 145* 144  K 3.3* 3.0* 3.2*  --  3.5 3.7 4.5 4.0  CL 101 96* 94*  --  96* 94* 102 99  CO2 29 31 33*  --  34* 35* 23 25  GLUCOSE 179* 180* 182*  --  177* 204* 113* 119*  BUN 51* 52* 57*  --  55* 57* 29* 25  CREATININE 1.22* 1.15* 1.40*  --  1.19* 1.22* 1.29* 1.17*  CALCIUM 8.3* 8.3* 8.0*  --  8.3* 8.4* 8.1* 7.7*  MG 1.8 1.9  --  1.9  --   --   --   --   PHOS  --   --  4.1  --  3.5 3.6  --   --    Recent Labs    09/04/18 1052 09/08/18 1609 09/09/18 0824 09/15/18 0502 09/16/18 0359 09/17/18 0418  AST 13* 15 15  --   --   --   ALT 8 10 8   --    --   --   ALKPHOS 83 77 63  --   --   --   BILITOT 0.3 0.8 0.2*  --   --   --   PROT 6.4* 6.4* 5.7*  --   --   --   ALBUMIN 2.9* 3.2* 2.7* 3.0* 2.9* 3.1*   Recent Labs    09/08/18 1720  09/13/18 0447 09/14/18 0403 11/28/18 0944  WBC 8.7   < > 7.4 7.1 9.4  NEUTROABS 6.5  --  4.9 4.8  --   HGB 9.1*   < > 9.1* 8.9* 11.4  HCT 30.9*   < > 30.4* 29.4* 35.5  MCV 93.9   < > 91.0 91.0 84  PLT 261   < > 285 258 401   < > = values in this interval not displayed.   Lab Results  Component Value Date   TSH 3.265 09/09/2018   Lab Results  Component Value Date   HGBA1C 7.1 (H) 09/09/2018    Significant Diagnostic Results in last 30 days:  Dg Lumbar Spine 2-3 Views  Result Date: 12/31/2018 CLINICAL DATA:  Bilateral leg pain and low back pain, initial encounter EXAM: LUMBAR SPINE - 2 VIEW COMPARISON:  11/24/2016 FINDINGS: Five lumbar type vertebral bodies are well visualized. Vertebral body height is well maintained. Mild scoliosis concave to the right is noted. Mild osteophytic changes are seen. No anterolisthesis is noted. Disc space narrowing is noted at L2-3 with osteophytic change in vacuum disc phenomena. Diffuse aortic calcifications are seen. IMPRESSION: Degenerative change without acute abnormality. Electronically Signed   By: Inez Catalina M.D.   On: 12/31/2018 15:43   Dg Tibia/fibula Right  Result Date: 01/08/2019 CLINICAL DATA:  Initial evaluation for acute trauma, fall. EXAM: RIGHT TIBIA AND FIBULA - 2 VIEW COMPARISON:  None. FINDINGS: No acute fracture dislocation. Osteoarthritic changes noted about the knee and ankle. Mild osteopenia. No acute soft tissue abnormality. IMPRESSION: No acute osseous abnormality about the right tibia/fibula. Electronically Signed   By:  Jeannine Boga M.D.   On: 01/08/2019 14:11   Dg Ankle Complete Left  Result Date: 01/08/2019 CLINICAL DATA:  Initial evaluation for acute trauma, fall. EXAM: LEFT ANKLE COMPLETE - 3+ VIEW COMPARISON:  None.  FINDINGS: No acute fracture dislocation. Ankle mortise approximated. Talar dome intact. Plantar calcaneal enthesophyte noted. Bones are mildly osteopenic. No acute soft tissue abnormality. Few scattered vascular calcifications noted. IMPRESSION: No acute osseous abnormality about the left ankle. Electronically Signed   By: Jeannine Boga M.D.   On: 01/08/2019 13:50   Dg Ankle Complete Right  Result Date: 01/08/2019 CLINICAL DATA:  Initial evaluation for acute trauma, fall. EXAM: RIGHT ANKLE - COMPLETE 3+ VIEW COMPARISON:  None. FINDINGS: Tiny osseous fragment at the distal aspect of the medial malleolus, suspicious for an acute minimally displaced avulsion type fracture. Overlying soft tissue swelling. No other acute fracture or dislocation. Ankle mortise approximated. Talar dome intact. Plantar calcaneal enthesophyte noted. Mild diffuse osteopenia noted. IMPRESSION: 1. Tiny osseous fragment at the distal aspect of the medial malleolus, suspicious for an acute minimally displaced avulsion type fracture. 2. Associated soft tissue swelling about the ankle. Electronically Signed   By: Jeannine Boga M.D.   On: 01/08/2019 13:53   Ct Knee Left Wo Contrast  Result Date: 01/08/2019 CLINICAL DATA:  Left knee pain due to an injury suffered in a fall down stairs today. Initial encounter. EXAM: CT OF THE LEFT KNEE WITHOUT CONTRAST TECHNIQUE: Multidetector CT imaging of the left knee was performed according to the standard protocol. Multiplanar CT image reconstructions were also generated. COMPARISON:  Plain films left knee 07/19/2016 and 01/08/2019. FINDINGS: Bones/Joint/Cartilage There is no fracture or dislocation. Small osteophytes are present about the knee and account for finding on plain films earlier today. Small subchondral cysts are also seen in the femoral trochlea and superior pole of the patella. Peaking of the tibial spines is noted. Ligaments Suboptimally assessed by CT. The cruciate and  collateral ligaments appear intact. Muscles and Tendons Intact. Soft tissues Mild subcutaneous edema is present about the knee. No joint effusion. No Baker's cyst. IMPRESSION: Negative for fracture.  No acute abnormality. Mild to moderate osteoarthritis about the knee. Electronically Signed   By: Inge Rise M.D.   On: 01/08/2019 15:57   Dg Chest Portable 1 View  Result Date: 01/09/2019 CLINICAL DATA:  Choking EXAM: PORTABLE CHEST 1 VIEW COMPARISON:  09/08/2018 FINDINGS: Decreased lung volume with mild bibasilar atelectasis. Negative for heart failure. Probable small pleural effusions. No definite pneumonia IMPRESSION: Hypoventilation with bibasilar atelectasis and small effusions. Electronically Signed   By: Franchot Gallo M.D.   On: 01/09/2019 14:01   Dg Knee Complete 4 Views Left  Result Date: 01/08/2019 CLINICAL DATA:  Initial evaluation for acute trauma, fall. EXAM: LEFT KNEE - COMPLETE 4+ VIEW COMPARISON:  Prior radiograph from 07/19/2016. FINDINGS: There is a subtle focal linear lucency extending through the lateral aspect of the tibial plateau, favored to be degenerative in nature, although a possible subtle acute nondisplaced fracture not entirely excluded. No appreciable joint effusion. No other acute fracture dislocation. Moderate tricompartmental degenerative osteoarthrosis, advanced from previous. Bones are mildly osteopenic. No appreciable soft tissue injury. IMPRESSION: 1. Subtle linear lucency extending through the lateral aspect of the tibial plateau, age indeterminate. While this finding is favored to be chronic and degenerative in nature, a subtle acute nondisplaced fracture would be difficult to exclude. Correlation with physical exam recommended. Finding could be further assessed with dedicated cross-sectional imaging as indicated. 2. No other  acute osseous abnormality about the knee. 3. Moderate tricompartmental degenerative osteoarthrosis, advanced from previous. Electronically  Signed   By: Jeannine Boga M.D.   On: 01/08/2019 13:49   Dg Knee Complete 4 Views Right  Result Date: 01/08/2019 CLINICAL DATA:  Initial evaluation for acute trauma, fall. EXAM: RIGHT KNEE - COMPLETE 4+ VIEW COMPARISON:  None. FINDINGS: No acute fracture dislocation. No joint effusion. Moderate tricompartmental degenerative osteoarthrosis. Mild osteopenia. No acute soft tissue abnormality. IMPRESSION: 1. No acute osseous abnormality about the right knee. 2. Moderate tricompartmental degenerative osteoarthrosis. Electronically Signed   By: Jeannine Boga M.D.   On: 01/08/2019 14:14   Dg Foot Complete Right  Result Date: 01/08/2019 CLINICAL DATA:  Initial evaluation for acute trauma, fall. EXAM: RIGHT FOOT COMPLETE - 3+ VIEW COMPARISON:  Prior radiograph from 01/26/2016. FINDINGS: Tiny osseous fragment at the distal aspect of the right medial malleolus, suspicious for small avulsion type fracture, better seen on concomitant radiograph of the right ankle. No other acute fracture dislocation about the foot. Mild scattered osteoarthritic changes noted. Plantar calcaneal enthesophyte. Mild diffuse osteopenia. Soft tissue swelling again noted about the ankle. IMPRESSION: 1. Tiny osseous fragment at the distal aspect of the right medial malleolus, suspicious for small avulsion type fracture, better seen on concomitant radiograph of the right ankle. 2. No other acute osseous abnormality about the foot. Electronically Signed   By: Jeannine Boga M.D.   On: 01/08/2019 13:56   Dg Hip Unilat W Or Wo Pelvis 2-3 Views Right  Result Date: 01/08/2019 CLINICAL DATA:  Right hip pain since a fall down stairs today. Initial encounter. EXAM: DG HIP (WITH OR WITHOUT PELVIS) 2-3V RIGHT COMPARISON:  None. FINDINGS: There is no evidence of hip fracture or dislocation. There is no evidence of arthropathy or other focal bone abnormality. IMPRESSION: Negative exam. Electronically Signed   By: Inge Rise M.D.    On: 01/08/2019 16:06    Assessment/Plan  1. Elevated C-reactive protein (CRP) - CRP 1.76, CK 977 and sedimentation rate westergren 91, has pain on right ankle and left knee, will refer to rheumatology at [facility]  2. Closed fracture of right ankle with routine healing, subsequent encounter - continue CAM boot, followed up with orthopedics today, continue Tylenol arthritis ER 650 mg 1 tab Q 6 hours PRN   Family/ staff Communication: Discussed plan of care with resident and charge nurse.  Labs/tests ordered: None  Goals of care:   Short-term care   Durenda Age, DNP, FNP-BC [facility] [phone] (Monday-Friday 8:00 a.m. - 5:00 p.m.) [phone] (after hours)

## 2019-01-28 ENCOUNTER — Encounter: Payer: Self-pay | Admitting: Internal Medicine

## 2019-01-28 ENCOUNTER — Non-Acute Institutional Stay (SKILLED_NURSING_FACILITY): Payer: Medicare Other | Admitting: Internal Medicine

## 2019-01-28 DIAGNOSIS — K219 Gastro-esophageal reflux disease without esophagitis: Secondary | ICD-10-CM | POA: Diagnosis not present

## 2019-01-28 DIAGNOSIS — R112 Nausea with vomiting, unspecified: Secondary | ICD-10-CM

## 2019-01-28 DIAGNOSIS — E118 Type 2 diabetes mellitus with unspecified complications: Secondary | ICD-10-CM | POA: Diagnosis not present

## 2019-01-28 NOTE — Progress Notes (Signed)
Location:    [facility] Room Number: 119/A Place of Service:  SNF [phone]) Provider:  Willette Brace, MD  Patient Care Team: [name], MD as PCP - General (Family Medicine) Nahser, Wonda Cheng, MD as PCP - Cardiology (Cardiology)  Extended Emergency Contact Information Primary Emergency Contact: [place], [place] Phone: [phone] Mobile Phone: [phone] Relation: Sister  Code Status:  Full Code Goals of care: Advanced Directive information Advanced Directives 01/28/2019  Does Patient Have a Medical Advance Directive? Yes  Type of Advance Directive (No Data)  Does patient want to make changes to medical advance directive? No - Patient declined  Would patient like information on creating a medical advance directive? -     Chief Complaint  Patient presents with   Acute Visit    Vomiting    HPI:  Pt is a 73 y.o. female seen today for an acute visit for a vomiting episode. She is here for short-term rehab after having a fall at home with knee and ankle pain and found to have a minor right ankle fracture and was placed in a cam walker.  She also has a history of hypothyroidism hypertension GERD dyslipidemia sleep apnea osteoarthritis history of addendum as polyps as well as depression and a history of diverticulosis.  Apparently early this afternoon she did have a vomiting episode apparently was undigested food- she states earlier in the day she been feeling somewhat nauseous as well.  Apparently she ate breakfast and some lunch  She is not complaining of any acute abdominal pain she says her stomach feels somewhat sore.  She denies any shortness of breath or chest pain.  Nursing staff feels it may have been something she ate although patient does give somewhat of a history of feeling nauseous for attempting to eat lunch.  She does have a history of GERD and is on Protonix.  Vital signs appear to be  stable      Past Medical History:  Diagnosis Date   Allergy    bee stings   Anxiety    Asthmatic bronchitis    Broken arm 1957/2008   right   Cataract    had surgery    Depression    Diabetes mellitus without complication ([place])    prediabetes diet controlled   Diverticulosis    GERD (gastroesophageal reflux disease)    Glaucoma    BIL   Hemorrhoids    Hx of adenomatous colonic polyps 12/02/2010   Hyperlipidemia    Hypertension    Hypothyroidism 05/2018   Inflammatory arthritis    Knee bursitis    Migraines    Obesity    Osteoarthritis    Sleep apnea    Past Surgical History:  Procedure Laterality Date   CARPAL TUNNEL RELEASE Left    left wrist   CATARACT EXTRACTION W/ INTRAOCULAR LENS  IMPLANT, BILATERAL Bilateral    1999 left, 2008 right   COLONOSCOPY     EXCISION MORTON'S NEUROMA Left 1978   left foot   GLAUCOMA SURGERY Bilateral    and laser surgery, 2004 left, 2008 right   PARTIAL HYSTERECTOMY  1991   Left an ovary   POLYPECTOMY     TOENAIL AVULSION Right    big toe    Allergies  Allergen Reactions   Aspirin Anaphylaxis   Bee Pollen     Extreme swelling due to bee stings   Codeine Nausea And Vomiting   Fish Oil  Diarrhea   Sulfa Antibiotics Nausea And Vomiting   Camphor Dermatitis   Camphor Dermatitis   Lisinopril Cough   Penicillins Rash    DID THE REACTION INVOLVE: Swelling of the face/tongue/throat, SOB, or low BP? Yes Sudden or severe rash/hives, skin peeling, or the inside of the mouth or nose? Unknown Did it require medical treatment? Unknown When did it last happen? If all above answers are NO, may proceed with cephalosporin use.    Sulfasalazine Nausea And Vomiting   Vicodin [Hydrocodone-Acetaminophen] Nausea And Vomiting    Outpatient Encounter Medications as of 01/28/2019  Medication Sig   Acetaminophen (TYLENOL ARTHRITIS PAIN PO) Take 650 mg by mouth every 6 (six) hours as needed  (pain).    levothyroxine (SYNTHROID, LEVOTHROID) 50 MCG tablet Take 50 mcg by mouth daily before breakfast.    metFORMIN (GLUCOPHAGE) 500 MG tablet Take 500 mg by mouth 2 (two) times daily with a meal.    metoprolol tartrate (LOPRESSOR) 25 MG tablet Take 1 tablet (25 mg total) by mouth 2 (two) times daily.   Multiple Vitamin (MULTIVITAMIN WITH MINERALS) TABS tablet Take 1 tablet by mouth daily.    Nutritional Supplement LIQD Take 90 mLs by mouth daily. Med Pass   ondansetron (ZOFRAN) 4 MG tablet GIVE 1 TABLET BY MOUTH EVERY 6 HOURS AS NEEDED FOR NAUSEA/VOMITING   ONETOUCH VERIO test strip USE TO TEST BLOOD GLUCOSE ONCE DAILY   pantoprazole (PROTONIX) 40 MG tablet TAKE 1 TABLET BY MOUTH  DAILY BEFORE BREAKFAST   polyvinyl alcohol (LIQUIFILM TEARS) 1.4 % ophthalmic solution Place 1 drop into both eyes every 4 (four) hours as needed for dry eyes.   potassium chloride SA (K-DUR,KLOR-CON) 20 MEQ tablet Take 40 mEq by mouth daily. Take 2 tablets to = 40 mEq   torsemide (DEMADEX) 100 MG tablet Take 0.5 tablets (50 mg total) by mouth daily.   No facility-administered encounter medications on file as of 01/28/2019.     Review of Systems  In general she does not complain of fever chills just says she does not feel all that great.   Skin does not complain of diaphoresis itching or rashes.  Head ears eyes nose mouth and throat is not really complaining of any visual changes or sore throat.  Respiratory denies shortness of breath or cough.  Cardiac does not complain of any chest pain.   GI does complain of some nausea and a vomiting episode as noted above does not complain of constipation apparently she had a medium-sized bowel movement earlier today without evidence of diarrhea.  GU is not really complaining of dysuria she did have a urine culture done approximately a week ago which showed mixed flora.  Musculoskeletal does not complain of joint pain at this time she does have the  minor right ankle fracture.  Neurologic does not complain of dizziness headache syncope says she feels somewhat weak however.  Psych does have somewhat of a flat affect says she is just not feeling that great but does not complain of overt depression or anxiety.   Immunization History  Administered Date(s) Administered   Pneumococcal Conjugate-13 02/12/2014   Tdap 12/31/2008   Pertinent  Health Maintenance Due  Topic Date Due   MAMMOGRAM  02/14/2019 (Originally 10/22/1995)   OPHTHALMOLOGY EXAM  02/14/2019 (Originally 10/22/1955)   URINE MICROALBUMIN  02/14/2019 (Originally 10/22/1955)   DEXA SCAN  02/14/2019 (Originally 10/21/2010)   PNA vac Low Risk Adult (2 of 2 - PPSV23) 02/14/2019 (Originally 02/13/2015)   HEMOGLOBIN A1C  03/10/2019   INFLUENZA VACCINE  03/30/2019   FOOT EXAM  06/20/2019   COLONOSCOPY  06/01/2023   Fall Risk  10/30/2018 07/11/2016 01/07/2016  Falls in the past year? 0 No No   Functional Status Survey:    Temperature is 97.0 pulse 91 respirations 20 blood pressure 106/71  Physical Exam   In general this is a pleasant elderly female in no distress but does appear somewhat weak.  Her skin is warm and dry she is not diaphoretic.  Eyes visual acuity appears to be intact sclera and conjunctive are clear- oropharynx is clear mucous membranes moist.  Chest is clear to auscultation there is no labored breathing.  Heart is regular rate and rhythm without murmur gallop or rub--.  Abdomen continues to be somewhat obese it is soft does not appear acutely tender but says it feels somewhat generally sore when I palpate it bowel sounds are active.  Musculoskeletal continues to have right lower leg in a boot- moves her extremities it appears at relative baseline.  Neurologic appears grossly intact her speech is clear cannot appreciate lateralizing findings.  Psych she is alert and oriented pleasant and appropriate  Labs reviewed: Recent Labs     09/13/18 0447 09/14/18 0403 09/15/18 0502 09/15/18 0834 09/16/18 0359 09/17/18 0418 11/05/18 1028 11/28/18 0944  NA 141 139 138  --  140 140 145* 144  K 3.3* 3.0* 3.2*  --  3.5 3.7 4.5 4.0  CL 101 96* 94*  --  96* 94* 102 99  CO2 29 31 33*  --  34* 35* 23 25  GLUCOSE 179* 180* 182*  --  177* 204* 113* 119*  BUN 51* 52* 57*  --  55* 57* 29* 25  CREATININE 1.22* 1.15* 1.40*  --  1.19* 1.22* 1.29* 1.17*  CALCIUM 8.3* 8.3* 8.0*  --  8.3* 8.4* 8.1* 7.7*  MG 1.8 1.9  --  1.9  --   --   --   --   PHOS  --   --  4.1  --  3.5 3.6  --   --    Recent Labs    09/04/18 1052 09/08/18 1609 09/09/18 0824 09/15/18 0502 09/16/18 0359 09/17/18 0418  AST 13* 15 15  --   --   --   ALT 8 10 8   --   --   --   ALKPHOS 83 77 63  --   --   --   BILITOT 0.3 0.8 0.2*  --   --   --   PROT 6.4* 6.4* 5.7*  --   --   --   ALBUMIN 2.9* 3.2* 2.7* 3.0* 2.9* 3.1*   Recent Labs    09/08/18 1720  09/13/18 0447 09/14/18 0403 11/28/18 0944  WBC 8.7   < > 7.4 7.1 9.4  NEUTROABS 6.5  --  4.9 4.8  --   HGB 9.1*   < > 9.1* 8.9* 11.4  HCT 30.9*   < > 30.4* 29.4* 35.5  MCV 93.9   < > 91.0 91.0 84  PLT 261   < > 285 258 401   < > = values in this interval not displayed.   Lab Results  Component Value Date   TSH 3.265 09/09/2018   Lab Results  Component Value Date   HGBA1C 7.1 (H) 09/09/2018   No results found for: CHOL, HDL, LDLCALC, LDLDIRECT, TRIG, CHOLHDL  Significant Diagnostic Results in last 30 days:  Dg Lumbar Spine 2-3 Views  Result  Date: 12/31/2018 CLINICAL DATA:  Bilateral leg pain and low back pain, initial encounter EXAM: LUMBAR SPINE - 2 VIEW COMPARISON:  11/24/2016 FINDINGS: Five lumbar type vertebral bodies are well visualized. Vertebral body height is well maintained. Mild scoliosis concave to the right is noted. Mild osteophytic changes are seen. No anterolisthesis is noted. Disc space narrowing is noted at L2-3 with osteophytic change in vacuum disc phenomena. Diffuse aortic  calcifications are seen. IMPRESSION: Degenerative change without acute abnormality. Electronically Signed   By: Inez Catalina M.D.   On: 12/31/2018 15:43   Dg Tibia/fibula Right  Result Date: 01/08/2019 CLINICAL DATA:  Initial evaluation for acute trauma, fall. EXAM: RIGHT TIBIA AND FIBULA - 2 VIEW COMPARISON:  None. FINDINGS: No acute fracture dislocation. Osteoarthritic changes noted about the knee and ankle. Mild osteopenia. No acute soft tissue abnormality. IMPRESSION: No acute osseous abnormality about the right tibia/fibula. Electronically Signed   By: Jeannine Boga M.D.   On: 01/08/2019 14:11   Dg Ankle Complete Left  Result Date: 01/08/2019 CLINICAL DATA:  Initial evaluation for acute trauma, fall. EXAM: LEFT ANKLE COMPLETE - 3+ VIEW COMPARISON:  None. FINDINGS: No acute fracture dislocation. Ankle mortise approximated. Talar dome intact. Plantar calcaneal enthesophyte noted. Bones are mildly osteopenic. No acute soft tissue abnormality. Few scattered vascular calcifications noted. IMPRESSION: No acute osseous abnormality about the left ankle. Electronically Signed   By: Jeannine Boga M.D.   On: 01/08/2019 13:50   Dg Ankle Complete Right  Result Date: 01/08/2019 CLINICAL DATA:  Initial evaluation for acute trauma, fall. EXAM: RIGHT ANKLE - COMPLETE 3+ VIEW COMPARISON:  None. FINDINGS: Tiny osseous fragment at the distal aspect of the medial malleolus, suspicious for an acute minimally displaced avulsion type fracture. Overlying soft tissue swelling. No other acute fracture or dislocation. Ankle mortise approximated. Talar dome intact. Plantar calcaneal enthesophyte noted. Mild diffuse osteopenia noted. IMPRESSION: 1. Tiny osseous fragment at the distal aspect of the medial malleolus, suspicious for an acute minimally displaced avulsion type fracture. 2. Associated soft tissue swelling about the ankle. Electronically Signed   By: Jeannine Boga M.D.   On: 01/08/2019 13:53   Ct  Knee Left Wo Contrast  Result Date: 01/08/2019 CLINICAL DATA:  Left knee pain due to an injury suffered in a fall down stairs today. Initial encounter. EXAM: CT OF THE LEFT KNEE WITHOUT CONTRAST TECHNIQUE: Multidetector CT imaging of the left knee was performed according to the standard protocol. Multiplanar CT image reconstructions were also generated. COMPARISON:  Plain films left knee 07/19/2016 and 01/08/2019. FINDINGS: Bones/Joint/Cartilage There is no fracture or dislocation. Small osteophytes are present about the knee and account for finding on plain films earlier today. Small subchondral cysts are also seen in the femoral trochlea and superior pole of the patella. Peaking of the tibial spines is noted. Ligaments Suboptimally assessed by CT. The cruciate and collateral ligaments appear intact. Muscles and Tendons Intact. Soft tissues Mild subcutaneous edema is present about the knee. No joint effusion. No Baker's cyst. IMPRESSION: Negative for fracture.  No acute abnormality. Mild to moderate osteoarthritis about the knee. Electronically Signed   By: Inge Rise M.D.   On: 01/08/2019 15:57   Dg Chest Portable 1 View  Result Date: 01/09/2019 CLINICAL DATA:  Choking EXAM: PORTABLE CHEST 1 VIEW COMPARISON:  09/08/2018 FINDINGS: Decreased lung volume with mild bibasilar atelectasis. Negative for heart failure. Probable small pleural effusions. No definite pneumonia IMPRESSION: Hypoventilation with bibasilar atelectasis and small effusions. Electronically Signed   By: Juanda Crumble  Carlis Abbott M.D.   On: 01/09/2019 14:01   Dg Knee Complete 4 Views Left  Result Date: 01/08/2019 CLINICAL DATA:  Initial evaluation for acute trauma, fall. EXAM: LEFT KNEE - COMPLETE 4+ VIEW COMPARISON:  Prior radiograph from 07/19/2016. FINDINGS: There is a subtle focal linear lucency extending through the lateral aspect of the tibial plateau, favored to be degenerative in nature, although a possible subtle acute nondisplaced  fracture not entirely excluded. No appreciable joint effusion. No other acute fracture dislocation. Moderate tricompartmental degenerative osteoarthrosis, advanced from previous. Bones are mildly osteopenic. No appreciable soft tissue injury. IMPRESSION: 1. Subtle linear lucency extending through the lateral aspect of the tibial plateau, age indeterminate. While this finding is favored to be chronic and degenerative in nature, a subtle acute nondisplaced fracture would be difficult to exclude. Correlation with physical exam recommended. Finding could be further assessed with dedicated cross-sectional imaging as indicated. 2. No other acute osseous abnormality about the knee. 3. Moderate tricompartmental degenerative osteoarthrosis, advanced from previous. Electronically Signed   By: Jeannine Boga M.D.   On: 01/08/2019 13:49   Dg Knee Complete 4 Views Right  Result Date: 01/08/2019 CLINICAL DATA:  Initial evaluation for acute trauma, fall. EXAM: RIGHT KNEE - COMPLETE 4+ VIEW COMPARISON:  None. FINDINGS: No acute fracture dislocation. No joint effusion. Moderate tricompartmental degenerative osteoarthrosis. Mild osteopenia. No acute soft tissue abnormality. IMPRESSION: 1. No acute osseous abnormality about the right knee. 2. Moderate tricompartmental degenerative osteoarthrosis. Electronically Signed   By: Jeannine Boga M.D.   On: 01/08/2019 14:14   Dg Foot Complete Right  Result Date: 01/08/2019 CLINICAL DATA:  Initial evaluation for acute trauma, fall. EXAM: RIGHT FOOT COMPLETE - 3+ VIEW COMPARISON:  Prior radiograph from 01/26/2016. FINDINGS: Tiny osseous fragment at the distal aspect of the right medial malleolus, suspicious for small avulsion type fracture, better seen on concomitant radiograph of the right ankle. No other acute fracture dislocation about the foot. Mild scattered osteoarthritic changes noted. Plantar calcaneal enthesophyte. Mild diffuse osteopenia. Soft tissue swelling again  noted about the ankle. IMPRESSION: 1. Tiny osseous fragment at the distal aspect of the right medial malleolus, suspicious for small avulsion type fracture, better seen on concomitant radiograph of the right ankle. 2. No other acute osseous abnormality about the foot. Electronically Signed   By: Jeannine Boga M.D.   On: 01/08/2019 13:56   Dg Hip Unilat W Or Wo Pelvis 2-3 Views Right  Result Date: 01/08/2019 CLINICAL DATA:  Right hip pain since a fall down stairs today. Initial encounter. EXAM: DG HIP (WITH OR WITHOUT PELVIS) 2-3V RIGHT COMPARISON:  None. FINDINGS: There is no evidence of hip fracture or dislocation. There is no evidence of arthropathy or other focal bone abnormality. IMPRESSION: Negative exam. Electronically Signed   By: Inge Rise M.D.   On: 01/08/2019 16:06    Assessment/Plan  #1 nausea with vomiting- at this point will monitor will start Zofran 4 mg every 6 hours as needed.--And initiate a clear liquid diet x24 hours  Also monitor closely this afternoon with vital signs every shift-I have spoken extensively to her nurse who will continue to monitor her.  If nausea vomiting abdominal pain persists or worsens to notify provider.  Also will update lab work including a CBC with differential and metabolic panel tomorrow a.m.-.  Also will check CBGs before meals and at bedtime over the next 24 hoursbecause she is on a clear liquid diet- she does continue on Glucophage--  #2-history of GERD she is on  Protonix this will have to be monitored at one point she also appears to have been on Zegerid as well    [id number].  Addendum  Of note nursing has apprised me that patient appears to be doing well with no further vomiting episodes at this point have been advised to continue to monitor hopefully we can titrate up her diet tomorrow and she will be feeling better but this will have to be watched She also will have follow-up with provider in facility  tomorrow.

## 2019-01-29 ENCOUNTER — Encounter: Payer: Self-pay | Admitting: Internal Medicine

## 2019-01-29 ENCOUNTER — Encounter: Payer: Self-pay | Admitting: Adult Health

## 2019-01-29 ENCOUNTER — Encounter: Payer: Self-pay | Admitting: Physician Assistant

## 2019-01-29 ENCOUNTER — Non-Acute Institutional Stay (SKILLED_NURSING_FACILITY): Payer: Medicare Other | Admitting: Adult Health

## 2019-01-29 DIAGNOSIS — R112 Nausea with vomiting, unspecified: Secondary | ICD-10-CM | POA: Diagnosis not present

## 2019-01-29 DIAGNOSIS — D72829 Elevated white blood cell count, unspecified: Secondary | ICD-10-CM

## 2019-01-29 NOTE — Progress Notes (Addendum)
Location:  [facility] Room Number: 119/A Place of Service:  SNF (31) Provider:  Durenda Age, DNP, FNP-BC  Patient Care Team: [name], MD as PCP - General (Family Medicine) Nahser, Wonda Cheng, MD as PCP - Cardiology (Cardiology)  Extended Emergency Contact Information Primary Emergency Contact: [place], [place] Phone: [phone] Mobile Phone: [phone] Relation: Sister  Code Status:  Full Code  Goals of care: Advanced Directive information Advanced Directives 01/29/2019  Does Patient Have a Medical Advance Directive? Yes  Type of Advance Directive (No Data)  Does patient want to make changes to medical advance directive? -  Would patient like information on creating a medical advance directive? -     Chief Complaint  Patient presents with   Acute Visit    F/U Vomiting    HPI:  Pt is a 73 y.o. female seen today for to follow-up vomiting episodes yesterday. She was put on clear liquid diets X 24 hours. No episode of vomiting today. Resident said that she feels weak but no longer nauseous. Latest labs - wbc 16.4 (elevated), hemoglobin 10.4, hematocrit 32.1, platelet 445, glucose 157 BUN 37.5 creatinine 1.07 sodium 135  K5.1. No reported fever. She is requesting if she can start eating regular food now.    Past Medical History:  Diagnosis Date   Allergy    bee stings   Anxiety    Asthmatic bronchitis    Broken arm 1957/2008   right   Cataract    had surgery    Depression    Diabetes mellitus without complication ([place])    prediabetes diet controlled   Diverticulosis    GERD (gastroesophageal reflux disease)    Glaucoma    BIL   Hemorrhoids    Hx of adenomatous colonic polyps 12/02/2010   Hyperlipidemia    Hypertension    Hypothyroidism 05/2018   Inflammatory arthritis    Knee bursitis    Migraines    Obesity    Osteoarthritis    Sleep apnea    Past Surgical History:  Procedure Laterality  Date   CARPAL TUNNEL RELEASE Left    left wrist   CATARACT EXTRACTION W/ INTRAOCULAR LENS  IMPLANT, BILATERAL Bilateral    1999 left, 2008 right   COLONOSCOPY     EXCISION MORTON'S NEUROMA Left 1978   left foot   GLAUCOMA SURGERY Bilateral    and laser surgery, 2004 left, 2008 right   PARTIAL HYSTERECTOMY  1991   Left an ovary   POLYPECTOMY     TOENAIL AVULSION Right    big toe    Allergies  Allergen Reactions   Aspirin Anaphylaxis   Bee Pollen     Extreme swelling due to bee stings   Codeine Nausea And Vomiting   Fish Oil Diarrhea   Sulfa Antibiotics Nausea And Vomiting   Camphor Dermatitis   Camphor Dermatitis   Lisinopril Cough   Penicillins Rash    DID THE REACTION INVOLVE: Swelling of the face/tongue/throat, SOB, or low BP? Yes Sudden or severe rash/hives, skin peeling, or the inside of the mouth or nose? Unknown Did it require medical treatment? Unknown When did it last happen? If all above answers are NO, may proceed with cephalosporin use.    Sulfasalazine Nausea And Vomiting   Vicodin [Hydrocodone-Acetaminophen] Nausea And Vomiting    Outpatient Encounter Medications as of 01/29/2019  Medication Sig   Acetaminophen (TYLENOL ARTHRITIS PAIN PO) Take 650 mg by mouth every 6 (six) hours as needed (  pain).    levothyroxine (SYNTHROID, LEVOTHROID) 50 MCG tablet Take 50 mcg by mouth daily before breakfast.    metFORMIN (GLUCOPHAGE) 500 MG tablet Take 500 mg by mouth 2 (two) times daily with a meal.    metoprolol tartrate (LOPRESSOR) 25 MG tablet Take 1 tablet (25 mg total) by mouth 2 (two) times daily.   Multiple Vitamin (MULTIVITAMIN WITH MINERALS) TABS tablet Take 1 tablet by mouth daily.    Nutritional Supplement LIQD Take 90 mLs by mouth daily. Med Pass   ondansetron (ZOFRAN) 4 MG tablet GIVE 1 TABLET BY MOUTH EVERY 6 HOURS AS NEEDED FOR NAUSEA/VOMITING   ONETOUCH VERIO test strip USE TO TEST BLOOD GLUCOSE ONCE DAILY    pantoprazole (PROTONIX) 40 MG tablet TAKE 1 TABLET BY MOUTH  DAILY BEFORE BREAKFAST   polyvinyl alcohol (LIQUIFILM TEARS) 1.4 % ophthalmic solution Place 1 drop into both eyes every 4 (four) hours as needed for dry eyes.   potassium chloride SA (K-DUR,KLOR-CON) 20 MEQ tablet Take 40 mEq by mouth daily. Take 2 tablets to = 40 mEq   torsemide (DEMADEX) 100 MG tablet Take 0.5 tablets (50 mg total) by mouth daily.   No facility-administered encounter medications on file as of 01/29/2019.     Review of Systems  GENERAL: +weakness MOUTH and THROAT: Denies oral discomfort, gingival pain or bleeding RESPIRATORY: no cough, SOB, DOE, wheezing, hemoptysis CARDIAC: No chest pain, edema or palpitations GI: + vomiting GU: Denies dysuria, frequency, hematuria, incontinence, or discharge NEUROLOGICAL: Denies dizziness, syncope, numbness, or headache PSYCHIATRIC: Denies feelings of depression or anxiety. No report of hallucinations, insomnia, paranoia, or agitation   Immunization History  Administered Date(s) Administered   Pneumococcal Conjugate-13 02/12/2014   Tdap 12/31/2008   Pertinent  Health Maintenance Due  Topic Date Due   MAMMOGRAM  02/14/2019 (Originally 10/22/1995)   OPHTHALMOLOGY EXAM  02/14/2019 (Originally 10/22/1955)   URINE MICROALBUMIN  02/14/2019 (Originally 10/22/1955)   DEXA SCAN  02/14/2019 (Originally 10/21/2010)   PNA vac Low Risk Adult (2 of 2 - PPSV23) 02/14/2019 (Originally 02/13/2015)   HEMOGLOBIN A1C  03/10/2019   INFLUENZA VACCINE  03/30/2019   FOOT EXAM  06/20/2019   COLONOSCOPY  06/01/2023   Fall Risk  10/30/2018 07/11/2016 01/07/2016  Falls in the past year? 0 No No     Vitals:   01/29/19 2328  BP: 101/61  Pulse: 85  Resp: 20  Temp: (!) 97.1 F (36.2 C)  Weight: 212 lb (96.2 kg)  Height: 5\' 2"  (1.575 m)   Body mass index is 38.78 kg/m.  Physical Exam  GENERAL APPEARANCE: Well nourished. In no acute distress. Obese SKIN:  Skin is warm and  dry.  MOUTH and THROAT: Lips are without lesions. Oral mucosa is moist and without lesions. Tongue is normal in shape, size, and color and without lesions RESPIRATORY: Breathing is even & unlabored, BS CTAB CARDIAC: RRR, no murmur,no extra heart sounds, no edema GI: Abdomen soft, normal BS, no masses, no tenderness EXTREMITIES:  Right foot on CAM boot NEUROLOGICAL: There is no tremor. Speech is clear. Alert and oriented X 3. PSYCHIATRIC:  Affect and behavior are appropriate   Labs reviewed: Recent Labs    09/13/18 0447 09/14/18 0403 09/15/18 0502 09/15/18 0834 09/16/18 0359 09/17/18 0418 11/05/18 1028 11/28/18 0944  NA 141 139 138  --  140 140 145* 144  K 3.3* 3.0* 3.2*  --  3.5 3.7 4.5 4.0  CL 101 96* 94*  --  96* 94* 102 99  CO2  29 31 33*  --  34* 35* 23 25  GLUCOSE 179* 180* 182*  --  177* 204* 113* 119*  BUN 51* 52* 57*  --  55* 57* 29* 25  CREATININE 1.22* 1.15* 1.40*  --  1.19* 1.22* 1.29* 1.17*  CALCIUM 8.3* 8.3* 8.0*  --  8.3* 8.4* 8.1* 7.7*  MG 1.8 1.9  --  1.9  --   --   --   --   PHOS  --   --  4.1  --  3.5 3.6  --   --    Recent Labs    09/04/18 1052 09/08/18 1609 09/09/18 0824 09/15/18 0502 09/16/18 0359 09/17/18 0418  AST 13* 15 15  --   --   --   ALT 8 10 8   --   --   --   ALKPHOS 83 77 63  --   --   --   BILITOT 0.3 0.8 0.2*  --   --   --   PROT 6.4* 6.4* 5.7*  --   --   --   ALBUMIN 2.9* 3.2* 2.7* 3.0* 2.9* 3.1*   Recent Labs    09/08/18 1720  09/13/18 0447 09/14/18 0403 11/28/18 0944  WBC 8.7   < > 7.4 7.1 9.4  NEUTROABS 6.5  --  4.9 4.8  --   HGB 9.1*   < > 9.1* 8.9* 11.4  HCT 30.9*   < > 30.4* 29.4* 35.5  MCV 93.9   < > 91.0 91.0 84  PLT 261   < > 285 258 401   < > = values in this interval not displayed.   Lab Results  Component Value Date   TSH 3.265 09/09/2018   Lab Results  Component Value Date   HGBA1C 7.1 (H) 09/09/2018     Significant Diagnostic Results in last 30 days:  Dg Tibia/fibula Right  Result Date:  01/08/2019 CLINICAL DATA:  Initial evaluation for acute trauma, fall. EXAM: RIGHT TIBIA AND FIBULA - 2 VIEW COMPARISON:  None. FINDINGS: No acute fracture dislocation. Osteoarthritic changes noted about the knee and ankle. Mild osteopenia. No acute soft tissue abnormality. IMPRESSION: No acute osseous abnormality about the right tibia/fibula. Electronically Signed   By: Jeannine Boga M.D.   On: 01/08/2019 14:11   Dg Ankle Complete Left  Result Date: 01/08/2019 CLINICAL DATA:  Initial evaluation for acute trauma, fall. EXAM: LEFT ANKLE COMPLETE - 3+ VIEW COMPARISON:  None. FINDINGS: No acute fracture dislocation. Ankle mortise approximated. Talar dome intact. Plantar calcaneal enthesophyte noted. Bones are mildly osteopenic. No acute soft tissue abnormality. Few scattered vascular calcifications noted. IMPRESSION: No acute osseous abnormality about the left ankle. Electronically Signed   By: Jeannine Boga M.D.   On: 01/08/2019 13:50   Dg Ankle Complete Right  Result Date: 01/08/2019 CLINICAL DATA:  Initial evaluation for acute trauma, fall. EXAM: RIGHT ANKLE - COMPLETE 3+ VIEW COMPARISON:  None. FINDINGS: Tiny osseous fragment at the distal aspect of the medial malleolus, suspicious for an acute minimally displaced avulsion type fracture. Overlying soft tissue swelling. No other acute fracture or dislocation. Ankle mortise approximated. Talar dome intact. Plantar calcaneal enthesophyte noted. Mild diffuse osteopenia noted. IMPRESSION: 1. Tiny osseous fragment at the distal aspect of the medial malleolus, suspicious for an acute minimally displaced avulsion type fracture. 2. Associated soft tissue swelling about the ankle. Electronically Signed   By: Jeannine Boga M.D.   On: 01/08/2019 13:53   Ct Knee Left Wo Contrast  Result Date: 01/08/2019 CLINICAL DATA:  Left knee pain due to an injury suffered in a fall down stairs today. Initial encounter. EXAM: CT OF THE LEFT KNEE WITHOUT  CONTRAST TECHNIQUE: Multidetector CT imaging of the left knee was performed according to the standard protocol. Multiplanar CT image reconstructions were also generated. COMPARISON:  Plain films left knee 07/19/2016 and 01/08/2019. FINDINGS: Bones/Joint/Cartilage There is no fracture or dislocation. Small osteophytes are present about the knee and account for finding on plain films earlier today. Small subchondral cysts are also seen in the femoral trochlea and superior pole of the patella. Peaking of the tibial spines is noted. Ligaments Suboptimally assessed by CT. The cruciate and collateral ligaments appear intact. Muscles and Tendons Intact. Soft tissues Mild subcutaneous edema is present about the knee. No joint effusion. No Baker's cyst. IMPRESSION: Negative for fracture.  No acute abnormality. Mild to moderate osteoarthritis about the knee. Electronically Signed   By: Inge Rise M.D.   On: 01/08/2019 15:57   Dg Chest Portable 1 View  Result Date: 01/09/2019 CLINICAL DATA:  Choking EXAM: PORTABLE CHEST 1 VIEW COMPARISON:  09/08/2018 FINDINGS: Decreased lung volume with mild bibasilar atelectasis. Negative for heart failure. Probable small pleural effusions. No definite pneumonia IMPRESSION: Hypoventilation with bibasilar atelectasis and small effusions. Electronically Signed   By: Franchot Gallo M.D.   On: 01/09/2019 14:01   Dg Knee Complete 4 Views Left  Result Date: 01/08/2019 CLINICAL DATA:  Initial evaluation for acute trauma, fall. EXAM: LEFT KNEE - COMPLETE 4+ VIEW COMPARISON:  Prior radiograph from 07/19/2016. FINDINGS: There is a subtle focal linear lucency extending through the lateral aspect of the tibial plateau, favored to be degenerative in nature, although a possible subtle acute nondisplaced fracture not entirely excluded. No appreciable joint effusion. No other acute fracture dislocation. Moderate tricompartmental degenerative osteoarthrosis, advanced from previous. Bones are  mildly osteopenic. No appreciable soft tissue injury. IMPRESSION: 1. Subtle linear lucency extending through the lateral aspect of the tibial plateau, age indeterminate. While this finding is favored to be chronic and degenerative in nature, a subtle acute nondisplaced fracture would be difficult to exclude. Correlation with physical exam recommended. Finding could be further assessed with dedicated cross-sectional imaging as indicated. 2. No other acute osseous abnormality about the knee. 3. Moderate tricompartmental degenerative osteoarthrosis, advanced from previous. Electronically Signed   By: Jeannine Boga M.D.   On: 01/08/2019 13:49   Dg Knee Complete 4 Views Right  Result Date: 01/08/2019 CLINICAL DATA:  Initial evaluation for acute trauma, fall. EXAM: RIGHT KNEE - COMPLETE 4+ VIEW COMPARISON:  None. FINDINGS: No acute fracture dislocation. No joint effusion. Moderate tricompartmental degenerative osteoarthrosis. Mild osteopenia. No acute soft tissue abnormality. IMPRESSION: 1. No acute osseous abnormality about the right knee. 2. Moderate tricompartmental degenerative osteoarthrosis. Electronically Signed   By: Jeannine Boga M.D.   On: 01/08/2019 14:14   Dg Foot Complete Right  Result Date: 01/08/2019 CLINICAL DATA:  Initial evaluation for acute trauma, fall. EXAM: RIGHT FOOT COMPLETE - 3+ VIEW COMPARISON:  Prior radiograph from 01/26/2016. FINDINGS: Tiny osseous fragment at the distal aspect of the right medial malleolus, suspicious for small avulsion type fracture, better seen on concomitant radiograph of the right ankle. No other acute fracture dislocation about the foot. Mild scattered osteoarthritic changes noted. Plantar calcaneal enthesophyte. Mild diffuse osteopenia. Soft tissue swelling again noted about the ankle. IMPRESSION: 1. Tiny osseous fragment at the distal aspect of the right medial malleolus, suspicious for small avulsion type fracture, better seen  on concomitant  radiograph of the right ankle. 2. No other acute osseous abnormality about the foot. Electronically Signed   By: Jeannine Boga M.D.   On: 01/08/2019 13:56   Dg Hip Unilat W Or Wo Pelvis 2-3 Views Right  Result Date: 01/08/2019 CLINICAL DATA:  Right hip pain since a fall down stairs today. Initial encounter. EXAM: DG HIP (WITH OR WITHOUT PELVIS) 2-3V RIGHT COMPARISON:  None. FINDINGS: There is no evidence of hip fracture or dislocation. There is no evidence of arthropathy or other focal bone abnormality. IMPRESSION: Negative exam. Electronically Signed   By: Inge Rise M.D.   On: 01/08/2019 16:06    Assessment/Plan  1. Nausea and vomiting, intractability of vomiting not specified, unspecified vomiting type -No vomiting today, will continue Zofran PRN, will start on regular NAS CCD diet  2. Leukocytosis, unspecified type -wbc 16.4, no fever nor cough, will re-check CBC and get UA w/ culture and sensitivity   Family/ staff Communication:   Discussed plan of care with resident.  Labs/tests ordered: CBC, urinalysis with culture and sensitivity  Goals of care:   Short-term care   Durenda Age, DNP, FNP-BC [facility] [phone] (Monday-Friday 8:00 a.m. - 5:00 p.m.) [phone] (after hours)

## 2019-02-01 ENCOUNTER — Other Ambulatory Visit: Payer: Self-pay | Admitting: Adult Health

## 2019-02-01 ENCOUNTER — Non-Acute Institutional Stay (SKILLED_NURSING_FACILITY): Payer: Medicare Other | Admitting: Adult Health

## 2019-02-01 ENCOUNTER — Encounter: Payer: Self-pay | Admitting: Adult Health

## 2019-02-01 DIAGNOSIS — M109 Gout, unspecified: Secondary | ICD-10-CM | POA: Diagnosis not present

## 2019-02-01 DIAGNOSIS — I5032 Chronic diastolic (congestive) heart failure: Secondary | ICD-10-CM

## 2019-02-01 DIAGNOSIS — F419 Anxiety disorder, unspecified: Secondary | ICD-10-CM | POA: Diagnosis not present

## 2019-02-01 MED ORDER — LORAZEPAM 0.5 MG PO TABS: 0.5000 mg | ORAL_TABLET | Freq: Every day | ORAL | 0 refills | Status: DC | PRN

## 2019-02-01 NOTE — Progress Notes (Signed)
Location:  [facility] Room Number: 119A Place of Service:  SNF (31) Provider:  Durenda Age, DNP, FNP-BC  Patient Care Team: [name], MD as PCP - General (Family Medicine) Nahser, Wonda Cheng, MD as PCP - Cardiology (Cardiology)  Extended Emergency Contact Information Primary Emergency Contact: [place], [place] Phone: [phone] Mobile Phone: [phone] Relation: Sister  Code Status:  Full Code  Goals of care: Advanced Directive information Advanced Directives 01/29/2019  Does Patient Have a Medical Advance Directive? Yes  Type of Advance Directive (No Data)  Does patient want to make changes to medical advance directive? -  Would patient like information on creating a medical advance directive? -     Chief Complaint  Patient presents with   Acute Visit    Elevated uric acid, anxiety    HPI:  Pt is a 73 y.o. female seen today for an elevated uric acid - 12.0. She said that her right great toe hurts at times but relieved by Voltaren gel and Tylenol arthritis. Right great toe is not edematous nor erythematous. She wears a CAM boot on her right foot. She is currently taking Torsemide 50 mg daily for lower extremity edema. She does not have any edema at present. BPs 124/82, 100/59, 132/76, 102/60. She mentioned this week was the one year death anniversary of her husband. She said that she becomes teary and anxious during this time. She has taken a medication before for her anxiety but does not remember what is the name of it.   Past Medical History:  Diagnosis Date   Allergy    bee stings   Anxiety    Asthmatic bronchitis    Broken arm 1957/2008   right   Cataract    had surgery    Depression    Diabetes mellitus without complication ([place])    prediabetes diet controlled   Diverticulosis    GERD (gastroesophageal reflux disease)    Glaucoma    BIL   Hemorrhoids    Hx of adenomatous colonic polyps 12/02/2010    Hyperlipidemia    Hypertension    Hypothyroidism 05/2018   Inflammatory arthritis    Knee bursitis    Migraines    Obesity    Osteoarthritis    Sleep apnea    Past Surgical History:  Procedure Laterality Date   CARPAL TUNNEL RELEASE Left    left wrist   CATARACT EXTRACTION W/ INTRAOCULAR LENS  IMPLANT, BILATERAL Bilateral    1999 left, 2008 right   COLONOSCOPY     EXCISION MORTON'S NEUROMA Left 1978   left foot   GLAUCOMA SURGERY Bilateral    and laser surgery, 2004 left, 2008 right   PARTIAL HYSTERECTOMY  1991   Left an ovary   POLYPECTOMY     TOENAIL AVULSION Right    big toe    Allergies  Allergen Reactions   Aspirin Anaphylaxis   Bee Pollen     Extreme swelling due to bee stings   Codeine Nausea And Vomiting   Fish Oil Diarrhea   Sulfa Antibiotics Nausea And Vomiting   Camphor Dermatitis   Camphor Dermatitis   Lisinopril Cough   Penicillins Rash    DID THE REACTION INVOLVE: Swelling of the face/tongue/throat, SOB, or low BP? Yes Sudden or severe rash/hives, skin peeling, or the inside of the mouth or nose? Unknown Did it require medical treatment? Unknown When did it last happen? If all above answers are NO, may proceed with cephalosporin use.  Sulfasalazine Nausea And Vomiting   Vicodin [Hydrocodone-Acetaminophen] Nausea And Vomiting    Outpatient Encounter Medications as of 02/01/2019  Medication Sig   Acetaminophen (TYLENOL ARTHRITIS PAIN PO) Take 650 mg by mouth every 6 (six) hours as needed (pain).    levothyroxine (SYNTHROID, LEVOTHROID) 50 MCG tablet Take 50 mcg by mouth daily before breakfast.    LORazepam (ATIVAN) 0.5 MG tablet Take 1 tablet (0.5 mg total) by mouth daily as needed for up to 14 days for anxiety.   metFORMIN (GLUCOPHAGE) 500 MG tablet Take 500 mg by mouth 2 (two) times daily with a meal.    metoprolol tartrate (LOPRESSOR) 25 MG tablet Take 1 tablet (25 mg total) by mouth 2 (two) times daily.     Multiple Vitamin (MULTIVITAMIN WITH MINERALS) TABS tablet Take 1 tablet by mouth daily.    Nutritional Supplement LIQD Take 90 mLs by mouth daily. Med Pass   ondansetron (ZOFRAN) 4 MG tablet GIVE 1 TABLET BY MOUTH EVERY 6 HOURS AS NEEDED FOR NAUSEA/VOMITING   ONETOUCH VERIO test strip USE TO TEST BLOOD GLUCOSE ONCE DAILY   pantoprazole (PROTONIX) 40 MG tablet TAKE 1 TABLET BY MOUTH  DAILY BEFORE BREAKFAST   polyvinyl alcohol (LIQUIFILM TEARS) 1.4 % ophthalmic solution Place 1 drop into both eyes every 4 (four) hours as needed for dry eyes.   potassium chloride SA (K-DUR,KLOR-CON) 20 MEQ tablet Take 40 mEq by mouth daily. Take 2 tablets to = 40 mEq   torsemide (DEMADEX) 100 MG tablet Take 0.5 tablets (50 mg total) by mouth daily.   No facility-administered encounter medications on file as of 02/01/2019.     Review of Systems  GENERAL: No change in appetite, no fatigue, no weight changes, no fever, chills or weakness MOUTH and THROAT: Denies oral discomfort, gingival pain or bleeding RESPIRATORY: no cough, SOB, DOE, wheezing, hemoptysis CARDIAC: No chest pain, edema or palpitations GI: No abdominal pain, diarrhea, constipation, heart burn, nausea or vomiting GU: Denies dysuria, frequency, hematuria, incontinence, or discharge NEUROLOGICAL: Denies dizziness, syncope, numbness, or headache PSYCHIATRIC: + anxiety    Immunization History  Administered Date(s) Administered   Pneumococcal Conjugate-13 02/12/2014   Tdap 12/31/2008   Pertinent  Health Maintenance Due  Topic Date Due   MAMMOGRAM  02/14/2019 (Originally 10/22/1995)   OPHTHALMOLOGY EXAM  02/14/2019 (Originally 10/22/1955)   URINE MICROALBUMIN  02/14/2019 (Originally 10/22/1955)   DEXA SCAN  02/14/2019 (Originally 10/21/2010)   PNA vac Low Risk Adult (2 of 2 - PPSV23) 02/14/2019 (Originally 02/13/2015)   HEMOGLOBIN A1C  03/10/2019   INFLUENZA VACCINE  03/30/2019   FOOT EXAM  06/20/2019   COLONOSCOPY   06/01/2023   Fall Risk  10/30/2018 07/11/2016 01/07/2016  Falls in the past year? 0 No No     Vitals:   02/01/19 2348  BP: 124/82  Pulse: 76  Resp: 17  Temp: (!) 97.3 F (36.3 C)  Weight: 212 lb (96.2 kg)  Height: 5\' 2"  (1.575 m)   Body mass index is 38.78 kg/m.  Physical Exam  GENERAL APPEARANCE: Well nourished. In no acute distress. Obese SKIN:  Skin is warm and dry.  MOUTH and THROAT: Lips are without lesions. Oral mucosa is moist and without lesions. Tongue is normal in shape, size, and color and without lesions RESPIRATORY: Breathing is even & unlabored, BS CTAB CARDIAC: RRR, no murmur,no extra heart sounds, no edema GI: Abdomen soft, normal BS, no masses, no tenderness EXTREMITIES:  Right foot has CAM boot NEUROLOGICAL: There is no tremor. Speech  is clear. Alert and oriented X 3. PSYCHIATRIC:  Affect and behavior are appropriate  Labs reviewed: Recent Labs    09/13/18 0447 09/14/18 0403 09/15/18 0502 09/15/18 0834 09/16/18 0359 09/17/18 0418 11/05/18 1028 11/28/18 0944  NA 141 139 138  --  140 140 145* 144  K 3.3* 3.0* 3.2*  --  3.5 3.7 4.5 4.0  CL 101 96* 94*  --  96* 94* 102 99  CO2 29 31 33*  --  34* 35* 23 25  GLUCOSE 179* 180* 182*  --  177* 204* 113* 119*  BUN 51* 52* 57*  --  55* 57* 29* 25  CREATININE 1.22* 1.15* 1.40*  --  1.19* 1.22* 1.29* 1.17*  CALCIUM 8.3* 8.3* 8.0*  --  8.3* 8.4* 8.1* 7.7*  MG 1.8 1.9  --  1.9  --   --   --   --   PHOS  --   --  4.1  --  3.5 3.6  --   --    Recent Labs    09/04/18 1052 09/08/18 1609 09/09/18 0824 09/15/18 0502 09/16/18 0359 09/17/18 0418  AST 13* 15 15  --   --   --   ALT 8 10 8   --   --   --   ALKPHOS 83 77 63  --   --   --   BILITOT 0.3 0.8 0.2*  --   --   --   PROT 6.4* 6.4* 5.7*  --   --   --   ALBUMIN 2.9* 3.2* 2.7* 3.0* 2.9* 3.1*   Recent Labs    09/08/18 1720  09/13/18 0447 09/14/18 0403 11/28/18 0944  WBC 8.7   < > 7.4 7.1 9.4  NEUTROABS 6.5  --  4.9 4.8  --   HGB 9.1*   < > 9.1*  8.9* 11.4  HCT 30.9*   < > 30.4* 29.4* 35.5  MCV 93.9   < > 91.0 91.0 84  PLT 261   < > 285 258 401   < > = values in this interval not displayed.   Lab Results  Component Value Date   TSH 3.265 09/09/2018   Lab Results  Component Value Date   HGBA1C 7.1 (H) 09/09/2018   No results found for: CHOL, HDL, LDLCALC, LDLDIRECT, TRIG, CHOLHDL  Significant Diagnostic Results in last 30 days:  Dg Tibia/fibula Right  Result Date: 01/08/2019 CLINICAL DATA:  Initial evaluation for acute trauma, fall. EXAM: RIGHT TIBIA AND FIBULA - 2 VIEW COMPARISON:  None. FINDINGS: No acute fracture dislocation. Osteoarthritic changes noted about the knee and ankle. Mild osteopenia. No acute soft tissue abnormality. IMPRESSION: No acute osseous abnormality about the right tibia/fibula. Electronically Signed   By: Jeannine Boga M.D.   On: 01/08/2019 14:11   Dg Ankle Complete Left  Result Date: 01/08/2019 CLINICAL DATA:  Initial evaluation for acute trauma, fall. EXAM: LEFT ANKLE COMPLETE - 3+ VIEW COMPARISON:  None. FINDINGS: No acute fracture dislocation. Ankle mortise approximated. Talar dome intact. Plantar calcaneal enthesophyte noted. Bones are mildly osteopenic. No acute soft tissue abnormality. Few scattered vascular calcifications noted. IMPRESSION: No acute osseous abnormality about the left ankle. Electronically Signed   By: Jeannine Boga M.D.   On: 01/08/2019 13:50   Dg Ankle Complete Right  Result Date: 01/08/2019 CLINICAL DATA:  Initial evaluation for acute trauma, fall. EXAM: RIGHT ANKLE - COMPLETE 3+ VIEW COMPARISON:  None. FINDINGS: Tiny osseous fragment at the distal aspect of the medial malleolus,  suspicious for an acute minimally displaced avulsion type fracture. Overlying soft tissue swelling. No other acute fracture or dislocation. Ankle mortise approximated. Talar dome intact. Plantar calcaneal enthesophyte noted. Mild diffuse osteopenia noted. IMPRESSION: 1. Tiny osseous fragment  at the distal aspect of the medial malleolus, suspicious for an acute minimally displaced avulsion type fracture. 2. Associated soft tissue swelling about the ankle. Electronically Signed   By: Jeannine Boga M.D.   On: 01/08/2019 13:53   Ct Knee Left Wo Contrast  Result Date: 01/08/2019 CLINICAL DATA:  Left knee pain due to an injury suffered in a fall down stairs today. Initial encounter. EXAM: CT OF THE LEFT KNEE WITHOUT CONTRAST TECHNIQUE: Multidetector CT imaging of the left knee was performed according to the standard protocol. Multiplanar CT image reconstructions were also generated. COMPARISON:  Plain films left knee 07/19/2016 and 01/08/2019. FINDINGS: Bones/Joint/Cartilage There is no fracture or dislocation. Small osteophytes are present about the knee and account for finding on plain films earlier today. Small subchondral cysts are also seen in the femoral trochlea and superior pole of the patella. Peaking of the tibial spines is noted. Ligaments Suboptimally assessed by CT. The cruciate and collateral ligaments appear intact. Muscles and Tendons Intact. Soft tissues Mild subcutaneous edema is present about the knee. No joint effusion. No Baker's cyst. IMPRESSION: Negative for fracture.  No acute abnormality. Mild to moderate osteoarthritis about the knee. Electronically Signed   By: Inge Rise M.D.   On: 01/08/2019 15:57   Dg Chest Portable 1 View  Result Date: 01/09/2019 CLINICAL DATA:  Choking EXAM: PORTABLE CHEST 1 VIEW COMPARISON:  09/08/2018 FINDINGS: Decreased lung volume with mild bibasilar atelectasis. Negative for heart failure. Probable small pleural effusions. No definite pneumonia IMPRESSION: Hypoventilation with bibasilar atelectasis and small effusions. Electronically Signed   By: Franchot Gallo M.D.   On: 01/09/2019 14:01   Dg Knee Complete 4 Views Left  Result Date: 01/08/2019 CLINICAL DATA:  Initial evaluation for acute trauma, fall. EXAM: LEFT KNEE - COMPLETE 4+  VIEW COMPARISON:  Prior radiograph from 07/19/2016. FINDINGS: There is a subtle focal linear lucency extending through the lateral aspect of the tibial plateau, favored to be degenerative in nature, although a possible subtle acute nondisplaced fracture not entirely excluded. No appreciable joint effusion. No other acute fracture dislocation. Moderate tricompartmental degenerative osteoarthrosis, advanced from previous. Bones are mildly osteopenic. No appreciable soft tissue injury. IMPRESSION: 1. Subtle linear lucency extending through the lateral aspect of the tibial plateau, age indeterminate. While this finding is favored to be chronic and degenerative in nature, a subtle acute nondisplaced fracture would be difficult to exclude. Correlation with physical exam recommended. Finding could be further assessed with dedicated cross-sectional imaging as indicated. 2. No other acute osseous abnormality about the knee. 3. Moderate tricompartmental degenerative osteoarthrosis, advanced from previous. Electronically Signed   By: Jeannine Boga M.D.   On: 01/08/2019 13:49   Dg Knee Complete 4 Views Right  Result Date: 01/08/2019 CLINICAL DATA:  Initial evaluation for acute trauma, fall. EXAM: RIGHT KNEE - COMPLETE 4+ VIEW COMPARISON:  None. FINDINGS: No acute fracture dislocation. No joint effusion. Moderate tricompartmental degenerative osteoarthrosis. Mild osteopenia. No acute soft tissue abnormality. IMPRESSION: 1. No acute osseous abnormality about the right knee. 2. Moderate tricompartmental degenerative osteoarthrosis. Electronically Signed   By: Jeannine Boga M.D.   On: 01/08/2019 14:14   Dg Foot Complete Right  Result Date: 01/08/2019 CLINICAL DATA:  Initial evaluation for acute trauma, fall. EXAM: RIGHT FOOT COMPLETE -  3+ VIEW COMPARISON:  Prior radiograph from 01/26/2016. FINDINGS: Tiny osseous fragment at the distal aspect of the right medial malleolus, suspicious for small avulsion type  fracture, better seen on concomitant radiograph of the right ankle. No other acute fracture dislocation about the foot. Mild scattered osteoarthritic changes noted. Plantar calcaneal enthesophyte. Mild diffuse osteopenia. Soft tissue swelling again noted about the ankle. IMPRESSION: 1. Tiny osseous fragment at the distal aspect of the right medial malleolus, suspicious for small avulsion type fracture, better seen on concomitant radiograph of the right ankle. 2. No other acute osseous abnormality about the foot. Electronically Signed   By: Jeannine Boga M.D.   On: 01/08/2019 13:56   Dg Hip Unilat W Or Wo Pelvis 2-3 Views Right  Result Date: 01/08/2019 CLINICAL DATA:  Right hip pain since a fall down stairs today. Initial encounter. EXAM: DG HIP (WITH OR WITHOUT PELVIS) 2-3V RIGHT COMPARISON:  None. FINDINGS: There is no evidence of hip fracture or dislocation. There is no evidence of arthropathy or other focal bone abnormality. IMPRESSION: Negative exam. Electronically Signed   By: Inge Rise M.D.   On: 01/08/2019 16:06    Assessment/Plan  1. Acute gout involving toe of right foot, unspecified cause - uric acid 12.0, elevated, will start Allopurinol 100 mg 1 tab daily  2. Chronic diastolic congestive heart failure (HCC) - no edema nor SOB, will decrease Torsemide from 50 mg to 20 mg daily and decrease KCL ER from 40 meq to 20 meq daily  3. Anxiety - triggered by death anniversary of her husband, will start Ativan 0.5 mg 1 tab daily PRN X 14 days, monitor behavior    Family/ staff Communication: Discussed plan of care with resident.  Labs/tests ordered:  BMP in 1 week, uric acid in 1 month  Goals of care:   Short-term care   Durenda Age, DNP, FNP-BC [facility] [phone] (Monday-Friday 8:00 a.m. - 5:00 p.m.) [phone] (after hours)

## 2019-02-04 ENCOUNTER — Telehealth: Payer: Self-pay

## 2019-02-04 ENCOUNTER — Encounter: Payer: Self-pay | Admitting: Internal Medicine

## 2019-02-04 ENCOUNTER — Non-Acute Institutional Stay (SKILLED_NURSING_FACILITY): Payer: Medicare Other | Admitting: Internal Medicine

## 2019-02-04 DIAGNOSIS — R110 Nausea: Secondary | ICD-10-CM | POA: Diagnosis not present

## 2019-02-04 DIAGNOSIS — I959 Hypotension, unspecified: Secondary | ICD-10-CM | POA: Diagnosis not present

## 2019-02-04 DIAGNOSIS — M109 Gout, unspecified: Secondary | ICD-10-CM

## 2019-02-04 DIAGNOSIS — E790 Hyperuricemia without signs of inflammatory arthritis and tophaceous disease: Secondary | ICD-10-CM | POA: Insufficient documentation

## 2019-02-04 NOTE — Progress Notes (Signed)
This is an acute visit.  Level of care skilled.  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  Chief complaint-acute visit secondary to hypotension- also nausea.  History of present illness--- patient is a pleasant 73 year old female  She is here for rehab after falling at home with a minor right ankle fracture continues in a cam walker.  She also has a history of hypothyroidism in addition to hypertension GERD dyslipidemia sleep apnea osteoporosi as well as depression and diverticulosis.   She also has a history of an elevated creatinine kinase of 977 and a C-reactive protein of 1.71 and a sed rate of 91.  She actually saw  rheumatology today for this-but apparently exam was somewhat limited because she was not feeling well and had low blood pressure.  Currently she is resting in bed comfortably says she is feeling somewhat better but does have some nausea.  Says she still does feel weak  She does not really complain of being dizzy or syncopal.  Blood pressure in facility is 90/60.  Of note she is on Metroprolol and there was recommendation by rheumatology to discontinue this.  She also was recently started on allopurinol with a history of gout more so of her right great toe- she says this is significantly better uric acid was elevated at 12  .  Past Medical History:  Diagnosis Date  . Allergy    bee stings  . Anxiety   . Asthmatic bronchitis   . Broken arm 1957/2008   right  . Cataract    had surgery   . Depression   . Diabetes mellitus without complication ([place])    prediabetes diet controlled  . Diverticulosis   . GERD (gastroesophageal reflux disease)   . Glaucoma    BIL  . Hemorrhoids   . Hx of adenomatous colonic polyps 12/02/2010  . Hyperlipidemia   . Hypertension   . Hypothyroidism 05/2018  . Inflammatory arthritis   . Knee bursitis   . Migraines   . Obesity   . Osteoarthritis   . Sleep apnea         Past Surgical History:  Procedure  Laterality Date  . CARPAL TUNNEL RELEASE Left    left wrist  . CATARACT EXTRACTION W/ INTRAOCULAR LENS  IMPLANT, BILATERAL Bilateral    1999 left, 2008 right  . COLONOSCOPY    . EXCISION MORTON'S NEUROMA Left 1978   left foot  . GLAUCOMA SURGERY Bilateral    and laser surgery, 2004 left, 2008 right  . PARTIAL HYSTERECTOMY  1991   Left an ovary  . POLYPECTOMY    . TOENAIL AVULSION Right    big toe         Allergies  Allergen Reactions  . Aspirin Anaphylaxis  . Bee Pollen     Extreme swelling due to bee stings  . Codeine Nausea And Vomiting  . Fish Oil Diarrhea  . Sulfa Antibiotics Nausea And Vomiting  . Camphor Dermatitis  . Camphor Dermatitis  . Lisinopril Cough  . Penicillins Rash    DID THE REACTION INVOLVE: Swelling of the face/tongue/throat, SOB, or low BP? Yes Sudden or severe rash/hives, skin peeling, or the inside of the mouth or nose? Unknown Did it require medical treatment? Unknown When did it last happen? If all above answers are "NO", may proceed with cephalosporin use.   . Sulfasalazine Nausea And Vomiting  . Vicodin [Hydrocodone-Acetaminophen] Nausea And Vomiting        MEDICATIONS   Medication Sig  . Acetaminophen (TYLENOL ARTHRITIS  PAIN PO) Take 650 mg by mouth every 6 (six) hours as needed (pain).   [name] levothyroxine (SYNTHROID, LEVOTHROID) 50 MCG tablet Take 50 mcg by mouth daily before breakfast.   . LORazepam (ATIVAN) 0.5 MG tablet Take 1 tablet (0.5 mg total) by mouth daily as needed for up to 14 days for anxiety.  . metFORMIN (GLUCOPHAGE) 500 MG tablet Take 500 mg by mouth 2 (two) times daily with a meal.   . metoprolol tartrate (LOPRESSOR) 25 MG tablet Take 1 tablet (25 mg total) by mouth 2 (two) times daily.  . Multiple Vitamin (MULTIVITAMIN WITH MINERALS) TABS tablet Take 1 tablet by mouth daily.   . Nutritional Supplement LIQD Take 90 mLs by mouth daily. Med Pass  . ondansetron (ZOFRAN) 4 MG tablet GIVE 1 TABLET  BY MOUTH EVERY 6 HOURS AS NEEDED FOR NAUSEA/VOMITING  . ONETOUCH VERIO test strip USE TO TEST BLOOD GLUCOSE ONCE DAILY  . pantoprazole (PROTONIX) 40 MG tablet TAKE 1 TABLET BY MOUTH  DAILY BEFORE BREAKFAST  . polyvinyl alcohol (LIQUIFILM TEARS) 1.4 % ophthalmic solution Place 1 drop into both eyes every 4 (four) hours as needed for dry eyes.  . potassium chloride SA (K-DUR,KLOR-CON) 20 MEQ tablet Take 20 mEq by mouth daily. Take 2 tablets to = 40 mEq  . torsemide (DEMADEX) 20 MG tablet Take 20 mgby mouth daily.   Also on allopurinol 100 mg p.o. daily  Review of systems.  In general is not complaining of fever chills says she does feel weak   Skin does not complain of rashes or itching.  Head ears eyes nose mouth and throat does not complain of visual changes or sore throat.  Respiratory does not complain of shortness of breath or cough.  Cardiac denies chest pain or increased edema from baseline.  GI does complain of some nausea does not complain of constipation or diarrhea.  GU is not complaining of any dysuria.  Musculoskeletal does have weakness does not complain currently of joint pain had complained of some pain previously but says the Voltaren and allopurinol have helped.  Neurologic does not complain of dizziness headache or syncope.  And psych does not complain of overt pression-has had some anxiety triggered by the death of her husband a year ago- she does have PRN Ativan a short course   Physical exam.  She is afebrile pulse of 80 respirations of 18 blood pressure 90/60.  In general this is a pleasant elderly female in no distress lying comfortably in bed.  Her skin is warm and dry she is somewhat pale.  Eyes visual acuity appears to be intact sclera and conjunctive are clear.  Oropharynx is clear mucous membranes moist.  Chest is clear to auscultation there is no labored breathing.  Heart is regular rate and rhythm without murmur gallop or rub she has minimal  trace lower extremity edema.  Abdomen is soft somewhat obese nontender with positive bowel sounds.  Musculoskeletal continues with cam walker right lower extremity moves her other extremities appears at baseline although limited exam since she is in bed.  Neurologic is grossly intact her speech is clear could not appreciate any lateralizing findings.  Psych she is alert and oriented pleasant and appropriate.  Labs.  February 01, 2019.  Uric acid 12.0.  June second 2020.  Sodium 135 potassium 5.1 BUN 37.5 creatinine 1.07.  Albumin 3.3-AST 39-otherwise liver function test within normal limits.  WBC 16.4 hemoglobin 10.4 platelets 445.  Assessment and plan.  1.  History  of hypotensive readings- will DC metoprolol and monitor she does not appear overtly symptomatic but this may be contributing to her weakness.  2.  Nausea she does have PRN Zofran at one point when I saw her previously she did have some nausea and vomiting which resolved by the next day.  She says she  does have an appetite and would like some soup and see how that goes-.  Also will update labs tomorrow including a CBC and metabolic panel.  3.  Recent gout with uric acid of 12.0 this appears to be under control she says the Voltaren helps as well as the allopurinol.  4.  History of elevated creatinine kinase CRP and sed rate- she will need to be seen by rheumatology again for follow-up  #5 history of lower extremity edema this has improved in fact her Demadex was recently reduced down to 20 mg a day potassium dose also was reduced again update labs for tomorrow  [phone]

## 2019-02-04 NOTE — Telephone Encounter (Signed)
Pt is having a lot of pain problems with severe pain. Pt is not ready to reschedule sleep study appointment at this time. Pt was asked to call back when ready to reschedule.

## 2019-02-05 ENCOUNTER — Encounter: Payer: Self-pay | Admitting: Internal Medicine

## 2019-02-06 ENCOUNTER — Non-Acute Institutional Stay (SKILLED_NURSING_FACILITY): Payer: Medicare Other | Admitting: Internal Medicine

## 2019-02-06 ENCOUNTER — Encounter: Payer: Self-pay | Admitting: Internal Medicine

## 2019-02-06 ENCOUNTER — Telehealth: Payer: Self-pay | Admitting: Neurology

## 2019-02-06 DIAGNOSIS — N183 Chronic kidney disease, stage 3 unspecified: Secondary | ICD-10-CM

## 2019-02-06 DIAGNOSIS — M109 Gout, unspecified: Secondary | ICD-10-CM

## 2019-02-06 DIAGNOSIS — I5032 Chronic diastolic (congestive) heart failure: Secondary | ICD-10-CM | POA: Diagnosis not present

## 2019-02-06 DIAGNOSIS — E875 Hyperkalemia: Secondary | ICD-10-CM | POA: Diagnosis not present

## 2019-02-06 DIAGNOSIS — I959 Hypotension, unspecified: Secondary | ICD-10-CM | POA: Diagnosis not present

## 2019-02-06 NOTE — Progress Notes (Signed)
This is an acute visit.  Level of care skilled.  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  Chief complaint-acute visit to follow-up hypotension- nausea.  History of present illness.  Patient is a pleasant 73 year old female seen today for follow-up of hypotension as well as nausea and weakness.  She is here for rehab after falling at home with a minor right ankle fracture and she does have a cam walker.  Also has a history of hypothyroidism in addition to hypertension GERD dyslipidemia sleep apnea osteoporosis depression and diverticulosis.--She also has a history of diastolic CHF noted to be grade 1- she is followed by cardiology  She has a history of an elevated creatinine kinase of 977 and C-reactive protein of 1.71 and a sed rate of 91.  She actually saw rheumatology earlier this week but appointment apparently was cut short because her blood pressure was running somewhat low and she was weak.  Today she says she is feeling better we have discontinued her Lopressor.  She also complained of some nausea she does receive Zofran and she says that is better.  We did do updated labs yesterday which shows decrease in her white count down to 10.3 and previously had been around 16,000.  Hemoglobin is stable at 10.6.  It is noted her creatinine has gone up somewhat to 1.68 had been in the low ones on earlier lab.  BUN is in the mid 50s.  Potassium also was mildly elevated at 5.6.  Currently she says she is feeling better vital signs appear to be stable.  I do note she is on Demadex and potassium with history of CHF her Demadex and potassium were recently reduced because her edema appears to be under good control.   Her blood pressure taken manually today was 132/62 see previous reading 118/68 there was 90/60 when I saw her Monday.  She does not complain of dizziness headache numbness or weakness beyond baseline today.  She is being treated for gout she is on allopurinol she also says she  receives relief with Voltaren gel --since her right great toe is hurting somewhat again and she would like the Voltaren gel restarted apparently this has been stopped  Past Medical History:  Diagnosis Date  . Allergy    bee stings  . Anxiety   . Asthmatic bronchitis   . Broken arm 1957/2008   right  . Cataract    had surgery   . Depression   . Diabetes mellitus without complication ([place])    prediabetes diet controlled  . Diverticulosis   . GERD (gastroesophageal reflux disease)   . Glaucoma    BIL  . Hemorrhoids   . Hx of adenomatous colonic polyps 12/02/2010  . Hyperlipidemia   . Hypertension   . Hypothyroidism 05/2018  . Inflammatory arthritis   . Knee bursitis   . Migraines   . Obesity   . Osteoarthritis   . Sleep apnea         Past Surgical History:  Procedure Laterality Date  . CARPAL TUNNEL RELEASE Left    left wrist  . CATARACT EXTRACTION W/ INTRAOCULAR LENS IMPLANT, BILATERAL Bilateral    1999 left, 2008 right  . COLONOSCOPY    . EXCISION MORTON'S NEUROMA Left 1978   left foot  . GLAUCOMA SURGERY Bilateral    and laser surgery, 2004 left, 2008 right  . PARTIAL HYSTERECTOMY  1991   Left an ovary  . POLYPECTOMY    . TOENAIL AVULSION Right    big toe  Allergies  Allergen Reactions  . Aspirin Anaphylaxis  . Bee Pollen     Extreme swelling due to bee stings  . Codeine Nausea And Vomiting  . Fish Oil Diarrhea  . Sulfa Antibiotics Nausea And Vomiting  . Camphor Dermatitis  . Camphor Dermatitis  . Lisinopril Cough  . Penicillins Rash    DID THE REACTION INVOLVE: Swelling of the face/tongue/throat, SOB, or low BP? Yes Sudden or severe rash/hives, skin peeling, or the inside of the mouth or nose? Unknown Did it require medical treatment? Unknown When did it last happen? If all above answers are "NO", may proceed with cephalosporin use.   . Sulfasalazine Nausea And Vomiting  . Vicodin  [Hydrocodone-Acetaminophen] Nausea And Vomiting        MEDICATIONS   Medication Sig  . Acetaminophen (TYLENOL ARTHRITIS PAIN PO) Take 650 mg by mouth every 6 (six) hours as needed (pain).   [name] levothyroxine (SYNTHROID, LEVOTHROID) 50 MCG tablet Take 50 mcg by mouth daily before breakfast.   . LORazepam (ATIVAN) 0.5 MG tablet Take 1 tablet (0.5 mg total) by mouth daily as needed for up to 14 days for anxiety.  . metFORMIN (GLUCOPHAGE) 500 MG tablet Take 500 mg by mouth 2 (two) times daily with a meal.   . metoprolol tartrate (LOPRESSOR) 25 MG tablet Take 1 tablet (25 mg total) by mouth 2 (two) times daily.  . Multiple Vitamin (MULTIVITAMIN WITH MINERALS) TABS tablet Take 1 tablet by mouth daily.   . Nutritional Supplement LIQD Take 90 mLs by mouth daily. Med Pass  . ondansetron (ZOFRAN) 4 MG tablet GIVE 1 TABLET BY MOUTH EVERY 6 HOURS AS NEEDED FOR NAUSEA/VOMITING  . ONETOUCH VERIO test strip USE TO TEST BLOOD GLUCOSE ONCE DAILY  . pantoprazole (PROTONIX) 40 MG tablet TAKE 1 TABLET BY MOUTH DAILY BEFORE BREAKFAST  . polyvinyl alcohol (LIQUIFILM TEARS) 1.4 % ophthalmic solution Place 1 drop into both eyes every 4 (four) hours as needed for dry eyes.  . potassium chloride SA (K-DUR,KLOR-CON) 20 MEQ tablet Take 20 mEq by mouth daily. Take 2 tablets to = 40 mEq  . torsemide (DEMADEX) 20 MG tablet Take 20 mgby mouth daily.   Also on allopurinol 100 mg p.o. daily  Review of systems.  In general she is not complaining of any fever chills says she feels better today than on Monday.  Skin does not complain of rashes itching or diaphoresis.  Head ears eyes nose mouth and throat does not complain of visual changes or sore throat.   Respiratory does not complain of shortness of breath or cough.  Cardiac does not complain of chest pain her edema appears to be well controlled fairly minimal.  GU does not complain of dysuria.  Musculoskeletal does not complain of joint pain other than  some pain in her right great toe again she does have a history of gout.  Neurologic does not complain of dizziness headache or syncope says she feels a bit stronger today than she did on Monday.  Psych does not complain of overt depression or anxiety today  Physical exam.  Temperature is 98.6 pulse of 90 respirations 18 blood pressure 118/68 O2 saturation is 98% on room air  In general this is a pleasant elderly female in no distress lying comfortably in bed she appears to be feeling better than she did earlier this week.  Her skin is warm and dry.  Eyes visual acuity appears to be intact sclera and conjunctive are clear.  Oropharynx is  clear mucous membranes moist.  Chest is clear to auscultation there is no labored breathing.  Heart is regular rate and rhythm without murmur gallop or rub she has very mild lower extremity edema.  Abdomen is somewhat obese soft nontender with positive bowel sounds.  Musculoskeletal has a cam walker right lower extremity appears able to move all extremities x4 again limited exam since she is in bed but appears to be at baseline Right great toe I could not really appreciate significant edema or erythema there is some tenderness to palpation to the base of the toe which she says has increased recently  Neurologic is grossly intact her speech is clear could not really appreciate lateralizing findings.  Psych she is alert and oriented pleasant and appropriate  Labs.  February 05, 2019.  WBC 10.3 hemoglobin 10.6 platelets 426.  Sodium 140 potassium 5.6 BUN 56.3 creatinine 1.68.  Assessment and plan.  1.  Hypotension this appears to have improved off the Lopressor at this point continue to monitor blood pressure and pulse.  She says she is feeling somewhat stronger.  2.  History of chronic kidney disease thought to be stage III-creatinine has crept up to 1.68 with a BUN of 56.3- will hold Demadex for now as well as potassium since potassium is 5.6  will update this tomorrow.  3.  History of elevated creatinine kinase CRP and sed rate- rheumatology appointment earlier this week was cut short because of hypotension concerns- hypotension appears to have improved she will need follow-up by rheumatology- also will update her sed rate creatinine kinase and CRP tomorrow.  4.-History of gout-she has been started low-dose allopurinol does she says she actually gets relief with the Voltaren gel will restart this.  Also will consider starting prednisone pending the results of the labs  #[id number] of diastolic CHF again will cautiously hold her Demadex and potassium secondary to declining renal function-will await update labs tomorrow-monitor weights 2 times a week notify provider of gain greater than 3 pounds- appears her weight is down approximately 10 pounds from what it was in May --the edema appears well controlled and clinically appears stable at this point  #7 also per discussion with Dr. Linna Darner -- secondary to weakness -will order neurology follow-up for her-with Dr. Brett Fairy suspect she will need a nerve conduction study and EMG-  Clinically she appears to be feeling better but will need to be monitored.  [id number]

## 2019-02-06 NOTE — Telephone Encounter (Signed)
Hendricks Limes, MD  Ronica Vivian, Asencion Partridge, MD        Patient is at [facility]; please schedule follow up for neurolyse weakness; rule out neuromuscular etiology. Please Fax appointment information to [facility] [phone] attention Neoma Laming) so facility can arrange transport that day     Dear Hop,  We are not yet seeing patients from nursing homes or group homes live- will be in July 2020.  The patient is established with a neurologist, and  She needs to see neuromuscular sub -specialist based on the symptoms listed.  May I defer to one of my neuromuscular colleagues?  Ou can text me on my cell phone to discuss. Asencion Partridge

## 2019-02-07 ENCOUNTER — Telehealth: Payer: Self-pay | Admitting: Cardiovascular Disease

## 2019-02-07 LAB — BASIC METABOLIC PANEL
BUN: 44 — AB (ref 4–21)
Creatinine: 1.2 — AB (ref 0.5–1.1)
Glucose: 129
Potassium: 4.6 (ref 3.4–5.3)
Sodium: 140 (ref 137–147)

## 2019-02-07 NOTE — Telephone Encounter (Signed)
Spoke with patient who stated on [facility] Provider stopped Lopressor twice a day due to low blood pressure readings. Wed Provider stopped Lasix and Potassium due to lab work stating patient was dry.  Pt was told [place]  [facility] will be contacted for records on these changes and our  Provider will be notified. Patient aware of awaiting a phone cal back for recommendations.  Contacted [facility] her nurse for the day stated she will fax over labs and office note or documentation relative to stopping patient medications to assist with our provider recommendations.

## 2019-02-07 NOTE — Telephone Encounter (Signed)
New Message    Pt c/o BP issue: STAT if pt c/o blurred vision, one-sided weakness or slurred speech  1. What are your last 5 BP readings? Monday 91/60  Today 101/ but the pt doesn't remember the bottom number   2. Are you having any other symptoms (ex. Dizziness, headache, blurred vision, passed out)? No   3. What is your BP issue? Pt is at a rehab center called [facility]. She says her BP has been running low They took her off of Lasix and Potassium    Please call

## 2019-02-08 ENCOUNTER — Non-Acute Institutional Stay (SKILLED_NURSING_FACILITY): Payer: Medicare Other | Admitting: Internal Medicine

## 2019-02-08 DIAGNOSIS — N183 Chronic kidney disease, stage 3 unspecified: Secondary | ICD-10-CM

## 2019-02-08 DIAGNOSIS — Z8739 Personal history of other diseases of the musculoskeletal system and connective tissue: Secondary | ICD-10-CM

## 2019-02-08 DIAGNOSIS — I5032 Chronic diastolic (congestive) heart failure: Secondary | ICD-10-CM | POA: Diagnosis not present

## 2019-02-08 DIAGNOSIS — I959 Hypotension, unspecified: Secondary | ICD-10-CM

## 2019-02-08 LAB — POCT ERYTHROCYTE SEDIMENTATION RATE, NON-AUTOMATED: Sed Rate: 72

## 2019-02-08 NOTE — Progress Notes (Signed)
This is an acute visit.  Level of care skilled.  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  Chief complaint acute visit follow-up renal insufficiency- hypotension- mild tachycardia-gout.  History of present illness.  Patient is a pleasant 73 year old female seen today for the above-stated issues.  She is here for rehab after falling at home with a minor right ankle fracture she has a cam walker.  She also has a history of hypothyroidism as well as hypertension GERD dyslipidemia sleep apnea osteoporosis depression diverticulosis as well as history of diastolic CHF thought to be grade 1 she is followed by cardiology.  She also has a history of an elevated creatinine kinase of 977 as well as C-reactive protein of 1.71 and a sed rate of 91.  Updated labs have actually show some improvement in this with a creatinine kinase of 973 CRP is now 1.22 sed rate is pending.  We held her Demadex and potassium earlier in the week because potassium was 5.6 and creatinine had gone up to 1.68.  Subsequent lab done yesterday shows that the creatinine has improved down to 1.2 with a BUN of 44.4.  [name]  Potassium is now 4.6.  We also have Iatan her Lopressor because of weakness she actually was at rheumatology earlier this week and exam was limited because of her weakness-and she was noted to have a systolic the 16X.  Subsequent blood pressures have risen somewhat I got a manual reading of 120/70 today.  She is somewhat tachycardic at 104- she says she is feeling gradually stronger appears to be in better spirits.  Of note she is also recently dealing with gout she was started on low-dose allopurinol and gets Voltaren gel she actually says the Voltaren gel is helping quite a bit.  We did consider prednisone but per her report does not appear that this is really necessary at this point.  Currently she appears to be getting stronger vital signs appear to be stable   Past Medical History:  Diagnosis Date  . Allergy     bee stings  . Anxiety   . Asthmatic bronchitis   . Broken arm 1957/2008   right  . Cataract    had surgery   . Depression   . Diabetes mellitus without complication ([place])    prediabetes diet controlled  . Diverticulosis   . GERD (gastroesophageal reflux disease)   . Glaucoma    BIL  . Hemorrhoids   . Hx of adenomatous colonic polyps 12/02/2010  . Hyperlipidemia   . Hypertension   . Hypothyroidism 05/2018  . Inflammatory arthritis   . Knee bursitis   . Migraines   . Obesity   . Osteoarthritis   . Sleep apnea         Past Surgical History:  Procedure Laterality Date  . CARPAL TUNNEL RELEASE Left    left wrist  . CATARACT EXTRACTION W/ INTRAOCULAR LENS IMPLANT, BILATERAL Bilateral    1999 left, 2008 right  . COLONOSCOPY    . EXCISION MORTON'S NEUROMA Left 1978   left foot  . GLAUCOMA SURGERY Bilateral    and laser surgery, 2004 left, 2008 right  . PARTIAL HYSTERECTOMY  1991   Left an ovary  . POLYPECTOMY    . TOENAIL AVULSION Right    big toe         Allergies  Allergen Reactions  . Aspirin Anaphylaxis  . Bee Pollen     Extreme swelling due to bee stings  . Codeine Nausea And Vomiting  .  Fish Oil Diarrhea  . Sulfa Antibiotics Nausea And Vomiting  . Camphor Dermatitis  . Camphor Dermatitis  . Lisinopril Cough  . Penicillins Rash    DID THE REACTION INVOLVE: Swelling of the face/tongue/throat, SOB, or low BP? Yes Sudden or severe rash/hives, skin peeling, or the inside of the mouth or nose? Unknown Did it require medical treatment? Unknown When did it last happen? If all above answers are "NO", may proceed with cephalosporin use.   . Sulfasalazine Nausea And Vomiting  . Vicodin [Hydrocodone-Acetaminophen] Nausea And Vomiting        MEDICATIONS  Medication Sig  . Acetaminophen (TYLENOL ARTHRITIS PAIN PO) Take 650 mg by mouth every 6 (six) hours as needed (pain).   [name] levothyroxine  (SYNTHROID, LEVOTHROID) 50 MCG tablet Take 50 mcg by mouth daily before breakfast.   . LORazepam (ATIVAN) 0.5 MG tablet Take 1 tablet (0.5 mg total) by mouth daily as needed for up to 14 days for anxiety.  . metFORMIN (GLUCOPHAGE) 500 MG tablet Take 500 mg by mouth 2 (two) times daily with a meal.         . Multiple Vitamin (MULTIVITAMIN WITH MINERALS) TABS tablet Take 1 tablet by mouth daily.   . Nutritional Supplement LIQD Take 90 mLs by mouth daily. Med Pass  . ondansetron (ZOFRAN) 4 MG tablet GIVE 1 TABLET BY MOUTH EVERY 6 HOURS AS NEEDED FOR NAUSEA/VOMITING  . ONETOUCH VERIO test strip USE TO TEST BLOOD GLUCOSE ONCE DAILY  . pantoprazole (PROTONIX) 40 MG tablet TAKE 1 TABLET BY MOUTH DAILY BEFORE BREAKFAST  . polyvinyl alcohol (LIQUIFILM TEARS) 1.4 % ophthalmic solution    .     .    Of note Demadex and potassium are currently on hold Lopressor has been discontinued   Review of systems.  In general she is not complaining of fever chills says she is feeling somewhat stronger.  Skin does not complain of rashes or itching.  Head ears eyes nose mouth and throat does not complain of visual changes or sore throat.  Respiratory is not complaining of shortness of breath or cough.  Cardiac does not complain of chest pain edema appears to be controlled but she does feel that the fingers on her hands are getting somewhat tighter ring finger is feeling tight she says this happens at times when she gains fluid.  GI is not complaining of abdominal pain nausea or vomiting at times she has had some nauseous episodes.  GU does not complain of dysuria.  Musculoskeletal does not complain of joint pain other than toe discomfort right great toe she says this is feeling somewhat better however.  -This is in the context of gout.  Neurologic does not complain of dizziness headache or numbness or syncope recently.  Psych does have some history of some anxiety and depression is still mourning  the loss of her husband about a year ago but appears to be in better spirits today  Physical exam. Temp 98.8 pulse is 104 respirations 18 blood pressure 120/70.  In general this is a pleasant elderly female who appears to be in relatively better spirits she is somewhat more energetic.  Her skin is warm and dry.  Eyes visual acuity appears to be intact sclera and conjunctive are clear  Pharynx is clear mucous membranes moist.  Chest clear to auscultation there is no labored breathing.  Heart is mildly tachycardic could not appreciate murmur gallop or rub she has mild lower extremity edema.  Abdomen is soft  nontender somewhat obese with positive bowel sounds.  Musculoskeletal does move all extremities x4 with some lower extremity weakness limited mobility of her right lower extremities since she does have a cam walker.  Right great toe still has some minimal tenderness to palpation I do not note any increased erythema or edema this is a site of her gout flare  Neurologic is grossly intact her speech is clear no lateralizing findings.  Psych she is alert and oriented very pleasant and appropriate  Labs February 07, 2019.  Sodium 140 potassium 4.6 BUN 44.4 creatinine 1.2.  Creatinine kinase 973- CRP 1.22-.   February 05, 2019.  WBC 10.3 hemoglobin 10.6 platelets 426.  Sodium 140 potassium 5.6 BUN 56.3 creatinine 1.68.   Assessment and plan.  1.  History of chronic kidney disease-this actually appears to be improved with a creatinine now of 1.2 BUN in the 40s- she had been on Demadex with potassium which was held because of rising creatinine and potassium of 5.6.  Potassium is now 4.6.  She has complained of some increased edema- since renal function is improved will cautiously start torsemide 20 mg a day with potassium supplementation but this will have to be watched closely and will update a metabolic panel on Monday.  2.-  History of diastolic CHF as noted above we will  cautiously restart her diuretic she at one point had been on 50 mg of Demadex but this has been reduced down to 20- will restart this and cautiously monitor her metabolic panel and edema.  3.  Hypotension this appears improved off the Lopressor however she is starting to have some tachycardia will restart Lopressor at lower dose 12.5 mg a day with parameters to hold this for a systolic below 301.  4.  History of gout she is on low-dose allopurinol she says she actually gets relief with the Voltaren gel at this point will monitor appears to be improving.  Her uric acid actually was 12.  5.  History of elevated creatinine kinase CRP and sed rate as noted above creatinine kinase is down slightly CRP has come down-sed rate is pending.  Her rheumatology appointment earlier this week was cut short because of hypotension concerns of weakness- will need repeat consult with rheumatology  [phone]

## 2019-02-08 NOTE — Telephone Encounter (Signed)
Reviewed notes.  BUN/Creatinine ratio is high. Agree with plan from provider at [facility]. Monitor weight.  If weight increases more than 3 lbs in 1 day or she develops increased leg swelling, would resume Torsemide 20 mg once daily with a follow up BMET 1 week later. Richardson Dopp, PA-C    02/08/2019 1:36 PM

## 2019-02-08 NOTE — Telephone Encounter (Signed)
Spoke with patient and patient aware of recommendations Recommendations will be faxed over to facility for [facility] provider.

## 2019-02-09 ENCOUNTER — Encounter: Payer: Self-pay | Admitting: Internal Medicine

## 2019-02-11 ENCOUNTER — Encounter: Payer: Self-pay | Admitting: Internal Medicine

## 2019-02-11 ENCOUNTER — Non-Acute Institutional Stay (SKILLED_NURSING_FACILITY): Payer: Medicare Other | Admitting: Internal Medicine

## 2019-02-11 DIAGNOSIS — S82891D Other fracture of right lower leg, subsequent encounter for closed fracture with routine healing: Secondary | ICD-10-CM

## 2019-02-11 DIAGNOSIS — I5032 Chronic diastolic (congestive) heart failure: Secondary | ICD-10-CM

## 2019-02-11 DIAGNOSIS — E118 Type 2 diabetes mellitus with unspecified complications: Secondary | ICD-10-CM

## 2019-02-11 DIAGNOSIS — I959 Hypotension, unspecified: Secondary | ICD-10-CM | POA: Diagnosis not present

## 2019-02-11 DIAGNOSIS — G894 Chronic pain syndrome: Secondary | ICD-10-CM

## 2019-02-11 LAB — BASIC METABOLIC PANEL
BUN: 23 — AB (ref 4–21)
Creatinine: 1.1 (ref 0.5–1.1)
Glucose: 118
Potassium: 4.1 (ref 3.4–5.3)
Sodium: 141 (ref 137–147)

## 2019-02-11 NOTE — Progress Notes (Signed)
Location:  [facility] Room Number: 119-A Place of Service:  SNF [phone]) Provider:  Granville Lewis, PA-C  [name], MD  Patient Care Team: [name], MD as PCP - General (Family Medicine) Nahser, Wonda Cheng, MD as PCP - Cardiology (Cardiology)  Extended Emergency Contact Information Primary Emergency Contact: [place], [place] Phone: [phone] Mobile Phone: [phone] Relation: Sister  Code Status:  Full Code Goals of care: Advanced Directive information Advanced Directives 01/29/2019  Does Patient Have a Medical Advance Directive? Yes  Type of Advance Directive (No Data)  Does patient want to make changes to medical advance directive? -  Would patient like information on creating a medical advance directive? -     Chief Complaint  Patient presents with  . Medical Management of Chronic Issues    Routine [facility] visit   Medical management of chronic medical issues including history of minor right ankle fracture-hypothyroidism hypertension GERD dyslipidemia sleep apnea osteoporosis depression history of diastolic CHF.    HPI:  Pt is a 73 y.o. female seen today for medical management of chronic diseases.  As noted above.  She has been here for short-term rehab with a minor right ankle fracture which is followed by orthopedics she continues in a cam boot.  She appears to be getting stronger in this regards.  Her stay here has been somewhat complicated by weakness and hypotension and her Lopressor was discontinued temporarily and then restarted at a lower dose because she was somewhat tachycardic.  Blood pressures appear to have improved somewhat recent blood pressures 114/59 129/64 119/65 I do see a couple days ago at 95/54 but this has become somewhat less frequent.  Clinically she feels she is getting stronger in this regards.  She was noted to have an elevated C-reactive protein and creatinine kinase as well as sed rate and she did  see rheumatology but the appointment was cut short because she was having low blood pressures and weakness-this will need to be rescheduled again she appears to be doing somewhat better in this regards.  At one point her Demadex was held she does have a history of diastolic CHF.  She did states she thought she was having some mildly increased edema and we have restarted this with the caution inregard to  her renal function which appears to be stable.  At one point her creatinine had risen and again the [facility] was held because of this as well.  Labs done today are encouraging and shows somewhat stable renal function at a creatinine of 1.09.  Potassium has normalized at 4.1 it had been slightly high at one-point.  She has been restarted on Demadex at a lower dose 20 mg a day and potassium 3 times a week.  This will need updating about a week.  She does have a history of chronic pain she does have an order for Tylenol arthritis she does not really tolerate stronger pain medications.  She also was found to have a uric acid of 12 and started on allopurinol she also gets Voltaren gel and this appears to be helping the attack was mainly centered on her right foot toes.  In regards to hypothyroidism she continues on Synthroid TSH was 3.265 on lab done earlier this year.  Currently she is sitting in her wheelchair comfortably does not really have any acute complaints she states she had somewhat of an anxiety attack during therapy where she felt a little jittery and her blood pressure was up but this  was of short duration and after she took some deep breaths apparently the feeling subsided  she denied any chest pain or visual changes or headache or dizziness or acute shortness of breath    this appeared to be more of a anxiety situation but will have to be monitored.  She does have a PRN Ativan ordered through June 20       Past Medical History:  Diagnosis Date  . Allergy    bee stings   . Anxiety   . Asthmatic bronchitis   . Broken arm 1957/2008   right  . Cataract    had surgery   . Depression   . Diabetes mellitus without complication ([place])    prediabetes diet controlled  . Diverticulosis   . GERD (gastroesophageal reflux disease)   . Glaucoma    BIL  . Hemorrhoids   . Hx of adenomatous colonic polyps 12/02/2010  . Hyperlipidemia   . Hypertension   . Hypothyroidism 05/2018  . Inflammatory arthritis   . Knee bursitis   . Migraines   . Obesity   . Osteoarthritis   . Sleep apnea    Past Surgical History:  Procedure Laterality Date  . CARPAL TUNNEL RELEASE Left    left wrist  . CATARACT EXTRACTION W/ INTRAOCULAR LENS  IMPLANT, BILATERAL Bilateral    1999 left, 2008 right  . COLONOSCOPY    . EXCISION MORTON'S NEUROMA Left 1978   left foot  . GLAUCOMA SURGERY Bilateral    and laser surgery, 2004 left, 2008 right  . PARTIAL HYSTERECTOMY  1991   Left an ovary  . POLYPECTOMY    . TOENAIL AVULSION Right    big toe    Allergies  Allergen Reactions  . Aspirin Anaphylaxis  . Bee Pollen     Extreme swelling due to bee stings  . Codeine Nausea And Vomiting  . Fish Oil Diarrhea  . Sulfa Antibiotics Nausea And Vomiting  . Camphor Dermatitis  . Camphor Dermatitis  . Lisinopril Cough  . Penicillins Rash    DID THE REACTION INVOLVE: Swelling of the face/tongue/throat, SOB, or low BP? Yes Sudden or severe rash/hives, skin peeling, or the inside of the mouth or nose? Unknown Did it require medical treatment? Unknown When did it last happen? If all above answers are "NO", may proceed with cephalosporin use.   . Sulfasalazine Nausea And Vomiting  . Vicodin [Hydrocodone-Acetaminophen] Nausea And Vomiting    Outpatient Encounter Medications as of 02/11/2019  Medication Sig  . Acetaminophen (TYLENOL ARTHRITIS PAIN PO) Take 650 mg by mouth every 6 (six) hours as needed (pain).   [name] allopurinol (ZYLOPRIM) 100 MG tablet Take 100 mg by mouth daily.  .  diclofenac sodium (VOLTAREN) 1 % GEL Apply 2 g topically. Apply to toes and right foot  . levothyroxine (SYNTHROID, LEVOTHROID) 50 MCG tablet Take 50 mcg by mouth daily before breakfast.   . lisinopril (ZESTRIL) 40 MG tablet Take 40 mg by mouth daily.  [name] LORazepam (ATIVAN) 0.5 MG tablet Take 1 tablet (0.5 mg total) by mouth daily as needed for up to 14 days for anxiety.  . metFORMIN (GLUCOPHAGE) 500 MG tablet Take 500 mg by mouth 2 (two) times daily with a meal.   . metoprolol tartrate (LOPRESSOR) 25 MG tablet Take 12.5 mg by mouth 2 (two) times daily. Take 0.5 tablet to = 12.5 mg if SBP >110  . Multiple Vitamin (MULTIVITAMIN WITH MINERALS) TABS tablet Take 1 tablet by mouth daily.   [name]  Nutritional Supplement LIQD Take 90 mLs by mouth daily. Med Pass  . ondansetron (ZOFRAN) 4 MG tablet GIVE 1 TABLET BY MOUTH EVERY 6 HOURS AS NEEDED FOR NAUSEA/VOMITING  . pantoprazole (PROTONIX) 40 MG tablet TAKE 1 TABLET BY MOUTH  DAILY BEFORE BREAKFAST  . polyvinyl alcohol (LIQUIFILM TEARS) 1.4 % ophthalmic solution Place 1 drop into both eyes every 4 (four) hours as needed for dry eyes.  . potassium chloride SA (K-DUR) 20 MEQ tablet Take 20 mEq by mouth every Monday, Wednesday, and Friday.  . potassium chloride SA (K-DUR,KLOR-CON) 20 MEQ tablet Take 40 mEq by mouth daily. Take 2 tablets to = 40 mEq qd  . torsemide (DEMADEX) 20 MG tablet Take 20 mg by mouth daily.  Glory Rosebush VERIO test strip USE TO TEST BLOOD GLUCOSE ONCE DAILY  . [DISCONTINUED] metoprolol tartrate (LOPRESSOR) 25 MG tablet Take 1 tablet (25 mg total) by mouth 2 (two) times daily.  . [DISCONTINUED] torsemide (DEMADEX) 100 MG tablet Take 0.5 tablets (50 mg total) by mouth daily.   No facility-administered encounter medications on file as of 02/11/2019.     Review of Systems   General is not complaining of any fever chills appears to be gradually feeling stronger.  Skin is not complaining of rashes or itching.  Head ears eyes nose mouth and  throat does not complain of visual changes or sore throat   has prescription lenses.  Respiratory does not complain of being short of breath or having a cough.  Cardiac does not complain of chest pain appears to have quite mild lower extremity edema did complain of some edema of her fingers the other day but this appears to have improved with initiation of Demadex.  GI at one point did complain of some nausea with vomiting but has not complained of that recently she does have Zofran as needed does not complain of abdominal pain or constipation.  GU is not complaining of dysuria.  Musculoskeletal does have the minor right ankle fracture has a cam boot at times does have some discomfort but says she does not tolerate much anything stronger than Tylenol-she also receives relief with Voltaren gel.  Neurologic does not complain of dizziness headache or numbness does has have some weakness which appears to be gradually improving.  Psych does not complain of overt anxiety or depression at this point she has been mourning the loss of her husband about a year ago but appears to be doing a bit better in this regards also apparently at therapy today she had somewhat of an anxiety attack which will have to be monitored.  She does have a short course of PRN Ativan daily up till June 20         Immunization History  Administered Date(s) Administered  . Pneumococcal Conjugate-13 02/12/2014  . Tdap 12/31/2008   Pertinent  Health Maintenance Due  Topic Date Due  . MAMMOGRAM  02/14/2019 (Originally 10/22/1995)  . OPHTHALMOLOGY EXAM  02/14/2019 (Originally 10/22/1955)  . URINE MICROALBUMIN  02/14/2019 (Originally 10/22/1955)  . DEXA SCAN  02/14/2019 (Originally 10/21/2010)  . PNA vac Low Risk Adult (2 of 2 - PPSV23) 02/14/2019 (Originally 02/13/2015)  . HEMOGLOBIN A1C  03/10/2019  . INFLUENZA VACCINE  03/30/2019  . FOOT EXAM  06/20/2019  . COLONOSCOPY  06/01/2023   Fall Risk  10/30/2018 07/11/2016  01/07/2016  Falls in the past year? 0 No No   Functional Status Survey:    Vitals:   02/11/19 0830  BP: 121/67  Pulse:  89  Temp: 97.7 F (36.5 C)  TempSrc: Oral  SpO2: 94%  Weight: 199 lb 3.2 oz (90.4 kg)  Height: 5\' 2"  (1.575 m)   Body mass index is 36.43 kg/m. Physical Exam   In general this is a pleasant elderly female who appears to be in no distress sitting comfortably in her wheelchair.  Her skin is warm and dry.  Eyes visual acuity appears to be intact sclera and conjunctive are clear she has prescription lenses.  Oropharynx is clear mucous membranes moist.  Chest is clear to auscultation there is no labored breathing.  Heart is borderline tachycardic at just over 100 she has very mild lower extremity edema.  Abdomen is somewhat obese soft nontender with positive bowel sounds.  Musculoskeletal she is able to move all extremities x4 with weakness of her lower extremities most prominently at her right lower extremity which has a cam walker.  Her right great toe does not appear to have any increased edema or erythema and less tenderness to palpation than apparent last week.  Neurologic appears grossly intact her speech is clear without lateralizing findings.  Psych she is alert and oriented pleasant and appropriate   Labs reviewed: Recent Labs    09/13/18 0447 09/14/18 0403 09/15/18 0502 09/15/18 0834 09/16/18 0359 09/17/18 0418 11/05/18 1028 11/28/18 0944  NA 141 139 138  --  140 140 145* 144  K 3.3* 3.0* 3.2*  --  3.5 3.7 4.5 4.0  CL 101 96* 94*  --  96* 94* 102 99  CO2 29 31 33*  --  34* 35* 23 25  GLUCOSE 179* 180* 182*  --  177* 204* 113* 119*  BUN 51* 52* 57*  --  55* 57* 29* 25  CREATININE 1.22* 1.15* 1.40*  --  1.19* 1.22* 1.29* 1.17*  CALCIUM 8.3* 8.3* 8.0*  --  8.3* 8.4* 8.1* 7.7*  MG 1.8 1.9  --  1.9  --   --   --   --   PHOS  --   --  4.1  --  3.5 3.6  --   --    Recent Labs    09/04/18 1052 09/08/18 1609 09/09/18 0824 09/15/18  0502 09/16/18 0359 09/17/18 0418  AST 13* 15 15  --   --   --   ALT 8 10 8   --   --   --   ALKPHOS 83 77 63  --   --   --   BILITOT 0.3 0.8 0.2*  --   --   --   PROT 6.4* 6.4* 5.7*  --   --   --   ALBUMIN 2.9* 3.2* 2.7* 3.0* 2.9* 3.1*   Recent Labs    09/08/18 1720  09/13/18 0447 09/14/18 0403 11/28/18 0944  WBC 8.7   < > 7.4 7.1 9.4  NEUTROABS 6.5  --  4.9 4.8  --   HGB 9.1*   < > 9.1* 8.9* 11.4  HCT 30.9*   < > 30.4* 29.4* 35.5  MCV 93.9   < > 91.0 91.0 84  PLT 261   < > 285 258 401   < > = values in this interval not displayed.   Lab Results  Component Value Date   TSH 3.265 09/09/2018   Lab Results  Component Value Date   HGBA1C 7.1 (H) 09/09/2018   No results found for: CHOL, HDL, LDLCALC, LDLDIRECT, TRIG, CHOLHDL  Significant Diagnostic Results in last 30 days:  No results found.  Assessment/Plan  #1 history of minor right ankle fracture she has been followed by orthopedics continues with a cam walker and does work with therapy apparently she is doing a bit better in this regards continue to monitor.  2.  History of hypotension-again her Lopressor has been discontinued initially and then restarted at a lower dose this appears to be fairly well tolerated we are trying to keep her blood pressure relatively stable while controlling her pulse rate-she continues at times to be mildly tachycardic but clinically appears to be doing well at this point will monitor.  3.  History of diastolic CHF again her Demadex has been restarted at a lower dose at this point appears to be well-tolerated per labs done today will update a lab in about a week to make sure this remains stable at one point her creatinine had risen up to over 1.6 but now is back down to the low ones potassium also has normalized it was slightly high at one point at 5.6.  4.-History of elevated sed rate creatinine kinase and C-reactive protein these actually appear to have trended down recently she has seen  rheumatology but appointment was cut short because of weakness and hypotension this is to be rescheduled.  She has seen rheumatology before for weakness it appears.  5.-  History of type 2 diabetes she is on Glucophage this appears to be under decent control hemoglobin A1c was 7.1 on lab done in January- CBGs appear to be more in the lower mid 100s appears to have pretty infrequent readings below 100 or in the 200 range  6.  History of hypothyroidism she continues on Synthroid most recent TSH was 3.265 which appears well within normal range at this point will monitor.  7.  History of gout she has been started on low-dose allopurinol she is also receives Voltaren gel she says actually Voltaren gel helped quite a bit this appears to have improved.  8.-History of chronic pain she does have a history of intolerance to opiates apparently has hallucinations- she continues on Tylenol arthritis as well as the Voltaren gel which apparently is giving her fairly decent relief.  #9 history of GERD she continues on a proton pump inhibitor  #10 anxiety she does have an order for Ativan 0.5 mg once a day as needed up till June 20  [id number]

## 2019-02-12 ENCOUNTER — Ambulatory Visit: Payer: Medicare Other | Admitting: Podiatry

## 2019-02-13 ENCOUNTER — Encounter: Payer: Self-pay | Admitting: Internal Medicine

## 2019-02-15 ENCOUNTER — Non-Acute Institutional Stay (SKILLED_NURSING_FACILITY): Payer: Medicare Other | Admitting: Adult Health

## 2019-02-15 ENCOUNTER — Encounter: Payer: Self-pay | Admitting: Adult Health

## 2019-02-15 DIAGNOSIS — M1A9XX0 Chronic gout, unspecified, without tophus (tophi): Secondary | ICD-10-CM | POA: Diagnosis not present

## 2019-02-15 DIAGNOSIS — S82891D Other fracture of right lower leg, subsequent encounter for closed fracture with routine healing: Secondary | ICD-10-CM

## 2019-02-15 DIAGNOSIS — I110 Hypertensive heart disease with heart failure: Secondary | ICD-10-CM

## 2019-02-15 DIAGNOSIS — F419 Anxiety disorder, unspecified: Secondary | ICD-10-CM

## 2019-02-15 DIAGNOSIS — E876 Hypokalemia: Secondary | ICD-10-CM

## 2019-02-15 DIAGNOSIS — E118 Type 2 diabetes mellitus with unspecified complications: Secondary | ICD-10-CM | POA: Diagnosis not present

## 2019-02-15 DIAGNOSIS — E034 Atrophy of thyroid (acquired): Secondary | ICD-10-CM

## 2019-02-15 DIAGNOSIS — K219 Gastro-esophageal reflux disease without esophagitis: Secondary | ICD-10-CM

## 2019-02-15 DIAGNOSIS — I5032 Chronic diastolic (congestive) heart failure: Secondary | ICD-10-CM

## 2019-02-15 MED ORDER — METFORMIN HCL 500 MG PO TABS: 500.0000 mg | ORAL_TABLET | Freq: Two times a day (BID) | ORAL | 0 refills | Status: DC

## 2019-02-15 MED ORDER — LORAZEPAM 0.5 MG PO TABS: 0.5000 mg | ORAL_TABLET | Freq: Every day | ORAL | 0 refills | Status: DC | PRN

## 2019-02-15 MED ORDER — METOPROLOL TARTRATE 25 MG PO TABS: 12.5000 mg | ORAL_TABLET | Freq: Two times a day (BID) | ORAL | 0 refills | Status: DC

## 2019-02-15 MED ORDER — ONDANSETRON HCL 4 MG PO TABS: 4.0000 mg | ORAL_TABLET | Freq: Four times a day (QID) | ORAL | 0 refills | Status: DC | PRN

## 2019-02-15 MED ORDER — PANTOPRAZOLE SODIUM 40 MG PO TBEC: 40.0000 mg | DELAYED_RELEASE_TABLET | Freq: Every day | ORAL | 0 refills | Status: DC

## 2019-02-15 MED ORDER — POTASSIUM CHLORIDE CRYS ER 20 MEQ PO TBCR: 20.0000 meq | EXTENDED_RELEASE_TABLET | ORAL | 0 refills | Status: DC

## 2019-02-15 MED ORDER — TORSEMIDE 20 MG PO TABS: 20.0000 mg | ORAL_TABLET | Freq: Every day | ORAL | 0 refills | Status: DC

## 2019-02-15 MED ORDER — ALLOPURINOL 100 MG PO TABS: 100.0000 mg | ORAL_TABLET | Freq: Every day | ORAL | 0 refills | Status: DC

## 2019-02-15 MED ORDER — LEVOTHYROXINE SODIUM 50 MCG PO TABS: 50.0000 ug | ORAL_TABLET | Freq: Every day | ORAL | 0 refills | Status: DC

## 2019-02-15 NOTE — Progress Notes (Addendum)
Location:  [facility] Room Number: 119-A Place of Service:  SNF (31) Provider:  Durenda Age, DNP, FNP-BC  Patient Care Team: [name], MD as PCP - General (Family Medicine) Nahser, Wonda Cheng, MD as PCP - Cardiology (Cardiology)  Extended Emergency Contact Information Primary Emergency Contact: [place], [place] Phone: [phone] Mobile Phone: [phone] Relation: Sister  Code Status:  Full Code  Goals of care: Advanced Directive information Advanced Directives 01/29/2019  Does Patient Have a Medical Advance Directive? Yes  Type of Advance Directive (No Data)  Does patient want to make changes to medical advance directive? -  Would patient like information on creating a medical advance directive? -     Chief Complaint  Patient presents with  . Discharge Note    Patient is seen for discharge    HPI:  Pt is a 73 y.o. female seen today for a discharge visit. She will discharge home with home health OT, PT, and Nursing services.   She has been admitted to [facility] on 01/11/19 from a recent hospitalization for a minor right ankle fracture and knee pain sustained from a fall. She has a PMH of anxiety, depression, DM without complications, glaucoma, HLD, HTN, and hypothyroidism. She has been admitted for a short-term rehabilitation.    Past Medical History:  Diagnosis Date  . Allergy    bee stings  . Anxiety   . Asthmatic bronchitis   . Broken arm 1957/2008   right  . Cataract    had surgery   . Depression   . Diabetes mellitus without complication ([place])    prediabetes diet controlled  . Diverticulosis   . GERD (gastroesophageal reflux disease)   . Glaucoma    BIL  . Hemorrhoids   . Hx of adenomatous colonic polyps 12/02/2010  . Hyperlipidemia   . Hypertension   . Hypothyroidism 05/2018  . Inflammatory arthritis   . Knee bursitis   . Migraines   . Obesity   . Osteoarthritis   . Sleep apnea    Past Surgical History:  Procedure Laterality Date  . CARPAL TUNNEL RELEASE Left    left wrist  . CATARACT EXTRACTION W/ INTRAOCULAR LENS  IMPLANT, BILATERAL Bilateral    1999 left, 2008 right  . COLONOSCOPY    . EXCISION MORTON'S NEUROMA Left 1978   left foot  . GLAUCOMA SURGERY Bilateral    and laser surgery, 2004 left, 2008 right  . PARTIAL HYSTERECTOMY  1991   Left an ovary  . POLYPECTOMY    . TOENAIL AVULSION Right    big toe    Allergies  Allergen Reactions  . Aspirin Anaphylaxis  . Bee Pollen     Extreme swelling due to bee stings  . Codeine Nausea And Vomiting  . Fish Oil Diarrhea  . Sulfa Antibiotics Nausea And Vomiting  . Camphor Dermatitis  . Camphor Dermatitis  . Lisinopril Cough  . Penicillins Rash    DID THE REACTION INVOLVE: Swelling of the face/tongue/throat, SOB, or low BP? Yes Sudden or severe rash/hives, skin peeling, or the inside of the mouth or nose? Unknown Did it require medical treatment? Unknown When did it last happen? If all above answers are "NO", may proceed with cephalosporin use.   . Sulfasalazine Nausea And Vomiting  . Vicodin [Hydrocodone-Acetaminophen] Nausea And Vomiting    Outpatient Encounter Medications as of 02/15/2019  Medication Sig  . Acetaminophen (TYLENOL ARTHRITIS PAIN PO) Take 650 mg by mouth every 6 (six) hours  as needed (pain).   [name] allopurinol (ZYLOPRIM) 100 MG tablet Take 100 mg by mouth daily.  . diclofenac sodium (VOLTAREN) 1 % GEL Apply 2 g topically. Apply to toes and right foot  . levothyroxine (SYNTHROID, LEVOTHROID) 50 MCG tablet Take 50 mcg by mouth daily before breakfast.   . lisinopril (ZESTRIL) 40 MG tablet Take 40 mg by mouth daily.  [name] LORazepam (ATIVAN) 0.5 MG tablet Take 1 tablet (0.5 mg total) by mouth daily as needed for up to 14 days for anxiety.  . metFORMIN (GLUCOPHAGE) 500 MG tablet Take 500 mg by mouth 2 (two) times daily with a meal.   . metoprolol tartrate (LOPRESSOR) 25 MG tablet Take 12.5  mg by mouth 2 (two) times daily. Take 0.5 tablet to = 12.5 mg if SBP >110  . Multiple Vitamin (MULTIVITAMIN WITH MINERALS) TABS tablet Take 1 tablet by mouth daily.   . Nutritional Supplement LIQD Take 90 mLs by mouth daily. Med Pass  . ondansetron (ZOFRAN) 4 MG tablet GIVE 1 TABLET BY MOUTH EVERY 6 HOURS AS NEEDED FOR NAUSEA/VOMITING  . ONETOUCH VERIO test strip USE TO TEST BLOOD GLUCOSE ONCE DAILY  . pantoprazole (PROTONIX) 40 MG tablet TAKE 1 TABLET BY MOUTH  DAILY BEFORE BREAKFAST  . polyvinyl alcohol (LIQUIFILM TEARS) 1.4 % ophthalmic solution Place 1 drop into both eyes every 4 (four) hours as needed for dry eyes.  . potassium chloride SA (K-DUR) 20 MEQ tablet Take 20 mEq by mouth every Monday, Wednesday, and Friday.  . potassium chloride SA (K-DUR,KLOR-CON) 20 MEQ tablet Take 40 mEq by mouth daily. Take 2 tablets to = 40 mEq qd  . torsemide (DEMADEX) 20 MG tablet Take 20 mg by mouth daily.   No facility-administered encounter medications on file as of 02/15/2019.     Review of Systems  GENERAL: No change in appetite, no fatigue, no weight changes, no fever, chills or weakness MOUTH and THROAT: Denies oral discomfort, gingival pain or bleeding, pain from teeth or hoarseness   RESPIRATORY: no cough, SOB, DOE, wheezing, hemoptysis CARDIAC: No chest pain, or palpitations GI: No abdominal pain, diarrhea, constipation, heart burn, nausea or vomiting GU: Denies dysuria, frequency, hematuria, incontinence, or discharge NEUROLOGICAL: Denies dizziness, syncope, numbness, or headache PSYCHIATRIC: Denies feelings of depression or anxiety. No report of hallucinations, insomnia, paranoia, or agitation   Immunization History  Administered Date(s) Administered  . Pneumococcal Conjugate-13 02/12/2014  . Tdap 12/31/2008   Pertinent  Health Maintenance Due  Topic Date Due  . OPHTHALMOLOGY EXAM  10/22/1955  . MAMMOGRAM  10/22/1995  . DEXA SCAN  10/21/2010  . PNA vac Low Risk Adult (2 of 2 -  PPSV23) 02/13/2015  . HEMOGLOBIN A1C  03/10/2019  . INFLUENZA VACCINE  03/30/2019  . FOOT EXAM  06/20/2019  . COLONOSCOPY  06/01/2023   Fall Risk  10/30/2018 07/11/2016 01/07/2016  Falls in the past year? 0 No No     Vitals:   02/15/19 1430  BP: 110/60  Pulse: 94  Resp: 20  Temp: 98.3 F (36.8 C)  TempSrc: Oral  Weight: 198 lb 9.6 oz (90.1 kg)  Height: 5\' 2"  (1.575 m)   Body mass index is 36.32 kg/m.  Physical Exam  GENERAL APPEARANCE: Well nourished. In no acute distress.Obese SKIN:  Skin is warm and dry. MOUTH and THROAT: Lips are without lesions. Oral mucosa is moist and without lesions. Tongue is normal in shape, size, and color and without lesions RESPIRATORY: Breathing is even & unlabored, BS CTAB  CARDIAC: RRR, no murmur,no extra heart sounds, LLE 1+ edema GI: Abdomen soft, normal BS, no masses, no tenderness EXTREMITIES:  Able to move X 4 extremities NEUROLOGICAL: There is no tremor. Speech is clear. Alert and oriented X 3. PSYCHIATRIC:  Affect and behavior are appropriate  Labs reviewed: Recent Labs    09/13/18 0447 09/14/18 0403 09/15/18 0502 09/15/18 0834 09/16/18 0359 09/17/18 0418  11/05/18 1028 11/28/18 0944 02/07/19 02/11/19  NA 141 139 138  --  140 140   < > 145* 144 140 141  K 3.3* 3.0* 3.2*  --  3.5 3.7  --  4.5 4.0 4.6 4.1  CL 101 96* 94*  --  96* 94*  --  102 99  --   --   CO2 29 31 33*  --  34* 35*  --  23 25  --   --   GLUCOSE 179* 180* 182*  --  177* 204*  --  113* 119*  --   --   BUN 51* 52* 57*  --  55* 57*   < > 29* 25 44* 23*  CREATININE 1.22* 1.15* 1.40*  --  1.19* 1.22*  --  1.29* 1.17* 1.2* 1.1  CALCIUM 8.3* 8.3* 8.0*  --  8.3* 8.4*  --  8.1* 7.7*  --   --   MG 1.8 1.9  --  1.9  --   --   --   --   --   --   --   PHOS  --   --  4.1  --  3.5 3.6  --   --   --   --   --    < > = values in this interval not displayed.   Recent Labs    09/04/18 1052 09/08/18 1609 09/09/18 0824 09/15/18 0502 09/16/18 0359 09/17/18 0418  AST 13*  15 15  --   --   --   ALT 8 10 8   --   --   --   ALKPHOS 83 77 63  --   --   --   BILITOT 0.3 0.8 0.2*  --   --   --   PROT 6.4* 6.4* 5.7*  --   --   --   ALBUMIN 2.9* 3.2* 2.7* 3.0* 2.9* 3.1*   Recent Labs    09/08/18 1720  09/13/18 0447 09/14/18 0403 11/28/18 0944  WBC 8.7   < > 7.4 7.1 9.4  NEUTROABS 6.5  --  4.9 4.8  --   HGB 9.1*   < > 9.1* 8.9* 11.4  HCT 30.9*   < > 30.4* 29.4* 35.5  MCV 93.9   < > 91.0 91.0 84  PLT 261   < > 285 258 401   < > = values in this interval not displayed.   Lab Results  Component Value Date   TSH 3.265 09/09/2018   Lab Results  Component Value Date   HGBA1C 7.1 (H) 09/09/2018    Assessment/Plan  1. Closed fracture of right ankle with routine healing, subsequent encounter - continue use of CAM boot, will have home health PT, OT and Nursing, fall precautions, follow- up with orthopedics  2. Hypertensive heart disease with chronic diastolic congestive heart failure (HCC) - metoprolol tartrate (LOPRESSOR) 25 MG tablet; Take 0.5 tablets (12.5 mg total) by mouth 2 (two) times daily. Take 0.5 tablet to = 12.5 mg if SBP >110  Dispense: 60 tablet; Refill: 0 - torsemide (DEMADEX) 20 MG tablet; Take  1 tablet (20 mg total) by mouth daily.  Dispense: 30 tablet; Refill: 0  3. DM (diabetes mellitus), type 2 with complications (HCC) Lab Results  Component Value Date   HGBA1C 7.1 (H) 09/09/2018   - metFORMIN (GLUCOPHAGE) 500 MG tablet; Take 1 tablet (500 mg total) by mouth 2 (two) times daily with a meal.  Dispense: 60 tablet; Refill: 0  4. Chronic gout involving toe of right foot without tophus, unspecified cause - allopurinol (ZYLOPRIM) 100 MG tablet; Take 1 tablet (100 mg total) by mouth daily.  Dispense: 30 tablet; Refill: 0  5. Hypothyroidism due to acquired atrophy of thyroid Lab Results  Component Value Date   TSH 3.265 09/09/2018   - levothyroxine (SYNTHROID) 50 MCG tablet; Take 1 tablet (50 mcg total) by mouth daily before breakfast.   Dispense: 30 tablet; Refill: 0  6. Gastroesophageal reflux disease without esophagitis - pantoprazole (PROTONIX) 40 MG tablet; Take 1 tablet (40 mg total) by mouth daily.  Dispense: 30 tablet; Refill: 0  7. Anxiety - LORazepam (ATIVAN) 0.5 MG tablet; Take 1 tablet (0.5 mg total) by mouth daily as needed for up to 14 days for anxiety.  Dispense: 14 tablet; Refill: 0  8. Hypokalemia Lab Results  Component Value Date   K 4.1 02/11/2019   - potassium chloride SA (K-DUR) 20 MEQ tablet; Take 1 tablet (20 mEq total) by mouth every Monday, Wednesday, and Friday.  Dispense: 12 tablet; Refill: 0 - for BMP on 6/22    I have filled out patient's discharge paperwork and e-prescriptions sent to [facility]. She will have/10 mg XL did not right ventricle continue with home health PT, OT and Nursing.  DME provided:  None  Total discharge time: Greater than 30 minutes  Discharge time involved coordination of the discharge process with social worker, nursing staff and therapy department. Medical justification for home health services verified.   Durenda Age, DNP, FNP-BC [facility] [phone] (Monday-Friday 8:00 a.m. - 5:00 p.m.) [phone] (after hours)

## 2019-02-16 DIAGNOSIS — F419 Anxiety disorder, unspecified: Secondary | ICD-10-CM | POA: Insufficient documentation

## 2019-02-16 DIAGNOSIS — E876 Hypokalemia: Secondary | ICD-10-CM | POA: Insufficient documentation

## 2019-02-16 DIAGNOSIS — M109 Gout, unspecified: Secondary | ICD-10-CM | POA: Insufficient documentation

## 2019-02-16 DIAGNOSIS — E039 Hypothyroidism, unspecified: Secondary | ICD-10-CM | POA: Insufficient documentation

## 2019-02-16 DIAGNOSIS — K219 Gastro-esophageal reflux disease without esophagitis: Secondary | ICD-10-CM | POA: Insufficient documentation

## 2019-02-18 ENCOUNTER — Non-Acute Institutional Stay (SKILLED_NURSING_FACILITY): Payer: Medicare Other | Admitting: Internal Medicine

## 2019-02-18 ENCOUNTER — Telehealth: Payer: Self-pay | Admitting: Physician Assistant

## 2019-02-18 DIAGNOSIS — I9589 Other hypotension: Secondary | ICD-10-CM | POA: Diagnosis not present

## 2019-02-18 DIAGNOSIS — R600 Localized edema: Secondary | ICD-10-CM | POA: Diagnosis not present

## 2019-02-18 DIAGNOSIS — I5032 Chronic diastolic (congestive) heart failure: Secondary | ICD-10-CM | POA: Diagnosis not present

## 2019-02-18 NOTE — Telephone Encounter (Signed)
Pt c/o swelling: STAT is pt has developed SOB within 24 hours  1) How much weight have you gained and in what time span? 10 lbs since Thursday  2) If swelling, where is the swelling located? legse you currently taking a fluid pill?  legs  3) Are you currently SOB?no- extremely weak and exhausted- last blood pressure reading was 81/56e  4) Do you have a log of your daily weights (if so, list)? The Nursing Facility is logging it everyday  5) Have you gained 3 pounds in a day or 5 pounds in a week? Yes- 10 lbs  6) Have you traveled recently?no

## 2019-02-18 NOTE — Telephone Encounter (Signed)
I called [facility] and spoke with pt's nurse Geni Bers) and gave her information from [place], [place]. She will arrange visit with provider at [facility].  She will give pt the message.

## 2019-02-18 NOTE — Telephone Encounter (Signed)
I spoke with pt. She is currently at [facility] for rehab.  She reports weight today is 206.7 lbs.  Per chart her weight was 198 lbs when seen by provider at [facility] on 6/19.  Pt reports her BP was 78/43 this AM.  Rechecked later and it was 81/56.  Since then it has been checked again and last reading was 124/59.  Chart lists pt is taking demadex 20 mg daily.  Pt reports all medicine is given to her by nursing staff.  Pt denies shortness of breath or chest pain.  Does have worsening lower extremity edema.  She feels weak and exhausted.  Diarrhea for last 3 days.  Being treated with imodium. Is following a low salt diet. She does not know when she is due to see provider at facility again. Will forward to Richardson Dopp, [place] for review/recommendations.

## 2019-02-18 NOTE — Telephone Encounter (Signed)
I placed call to [facility] twice but there was no answer.  I tried pt twice and call went to voicemail both times.  Left message to call office tomorrow after 8

## 2019-02-18 NOTE — Telephone Encounter (Signed)
With weight gain and leg swelling, it sounds as though she needs increased diuresis.  But, with hypotension and prolonged diarrhea, she really needs to be evaluated by the attending provider at [facility]. Please request urgent evaluation at her facility. Richardson Dopp, PA-C    02/18/2019 4:55 PM

## 2019-02-18 NOTE — Progress Notes (Signed)
This is an acute visit.  Level of care skilled.  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  Chief complaint-acute visit secondary to possible weight gain-.  History of present illness.  Patient is a pleasant 74 year old female- she was admitted to [facility] initially for a mild right ankle fracture sustained after a fall-she also has a history of anxiety depression diabetes glaucoma hyperlipidemia hypertension hypothyroidism as well as diastolic CHF thought to be grade 1 per cardiology.  Patient's Demadex dose was reduced during her stay here secondary to well-controlled edema as well as concerns over renal insufficiency with a creatinine going up to 1.68 at one point.  Patient feels that she has gained some mildly increased edema and weight gain.  It appears her weights have fluctuated it is 203 today I believe patient thought it was 206.  Last week it was 198 it was 201.6 on June 19.  It appears when she saw cardiology recently weight was actually over 210.  This is complicated with a history of hypotension her lisinopril has been discontinued- she is on low-dose Lopressor secondary to occasional tachycardia.  Blood pressures earlier today were listed at times systolically in the 00T when we took it this evening systolic was in the 622Q.  She does feel that she gets a bit symptomatic at times during therapy.  She does keep in close contact with her cardiology office who follows her quite closely.  Currently she is lying in bed comfortably does not really have any acute complaints except at times she says she has some diarrhea.  We did do labs today which shows a stable creatinine of 1.15 BUN is going up somewhat at 33.7 potassium is 4.6 sodium 137.  Past Medical History:  Diagnosis Date  . Allergy    bee stings  . Anxiety   . Asthmatic bronchitis   . Broken arm 1957/2008   right  . Cataract    had surgery   . Depression   . Diabetes mellitus without complication ([place])     prediabetes diet controlled  . Diverticulosis   . GERD (gastroesophageal reflux disease)   . Glaucoma    BIL  . Hemorrhoids   . Hx of adenomatous colonic polyps 12/02/2010  . Hyperlipidemia   . Hypertension   . Hypothyroidism 05/2018  . Inflammatory arthritis   . Knee bursitis   . Migraines   . Obesity   . Osteoarthritis   . Sleep apnea         Past Surgical History:  Procedure Laterality Date  . CARPAL TUNNEL RELEASE Left    left wrist  . CATARACT EXTRACTION W/ INTRAOCULAR LENS  IMPLANT, BILATERAL Bilateral    1999 left, 2008 right  . COLONOSCOPY    . EXCISION MORTON'S NEUROMA Left 1978   left foot  . GLAUCOMA SURGERY Bilateral    and laser surgery, 2004 left, 2008 right  . PARTIAL HYSTERECTOMY  1991   Left an ovary  . POLYPECTOMY    . TOENAIL AVULSION Right    big toe         Allergies  Allergen Reactions  . Aspirin Anaphylaxis  . Bee Pollen     Extreme swelling due to bee stings  . Codeine Nausea And Vomiting  . Fish Oil Diarrhea  . Sulfa Antibiotics Nausea And Vomiting  . Camphor Dermatitis  . Camphor Dermatitis  . Lisinopril Cough  . Penicillins Rash    DID THE REACTION INVOLVE: Swelling of the face/tongue/throat, SOB, or low BP? Yes Sudden or  severe rash/hives, skin peeling, or the inside of the mouth or nose? Unknown Did it require medical treatment? Unknown When did it last happen? If all above answers are "NO", may proceed with cephalosporin use.   . Sulfasalazine Nausea And Vomiting  . Vicodin [Hydrocodone-Acetaminophen] Nausea And Vomiting      MEDICATIONS      Sig  . Acetaminophen (TYLENOL ARTHRITIS PAIN PO) Take 650 mg by mouth every 6 (six) hours as needed (pain).   [name] allopurinol (ZYLOPRIM) 100 MG tablet Take 100 mg by mouth daily.  . diclofenac sodium (VOLTAREN) 1 % GEL Apply 2 g topically. Apply to toes and right foot  . levothyroxine (SYNTHROID, LEVOTHROID) 50 MCG tablet Take 50 mcg by mouth  daily before breakfast.   . lisinopril (ZESTRIL) 40 MG tablet Take 40 mg by mouth daily.  [name] LORazepam (ATIVAN) 0.5 MG tablet Take 1 tablet (0.5 mg total) by mouth daily as needed for up to 14 days for anxiety.  . metFORMIN (GLUCOPHAGE) 500 MG tablet Take 500 mg by mouth 2 (two) times daily with a meal.   . metoprolol tartrate (LOPRESSOR) 25 MG tablet Take 12.5 mg by mouth 2 (two) times daily. Take 0.5 tablet to = 12.5 mg if SBP >110  . Multiple Vitamin (MULTIVITAMIN WITH MINERALS) TABS tablet Take 1 tablet by mouth daily.   . Nutritional Supplement LIQD Take 90 mLs by mouth daily. Med Pass  . ondansetron (ZOFRAN) 4 MG tablet GIVE 1 TABLET BY MOUTH EVERY 6 HOURS AS NEEDED FOR NAUSEA/VOMITING  . ONETOUCH VERIO test strip USE TO TEST BLOOD GLUCOSE ONCE DAILY  . pantoprazole (PROTONIX) 40 MG tablet TAKE 1 TABLET BY MOUTH  DAILY BEFORE BREAKFAST  . polyvinyl alcohol (LIQUIFILM TEARS) 1.4 % ophthalmic solution Place 1 drop into both eyes every 4 (four) hours as needed for dry eyes.  . potassium chloride SA (K-DUR) 20 MEQ tablet Take 20 mEq by mouth every Monday, Wednesday, and Friday.  . potassium chloride SA (K-DUR,KLOR-CON) 20 MEQ tablet Take 40 mEq by mouth daily. Take 2 tablets to = 40 mEq qd  . torsemide (DEMADEX) 20 MG tablet Take 20 mg by mouth daily.   No facility-administered encounter medications on file as of 02/15/2019.     Review of Systems  General she is not complaining of any fever or chills.  Skin does not complain of rashes or itching.  Head ears eyes nose mouth and throat does not complain of visual changes or sore throat.  Respiratory does not complain of being short of breath or having a cough.  Cardiac does not complain of chest pain feels she is having some increased lower extremity edema.  GI does complain at times of diarrhea-says she has some cramping pain over the weekend but has not really had that today.  GU does not complain of dysuria.  Musculoskeletal  recently was treated for gout most prominently of her right great toe currently this has improved some.  Does complain of occasional leg pain.  Neurologic does not complain of dizziness headache- or syncope.  Says at times she feels a little lightheaded during therapy she attributes to her low blood pressure.  Psych does have some history of anxiety she does have PRN Ativan for short course does not complain of overt depression she still is mourning the loss of her husband approximately a year ago.    Physical exam.  She is afebrile pulse of 92 respirations of 18 blood pressure 120/58  In general a  pleasant elderly female in no distress lying comfortably in bed.  Her skin is warm and dry.  His visual acuity appears grossly intact sclera and conjunctive are clear.  Oropharynx is clear mucous membranes moist.  Chest is clear to auscultation there is no labored breathing.  Heart is regular rate and rhythm without murmur gallop or rub she has some mild lower extremity edema she believes this is somewhat increased from her baseline   Abdomen is soft not acutely tender but she says this feels somewhat generally sore- bowel sounds are active  Musculoskeletal does move all extremities x4 it appears at baseline limited exam since she is in bed cannot really appreciate any erythema or edema of the right great toe there is minimal tenderness to palpation.  Neurologic is grossly intact her speech is clear could not really appreciate lateralizing findings.  Psych she is alert and oriented pleasant and appropriate.  Labs.  February 18, 2019.  Sodium 137 potassium 4.6 BUN 33.7 creatinine 1.15.  February 11, 2019.  Sodium 141 potassium 4.1 BUN 23.1 creatinine 1.09.  February 05, 2019.  WBC 10.3 hemoglobin 10.6 platelets 426.  At that time sodium was 140 potassium 5.6 BUN 56.3 and creatinine 1.68.  Assessment plan.  1.  History of weight gain with history of diastolic CHF-she appears to have  some variable weights will monitor her weights daily for a week and notify provider of any weight gain.  I have also sent a message to cardiology for their input on this and will await their response.  At this point with her history of hypotension would be hesitant to increase her diuretic significantly for concerns that would aggravate the issue.  Clinically she appears to be stable without complaints of chest pain or shortness of breath-her edema will need to be watched closely as well as her weights.  And again will await cardiology input.  2.  Diarrhea will order a C. difficile cultures apparently this has improved today but would out of caution like to obtain a C. difficile and continue to monitor --her electrolytes appear to be stable which is reassuring.  CPT-99 [id number].  Addendum-February 19, 2019- Richardson Dopp physician assistant with cardiology has responded and recommended keeping Demadex at current dose for now and monitor weights closely and consider increasing Demadex short-term if she gains greater than 3 pounds-.  Also recommended possibly holding Demadex for a day because of her creatinine going up somewhat.  At this point will follow recommendations which are greatly appreciated.  Blood pressures also will need to be monitored again challenging situation with her hypotension as well as at times tachycardia which the Lopressor appears to help with

## 2019-02-19 ENCOUNTER — Encounter: Payer: Self-pay | Admitting: Internal Medicine

## 2019-02-20 ENCOUNTER — Other Ambulatory Visit: Payer: Self-pay

## 2019-02-20 ENCOUNTER — Inpatient Hospital Stay: Payer: Medicare Other

## 2019-02-20 ENCOUNTER — Other Ambulatory Visit: Payer: Self-pay | Admitting: *Deleted

## 2019-02-20 ENCOUNTER — Inpatient Hospital Stay: Payer: Medicare Other | Attending: Hematology | Admitting: Hematology

## 2019-02-20 VITALS — BP 97/55 | HR 98 | Temp 98.4°F | Resp 18 | Ht 62.0 in | Wt 210.2 lb

## 2019-02-20 DIAGNOSIS — Z87891 Personal history of nicotine dependence: Secondary | ICD-10-CM | POA: Diagnosis not present

## 2019-02-20 DIAGNOSIS — E119 Type 2 diabetes mellitus without complications: Secondary | ICD-10-CM | POA: Diagnosis not present

## 2019-02-20 DIAGNOSIS — E538 Deficiency of other specified B group vitamins: Secondary | ICD-10-CM | POA: Insufficient documentation

## 2019-02-20 DIAGNOSIS — D649 Anemia, unspecified: Secondary | ICD-10-CM | POA: Insufficient documentation

## 2019-02-20 DIAGNOSIS — Z79899 Other long term (current) drug therapy: Secondary | ICD-10-CM | POA: Insufficient documentation

## 2019-02-20 DIAGNOSIS — K219 Gastro-esophageal reflux disease without esophagitis: Secondary | ICD-10-CM | POA: Insufficient documentation

## 2019-02-20 DIAGNOSIS — M1990 Unspecified osteoarthritis, unspecified site: Secondary | ICD-10-CM | POA: Insufficient documentation

## 2019-02-20 DIAGNOSIS — J45909 Unspecified asthma, uncomplicated: Secondary | ICD-10-CM | POA: Diagnosis not present

## 2019-02-20 DIAGNOSIS — E039 Hypothyroidism, unspecified: Secondary | ICD-10-CM | POA: Insufficient documentation

## 2019-02-20 DIAGNOSIS — W109XXD Fall (on) (from) unspecified stairs and steps, subsequent encounter: Secondary | ICD-10-CM | POA: Insufficient documentation

## 2019-02-20 DIAGNOSIS — D509 Iron deficiency anemia, unspecified: Secondary | ICD-10-CM

## 2019-02-20 DIAGNOSIS — Z809 Family history of malignant neoplasm, unspecified: Secondary | ICD-10-CM | POA: Diagnosis not present

## 2019-02-20 DIAGNOSIS — E785 Hyperlipidemia, unspecified: Secondary | ICD-10-CM | POA: Diagnosis not present

## 2019-02-20 DIAGNOSIS — S82899D Other fracture of unspecified lower leg, subsequent encounter for closed fracture with routine healing: Secondary | ICD-10-CM | POA: Diagnosis not present

## 2019-02-20 DIAGNOSIS — I10 Essential (primary) hypertension: Secondary | ICD-10-CM | POA: Diagnosis not present

## 2019-02-20 DIAGNOSIS — E669 Obesity, unspecified: Secondary | ICD-10-CM | POA: Insufficient documentation

## 2019-02-20 DIAGNOSIS — F329 Major depressive disorder, single episode, unspecified: Secondary | ICD-10-CM | POA: Diagnosis not present

## 2019-02-20 DIAGNOSIS — G4730 Sleep apnea, unspecified: Secondary | ICD-10-CM | POA: Insufficient documentation

## 2019-02-20 LAB — CBC WITH DIFFERENTIAL (CANCER CENTER ONLY)
Abs Immature Granulocytes: 0.03 10*3/uL (ref 0.00–0.07)
Basophils Absolute: 0 10*3/uL (ref 0.0–0.1)
Basophils Relative: 1 %
Eosinophils Absolute: 0.1 10*3/uL (ref 0.0–0.5)
Eosinophils Relative: 2 %
HCT: 34.6 % — ABNORMAL LOW (ref 36.0–46.0)
Hemoglobin: 10.7 g/dL — ABNORMAL LOW (ref 12.0–15.0)
Immature Granulocytes: 1 %
Lymphocytes Relative: 22 %
Lymphs Abs: 1.3 10*3/uL (ref 0.7–4.0)
MCH: 26.8 pg (ref 26.0–34.0)
MCHC: 30.9 g/dL (ref 30.0–36.0)
MCV: 86.7 fL (ref 80.0–100.0)
Monocytes Absolute: 0.4 10*3/uL (ref 0.1–1.0)
Monocytes Relative: 6 %
Neutro Abs: 4.1 10*3/uL (ref 1.7–7.7)
Neutrophils Relative %: 68 %
Platelet Count: 269 10*3/uL (ref 150–400)
RBC: 3.99 MIL/uL (ref 3.87–5.11)
RDW: 17 % — ABNORMAL HIGH (ref 11.5–15.5)
WBC Count: 6 10*3/uL (ref 4.0–10.5)
nRBC: 0 % (ref 0.0–0.2)

## 2019-02-20 LAB — CMP (CANCER CENTER ONLY)
ALT: 27 U/L (ref 0–44)
AST: 45 U/L — ABNORMAL HIGH (ref 15–41)
Albumin: 3 g/dL — ABNORMAL LOW (ref 3.5–5.0)
Alkaline Phosphatase: 83 U/L (ref 38–126)
Anion gap: 10 (ref 5–15)
BUN: 38 mg/dL — ABNORMAL HIGH (ref 8–23)
CO2: 25 mmol/L (ref 22–32)
Calcium: 8.5 mg/dL — ABNORMAL LOW (ref 8.9–10.3)
Chloride: 103 mmol/L (ref 98–111)
Creatinine: 1.26 mg/dL — ABNORMAL HIGH (ref 0.44–1.00)
GFR, Est AFR Am: 49 mL/min — ABNORMAL LOW (ref >60)
GFR, Estimated: 42 mL/min — ABNORMAL LOW (ref >60)
Glucose, Bld: 117 mg/dL — ABNORMAL HIGH (ref 70–99)
Potassium: 5.1 mmol/L (ref 3.5–5.1)
Sodium: 138 mmol/L (ref 135–145)
Total Bilirubin: <0.2 mg/dL — ABNORMAL LOW (ref 0.3–1.2)
Total Protein: 6.6 g/dL (ref 6.5–8.1)

## 2019-02-20 NOTE — Progress Notes (Signed)
[name]    HEMATOLOGY/ONCOLOGY CONSULTATION NOTE  Date of Service: 02/20/2019  Patient Care Team: [name], MD as PCP - General (Family Medicine) Nahser, Wonda Cheng, MD as PCP - Cardiology (Cardiology)  CHIEF COMPLAINTS/PURPOSE OF CONSULTATION:  Normocytic Anemia  HISTORY OF PRESENTING ILLNESS:  Megan [name] is a wonderful 73 y.o. female who has been referred to [name] by Dr .[name], MD  for evaluation and management of normocytic anemia.  Patient has a h/o inflammatory arthritis, hypothyroidism, iron deficiency, HTN, DM2, glaucoma, asthma . Patient had recent labs with her PCP on 08/13/2018 which showed WBC 7.9k, HCT 30.4, MCV 86, platelets of 349k. Nl FT4 levels. Ferritin 32.  Interval History:   Megan [name] returns today for management and evaluation of her anemia. The patient's last visit with [name] was on 09/01/18. The pt reports that she is doing well overall.  The pt reports that she fell down stairs in May 2020 and broke her ankle. She is now in rehab at a SNF. She has hurt her left knee as well. She denies having any open wounds as a result of her fall and denies concerns for bleeding. She notes that she developed a very large bruise on her bottom after sustaining this fall. She is taking tylenol for pain control.  The pt notes that she vomited and developed "an extreme headache" each time that she took the PO Iron replacement. She is taking a daily multivitamin. She notes that she is taking Protonix with her synthroid in the mornings. She denies blood in her stools, black stools, or changes in bowel habits.  Lab results today (02/20/19) of CBC w/diff is as follows: all values are WNL except for HGB at 10.7, HCT at 34.6, RDW at 17.0. 02/20/19 CMP reviewed 02/20/19 Ferritin is 150  On review of systems, pt reports improved leg swelling, recent fracture, stable weight, stable but low appetite, and denies blood in the stools, black stools, nose bleeds, gum bleeds,  blood in the urine, open wounds, and any other symptoms.   MEDICAL HISTORY:  Past Medical History:  Diagnosis Date  . Allergy    bee stings  . Anxiety   . Asthmatic bronchitis   . Broken arm 1957/2008   right  . Cataract    had surgery   . Depression   . Diabetes mellitus without complication ([place])    prediabetes diet controlled  . Diverticulosis   . GERD (gastroesophageal reflux disease)   . Glaucoma    BIL  . Hemorrhoids   . Hx of adenomatous colonic polyps 12/02/2010  . Hyperlipidemia   . Hypertension   . Hypothyroidism 05/2018  . Inflammatory arthritis   . Knee bursitis   . Migraines   . Obesity   . Osteoarthritis   . Sleep apnea     SURGICAL HISTORY: Past Surgical History:  Procedure Laterality Date  . CARPAL TUNNEL RELEASE Left    left wrist  . CATARACT EXTRACTION W/ INTRAOCULAR LENS  IMPLANT, BILATERAL Bilateral    1999 left, 2008 right  . COLONOSCOPY    . EXCISION MORTON'S NEUROMA Left 1978   left foot  . GLAUCOMA SURGERY Bilateral    and laser surgery, 2004 left, 2008 right  . PARTIAL HYSTERECTOMY  1991   Left an ovary  . POLYPECTOMY    . TOENAIL AVULSION Right    big toe    SOCIAL HISTORY: Social History   Socioeconomic History  . Marital status: Married    Spouse name:  Lynnae Sandhoff  . Number of children: 0  . Years of education: Not on file  . Highest education level: Not on file  Occupational History  . Occupation: retired  Scientific laboratory technician  . Financial resource strain: Not on file  . Food insecurity    Worry: Not on file    Inability: Not on file  . Transportation needs    Medical: Not on file    Non-medical: Not on file  Tobacco Use  . Smoking status: Former Smoker    Types: Cigarettes    Quit date: 08/30/1971    Years since quitting: 47.5  . Smokeless tobacco: Never Used  Substance and Sexual Activity  . Alcohol use: No    Alcohol/week: 0.0 standard drinks  . Drug use: No  . Sexual activity: Not on file  Lifestyle  . Physical  activity    Days per week: Not on file    Minutes per session: Not on file  . Stress: Not on file  Relationships  . Social Herbalist on phone: Not on file    Gets together: Not on file    Attends religious service: Not on file    Active member of club or organization: Not on file    Attends meetings of clubs or organizations: Not on file    Relationship status: Not on file  . Intimate partner violence    Fear of current or ex partner: Not on file    Emotionally abused: Not on file    Physically abused: Not on file    Forced sexual activity: Not on file  Other Topics Concern  . Not on file  Social History Narrative   She is married no children retired.  Lives with husband in a one story home.  Retired from Geologist, engineering for [employer].    FAMILY HISTORY: Family History  Problem Relation Age of Onset  . Colon polyps Maternal Grandfather   . Bone cancer Maternal Grandfather   . Hypertension Mother   . Diabetes Mother   . Hypertension Father   . Diabetes Father   . Asthma Brother   . Obesity Other   . Diabetes Sister        x 2  . Diabetes Brother        prediabetes  . Rectal cancer Neg Hx   . Stomach cancer Neg Hx   . Esophageal cancer Neg Hx     ALLERGIES:  is allergic to aspirin; bee pollen; codeine; fish oil; sulfa antibiotics; camphor; camphor; lisinopril; penicillins; sulfasalazine; and vicodin [hydrocodone-acetaminophen].  MEDICATIONS:  Current Outpatient Medications  Medication Sig Dispense Refill  . Acetaminophen (TYLENOL ARTHRITIS PAIN PO) Take 650 mg by mouth every 6 (six) hours as needed (pain).     [name] allopurinol (ZYLOPRIM) 100 MG tablet Take 1 tablet (100 mg total) by mouth daily. 30 tablet 0  . diclofenac sodium (VOLTAREN) 1 % GEL Apply 2 g topically. Apply to toes and right foot    . levothyroxine (SYNTHROID) 50 MCG tablet Take 1 tablet (50 mcg total) by mouth daily before breakfast. 30 tablet 0  . lisinopril (ZESTRIL) 40 MG tablet Take 40 mg by  mouth daily.    [name] LORazepam (ATIVAN) 0.5 MG tablet Take 1 tablet (0.5 mg total) by mouth daily as needed for up to 14 days for anxiety. 14 tablet 0  . metFORMIN (GLUCOPHAGE) 500 MG tablet Take 1 tablet (500 mg total) by mouth 2 (two) times daily with a meal. 60  tablet 0  . metoprolol tartrate (LOPRESSOR) 25 MG tablet Take 0.5 tablets (12.5 mg total) by mouth 2 (two) times daily. Take 0.5 tablet to = 12.5 mg if SBP >110 60 tablet 0  . Multiple Vitamin (MULTIVITAMIN WITH MINERALS) TABS tablet Take 1 tablet by mouth daily.     . Nutritional Supplement LIQD Take 90 mLs by mouth daily. Med Pass    . ondansetron (ZOFRAN) 4 MG tablet Take 1 tablet (4 mg total) by mouth every 6 (six) hours as needed for nausea or vomiting. GIVE 1 TABLET BY MOUTH EVERY 6 HOURS AS NEEDED FOR NAUSEA/VOMITING 20 tablet 0  . ONETOUCH VERIO test strip USE TO TEST BLOOD GLUCOSE ONCE DAILY    . pantoprazole (PROTONIX) 40 MG tablet Take 1 tablet (40 mg total) by mouth daily. 30 tablet 0  . polyvinyl alcohol (LIQUIFILM TEARS) 1.4 % ophthalmic solution Place 1 drop into both eyes every 4 (four) hours as needed for dry eyes.    . potassium chloride SA (K-DUR) 20 MEQ tablet Take 1 tablet (20 mEq total) by mouth every Monday, Wednesday, and Friday. 12 tablet 0  . torsemide (DEMADEX) 20 MG tablet Take 1 tablet (20 mg total) by mouth daily. 30 tablet 0   No current facility-administered medications for this visit.     REVIEW OF SYSTEMS:    A 10+ POINT REVIEW OF SYSTEMS WAS OBTAINED including neurology, dermatology, psychiatry, cardiac, respiratory, lymph, extremities, GI, GU, Musculoskeletal, constitutional, breasts, reproductive, HEENT.  All pertinent positives are noted in the HPI.  All others are negative.  PHYSICAL EXAMINATION: ECOG PERFORMANCE STATUS: 2 - Symptomatic, <50% confined to bed  Vitals:   02/20/19 1523  BP: (!) 97/55  Pulse: 98  Resp: 18  Temp: 98.4 F (36.9 C)  SpO2: 100%   Filed Weights   02/20/19 1523   Weight: 210 lb 3.2 oz (95.3 kg)   .Body mass index is 38.45 kg/m.  GENERAL:alert, in no acute distress and comfortable SKIN: no acute rashes, no significant lesions EYES: conjunctiva are pink and non-injected, sclera anicteric OROPHARYNX: MMM, no exudates, no oropharyngeal erythema or ulceration NECK: supple, no JVD LYMPH:  no palpable lymphadenopathy in the cervical, axillary or inguinal regions LUNGS: clear to auscultation b/l with normal respiratory effort HEART: regular rate & rhythm ABDOMEN:  normoactive bowel sounds , non tender, not distended. No palpable hepatosplenomegaly.  Extremity: 1+ pedal edema. Changes of chronic venous stasis. PSYCH: alert & oriented x 3 with fluent speech NEURO: no focal motor/sensory deficits   LABORATORY DATA:  I have reviewed the data as listed  . CBC Latest Ref Rng & Units 02/20/2019 11/28/2018 09/14/2018  WBC 4.0 - 10.5 K/uL 6.0 9.4 7.1  Hemoglobin 12.0 - 15.0 g/dL 10.7(L) 11.4 8.9(L)  Hematocrit 36.0 - 46.0 % 34.6(L) 35.5 29.4(L)  Platelets 150 - 400 K/uL 269 401 258   . CMP Latest Ref Rng & Units 02/20/2019 02/11/2019 02/07/2019  Glucose 70 - 99 mg/dL 117(H) - -  BUN 8 - 23 mg/dL 38(H) 23(A) 44(A)  Creatinine 0.44 - 1.00 mg/dL 1.26(H) 1.1 1.2(A)  Sodium 135 - 145 mmol/L 138 141 140  Potassium 3.5 - 5.1 mmol/L 5.1 4.1 4.6  Chloride 98 - 111 mmol/L 103 - -  CO2 22 - 32 mmol/L 25 - -  Calcium 8.9 - 10.3 mg/dL 8.5(L) - -  Total Protein 6.5 - 8.1 g/dL 6.6 - -  Total Bilirubin 0.3 - 1.2 mg/dL <0.2(L) - -  Alkaline Phos 38 - 126  U/L 83 - -  AST 15 - 41 U/L 45(H) - -  ALT 0 - 44 U/L 27 - -   . Lab Results  Component Value Date   IRON 24 (L) 02/20/2019   TIBC 215 (L) 02/20/2019   IRONPCTSAT 11 (L) 02/20/2019   (Iron and TIBC)  Lab Results  Component Value Date   FERRITIN 150 02/20/2019    RADIOGRAPHIC STUDIES: I have personally reviewed the radiological images as listed and agreed with the findings in the report. No results found.   ASSESSMENT & PLAN:   73 y.o. female with   1) Normocytic Anemia Likely multifactorial.   Primarily anemia of chronic disease from b/l leg swelling and chronic venous stasis dermatitis. - patient recommended to f/u with PCP to optimize mx of her pedal edema and venous stasis dermatitis and wound care considerations. Some iron deficiency anemia Normal LDH - no evidence of hemolysis 09/04/18 Sed rate at 66 myeloma panel - no evidence of M spike  PLAN: -Discussed pt labwork today, 02/20/19; HGB at 10.7 -02/20/19 Ferritin is 150. 09/04/18 ferritin at 33 -As pt has not been able to tolerate PO iron replacement, will replace iron IV as necessary -Goal for ferritin >100 -Discussed again that her anemia related to multiple factors including inflammation from venous stasis dermatitis, iron deficiency, and Vitamin B12 deficiency. Her baseline may be in 10-11 range. -Discussed that thyroid replacement should be taken alone, with only water, at least one hour prior to any food. -Vitamin B12 deficiency 09/04/18 levels were at 148 -  -Again recommended that pt start on SL 1087mcg Vitamin B12 daily to optimize B12 levels >400. -Will see the pt back in 4 months    RTC with Dr Irene Limbo with labs in 4 months   Orders Placed This Encounter  Procedures  . CBC with Differential/Platelet    Standing Status:   Future    Standing Expiration Date:   03/26/2020  . CMP ([place] only)    Standing Status:   Future    Standing Expiration Date:   02/20/2020  . Ferritin    Standing Status:   Future    Standing Expiration Date:   02/20/2020  . Iron and TIBC    Standing Status:   Future    Standing Expiration Date:   02/20/2020  . Vitamin B12    Standing Status:   Future    Standing Expiration Date:   02/20/2020    All of the patients questions were answered with apparent satisfaction. The patient knows to call the clinic with any problems, questions or concerns.  The total time spent in the appt was 25 minutes  and more than 50% was on counseling and direct patient cares.    Sullivan Lone MD MS AAHIVMS [facility] Hematology/Oncology Physician [facility]  (Office):       [phone] (Work cell):  [phone] (Fax):           [phone]  02/20/2019 3:43 PM  I, Baldwin [place], am acting as a scribe for Dr. Sullivan Lone.   .I have reviewed the above documentation for accuracy and completeness, and I agree with the above. Brunetta Genera MD

## 2019-02-21 ENCOUNTER — Telehealth: Payer: Self-pay | Admitting: Hematology

## 2019-02-21 ENCOUNTER — Other Ambulatory Visit: Payer: Self-pay | Admitting: Adult Health

## 2019-02-21 DIAGNOSIS — I110 Hypertensive heart disease with heart failure: Secondary | ICD-10-CM

## 2019-02-21 DIAGNOSIS — M1A9XX0 Chronic gout, unspecified, without tophus (tophi): Secondary | ICD-10-CM

## 2019-02-21 DIAGNOSIS — E876 Hypokalemia: Secondary | ICD-10-CM

## 2019-02-21 DIAGNOSIS — K219 Gastro-esophageal reflux disease without esophagitis: Secondary | ICD-10-CM

## 2019-02-21 LAB — IRON AND TIBC
Iron: 24 ug/dL — ABNORMAL LOW (ref 41–142)
Saturation Ratios: 11 % — ABNORMAL LOW (ref 21–57)
TIBC: 215 ug/dL — ABNORMAL LOW (ref 236–444)
UIBC: 191 ug/dL (ref 120–384)

## 2019-02-21 LAB — FERRITIN: Ferritin: 150 ng/mL (ref 11–307)

## 2019-02-21 NOTE — Telephone Encounter (Signed)
Scheduled appt per 6/24 los. Spoke with patient and she is aware of her appt date and time. °

## 2019-02-27 ENCOUNTER — Non-Acute Institutional Stay (SKILLED_NURSING_FACILITY): Payer: Medicare Other | Admitting: Adult Health

## 2019-02-27 ENCOUNTER — Encounter: Payer: Self-pay | Admitting: Adult Health

## 2019-02-27 DIAGNOSIS — M62838 Other muscle spasm: Secondary | ICD-10-CM

## 2019-02-27 DIAGNOSIS — F419 Anxiety disorder, unspecified: Secondary | ICD-10-CM | POA: Diagnosis not present

## 2019-02-27 DIAGNOSIS — M1712 Unilateral primary osteoarthritis, left knee: Secondary | ICD-10-CM

## 2019-02-27 NOTE — Progress Notes (Signed)
Location:  [facility] Room Number: 119-A Place of Service:  SNF (31) Provider:  Durenda Age, DNP, FNP-BC  Patient Care Team: [name], MD as PCP - General (Family Medicine) Nahser, Wonda Cheng, MD as PCP - Cardiology (Cardiology)  Extended Emergency Contact Information Primary Emergency Contact: [place], [place] Phone: [phone] Mobile Phone: [phone] Relation: Sister  Code Status:  Full Code  Goals of care: Advanced Directive information Advanced Directives 01/29/2019  Does Patient Have a Medical Advance Directive? Yes  Type of Advance Directive (No Data)  Does patient want to make changes to medical advance directive? -  Would patient like information on creating a medical advance directive? -     Chief Complaint  Patient presents with  . Acute Visit    Patient requesting to be seen for bilateral lower extremity edema.    HPI:  Pt is a 73 y.o. female seen today for an acute visit due to complaints of BLE edema. She is a short-term rehabilitation resident of [facility]. She has a PMH of anxiety, depression, DM without complications, glaucoma, HLD, HTN, and hypothyroidism. She was seen in her room today. She has trace edema of RLE. She is currently taking Demadex 20 mg daily for the edema. She complains of her left knee hurting, 4/10. She said that she has sat on her wheelchair yesterday for 1-2 hours and now she is experiencing spasm on her left thigh. She was noted to be teary-eyed and explaining that her husband's 1-year death anniversary is during this time. She has requested an extension of her Ativan PRN.  She was supposed to have been discharged 2 weeks ago but she appealed and insurance approved her of extended stay so she can have more therapy.  She has been admitted to the [facility] on 01/11/2019 from a recent hospitalization for a minor right ankle fracture and knee pain  sustained from a fall at her sister's house. She does not wear a CAM boot anymore.  Past Medical History:  Diagnosis Date  . Allergy    bee stings  . Anxiety   . Asthmatic bronchitis   . Broken arm 1957/2008   right  . Cataract    had surgery   . Depression   . Diabetes mellitus without complication ([place])    prediabetes diet controlled  . Diverticulosis   . GERD (gastroesophageal reflux disease)   . Glaucoma    BIL  . Hemorrhoids   . Hx of adenomatous colonic polyps 12/02/2010  . Hyperlipidemia   . Hypertension   . Hypothyroidism 05/2018  . Inflammatory arthritis   . Knee bursitis   . Migraines   . Obesity   . Osteoarthritis   . Sleep apnea    Past Surgical History:  Procedure Laterality Date  . CARPAL TUNNEL RELEASE Left    left wrist  . CATARACT EXTRACTION W/ INTRAOCULAR LENS  IMPLANT, BILATERAL Bilateral    1999 left, 2008 right  . COLONOSCOPY    . EXCISION MORTON'S NEUROMA Left 1978   left foot  . GLAUCOMA SURGERY Bilateral    and laser surgery, 2004 left, 2008 right  . PARTIAL HYSTERECTOMY  1991   Left an ovary  . POLYPECTOMY    . TOENAIL AVULSION Right    big toe    Allergies  Allergen Reactions  . Aspirin Anaphylaxis  . Bee Pollen     Extreme swelling due to bee stings  . Codeine Nausea And Vomiting  .  Fish Oil Diarrhea  . Sulfa Antibiotics Nausea And Vomiting  . Camphor Dermatitis  . Camphor Dermatitis  . Lisinopril Cough  . Penicillins Rash    DID THE REACTION INVOLVE: Swelling of the face/tongue/throat, SOB, or low BP? Yes Sudden or severe rash/hives, skin peeling, or the inside of the mouth or nose? Unknown Did it require medical treatment? Unknown When did it last happen? If all above answers are "NO", may proceed with cephalosporin use.   . Sulfasalazine Nausea And Vomiting  . Vicodin [Hydrocodone-Acetaminophen] Nausea And Vomiting    Outpatient Encounter Medications as of 02/27/2019  Medication Sig  . Acetaminophen (TYLENOL  ARTHRITIS PAIN PO) Take 650 mg by mouth every 6 (six) hours as needed (pain).   [name] allopurinol (ZYLOPRIM) 100 MG tablet Take 1 tablet (100 mg total) by mouth daily.  . diclofenac sodium (VOLTAREN) 1 % GEL Apply 2 g topically. Apply to toes and right foot  . levothyroxine (SYNTHROID) 50 MCG tablet Take 1 tablet (50 mcg total) by mouth daily before breakfast.  . LORazepam (ATIVAN) 0.5 MG tablet Take 1 tablet (0.5 mg total) by mouth daily as needed for up to 14 days for anxiety.  . metFORMIN (GLUCOPHAGE) 500 MG tablet Take 1 tablet (500 mg total) by mouth 2 (two) times daily with a meal.  . metoprolol tartrate (LOPRESSOR) 25 MG tablet Take 0.5 tablets (12.5 mg total) by mouth 2 (two) times daily. Take 0.5 tablet to = 12.5 mg if SBP >110  . Multiple Vitamin (MULTIVITAMIN WITH MINERALS) TABS tablet Take 1 tablet by mouth daily.   . Nutritional Supplement LIQD Take 90 mLs by mouth daily. Med Pass  . ondansetron (ZOFRAN) 4 MG tablet Take 1 tablet (4 mg total) by mouth every 6 (six) hours as needed for nausea or vomiting. GIVE 1 TABLET BY MOUTH EVERY 6 HOURS AS NEEDED FOR NAUSEA/VOMITING  . pantoprazole (PROTONIX) 40 MG tablet Take 1 tablet (40 mg total) by mouth daily.  . polyvinyl alcohol (LIQUIFILM TEARS) 1.4 % ophthalmic solution Place 1 drop into both eyes every 4 (four) hours as needed for dry eyes.  . potassium chloride SA (K-DUR) 20 MEQ tablet Take 1 tablet (20 mEq total) by mouth every Monday, Wednesday, and Friday.  . torsemide (DEMADEX) 20 MG tablet Take 1 tablet (20 mg total) by mouth daily.  Glory Rosebush VERIO test strip USE TO TEST BLOOD GLUCOSE ONCE DAILY  . [DISCONTINUED] lisinopril (ZESTRIL) 40 MG tablet Take 40 mg by mouth daily.   No facility-administered encounter medications on file as of 02/27/2019.     Review of Systems  GENERAL: No change in appetite, no fatigue, no weight changes, no fever, chills or weakness MOUTH and THROAT: Denies oral discomfort, gingival pain or bleeding  RESPIRATORY: no cough, SOB, DOE, wheezing, hemoptysis CARDIAC: No chest pain, or palpitations GI: No abdominal pain, diarrhea, constipation, heart burn, nausea or vomiting GU: Denies dysuria, frequency, hematuria, incontinence, or discharge NEUROLOGICAL: Denies dizziness, syncope, numbness, or headache PSYCHIATRIC: Denies feelings of depression or anxiety. No report of hallucinations, insomnia, paranoia, or agitation    Immunization History  Administered Date(s) Administered  . Pneumococcal Conjugate-13 02/12/2014  . Tdap 12/31/2008   Pertinent  Health Maintenance Due  Topic Date Due  . OPHTHALMOLOGY EXAM  10/22/1955  . MAMMOGRAM  10/22/1995  . DEXA SCAN  10/21/2010  . PNA vac Low Risk Adult (2 of 2 - PPSV23) 02/13/2015  . HEMOGLOBIN A1C  03/10/2019  . INFLUENZA VACCINE  03/30/2019  . FOOT  EXAM  06/20/2019  . COLONOSCOPY  06/01/2023   Fall Risk  10/30/2018 07/11/2016 01/07/2016  Falls in the past year? 0 No No     Vitals:   02/27/19 1117  BP: 102/64  Pulse: 97  Resp: 16  Temp: 98 F (36.7 C)  TempSrc: Oral  Weight: 199 lb 6.4 oz (90.4 kg)  Height: 5\' 2"  (1.575 m)   Body mass index is 36.47 kg/m.  Physical Exam  GENERAL APPEARANCE: Well nourished. In no acute distress. Obese SKIN:  Skin is warm and dry.  MOUTH and THROAT: Lips are without lesions. Oral mucosa is moist and without lesions. Tongue is normal in shape, size, and color and without lesions RESPIRATORY: Breathing is even & unlabored, BS CTAB CARDIAC: RRR, no murmur,no extra heart sounds, RLE trace edema GI: Abdomen soft, normal BS, no masses, no tenderness EXTREMITIES:  Able to move X 4 extremities NEUROLOGICAL: There is no tremor. Speech is clear. Alert and oriented X 3.   PSYCHIATRIC: Affect and behavior are appropriate.   Labs reviewed: Recent Labs    09/13/18 0447 09/14/18 0403 09/15/18 0502 09/15/18 0834 09/16/18 0359 09/17/18 0418  11/05/18 1028 11/28/18 0944 02/07/19 02/11/19 02/20/19  1442  NA 141 139 138  --  140 140   < > 145* 144 140 141 138  K 3.3* 3.0* 3.2*  --  3.5 3.7  --  4.5 4.0 4.6 4.1 5.1  CL 101 96* 94*  --  96* 94*  --  102 99  --   --  103  CO2 29 31 33*  --  34* 35*  --  23 25  --   --  25  GLUCOSE 179* 180* 182*  --  177* 204*  --  113* 119*  --   --  117*  BUN 51* 52* 57*  --  55* 57*   < > 29* 25 44* 23* 38*  CREATININE 1.22* 1.15* 1.40*  --  1.19* 1.22*  --  1.29* 1.17* 1.2* 1.1 1.26*  CALCIUM 8.3* 8.3* 8.0*  --  8.3* 8.4*  --  8.1* 7.7*  --   --  8.5*  MG 1.8 1.9  --  1.9  --   --   --   --   --   --   --   --   PHOS  --   --  4.1  --  3.5 3.6  --   --   --   --   --   --    < > = values in this interval not displayed.   Recent Labs    09/08/18 1609 09/09/18 0824  09/16/18 0359 09/17/18 0418 02/20/19 1442  AST 15 15  --   --   --  45*  ALT 10 8  --   --   --  27  ALKPHOS 77 63  --   --   --  83  BILITOT 0.8 0.2*  --   --   --  <0.2*  PROT 6.4* 5.7*  --   --   --  6.6  ALBUMIN 3.2* 2.7*   < > 2.9* 3.1* 3.0*   < > = values in this interval not displayed.   Recent Labs    09/13/18 0447 09/14/18 0403 11/28/18 0944 02/20/19 1442  WBC 7.4 7.1 9.4 6.0  NEUTROABS 4.9 4.8  --  4.1  HGB 9.1* 8.9* 11.4 10.7*  HCT 30.4* 29.4* 35.5 34.6*  MCV 91.0 91.0 84  86.7  PLT 285 258 401 269   Lab Results  Component Value Date   TSH 3.265 09/09/2018   Lab Results  Component Value Date   HGBA1C 7.1 (H) 09/09/2018    Assessment/Plan  1. Muscle Spasm   - will start on Robaxin 500 mg 1 tab twice a day as needed, fall precautions   2.  Osteoarthritis knees of left knee -Will start on Voltaren gel 1% applied topically to left knee 4 g twice a day  3.  Anxiety -Continue will continue Ativan 0.5 mg 1 tab daily as needed    Family/ staff Communication: Discussed plan of care with resident.  Labs/tests ordered: None  Goals of care:   Short-term rehabilitation.   Durenda Age, DNP, FNP-BC [facility]  [phone] (Monday-Friday 8:00 a.m. - 5:00 p.m.) [phone] (after hours)

## 2019-02-28 ENCOUNTER — Encounter: Payer: Self-pay | Admitting: Adult Health

## 2019-02-28 ENCOUNTER — Other Ambulatory Visit: Payer: Self-pay | Admitting: Adult Health

## 2019-02-28 DIAGNOSIS — M1712 Unilateral primary osteoarthritis, left knee: Secondary | ICD-10-CM | POA: Insufficient documentation

## 2019-02-28 DIAGNOSIS — E876 Hypokalemia: Secondary | ICD-10-CM

## 2019-02-28 DIAGNOSIS — F419 Anxiety disorder, unspecified: Secondary | ICD-10-CM

## 2019-02-28 DIAGNOSIS — K219 Gastro-esophageal reflux disease without esophagitis: Secondary | ICD-10-CM

## 2019-02-28 DIAGNOSIS — M62838 Other muscle spasm: Secondary | ICD-10-CM | POA: Insufficient documentation

## 2019-02-28 DIAGNOSIS — E118 Type 2 diabetes mellitus with unspecified complications: Secondary | ICD-10-CM

## 2019-02-28 DIAGNOSIS — M1A9XX0 Chronic gout, unspecified, without tophus (tophi): Secondary | ICD-10-CM

## 2019-02-28 DIAGNOSIS — I110 Hypertensive heart disease with heart failure: Secondary | ICD-10-CM

## 2019-02-28 DIAGNOSIS — E034 Atrophy of thyroid (acquired): Secondary | ICD-10-CM

## 2019-02-28 MED ORDER — LORAZEPAM 0.5 MG PO TABS: 0.5000 mg | ORAL_TABLET | Freq: Every day | ORAL | 0 refills | Status: AC | PRN

## 2019-02-28 NOTE — Progress Notes (Signed)
Location:  [place] Room Number: 119-A Place of Service:  SNF (31) Provider:  Durenda Age, DNP, FNP-BC  Patient Care Team: [name], MD as PCP - General (Family Medicine) Nahser, Wonda Cheng, MD as PCP - Cardiology (Cardiology)  Extended Emergency Contact Information Primary Emergency Contact: [place], [place] Phone: [phone] Mobile Phone: [phone] Relation: Sister  Code Status:  Full Code  Goals of care: Advanced Directive information Advanced Directives 01/29/2019  Does Patient Have a Medical Advance Directive? Yes  Type of Advance Directive (No Data)  Does patient want to make changes to medical advance directive? -  Would patient like information on creating a medical advance directive? -     Chief Complaint  Patient presents with  . Discharge Note    Patient is seen for discharge home on 03/04/19.  She was seen previously for discharge, but appealed and did not discharge at that time.     HPI:  Pt is a 73 y.o. female seen today for discharge. She is scheduled to discharge on 03/04/19 with home health OT, PT, and Nursing services.      She has a PMH of DM without complications, depression, anxiety, glaucoma, HLD, HTN, and hypothyroidism.    Past Medical History:  Diagnosis Date  . Allergy    bee stings  . Anxiety   . Asthmatic bronchitis   . Broken arm 1957/2008   right  . Cataract    had surgery   . Depression   . Diabetes mellitus without complication ([place])    prediabetes diet controlled  . Diverticulosis   . GERD (gastroesophageal reflux disease)   . Glaucoma    BIL  . Hemorrhoids   . Hx of adenomatous colonic polyps 12/02/2010  . Hyperlipidemia   . Hypertension   . Hypothyroidism 05/2018  . Inflammatory arthritis   . Knee bursitis   . Migraines   . Obesity   . Osteoarthritis   . Sleep apnea    Past Surgical History:  Procedure Laterality Date  . CARPAL TUNNEL RELEASE Left    left wrist  .  CATARACT EXTRACTION W/ INTRAOCULAR LENS  IMPLANT, BILATERAL Bilateral    1999 left, 2008 right  . COLONOSCOPY    . EXCISION MORTON'S NEUROMA Left 1978   left foot  . GLAUCOMA SURGERY Bilateral    and laser surgery, 2004 left, 2008 right  . PARTIAL HYSTERECTOMY  1991   Left an ovary  . POLYPECTOMY    . TOENAIL AVULSION Right    big toe    Allergies  Allergen Reactions  . Aspirin Anaphylaxis  . Bee Pollen     Extreme swelling due to bee stings  . Codeine Nausea And Vomiting  . Fish Oil Diarrhea  . Sulfa Antibiotics Nausea And Vomiting  . Camphor Dermatitis  . Camphor Dermatitis  . Lisinopril Cough  . Penicillins Rash    DID THE REACTION INVOLVE: Swelling of the face/tongue/throat, SOB, or low BP? Yes Sudden or severe rash/hives, skin peeling, or the inside of the mouth or nose? Unknown Did it require medical treatment? Unknown When did it last happen? If all above answers are "NO", may proceed with cephalosporin use.   . Sulfasalazine Nausea And Vomiting  . Vicodin [Hydrocodone-Acetaminophen] Nausea And Vomiting    Outpatient Encounter Medications as of 02/28/2019  Medication Sig  . Acetaminophen (TYLENOL ARTHRITIS PAIN PO) Take 650 mg by mouth every 6 (six) hours as needed (pain).   [name] allopurinol (ZYLOPRIM) 100 MG tablet  Take 1 tablet (100 mg total) by mouth daily.  . diclofenac sodium (VOLTAREN) 1 % GEL Apply 2 g topically. Apply to left knee  . levothyroxine (SYNTHROID) 50 MCG tablet Take 1 tablet (50 mcg total) by mouth daily before breakfast.  . LORazepam (ATIVAN) 0.5 MG tablet Take 1 tablet (0.5 mg total) by mouth daily as needed for up to 14 days for anxiety (for transitional fill).  . metFORMIN (GLUCOPHAGE) 500 MG tablet Take 1 tablet (500 mg total) by mouth 2 (two) times daily with a meal.  . methocarbamol (ROBAXIN) 500 MG tablet Take 500 mg by mouth every 12 (twelve) hours as needed for muscle spasms.  . metoprolol tartrate (LOPRESSOR) 25 MG tablet Take 0.5  tablets (12.5 mg total) by mouth 2 (two) times daily. Take 0.5 tablet to = 12.5 mg if SBP >110  . Multiple Vitamin (MULTIVITAMIN WITH MINERALS) TABS tablet Take 1 tablet by mouth daily.   . Nutritional Supplement LIQD Take 90 mLs by mouth daily. Med Pass  . ondansetron (ZOFRAN) 4 MG tablet Take 1 tablet (4 mg total) by mouth every 6 (six) hours as needed for nausea or vomiting. GIVE 1 TABLET BY MOUTH EVERY 6 HOURS AS NEEDED FOR NAUSEA/VOMITING  . pantoprazole (PROTONIX) 40 MG tablet Take 1 tablet (40 mg total) by mouth daily.  . polyvinyl alcohol (LIQUIFILM TEARS) 1.4 % ophthalmic solution Place 1 drop into both eyes every 4 (four) hours as needed for dry eyes.  . potassium chloride SA (K-DUR) 20 MEQ tablet Take 1 tablet (20 mEq total) by mouth every Monday, Wednesday, and Friday.  . torsemide (DEMADEX) 20 MG tablet Take 1 tablet (20 mg total) by mouth daily.  Glory Rosebush VERIO test strip USE TO TEST BLOOD GLUCOSE ONCE DAILY   No facility-administered encounter medications on file as of 02/28/2019.     Review of Systems  GENERAL: No change in appetite, no fatigue, no weight changes, no fever, chills or weakness SKIN: Denies rash, itching, wounds, ulcer sores, or nail abnormalities EYES: Denies change in vision, dry eyes, eye pain, itching or discharge EARS: Denies change in hearing, ringing in ears, or earache NOSE: Denies nasal congestion or epistaxis MOUTH and THROAT: Denies oral discomfort, gingival pain or bleeding, pain from teeth or hoarseness   RESPIRATORY: no cough, SOB, DOE, wheezing, hemoptysis CARDIAC: No chest pain, edema or palpitations GI: No abdominal pain, diarrhea, constipation, heart burn, nausea or vomiting GU: Denies dysuria, frequency, hematuria, incontinence, or discharge MUSCULOSKELETAL: Denies joint pain, muscle pain, back pain, restricted movement, or unusual weakness CIRCULATION: Denies claudication, edema of legs, varicosities, or cold extremities NEUROLOGICAL:  Denies dizziness, syncope, numbness, or headache PSYCHIATRIC: Denies feelings of depression or anxiety. No report of hallucinations, insomnia, paranoia, or agitation ENDOCRINE: Denies polyphagia, polyuria, polydipsia, heat or cold intolerance HEME/LYMPH: Denies excessive bruising, petechia, enlarged lymph nodes, or bleeding problems IMMUNOLOGIC: Denies history of frequent infections, AIDS, or use of immunosuppressive agents   Immunization History  Administered Date(s) Administered  . Pneumococcal Conjugate-13 02/12/2014  . Tdap 12/31/2008   Pertinent  Health Maintenance Due  Topic Date Due  . OPHTHALMOLOGY EXAM  10/22/1955  . URINE MICROALBUMIN  10/22/1955  . MAMMOGRAM  10/22/1995  . DEXA SCAN  10/21/2010  . PNA vac Low Risk Adult (2 of 2 - PPSV23) 02/13/2015  . HEMOGLOBIN A1C  03/10/2019  . INFLUENZA VACCINE  03/30/2019  . FOOT EXAM  06/20/2019  . COLONOSCOPY  06/01/2023   Fall Risk  10/30/2018 07/11/2016 01/07/2016  Falls  in the past year? 0 No No     Vitals:   02/28/19 1347  BP: 122/67  Pulse: 94  Resp: 16  Temp: 98.7 F (37.1 C)  TempSrc: Oral  Weight: 199 lb 6.4 oz (90.4 kg)  Height: 5\' 2"  (1.575 m)   Body mass index is 36.47 kg/m.  Physical Exam  GENERAL APPEARANCE: Well nourished. In no acute distress. Normal body habitus SKIN:  Skin is warm and dry. There are no suspicious lesions or rash HEAD: Normal in size and contour. No evidence of trauma EYES: Lids open and close normally. No blepharitis, entropion or ectropion. PERRL. Conjunctivae are clear and sclerae are white. Lenses are without opacity EARS: Pinnae are normal. Patient hears normal voice tunes of the examiner MOUTH and THROAT: Lips are without lesions. Oral mucosa is moist and without lesions. Tongue is normal in shape, size, and color and without lesions NECK: supple, trachea midline, no neck masses, no thyroid tenderness, no thyromegaly LYMPHATICS: No LAN in the neck, no supraclavicular LAN  RESPIRATORY: Breathing is even & unlabored, BS CTAB CARDIAC: RRR, no murmur,no extra heart sounds, no edema GI: Abdomen soft, normal BS, no masses, no tenderness, no hepatomegaly, no splenomegaly MUSCULOSKELETAL: No deformities. Movement at each extremity is full and painless. Strength is 5/5 at each extremity. Back is without kyphosis or scoliosis CIRCULATION: Pedal pulses are 2+. There is no edema of the legs, ankles and feet NEUROLOGICAL: There is no tremor. Speech is clear PSYCHIATRIC: Alert and oriented X 3. Affect and behavior are appropriate  Labs reviewed: Recent Labs    09/13/18 0447 09/14/18 0403 09/15/18 0502 09/15/18 0834 09/16/18 0359 09/17/18 0418  11/05/18 1028 11/28/18 0944 02/07/19 02/11/19 02/20/19 1442  NA 141 139 138  --  140 140   < > 145* 144 140 141 138  K 3.3* 3.0* 3.2*  --  3.5 3.7  --  4.5 4.0 4.6 4.1 5.1  CL 101 96* 94*  --  96* 94*  --  102 99  --   --  103  CO2 29 31 33*  --  34* 35*  --  23 25  --   --  25  GLUCOSE 179* 180* 182*  --  177* 204*  --  113* 119*  --   --  117*  BUN 51* 52* 57*  --  55* 57*   < > 29* 25 44* 23* 38*  CREATININE 1.22* 1.15* 1.40*  --  1.19* 1.22*  --  1.29* 1.17* 1.2* 1.1 1.26*  CALCIUM 8.3* 8.3* 8.0*  --  8.3* 8.4*  --  8.1* 7.7*  --   --  8.5*  MG 1.8 1.9  --  1.9  --   --   --   --   --   --   --   --   PHOS  --   --  4.1  --  3.5 3.6  --   --   --   --   --   --    < > = values in this interval not displayed.   Recent Labs    09/08/18 1609 09/09/18 0824  09/16/18 0359 09/17/18 0418 02/20/19 1442  AST 15 15  --   --   --  45*  ALT 10 8  --   --   --  27  ALKPHOS 77 63  --   --   --  83  BILITOT 0.8 0.2*  --   --   --  <  0.2*  PROT 6.4* 5.7*  --   --   --  6.6  ALBUMIN 3.2* 2.7*   < > 2.9* 3.1* 3.0*   < > = values in this interval not displayed.   Recent Labs    09/13/18 0447 09/14/18 0403 11/28/18 0944 02/20/19 1442  WBC 7.4 7.1 9.4 6.0  NEUTROABS 4.9 4.8  --  4.1  HGB 9.1* 8.9* 11.4 10.7*  HCT 30.4*  29.4* 35.5 34.6*  MCV 91.0 91.0 84 86.7  PLT 285 258 401 269   Lab Results  Component Value Date   TSH 3.265 09/09/2018   Lab Results  Component Value Date   HGBA1C 7.1 (H) 09/09/2018    Assessment/Plan    Goals of care:   Discharge home.   Durenda Age, DNP, FNP-BC [facility] [phone] (Monday-Friday 8:00 a.m. - 5:00 p.m.) [phone] (after hours)  This encounter was created in error - please disregard.

## 2019-03-01 MED ORDER — POTASSIUM CHLORIDE CRYS ER 20 MEQ PO TBCR: 20.0000 meq | EXTENDED_RELEASE_TABLET | ORAL | 0 refills | Status: DC

## 2019-03-01 MED ORDER — PANTOPRAZOLE SODIUM 40 MG PO TBEC: 40.0000 mg | DELAYED_RELEASE_TABLET | Freq: Every day | ORAL | 0 refills | Status: DC

## 2019-03-01 MED ORDER — TORSEMIDE 20 MG PO TABS: 20.0000 mg | ORAL_TABLET | Freq: Every day | ORAL | 0 refills | Status: DC

## 2019-03-01 MED ORDER — LEVOTHYROXINE SODIUM 50 MCG PO TABS: 50.0000 ug | ORAL_TABLET | Freq: Every day | ORAL | 0 refills | Status: DC

## 2019-03-01 MED ORDER — METOPROLOL TARTRATE 25 MG PO TABS: 12.5000 mg | ORAL_TABLET | Freq: Two times a day (BID) | ORAL | 0 refills | Status: DC

## 2019-03-01 MED ORDER — ALLOPURINOL 100 MG PO TABS: 100.0000 mg | ORAL_TABLET | Freq: Every day | ORAL | 0 refills | Status: DC

## 2019-03-01 MED ORDER — ONDANSETRON HCL 4 MG PO TABS: 4.0000 mg | ORAL_TABLET | Freq: Four times a day (QID) | ORAL | 0 refills | Status: DC | PRN

## 2019-03-01 MED ORDER — METFORMIN HCL 500 MG PO TABS: 500.0000 mg | ORAL_TABLET | Freq: Two times a day (BID) | ORAL | 0 refills | Status: DC

## 2019-03-01 MED ORDER — POLYVINYL ALCOHOL 1.4 % OP SOLN: 1.0000 [drp] | OPHTHALMIC | 0 refills | Status: DC | PRN

## 2019-03-01 MED ORDER — METHOCARBAMOL 500 MG PO TABS: 500.0000 mg | ORAL_TABLET | Freq: Two times a day (BID) | ORAL | 0 refills | Status: DC | PRN

## 2019-03-04 ENCOUNTER — Encounter: Payer: Self-pay | Admitting: Internal Medicine

## 2019-03-04 ENCOUNTER — Non-Acute Institutional Stay (SKILLED_NURSING_FACILITY): Payer: Medicare Other | Admitting: Internal Medicine

## 2019-03-04 DIAGNOSIS — M1A9XX0 Chronic gout, unspecified, without tophus (tophi): Secondary | ICD-10-CM

## 2019-03-04 DIAGNOSIS — E118 Type 2 diabetes mellitus with unspecified complications: Secondary | ICD-10-CM | POA: Diagnosis not present

## 2019-03-04 DIAGNOSIS — M1712 Unilateral primary osteoarthritis, left knee: Secondary | ICD-10-CM

## 2019-03-04 DIAGNOSIS — I9589 Other hypotension: Secondary | ICD-10-CM

## 2019-03-04 DIAGNOSIS — I5032 Chronic diastolic (congestive) heart failure: Secondary | ICD-10-CM | POA: Diagnosis not present

## 2019-03-04 DIAGNOSIS — F419 Anxiety disorder, unspecified: Secondary | ICD-10-CM

## 2019-03-04 NOTE — Progress Notes (Signed)
Location:    [facility] Room Number: 119/A Place of Service:  SNF [phone]) Provider: Granville Lewis PA-C  [name], MD  Patient Care Team: [name], MD as PCP - General (Family Medicine) Nahser, Wonda Cheng, MD as PCP - Cardiology (Cardiology)  Extended Emergency Contact Information Primary Emergency Contact: Heath, [place] Phone: [phone] Mobile Phone: [phone] Relation: Sister  Code Status:  Full Code Goals of care: Advanced Directive information Advanced Directives 03/04/2019  Does Patient Have a Medical Advance Directive? Yes  Type of Advance Directive (No Data)  Does patient want to make changes to medical advance directive? No - Patient declined  Would patient like information on creating a medical advance directive? -     Chief Complaint  Patient presents with  . Medical Management of Chronic Issues    Routine visit of medical management  Medical management of chronic medical conditions including recent mild right ankle fracture- diastolic CHF-type 2 diabetes-recent history of gout- hypertension-hypothyroidism osteoarthritis and anemia as well as anxiety  HPI:  Pt is a 73 y.o. female seen today for medical management of chronic diseases.  As noted above.  Apparently she was slated for discharge after rehab here for mild right ankle fracture but she did when her insurance appeal and remains here for the short-term.  She has numerous medical issues she no longer has a cam walker for the ankle fracture and she is followed by orthopedics appears to be doing relatively well she is progressing with therapy.  She also has a history of chronic edema with a history of diastolic CHF which has been followed closely by cardiology as well.  She is currently on Demadex 20 mg a day this was reduced from a previous higher dose because of stability of her edema as well as some decline in her renal function.  Her last  creatinine was 1.26 which appears to be rising somewhat this will need to be updated.  Clinically she appears to be doing relatively well she continues to complain of some edema she has gained about 4 pounds it appears over the last several days however this appears still to be around her relative baseline which is around 200.    She does not complain of any increased shortness of breath or cough and apparently is doing pretty well with therapy.  She is also seen for anxiety she did lose her husband about a year ago and is still mourning this to some extent- she does have an order for Ativan 0.5 for the short-term.  She appears to need this.  Regards to hypertension she has she has had concerns with hypotension at times appear to be symptomatic with therapy she is now only on Lopressor 12.5 mg twice daily with parameters to hold this systolic blood pressures less than 110 this appears to have improved blood pressures 126/66-113/87-120/64- 145/75-appears on July 4 there is a reading of 96/76 but this appears to be less common.  She also was seen recently by our service's  nurse practitioner for complaints of left knee discomfort and was prescribed Voltaren gel twice a day she says this is helping some.  She also complained of what appeared to be muscle spasms in her thighs started on Robaxin as needed.  She still complains of some tightness and again the Robaxin has just recently been scribed would like to give this a little more time to see if that helps-- somewhat hesitant to order this  routinely secondary to fall risk  In regards to her other issues she does have a history of anemia and she actually saw hematology recently was thought to be multifactorial secondary to inflammation from venous stasis as well as iron deficiency and B12 deficiency-there is an order to start her on B12 and I will write that order today.  Also per hematology she may need iron infusions as needed-her iron level on  June 24 was 4 ferritin was 150-hemoglobin appeared stable at 10.7-.  Apparently she does not tolerate oral iron well.  She will see hematology in about 4 months for follow-up.  Currently she is sitting in her chair comfortably she is pleasant appropriate --vital signs appear to be stable     Past Medical History:  Diagnosis Date  . Allergy    bee stings  . Anxiety   . Asthmatic bronchitis   . Broken arm 1957/2008   right  . Cataract    had surgery   . Depression   . Diabetes mellitus without complication ([place])    prediabetes diet controlled  . Diverticulosis   . GERD (gastroesophageal reflux disease)   . Glaucoma    BIL  . Hemorrhoids   . Hx of adenomatous colonic polyps 12/02/2010  . Hyperlipidemia   . Hypertension   . Hypothyroidism 05/2018  . Inflammatory arthritis   . Knee bursitis   . Migraines   . Obesity   . Osteoarthritis   . Sleep apnea    Past Surgical History:  Procedure Laterality Date  . CARPAL TUNNEL RELEASE Left    left wrist  . CATARACT EXTRACTION W/ INTRAOCULAR LENS  IMPLANT, BILATERAL Bilateral    1999 left, 2008 right  . COLONOSCOPY    . EXCISION MORTON'S NEUROMA Left 1978   left foot  . GLAUCOMA SURGERY Bilateral    and laser surgery, 2004 left, 2008 right  . PARTIAL HYSTERECTOMY  1991   Left an ovary  . POLYPECTOMY    . TOENAIL AVULSION Right    big toe    Allergies  Allergen Reactions  . Aspirin Anaphylaxis  . Bee Pollen     Extreme swelling due to bee stings  . Codeine Nausea And Vomiting  . Fish Oil Diarrhea  . Sulfa Antibiotics Nausea And Vomiting  . Camphor Dermatitis  . Camphor Dermatitis  . Lisinopril Cough  . Penicillins Rash    DID THE REACTION INVOLVE: Swelling of the face/tongue/throat, SOB, or low BP? Yes Sudden or severe rash/hives, skin peeling, or the inside of the mouth or nose? Unknown Did it require medical treatment? Unknown When did it last happen? If all above answers are "NO", may proceed with  cephalosporin use.   . Sulfasalazine Nausea And Vomiting  . Vicodin [Hydrocodone-Acetaminophen] Nausea And Vomiting    Allergies as of 03/04/2019      Reactions   Aspirin Anaphylaxis   Bee Pollen    Extreme swelling due to bee stings   Codeine Nausea And Vomiting   Fish Oil Diarrhea   Sulfa Antibiotics Nausea And Vomiting   Camphor Dermatitis   Camphor Dermatitis   Lisinopril Cough   Penicillins Rash   DID THE REACTION INVOLVE: Swelling of the face/tongue/throat, SOB, or low BP? Yes Sudden or severe rash/hives, skin peeling, or the inside of the mouth or nose? Unknown Did it require medical treatment? Unknown When did it last happen? If all above answers are "NO", may proceed with cephalosporin use.   Sulfasalazine Nausea And Vomiting  Vicodin [hydrocodone-acetaminophen] Nausea And Vomiting      Medication List       Accurate as of March 04, 2019  8:54 AM. If you have any questions, ask your nurse or doctor.        allopurinol 100 MG tablet Commonly known as: ZYLOPRIM Take 1 tablet (100 mg total) by mouth daily.   diclofenac sodium 1 % Gel Commonly known as: VOLTAREN Apply 2 g topically. Apply to left knee   levothyroxine 50 MCG tablet Commonly known as: SYNTHROID Take 1 tablet (50 mcg total) by mouth daily before breakfast.   LORazepam 0.5 MG tablet Commonly known as: Ativan Take 1 tablet (0.5 mg total) by mouth daily as needed for up to 14 days for anxiety (for transitional fill).   metFORMIN 500 MG tablet Commonly known as: GLUCOPHAGE Take 1 tablet (500 mg total) by mouth 2 (two) times daily with a meal.   methocarbamol 500 MG tablet Commonly known as: ROBAXIN Take 1 tablet (500 mg total) by mouth every 12 (twelve) hours as needed for muscle spasms.   metoprolol tartrate 25 MG tablet Commonly known as: LOPRESSOR Take 0.5 tablets (12.5 mg total) by mouth 2 (two) times daily. Take 0.5 tablet to = 12.5 mg if SBP >110   multivitamin with minerals Tabs  tablet Take 1 tablet by mouth daily.   Nutritional Supplement Liqd Take 90 mLs by mouth daily. Med Pass   ondansetron 4 MG tablet Commonly known as: ZOFRAN Take 1 tablet (4 mg total) by mouth every 6 (six) hours as needed for nausea or vomiting. GIVE 1 TABLET BY MOUTH EVERY 6 HOURS AS NEEDED FOR NAUSEA/VOMITING   OneTouch Verio test strip Generic drug: glucose blood USE TO TEST BLOOD GLUCOSE ONCE DAILY   pantoprazole 40 MG tablet Commonly known as: PROTONIX Take 1 tablet (40 mg total) by mouth daily.   polyvinyl alcohol 1.4 % ophthalmic solution Commonly known as: LIQUIFILM TEARS Place 1 drop into both eyes every 4 (four) hours as needed for dry eyes.   potassium chloride SA 20 MEQ tablet Commonly known as: K-DUR Take 1 tablet (20 mEq total) by mouth every Monday, Wednesday, and Friday.   torsemide 20 MG tablet Commonly known as: DEMADEX Take 1 tablet (20 mg total) by mouth daily.   TYLENOL ARTHRITIS PAIN PO Take 650 mg by mouth every 6 (six) hours as needed (pain).       Review of Systems   General she is not complaining of any fever or chills.  Skin does not complain of rashes or itching or diaphoresis.  Head ears eyes nose mouth and throat is not complaining of visual changes or sore throat.  Respiratory does not complain of shortness of breath or cough.  Cardiac does not complain of chest pain continues to have some lower extremity edema but this does not appear to be increased from baseline.  GI does not complain of nausea vomiting diarrhea constipation at one point had some nausea and vomiting earlier in her stay but this appears to have resolved.  GU does not complain of dysuria.  Musculoskeletal does still complain of some muscle tightness in the thighs-also had complained of some arthritic type pain in her left knee which appears to be better with the Voltaren gel.  Neurologic is not currently complaining of dizziness headache or syncope at one point had  some dizziness during therapy when she was on a more aggressive antihypertensive regimen.  And psych does have some history of anxiety  she has benefited apparently from the Ativan as needed.    Immunization History  Administered Date(s) Administered  . Pneumococcal Conjugate-13 02/12/2014  . Tdap 12/31/2008   Pertinent  Health Maintenance Due  Topic Date Due  . MAMMOGRAM  04/04/2019 (Originally 10/22/1995)  . OPHTHALMOLOGY EXAM  04/04/2019 (Originally 10/22/1955)  . URINE MICROALBUMIN  04/04/2019 (Originally 10/22/1955)  . DEXA SCAN  04/04/2019 (Originally 10/21/2010)  . PNA vac Low Risk Adult (2 of 2 - PPSV23) 04/04/2019 (Originally 02/13/2015)  . HEMOGLOBIN A1C  03/10/2019  . INFLUENZA VACCINE  03/30/2019  . FOOT EXAM  06/20/2019  . COLONOSCOPY  06/01/2023   Fall Risk  10/30/2018 07/11/2016 01/07/2016  Falls in the past year? 0 No No   Functional Status Survey:     Body mass index is 35.48 kg/m. Physical Exam   Temp  97.5 pulse98 respirations 18 pressure 126/66 weight is 198 today  In general this is a well-nourished elderly female in no distress sitting comfortably in her wheelchair.  Her skin is warm and dry.  Eyes visual acuity appears to be intact sclera and conjunctive are clear.  Oropharynx is clear mucous membranes moist.  Chest is clear to auscultation there is no labored breathing  Heart is regular rate and rhythm pulse is in the 90s-she has mild lower extremity edema this does not appear to be increased from recent baseline.  Abdomen is somewhat obese soft nontender with positive bowel sounds  Musculoskeletal does move all extremities x4 at baseline.  does have some arthritic changes of her knees bilaterally I do not really note any increased erythema or warmth.  Cannot really appreciate significant tenderness to palpation of bilateral thighs or lower legs- Continues to have some tightness of her lower legs   The tenderness in her right great toe which  was experienced during her gout flare appears to be resolved   Neurologic is grossly intact her speech is clear cannot appreciate lateralizing findings.  Psych she is alert and oriented pleasant and appropriate.      Labs reviewed: Recent Labs    09/13/18 0447 09/14/18 0403 09/15/18 0502 09/15/18 0834 09/16/18 0359 09/17/18 0418  11/05/18 1028 11/28/18 0944 02/07/19 02/11/19 02/20/19 1442  NA 141 139 138  --  140 140   < > 145* 144 140 141 138  K 3.3* 3.0* 3.2*  --  3.5 3.7  --  4.5 4.0 4.6 4.1 5.1  CL 101 96* 94*  --  96* 94*  --  102 99  --   --  103  CO2 29 31 33*  --  34* 35*  --  23 25  --   --  25  GLUCOSE 179* 180* 182*  --  177* 204*  --  113* 119*  --   --  117*  BUN 51* 52* 57*  --  55* 57*   < > 29* 25 44* 23* 38*  CREATININE 1.22* 1.15* 1.40*  --  1.19* 1.22*  --  1.29* 1.17* 1.2* 1.1 1.26*  CALCIUM 8.3* 8.3* 8.0*  --  8.3* 8.4*  --  8.1* 7.7*  --   --  8.5*  MG 1.8 1.9  --  1.9  --   --   --   --   --   --   --   --   PHOS  --   --  4.1  --  3.5 3.6  --   --   --   --   --   --    < > =  values in this interval not displayed.   Recent Labs    09/08/18 1609 09/09/18 0824  09/16/18 0359 09/17/18 0418 02/20/19 1442  AST 15 15  --   --   --  45*  ALT 10 8  --   --   --  27  ALKPHOS 77 63  --   --   --  83  BILITOT 0.8 0.2*  --   --   --  <0.2*  PROT 6.4* 5.7*  --   --   --  6.6  ALBUMIN 3.2* 2.7*   < > 2.9* 3.1* 3.0*   < > = values in this interval not displayed.   Recent Labs    09/13/18 0447 09/14/18 0403 11/28/18 0944 02/20/19 1442  WBC 7.4 7.1 9.4 6.0  NEUTROABS 4.9 4.8  --  4.1  HGB 9.1* 8.9* 11.4 10.7*  HCT 30.4* 29.4* 35.5 34.6*  MCV 91.0 91.0 84 86.7  PLT 285 258 401 269   Lab Results  Component Value Date   TSH 3.265 09/09/2018   Lab Results  Component Value Date   HGBA1C 7.1 (H) 09/09/2018   No results found for: CHOL, HDL, LDLCALC, LDLDIRECT, TRIG, CHOLHDL  Significant Diagnostic Results in last 30 days:  No results found.   Assessment/Plan  #1 history of diastolic CHF- she has had some weight fluctuation but generally appears to be fairly stable recently-she is on Demadex 20 mg a day.  She is also on potassium supplementation--we will update a BMP  She does not complain of increased shortness of breath chest pain-apparently is doing a bit better with therapy edema appears to be at baseline.  At this point will monitor.  2.  History of hypertension-hypotension- this appears to have improved at one point we decreased her Lopressor then discontinued it-but she became tachycardic so we restarted that at lower dose this appears to be well-tolerated-.  At times she is borderline tachycardic but this is somewhat of a balancing situation clinically blood pressure appears to be fairly stable tachycardia appears to be relatively well controlled this is somewhat of a challenging situation clinically she appears to be doing oka  y #3-history of diabetes type 2 CBGs are fairly consistently in the lower mid 100s she is  now on Glucophage   #4- see discussion above she is on low-dose Lopressor.  5.  History of hypothyroidism she continues on Synthroid TSH was 3.265 on lab done very.  6 history of minor right ankle fracture she is no longer in a cam boot-she is working with therapy and apparently is progressing  #7- history of osteoarthritis she does have Voltaren gel for her left knee apparently this is helping.  She also has Robaxin as needed for muscle tightness at this point will monitor Robaxin was just recently started and as noted above we will did give this a little more time to work again would be hesitant to make Robaxin routine because of sedation-fall risk.  She also has orders for Tylenol as needed  8 history of anxiety she does continue on Ativan once a day as needed states this does help she still is experiencing some anxiety over the loss of her husband about a year ago.  9.  Anemia this is thought to be  multifactorial per discussion above she has seen hematology and they have recommended starting B12 which I will write an order for.  She does not tolerate p.o. iron well there is consideration for IV iron  if needed-.  Hemoglobin has shown relative stability at 10.7 on lab done by otology on June 24.  10 history of renal insufficiency at one point her creatinine did rise into the high ones most recently is 1.26 which has shown a slight elevation from the previous level will update this to ensure stability again this is complicated with her desire and appears need for Demadex.  11.-  History of nausea she does have an order for Zofran as needed apparently this has proved over the recent weeks she does not complain of nausea to me at one point this was somewhat of a persistent complaint-she is on a proton pump inhibitor.  12.  Recent history of gout again she responded well to Voltaren gel at one point we considered prednisone but this was not necessary it appears to have resolved-she continues on allopurinol  [id number]-of note greater than 35 minutes spent assessing patient-reviewing her chart and labs- coordinating and formulating a plan of care for numerous diagnoses- of note greater than 50% of time spent coordinating a plan of care with  input as noted above

## 2019-03-05 ENCOUNTER — Encounter: Payer: Self-pay | Admitting: Internal Medicine

## 2019-03-05 DIAGNOSIS — I959 Hypotension, unspecified: Secondary | ICD-10-CM | POA: Insufficient documentation

## 2019-03-06 ENCOUNTER — Non-Acute Institutional Stay (SKILLED_NURSING_FACILITY): Payer: Medicare Other | Admitting: Internal Medicine

## 2019-03-06 DIAGNOSIS — I9589 Other hypotension: Secondary | ICD-10-CM

## 2019-03-06 DIAGNOSIS — N183 Chronic kidney disease, stage 3 unspecified: Secondary | ICD-10-CM

## 2019-03-06 DIAGNOSIS — I5032 Chronic diastolic (congestive) heart failure: Secondary | ICD-10-CM

## 2019-03-06 NOTE — Progress Notes (Signed)
\This is an acute visit.  Level care skilled.  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  Chief complaint acute visit follow-up weight gain-.  History of present illness.  Patient is a pleasant 73 year old female who is here for rehab after sustaining a mild right ankle fracture.  She also has a history of diastolic CHF is on Demadex currently 20 mg a day as well as a type II diabetic-she also has a history of hypertension hypothyroidism osteoarthritis and anemia as well as anxiety.  She was recently slated for discharge but did when her insurance appeal and is here for short-term continued rehab.  She does have a history of diastolic CHF with chronic edema which appears to be fairly stabilized.  She was concerned earlier this week for a weight gain of about 4 pounds but it appears there are some scale fluctuations here and her weight today is 194.6 which is closer to her recent baseline.  She does not complain of any increased shortness of breath or chest pain apparently she is progressing fairly well with therapy.  She is on Demadex 20 mg a day she had been on a higher dose previously but appears to have been stable on the 20 mg now for some time.  She also has some history of renal insufficiency especially on the higher dose of Demadex this appears stabilized on lab done yesterday her creatinine was 0.82 BUN of 19 which shows stability to some mild improvement.  Currently she is lying in bed comfortably does not really have any acute complaints.  She does have some history of mild tachycardia she is on Lopressor which was restarted because of this-this is complicated with hypotension but blood pressure appears to be fairly stable 114/65 today recent other blood pressures 140/74-130/63-119/67.  Pulse at times will run slightly over 100 I got in90this evening at this point will monitor.  She does have an order for the Lopressor 12.5 mg I  Bid if  systolic is greater than 161  Past Medical  History:  Diagnosis Date  . Allergy    bee stings  . Anxiety   . Asthmatic bronchitis   . Broken arm 1957/2008   right  . Cataract    had surgery   . Depression   . Diabetes mellitus without complication ([place])    prediabetes diet controlled  . Diverticulosis   . GERD (gastroesophageal reflux disease)   . Glaucoma    BIL  . Hemorrhoids   . Hx of adenomatous colonic polyps 12/02/2010  . Hyperlipidemia   . Hypertension   . Hypothyroidism 05/2018  . Inflammatory arthritis   . Knee bursitis   . Migraines   . Obesity   . Osteoarthritis   . Sleep apnea         Past Surgical History:  Procedure Laterality Date  . CARPAL TUNNEL RELEASE Left    left wrist  . CATARACT EXTRACTION W/ INTRAOCULAR LENS  IMPLANT, BILATERAL Bilateral    1999 left, 2008 right  . COLONOSCOPY    . EXCISION MORTON'S NEUROMA Left 1978   left foot  . GLAUCOMA SURGERY Bilateral    and laser surgery, 2004 left, 2008 right  . PARTIAL HYSTERECTOMY  1991   Left an ovary  . POLYPECTOMY    . TOENAIL AVULSION Right    big toe         Allergies  Allergen Reactions  . Aspirin Anaphylaxis  . Bee Pollen     Extreme swelling due to bee stings  .  Codeine Nausea And Vomiting  . Fish Oil Diarrhea  . Sulfa Antibiotics Nausea And Vomiting  . Camphor Dermatitis  . Camphor Dermatitis  . Lisinopril Cough  . Penicillins Rash    DID THE REACTION INVOLVE: Swelling of the face/tongue/throat, SOB, or low BP? Yes Sudden or severe rash/hives, skin peeling, or the inside of the mouth or nose? Unknown Did it require medical treatment? Unknown When did it last happen? If all above answers are "NO", may proceed with cephalosporin use.   . Sulfasalazine Nausea And Vomiting  . Vicodin [Hydrocodone-Acetaminophen] Nausea And Vomiting         Allergies as of 03/04/2019      Reactions   Aspirin Anaphylaxis   Bee Pollen    Extreme swelling due to bee stings    Codeine Nausea And Vomiting   Fish Oil Diarrhea   Sulfa Antibiotics Nausea And Vomiting   Camphor Dermatitis   Camphor Dermatitis   Lisinopril Cough   Penicillins Rash   DID THE REACTION INVOLVE: Swelling of the face/tongue/throat, SOB, or low BP? Yes Sudden or severe rash/hives, skin peeling, or the inside of the mouth or nose? Unknown Did it require medical treatment? Unknown When did it last happen? If all above answers are "NO", may proceed with cephalosporin use.   Sulfasalazine Nausea And Vomiting   Vicodin [hydrocodone-acetaminophen] Nausea And Vomiting         Medication List             allopurinol 100 MG tablet Commonly known as: ZYLOPRIM Take 1 tablet (100 mg total) by mouth daily.   diclofenac sodium 1 % Gel Commonly known as: VOLTAREN Apply 2 g topically. Apply to left knee   levothyroxine 50 MCG tablet Commonly known as: SYNTHROID Take 1 tablet (50 mcg total) by mouth daily before breakfast.   LORazepam 0.5 MG tablet Commonly known as: Ativan Take 1 tablet (0.5 mg total) by mouth daily as needed for up to 14 days for anxiety (for transitional fill).   metFORMIN 500 MG tablet Commonly known as: GLUCOPHAGE Take 1 tablet (500 mg total) by mouth 2 (two) times daily with a meal.   methocarbamol 500 MG tablet Commonly known as: ROBAXIN Take 1 tablet (500 mg total) by mouth every 12 (twelve) hours as needed for muscle spasms.   metoprolol tartrate 25 MG tablet Commonly known as: LOPRESSOR Take 0.5 tablets (12.5 mg total) by mouth 2 (two) times daily. Take 0.5 tablet to = 12.5 mg if SBP >110   multivitamin with minerals Tabs tablet Take 1 tablet by mouth daily.   Nutritional Supplement Liqd Take 90 mLs by mouth daily. Med Pass   ondansetron 4 MG tablet Commonly known as: ZOFRAN Take 1 tablet (4 mg total) by mouth every 6 (six) hours as needed for nausea or vomiting. GIVE 1 TABLET BY MOUTH EVERY 6 HOURS AS NEEDED FOR  NAUSEA/VOMITING   OneTouch Verio test strip Generic drug: glucose blood USE TO TEST BLOOD GLUCOSE ONCE DAILY   pantoprazole 40 MG tablet Commonly known as: PROTONIX Take 1 tablet (40 mg total) by mouth daily.   polyvinyl alcohol 1.4 % ophthalmic solution Commonly known as: LIQUIFILM TEARS Place 1 drop into both eyes every 4 (four) hours as needed for dry eyes.   potassium chloride SA 20 MEQ tablet Commonly known as: K-DUR Take 1 tablet (20 mEq total) by mouth every Monday, Wednesday, and Friday.   torsemide 20 MG tablet Commonly known as: DEMADEX Take 1 tablet (20 mg  total) by mouth daily.   TYLENOL ARTHRITIS PAIN PO Take 650 mg by mouth every 6 (six) hours as needed (pain).       Review of systems.  General she is not complaining of any fever chills weight appears to be relatively stable in the mid 190s recently.  Skin does not complain of rashes or itching.  Head ears eyes nose mouth and throat does not complain of visual changes or sore throat.  Respiratory does not complain of shortness of breath or cough.  Cardiac does not complain of chest pain edema appears to be relatively baseline.  GI does not complain of nausea vomiting diarrhea constipation at one point she did complain of nausea earlier in his stay but this appears to have resolved.  GU does not complain of dysuria.  Musculoskeletal at times does complain of some tightness more of her lower leg area-also complains of arthritic pain of her left knee but apparently the topical Voltaren gel is helping she also has an order for PRN Robaxin as needed.  Neurologic she is not complaining of dizziness or headache or weakness apparently has improved some.  Psych she is not complaining of overt depression-she does have an order for Ativan short-term for anxiety- she has stated in the past she still is in mourning  for her husband which she lost about a year ago  Physical exam.  Temperature is 98.2 pulse  is 90 respirations 18 blood pressure 114/65 weight is 194.6.  In general this is a pleasant elderly female in no distress lying comfortably in bed.  Skin is warm and dry.  Eyes visual acuity appears to be intact sclera and conjunctive are clear.  Oropharynx is clear mucous membranes moist.  Chest is clear to auscultation there is no labored breathing.  Heart is regular rate and rhythm I got a pulse of around 90- she has mild lower extremity edema which appears relatively baseline with previous exams.  Abdomen is somewhat obese soft nontender with active bowel sounds.  Musculoskeletal Limited exam since she is lying in bed but moves her extremities appears at baseline she does have arthritic changes of her knees bilaterally with some baseline edema.  The tightness of her thighs and calves appears to be improved this evening there is no erythema or significant tenderness.  Neurologic is grossly intact her speech is clear could not really appreciate lateralizing findings.  Psych she is alert and oriented pleasant and appropriate.  Labs.  March 05, 2019.  Sodium 141 potassium 4.5 BUN 19 creatinine 0.82.  February 20, 2019.  WBC 6.0 hemoglobin 10.7 platelets 269.  Assessment and plan.  1.-  History of weight gain again she appears to be more at her baseline per lab done today at 194.6 pounds-this appears stable I suspect possibly the weight gain earlier this week with more scale variation.  She continues on Demadex 20 mg a day with potassium supplementation 20 mEq Monday Wednesday and Friday this appears to be stable potassium of 4.5 on today's lab.  2.  Renal function again this appears stable with a creatinine of 0.82 on today's lab it had been 1.26 on previous lab.  3.  Hypotension this appears to be doing somewhat better as noted above-at times she does have mild tachycardia but this is not persistent she does have the order for Lopressor 12.5 mg twice daily if systolic is greater  than 110.  [id number]

## 2019-03-07 ENCOUNTER — Encounter: Payer: Self-pay | Admitting: Internal Medicine

## 2019-03-13 ENCOUNTER — Encounter: Payer: Self-pay | Admitting: Adult Health

## 2019-03-13 ENCOUNTER — Non-Acute Institutional Stay (SKILLED_NURSING_FACILITY): Payer: Medicare Other | Admitting: Adult Health

## 2019-03-13 DIAGNOSIS — G4700 Insomnia, unspecified: Secondary | ICD-10-CM | POA: Diagnosis not present

## 2019-03-13 DIAGNOSIS — F419 Anxiety disorder, unspecified: Secondary | ICD-10-CM

## 2019-03-13 NOTE — Progress Notes (Signed)
Location:  [facility] Room Number: 119/A Place of Service:  SNF (31) Provider:  Durenda Age, DNP, FNP-BC  Patient Care Team: [name], MD as PCP - General (Family Medicine) Nahser, Wonda Cheng, MD as PCP - Cardiology (Cardiology)  Extended Emergency Contact Information Primary Emergency Contact: [place], [place] Phone: [phone] Mobile Phone: [phone] Relation: Sister  Code Status:  Full Code  Goals of care: Advanced Directive information Advanced Directives 03/13/2019  Does Patient Have a Medical Advance Directive? Yes  Type of Advance Directive (No Data)  Does patient want to make changes to medical advance directive? No - Patient declined  Would patient like information on creating a medical advance directive? -     Chief Complaint  Patient presents with  . Acute Visit    Dizzness, Anxiety    HPI:  Pt is a 73 y.o. female seen today for an acute visit. She said that she felt tightness on her chest and shivers to right arm and leg. She said that her left leg was also swollen and painful. She was seen in her room today. She said that she has 1/10 pain on her left knee and 0/10 on her back. She said that  she has taken Ativan last night because she was shaking. She is concerned about when her insurance is going to stop paying. She is thinking how she is going home if she is not yet ambulatory. She was earlier giver a brochure on where she can ask/hire people to help her. She said that she was not able to sleep well last night and was a little bit dizzy in the morning. She is thinking that Robaxin might have caused her to feel like that since she started taking it on 03/11/19.  She was supposed to be discharged but she has won her appeal with the insurance twice.  She has been admitted to [facility] on 01/11/19 from a recent hospitalization for a minor right ankle fracture and knee pain sustained from a fall at  her sister's house.  Her cam boot has now been discontinued. .  Past Medical History:  Diagnosis Date  . Allergy    bee stings  . Anxiety   . Asthmatic bronchitis   . Broken arm 1957/2008   right  . Cataract    had surgery   . Depression   . Diabetes mellitus without complication ([place])    prediabetes diet controlled  . Diverticulosis   . GERD (gastroesophageal reflux disease)   . Glaucoma    BIL  . Hemorrhoids   . Hx of adenomatous colonic polyps 12/02/2010  . Hyperlipidemia   . Hypertension   . Hypothyroidism 05/2018  . Inflammatory arthritis   . Knee bursitis   . Migraines   . Obesity   . Osteoarthritis   . Sleep apnea    Past Surgical History:  Procedure Laterality Date  . CARPAL TUNNEL RELEASE Left    left wrist  . CATARACT EXTRACTION W/ INTRAOCULAR LENS  IMPLANT, BILATERAL Bilateral    1999 left, 2008 right  . COLONOSCOPY    . EXCISION MORTON'S NEUROMA Left 1978   left foot  . GLAUCOMA SURGERY Bilateral    and laser surgery, 2004 left, 2008 right  . PARTIAL HYSTERECTOMY  1991   Left an ovary  . POLYPECTOMY    . TOENAIL AVULSION Right    big toe    Allergies  Allergen Reactions  . Aspirin Anaphylaxis  . Bee Pollen  Extreme swelling due to bee stings  . Codeine Nausea And Vomiting  . Fish Oil Diarrhea  . Sulfa Antibiotics Nausea And Vomiting  . Camphor Dermatitis  . Camphor Dermatitis  . Lisinopril Cough  . Penicillins Rash    DID THE REACTION INVOLVE: Swelling of the face/tongue/throat, SOB, or low BP? Yes Sudden or severe rash/hives, skin peeling, or the inside of the mouth or nose? Unknown Did it require medical treatment? Unknown When did it last happen? If all above answers are "NO", may proceed with cephalosporin use.   . Sulfasalazine Nausea And Vomiting  . Vicodin [Hydrocodone-Acetaminophen] Nausea And Vomiting    Outpatient Encounter Medications as of 03/13/2019  Medication Sig  . Acetaminophen (TYLENOL ARTHRITIS PAIN PO)  Take 650 mg by mouth every 6 (six) hours as needed (pain).   [name] allopurinol (ZYLOPRIM) 100 MG tablet Take 1 tablet (100 mg total) by mouth daily.  . bisacodyl (DULCOLAX) 10 MG suppository If not relieved by MOM, give 10 mg Bisacodyl suppositiory rectally X 1 dose in 24 hours as needed (Do not use constipation standing orders for residents with renal failure/CFR less than 30. Contact MD for orders) (Physician Order)  . diclofenac sodium (VOLTAREN) 1 % GEL Apply 2 g topically. Apply to left knee  . levothyroxine (SYNTHROID) 50 MCG tablet Take 1 tablet (50 mcg total) by mouth daily before breakfast.  . LORazepam (ATIVAN) 0.5 MG tablet Take 1 tablet (0.5 mg total) by mouth daily as needed for up to 14 days for anxiety (for transitional fill).  . magnesium hydroxide (MILK OF MAGNESIA) 400 MG/5ML suspension If no BM in 3 days, give 30 cc Milk of Magnesium p.o. x 1 dose in 24 hours as needed (Do not use standing constipation orders for residents with renal failure CFR less than 30. Contact MD for orders) (Physician Order)  . metFORMIN (GLUCOPHAGE) 500 MG tablet Take 1 tablet (500 mg total) by mouth 2 (two) times daily with a meal.  . methocarbamol (ROBAXIN) 500 MG tablet Take 1 tablet (500 mg total) by mouth every 12 (twelve) hours as needed for muscle spasms.  . metoprolol tartrate (LOPRESSOR) 25 MG tablet Take 0.5 tablets (12.5 mg total) by mouth 2 (two) times daily. Take 0.5 tablet to = 12.5 mg if SBP >110  . Multiple Vitamin (MULTIVITAMIN WITH MINERALS) TABS tablet Take 1 tablet by mouth daily.   . Nutritional Supplement LIQD Take 90 mLs by mouth daily. Med Pass  . ondansetron (ZOFRAN) 4 MG tablet Take 1 tablet (4 mg total) by mouth every 6 (six) hours as needed for nausea or vomiting. GIVE 1 TABLET BY MOUTH EVERY 6 HOURS AS NEEDED FOR NAUSEA/VOMITING  . ONETOUCH VERIO test strip USE TO TEST BLOOD GLUCOSE ONCE DAILY  . pantoprazole (PROTONIX) 40 MG tablet Take 1 tablet (40 mg total) by mouth daily.  .  polyvinyl alcohol (LIQUIFILM TEARS) 1.4 % ophthalmic solution Place 1 drop into both eyes every 4 (four) hours as needed for dry eyes.  . potassium chloride SA (K-DUR) 20 MEQ tablet Take 1 tablet (20 mEq total) by mouth every Monday, Wednesday, and Friday.  . Sodium Phosphates (RA SALINE ENEMA RE) If not relieved by Biscodyl suppository, give disposable Saline Enema rectally X 1 dose/24 hrs as needed (Do not use constipation standing orders for residents with renal failure/CFR less than 30. Contact MD for orders)(Physician Or  . vitamin B-12 (CYANOCOBALAMIN) 1000 MCG tablet Take 1,000 mcg by mouth daily.  . [DISCONTINUED] torsemide (DEMADEX) 20 MG  tablet Take 1 tablet (20 mg total) by mouth daily.   No facility-administered encounter medications on file as of 03/13/2019.     Review of Systems  GENERAL: No change in appetite, no fatigue, no weight changes, no fever, chills or weakness SKIN: Denies rash, itching, wounds, ulcer sores, or nail abnormalities EYES: Denies change in vision, dry eyes, eye pain, itching or discharge EARS: Denies change in hearing, ringing in ears, or earache NOSE: Denies nasal congestion or epistaxis MOUTH and THROAT: Denies oral discomfort, gingival pain or bleeding, pain from teeth or hoarseness   RESPIRATORY: no cough, SOB, DOE, wheezing, hemoptysis CARDIAC: No chest pain, edema or palpitations GI: No abdominal pain, diarrhea, constipation, heart burn, nausea or vomiting GU: Denies dysuria, frequency, hematuria, incontinence, or discharge NEUROLOGICAL: Denies dizziness, syncope, numbness, or headache PSYCHIATRIC: Denies feelings of depression or anxiety. No report of hallucinations, insomnia, paranoia, or agitation    Immunization History  Administered Date(s) Administered  . Pneumococcal Conjugate-13 02/12/2014  . Tdap 12/31/2008   Pertinent  Health Maintenance Due  Topic Date Due  . MAMMOGRAM  04/04/2019 (Originally 10/22/1995)  . OPHTHALMOLOGY EXAM   04/04/2019 (Originally 10/22/1955)  . URINE MICROALBUMIN  04/04/2019 (Originally 10/22/1955)  . DEXA SCAN  04/04/2019 (Originally 10/21/2010)  . PNA vac Low Risk Adult (2 of 2 - PPSV23) 04/04/2019 (Originally 02/13/2015)  . HEMOGLOBIN A1C  04/13/2019 (Originally 03/10/2019)  . INFLUENZA VACCINE  03/30/2019  . FOOT EXAM  06/20/2019  . COLONOSCOPY  06/01/2023   Fall Risk  10/30/2018 07/11/2016 01/07/2016  Falls in the past year? 0 No No     Vitals:   03/13/19 1416  BP: 124/76  Pulse: 91  Resp: 18  Temp: (!) 97.4 F (36.3 C)  TempSrc: Oral  SpO2: 94%  Weight: 193 lb 3.2 oz (87.6 kg)  Height: 5\' 2"  (1.575 m)   Body mass index is 35.34 kg/m.  Physical Exam  GENERAL APPEARANCE: Well nourished. In no acute distress. Obese SKIN:  Skin is warm and dry.  MOUTH and THROAT: Lips are without lesions. Oral mucosa is moist and without lesions. Tongue is normal in shape, size, and color and without lesions RESPIRATORY: Breathing is even & unlabored, BS CTAB CARDIAC: RRR, no murmur,no extra heart sounds, no edema GI: Abdomen soft, normal BS, no masses, no tenderness EXTREMITIES:  Able to move X 4 extremities NEUROLOGICAL: There is no tremor. Speech is clear. Alert and oriented X 3. PSYCHIATRIC:  Affect and behavior are appropriate   Labs reviewed: Recent Labs    09/13/18 0447 09/14/18 0403 09/15/18 0502 09/15/18 0834 09/16/18 0359 09/17/18 0418  11/05/18 1028 11/28/18 0944 02/07/19 02/11/19 02/20/19 1442  NA 141 139 138  --  140 140   < > 145* 144 140 141 138  K 3.3* 3.0* 3.2*  --  3.5 3.7  --  4.5 4.0 4.6 4.1 5.1  CL 101 96* 94*  --  96* 94*  --  102 99  --   --  103  CO2 29 31 33*  --  34* 35*  --  23 25  --   --  25  GLUCOSE 179* 180* 182*  --  177* 204*  --  113* 119*  --   --  117*  BUN 51* 52* 57*  --  55* 57*   < > 29* 25 44* 23* 38*  CREATININE 1.22* 1.15* 1.40*  --  1.19* 1.22*  --  1.29* 1.17* 1.2* 1.1 1.26*  CALCIUM 8.3* 8.3* 8.0*  --  8.3* 8.4*  --  8.1* 7.7*  --   --   8.5*  MG 1.8 1.9  --  1.9  --   --   --   --   --   --   --   --   PHOS  --   --  4.1  --  3.5 3.6  --   --   --   --   --   --    < > = values in this interval not displayed.   Recent Labs    09/08/18 1609 09/09/18 0824  09/16/18 0359 09/17/18 0418 02/20/19 1442  AST 15 15  --   --   --  45*  ALT 10 8  --   --   --  27  ALKPHOS 77 63  --   --   --  83  BILITOT 0.8 0.2*  --   --   --  <0.2*  PROT 6.4* 5.7*  --   --   --  6.6  ALBUMIN 3.2* 2.7*   < > 2.9* 3.1* 3.0*   < > = values in this interval not displayed.   Recent Labs    09/13/18 0447 09/14/18 0403 11/28/18 0944 02/20/19 1442  WBC 7.4 7.1 9.4 6.0  NEUTROABS 4.9 4.8  --  4.1  HGB 9.1* 8.9* 11.4 10.7*  HCT 30.4* 29.4* 35.5 34.6*  MCV 91.0 91.0 84 86.7  PLT 285 258 401 269   Lab Results  Component Value Date   TSH 3.265 09/09/2018   Lab Results  Component Value Date   HGBA1C 7.1 (H) 09/09/2018    Assessment/Plan  1. Anxiety - continue Ativan 0.5 mg 1 tab daily PRN X 14 days  2. Insomnia, unspecified type - discontinue Robaxin which she thinks caused her not to sleep last night and causing chest and leg tightness, will start Melatonin 3 mg 1 tab Q HS PRN   Family/ staff Communication:  Discussed plan of care with resident.  Labs/tests ordered: None  Goals of care:  Short-term care   Durenda Age, DNP, FNP-BC [facility] [phone] (Monday-Friday 8:00 a.m. - 5:00 p.m.) [phone] (after hours)

## 2019-03-29 ENCOUNTER — Encounter: Payer: Self-pay | Admitting: Adult Health

## 2019-03-29 DIAGNOSIS — E118 Type 2 diabetes mellitus with unspecified complications: Secondary | ICD-10-CM

## 2019-03-29 DIAGNOSIS — I110 Hypertensive heart disease with heart failure: Secondary | ICD-10-CM

## 2019-03-29 DIAGNOSIS — M1A9XX0 Chronic gout, unspecified, without tophus (tophi): Secondary | ICD-10-CM

## 2019-03-29 DIAGNOSIS — E034 Atrophy of thyroid (acquired): Secondary | ICD-10-CM

## 2019-03-29 DIAGNOSIS — K219 Gastro-esophageal reflux disease without esophagitis: Secondary | ICD-10-CM

## 2019-03-29 NOTE — Progress Notes (Deleted)
Location:  [place] Room Number: 119-A Place of Service:  SNF (31) Provider:  Durenda Age, DNP, FNP-BC  Patient Care Team: Megan Neer, MD as PCP - General (Family Medicine) Nahser, Megan Cheng, MD as PCP - Cardiology (Cardiology)  Extended Emergency Contact Information Primary Emergency Contact: Megan [name], [place] Phone: [phone] Mobile Phone: [phone] Relation: Sister  Code Status:  Full Code  Goals of care: Advanced Directive information Advanced Directives 03/13/2019  Does Patient Have a Medical Advance Directive? Yes  Type of Advance Directive (No Data)  Does patient want to make changes to medical advance directive? No - Patient declined  Would patient like information on creating a medical advance directive? -     Chief Complaint  Patient presents with  . Discharge Note    Patient is seen for discharge visit.    HPI:  Pt is a 73 y.o. [name] seen today for discharge.  She will discharge home *** with home health ****. She was a short-term rehabilitation resident of [facility]. She has a PMH of anxiety, depression, DM without complications, glaucoma, HLD, HTN, and hypothyroidism.   Past Medical History:  Diagnosis Date  . Allergy    bee stings  . Anxiety   . Asthmatic bronchitis   . Broken arm 1957/2008   right  . Cataract    had surgery   . Depression   . Diabetes mellitus without complication ([place])    prediabetes diet controlled  . Diverticulosis   . GERD (gastroesophageal reflux disease)   . Glaucoma    BIL  . Hemorrhoids   . Hx of adenomatous colonic polyps 12/02/2010  . Hyperlipidemia   . Hypertension   . Hypothyroidism 05/2018  . Inflammatory arthritis   . Knee bursitis   . Migraines   . Obesity   . Osteoarthritis   . Sleep apnea    Past Surgical History:  Procedure Laterality Date  . CARPAL TUNNEL RELEASE Left    left wrist  . CATARACT EXTRACTION W/ INTRAOCULAR LENS   IMPLANT, BILATERAL Bilateral    1999 left, 2008 right  . COLONOSCOPY    . EXCISION MORTON'S NEUROMA Left 1978   left foot  . GLAUCOMA SURGERY Bilateral    and laser surgery, 2004 left, 2008 right  . PARTIAL HYSTERECTOMY  1991   Left an ovary  . POLYPECTOMY    . TOENAIL AVULSION Right    big toe    Allergies  Allergen Reactions  . Aspirin Anaphylaxis  . Bee Pollen     Extreme swelling due to bee stings  . Codeine Nausea And Vomiting  . Fish Oil Diarrhea  . Sulfa Antibiotics Nausea And Vomiting  . Camphor Dermatitis  . Camphor Dermatitis  . Lisinopril Cough  . Penicillins Rash    DID THE REACTION INVOLVE: Swelling of the face/tongue/throat, SOB, or low BP? Yes Sudden or severe rash/hives, skin peeling, or the inside of the mouth or nose? Unknown Did it require medical treatment? Unknown When did it last happen? If all above answers are "NO", may proceed with cephalosporin use.   . Sulfasalazine Nausea And Vomiting  . Vicodin [Hydrocodone-Acetaminophen] Nausea And Vomiting    Outpatient Encounter Medications as of 03/29/2019  Medication Sig  . Acetaminophen (TYLENOL ARTHRITIS PAIN PO) Take 650 mg by mouth every 6 (six) hours as needed (pain).   [name] allopurinol (ZYLOPRIM) 100 MG tablet Take 1 tablet (100 mg total) by mouth daily.  . bisacodyl (DULCOLAX) 10 MG suppository  If not relieved by MOM, give 10 mg Bisacodyl suppositiory rectally X 1 dose in 24 hours as needed (Do not use constipation standing orders for residents with renal failure/CFR less than 30. Contact MD for orders) (Physician Order)  . diclofenac sodium (VOLTAREN) 1 % GEL Apply 4 g topically 2 (two) times a day. Apply to left knee  . levothyroxine (SYNTHROID) 50 MCG tablet Take 1 tablet (50 mcg total) by mouth daily before breakfast.  . magnesium hydroxide (MILK OF MAGNESIA) 400 MG/5ML suspension If no BM in 3 days, give 30 cc Milk of Magnesium p.o. x 1 dose in 24 hours as needed (Do not use standing  constipation orders for residents with renal failure CFR less than 30. Contact MD for orders) (Physician Order)  . metFORMIN (GLUCOPHAGE) 500 MG tablet Take 1 tablet (500 mg total) by mouth 2 (two) times daily with a meal.  . metoprolol tartrate (LOPRESSOR) 25 MG tablet Take 0.5 tablets (12.5 mg total) by mouth 2 (two) times daily. Take 0.5 tablet to = 12.5 mg if SBP >110  . Multiple Vitamin (MULTIVITAMIN WITH MINERALS) TABS tablet Take 1 tablet by mouth daily.   . Nutritional Supplement LIQD Take 120 mLs by mouth 2 (two) times a day. MedPass  . ondansetron (ZOFRAN) 4 MG tablet Take 1 tablet (4 mg total) by mouth every 6 (six) hours as needed for nausea or vomiting. GIVE 1 TABLET BY MOUTH EVERY 6 HOURS AS NEEDED FOR NAUSEA/VOMITING  . pantoprazole (PROTONIX) 40 MG tablet Take 1 tablet (40 mg total) by mouth daily.  . polyvinyl alcohol (LIQUIFILM TEARS) 1.4 % ophthalmic solution Place 1 drop into both eyes every 4 (four) hours as needed for dry eyes.  . potassium chloride SA (K-DUR) 20 MEQ tablet Take 1 tablet (20 mEq total) by mouth every Monday, Wednesday, and Friday.  . Sodium Phosphates (RA SALINE ENEMA RE) If not relieved by Biscodyl suppository, give disposable Saline Enema rectally X 1 dose/24 hrs as needed (Do not use constipation standing orders for residents with renal failure/CFR less than 30. Contact MD for orders)(Physician Or  . torsemide (DEMADEX) 20 MG tablet Take 20 mg by mouth daily.  . vitamin B-12 (CYANOCOBALAMIN) 1000 MCG tablet Take 1,000 mcg by mouth daily.  Glory Rosebush VERIO test strip USE TO TEST BLOOD GLUCOSE ONCE DAILY  . [DISCONTINUED] methocarbamol (ROBAXIN) 500 MG tablet Take 1 tablet (500 mg total) by mouth every 12 (twelve) hours as needed for muscle spasms.   No facility-administered encounter medications on file as of 03/29/2019.     Review of Systems  GENERAL: No change in appetite, no fatigue, no weight changes, no fever, chills or weakness SKIN: Denies rash,  itching, wounds, ulcer sores, or nail abnormalities EYES: Denies change in vision, dry eyes, eye pain, itching or discharge EARS: Denies change in hearing, ringing in ears, or earache NOSE: Denies nasal congestion or epistaxis MOUTH and THROAT: Denies oral discomfort, gingival pain or bleeding, pain from teeth or hoarseness   RESPIRATORY: no cough, SOB, DOE, wheezing, hemoptysis CARDIAC: No chest pain, edema or palpitations GI: No abdominal pain, diarrhea, constipation, heart burn, nausea or vomiting GU: Denies dysuria, frequency, hematuria, incontinence, or discharge MUSCULOSKELETAL: Denies joint pain, muscle pain, back pain, restricted movement, or unusual weakness CIRCULATION: Denies claudication, edema of legs, varicosities, or cold extremities NEUROLOGICAL: Denies dizziness, syncope, numbness, or headache PSYCHIATRIC: Denies feelings of depression or anxiety. No report of hallucinations, insomnia, paranoia, or agitation ENDOCRINE: Denies polyphagia, polyuria, polydipsia, heat or cold  intolerance HEME/LYMPH: Denies excessive bruising, petechia, enlarged lymph nodes, or bleeding problems IMMUNOLOGIC: Denies history of frequent infections, AIDS, or use of immunosuppressive agents   Immunization History  Administered Date(s) Administered  . Pneumococcal Conjugate-13 02/12/2014  . Tdap 12/31/2008   Pertinent  Health Maintenance Due  Topic Date Due  . MAMMOGRAM  04/04/2019 (Originally 10/22/1995)  . OPHTHALMOLOGY EXAM  04/04/2019 (Originally 10/22/1955)  . URINE MICROALBUMIN  04/04/2019 (Originally 10/22/1955)  . DEXA SCAN  04/04/2019 (Originally 10/21/2010)  . PNA vac Low Risk Adult (2 of 2 - PPSV23) 04/04/2019 (Originally 02/13/2015)  . HEMOGLOBIN A1C  04/13/2019 (Originally 03/10/2019)  . INFLUENZA VACCINE  03/30/2019  . FOOT EXAM  06/20/2019  . COLONOSCOPY  06/01/2023   Fall Risk  10/30/2018 07/11/2016 01/07/2016  Falls in the past year? 0 No No     Vitals:   03/29/19 0914  BP:  136/72  Pulse: 89  Resp: 20  Temp: 97.9 F (36.6 C)  TempSrc: Oral  Weight: 189 lb 12.8 oz (86.1 kg)  Height: 5\' 2"  (1.575 m)   Body mass index is 34.71 kg/m.  Physical Exam  GENERAL APPEARANCE: Well nourished. In no acute distress. Normal body habitus SKIN:  Skin is warm and dry. There are no suspicious lesions or rash HEAD: Normal in size and contour. No evidence of trauma EYES: Lids open and close normally. No blepharitis, entropion or ectropion. PERRL. Conjunctivae are clear and sclerae are white. Lenses are without opacity EARS: Pinnae are normal. Patient hears normal voice tunes of the examiner MOUTH and THROAT: Lips are without lesions. Oral mucosa is moist and without lesions. Tongue is normal in shape, size, and color and without lesions NECK: supple, trachea midline, no neck masses, no thyroid tenderness, no thyromegaly LYMPHATICS: No LAN in the neck, no supraclavicular LAN RESPIRATORY: Breathing is even & unlabored, BS CTAB CARDIAC: RRR, no murmur,no extra heart sounds, no edema GI: Abdomen soft, normal BS, no masses, no tenderness, no hepatomegaly, no splenomegaly MUSCULOSKELETAL: No deformities. Movement at each extremity is full and painless. Strength is 5/5 at each extremity. Back is without kyphosis or scoliosis CIRCULATION: Pedal pulses are 2+. There is no edema of the legs, ankles and feet NEUROLOGICAL: There is no tremor. Speech is clear PSYCHIATRIC: Alert and oriented X 3. Affect and behavior are appropriate  Labs reviewed: Recent Labs    09/13/18 0447 09/14/18 0403 09/15/18 0502 09/15/18 0834 09/16/18 0359 09/17/18 0418  11/05/18 1028 11/28/18 0944 02/07/19 02/11/19 02/20/19 1442  NA 141 139 138  --  140 140   < > 145* 144 140 141 138  K 3.3* 3.0* 3.2*  --  3.5 3.7  --  4.5 4.0 4.6 4.1 5.1  CL 101 96* 94*  --  96* 94*  --  102 99  --   --  103  CO2 29 31 33*  --  34* 35*  --  23 25  --   --  25  GLUCOSE 179* 180* 182*  --  177* 204*  --  113* 119*   --   --  117*  BUN 51* 52* 57*  --  55* 57*   < > 29* 25 Megan* 23* 38*  CREATININE 1.22* 1.15* 1.40*  --  1.19* 1.22*  --  1.29* 1.17* 1.2* 1.1 1.26*  CALCIUM 8.3* 8.3* 8.0*  --  8.3* 8.4*  --  8.1* 7.7*  --   --  8.5*  MG 1.8 1.9  --  1.9  --   --   --   --   --   --   --   --  PHOS  --   --  4.1  --  3.5 3.6  --   --   --   --   --   --    < > = values in this interval not displayed.   Recent Labs    09/08/18 1609 09/09/18 0824  09/16/18 0359 09/17/18 0418 02/20/19 1442  AST 15 15  --   --   --  45*  ALT 10 8  --   --   --  27  ALKPHOS 77 63  --   --   --  83  BILITOT 0.8 0.2*  --   --   --  <0.2*  PROT 6.4* 5.7*  --   --   --  6.6  ALBUMIN 3.2* 2.7*   < > 2.9* 3.1* 3.0*   < > = values in this interval not displayed.   Recent Labs    09/13/18 0447 09/14/18 0403 11/28/18 0944 02/20/19 1442  WBC 7.4 7.1 9.4 6.0  NEUTROABS 4.9 4.8  --  4.1  HGB 9.1* 8.9* 11.4 10.7*  HCT 30.4* 29.4* 35.5 34.6*  MCV 91.0 91.0 84 86.7  PLT 285 258 401 269   Lab Results  Component Value Date   TSH 3.265 09/09/2018   Lab Results  Component Value Date   HGBA1C 7.1 (H) 09/09/2018   No results found for: CHOL, HDL, LDLCALC, LDLDIRECT, TRIG, CHOLHDL  Significant Diagnostic Results in last 30 days:  No results found.  Assessment/Plan ***   Family/ staff Communication: ***  Labs/tests ordered:  ***  Goals of care:   Discharge.   Durenda Age, DNP, FNP-BC [facility] [phone] (Monday-Friday 8:00 a.m. - 5:00 p.m.) [phone] (after hours)

## 2019-03-29 NOTE — Progress Notes (Signed)
This encounter was created in error - please disregard.

## 2019-04-01 ENCOUNTER — Encounter: Payer: Self-pay | Admitting: Adult Health

## 2019-04-01 ENCOUNTER — Non-Acute Institutional Stay (SKILLED_NURSING_FACILITY): Payer: Medicare Other | Admitting: Adult Health

## 2019-04-01 DIAGNOSIS — I9589 Other hypotension: Secondary | ICD-10-CM

## 2019-04-01 DIAGNOSIS — F419 Anxiety disorder, unspecified: Secondary | ICD-10-CM | POA: Diagnosis not present

## 2019-04-01 DIAGNOSIS — I5032 Chronic diastolic (congestive) heart failure: Secondary | ICD-10-CM | POA: Diagnosis not present

## 2019-04-01 DIAGNOSIS — G4700 Insomnia, unspecified: Secondary | ICD-10-CM | POA: Diagnosis not present

## 2019-04-01 MED ORDER — LORAZEPAM 0.5 MG PO TABS: 0.5000 mg | ORAL_TABLET | Freq: Two times a day (BID) | ORAL | 1 refills | Status: DC | PRN
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
## 2019-04-02 ENCOUNTER — Encounter: Payer: Self-pay | Admitting: Adult Health

## 2019-04-02 ENCOUNTER — Non-Acute Institutional Stay (SKILLED_NURSING_FACILITY): Payer: Medicare Other | Admitting: Adult Health

## 2019-04-02 DIAGNOSIS — E569 Vitamin deficiency, unspecified: Secondary | ICD-10-CM

## 2019-04-02 DIAGNOSIS — M1A9XX0 Chronic gout, unspecified, without tophus (tophi): Secondary | ICD-10-CM

## 2019-04-02 DIAGNOSIS — G63 Polyneuropathy in diseases classified elsewhere: Secondary | ICD-10-CM

## 2019-04-02 DIAGNOSIS — E034 Atrophy of thyroid (acquired): Secondary | ICD-10-CM | POA: Diagnosis not present

## 2019-04-02 DIAGNOSIS — I110 Hypertensive heart disease with heart failure: Secondary | ICD-10-CM

## 2019-04-02 DIAGNOSIS — M5416 Radiculopathy, lumbar region: Secondary | ICD-10-CM

## 2019-04-02 DIAGNOSIS — K219 Gastro-esophageal reflux disease without esophagitis: Secondary | ICD-10-CM

## 2019-04-02 DIAGNOSIS — S82891D Other fracture of right lower leg, subsequent encounter for closed fracture with routine healing: Secondary | ICD-10-CM

## 2019-04-02 DIAGNOSIS — I5032 Chronic diastolic (congestive) heart failure: Secondary | ICD-10-CM

## 2019-04-02 DIAGNOSIS — M1712 Unilateral primary osteoarthritis, left knee: Secondary | ICD-10-CM

## 2019-04-02 DIAGNOSIS — E876 Hypokalemia: Secondary | ICD-10-CM

## 2019-04-02 DIAGNOSIS — E118 Type 2 diabetes mellitus with unspecified complications: Secondary | ICD-10-CM | POA: Diagnosis not present

## 2019-04-02 DIAGNOSIS — R110 Nausea: Secondary | ICD-10-CM | POA: Insufficient documentation

## 2019-04-02 DIAGNOSIS — F419 Anxiety disorder, unspecified: Secondary | ICD-10-CM

## 2019-04-02 DIAGNOSIS — H04123 Dry eye syndrome of bilateral lacrimal glands: Secondary | ICD-10-CM | POA: Insufficient documentation

## 2019-04-02 DIAGNOSIS — G4700 Insomnia, unspecified: Secondary | ICD-10-CM

## 2019-04-02 MED ORDER — MELATONIN 3 MG PO TABS: 3.0000 mg | ORAL_TABLET | Freq: Every day | ORAL | 0 refills | Status: AC

## 2019-04-02 MED ORDER — ONDANSETRON HCL 4 MG PO TABS: 4.0000 mg | ORAL_TABLET | Freq: Four times a day (QID) | ORAL | 0 refills | Status: AC | PRN

## 2019-04-02 MED ORDER — POLYVINYL ALCOHOL 1.4 % OP SOLN: 1.0000 [drp] | OPHTHALMIC | 0 refills | Status: AC | PRN

## 2019-04-02 MED ORDER — METOPROLOL TARTRATE 25 MG PO TABS: 12.5000 mg | ORAL_TABLET | Freq: Two times a day (BID) | ORAL | 0 refills | Status: DC

## 2019-04-02 MED ORDER — VITAMIN B-12 1000 MCG PO TABS: ORAL_TABLET | ORAL | 0 refills | Status: AC

## 2019-04-02 MED ORDER — LORAZEPAM 0.5 MG PO TABS: 0.5000 mg | ORAL_TABLET | Freq: Two times a day (BID) | ORAL | 0 refills | Status: DC | PRN

## 2019-04-02 MED ORDER — LIDOCAINE 4 % EX PTCH: 1.0000 | MEDICATED_PATCH | Freq: Every day | CUTANEOUS | 0 refills | Status: DC

## 2019-04-02 MED ORDER — TORSEMIDE 20 MG PO TABS: ORAL_TABLET | ORAL | 0 refills | Status: DC

## 2019-04-02 MED ORDER — ONETOUCH VERIO VI STRP: ORAL_STRIP | 0 refills | Status: AC

## 2019-04-02 MED ORDER — METFORMIN HCL 500 MG PO TABS: 500.0000 mg | ORAL_TABLET | Freq: Two times a day (BID) | ORAL | 0 refills | Status: DC

## 2019-04-02 MED ORDER — LEVOTHYROXINE SODIUM 50 MCG PO TABS: 50.0000 ug | ORAL_TABLET | Freq: Every day | ORAL | 0 refills | Status: DC

## 2019-04-02 MED ORDER — BISACODYL 10 MG RE SUPP: RECTAL | 0 refills | Status: DC

## 2019-04-02 MED ORDER — ALLOPURINOL 100 MG PO TABS: 100.0000 mg | ORAL_TABLET | Freq: Every day | ORAL | 0 refills | Status: DC

## 2019-04-02 MED ORDER — PANTOPRAZOLE SODIUM 40 MG PO TBEC: 40.0000 mg | DELAYED_RELEASE_TABLET | Freq: Every day | ORAL | 0 refills | Status: DC

## 2019-04-02 MED ORDER — DICLOFENAC SODIUM 1 % TD GEL: TRANSDERMAL | 0 refills | Status: DC

## 2019-04-02 MED ORDER — POTASSIUM CHLORIDE CRYS ER 20 MEQ PO TBCR: 20.0000 meq | EXTENDED_RELEASE_TABLET | ORAL | 0 refills | Status: DC

## 2019-04-02 NOTE — Progress Notes (Addendum)
Location:  [facility] Room Number: 119/A Place of Service:  SNF (31) Provider:  Durenda Age, DNP, FNP-BC  Patient Care Team: [name], MD as PCP - General (Family Medicine) Nahser, Wonda Cheng, MD as PCP - Cardiology (Cardiology)  Extended Emergency Contact Information Primary Emergency Contact: [place], [place] Phone: [phone] Mobile Phone: [phone] Relation: Sister  Code Status:  Full Code  Goals of care: Advanced Directive information Advanced Directives 04/02/2019  Does Patient Have a Medical Advance Directive? Yes  Type of Advance Directive (No Data)  Does patient want to make changes to medical advance directive? No - Patient declined  Would patient like information on creating a medical advance directive? -     Chief Complaint  Patient presents with  . Discharge Note    Discharge Visit    HPI:  Pt is a 73 y.o. female seen today for a discharge visit. She will discharge 04/04/19 with Home health PT and OT for therapeutic strengthening exercises, SW for community resources and Nursing for medication management.  She has been admitted to [facility] on 01/11/2019 from a recent hospitalization for a minor right ankle fracture and knee pain sustained from a fall.  She has PMH of anxiety, depression, DM, glaucoma, hyperlipidemia, hypertension and hypothyroidism.   Patient was admitted to this facility for short-term rehabilitation after the patient's recent hospitalization.  Patient has completed SNF rehabilitation and therapy has cleared the patient for discharge.   Past Medical History:  Diagnosis Date  . Allergy    bee stings  . Anxiety   . Asthmatic bronchitis   . Broken arm 1957/2008   right  . Cataract    had surgery   . Depression   . Diabetes mellitus without complication ([place])    prediabetes diet controlled  . Diverticulosis   . GERD (gastroesophageal reflux disease)   . Glaucoma    BIL  . Hemorrhoids   . Hx of adenomatous colonic polyps 12/02/2010  . Hyperlipidemia   . Hypertension   . Hypothyroidism 05/2018  . Inflammatory arthritis   . Knee bursitis   . Migraines   . Obesity   . Osteoarthritis   . Sleep apnea    Past Surgical History:  Procedure Laterality Date  . CARPAL TUNNEL RELEASE Left    left wrist  . CATARACT EXTRACTION W/ INTRAOCULAR LENS  IMPLANT, BILATERAL Bilateral    1999 left, 2008 right  . COLONOSCOPY    . EXCISION MORTON'S NEUROMA Left 1978   left foot  . GLAUCOMA SURGERY Bilateral    and laser surgery, 2004 left, 2008 right  . PARTIAL HYSTERECTOMY  1991   Left an ovary  . POLYPECTOMY    . TOENAIL AVULSION Right    big toe    Allergies  Allergen Reactions  . Aspirin Anaphylaxis  . Bee Pollen     Extreme swelling due to bee stings  . Codeine Nausea And Vomiting  . Fish Oil Diarrhea  . Sulfa Antibiotics Nausea And Vomiting  . Camphor Dermatitis  . Camphor Dermatitis  . Lisinopril Cough  . Penicillins Rash    DID THE REACTION INVOLVE: Swelling of the face/tongue/throat, SOB, or low BP? Yes Sudden or severe rash/hives, skin peeling, or the inside of the mouth or nose? Unknown Did it require medical treatment? Unknown When did it last happen? If all above answers are "NO", may proceed with cephalosporin use.   . Sulfasalazine Nausea And Vomiting  . Vicodin [Hydrocodone-Acetaminophen] Nausea And  Vomiting    Outpatient Encounter Medications as of 04/02/2019  Medication Sig  . Acetaminophen (TYLENOL ARTHRITIS PAIN PO) Take 650 mg by mouth every 6 (six) hours as needed (pain).   [name] allopurinol (ZYLOPRIM) 100 MG tablet Take 1 tablet (100 mg total) by mouth daily.  . bisacodyl (DULCOLAX) 10 MG suppository If not relieved by MOM, give 10 mg Bisacodyl suppositiory rectally X 1 dose in 24 hours as needed (Do not use constipation standing orders for residents with renal failure/CFR less than 30. Contact MD for orders) (Physician  Order)  . diclofenac sodium (VOLTAREN) 1 % GEL Apply 4 g topically 2 (two) times a day. Apply to left knee  . levothyroxine (SYNTHROID) 50 MCG tablet Take 1 tablet (50 mcg total) by mouth daily before breakfast.  . Lidocaine (ASPERCREME LIDOCAINE) 4 % PTCH APPLY TOPICALLY TO LOWER BACK ONCE DAILY FOR PAIN  . LORazepam (ATIVAN) 0.5 MG tablet Take 1 tablet (0.5 mg total) by mouth 2 (two) times daily as needed for up to 14 days for anxiety.  . magnesium hydroxide (MILK OF MAGNESIA) 400 MG/5ML suspension If no BM in 3 days, give 30 cc Milk of Magnesium p.o. x 1 dose in 24 hours as needed (Do not use standing constipation orders for residents with renal failure CFR less than 30. Contact MD for orders) (Physician Order)  . Melatonin 3 MG TABS Take 3 mg by mouth at bedtime. FOR INSOMNIA  . metFORMIN (GLUCOPHAGE) 500 MG tablet Take 1 tablet (500 mg total) by mouth 2 (two) times daily with a meal.  . metoprolol tartrate (LOPRESSOR) 25 MG tablet Take 0.5 tablets (12.5 mg total) by mouth 2 (two) times daily. Take 0.5 tablet to = 12.5 mg if SBP >110  . Multiple Vitamin (MULTIVITAMIN WITH MINERALS) TABS tablet Take 1 tablet by mouth daily.   . Nutritional Supplement LIQD Take 120 mLs by mouth 2 (two) times a day. MedPass  . ondansetron (ZOFRAN) 4 MG tablet Take 1 tablet (4 mg total) by mouth every 6 (six) hours as needed for nausea or vomiting. GIVE 1 TABLET BY MOUTH EVERY 6 HOURS AS NEEDED FOR NAUSEA/VOMITING  . ONETOUCH VERIO test strip USE TO TEST BLOOD GLUCOSE ONCE DAILY  . pantoprazole (PROTONIX) 40 MG tablet Take 1 tablet (40 mg total) by mouth daily.  . polyvinyl alcohol (LIQUIFILM TEARS) 1.4 % ophthalmic solution Place 1 drop into both eyes every 4 (four) hours as needed for dry eyes.  . potassium chloride SA (K-DUR) 20 MEQ tablet Take 1 tablet (20 mEq total) by mouth every Monday, Wednesday, and Friday.  . Sodium Phosphates (RA SALINE ENEMA RE) If not relieved by Biscodyl suppository, give disposable  Saline Enema rectally X 1 dose/24 hrs as needed (Do not use constipation standing orders for residents with renal failure/CFR less than 30. Contact MD for orders)(Physician Or  . torsemide (DEMADEX) 20 MG tablet Take 20 mg by mouth daily.  . vitamin B-12 (CYANOCOBALAMIN) 1000 MCG tablet Take 1,000 mcg by mouth daily.   No facility-administered encounter medications on file as of 04/02/2019.     Review of Systems  GENERAL: No change in appetite, no fatigue, no weight changes, no fever, chills or weakness MOUTH and THROAT: Denies oral discomfort, gingival pain or bleeding, pain from teeth or hoarseness   RESPIRATORY: no cough, SOB, DOE, wheezing, hemoptysis CARDIAC: No chest pain, or palpitations GI: No abdominal pain, diarrhea, constipation, heart burn, nausea or vomiting GU: Denies dysuria, frequency, hematuria, incontinence, or discharge NEUROLOGICAL:  Denies dizziness, syncope, numbness, or headache PSYCHIATRIC: Denies feelings of depression or anxiety. No report of hallucinations, insomnia, paranoia, or agitation    Immunization History  Administered Date(s) Administered  . Pneumococcal Conjugate-13 02/12/2014  . Tdap 12/31/2008   Pertinent  Health Maintenance Due  Topic Date Due  . MAMMOGRAM  04/04/2019 (Originally 10/22/1995)  . OPHTHALMOLOGY EXAM  04/04/2019 (Originally 10/22/1955)  . URINE MICROALBUMIN  04/04/2019 (Originally 10/22/1955)  . DEXA SCAN  04/04/2019 (Originally 10/21/2010)  . PNA vac Low Risk Adult (2 of 2 - PPSV23) 04/04/2019 (Originally 02/13/2015)  . HEMOGLOBIN A1C  04/13/2019 (Originally 03/10/2019)  . INFLUENZA VACCINE  05/02/2019 (Originally 03/30/2019)  . FOOT EXAM  06/20/2019  . COLONOSCOPY  06/01/2023   Fall Risk  10/30/2018 07/11/2016 01/07/2016  Falls in the past year? 0 No No     Vitals:   04/02/19 1124  BP: 119/67  Pulse: 82  Resp: 18  Temp: 98.3 F (36.8 C)  TempSrc: Oral  SpO2: 94%  Weight: 189 lb (85.7 kg)  Height: 5\' 2"  (1.575 m)   Body  mass index is 34.57 kg/m.  Physical Exam  GENERAL APPEARANCE: Well nourished. In no acute distress. Obese SKIN:  Skin is warm and dry.  MOUTH and THROAT: Lips are without lesions. Oral mucosa is moist and without lesions. Tongue is normal in shape, size, and color and without lesions RESPIRATORY: Breathing is even & unlabored, BS CTAB CARDIAC: RRR, no murmur,no extra heart sounds, LLE 1+edema GI: Abdomen soft, normal BS, no masses, no tenderness EXTREMITIES:  Able to move X 4 extremities NEUROLOGICAL: There is no tremor. Speech is clear. Alert and oriented X 3. PSYCHIATRIC:  Affect and behavior are appropriate  Labs reviewed: Recent Labs    09/13/18 0447 09/14/18 0403 09/15/18 0502 09/15/18 0834 09/16/18 0359 09/17/18 0418  11/05/18 1028 11/28/18 0944 02/07/19 02/11/19 02/20/19 1442  NA 141 139 138  --  140 140   < > 145* 144 140 141 138  K 3.3* 3.0* 3.2*  --  3.5 3.7  --  4.5 4.0 4.6 4.1 5.1  CL 101 96* 94*  --  96* 94*  --  102 99  --   --  103  CO2 29 31 33*  --  34* 35*  --  23 25  --   --  25  GLUCOSE 179* 180* 182*  --  177* 204*  --  113* 119*  --   --  117*  BUN 51* 52* 57*  --  55* 57*   < > 29* 25 44* 23* 38*  CREATININE 1.22* 1.15* 1.40*  --  1.19* 1.22*  --  1.29* 1.17* 1.2* 1.1 1.26*  CALCIUM 8.3* 8.3* 8.0*  --  8.3* 8.4*  --  8.1* 7.7*  --   --  8.5*  MG 1.8 1.9  --  1.9  --   --   --   --   --   --   --   --   PHOS  --   --  4.1  --  3.5 3.6  --   --   --   --   --   --    < > = values in this interval not displayed.   Recent Labs    09/08/18 1609 09/09/18 0824  09/16/18 0359 09/17/18 0418 02/20/19 1442  AST 15 15  --   --   --  45*  ALT 10 8  --   --   --  27  ALKPHOS 77 63  --   --   --  83  BILITOT 0.8 0.2*  --   --   --  <0.2*  PROT 6.4* 5.7*  --   --   --  6.6  ALBUMIN 3.2* 2.7*   < > 2.9* 3.1* 3.0*   < > = values in this interval not displayed.   Recent Labs    09/13/18 0447 09/14/18 0403 11/28/18 0944 02/20/19 1442  WBC 7.4 7.1 9.4 6.0   NEUTROABS 4.9 4.8  --  4.1  HGB 9.1* 8.9* 11.4 10.7*  HCT 30.4* 29.4* 35.5 34.6*  MCV 91.0 91.0 84 86.7  PLT 285 258 401 269   Lab Results  Component Value Date   TSH 3.265 09/09/2018   Lab Results  Component Value Date   HGBA1C 7.1 (H) 09/09/2018     Assessment/Plan   1. Closed fracture of right ankle with routine healing, subsequent encounter - CAM boot has been discontinued, WBAT,will have home health PT and OT for therapeutic strengthening exercises, fall precautions  2. Hypothyroidism due to acquired atrophy of thyroid Lab Results  Component Value Date   TSH 3.265 09/09/2018   - levothyroxine (SYNTHROID) 50 MCG tablet; Take 1 tablet (50 mcg total) by mouth daily before breakfast.  Dispense: 30 tablet; Refill: 0  3. DM (diabetes mellitus), type 2 with complications (HCC) Lab Results  Component Value Date   HGBA1C 7.1 (H) 09/09/2018   - metFORMIN (GLUCOPHAGE) 500 MG tablet; Take 1 tablet (500 mg total) by mouth 2 (two) times daily with a meal.  Dispense: 60 tablet; Refill: 0 - ONETOUCH VERIO test strip; USE TO TEST BLOOD GLUCOSE ONCE DAILY  Dispense: 100 each  4. Hypertensive heart disease with chronic diastolic congestive heart failure (HCC) - metoprolol tartrate (LOPRESSOR) 25 MG tablet; Take 0.5 tablets (12.5 mg total) by mouth 2 (two) times daily. Take 0.5 tablet to = 12.5 mg if SBP >110  Dispense: 30 tablet; Refill: 0 - torsemide (DEMADEX) 20 MG tablet  5. Gastroesophageal reflux disease without esophagitis - pantoprazole (PROTONIX) 40 MG tablet; Take 1 tablet (40 mg total) by mouth daily.  Dispense: 30 tablet; Refill: 0  6. Hypokalemia - potassium chloride SA (K-DUR) 20 MEQ tablet; Take 1 tablet (20 mEq total) by mouth every Wednesdays  Dispense: 4 tablet; Refill: 0  7.  Chronic gout involving toe of right foot without tophus, unspecified cause - allopurinol (ZYLOPRIM) 100 MG tablet; Take 1 tablet (100 mg total) by mouth daily.  Dispense: 30 tablet; Refill:  0  8. Primary osteoarthritis of left knee - diclofenac sodium (VOLTAREN) 1 % GEL; Apply to left knee; Refill: 0  9. Lumbar radiculopathy - Lidocaine (ASPERCREME LIDOCAINE) 4 % PTCH; APPLY TOPICALLY TO LOWER BACK ONCE DAILY FOR PAIN  Dispense:    10. Anxiety -Continue Ativan 0.5 mg 1 tab twice daily PRN  11. Insomnia, unspecified type - Melatonin 3 MG TABS; Take 1 tablet (3 mg total) by mouth at bedtime. FOR INSOMNIA  Dispense:  ; Refill: 0  12. Nausea - ondansetron (ZOFRAN) 4 MG tablet; Take 1 tablet (4 mg total) by mouth every 6 (six) hours as needed for nausea or vomiting. GIVE 1 TABLET BY MOUTH EVERY 6 HOURS AS NEEDED FOR NAUSEA/VOMITING  Dispense: 20 tablet; Refill: 0  13. Dry eyes - polyvinyl alcohol (LIQUIFILM TEARS) 1.4 % ophthalmic solution; Place 1 drop into both eyes every 4 (four) hours as needed for dry eyes.  Dispense: 15 mL;  Refill: 0  14. Vitamin deficiency related neuropathy - vitamin B-12 (CYANOCOBALAMIN) 1000 MCG tablet PO daily    I have filled out patient's discharge paperwork and e-prescribed medications.  Patient will have home health PT, OT, Nursing and SW.  DME provided: standard wheelchair with elevating leg rests, height adjustable arms, brake extenders and seat cushion for one year or more   Patient requires a wheelchair for due to closed fracture of right ankle.  Patient has a mobility limitation that prevents her from completing ADLs.  She is unable to ambulate with an assistive device such as rolling walker, cane or crutch.  She can safely self propel a wheelchair.    Total discharge time: Greater than 30 minutes Greater than 50% ws spent in counseling and coordination of care.   Discharge time involved coordination of the discharge process with Education officer, museum and nursing staff. Medical justification for home health services verified.   Durenda Age, DNP, FNP-BC [facility] [phone] (Monday-Friday 8:00 a.m.  - 5:00 p.m.) [phone] (after hours)

## 2019-04-11 NOTE — Progress Notes (Signed)
Virtual Visit via Video Note The purpose of this virtual visit is to provide medical care while limiting exposure to the novel coronavirus.    Consent was obtained for video visit:  Yes.   Answered questions that patient had about telehealth interaction:  Yes.   I discussed the limitations, risks, security and privacy concerns of performing an evaluation and management service by telemedicine. I also discussed with the patient that there may be a patient responsible charge related to this service. The patient expressed understanding and agreed to proceed.  Pt location: Home Physician Location: office Name of referring provider:  [name], MD I connected with [name] at patients initiation/request on 04/12/2019 at  3:10 PM EDT by video enabled telemedicine application and verified that I am speaking with the correct person using two identifiers. Pt MRN:  [id number] Pt DOB:  June 18, 1946 Video Participants:  [name]   History of Present Illness: This is a 73 y.o. female with hypertension, hyperlipidemia, diabetes mellitus complicated by neuropathy, inflammatory polyarthropathy, CHF, hypothyroidism, and GERD referred for evaluation of progressive bilateral leg weakness.    In early January 2020 she developed severe leg edema and was admitted to the hospital for cellulitis of the legs.  She was discharged home and was living between her home and her sister's home. While living with her sister,she recalls standing up from her breakfast table in late April and her legs gave out causing her to land in the chair. The following week, her brother was assisting her from getting up out of a sofa, she again was unable to get up and fell back in the chair.  Her leg weakness continued to progress.  On 5/12, she was going down the step to leave her sister's home and her legs gave out.  She fractured her right ankle and twisted her left knee.  She was evaluated in the ER and discharged  to [facility] after being placed in an immobilizer.  She was getting PT at [facility] and discharged home last week. Per PCP note, there was note that her CK was elevated, however I do not have these results to review.   She continues to have ongoing weakness in the legs, especially with raising the legs.  She uses a walker and wheelchair for long distances. She has some weakness with raising the arms and opening jars/bottles.  She denies myalgias, dark-colored urine, muscle twitches, double vision, and cramps. No problems with swallowing or talking.  She was previously on statin therapy which was stopped.    She has lost 71lb fluid water from aggressive diuresis since early 2020.    She had a virtual visit with her primary care doctor on 04/08/2019, at which time repeat CK, CBC, CMP, hemoglobin A1c, and lipid panel was ordered.  She has not had this drawn yet.  Labs: Lab Results  Component Value Date   CREATININE 1.26 (H) 02/20/2019   BUN 38 (H) 02/20/2019   NA 138 02/20/2019   K 5.1 02/20/2019   CL 103 02/20/2019   CO2 25 02/20/2019   Lab Results  Component Value Date   ALT 27 02/20/2019   AST 45 (H) 02/20/2019   ALKPHOS 83 02/20/2019   BILITOT <0.2 (L) 02/20/2019   Lab Results  Component Value Date   TROPONINI <0.03 09/09/2018   Lab Results  Component Value Date   TSH 3.265 09/09/2018   Lab Results  Component Value Date   ESRSEDRATE 72 02/08/2019  Observations/Objective:   Patient is awake, alert, and appears comfortable.  Oriented x 4.   Extraocular muscles are intact. No ptosis.  Face is symmetric.  Speech is not dysarthric. Tongue is midline. Antigravity in the arms.  There is no atrophy of intrinsic hand muscles.  No pronator drift.    Leg testing is limited due to weakness.  She is unable to raise her knees from a seated position.  Knee extension is full.  Knee flexion is poor.  Distal strength ROM of dorsiflexion, plantarflexion, and toe extension/flexion is intact.   Gait was not tested  Assessment and Plan:  Progressive proximal bilateral leg weakness.  If CK is elevated, this would support myopathy i(inflammatory/autoimme vs inclusion body), if mildly elevated or normal need to consider alternative disorders (antierior horn cell, radiculopathy, etc).  EDX will help narrow the possibilities.  - NCS/EMG of the arm and leg (worse side) - myopathy protocol  - Check ESR, CRP, CK, aldolase, myositis panel, SPEP with IFE - we will arrange this with her PCP lab visit on 8/25  - If labs and EDX is consistent with myopathy, muscle biopsy would be the next step  - Stay off statin therapy   - Return to clinic for formal neuromuscular exam   Follow Up Instructions:   I discussed the assessment and treatment plan with the patient. The patient was provided an opportunity to ask questions and all were answered. The patient agreed with the plan and demonstrated an understanding of the instructions.   The patient was advised to call back or seek an in-person evaluation if the symptoms worsen or if the condition fails to improve as anticipated.  Total time spent:  40 minutes     Alda Berthold, DO

## 2019-04-12 ENCOUNTER — Other Ambulatory Visit: Payer: Self-pay

## 2019-04-12 ENCOUNTER — Telehealth (INDEPENDENT_AMBULATORY_CARE_PROVIDER_SITE_OTHER): Payer: Medicare Other | Admitting: Neurology

## 2019-04-12 ENCOUNTER — Encounter: Payer: Self-pay | Admitting: Neurology

## 2019-04-12 VITALS — Ht 62.5 in | Wt 180.0 lb

## 2019-04-12 DIAGNOSIS — R748 Abnormal levels of other serum enzymes: Secondary | ICD-10-CM

## 2019-04-12 DIAGNOSIS — R29898 Other symptoms and signs involving the musculoskeletal system: Secondary | ICD-10-CM

## 2019-04-16 ENCOUNTER — Telehealth: Payer: Self-pay

## 2019-04-16 ENCOUNTER — Other Ambulatory Visit: Payer: Self-pay

## 2019-04-16 DIAGNOSIS — R29898 Other symptoms and signs involving the musculoskeletal system: Secondary | ICD-10-CM

## 2019-04-16 NOTE — Telephone Encounter (Signed)
Ordered labs and spoke with patient and Dr.Shaw office they will draw labs, entered into lab request

## 2019-04-16 NOTE — Telephone Encounter (Signed)
-----   Message from Alda Berthold, DO sent at 04/16/2019  1:18 PM EDT ----- Please order labs as noted below - the challenge is that we need to coordinate it with her PCP lab visit on 8/25.  Patient has severe weakness and was told that her PCP's phlebotomist/nurse will come downstairs when her ride brings her, to draw the blood work.  We need to contact their office and see if this is true - if so, they also draw what I am requesting. ----- Message ----- From: Alda Berthold, DO Sent: 04/12/2019   9:32 PM EDT To: Alda Berthold, DO

## 2019-04-16 NOTE — Telephone Encounter (Signed)
Arranged labs to be ordered for patient at [facility] office, ordered labs to be done with her PCP

## 2019-04-24 ENCOUNTER — Other Ambulatory Visit: Payer: Self-pay | Admitting: Adult Health

## 2019-04-24 DIAGNOSIS — E876 Hypokalemia: Secondary | ICD-10-CM

## 2019-04-26 ENCOUNTER — Other Ambulatory Visit: Payer: Self-pay | Admitting: Adult Health

## 2019-04-26 DIAGNOSIS — G4700 Insomnia, unspecified: Secondary | ICD-10-CM

## 2019-04-26 DIAGNOSIS — E569 Vitamin deficiency, unspecified: Secondary | ICD-10-CM

## 2019-04-29 ENCOUNTER — Telehealth: Payer: Self-pay | Admitting: Neurology

## 2019-04-29 NOTE — Telephone Encounter (Signed)
Patient called for her recent blood test results.

## 2019-04-29 NOTE — Telephone Encounter (Signed)
Dr.Shaw office to send lab results over

## 2019-04-30 ENCOUNTER — Other Ambulatory Visit: Payer: Self-pay

## 2019-04-30 ENCOUNTER — Telehealth: Payer: Self-pay | Admitting: Neurology

## 2019-04-30 DIAGNOSIS — E0842 Diabetes mellitus due to underlying condition with diabetic polyneuropathy: Secondary | ICD-10-CM

## 2019-04-30 DIAGNOSIS — R748 Abnormal levels of other serum enzymes: Secondary | ICD-10-CM

## 2019-04-30 DIAGNOSIS — R29898 Other symptoms and signs involving the musculoskeletal system: Secondary | ICD-10-CM

## 2019-04-30 NOTE — Telephone Encounter (Signed)
Labs 04/23/2019 from PCP's office: Hemoglobin A1c 6.4, glucose 117, creatinine 0.86, sodium 142, potassium 4.0, chloride 105, calcium 8.9, protein 6.0, ALT T 14, AST 22, TSH 2.58, CK-MB 24  Labs that I had requested were not drawn.  She will return to the office on 9/22 for EMG which is scheduled and will have labs drawn prior to her testing.  Patient has been notified of this.

## 2019-05-04 ENCOUNTER — Other Ambulatory Visit: Payer: Self-pay | Admitting: Adult Health

## 2019-05-04 DIAGNOSIS — E569 Vitamin deficiency, unspecified: Secondary | ICD-10-CM

## 2019-05-04 DIAGNOSIS — G63 Polyneuropathy in diseases classified elsewhere: Secondary | ICD-10-CM

## 2019-05-04 DIAGNOSIS — E118 Type 2 diabetes mellitus with unspecified complications: Secondary | ICD-10-CM

## 2019-05-13 ENCOUNTER — Encounter (HOSPITAL_COMMUNITY)
Admission: EM | Disposition: A | Discharge status: Skilled Nursing Facility | Payer: Self-pay | Source: Home / Self Care | Attending: Cardiothoracic Surgery

## 2019-05-13 ENCOUNTER — Inpatient Hospital Stay (HOSPITAL_COMMUNITY): Payer: Medicare Other

## 2019-05-13 ENCOUNTER — Other Ambulatory Visit: Payer: Self-pay

## 2019-05-13 ENCOUNTER — Encounter (HOSPITAL_COMMUNITY): Payer: Self-pay | Admitting: Emergency Medicine

## 2019-05-13 ENCOUNTER — Emergency Department (HOSPITAL_COMMUNITY): Payer: Medicare Other

## 2019-05-13 ENCOUNTER — Inpatient Hospital Stay (HOSPITAL_COMMUNITY)
Admission: EM | Admit: 2019-05-13 | Discharge: 2019-05-29 | DRG: 233 | Disposition: A | Discharge status: Skilled Nursing Facility | Payer: Medicare Other | Attending: Cardiothoracic Surgery | Admitting: Cardiothoracic Surgery

## 2019-05-13 DIAGNOSIS — Z09 Encounter for follow-up examination after completed treatment for conditions other than malignant neoplasm: Secondary | ICD-10-CM

## 2019-05-13 DIAGNOSIS — G4733 Obstructive sleep apnea (adult) (pediatric): Secondary | ICD-10-CM | POA: Diagnosis present

## 2019-05-13 DIAGNOSIS — Z880 Allergy status to penicillin: Secondary | ICD-10-CM

## 2019-05-13 DIAGNOSIS — D62 Acute posthemorrhagic anemia: Secondary | ICD-10-CM | POA: Diagnosis not present

## 2019-05-13 DIAGNOSIS — R9431 Abnormal electrocardiogram [ECG] [EKG]: Secondary | ICD-10-CM | POA: Diagnosis not present

## 2019-05-13 DIAGNOSIS — N189 Chronic kidney disease, unspecified: Secondary | ICD-10-CM | POA: Diagnosis present

## 2019-05-13 DIAGNOSIS — I252 Old myocardial infarction: Secondary | ICD-10-CM

## 2019-05-13 DIAGNOSIS — K219 Gastro-esophageal reflux disease without esophagitis: Secondary | ICD-10-CM | POA: Diagnosis present

## 2019-05-13 DIAGNOSIS — Z885 Allergy status to narcotic agent status: Secondary | ICD-10-CM

## 2019-05-13 DIAGNOSIS — Z961 Presence of intraocular lens: Secondary | ICD-10-CM | POA: Diagnosis present

## 2019-05-13 DIAGNOSIS — Z87892 Personal history of anaphylaxis: Secondary | ICD-10-CM

## 2019-05-13 DIAGNOSIS — I130 Hypertensive heart and chronic kidney disease with heart failure and stage 1 through stage 4 chronic kidney disease, or unspecified chronic kidney disease: Secondary | ICD-10-CM | POA: Diagnosis present

## 2019-05-13 DIAGNOSIS — I5043 Acute on chronic combined systolic (congestive) and diastolic (congestive) heart failure: Secondary | ICD-10-CM | POA: Diagnosis present

## 2019-05-13 DIAGNOSIS — E1142 Type 2 diabetes mellitus with diabetic polyneuropathy: Secondary | ICD-10-CM | POA: Diagnosis present

## 2019-05-13 DIAGNOSIS — R072 Precordial pain: Secondary | ICD-10-CM | POA: Diagnosis present

## 2019-05-13 DIAGNOSIS — I214 Non-ST elevation (NSTEMI) myocardial infarction: Principal | ICD-10-CM | POA: Diagnosis present

## 2019-05-13 DIAGNOSIS — E669 Obesity, unspecified: Secondary | ICD-10-CM | POA: Diagnosis present

## 2019-05-13 DIAGNOSIS — Z7984 Long term (current) use of oral hypoglycemic drugs: Secondary | ICD-10-CM

## 2019-05-13 DIAGNOSIS — Z9841 Cataract extraction status, right eye: Secondary | ICD-10-CM

## 2019-05-13 DIAGNOSIS — E039 Hypothyroidism, unspecified: Secondary | ICD-10-CM | POA: Diagnosis present

## 2019-05-13 DIAGNOSIS — Z8371 Family history of colonic polyps: Secondary | ICD-10-CM

## 2019-05-13 DIAGNOSIS — Z886 Allergy status to analgesic agent status: Secondary | ICD-10-CM

## 2019-05-13 DIAGNOSIS — F419 Anxiety disorder, unspecified: Secondary | ICD-10-CM | POA: Diagnosis present

## 2019-05-13 DIAGNOSIS — E1122 Type 2 diabetes mellitus with diabetic chronic kidney disease: Secondary | ICD-10-CM | POA: Diagnosis present

## 2019-05-13 DIAGNOSIS — I2510 Atherosclerotic heart disease of native coronary artery without angina pectoris: Secondary | ICD-10-CM

## 2019-05-13 DIAGNOSIS — I447 Left bundle-branch block, unspecified: Secondary | ICD-10-CM

## 2019-05-13 DIAGNOSIS — I4891 Unspecified atrial fibrillation: Secondary | ICD-10-CM | POA: Diagnosis present

## 2019-05-13 DIAGNOSIS — Z9842 Cataract extraction status, left eye: Secondary | ICD-10-CM | POA: Diagnosis not present

## 2019-05-13 DIAGNOSIS — I249 Acute ischemic heart disease, unspecified: Secondary | ICD-10-CM

## 2019-05-13 DIAGNOSIS — Z79899 Other long term (current) drug therapy: Secondary | ICD-10-CM

## 2019-05-13 DIAGNOSIS — I509 Heart failure, unspecified: Secondary | ICD-10-CM

## 2019-05-13 DIAGNOSIS — E1100 Type 2 diabetes mellitus with hyperosmolarity without nonketotic hyperglycemic-hyperosmolar coma (NKHHC): Secondary | ICD-10-CM | POA: Diagnosis not present

## 2019-05-13 DIAGNOSIS — D696 Thrombocytopenia, unspecified: Secondary | ICD-10-CM | POA: Diagnosis present

## 2019-05-13 DIAGNOSIS — N183 Chronic kidney disease, stage 3 (moderate): Secondary | ICD-10-CM | POA: Diagnosis present

## 2019-05-13 DIAGNOSIS — M79662 Pain in left lower leg: Secondary | ICD-10-CM

## 2019-05-13 DIAGNOSIS — Z01810 Encounter for preprocedural cardiovascular examination: Secondary | ICD-10-CM | POA: Diagnosis not present

## 2019-05-13 DIAGNOSIS — Z951 Presence of aortocoronary bypass graft: Secondary | ICD-10-CM

## 2019-05-13 DIAGNOSIS — I25110 Atherosclerotic heart disease of native coronary artery with unstable angina pectoris: Secondary | ICD-10-CM | POA: Diagnosis not present

## 2019-05-13 DIAGNOSIS — Z90711 Acquired absence of uterus with remaining cervical stump: Secondary | ICD-10-CM

## 2019-05-13 DIAGNOSIS — Z66 Do not resuscitate: Secondary | ICD-10-CM | POA: Diagnosis present

## 2019-05-13 DIAGNOSIS — Z791 Long term (current) use of non-steroidal anti-inflammatories (NSAID): Secondary | ICD-10-CM

## 2019-05-13 DIAGNOSIS — Z9689 Presence of other specified functional implants: Secondary | ICD-10-CM

## 2019-05-13 DIAGNOSIS — Z87891 Personal history of nicotine dependence: Secondary | ICD-10-CM

## 2019-05-13 DIAGNOSIS — Z9889 Other specified postprocedural states: Secondary | ICD-10-CM

## 2019-05-13 DIAGNOSIS — I5023 Acute on chronic systolic (congestive) heart failure: Secondary | ICD-10-CM | POA: Diagnosis not present

## 2019-05-13 DIAGNOSIS — Z20828 Contact with and (suspected) exposure to other viral communicable diseases: Secondary | ICD-10-CM | POA: Diagnosis present

## 2019-05-13 DIAGNOSIS — Z79890 Hormone replacement therapy (postmenopausal): Secondary | ICD-10-CM

## 2019-05-13 DIAGNOSIS — Z882 Allergy status to sulfonamides status: Secondary | ICD-10-CM

## 2019-05-13 HISTORY — PX: LEFT HEART CATH AND CORONARY ANGIOGRAPHY: CATH118249

## 2019-05-13 LAB — GLUCOSE, CAPILLARY
Glucose-Capillary: 103 mg/dL — ABNORMAL HIGH (ref 70–99)
Glucose-Capillary: 131 mg/dL — ABNORMAL HIGH (ref 70–99)

## 2019-05-13 LAB — COMPREHENSIVE METABOLIC PANEL WITH GFR
ALT: 14 U/L (ref 0–44)
AST: 22 U/L (ref 15–41)
Albumin: 3.2 g/dL — ABNORMAL LOW (ref 3.5–5.0)
Alkaline Phosphatase: 68 U/L (ref 38–126)
Anion gap: 13 (ref 5–15)
BUN: 23 mg/dL (ref 8–23)
CO2: 23 mmol/L (ref 22–32)
Calcium: 8.9 mg/dL (ref 8.9–10.3)
Chloride: 107 mmol/L (ref 98–111)
Creatinine, Ser: 0.82 mg/dL (ref 0.44–1.00)
GFR calc Af Amer: >60 mL/min (ref >60)
GFR calc non Af Amer: >60 mL/min (ref >60)
Glucose, Bld: 158 mg/dL — ABNORMAL HIGH (ref 70–99)
Potassium: 3.6 mmol/L (ref 3.5–5.1)
Sodium: 143 mmol/L (ref 135–145)
Total Bilirubin: 0.4 mg/dL (ref 0.3–1.2)
Total Protein: 7 g/dL (ref 6.5–8.1)

## 2019-05-13 LAB — CBC
HCT: 36.5 % (ref 36.0–46.0)
Hemoglobin: 10.9 g/dL — ABNORMAL LOW (ref 12.0–15.0)
MCH: 26.9 pg (ref 26.0–34.0)
MCHC: 29.9 g/dL — ABNORMAL LOW (ref 30.0–36.0)
MCV: 90.1 fL (ref 80.0–100.0)
Platelets: 373 10*3/uL (ref 150–400)
RBC: 4.05 MIL/uL (ref 3.87–5.11)
RDW: 15.5 % (ref 11.5–15.5)
WBC: 8.2 10*3/uL (ref 4.0–10.5)
nRBC: 0 % (ref 0.0–0.2)

## 2019-05-13 LAB — HEMOGLOBIN AND HEMATOCRIT, BLOOD
HCT: 30.4 % — ABNORMAL LOW (ref 36.0–46.0)
Hemoglobin: 9.2 g/dL — ABNORMAL LOW (ref 12.0–15.0)

## 2019-05-13 LAB — LACTIC ACID, PLASMA: Lactic Acid, Venous: 0.6 mmol/L (ref 0.5–1.9)

## 2019-05-13 LAB — SARS CORONAVIRUS 2 BY RT PCR (HOSPITAL ORDER, PERFORMED IN ~~LOC~~ HOSPITAL LAB): SARS Coronavirus 2: NEGATIVE

## 2019-05-13 LAB — ECHOCARDIOGRAM COMPLETE
Height: 63 in
Weight: 2880 oz

## 2019-05-13 LAB — TROPONIN I (HIGH SENSITIVITY)
Troponin I (High Sensitivity): 431 ng/L (ref <18)
Troponin I (High Sensitivity): 3549 ng/L (ref <18)

## 2019-05-13 LAB — MRSA PCR SCREENING: MRSA by PCR: NEGATIVE

## 2019-05-13 SURGERY — LEFT HEART CATH AND CORONARY ANGIOGRAPHY
Anesthesia: LOCAL

## 2019-05-13 MED ORDER — HEPARIN SODIUM (PORCINE) 1000 UNIT/ML IJ SOLN
INTRAMUSCULAR | Status: DC | PRN
  Administered 2019-05-13: 4000 [IU] via INTRAVENOUS

## 2019-05-13 MED ORDER — MUPIROCIN 2 % EX OINT
1.0000 "application " | TOPICAL_OINTMENT | Freq: Two times a day (BID) | CUTANEOUS | Status: DC
  Administered 2019-05-13 – 2019-05-14 (×3): 1 via NASAL
  Filled 2019-05-13 (×2): qty 22

## 2019-05-13 MED ORDER — SODIUM CHLORIDE 0.9% FLUSH: 3.0000 mL | INTRAVENOUS | Status: DC | PRN

## 2019-05-13 MED ORDER — ATORVASTATIN CALCIUM 80 MG PO TABS
80.0000 mg | ORAL_TABLET | Freq: Every day | ORAL | Status: DC
  Administered 2019-05-13 – 2019-05-14 (×2): 80 mg via ORAL
  Filled 2019-05-13 (×2): qty 1

## 2019-05-13 MED ORDER — FENTANYL CITRATE (PF) 100 MCG/2ML IJ SOLN
INTRAMUSCULAR | Status: DC | PRN
  Administered 2019-05-13: 25 ug via INTRAVENOUS

## 2019-05-13 MED ORDER — SODIUM CHLORIDE 0.9 % WEIGHT BASED INFUSION: 3.0000 mL/kg/h | INTRAVENOUS | Status: DC

## 2019-05-13 MED ORDER — ONDANSETRON HCL 4 MG/2ML IJ SOLN
4.0000 mg | Freq: Once | INTRAMUSCULAR | Status: AC
  Administered 2019-05-13: 4 mg via INTRAVENOUS
  Filled 2019-05-13: qty 2

## 2019-05-13 MED ORDER — MIDAZOLAM HCL 2 MG/2ML IJ SOLN
INTRAMUSCULAR | Status: AC
  Filled 2019-05-13: qty 2

## 2019-05-13 MED ORDER — PANTOPRAZOLE SODIUM 40 MG PO TBEC
40.0000 mg | DELAYED_RELEASE_TABLET | Freq: Every day | ORAL | Status: DC
  Administered 2019-05-14: 09:00:00 40 mg via ORAL
  Filled 2019-05-13: qty 1

## 2019-05-13 MED ORDER — HEPARIN (PORCINE) 25000 UT/250ML-% IV SOLN
900.0000 [IU]/h | INTRAVENOUS | Status: DC
  Administered 2019-05-13: 900 [IU]/h via INTRAVENOUS
  Filled 2019-05-13: qty 250

## 2019-05-13 MED ORDER — MIDAZOLAM HCL 2 MG/2ML IJ SOLN
INTRAMUSCULAR | Status: DC | PRN
  Administered 2019-05-13: 1 mg via INTRAVENOUS

## 2019-05-13 MED ORDER — LEVOTHYROXINE SODIUM 50 MCG PO TABS: 50.0000 ug | ORAL_TABLET | Freq: Every day | ORAL | Status: DC

## 2019-05-13 MED ORDER — HEPARIN SODIUM (PORCINE) 1000 UNIT/ML IJ SOLN
INTRAMUSCULAR | Status: AC
  Filled 2019-05-13: qty 1

## 2019-05-13 MED ORDER — INSULIN ASPART 100 UNIT/ML ~~LOC~~ SOLN: 0.0000 [IU] | Freq: Three times a day (TID) | SUBCUTANEOUS | Status: DC

## 2019-05-13 MED ORDER — FENTANYL CITRATE (PF) 100 MCG/2ML IJ SOLN
INTRAMUSCULAR | Status: AC
  Filled 2019-05-13: qty 2

## 2019-05-13 MED ORDER — SODIUM CHLORIDE 0.9% FLUSH
3.0000 mL | Freq: Two times a day (BID) | INTRAVENOUS | Status: DC
  Administered 2019-05-14 (×2): 3 mL via INTRAVENOUS

## 2019-05-13 MED ORDER — HEPARIN (PORCINE) IN NACL 1000-0.9 UT/500ML-% IV SOLN
INTRAVENOUS | Status: AC
  Filled 2019-05-13: qty 1000

## 2019-05-13 MED ORDER — SODIUM CHLORIDE 0.9 % IV SOLN: 250.0000 mL | INTRAVENOUS | Status: DC | PRN

## 2019-05-13 MED ORDER — METOPROLOL TARTRATE 12.5 MG HALF TABLET
12.5000 mg | ORAL_TABLET | Freq: Two times a day (BID) | ORAL | Status: DC
  Administered 2019-05-14: 12.5 mg via ORAL
  Filled 2019-05-13: qty 1

## 2019-05-13 MED ORDER — ACETAMINOPHEN 325 MG PO TABS
650.0000 mg | ORAL_TABLET | ORAL | Status: DC | PRN
  Administered 2019-05-13 – 2019-05-29 (×10): 650 mg via ORAL
  Filled 2019-05-13 (×10): qty 2

## 2019-05-13 MED ORDER — NITROGLYCERIN IN D5W 200-5 MCG/ML-% IV SOLN: 0.0000 ug/min | INTRAVENOUS | Status: DC

## 2019-05-13 MED ORDER — VERAPAMIL HCL 2.5 MG/ML IV SOLN
INTRAVENOUS | Status: DC | PRN
  Administered 2019-05-13: 10 mL via INTRA_ARTERIAL

## 2019-05-13 MED ORDER — HEPARIN BOLUS VIA INFUSION
4000.0000 [IU] | Freq: Once | INTRAVENOUS | Status: AC
  Administered 2019-05-13: 4000 [IU] via INTRAVENOUS
  Filled 2019-05-13: qty 4000

## 2019-05-13 MED ORDER — TORSEMIDE 20 MG PO TABS
10.0000 mg | ORAL_TABLET | Freq: Every day | ORAL | Status: DC
  Administered 2019-05-14: 10 mg via ORAL
  Filled 2019-05-13: qty 1

## 2019-05-13 MED ORDER — CLOPIDOGREL BISULFATE 300 MG PO TABS
300.0000 mg | ORAL_TABLET | Freq: Once | ORAL | Status: AC
  Administered 2019-05-13: 300 mg via ORAL
  Filled 2019-05-13: qty 1

## 2019-05-13 MED ORDER — FUROSEMIDE 10 MG/ML IJ SOLN
20.0000 mg | Freq: Once | INTRAMUSCULAR | Status: AC
  Administered 2019-05-13: 16:00:00 20 mg via INTRAVENOUS
  Filled 2019-05-13: qty 2

## 2019-05-13 MED ORDER — HEPARIN (PORCINE) 25000 UT/250ML-% IV SOLN
1250.0000 [IU]/h | INTRAVENOUS | Status: DC
  Administered 2019-05-13: 23:00:00 900 [IU]/h via INTRAVENOUS
  Administered 2019-05-14: 1100 [IU]/h via INTRAVENOUS
  Filled 2019-05-13: qty 250

## 2019-05-13 MED ORDER — SODIUM CHLORIDE 0.9 % WEIGHT BASED INFUSION
1.0000 mL/kg/h | INTRAVENOUS | Status: AC
  Administered 2019-05-13: 1 mL/kg/h via INTRAVENOUS

## 2019-05-13 MED ORDER — ONDANSETRON HCL 4 MG/2ML IJ SOLN
4.0000 mg | Freq: Four times a day (QID) | INTRAMUSCULAR | Status: DC | PRN
  Administered 2019-05-14: 18:00:00 4 mg via INTRAVENOUS
  Filled 2019-05-13: qty 2

## 2019-05-13 MED ORDER — HEPARIN (PORCINE) IN NACL 1000-0.9 UT/500ML-% IV SOLN
INTRAVENOUS | Status: DC | PRN
  Administered 2019-05-13 (×2): 500 mL

## 2019-05-13 MED ORDER — MORPHINE SULFATE (PF) 4 MG/ML IV SOLN
4.0000 mg | Freq: Once | INTRAVENOUS | Status: AC
  Administered 2019-05-13: 4 mg via INTRAVENOUS
  Filled 2019-05-13: qty 1

## 2019-05-13 MED ORDER — LIDOCAINE HCL (PF) 1 % IJ SOLN
INTRAMUSCULAR | Status: AC
  Filled 2019-05-13: qty 30

## 2019-05-13 MED ORDER — VERAPAMIL HCL 2.5 MG/ML IV SOLN
INTRAVENOUS | Status: AC
  Filled 2019-05-13: qty 2

## 2019-05-13 MED ORDER — TIROFIBAN HCL IN NACL 5-0.9 MG/100ML-% IV SOLN
0.1500 ug/kg/min | INTRAVENOUS | Status: DC
  Administered 2019-05-13 – 2019-05-15 (×6): 0.15 ug/kg/min via INTRAVENOUS
  Filled 2019-05-13 (×6): qty 100

## 2019-05-13 MED ORDER — SODIUM CHLORIDE 0.9 % WEIGHT BASED INFUSION
1.0000 mL/kg/h | INTRAVENOUS | Status: DC
  Administered 2019-05-13: 14:00:00 0.123 mL/kg/h via INTRAVENOUS

## 2019-05-13 MED ORDER — LIDOCAINE HCL (PF) 1 % IJ SOLN
INTRAMUSCULAR | Status: DC | PRN
  Administered 2019-05-13: 2 mL via INTRADERMAL

## 2019-05-13 MED ORDER — SODIUM CHLORIDE 0.9 % IV BOLUS
500.0000 mL | Freq: Once | INTRAVENOUS | Status: AC
  Administered 2019-05-13: 500 mL via INTRAVENOUS

## 2019-05-13 MED ORDER — CHLORHEXIDINE GLUCONATE CLOTH 2 % EX PADS
6.0000 | MEDICATED_PAD | Freq: Every day | CUTANEOUS | Status: DC
  Administered 2019-05-14: 6 via TOPICAL

## 2019-05-13 MED ORDER — SODIUM CHLORIDE 0.9% FLUSH
3.0000 mL | Freq: Two times a day (BID) | INTRAVENOUS | Status: DC
  Administered 2019-05-14: 3 mL via INTRAVENOUS

## 2019-05-13 MED ORDER — PERFLUTREN LIPID MICROSPHERE
1.0000 mL | INTRAVENOUS | Status: AC | PRN
  Administered 2019-05-13: 2 mL via INTRAVENOUS
  Filled 2019-05-13: qty 10

## 2019-05-13 MED ORDER — LEVOTHYROXINE SODIUM 50 MCG PO TABS
50.0000 ug | ORAL_TABLET | Freq: Every day | ORAL | Status: DC
  Administered 2019-05-14: 50 ug via ORAL
  Filled 2019-05-13: qty 1

## 2019-05-13 MED ORDER — NITROGLYCERIN 0.4 MG SL SUBL: 0.4000 mg | SUBLINGUAL_TABLET | SUBLINGUAL | Status: DC | PRN

## 2019-05-13 MED ORDER — NITROGLYCERIN 2 % TD OINT
0.5000 [in_us] | TOPICAL_OINTMENT | Freq: Four times a day (QID) | TRANSDERMAL | Status: DC
  Administered 2019-05-13: 0.5 [in_us] via TOPICAL
  Filled 2019-05-13: qty 1

## 2019-05-13 MED ORDER — IOHEXOL 350 MG/ML SOLN
INTRAVENOUS | Status: DC | PRN
  Administered 2019-05-13: 15:00:00 80 mL via INTRACARDIAC

## 2019-05-13 SURGICAL SUPPLY — 12 items
CATH 5FR JL3.5 JR4 ANG PIG MP (CATHETERS) ×2 IMPLANT
CATH LAUNCHER 5F RADR (CATHETERS) ×1 IMPLANT
CATHETER LAUNCHER 5F RADR (CATHETERS) ×2
DEVICE RAD COMP TR BAND LRG (VASCULAR PRODUCTS) ×2 IMPLANT
GLIDESHEATH SLEND SS 6F .021 (SHEATH) ×2 IMPLANT
GUIDEWIRE INQWIRE 1.5J.035X260 (WIRE) ×1 IMPLANT
INQWIRE 1.5J .035X260CM (WIRE) ×2
KIT HEART LEFT (KITS) ×2 IMPLANT
PACK CARDIAC CATHETERIZATION (CUSTOM PROCEDURE TRAY) ×2 IMPLANT
SHEATH PROBE COVER 6X72 (BAG) ×2 IMPLANT
TRANSDUCER W/STOPCOCK (MISCELLANEOUS) ×2 IMPLANT
TUBING CIL FLEX 10 FLL-RA (TUBING) ×2 IMPLANT

## 2019-05-13 NOTE — ED Triage Notes (Signed)
Pt arrives to ED from home with complaints of an episode of centralized chest pain yesterday and today. Patient states with each episode of chest pain she will get nauseas and have and bout of emesis. Patient has CP at this time 5/10. No meds given by EMS.

## 2019-05-13 NOTE — Interval H&P Note (Signed)
History and Physical Interval Note:  05/13/2019 1:50 PM  Megan [name]  has presented today for surgery, with the diagnosis of nonstemi.  The various methods of treatment have been discussed with the patient and family. After consideration of risks, benefits and other options for treatment, the patient has consented to  Procedure(s): LEFT HEART CATH AND CORONARY ANGIOGRAPHY (N/A) as a surgical intervention.  The patient's history has been reviewed, patient examined, no change in status, stable for surgery.  I have reviewed the patient's chart and labs.  Questions were answered to the patient's satisfaction.   Cath Lab Visit (complete for each Cath Lab visit)  Clinical Evaluation Leading to the Procedure:   ACS: Yes.    Non-ACS:    Anginal Classification: CCS IV  Anti-ischemic medical therapy: Minimal Therapy (1 class of medications)  Non-Invasive Test Results: No non-invasive testing performed  Prior CABG: No previous CABG        Collier [facility] 05/13/2019 1:50 PM'

## 2019-05-13 NOTE — ED Provider Notes (Signed)
[facility] DEPART[facility] Provider Note   CSN: [id number] Arrival date & time: 05/13/19  0744     History   Chief Complaint Chief Complaint  Patient presents with  . Chest Pain    HPI OK Megan [name] is a 73 y.o. female.     Patient c/o diarrhea acute onset yesterday, 3-4 episodes, loose to watery, not bloody. Then this AM, around 4 AM, acute onset mid chest pain, pain constant, dull, non radiating, without specific exacerbating or alleviating factors, not pleuritic. +nausea/emesis x 1 this AM, not bloody. No abd pain. Denies diaphoresis or sob. No leg pain. Chronic, symmetric bil lower leg/ankle edema, no recent change. Denies cough or uri symptoms. No fever or chills. No known covid+ exposure.   The history is provided by the patient and the EMS personnel.  Chest Pain Associated symptoms: vomiting   Associated symptoms: no abdominal pain, no back pain, no cough, no fever, no headache and no shortness of breath     Past Medical History:  Diagnosis Date  . Allergy    bee stings  . Anxiety   . Asthmatic bronchitis   . Broken arm 1957/2008   right  . Cataract    had surgery   . Depression   . Diabetes mellitus without complication ([place])    prediabetes diet controlled  . Diverticulosis   . GERD (gastroesophageal reflux disease)   . Glaucoma    BIL  . Hemorrhoids   . Hx of adenomatous colonic polyps 12/02/2010  . Hyperlipidemia   . Hypertension   . Hypothyroidism 05/2018  . Inflammatory arthritis   . Knee bursitis   . Migraines   . Obesity   . Osteoarthritis   . Sleep apnea     Patient Active Problem List   Diagnosis Date Noted  . Nausea 04/02/2019  . Dry eyes 04/02/2019  . Insomnia 03/13/2019  . Hypotension 03/05/2019  . Muscle spasm 02/28/2019  . Osteoarthritis of left knee 02/28/2019  . Gout 02/16/2019  . Hypothyroidism 02/16/2019  . GERD (gastroesophageal reflux disease) 02/16/2019  . Anxiety 02/16/2019  . Hypokalemia  02/16/2019  . Hyperuricemia 02/04/2019  . Dysphagia 01/16/2019  . Closed right ankle fracture 01/16/2019  . Generalized weakness 01/16/2019  . CKD (chronic kidney disease) stage 3, GFR 30-59 ml/min ([facility]) 11/26/2018  . Cellulitis of lower extremity   . Diastolic CHF ([place]) XX123456  . DM (diabetes mellitus), type 2 with complications ([place]) XX123456  . Hypertensive heart disease with CHF (congestive heart failure) ([place]) 09/08/2018  . Leg edema 09/08/2018  . Blood in stool 09/08/2018  . OSA (obstructive sleep apnea) 09/08/2018  . Sensorineural hearing loss (SNHL) of both ears 07/31/2017  . Lumbar radiculopathy 12/05/2016  . Diabetic polyneuropathy associated with diabetes mellitus due to underlying condition ([place]) 07/09/2015  . Vitamin deficiency related neuropathy 07/09/2015  . Hx of adenomatous colonic polyps 12/02/2010    Past Surgical History:  Procedure Laterality Date  . CARPAL TUNNEL RELEASE Left    left wrist  . CATARACT EXTRACTION W/ INTRAOCULAR LENS  IMPLANT, BILATERAL Bilateral    1999 left, 2008 right  . COLONOSCOPY    . EXCISION MORTON'S NEUROMA Left 1978   left foot  . GLAUCOMA SURGERY Bilateral    and laser surgery, 2004 left, 2008 right  . PARTIAL HYSTERECTOMY  1991   Left an ovary  . POLYPECTOMY    . TOENAIL AVULSION Right    big toe     OB History  No obstetric history on file.      Home Medications    Prior to Admission medications   Medication Sig Start Date End Date Taking? Authorizing Provider  Acetaminophen (TYLENOL ARTHRITIS PAIN PO) Take 650 mg by mouth every 8 (eight) hours.     [provider]  allopurinol (ZYLOPRIM) 100 MG tablet Take 1 tablet (100 mg total) by mouth daily. 04/02/19   Medina-Vargas, Monina C, NP  bisacodyl (DULCOLAX) 10 MG suppository If not relieved by MOM, give 10 mg Bisacodyl suppositiory rectally X 1 dose in 24 hours as needed (Do not use constipation standing orders for residents with renal failure/CFR less  than 30. Contact MD for orders) (Physician Order) 04/02/19   Medina-Vargas, Jaymes Graff C, NP  diclofenac sodium (VOLTAREN) 1 % GEL Apply 2 gm topically to left knee 04/02/19   Medina-Vargas, Monina C, NP  levothyroxine (SYNTHROID) 50 MCG tablet Take 1 tablet (50 mcg total) by mouth daily before breakfast. 04/02/19   Medina-Vargas, Monina C, NP  Lidocaine (ASPERCREME LIDOCAINE) 4 % PTCH Place 1 patch onto the skin daily. APPLY TOPICALLY TO LOWER BACK ONCE DAILY FOR PAIN 04/02/19   Medina-Vargas, Monina C, NP  LORazepam (ATIVAN) 0.5 MG tablet Take 1 tablet (0.5 mg total) by mouth 2 (two) times daily as needed for up to 20 doses for anxiety. 04/02/19   Medina-Vargas, Monina C, NP  magnesium hydroxide (MILK OF MAGNESIA) 400 MG/5ML suspension If no BM in 3 days, give 30 cc Milk of Magnesium p.o. x 1 dose in 24 hours as needed (Do not use standing constipation orders for residents with renal failure CFR less than 30. Contact MD for orders) (Physician Order)    [provider]  Melatonin 3 MG TABS Take 1 tablet (3 mg total) by mouth at bedtime. FOR INSOMNIA 04/02/19   Medina-Vargas, Monina C, NP  meloxicam (MOBIC) 15 MG tablet  12/20/18   [provider]  metFORMIN (GLUCOPHAGE) 500 MG tablet Take 1 tablet (500 mg total) by mouth 2 (two) times daily with a meal. 04/02/19   Medina-Vargas, Monina C, NP  metoprolol tartrate (LOPRESSOR) 25 MG tablet Take 0.5 tablets (12.5 mg total) by mouth 2 (two) times daily. Take 0.5 tablet to = 12.5 mg if SBP >110 04/02/19   Medina-Vargas, Monina C, NP  Multiple Vitamin (MULTIVITAMIN WITH MINERALS) TABS tablet Take 1 tablet by mouth daily.     [provider]  Nutritional Supplement LIQD Take 120 mLs by mouth 2 (two) times a day. [employer]    [provider]  ondansetron (ZOFRAN) 4 MG tablet Take 1 tablet (4 mg total) by mouth every 6 (six) hours as needed for nausea or vomiting. GIVE 1 TABLET BY MOUTH EVERY 6 HOURS AS NEEDED FOR NAUSEA/VOMITING 04/02/19    Medina-Vargas, Monina C, NP  ONETOUCH VERIO test strip USE TO TEST BLOOD GLUCOSE ONCE DAILY 04/02/19   Medina-Vargas, Monina C, NP  pantoprazole (PROTONIX) 40 MG tablet Take 1 tablet (40 mg total) by mouth daily. 04/02/19   Medina-Vargas, Monina C, NP  polyvinyl alcohol (LIQUIFILM TEARS) 1.4 % ophthalmic solution Place 1 drop into both eyes every 4 (four) hours as needed for dry eyes. 04/02/19   Medina-Vargas, Monina C, NP  potassium chloride SA (K-DUR) 20 MEQ tablet Take 1 tablet (20 mEq total) by mouth once a week. On Wednesdays Patient taking differently: Take 20 mEq by mouth once a week. On Wednesdays 1/2 tablet 04/02/19   Medina-Vargas, Monina C, NP  torsemide (DEMADEX) 20 MG  tablet 1 tab orally daily Patient taking differently: Take 10 mg by mouth daily. 1 tab orally daily 04/02/19   Medina-Vargas, Monina C, NP  vitamin B-12 (CYANOCOBALAMIN) 1000 MCG tablet 1 tab by orally daily 04/02/19   Medina-Vargas, Monina C, NP    Family History Family History  Problem Relation Age of Onset  . Colon polyps Maternal Grandfather   . Bone cancer Maternal Grandfather   . Hypertension Mother   . Diabetes Mother   . Parkinson's disease Mother   . Hypertension Father   . Diabetes Father   . Heart Problems Father   . Asthma Brother   . Diabetes Brother   . Breast cancer Brother        stage 4, both breast  . Obesity Other   . Diabetes Sister        x 2  . COPD Sister   . Asthma Sister   . Clotting disorder Sister        blood count  . Rectal cancer Neg Hx   . Stomach cancer Neg Hx   . Esophageal cancer Neg Hx     Social History Social History   Tobacco Use  . Smoking status: Former Smoker    Types: Cigarettes    Quit date: 08/30/1971    Years since quitting: 47.7  . Smokeless tobacco: Never Used  Substance Use Topics  . Alcohol use: No    Alcohol/week: 0.0 standard drinks  . Drug use: No     Allergies   Aspirin, Bee pollen, Codeine, Fish oil, Sulfa antibiotics, Camphor, Camphor,  Lisinopril, Penicillins, Sulfasalazine, and Vicodin [hydrocodone-acetaminophen]   Review of Systems Review of Systems  Constitutional: Negative for fever.  HENT: Negative for sore throat.   Eyes: Negative for redness.  Respiratory: Negative for cough and shortness of breath.   Cardiovascular: Positive for chest pain.  Gastrointestinal: Positive for diarrhea and vomiting. Negative for abdominal pain.  Genitourinary: Negative for flank pain.  Musculoskeletal: Negative for back pain and neck pain.  Skin: Negative for rash.  Neurological: Negative for headaches.  Hematological: Does not bruise/bleed easily.  Psychiatric/Behavioral: Negative for confusion.     Physical Exam Updated Vital Signs BP (!) 115/49 (BP Location: Left Arm)   Pulse 88   Temp 97.7 F (36.5 C) (Oral)   Resp 20   Ht 1.6 m (5\' 3" )   Wt 81.6 kg   SpO2 94%   BMI 31.89 kg/m   Physical Exam Vitals signs and nursing note reviewed.  Constitutional:      Appearance: Normal appearance. She is well-developed.  HENT:     Head: Atraumatic.     Nose: Nose normal.     Mouth/Throat:     Mouth: Mucous membranes are moist.  Eyes:     General: No scleral icterus.    Conjunctiva/sclera: Conjunctivae normal.  Neck:     Musculoskeletal: Normal range of motion and neck supple. No neck rigidity or muscular tenderness.     Trachea: No tracheal deviation.  Cardiovascular:     Rate and Rhythm: Normal rate and regular rhythm.     Pulses: Normal pulses.     Heart sounds: Normal heart sounds. No murmur. No friction rub. No gallop.   Pulmonary:     Effort: Pulmonary effort is normal. No respiratory distress.     Breath sounds: Normal breath sounds.  Chest:     Chest wall: No tenderness.  Abdominal:     General: Bowel sounds are normal. There is no  distension.     Palpations: Abdomen is soft.     Tenderness: There is no abdominal tenderness. There is no guarding.  Genitourinary:    Comments: No cva tenderness.   Musculoskeletal:        General: No swelling or tenderness.     Right lower leg: No edema.     Left lower leg: No edema.  Skin:    General: Skin is warm and dry.     Findings: No rash.  Neurological:     Mental Status: She is alert.     Comments: Alert, speech normal.   Psychiatric:        Mood and Affect: Mood normal.      ED Treatments / Results  Labs (all labs ordered are listed, but only abnormal results are displayed) Results for orders placed or performed during the hospital encounter of 05/13/19  CBC  Result Value Ref Range   WBC 8.2 4.0 - 10.5 K/uL   RBC 4.05 3.87 - 5.11 MIL/uL   Hemoglobin 10.9 (L) 12.0 - 15.0 g/dL   HCT 36.5 36.0 - 46.0 %   MCV 90.1 80.0 - 100.0 fL   MCH 26.9 26.0 - 34.0 pg   MCHC 29.9 (L) 30.0 - 36.0 g/dL   RDW 15.5 11.5 - 15.5 %   Platelets 373 150 - 400 K/uL   nRBC 0.0 0.0 - 0.2 %  Comprehensive metabolic panel  Result Value Ref Range   Sodium 143 135 - 145 mmol/L   Potassium 3.6 3.5 - 5.1 mmol/L   Chloride 107 98 - 111 mmol/L   CO2 23 22 - 32 mmol/L   Glucose, Bld 158 (H) 70 - 99 mg/dL   BUN 23 8 - 23 mg/dL   Creatinine, Ser 0.82 0.44 - 1.00 mg/dL   Calcium 8.9 8.9 - 10.3 mg/dL   Total Protein 7.0 6.5 - 8.1 g/dL   Albumin 3.2 (L) 3.5 - 5.0 g/dL   AST 22 15 - 41 U/L   ALT 14 0 - 44 U/L   Alkaline Phosphatase 68 38 - 126 U/L   Total Bilirubin 0.4 0.3 - 1.2 mg/dL   GFR calc non Af Amer >60 >60 mL/min   GFR calc Af Amer >60 >60 mL/min   Anion gap 13 5 - 15  Troponin I (High Sensitivity)  Result Value Ref Range   Troponin I (High Sensitivity) 431 (HH) <18 ng/L   Dg Chest Portable 1 View  Result Date: 05/13/2019 CLINICAL DATA:  Chest pain. EXAM: PORTABLE CHEST 1 VIEW COMPARISON:  Radiograph Jan 09, 2019. FINDINGS: Stable cardiomediastinal silhouette. No pneumothorax is noted. Stable bibasilar subsegmental atelectasis or edema is noted. Small right pleural effusion is noted. Bony thorax is unremarkable. IMPRESSION: Stable bibasilar  subsegmental atelectasis or edema is noted. Small right pleural effusion. Electronically Signed   By: Marijo Conception M.D.   On: 05/13/2019 08:23    EKG EKG Interpretation  Date/Time:  Monday May 13 2019 07:45:03 EDT Ventricular Rate:  93 PR Interval:    QRS Duration: 144 QT Interval:  402 QTC Calculation: 500 R Axis:   -46 Text Interpretation:  Sinus rhythm Left bundle branch block Non-specific ST-t changes Confirmed by Lajean Saver [phone]) on 05/13/2019 7:49:18 AM   Radiology Dg Chest Portable 1 View  Result Date: 05/13/2019 CLINICAL DATA:  Chest pain. EXAM: PORTABLE CHEST 1 VIEW COMPARISON:  Radiograph Jan 09, 2019. FINDINGS: Stable cardiomediastinal silhouette. No pneumothorax is noted. Stable bibasilar subsegmental atelectasis or edema is  noted. Small right pleural effusion is noted. Bony thorax is unremarkable. IMPRESSION: Stable bibasilar subsegmental atelectasis or edema is noted. Small right pleural effusion. Electronically Signed   By: Marijo Conception M.D.   On: 05/13/2019 08:23    Procedures Procedures (including critical care time)  Medications Ordered in ED Medications  clopidogrel (PLAVIX) tablet 300 mg (has no administration in time range)  sodium chloride 0.9 % bolus 500 mL (has no administration in time range)  morphine 4 MG/ML injection 4 mg (has no administration in time range)  ondansetron (ZOFRAN) injection 4 mg (has no administration in time range)     Initial Impression / Assessment and Plan / ED Course  I have reviewed the triage vital signs and the nursing notes.  Pertinent labs & imaging results that were available during my care of the patient were reviewed by me and considered in my medical decision making (see chart for details).  Iv ns. Continuous pulse ox and monitor. Stat ecg and labs.   Reviewed nursing notes and prior charts for additional history.   Patient notes hx allergic rxn to asa. plavix po.   Morphine iv, zofran iv.   CXR  reviewed by me - no pna.   Labs reviewed by me - trop is high. Repeat ecg. Cardiology consulted. Heparin per pharmacy.   CRITICAL CARE RE: chest pain/ACS with elevated troponin, new LBBB Performed by: Mirna Mires Total critical care time: 40 minutes Critical care time was exclusive of separately billable procedures and treating other patients. Critical care was necessary to treat or prevent imminent or life-threatening deterioration. Critical care was time spent personally by me on the following activities: development of treatment plan with patient and/or surrogate as well as nursing, discussions with consultants, evaluation of patient's response to treatment, examination of patient, obtaining history from patient or surrogate, ordering and performing treatments and interventions, ordering and review of laboratory studies, ordering and review of radiographic studies, pulse oximetry and re-evaluation of patient's condition.   Final Clinical Impressions(s) / ED Diagnoses   Final diagnoses:  None    ED Discharge Orders    None       Lajean Saver, MD 05/13/19 [phone]

## 2019-05-13 NOTE — Progress Notes (Signed)
TCTS consulted for CABG evaluation. °

## 2019-05-13 NOTE — H&P (Addendum)
The patient has been seen in conjunction with Megan Bellis, NP. All aspects of care have been considered and discussed. The patient has been personally interviewed, examined, and all clinical data has been reviewed.   Discomfort in the chest beginning at 11 PM.  This had been preceded by nausea and vomiting on multiple occasions earlier in the day.  Still with mild residual discomfort but improved.  Under stress at home and in her personal life.  Her husband died within the past 27 to 44 months.  Prior history of diastolic heart failure.  Prior documentation of EF greater than 55% January 2020.  Dynamic LV outflow obstruction noted at that time.  An S4 gallop is heard on auscultation.  EKG reveals new left bundle branch block  Cardiac marker, high-sensitivity troponin I is mildly elevated.  Coronary angiography given risk factors for obstructive coronary disease.  Procedure and risk discussed.  The patient was counseled to undergo left heart catheterization, coronary angiography, and possible percutaneous coronary intervention with stent implantation. The procedural risks and benefits were discussed in detail. The risks discussed included death, stroke, myocardial infarction, life-threatening bleeding, limb ischemia, kidney injury, allergy, and possible emergency cardiac surgery. The risk of these significant complications were estimated to occur less than 1% of the time. After discussion, the patient has agreed to proceed.   Cardiology Admission History and Physical:   Patient ID: LIBRA BOOKBINDER MRN: [id number]; DOB: 03-08-1946   Admission date: 05/13/2019  Primary Care Provider: [name], MD Primary Cardiologist: Mertie Moores, MD  Primary Electrophysiologist:  None   Chief Complaint:  Chest pain  Patient Profile:   Megan [name] is a 73 y.o. female with PMH of chronic diastolic HF, OSA, HTN, DM, HL and CKD who presented with chest pain.   History of Present  Illness:   Ms. Dusel is a 73 yo female with PMH noted above. She was admitted back in Jan 2020 with acute on chronic diastolic HF with anasarca. Developed AKI during that admission. She was established with cardiology, seeing Dr. Acie Fredrickson. Had been on high dose furosemide but not receiving much benefit. She was switched to torsemide with improvement. Managed with unna boots.  She was seen through a video visit on 01/07/19. Noted that since Feb she had lost 44lbs. Did report a few episodes of lightheadedness and decision was made to reduce back her torsemide to 50mg  daily.  Placed in rehab for about month after experiencing weakness in her legs. Was able to regain her strength and transition from wheelchair, to walker to independent ambulation. Stayed with her sister for brief period of time after discharged. Phone notes indicate episodes of hypotension in the setting of diarrhea while in rehab.   She is now living back at home. Has home health aide that assist her as well. Did have a few episodes of diarrhea yesterday morning and afternoon. Some nausea. Went to bed around 9pm but awoke around 11pm with chest pressure/pain. Difficult to describe. Pain lingered but she was able to fall back asleep. Woke up again around 1am with ongoing symptoms. Felt nauseated and somewhat short of breath. Did have some radiation of pain into the right arm. Symptoms persisted for several hours and she eventually called her sister around 6am. Sister encouraged her to call 911. She was not given ASA as she has hx of anaphylaxis.    On arrival to the ED was given morphine with improvement in chest pain. Labs showed stable electrolytes, Cr 0.82, Hgb  10.9. HsT 431. COVID negative. EKG showed new LBBB when compared to tracing in Jan 2020. Still with lingering chest pain during assessment. Placed on IV heparin while in the ED.  Heart Pathway Score:     Past Medical History:  Diagnosis Date  . Allergy    bee stings  . Anxiety    . Asthmatic bronchitis   . Broken arm 1957/2008   right  . Cataract    had surgery   . Depression   . Diabetes mellitus without complication ([place])    prediabetes diet controlled  . Diverticulosis   . GERD (gastroesophageal reflux disease)   . Glaucoma    BIL  . Hemorrhoids   . Hx of adenomatous colonic polyps 12/02/2010  . Hyperlipidemia   . Hypertension   . Hypothyroidism 05/2018  . Inflammatory arthritis   . Knee bursitis   . Migraines   . Obesity   . Osteoarthritis   . Sleep apnea     Past Surgical History:  Procedure Laterality Date  . CARPAL TUNNEL RELEASE Left    left wrist  . CATARACT EXTRACTION W/ INTRAOCULAR LENS  IMPLANT, BILATERAL Bilateral    1999 left, 2008 right  . COLONOSCOPY    . EXCISION MORTON'S NEUROMA Left 1978   left foot  . GLAUCOMA SURGERY Bilateral    and laser surgery, 2004 left, 2008 right  . PARTIAL HYSTERECTOMY  1991   Left an ovary  . POLYPECTOMY    . TOENAIL AVULSION Right    big toe     Medications Prior to Admission: Prior to Admission medications   Medication Sig Start Date End Date Taking? Authorizing Provider  Acetaminophen (TYLENOL ARTHRITIS PAIN PO) Take 650 mg by mouth every 8 (eight) hours.     [provider]  allopurinol (ZYLOPRIM) 100 MG tablet Take 1 tablet (100 mg total) by mouth daily. 04/02/19   Medina-Vargas, Monina C, NP  bisacodyl (DULCOLAX) 10 MG suppository If not relieved by MOM, give 10 mg Bisacodyl suppositiory rectally X 1 dose in 24 hours as needed (Do not use constipation standing orders for residents with renal failure/CFR less than 30. Contact MD for orders) (Physician Order) 04/02/19   Medina-Vargas, Jaymes Graff C, NP  diclofenac sodium (VOLTAREN) 1 % GEL Apply 2 gm topically to left knee 04/02/19   Medina-Vargas, Monina C, NP  levothyroxine (SYNTHROID) 50 MCG tablet Take 1 tablet (50 mcg total) by mouth daily before breakfast. 04/02/19   Medina-Vargas, Monina C, NP  Lidocaine (ASPERCREME LIDOCAINE) 4 % PTCH  Place 1 patch onto the skin daily. APPLY TOPICALLY TO LOWER BACK ONCE DAILY FOR PAIN 04/02/19   Medina-Vargas, Monina C, NP  LORazepam (ATIVAN) 0.5 MG tablet Take 1 tablet (0.5 mg total) by mouth 2 (two) times daily as needed for up to 20 doses for anxiety. 04/02/19   Medina-Vargas, Monina C, NP  magnesium hydroxide (MILK OF MAGNESIA) 400 MG/5ML suspension If no BM in 3 days, give 30 cc Milk of Magnesium p.o. x 1 dose in 24 hours as needed (Do not use standing constipation orders for residents with renal failure CFR less than 30. Contact MD for orders) (Physician Order)    [provider]  Melatonin 3 MG TABS Take 1 tablet (3 mg total) by mouth at bedtime. FOR INSOMNIA 04/02/19   Medina-Vargas, Monina C, NP  meloxicam (MOBIC) 15 MG tablet  12/20/18   [provider]  metFORMIN (GLUCOPHAGE) 500 MG tablet Take 1 tablet (500 mg total) by mouth 2 (  two) times daily with a meal. 04/02/19   Medina-Vargas, Monina C, NP  metoprolol tartrate (LOPRESSOR) 25 MG tablet Take 0.5 tablets (12.5 mg total) by mouth 2 (two) times daily. Take 0.5 tablet to = 12.5 mg if SBP >110 04/02/19   Medina-Vargas, Monina C, NP  Multiple Vitamin (MULTIVITAMIN WITH MINERALS) TABS tablet Take 1 tablet by mouth daily.     [provider]  Nutritional Supplement LIQD Take 120 mLs by mouth 2 (two) times a day. Med[employer]    [provider]  ondansetron (ZOFRAN) 4 MG tablet Take 1 tablet (4 mg total) by mouth every 6 (six) hours as needed for nausea or vomiting. GIVE 1 TABLET BY MOUTH EVERY 6 HOURS AS NEEDED FOR NAUSEA/VOMITING 04/02/19   Medina-Vargas, Monina C, NP  ONETOUCH VERIO test strip USE TO TEST BLOOD GLUCOSE ONCE DAILY 04/02/19   Medina-Vargas, Monina C, NP  pantoprazole (PROTONIX) 40 MG tablet Take 1 tablet (40 mg total) by mouth daily. 04/02/19   Medina-Vargas, Monina C, NP  polyvinyl alcohol (LIQUIFILM TEARS) 1.4 % ophthalmic solution Place 1 drop into both eyes every 4 (four) hours as needed for dry eyes. 04/02/19    Medina-Vargas, Monina C, NP  potassium chloride SA (K-DUR) 20 MEQ tablet Take 1 tablet (20 mEq total) by mouth once a week. On Wednesdays Patient taking differently: Take 20 mEq by mouth once a week. On Wednesdays 1/2 tablet 04/02/19   Medina-Vargas, Monina C, NP  torsemide (DEMADEX) 20 MG tablet 1 tab orally daily Patient taking differently: Take 10 mg by mouth daily. 1 tab orally daily 04/02/19   Medina-Vargas, Monina C, NP  vitamin B-12 (CYANOCOBALAMIN) 1000 MCG tablet 1 tab by orally daily 04/02/19   Medina-Vargas, Monina C, NP     Allergies:    Allergies  Allergen Reactions  . Aspirin Anaphylaxis  . Bee Pollen     Extreme swelling due to bee stings  . Codeine Nausea And Vomiting  . Fish Oil Diarrhea  . Sulfa Antibiotics Nausea And Vomiting  . Iron Other (See Comments)    Severe Headache  . Camphor Dermatitis  . Camphor Dermatitis  . Lisinopril Cough  . Penicillins Rash    DID THE REACTION INVOLVE: Swelling of the face/tongue/throat, SOB, or low BP? Yes Sudden or severe rash/hives, skin peeling, or the inside of the mouth or nose? Unknown Did it require medical treatment? Unknown When did it last happen? If all above answers are "NO", may proceed with cephalosporin use.   . Sulfasalazine Nausea And Vomiting  . Vicodin [Hydrocodone-Acetaminophen] Nausea And Vomiting    Social History:   Social History   Socioeconomic History  . Marital status: Married    Spouse name: Lynnae Sandhoff  . Number of children: 0  . Years of education: Not on file  . Highest education level: Associate degree: occupational, Hotel manager, or vocational program  Occupational History  . Occupation: retired  Scientific laboratory technician  . Financial resource strain: Not on file  . Food insecurity    Worry: Not on file    Inability: Not on file  . Transportation needs    Medical: Not on file    Non-medical: Not on file  Tobacco Use  . Smoking status: Former Smoker    Types: Cigarettes    Quit date: 08/30/1971     Years since quitting: 47.7  . Smokeless tobacco: Never Used  Substance and Sexual Activity  . Alcohol use: No    Alcohol/week: 0.0 standard drinks  . Drug use:  No  . Sexual activity: Not on file  Lifestyle  . Physical activity    Days per week: Not on file    Minutes per session: Not on file  . Stress: Not on file  Relationships  . Social Herbalist on phone: Not on file    Gets together: Not on file    Attends religious service: Not on file    Active member of club or organization: Not on file    Attends meetings of clubs or organizations: Not on file    Relationship status: Not on file  . Intimate partner violence    Fear of current or ex partner: Not on file    Emotionally abused: Not on file    Physically abused: Not on file    Forced sexual activity: Not on file  Other Topics Concern  . Not on file  Social History Narrative   She is married, no children, retired.  Lives with husband in a one story home.  Retired from Geologist, engineering for [employer].    Family History:   The patient's family history includes Asthma in her brother and sister; Bone cancer in her maternal grandfather; Breast cancer in her brother; COPD in her sister; Clotting disorder in her sister; Colon polyps in her maternal grandfather; Diabetes in her brother, father, mother, and sister; Heart Problems in her father; Hypertension in her father and mother; Obesity in an other family member; Parkinson's disease in her mother. There is no history of Rectal cancer, Stomach cancer, or Esophageal cancer.    ROS:  Please see the history of present illness.  All other ROS reviewed and negative.     Physical Exam/Data:   Vitals:   05/13/19 0745 05/13/19 0748 05/13/19 0749 05/13/19 0830  BP: (!) 115/49 (!) 115/49    Pulse: 91 88  87  Resp: (!) 24 20  14   Temp:  97.7 F (36.5 C)    TempSrc:  Oral    SpO2: 94% 94%  92%  Weight:   81.6 kg   Height:   5\' 3"  (1.6 m)     Intake/Output Summary (Last 24  hours) at 05/13/2019 0951 Last data filed at 05/13/2019 0947 Gross per 24 hour  Intake 500 ml  Output -  Net 500 ml   Last 3 Weights 05/13/2019 04/12/2019 04/02/2019  Weight (lbs) 180 lb 180 lb 189 lb  Weight (kg) 81.647 kg 81.647 kg 85.73 kg     Body mass index is 31.89 kg/m.  General:  Well nourished, well developed, in no acute distress HEENT: normal Lymph: no adenopathy Neck: no JVD Vascular: No carotid bruits; FA pulses 2+ bilaterally without bruits  Cardiac:  normal S1, S2; RRR; no murmur  Lungs:  clear to auscultation bilaterally, no wheezing, rhonchi or rales  Abd: soft, nontender, no hepatomegaly  Ext: 1+ bilateral LE edema, some redness Musculoskeletal:  No deformities, BUE and BLE strength normal and equal Skin: warm and dry  Neuro:  CNs 2-12 intact, no focal abnormalities noted Psych:  Normal affect    EKG:  The ECG that was done 05/13/19 was personally reviewed and demonstrates New LBBB, SR  Relevant CV Studies:  TTE: 08/2018  Study Conclusions  - Left ventricle: The cavity size was normal. There was moderate   concentric hypertrophy. Systolic function was normal. The   estimated ejection fraction was in the range of 55% to 60%. There   was dynamic obstruction at rest, with a peak velocity  of 157   cm/sec and a peak gradient of 10 mm Hg. Wall motion was normal;   there were no regional wall motion abnormalities. Doppler   parameters are consistent with abnormal left ventricular   relaxation (grade 1 diastolic dysfunction). Doppler parameters   are consistent with indeterminate ventricular filling pressure. - Aortic valve: Transvalvular velocity was within the normal range.   There was no stenosis. There was no regurgitation. - Mitral valve: Transvalvular velocity was within the normal range.   There was no evidence for stenosis. There was no regurgitation. - Right ventricle: The cavity size was normal. Wall thickness was   normal. Systolic function was normal.  - Atrial septum: No defect or patent foramen ovale was identified   by color flow Doppler. - Tricuspid valve: There was trivial regurgitation. - Pulmonary arteries: Systolic pressure was mildly increased. PA   peak pressure: 40 mm Hg (S). - Pericardium, extracardiac: A small pericardial effusion was   identified. Features were not consistent with tamponade   physiology.  Laboratory Data:  High Sensitivity Troponin:   Recent Labs  Lab 05/13/19 0759  TROPONINIHS 431*      Chemistry Recent Labs  Lab 05/13/19 0759  NA 143  K 3.6  CL 107  CO2 23  GLUCOSE 158*  BUN 23  CREATININE 0.82  CALCIUM 8.9  GFRNONAA >60  GFRAA >60  ANIONGAP 13    Recent Labs  Lab 05/13/19 0759  PROT 7.0  ALBUMIN 3.2*  AST 22  ALT 14  ALKPHOS 68  BILITOT 0.4   Hematology Recent Labs  Lab 05/13/19 0759  WBC 8.2  RBC 4.05  HGB 10.9*  HCT 36.5  MCV 90.1  MCH 26.9  MCHC 29.9*  RDW 15.5  PLT 373   BNPNo results for input(s): BNP, PROBNP in the last 168 hours.  DDimer No results for input(s): DDIMER in the last 168 hours.   Radiology/Studies:  Dg Chest Portable 1 View  Result Date: 05/13/2019 CLINICAL DATA:  Chest pain. EXAM: PORTABLE CHEST 1 VIEW COMPARISON:  Radiograph Jan 09, 2019. FINDINGS: Stable cardiomediastinal silhouette. No pneumothorax is noted. Stable bibasilar subsegmental atelectasis or edema is noted. Small right pleural effusion is noted. Bony thorax is unremarkable. IMPRESSION: Stable bibasilar subsegmental atelectasis or edema is noted. Small right pleural effusion. Electronically Signed   By: Marijo Conception M.D.   On: 05/13/2019 08:23    Assessment and Plan:   Megan [name] is a 73 y.o. female with PMH of chronic diastolic HF, OSA, HTN, DM, HL and CKD who presented with chest pain.   1. NSTEMI: symptoms started about 11pm last night and have lingered through the morning. EKG with new LBBB and HsT 431. Concerning for ACS. Still with mild chest discomfort at  the time on assessment. Recommend coronary angiography to define anatomy. Could be stress cardiomyopathy? Significant stress with loss of husband and siblings over the past 1 1/2 years. -- The patient understands that risks included but are not limited to stroke (1 in 1000), death (1 in 41), kidney failure [usually temporary] (1 in 500), bleeding (1 in 200), allergic reaction [possibly serious] (1 in 200).  -- continue IV heparin -- did not receive ASA 2/2 anaphylaxis (loaded with 300mg  plavix while in the ED)  2. HTN: on metoprolol 12.5mg  BID prior to admission. Will continue the same. Blood pressures have been boaderline, therefore may not be able to further titrate.  3. HL:  had been on statin therapy but stopped  back in Jan 2020 per patient report of abnormal labs. At recent virtual visit with Dr. Posey Pronto notes indicated to remain off statin therapy.  -- check lipids   4. DM: on metformin PTA, hold. Add SSI  5. Chronic diastolic HF: echo 99991111 normal EF, grade 1DD. Mention of dynamic obstruction while at rest? No rWMA. Has been, per her report on torsemide 10mg  PTA. -- check echo  6. Leg weakness: present for several months. Referred to Dr. Posey Pronto as an outpatient with plans for a nerve conduction study.   7. LE redness: reports having cellulitis in the past. Treated with unna boots. Edema noted, not weeping. No leukocytosis.    Severity of Illness: The appropriate patient status for this patient is INPATIENT. Inpatient status is judged to be reasonable and necessary in order to provide the required intensity of service to ensure the patient's safety. The patient's presenting symptoms, physical exam findings, and initial radiographic and laboratory data in the context of their chronic comorbidities is felt to place them at high risk for further clinical deterioration. Furthermore, it is not anticipated that the patient will be medically stable for discharge from the hospital within 2  midnights of admission. The following factors support the patient status of inpatient.   " The patient's presenting symptoms include chest pain, shortness of breath. " The worrisome physical exam findings include stable exam. " The initial radiographic and laboratory data are worrisome because of elevated HsT, new LBBB. " The chronic co-morbidities include DM, HTN, HL.   * I certify that at the point of admission it is my clinical judgment that the patient will require inpatient hospital care spanning beyond 2 midnights from the point of admission due to high intensity of service, high risk for further deterioration and high frequency of surveillance required.*    For questions or updates, please contact [place] Please consult [email or web address] for contact info under        Signed, Megan Bellis, NP  05/13/2019 9:51 AM

## 2019-05-13 NOTE — Progress Notes (Signed)
  Echocardiogram 2D Echocardiogram has been performed.  Megan [name] 05/13/2019, 5:20 PM

## 2019-05-13 NOTE — Consult Note (Addendum)
[address]       ,[place] [address]             [phone]        Secilia R Szalkowski Steele Medical Record #[id number] Date of Birth: 03-13-1946  Referring: No ref. provider found Primary Care: [name], MD Primary Cardiologist:Philip Nahser, MD  Chief Complaint:    Chief Complaint  Patient presents with  . Chest Pain    History of Present Illness:       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  She had had nausea and vomiting on multiple occasions earlier that day.  Her husband recently passed away within the last 41 to 18 months and she is been under a lot of stress at home and in her personal life.  She was seen earlier this year on Jan 07, 2019 through video chat by her cardiologist.  She had been placed in rehab for about a month after experiencing weakness in her legs.  She was eventually discharged and now lives at home with a home health aide to assist her.  She had a few bouts of diarrhea and some nausea yesterday morning and afternoon.  Then, around 11 PM she awoke with chest pressure/pain.  The pain continued but she was able to fall back to sleep.  She woke up again around 1 AM with continued symptoms.  She did have some radiating pain down her right arm.  After the symptoms persisted for several hours she called her sister the following morning and subsequently called 911.    Once in the ED, she was given morphine with improvement in her chest pain.  Her EKG showed a new left bundle branch block when compared to the previous EKG from January 2020.  She was placed on IV heparin  while in the ED. She had a TTE done in January 2020 which showed an estimated ejection fraction of 55 to 60%.  She did not have any notable valvular abnormalities at that time.  She underwent a cardiac catheterization this afternoon which showed critical ostial LAD stenosis, 65% RCA distal stenosis, and a an estimated left ventricular ejection fraction of 35- 45%.  She was started on Aggrastat IV per pharmacy since she has an aspirin allergy.  Cardiothoracic surgery was consulted for possible surgical revascularization.   Of note, she had bilateral cellulitis earlier this year which was treated with antibiotics.  She then fell a few months back and she broke her ankle and tore ligament in her knee.  She went to a rehab program and was recently released August.  She now has help at home getting in-home therapy.  She also needs help getting in the shower.  She gets around using a walker.    Current Activity/ Functional Status: Patient was not independent with mobility/ambulation, transfers, ADL's, IADL's.   Zubrod Score: At the time of surgery this patient's most appropriate activity status/level should be described as: []    0    Normal activity, no symptoms [x]    1    Restricted in physical strenuous activity but ambulatory, able to do out light work [x]    2  Ambulatory and capable of self care, unable to do work activities, up and about                 more than 50%  Of the time                            []    3    Only limited self care, in bed greater than 50% of waking hours []    4    Completely disabled, no self care, confined to bed or chair []    5    Moribund  Past Medical History:  Diagnosis Date  . Allergy    bee stings  . Anxiety   . Asthmatic bronchitis   . Broken arm 1957/2008   right  . Cataract    had surgery   . Depression   . Diabetes mellitus without complication ([place])    prediabetes diet controlled  . Diverticulosis   . GERD (gastroesophageal reflux disease)    . Glaucoma    BIL  . Hemorrhoids   . Hx of adenomatous colonic polyps 12/02/2010  . Hyperlipidemia   . Hypertension   . Hypothyroidism 05/2018  . Inflammatory arthritis   . Knee bursitis   . Migraines   . Obesity   . Osteoarthritis   . Sleep apnea     Past Surgical History:  Procedure Laterality Date  . CARPAL TUNNEL RELEASE Left    left wrist  . CATARACT EXTRACTION W/ INTRAOCULAR LENS  IMPLANT, BILATERAL Bilateral    1999 left, 2008 right  . COLONOSCOPY    . EXCISION MORTON'S NEUROMA Left 1978   left foot  . GLAUCOMA SURGERY Bilateral    and laser surgery, 2004 left, 2008 right  . PARTIAL HYSTERECTOMY  1991   Left an ovary  . POLYPECTOMY    . TOENAIL AVULSION Right    big toe    Social History   Tobacco Use  Smoking Status Former Smoker  . Types: Cigarettes  . Quit date: 08/30/1971  . Years since quitting: 47.7  Smokeless Tobacco Never Used    Social History   Substance and Sexual Activity  Alcohol Use No  . Alcohol/week: 0.0 standard drinks     Allergies  Allergen Reactions  . Aspirin Anaphylaxis  . Bee Pollen     Extreme swelling due to bee stings  . Codeine Nausea And Vomiting  . Fish Oil Diarrhea  . Sulfa Antibiotics Nausea And Vomiting  . Iron Other (See Comments)    Severe Headache  . Camphor Dermatitis  . Camphor Dermatitis  . Lisinopril Cough  . Penicillins Rash    DID THE REACTION INVOLVE: Swelling of the face/tongue/throat, SOB, or low BP? Yes Sudden or severe rash/hives, skin peeling, or the inside of the mouth or nose? Unknown Did it require medical treatment? Unknown When did it last happen? If all above answers are "NO", may proceed with cephalosporin use.   . Sulfasalazine Nausea And Vomiting  . Vicodin [Hydrocodone-Acetaminophen] Nausea And Vomiting    Current Facility-Administered Medications  Medication Dose Route Frequency Provider Last Rate Last Dose  . [START ON 05/14/2019] 0.9 %  sodium chloride infusion  250 mL  Intravenous PRN Martinique, Peter M, MD      . 0.9% sodium chloride infusion  1 mL/kg/hr Intravenous Continuous Martinique, Peter M, MD 81.6 mL/hr at 05/13/19 1615 1 mL/kg/hr at 05/13/19 1615  . acetaminophen (TYLENOL) tablet 650  mg  650 mg Oral Q4H PRN Martinique, Peter M, MD      . atorvastatin (LIPITOR) tablet 80 mg  80 mg Oral q1800 Martinique, Peter M, MD      . Derrill Memo ON 05/14/2019] Chlorhexidine Gluconate Cloth 2 % PADS 6 each  6 each Topical Q0600 Martinique, Peter M, MD      . heparin ADULT infusion 100 units/mL (25000 units/261m sodium chloride 0.45%)  900 Units/hr Intravenous Continuous WLyndee Leo RPH      . insulin aspart (novoLOG) injection 0-15 Units  0-15 Units Subcutaneous TID WC JMartinique Peter M, MD      . [Derrill MemoON 05/14/2019] levothyroxine (SYNTHROID) tablet 50 mcg  50 mcg Oral Q0600 JMartinique Peter M, MD      . [Derrill MemoON 05/14/2019] metoprolol tartrate (LOPRESSOR) tablet 12.5 mg  12.5 mg Oral BID JMartinique Peter M, MD      . mupirocin ointment (BACTROBAN) 2 % 1 application  1 application Nasal BID JMartinique Peter M, MD      . nitroGLYCERIN (NITROSTAT) SL tablet 0.4 mg  0.4 mg Sublingual Q5 Min x 3 PRN JMartinique Peter M, MD      . nitroGLYCERIN 50 mg in dextrose 5 % 250 mL (0.2 mg/mL) infusion  0-200 mcg/min Intravenous Titrated JMartinique Peter M, MD      . ondansetron ([facility] injection 4 mg  4 mg Intravenous Q6H PRN JMartinique Peter M, MD      . pantoprazole (PROTONIX) EC tablet 40 mg  40 mg Oral Daily JMartinique Peter M, MD   Stopped at 05/13/19 1247  . sodium chloride flush (NS) 0.9 % injection 3 mL  3 mL Intravenous Q12H JMartinique Peter M, MD      . [Derrill MemoON 05/14/2019] sodium chloride flush (NS) 0.9 % injection 3 mL  3 mL Intravenous Q12H JMartinique Peter M, MD      . [Derrill MemoON 05/14/2019] sodium chloride flush (NS) 0.9 % injection 3 mL  3 mL Intravenous PRN JMartinique Peter M, MD      . tirofiban (AGGRASTAT) infusion 50 mcg/mL 100 mL  0.15 mcg/kg/min Intravenous Continuous WLyndee Leo RPH 14.69 mL/hr at  05/13/19 1614 0.15 mcg/kg/min at 05/13/19 1614  . [START ON 05/14/2019] torsemide (DEMADEX) tablet 10 mg  10 mg Oral Daily JMartinique Peter M, MD        Medications Prior to Admission  Medication Sig Dispense Refill Last Dose  . Acetaminophen (TYLENOL ARTHRITIS PAIN PO) Take 650 mg by mouth every 8 (eight) hours.    05/12/2019 at prn  . allopurinol (ZYLOPRIM) 100 MG tablet Take 1 tablet (100 mg total) by mouth daily. 30 tablet 0 05/12/2019 at Unknown time  . bisacodyl (DULCOLAX) 10 MG suppository If not relieved by MOM, give 10 mg Bisacodyl suppositiory rectally X 1 dose in 24 hours as needed (Do not use constipation standing orders for residents with renal failure/CFR less than 30. Contact MD for orders) (Physician Order) 12 suppository 0 unknown at prn  . diclofenac sodium (VOLTAREN) 1 % GEL Apply 2 gm topically to left knee (Patient taking differently: Apply 2 g topically as needed (Knee pain). Apply 2 gm topically to left knee) 100 g 0 05/10/2019  . Ensure (ENSURE) Take 237 mLs by mouth as needed.   05/12/2019 at Unknown time  . levothyroxine (SYNTHROID) 50 MCG tablet Take 1 tablet (50 mcg total) by mouth daily before breakfast. 30 tablet 0 05/13/2019 at 0600  . Lidocaine (ASPERCREME LIDOCAINE) 4 % PTCH  Place 1 patch onto the skin daily. APPLY TOPICALLY TO LOWER BACK ONCE DAILY FOR PAIN (Patient taking differently: Place 1 patch onto the skin 2 (two) times daily as needed (Back pain). APPLY TOPICALLY TO LOWER BACK ONCE DAILY FOR PAIN ) 30 patch 0 05/11/2019  . LORazepam (ATIVAN) 0.5 MG tablet Take 1 tablet (0.5 mg total) by mouth 2 (two) times daily as needed for up to 20 doses for anxiety. 20 tablet 0 05/12/2019 at prn  . Melatonin 3 MG TABS Take 1 tablet (3 mg total) by mouth at bedtime. FOR INSOMNIA 30 tablet 0 05/12/2019 at Unknown time  . metFORMIN (GLUCOPHAGE) 500 MG tablet Take 1 tablet (500 mg total) by mouth 2 (two) times daily with a meal. 60 tablet 0 05/12/2019 at Unknown time  . metoprolol  tartrate (LOPRESSOR) 25 MG tablet Take 0.5 tablets (12.5 mg total) by mouth 2 (two) times daily. Take 0.5 tablet to = 12.5 mg if SBP >110 30 tablet 0 05/12/2019 at 1800  . Multiple Vitamin (MULTIVITAMIN WITH MINERALS) TABS tablet Take 1 tablet by mouth at bedtime.    05/12/2019 at Unknown time  . ondansetron (ZOFRAN) 4 MG tablet Take 1 tablet (4 mg total) by mouth every 6 (six) hours as needed for nausea or vomiting. GIVE 1 TABLET BY MOUTH EVERY 6 HOURS AS NEEDED FOR NAUSEA/VOMITING (Patient taking differently: Take 4 mg by mouth every 6 (six) hours as needed for nausea or vomiting. ) 20 tablet 0 unknown at prn  . pantoprazole (PROTONIX) 40 MG tablet Take 1 tablet (40 mg total) by mouth daily. 30 tablet 0 05/13/2019 at Unknown time  . polyvinyl alcohol (LIQUIFILM TEARS) 1.4 % ophthalmic solution Place 1 drop into both eyes every 4 (four) hours as needed for dry eyes. 15 mL 0 05/12/2019 at prn  . potassium chloride SA (K-DUR) 20 MEQ tablet Take 1 tablet (20 mEq total) by mouth once a week. On Wednesdays (Patient taking differently: Take 20 mEq by mouth once a week. Wednesday) 4 tablet 0 05/08/2019  . torsemide (DEMADEX) 20 MG tablet 1 tab orally daily (Patient taking differently: Take 10 mg by mouth daily. ) 30 tablet 0 05/12/2019 at Unknown time  . vitamin B-12 (CYANOCOBALAMIN) 1000 MCG tablet 1 tab by orally daily (Patient taking differently: Take 1,000 mcg by mouth daily. ) 30 tablet 0 05/12/2019 at Unknown time  . ONETOUCH VERIO test strip USE TO TEST BLOOD GLUCOSE ONCE DAILY 100 each 0     Family History  Problem Relation Age of Onset  . Colon polyps Maternal Grandfather   . Bone cancer Maternal Grandfather   . Hypertension Mother   . Diabetes Mother   . Parkinson's disease Mother   . Hypertension Father   . Diabetes Father   . Heart Problems Father   . Asthma Brother   . Diabetes Brother   . Breast cancer Brother        stage 4, both breast  . Obesity Other   . Diabetes Sister        x 2  .  COPD Sister   . Asthma Sister   . Clotting disorder Sister        blood count  . Rectal cancer Neg Hx   . Stomach cancer Neg Hx   . Esophageal cancer Neg Hx      Review of Systems:   Review of Systems  Constitutional: Positive for malaise/fatigue. Negative for chills and fever.  Respiratory: Positive for shortness of breath.  Cardiovascular: Positive for chest pain, claudication and leg swelling. Negative for palpitations.  Gastrointestinal: Positive for nausea.  Genitourinary: Negative for dysuria.  Psychiatric/Behavioral: The patient is nervous/anxious.    Pertinent items are noted in HPI.      Physical Exam: BP (!) 108/54 (BP Location: Right Arm)   Pulse 89   Temp 98.1 F (36.7 C) (Oral)   Resp 16   Ht 5' 3" (1.6 m)   Wt 81.6 kg   SpO2 94%   BMI 31.89 kg/m    General appearance: alert, cooperative and no distress Resp: clear to auscultation bilaterally Cardio: regular rate and rhythm, S1, S2 normal, no murmur, click, rub or gallop GI: soft, non-tender; bowel sounds normal; no masses,  no organomegaly Extremities: some edema especially in the right lower extremity. She has weakness in her left leg Neurologic: Grossly normal  Diagnostic Studies & Laboratory data:     Recent Radiology Findings:   Dg Chest Portable 1 View  Result Date: 05/13/2019 CLINICAL DATA:  Chest pain. EXAM: PORTABLE CHEST 1 VIEW COMPARISON:  Radiograph Jan 09, 2019. FINDINGS: Stable cardiomediastinal silhouette. No pneumothorax is noted. Stable bibasilar subsegmental atelectasis or edema is noted. Small right pleural effusion is noted. Bony thorax is unremarkable. IMPRESSION: Stable bibasilar subsegmental atelectasis or edema is noted. Small right pleural effusion. Electronically Signed   By: Marijo Conception M.D.   On: 05/13/2019 08:23     I have independently reviewed the above radiologic studies and discussed with the patient   Recent Lab Findings: Lab Results  Component Value Date    WBC 8.2 05/13/2019   HGB 10.9 (L) 05/13/2019   HCT 36.5 05/13/2019   PLT 373 05/13/2019   GLUCOSE 158 (H) 05/13/2019   ALT 14 05/13/2019   AST 22 05/13/2019   NA 143 05/13/2019   K 3.6 05/13/2019   CL 107 05/13/2019   CREATININE 0.82 05/13/2019   BUN 23 05/13/2019   CO2 23 05/13/2019   TSH 3.265 09/09/2018   HGBA1C 7.1 (H) 09/09/2018      Assessment / Plan:   1. Coronary artery disease-Two-vessel CAD. Continue medical therapy. CABG procedure discussed with the patient 2. CHF-Echocardiogram performed at the bedside today. Previous EF 35-45% 3. Diabetes mellitus Type 2-with neuropathy? Needs a hemoglobin A1C, holding metformin 4. Lower extremity pain and weakness-being worked up by neuro outpatient. Left leg weaker than the right leg.  5. Remote smoker (quit in the 1970s) 6. CKD-last creatinine 0.82, electrolytes okay.  7. OSA 8. Hypertension-recent episode of hypotension. Not on medication.  9. Hypothyroidism-On home dose of Synthroid 10. Anxiety-not medicated   Plan:   Patient has been seen yesterday and examined cardiac cath films have been reviewed, echocardiogram reviewed.  Her previous history is also been discussed with her.  The patient is admitted with myocardial infarction, positive troponins, very high-grade proximal LAD lesion not suitable for angioplasty with concomitant right coronary artery disease of high-grade.  Ejection fraction is reduced 35% by echo.  With the patient's recent myocardial infarction and significant coronary disease and not suitable for stenting I have discussed with her proceeding with coronary artery bypass graft.  This was done in the presence of her sister.  When I initially met the patient her chart was labeled as DNR.  In discussion with the patient and her sister this was not her intention, she wanted to make sure it was known that her sister was her healthcare power of attorney and that she had advanced directives in  place.  The DNR order has  been reversed by cardiology.  I discussed further with the patient and her sister today about proceeding with coronary artery bypass grafting tomorrow.  The patient understands the risk and is willing to proceed.    The goals risks and alternatives of the planned surgical procedure Procedure(s): LEFT HEART CATH AND CORONARY ANGIOGRAPHY (N/A)  have been discussed with the patient in detail. The risks of the procedure including death, infection, stroke, myocardial infarction, bleeding, blood transfusion have all been discussed specifically.  I have quoted [name] a 4 % of perioperative mortality and a complication rate as high as 40%. The patient's questions have been answered.CORINTHIA HELMERS is willing  to proceed with the planned procedure.

## 2019-05-13 NOTE — Progress Notes (Signed)
ANTICOAGULATION CONSULT NOTE - Initial Consult  Pharmacy Consult for heparin Indication: chest pain/ACS  Allergies  Allergen Reactions  . Aspirin Anaphylaxis  . Bee Pollen     Extreme swelling due to bee stings  . Codeine Nausea And Vomiting  . Fish Oil Diarrhea  . Sulfa Antibiotics Nausea And Vomiting  . Camphor Dermatitis  . Camphor Dermatitis  . Lisinopril Cough  . Penicillins Rash    DID THE REACTION INVOLVE: Swelling of the face/tongue/throat, SOB, or low BP? Yes Sudden or severe rash/hives, skin peeling, or the inside of the mouth or nose? Unknown Did it require medical treatment? Unknown When did it last happen? If all above answers are "NO", may proceed with cephalosporin use.   . Sulfasalazine Nausea And Vomiting  . Vicodin [Hydrocodone-Acetaminophen] Nausea And Vomiting    Patient Measurements: Height: 5\' 3"  (160 cm) Weight: 180 lb (81.6 kg) IBW/kg (Calculated) : 52.4 Heparin Dosing Weight: 70.3kg  Vital Signs: Temp: 97.7 F (36.5 C) (09/14 0748) Temp Source: Oral (09/14 0748) BP: 115/49 (09/14 0748) Pulse Rate: 87 (09/14 0830)  Labs: Recent Labs    05/13/19 0759  HGB 10.9*  HCT 36.5  PLT 373  CREATININE 0.82  TROPONINIHS 431*    Estimated Creatinine Clearance: 61.8 mL/min (by C-G formula based on SCr of 0.82 mg/dL).   Medical History: Past Medical History:  Diagnosis Date  . Allergy    bee stings  . Anxiety   . Asthmatic bronchitis   . Broken arm 1957/2008   right  . Cataract    had surgery   . Depression   . Diabetes mellitus without complication ([place])    prediabetes diet controlled  . Diverticulosis   . GERD (gastroesophageal reflux disease)   . Glaucoma    BIL  . Hemorrhoids   . Hx of adenomatous colonic polyps 12/02/2010  . Hyperlipidemia   . Hypertension   . Hypothyroidism 05/2018  . Inflammatory arthritis   . Knee bursitis   . Migraines   . Obesity   . Osteoarthritis   . Sleep apnea     Medications:  Infusions:   . heparin      Assessment: 57 yof presented to the ED with CP. Troponin elevated and now starting IV heparin. Baseline Hgb is slightly low but platelets are WNL. She is not on anticoagulation PTA.   Goal of Therapy:  Heparin level 0.3-0.7 units/ml Monitor platelets by anticoagulation protocol: Yes   Plan:  Heparin bolus 4000 units IV x 1 Heparin gtt 900 units/hr Check an 8 hr heparin level Daily heparin level and CBC  Candela Krul, Rande Lawman 05/13/2019,9:32 AM

## 2019-05-13 NOTE — Progress Notes (Signed)
[facility] for heparin Indication: chest pain/ACS  Allergies  Allergen Reactions  . Aspirin Anaphylaxis  . Bee Pollen     Extreme swelling due to bee stings  . Codeine Nausea And Vomiting  . Fish Oil Diarrhea  . Sulfa Antibiotics Nausea And Vomiting  . Iron Other (See Comments)    Severe Headache  . Camphor Dermatitis  . Camphor Dermatitis  . Lisinopril Cough  . Penicillins Rash    DID THE REACTION INVOLVE: Swelling of the face/tongue/throat, SOB, or low BP? Yes Sudden or severe rash/hives, skin peeling, or the inside of the mouth or nose? Unknown Did it require medical treatment? Unknown When did it last happen? If all above answers are "NO", may proceed with cephalosporin use.   . Sulfasalazine Nausea And Vomiting  . Vicodin [Hydrocodone-Acetaminophen] Nausea And Vomiting    Patient Measurements: Height: 5\' 3"  (160 cm) Weight: 180 lb (81.6 kg) IBW/kg (Calculated) : 52.4 Heparin Dosing Weight: 70.3kg  Vital Signs: Temp: 97.7 F (36.5 C) (09/14 0748) Temp Source: Oral (09/14 0748) BP: 92/52 (09/14 1429) Pulse Rate: 90 (09/14 1429)  Labs: Recent Labs    05/13/19 0759 05/13/19 1031  HGB 10.9*  --   HCT 36.5  --   PLT 373  --   CREATININE 0.82  --   TROPONINIHS 431* 3,549*    Estimated Creatinine Clearance: 61.8 mL/min (by C-G formula based on SCr of 0.82 mg/dL).   Medical History: Past Medical History:  Diagnosis Date  . Allergy    bee stings  . Anxiety   . Asthmatic bronchitis   . Broken arm 1957/2008   right  . Cataract    had surgery   . Depression   . Diabetes mellitus without complication ([place])    prediabetes diet controlled  . Diverticulosis   . GERD (gastroesophageal reflux disease)   . Glaucoma    BIL  . Hemorrhoids   . Hx of adenomatous colonic polyps 12/02/2010  . Hyperlipidemia   . Hypertension   . Hypothyroidism 05/2018  . Inflammatory arthritis   . Knee bursitis   . Migraines   .  Obesity   . Osteoarthritis   . Sleep apnea     Medications:  Infusions:  . sodium chloride    . sodium chloride 0.123 mL/kg/hr (05/13/19 1359)  . heparin Stopped (05/13/19 1359)  . tirofiban      Assessment: 30 yof presented to the ED with CP.   Patient with severe LAD disease on cath this afternoon, location not amenable to PCI, TCTS consulted for surgery recommendations. Unfortunately patient has an aspirin allergy and was given 300mg  of plavix this morning. Orders to start tirofiban for for antiplatelet with asa allergy, and restart heparin this evening 8 hours after sheath pull (~1430).    Goal of Therapy:  Heparin level 0.3-0.7 units/ml Monitor platelets by anticoagulation protocol: Yes   Plan:  Restart heparin at 900 units/hr ~22:30 tonight Tirofiban 0.80mcg/kg/min until surgery, plan to stop 6 hours prior to surgery  Erin Hearing PharmD., BCPS Clinical Pharmacist 05/13/2019 3:02 PM

## 2019-05-14 ENCOUNTER — Encounter (HOSPITAL_COMMUNITY): Payer: Self-pay | Admitting: Cardiology

## 2019-05-14 ENCOUNTER — Other Ambulatory Visit: Payer: Self-pay | Admitting: Adult Health

## 2019-05-14 ENCOUNTER — Inpatient Hospital Stay (HOSPITAL_COMMUNITY): Payer: Medicare Other

## 2019-05-14 DIAGNOSIS — M1712 Unilateral primary osteoarthritis, left knee: Secondary | ICD-10-CM

## 2019-05-14 DIAGNOSIS — Z01810 Encounter for preprocedural cardiovascular examination: Secondary | ICD-10-CM

## 2019-05-14 DIAGNOSIS — I5023 Acute on chronic systolic (congestive) heart failure: Secondary | ICD-10-CM

## 2019-05-14 LAB — CBC
HCT: 30.7 % — ABNORMAL LOW (ref 36.0–46.0)
Hemoglobin: 9.6 g/dL — ABNORMAL LOW (ref 12.0–15.0)
MCH: 27.9 pg (ref 26.0–34.0)
MCHC: 31.3 g/dL (ref 30.0–36.0)
MCV: 89.2 fL (ref 80.0–100.0)
Platelets: 332 10*3/uL (ref 150–400)
RBC: 3.44 MIL/uL — ABNORMAL LOW (ref 3.87–5.11)
RDW: 15.4 % (ref 11.5–15.5)
WBC: 7.3 10*3/uL (ref 4.0–10.5)
nRBC: 0 % (ref 0.0–0.2)

## 2019-05-14 LAB — GLUCOSE, CAPILLARY
Glucose-Capillary: 110 mg/dL — ABNORMAL HIGH (ref 70–99)
Glucose-Capillary: 120 mg/dL — ABNORMAL HIGH (ref 70–99)
Glucose-Capillary: 110 mg/dL — ABNORMAL HIGH (ref 70–99)
Glucose-Capillary: 124 mg/dL — ABNORMAL HIGH (ref 70–99)

## 2019-05-14 LAB — URINALYSIS, ROUTINE W REFLEX MICROSCOPIC
Bilirubin Urine: NEGATIVE
Glucose, UA: NEGATIVE mg/dL
Ketones, ur: NEGATIVE mg/dL
Leukocytes,Ua: NEGATIVE
Nitrite: NEGATIVE
Protein, ur: NEGATIVE mg/dL
Specific Gravity, Urine: 1.02 (ref 1.005–1.030)
pH: 5 (ref 5.0–8.0)

## 2019-05-14 LAB — ABO/RH: ABO/RH(D): O POS

## 2019-05-14 LAB — LACTIC ACID, PLASMA: Lactic Acid, Venous: 0.6 mmol/L (ref 0.5–1.9)

## 2019-05-14 LAB — HEPARIN LEVEL (UNFRACTIONATED)
Heparin Unfractionated: <0.1 IU/mL — ABNORMAL LOW (ref 0.30–0.70)
Heparin Unfractionated: 0.18 IU/mL — ABNORMAL LOW (ref 0.30–0.70)

## 2019-05-14 LAB — HEMOGLOBIN A1C
Hgb A1c MFr Bld: 6.3 % — ABNORMAL HIGH (ref 4.8–5.6)
Mean Plasma Glucose: 134 mg/dL

## 2019-05-14 MED ORDER — DEXMEDETOMIDINE HCL IN NACL 400 MCG/100ML IV SOLN
0.1000 ug/kg/h | INTRAVENOUS | Status: AC
  Administered 2019-05-15: 12:00:00 0.7 ug/kg/h via INTRAVENOUS
  Filled 2019-05-14: qty 100

## 2019-05-14 MED ORDER — MAGNESIUM SULFATE 50 % IJ SOLN
40.0000 meq | INTRAMUSCULAR | Status: DC
  Filled 2019-05-14: qty 9.85

## 2019-05-14 MED ORDER — NITROGLYCERIN IN D5W 200-5 MCG/ML-% IV SOLN
2.0000 ug/min | INTRAVENOUS | Status: AC
  Administered 2019-05-15: 5 ug/min via INTRAVENOUS
  Filled 2019-05-14: qty 250

## 2019-05-14 MED ORDER — SODIUM CHLORIDE 0.9 % IV SOLN
INTRAVENOUS | Status: DC
  Filled 2019-05-14: qty 30

## 2019-05-14 MED ORDER — PHENYLEPHRINE HCL-NACL 20-0.9 MG/250ML-% IV SOLN
30.0000 ug/min | INTRAVENOUS | Status: AC
  Administered 2019-05-15: 25 ug/min via INTRAVENOUS
  Filled 2019-05-14: qty 250

## 2019-05-14 MED ORDER — METOPROLOL TARTRATE 12.5 MG HALF TABLET
12.5000 mg | ORAL_TABLET | Freq: Once | ORAL | Status: DC
  Filled 2019-05-14: qty 1

## 2019-05-14 MED ORDER — CHLORHEXIDINE GLUCONATE CLOTH 2 % EX PADS
6.0000 | MEDICATED_PAD | Freq: Once | CUTANEOUS | Status: AC
  Administered 2019-05-14: 23:00:00 6 via TOPICAL

## 2019-05-14 MED ORDER — TRANEXAMIC ACID 1000 MG/10ML IV SOLN
1.5000 mg/kg/h | INTRAVENOUS | Status: AC
  Administered 2019-05-15: 1.5 mg/kg/h via INTRAVENOUS
  Filled 2019-05-14: qty 25

## 2019-05-14 MED ORDER — DOPAMINE-DEXTROSE 3.2-5 MG/ML-% IV SOLN
0.0000 ug/kg/min | INTRAVENOUS | Status: AC
  Administered 2019-05-15: 3 ug/kg/min via INTRAVENOUS
  Filled 2019-05-14: qty 250

## 2019-05-14 MED ORDER — PLASMA-LYTE 148 IV SOLN
INTRAVENOUS | Status: DC
  Filled 2019-05-14: qty 2.5

## 2019-05-14 MED ORDER — INSULIN REGULAR(HUMAN) IN NACL 100-0.9 UT/100ML-% IV SOLN
INTRAVENOUS | Status: AC
  Administered 2019-05-15: 1.2 [IU]/h via INTRAVENOUS
  Filled 2019-05-14: qty 100

## 2019-05-14 MED ORDER — CHLORHEXIDINE GLUCONATE CLOTH 2 % EX PADS
6.0000 | MEDICATED_PAD | Freq: Once | CUTANEOUS | Status: AC
  Administered 2019-05-15: 6 via TOPICAL

## 2019-05-14 MED ORDER — TRANEXAMIC ACID (OHS) BOLUS VIA INFUSION
15.0000 mg/kg | INTRAVENOUS | Status: AC
  Administered 2019-05-15: 09:00:00 1237.5 mg via INTRAVENOUS
  Filled 2019-05-14: qty 1238

## 2019-05-14 MED ORDER — BISACODYL 5 MG PO TBEC
5.0000 mg | DELAYED_RELEASE_TABLET | Freq: Once | ORAL | Status: AC
  Administered 2019-05-14: 5 mg via ORAL
  Filled 2019-05-14: qty 1

## 2019-05-14 MED ORDER — MILRINONE LACTATE IN DEXTROSE 20-5 MG/100ML-% IV SOLN
0.3000 ug/kg/min | INTRAVENOUS | Status: AC
  Administered 2019-05-15: 12:00:00 0.3 ug/kg/min via INTRAVENOUS
  Filled 2019-05-14: qty 100

## 2019-05-14 MED ORDER — LEVOFLOXACIN IN D5W 500 MG/100ML IV SOLN
500.0000 mg | INTRAVENOUS | Status: AC
  Administered 2019-05-15: 500 mg via INTRAVENOUS
  Filled 2019-05-14: qty 100

## 2019-05-14 MED ORDER — EPINEPHRINE HCL 5 MG/250ML IV SOLN IN NS
0.0000 ug/min | INTRAVENOUS | Status: DC
  Filled 2019-05-14: qty 250

## 2019-05-14 MED ORDER — POTASSIUM CHLORIDE 2 MEQ/ML IV SOLN
80.0000 meq | INTRAVENOUS | Status: DC
  Filled 2019-05-14: qty 40

## 2019-05-14 MED ORDER — VANCOMYCIN HCL 10 G IV SOLR
1250.0000 mg | INTRAVENOUS | Status: AC
  Administered 2019-05-15: 1250 mg via INTRAVENOUS
  Filled 2019-05-14: qty 1250

## 2019-05-14 MED ORDER — TRANEXAMIC ACID (OHS) PUMP PRIME SOLUTION
2.0000 mg/kg | INTRAVENOUS | Status: DC
  Filled 2019-05-14: qty 1.65

## 2019-05-14 MED ORDER — TEMAZEPAM 15 MG PO CAPS
15.0000 mg | ORAL_CAPSULE | Freq: Once | ORAL | Status: AC | PRN
  Administered 2019-05-14: 23:00:00 15 mg via ORAL
  Filled 2019-05-14: qty 1

## 2019-05-14 MED ORDER — CHLORHEXIDINE GLUCONATE 0.12 % MT SOLN
15.0000 mL | Freq: Once | OROMUCOSAL | Status: AC
  Administered 2019-05-15: 05:00:00 15 mL via OROMUCOSAL
  Filled 2019-05-14: qty 15

## 2019-05-14 NOTE — Progress Notes (Addendum)
Pre op vascular (carotid, Allen's test, ABI) and LE vein mapping have been completed.  Preliminary results under CV Proc in chart review.  June Leap, BS, RDMS, RVT

## 2019-05-14 NOTE — Progress Notes (Signed)
Progress Note  Patient Name: Megan [name] Date of Encounter: 05/14/2019  Primary Cardiologist: Mertie Moores, MD   Subjective   No recurrence of chest pain since admission.  Since allergic to aspirin, she is receiving IV Aggrastat.  A single dose of Plavix was administered in the emergency room prior to admission.  Inpatient Medications    Scheduled Meds: . atorvastatin  80 mg Oral q1800  . Chlorhexidine Gluconate Cloth  6 each Topical Q0600  . [START ON 05/15/2019] epinephrine  0-10 mcg/min Intravenous To OR  . [START ON 05/15/2019] heparin-papaverine-plasmalyte irrigation   Irrigation To OR  . insulin aspart  0-15 Units Subcutaneous TID WC  . [START ON 05/15/2019] insulin   Intravenous To OR  . levothyroxine  50 mcg Oral Q0600  . [START ON 05/15/2019] magnesium sulfate  40 mEq Other To OR  . metoprolol tartrate  12.5 mg Oral BID  . mupirocin ointment  1 application Nasal BID  . pantoprazole  40 mg Oral Daily  . [START ON 05/15/2019] phenylephrine  30-200 mcg/min Intravenous To OR  . [START ON 05/15/2019] potassium chloride  80 mEq Other To OR  . sodium chloride flush  3 mL Intravenous Q12H  . sodium chloride flush  3 mL Intravenous Q12H  . torsemide  10 mg Oral Daily  . [START ON 05/15/2019] tranexamic acid  15 mg/kg Intravenous To OR  . [START ON 05/15/2019] tranexamic acid  2 mg/kg Intracatheter To OR   Continuous Infusions: . sodium chloride    . [START ON 05/15/2019] dexmedetomidine    . [START ON 05/15/2019] DOPamine    . [START ON 05/15/2019] heparin 30,000 units/NS 1000 mL solution for CELLSAVER    . heparin 1,100 Units/hr (05/14/19 0830)  . [START ON 05/15/2019] levofloxacin (LEVAQUIN) IV    . [START ON 05/15/2019] milrinone    . nitroGLYCERIN    . [START ON 05/15/2019] nitroGLYCERIN    . tirofiban 0.15 mcg/kg/min (05/14/19 0800)  . [START ON 05/15/2019] tranexamic acid (CYKLOKAPRON) infusion ([facility])    . [START ON 05/15/2019] vancomycin     PRN Meds: sodium chloride,  acetaminophen, nitroGLYCERIN, ondansetron (ZOFRAN) IV, sodium chloride flush   Vital Signs    Vitals:   05/14/19 0700 05/14/19 0755 05/14/19 0800 05/14/19 0900  BP: (!) 119/56  (!) 97/38 (!) 111/46  Pulse: 84  89 86  Resp: (!) 21  (!) 21 14  Temp:  98.6 F (37 C)    TempSrc:  Oral    SpO2: 99%  99% 99%  Weight:      Height:        Intake/Output Summary (Last 24 hours) at 05/14/2019 1027 Last data filed at 05/14/2019 0800 Gross per 24 hour  Intake 778.24 ml  Output 1200 ml  Net -421.76 ml   Last 3 Weights 05/14/2019 05/13/2019 04/12/2019  Weight (lbs) 181 lb 14.1 oz 180 lb 180 lb  Weight (kg) 82.5 kg 81.647 kg 81.647 kg      Telemetry    Normal sinus rhythm- Personally Reviewed  ECG    Sinus rhythm, left bundle, left axis deviation, significant poor R wave progression.  No acute ST-T wave changes are noted.,- Personally Reviewed  Physical Exam  Appears older than stated age.  Lying in bed. GEN: No acute distress.   Neck: No JVD Cardiac: RRR, no murmurs, or rubs.  No gallops.  Respiratory: Clear to auscultation bilaterally. GI: Soft, nontender, non-distended  MS: No edema; No deformity. Neuro:  Nonfocal  Psych:  Normal affect   Labs    High Sensitivity Troponin:   Recent Labs  Lab 05/13/19 0759 05/13/19 1031  TROPONINIHS 431* 3,549*      Chemistry Recent Labs  Lab 05/13/19 0759  NA 143  K 3.6  CL 107  CO2 23  GLUCOSE 158*  BUN 23  CREATININE 0.82  CALCIUM 8.9  PROT 7.0  ALBUMIN 3.2*  AST 22  ALT 14  ALKPHOS 68  BILITOT 0.4  GFRNONAA >60  GFRAA >60  ANIONGAP 13     Hematology Recent Labs  Lab 05/13/19 0759 05/13/19 2320 05/14/19 0710  WBC 8.2  --  7.3  RBC 4.05  --  3.44*  HGB 10.9* 9.2* 9.6*  HCT 36.5 30.4* 30.7*  MCV 90.1  --  89.2  MCH 26.9  --  27.9  MCHC 29.9*  --  31.3  RDW 15.5  --  15.4  PLT 373  --  332    BNPNo results for input(s): BNP, PROBNP in the last 168 hours.   DDimer No results for input(s): DDIMER in the  last 168 hours.   Radiology    Dg Chest Portable 1 View  Result Date: 05/13/2019 CLINICAL DATA:  Chest pain. EXAM: PORTABLE CHEST 1 VIEW COMPARISON:  Radiograph Jan 09, 2019. FINDINGS: Stable cardiomediastinal silhouette. No pneumothorax is noted. Stable bibasilar subsegmental atelectasis or edema is noted. Small right pleural effusion is noted. Bony thorax is unremarkable. IMPRESSION: Stable bibasilar subsegmental atelectasis or edema is noted. Small right pleural effusion. Electronically Signed   By: Marijo Conception M.D.   On: 05/13/2019 08:23    Cardiac Studies   2D Doppler echocardiogram 05/13/2019: IMPRESSIONS    1. The left ventricle has severely reduced systolic function, with an ejection fraction of 25-30%. The cavity size was normal. There is mildly increased left ventricular wall thickness. Left ventricular diastolic Doppler parameters are consistent with  impaired relaxation. Mid to apical anteroseptal and inferoseptal akinesis. Apical anterior, apical lateral, and apical inferior akinesis. Akinesis of the true apex. No LV thrombus noted.  2. Normal RV size with mildly decreased systolic function.  3. There is mild mitral annular calcification present. No evidence of mitral valve stenosis. Trivial mitral regurgitation.  4. The aortic root is normal in size and structure.  5. The aortic valve is tricuspid. No stenosis of the aortic valve.  6. Left atrial size was mildly dilated.  7. The IVC was normal in size with PA systolic pressure 24 mmHg.   Cardiac catheterization 05/13/2019: Diagnostic Dominance: Right    Patient Profile     73 y.o. female with significant chronic medical problems including prior history of lower extremity cellulitis, CKD stage III, diabetes mellitus type 2 with poor control, physical deconditioning, allergy to aspirin, obstructive sleep apnea, anemia, with unstable angina/non-ST elevation myocardial infarction.  Same-day cath demonstrated severe  calcified CAD involving the ostial LAD and left main felt to represent surgical anatomy.  Assessment & Plan    1. Acute coronary syndrome/non-ST elevation myocardial infarction: Plan evaluation for CABG. 2. Acute on chronic systolic heart failure: LV function by echo is worse than noted by hand-injection in the Cath Lab.  EF is 25 to 30%.  Assumption is the anterior wall has viable myocardium and that function will improve with better blood flow to the LAD. 3. Diabetes mellitus type 2: Currently stable 4. CKD stage III: Kidney function is at risk with cardiac surgery. 5. DNR was previously unrecognized by our team.  Full CODE  STATUS has been restored after conversation with the patient and her power of attorney sister.    For questions or updates, please contact [place] Please consult [email or web address] for contact info under        Signed, Sinclair Grooms, MD  05/14/2019, 10:27 AM

## 2019-05-14 NOTE — Progress Notes (Signed)
1110-1200 Pt and younger sister with lots of good questions and information re pt's activity. Pt has difficulty with right ankle and left knee, has lift chair at home and has help 4 hours in morning and 4 hours in evening that she pays for. Pt lives alone and can walk short distances with walker. Would recommend PT consult to assist in pt's evaluation. Helped pt's RN get pt to [facility] and pt took a few steps holding to walker with much pressure on arms. Gave pt [facility] booket and staying in the tube handout. Discussed sternal precautions. Gave IS and pt able to demonstrate 1000 ml correctly. Wrote down how to view preop video. Pt had questions about rehab after surgery and discharge. Told her that case manager will help with discharge needs once we get to that point. Pt just recently discharged from a Rehab facility. Will follow up after surgery.  Graylon Good RN BSN 05/14/2019 12:00 PM

## 2019-05-14 NOTE — Research (Signed)
[facility] research study protocol reviewed with patient. Questions encouraged and answered. ICF and Pamphlet left for patient to review. Research will follow up this afternoon. Patient does not think she will participate, but will look at information.

## 2019-05-14 NOTE — Research (Signed)
Patient declined to participate in the [facility] research study

## 2019-05-14 NOTE — Progress Notes (Signed)
[place] for heparin Indication: chest pain/ACS  Allergies  Allergen Reactions  . Aspirin Anaphylaxis  . Bee Pollen     Extreme swelling due to bee stings  . Codeine Nausea And Vomiting  . Fish Oil Diarrhea  . Sulfa Antibiotics Nausea And Vomiting  . Iron Other (See Comments)    Severe Headache  . Camphor Dermatitis  . Camphor Dermatitis  . Lisinopril Cough  . Penicillins Rash    DID THE REACTION INVOLVE: Swelling of the face/tongue/throat, SOB, or low BP? Yes Sudden or severe rash/hives, skin peeling, or the inside of the mouth or nose? Unknown Did it require medical treatment? Unknown When did it last happen? If all above answers are "NO", may proceed with cephalosporin use.   . Sulfasalazine Nausea And Vomiting  . Vicodin [Hydrocodone-Acetaminophen] Nausea And Vomiting    Patient Measurements: Height: 5\' 3"  (160 cm) Weight: 181 lb 14.1 oz (82.5 kg) IBW/kg (Calculated) : 52.4 Heparin Dosing Weight: 70.3kg  Vital Signs: Temp: 98.6 F (37 C) (09/15 0755) Temp Source: Oral (09/15 0755) BP: 101/55 (09/15 1800) Pulse Rate: 86 (09/15 1600)  Labs: Recent Labs    05/13/19 0759 05/13/19 1031 05/13/19 2320 05/14/19 0710 05/14/19 1652  HGB 10.9*  --  9.2* 9.6*  --   HCT 36.5  --  30.4* 30.7*  --   PLT 373  --   --  332  --   HEPARINUNFRC  --   --   --  <0.10* 0.18*  CREATININE 0.82  --   --   --   --   TROPONINIHS 431* 3,549*  --   --   --     Estimated Creatinine Clearance: 62.1 mL/min (by C-G formula based on SCr of 0.82 mg/dL).   Medical History: Past Medical History:  Diagnosis Date  . Allergy    bee stings  . Anxiety   . Asthmatic bronchitis   . Broken arm 1957/2008   right  . Cataract    had surgery   . Depression   . Diabetes mellitus without complication ([place])    prediabetes diet controlled  . Diverticulosis   . GERD (gastroesophageal reflux disease)   . Glaucoma    BIL  . Hemorrhoids   . Hx of  adenomatous colonic polyps 12/02/2010  . Hyperlipidemia   . Hypertension   . Hypothyroidism 05/2018  . Inflammatory arthritis   . Knee bursitis   . Migraines   . Obesity   . Osteoarthritis   . Sleep apnea     Medications:  Infusions:  . sodium chloride    . [START ON 05/15/2019] dexmedetomidine    . [START ON 05/15/2019] DOPamine    . [START ON 05/15/2019] heparin 30,000 units/NS 1000 mL solution for CELLSAVER    . heparin 1,100 Units/hr (05/14/19 1800)  . [START ON 05/15/2019] levofloxacin (LEVAQUIN) IV    . [START ON 05/15/2019] milrinone    . nitroGLYCERIN    . [START ON 05/15/2019] nitroGLYCERIN    . tirofiban 0.15 mcg/kg/min (05/14/19 1800)  . [START ON 05/15/2019] tranexamic acid (CYKLOKAPRON) infusion ([facility])    . [START ON 05/15/2019] vancomycin      Assessment: 54 yof presented to the ED with CP.   Patient with severe LAD disease on cath this afternoon, location not amenable to PCI, TCTS consulted for surgery recommendations. Unfortunately patient has an aspirin allergy and was given 300mg  of plavix prior to cath. Orders to start tirofiban for for antiplatelet  with asa allergy, and restart heparin 8 hours after sheath pull (~1430).  Heparin level this PM 0.18. No infusion or bleeding issues noted.  Goal of Therapy:  Heparin level 0.3-0.7 units/ml Monitor platelets by anticoagulation protocol: Yes   Plan:  Increase heparin to 1250 units/hr Check 8 hr heparin level Daily heparin level, CBC Tirofiban 0.69mcg/kg/min until surgery, plan to stop 6 hours prior to surgery  Alanda Slim, PharmD, [place] Pharmacist Please see AMION for all Pharmacists' Contact Phone Numbers 05/14/2019, 6:51 PM

## 2019-05-14 NOTE — Anesthesia Preprocedure Evaluation (Addendum)
Anesthesia Evaluation  Patient identified by MRN, date of birth, ID band Patient awake    Reviewed: Allergy & Precautions, NPO status , Patient's Chart, lab work & pertinent test results  Airway Mallampati: II  TM Distance: >3 FB Neck ROM: Full    Dental  (+) Teeth Intact, Dental Advisory Given   Pulmonary asthma , sleep apnea , former smoker,    breath sounds clear to auscultation       Cardiovascular hypertension, + Past MI and +CHF   Rhythm:Regular Rate:Normal     Neuro/Psych  Headaches, Anxiety Depression    GI/Hepatic Neg liver ROS, GERD  ,  Endo/Other  diabetesHypothyroidism   Renal/GU Renal disease     Musculoskeletal  (+) Arthritis ,   Abdominal Normal abdominal exam  (+)   Peds  Hematology   Anesthesia Other Findings   Reproductive/Obstetrics                           Lab Results  Component Value Date   WBC 7.3 05/14/2019   HGB 9.6 (L) 05/14/2019   HCT 30.7 (L) 05/14/2019   MCV 89.2 05/14/2019   PLT 332 05/14/2019   Lab Results  Component Value Date   CREATININE 0.82 05/13/2019   BUN 23 05/13/2019   NA 143 05/13/2019   K 3.6 05/13/2019   CL 107 05/13/2019   CO2 23 05/13/2019   No results found for: INR, PROTIME  Echo:  1. The left ventricle has severely reduced systolic function, with an ejection fraction of 25-30%. The cavity size was normal. There is mildly increased left ventricular wall thickness. Left ventricular diastolic Doppler parameters are consistent with  impaired relaxation. Mid to apical anteroseptal and inferoseptal akinesis. Apical anterior, apical lateral, and apical inferior akinesis. Akinesis of the true apex. No LV thrombus noted.  2. Normal RV size with mildly decreased systolic function.  3. There is mild mitral annular calcification present. No evidence of mitral valve stenosis. Trivial mitral regurgitation.  4. The aortic root is normal in size and  structure.  5. The aortic valve is tricuspid. No stenosis of the aortic valve.  6. Left atrial size was mildly dilated.  7. The IVC was normal in size with PA systolic pressure 24 mmHg.  Anesthesia Physical Anesthesia Plan  ASA: IV  Anesthesia Plan: General   Post-op Pain Management:    Induction: Intravenous  PONV Risk Score and Plan: 3 and Ondansetron, Dexamethasone and Treatment may vary due to age or medical condition  Airway Management Planned: Oral ETT  Additional Equipment: Arterial line, CVP, PA Cath, TEE and Ultrasound Guidance Line Placement  Intra-op Plan:   Post-operative Plan: Post-operative intubation/ventilation  Informed Consent: I have reviewed the patients History and Physical, chart, labs and discussed the procedure including the risks, benefits and alternatives for the proposed anesthesia with the patient or authorized representative who has indicated his/her understanding and acceptance.     Dental advisory given  Plan Discussed with: CRNA  Anesthesia Plan Comments:        Anesthesia Quick Evaluation

## 2019-05-14 NOTE — Progress Notes (Signed)
[place] for heparin Indication: chest pain/ACS  Allergies  Allergen Reactions  . Aspirin Anaphylaxis  . Bee Pollen     Extreme swelling due to bee stings  . Codeine Nausea And Vomiting  . Fish Oil Diarrhea  . Sulfa Antibiotics Nausea And Vomiting  . Iron Other (See Comments)    Severe Headache  . Camphor Dermatitis  . Camphor Dermatitis  . Lisinopril Cough  . Penicillins Rash    DID THE REACTION INVOLVE: Swelling of the face/tongue/throat, SOB, or low BP? Yes Sudden or severe rash/hives, skin peeling, or the inside of the mouth or nose? Unknown Did it require medical treatment? Unknown When did it last happen? If all above answers are "NO", may proceed with cephalosporin use.   . Sulfasalazine Nausea And Vomiting  . Vicodin [Hydrocodone-Acetaminophen] Nausea And Vomiting    Patient Measurements: Height: 5\' 3"  (160 cm) Weight: 181 lb 14.1 oz (82.5 kg) IBW/kg (Calculated) : 52.4 Heparin Dosing Weight: 70.3kg  Vital Signs: Temp: 98.6 F (37 C) (09/15 0755) Temp Source: Oral (09/15 0755) BP: 97/38 (09/15 0800) Pulse Rate: 89 (09/15 0800)  Labs: Recent Labs    05/13/19 0759 05/13/19 1031 05/13/19 2320 05/14/19 0710  HGB 10.9*  --  9.2* 9.6*  HCT 36.5  --  30.4* 30.7*  PLT 373  --   --  332  HEPARINUNFRC  --   --   --  <0.10*  CREATININE 0.82  --   --   --   TROPONINIHS 431* 3,549*  --   --     Estimated Creatinine Clearance: 62.1 mL/min (by C-G formula based on SCr of 0.82 mg/dL).   Medical History: Past Medical History:  Diagnosis Date  . Allergy    bee stings  . Anxiety   . Asthmatic bronchitis   . Broken arm 1957/2008   right  . Cataract    had surgery   . Depression   . Diabetes mellitus without complication ([place])    prediabetes diet controlled  . Diverticulosis   . GERD (gastroesophageal reflux disease)   . Glaucoma    BIL  . Hemorrhoids   . Hx of adenomatous colonic polyps 12/02/2010  .  Hyperlipidemia   . Hypertension   . Hypothyroidism 05/2018  . Inflammatory arthritis   . Knee bursitis   . Migraines   . Obesity   . Osteoarthritis   . Sleep apnea     Medications:  Infusions:  . sodium chloride    . heparin 900 Units/hr (05/14/19 0800)  . nitroGLYCERIN    . tirofiban 0.15 mcg/kg/min (05/14/19 0800)    Assessment: 62 yof presented to the ED with CP.   Patient with severe LAD disease on cath this afternoon, location not amenable to PCI, TCTS consulted for surgery recommendations. Unfortunately patient has an aspirin allergy and was given 300mg  of plavix prior to cath. Orders to start tirofiban for for antiplatelet with asa allergy, and restart heparin 8 hours after sheath pull (~1430).  Initial heparin level is undetectable. Heparin drip paused this AM for about 10-15 mins per RN for lab draws. No other infusion issues noted.  CBC stable, no bleeding reported  Goal of Therapy:  Heparin level 0.3-0.7 units/ml Monitor platelets by anticoagulation protocol: Yes   Plan:  Increase heparin to 1100 units/hr Check 8 hr heparin level Daily heparin level, CBC Tirofiban 0.8mcg/kg/min until surgery, plan to stop 6 hours prior to surgery  Vertis Kelch, PharmD PGY2 Cardiology Pharmacy  Resident Phone [phone] 05/14/2019       8:26 AM  Please check [email or web address] for unit-specific pharmacist phone numbers

## 2019-05-15 ENCOUNTER — Inpatient Hospital Stay (HOSPITAL_COMMUNITY): Payer: Medicare Other | Admitting: Anesthesiology

## 2019-05-15 ENCOUNTER — Encounter (HOSPITAL_COMMUNITY): Payer: Self-pay | Admitting: Critical Care Medicine

## 2019-05-15 ENCOUNTER — Inpatient Hospital Stay (HOSPITAL_COMMUNITY): Payer: Medicare Other

## 2019-05-15 ENCOUNTER — Inpatient Hospital Stay (HOSPITAL_COMMUNITY)
Admission: EM | Disposition: A | Discharge status: Skilled Nursing Facility | Payer: Self-pay | Source: Home / Self Care | Attending: Cardiothoracic Surgery

## 2019-05-15 DIAGNOSIS — I25110 Atherosclerotic heart disease of native coronary artery with unstable angina pectoris: Secondary | ICD-10-CM

## 2019-05-15 DIAGNOSIS — Z951 Presence of aortocoronary bypass graft: Secondary | ICD-10-CM

## 2019-05-15 HISTORY — PX: TEE WITHOUT CARDIOVERSION: SHX5443

## 2019-05-15 HISTORY — PX: CORONARY ARTERY BYPASS GRAFT: SHX141

## 2019-05-15 LAB — POCT I-STAT EG7
Acid-Base Excess: 2 mmol/L (ref 0.0–2.0)
Bicarbonate: 27.6 mmol/L (ref 20.0–28.0)
Calcium, Ion: 1.07 mmol/L — ABNORMAL LOW (ref 1.15–1.40)
HCT: 22 % — ABNORMAL LOW (ref 36.0–46.0)
Hemoglobin: 7.5 g/dL — ABNORMAL LOW (ref 12.0–15.0)
O2 Saturation: 63 %
Potassium: 3.6 mmol/L (ref 3.5–5.1)
Sodium: 146 mmol/L — ABNORMAL HIGH (ref 135–145)
TCO2: 29 mmol/L (ref 22–32)
pCO2, Ven: 47.3 mmHg (ref 44.0–60.0)
pH, Ven: 7.373 (ref 7.250–7.430)
pO2, Ven: 34 mmHg (ref 32.0–45.0)

## 2019-05-15 LAB — POCT I-STAT 7, (LYTES, BLD GAS, ICA,H+H)
Acid-Base Excess: 1 mmol/L (ref 0.0–2.0)
Acid-Base Excess: 1 mmol/L (ref 0.0–2.0)
Acid-base deficit: 3 mmol/L — ABNORMAL HIGH (ref 0.0–2.0)
Acid-base deficit: 4 mmol/L — ABNORMAL HIGH (ref 0.0–2.0)
Acid-base deficit: 5 mmol/L — ABNORMAL HIGH (ref 0.0–2.0)
Acid-base deficit: 8 mmol/L — ABNORMAL HIGH (ref 0.0–2.0)
Bicarbonate: 25.5 mmol/L (ref 20.0–28.0)
Bicarbonate: 26.2 mmol/L (ref 20.0–28.0)
Bicarbonate: 23.2 mmol/L (ref 20.0–28.0)
Bicarbonate: 22.9 mmol/L (ref 20.0–28.0)
Bicarbonate: 20.9 mmol/L (ref 20.0–28.0)
Bicarbonate: 18.2 mmol/L — ABNORMAL LOW (ref 20.0–28.0)
Calcium, Ion: 1.11 mmol/L — ABNORMAL LOW (ref 1.15–1.40)
Calcium, Ion: 1.26 mmol/L (ref 1.15–1.40)
Calcium, Ion: 1.31 mmol/L (ref 1.15–1.40)
Calcium, Ion: 1.32 mmol/L (ref 1.15–1.40)
Calcium, Ion: 1.28 mmol/L (ref 1.15–1.40)
Calcium, Ion: 1.16 mmol/L (ref 1.15–1.40)
HCT: 24 % — ABNORMAL LOW (ref 36.0–46.0)
HCT: 23 % — ABNORMAL LOW (ref 36.0–46.0)
HCT: 33 % — ABNORMAL LOW (ref 36.0–46.0)
HCT: 28 % — ABNORMAL LOW (ref 36.0–46.0)
HCT: 28 % — ABNORMAL LOW (ref 36.0–46.0)
HCT: 24 % — ABNORMAL LOW (ref 36.0–46.0)
Hemoglobin: 8.2 g/dL — ABNORMAL LOW (ref 12.0–15.0)
Hemoglobin: 7.8 g/dL — ABNORMAL LOW (ref 12.0–15.0)
Hemoglobin: 11.2 g/dL — ABNORMAL LOW (ref 12.0–15.0)
Hemoglobin: 9.5 g/dL — ABNORMAL LOW (ref 12.0–15.0)
Hemoglobin: 9.5 g/dL — ABNORMAL LOW (ref 12.0–15.0)
Hemoglobin: 8.2 g/dL — ABNORMAL LOW (ref 12.0–15.0)
O2 Saturation: 100 %
O2 Saturation: 100 %
O2 Saturation: 97 %
O2 Saturation: 96 %
O2 Saturation: 93 %
O2 Saturation: 96 %
Patient temperature: 36.5
Patient temperature: 36.4
Patient temperature: 36.4
Patient temperature: 36.7
Potassium: 3.8 mmol/L (ref 3.5–5.1)
Potassium: 4.3 mmol/L (ref 3.5–5.1)
Potassium: 4 mmol/L (ref 3.5–5.1)
Potassium: 4.1 mmol/L (ref 3.5–5.1)
Potassium: 3.8 mmol/L (ref 3.5–5.1)
Potassium: 3.1 mmol/L — ABNORMAL LOW (ref 3.5–5.1)
Sodium: 142 mmol/L (ref 135–145)
Sodium: 141 mmol/L (ref 135–145)
Sodium: 142 mmol/L (ref 135–145)
Sodium: 142 mmol/L (ref 135–145)
Sodium: 143 mmol/L (ref 135–145)
Sodium: 146 mmol/L — ABNORMAL HIGH (ref 135–145)
TCO2: 27 mmol/L (ref 22–32)
TCO2: 27 mmol/L (ref 22–32)
TCO2: 25 mmol/L (ref 22–32)
TCO2: 24 mmol/L (ref 22–32)
TCO2: 22 mmol/L (ref 22–32)
TCO2: 19 mmol/L — ABNORMAL LOW (ref 22–32)
pCO2 arterial: 40.3 mmHg (ref 32.0–48.0)
pCO2 arterial: 42.1 mmHg (ref 32.0–48.0)
pCO2 arterial: 45.6 mmHg (ref 32.0–48.0)
pCO2 arterial: 47.2 mmHg (ref 32.0–48.0)
pCO2 arterial: 40.6 mmHg (ref 32.0–48.0)
pCO2 arterial: 40 mmHg (ref 32.0–48.0)
pH, Arterial: 7.409 (ref 7.350–7.450)
pH, Arterial: 7.402 (ref 7.350–7.450)
pH, Arterial: 7.313 — ABNORMAL LOW (ref 7.350–7.450)
pH, Arterial: 7.291 — ABNORMAL LOW (ref 7.350–7.450)
pH, Arterial: 7.317 — ABNORMAL LOW (ref 7.350–7.450)
pH, Arterial: 7.263 — ABNORMAL LOW (ref 7.350–7.450)
pO2, Arterial: 371 mmHg — ABNORMAL HIGH (ref 83.0–108.0)
pO2, Arterial: 282 mmHg — ABNORMAL HIGH (ref 83.0–108.0)
pO2, Arterial: 93 mmHg (ref 83.0–108.0)
pO2, Arterial: 92 mmHg (ref 83.0–108.0)
pO2, Arterial: 70 mmHg — ABNORMAL LOW (ref 83.0–108.0)
pO2, Arterial: 96 mmHg (ref 83.0–108.0)

## 2019-05-15 LAB — CBC
HCT: 25 % — ABNORMAL LOW (ref 36.0–46.0)
HCT: 33.9 % — ABNORMAL LOW (ref 36.0–46.0)
HCT: 28.7 % — ABNORMAL LOW (ref 36.0–46.0)
Hemoglobin: 7.8 g/dL — ABNORMAL LOW (ref 12.0–15.0)
Hemoglobin: 11.3 g/dL — ABNORMAL LOW (ref 12.0–15.0)
Hemoglobin: 9.6 g/dL — ABNORMAL LOW (ref 12.0–15.0)
MCH: 27.7 pg (ref 26.0–34.0)
MCH: 29.7 pg (ref 26.0–34.0)
MCH: 29.5 pg (ref 26.0–34.0)
MCHC: 31.2 g/dL (ref 30.0–36.0)
MCHC: 33.3 g/dL (ref 30.0–36.0)
MCHC: 33.4 g/dL (ref 30.0–36.0)
MCV: 88.7 fL (ref 80.0–100.0)
MCV: 89 fL (ref 80.0–100.0)
MCV: 88.3 fL (ref 80.0–100.0)
Platelets: 304 10*3/uL (ref 150–400)
Platelets: 229 10*3/uL (ref 150–400)
Platelets: 190 10*3/uL (ref 150–400)
RBC: 2.82 MIL/uL — ABNORMAL LOW (ref 3.87–5.11)
RBC: 3.81 MIL/uL — ABNORMAL LOW (ref 3.87–5.11)
RBC: 3.25 MIL/uL — ABNORMAL LOW (ref 3.87–5.11)
RDW: 15.3 % (ref 11.5–15.5)
RDW: 14.4 % (ref 11.5–15.5)
RDW: 14.6 % (ref 11.5–15.5)
WBC: 7.5 10*3/uL (ref 4.0–10.5)
WBC: 26.7 10*3/uL — ABNORMAL HIGH (ref 4.0–10.5)
WBC: 18 10*3/uL — ABNORMAL HIGH (ref 4.0–10.5)
nRBC: 0 % (ref 0.0–0.2)
nRBC: 0 % (ref 0.0–0.2)
nRBC: 0 % (ref 0.0–0.2)

## 2019-05-15 LAB — POCT I-STAT 4, (NA,K, GLUC, HGB,HCT)
Glucose, Bld: 127 mg/dL — ABNORMAL HIGH (ref 70–99)
Glucose, Bld: 142 mg/dL — ABNORMAL HIGH (ref 70–99)
Glucose, Bld: 194 mg/dL — ABNORMAL HIGH (ref 70–99)
Glucose, Bld: 178 mg/dL — ABNORMAL HIGH (ref 70–99)
Glucose, Bld: 187 mg/dL — ABNORMAL HIGH (ref 70–99)
HCT: 24 % — ABNORMAL LOW (ref 36.0–46.0)
HCT: 19 % — ABNORMAL LOW (ref 36.0–46.0)
HCT: 29 % — ABNORMAL LOW (ref 36.0–46.0)
HCT: 21 % — ABNORMAL LOW (ref 36.0–46.0)
HCT: 29 % — ABNORMAL LOW (ref 36.0–46.0)
Hemoglobin: 8.2 g/dL — ABNORMAL LOW (ref 12.0–15.0)
Hemoglobin: 6.5 g/dL — CL (ref 12.0–15.0)
Hemoglobin: 9.9 g/dL — ABNORMAL LOW (ref 12.0–15.0)
Hemoglobin: 7.1 g/dL — ABNORMAL LOW (ref 12.0–15.0)
Hemoglobin: 9.9 g/dL — ABNORMAL LOW (ref 12.0–15.0)
Potassium: 4.3 mmol/L (ref 3.5–5.1)
Potassium: 3.7 mmol/L (ref 3.5–5.1)
Potassium: 3.8 mmol/L (ref 3.5–5.1)
Potassium: 4.1 mmol/L (ref 3.5–5.1)
Potassium: 4.2 mmol/L (ref 3.5–5.1)
Sodium: 140 mmol/L (ref 135–145)
Sodium: 140 mmol/L (ref 135–145)
Sodium: 141 mmol/L (ref 135–145)
Sodium: 138 mmol/L (ref 135–145)
Sodium: 141 mmol/L (ref 135–145)

## 2019-05-15 LAB — ECHO INTRAOPERATIVE TEE
Height: 63 in
Weight: 2910.07 oz

## 2019-05-15 LAB — GLUCOSE, CAPILLARY
Glucose-Capillary: 129 mg/dL — ABNORMAL HIGH (ref 70–99)
Glucose-Capillary: 154 mg/dL — ABNORMAL HIGH (ref 70–99)
Glucose-Capillary: 129 mg/dL — ABNORMAL HIGH (ref 70–99)
Glucose-Capillary: 104 mg/dL — ABNORMAL HIGH (ref 70–99)
Glucose-Capillary: 110 mg/dL — ABNORMAL HIGH (ref 70–99)
Glucose-Capillary: 95 mg/dL (ref 70–99)
Glucose-Capillary: 126 mg/dL — ABNORMAL HIGH (ref 70–99)
Glucose-Capillary: 99 mg/dL (ref 70–99)
Glucose-Capillary: 130 mg/dL — ABNORMAL HIGH (ref 70–99)
Glucose-Capillary: 128 mg/dL — ABNORMAL HIGH (ref 70–99)

## 2019-05-15 LAB — HEPARIN LEVEL (UNFRACTIONATED): Heparin Unfractionated: 0.27 IU/mL — ABNORMAL LOW (ref 0.30–0.70)

## 2019-05-15 LAB — SURGICAL PCR SCREEN
MRSA, PCR: NEGATIVE
Staphylococcus aureus: NEGATIVE

## 2019-05-15 LAB — PREPARE RBC (CROSSMATCH)

## 2019-05-15 LAB — BASIC METABOLIC PANEL
Anion gap: 7 (ref 5–15)
Anion gap: 5 (ref 5–15)
BUN: 35 mg/dL — ABNORMAL HIGH (ref 8–23)
BUN: 34 mg/dL — ABNORMAL HIGH (ref 8–23)
CO2: 26 mmol/L (ref 22–32)
CO2: 21 mmol/L — ABNORMAL LOW (ref 22–32)
Calcium: 8.4 mg/dL — ABNORMAL LOW (ref 8.9–10.3)
Calcium: 7.7 mg/dL — ABNORMAL LOW (ref 8.9–10.3)
Chloride: 107 mmol/L (ref 98–111)
Chloride: 114 mmol/L — ABNORMAL HIGH (ref 98–111)
Creatinine, Ser: 0.81 mg/dL (ref 0.44–1.00)
Creatinine, Ser: 0.7 mg/dL (ref 0.44–1.00)
GFR calc Af Amer: >60 mL/min (ref >60)
GFR calc Af Amer: >60 mL/min (ref >60)
GFR calc non Af Amer: >60 mL/min (ref >60)
GFR calc non Af Amer: >60 mL/min (ref >60)
Glucose, Bld: 134 mg/dL — ABNORMAL HIGH (ref 70–99)
Glucose, Bld: 118 mg/dL — ABNORMAL HIGH (ref 70–99)
Potassium: 3.9 mmol/L (ref 3.5–5.1)
Potassium: 3.9 mmol/L (ref 3.5–5.1)
Sodium: 140 mmol/L (ref 135–145)
Sodium: 140 mmol/L (ref 135–145)

## 2019-05-15 LAB — PROTIME-INR
INR: 1.5 — ABNORMAL HIGH (ref 0.8–1.2)
Prothrombin Time: 17.5 seconds — ABNORMAL HIGH (ref 11.4–15.2)

## 2019-05-15 LAB — APTT: aPTT: 32 seconds (ref 24–36)

## 2019-05-15 LAB — HEMOGLOBIN AND HEMATOCRIT, BLOOD
HCT: 21.6 % — ABNORMAL LOW (ref 36.0–46.0)
Hemoglobin: 7.1 g/dL — ABNORMAL LOW (ref 12.0–15.0)

## 2019-05-15 LAB — MAGNESIUM: Magnesium: 2.1 mg/dL (ref 1.7–2.4)

## 2019-05-15 LAB — PLATELET COUNT: Platelets: 231 10*3/uL (ref 150–400)

## 2019-05-15 SURGERY — CORONARY ARTERY BYPASS GRAFTING (CABG)
Anesthesia: General | Site: Chest

## 2019-05-15 MED ORDER — INSULIN REGULAR(HUMAN) IN NACL 100-0.9 UT/100ML-% IV SOLN
INTRAVENOUS | Status: DC
  Administered 2019-05-16: 1.8 [IU]/h via INTRAVENOUS
  Filled 2019-05-15: qty 100

## 2019-05-15 MED ORDER — LACTATED RINGERS IV SOLN
INTRAVENOUS | Status: DC | PRN
  Administered 2019-05-15 (×2): via INTRAVENOUS

## 2019-05-15 MED ORDER — LACTATED RINGERS IV SOLN
INTRAVENOUS | Status: DC
  Administered 2019-05-15: 21:00:00 via INTRAVENOUS

## 2019-05-15 MED ORDER — LEVOTHYROXINE SODIUM 50 MCG PO TABS
50.0000 ug | ORAL_TABLET | Freq: Every day | ORAL | Status: DC
  Administered 2019-05-18 – 2019-05-29 (×12): 50 ug via ORAL
  Filled 2019-05-15 (×13): qty 1

## 2019-05-15 MED ORDER — DOCUSATE SODIUM 100 MG PO CAPS
200.0000 mg | ORAL_CAPSULE | Freq: Every day | ORAL | Status: DC
  Administered 2019-05-16 – 2019-05-28 (×7): 200 mg via ORAL
  Filled 2019-05-15 (×9): qty 2

## 2019-05-15 MED ORDER — OXYCODONE HCL 5 MG PO TABS
5.0000 mg | ORAL_TABLET | ORAL | Status: DC | PRN
  Administered 2019-05-17: 10 mg via ORAL
  Filled 2019-05-15: qty 2
  Filled 2019-05-15: qty 1

## 2019-05-15 MED ORDER — PROTAMINE SULFATE 10 MG/ML IV SOLN
INTRAVENOUS | Status: AC
  Filled 2019-05-15: qty 5

## 2019-05-15 MED ORDER — ALBUMIN HUMAN 5 % IV SOLN
INTRAVENOUS | Status: DC | PRN
  Administered 2019-05-15: 13:00:00 via INTRAVENOUS

## 2019-05-15 MED ORDER — BISACODYL 5 MG PO TBEC
10.0000 mg | DELAYED_RELEASE_TABLET | Freq: Every day | ORAL | Status: DC
  Administered 2019-05-16 – 2019-05-28 (×6): 10 mg via ORAL
  Filled 2019-05-15 (×8): qty 2

## 2019-05-15 MED ORDER — ROCURONIUM BROMIDE 10 MG/ML (PF) SYRINGE
PREFILLED_SYRINGE | INTRAVENOUS | Status: AC
  Filled 2019-05-15: qty 20

## 2019-05-15 MED ORDER — METOPROLOL TARTRATE 12.5 MG HALF TABLET
12.5000 mg | ORAL_TABLET | Freq: Two times a day (BID) | ORAL | Status: DC
  Administered 2019-05-16 – 2019-05-20 (×6): 12.5 mg via ORAL
  Filled 2019-05-15 (×8): qty 1

## 2019-05-15 MED ORDER — LEVOFLOXACIN IN D5W 750 MG/150ML IV SOLN
750.0000 mg | INTRAVENOUS | Status: AC
  Administered 2019-05-16: 750 mg via INTRAVENOUS
  Filled 2019-05-15: qty 150

## 2019-05-15 MED ORDER — NITROGLYCERIN IN D5W 200-5 MCG/ML-% IV SOLN: 0.0000 ug/min | INTRAVENOUS | Status: DC

## 2019-05-15 MED ORDER — ACETAMINOPHEN 500 MG PO TABS
1000.0000 mg | ORAL_TABLET | Freq: Four times a day (QID) | ORAL | Status: AC
  Administered 2019-05-16 – 2019-05-20 (×18): 1000 mg via ORAL
  Filled 2019-05-15 (×17): qty 2

## 2019-05-15 MED ORDER — SODIUM CHLORIDE 0.9 % IV SOLN
INTRAVENOUS | Status: DC
  Administered 2019-05-15: 15:00:00 via INTRAVENOUS

## 2019-05-15 MED ORDER — METOPROLOL TARTRATE 5 MG/5ML IV SOLN: 2.5000 mg | INTRAVENOUS | Status: DC | PRN

## 2019-05-15 MED ORDER — HEMOSTATIC AGENTS (NO CHARGE) OPTIME
TOPICAL | Status: DC | PRN
  Administered 2019-05-15 (×5): 1 via TOPICAL

## 2019-05-15 MED ORDER — 0.9 % SODIUM CHLORIDE (POUR BTL) OPTIME
TOPICAL | Status: DC | PRN
  Administered 2019-05-15: 5000 mL

## 2019-05-15 MED ORDER — SODIUM CHLORIDE 0.9% FLUSH: 3.0000 mL | INTRAVENOUS | Status: DC | PRN

## 2019-05-15 MED ORDER — DOPAMINE-DEXTROSE 3.2-5 MG/ML-% IV SOLN: 2.5000 ug/kg/min | INTRAVENOUS | Status: AC

## 2019-05-15 MED ORDER — MORPHINE SULFATE (PF) 2 MG/ML IV SOLN
1.0000 mg | INTRAVENOUS | Status: DC | PRN
  Administered 2019-05-15: 1 mg via INTRAVENOUS
  Administered 2019-05-16 (×6): 2 mg via INTRAVENOUS
  Filled 2019-05-15 (×7): qty 1

## 2019-05-15 MED ORDER — SODIUM BICARBONATE 8.4 % IV SOLN
INTRAVENOUS | Status: AC
  Administered 2019-05-15: 50 meq via INTRAVENOUS
  Filled 2019-05-15: qty 50

## 2019-05-15 MED ORDER — MAGNESIUM SULFATE 4 GM/100ML IV SOLN
4.0000 g | Freq: Once | INTRAVENOUS | Status: AC
  Administered 2019-05-15: 4 g via INTRAVENOUS
  Filled 2019-05-15: qty 100

## 2019-05-15 MED ORDER — DIPHENHYDRAMINE HCL 50 MG/ML IJ SOLN
INTRAMUSCULAR | Status: DC | PRN
  Administered 2019-05-15: 50 mg via INTRAVENOUS

## 2019-05-15 MED ORDER — CALCIUM CHLORIDE 10 % IV SOLN
INTRAVENOUS | Status: DC | PRN
  Administered 2019-05-15: 100 mg via INTRAVENOUS
  Administered 2019-05-15 (×2): 200 mg via INTRAVENOUS

## 2019-05-15 MED ORDER — PROPOFOL 10 MG/ML IV BOLUS
INTRAVENOUS | Status: AC
  Filled 2019-05-15: qty 20

## 2019-05-15 MED ORDER — SODIUM CHLORIDE 0.9% FLUSH: 10.0000 mL | INTRAVENOUS | Status: DC | PRN

## 2019-05-15 MED ORDER — NOREPINEPHRINE 4 MG/250ML-% IV SOLN
0.0000 ug/min | INTRAVENOUS | Status: DC
  Filled 2019-05-15: qty 250

## 2019-05-15 MED ORDER — AMIODARONE HCL IN DEXTROSE 360-4.14 MG/200ML-% IV SOLN
30.0000 mg/h | INTRAVENOUS | Status: DC
  Administered 2019-05-15 – 2019-05-20 (×11): 30 mg/h via INTRAVENOUS
  Filled 2019-05-15 (×11): qty 200

## 2019-05-15 MED ORDER — BISACODYL 10 MG RE SUPP
10.0000 mg | Freq: Every day | RECTAL | Status: DC
  Administered 2019-05-20: 10 mg via RECTAL
  Filled 2019-05-15 (×2): qty 1

## 2019-05-15 MED ORDER — VANCOMYCIN HCL IN DEXTROSE 1-5 GM/200ML-% IV SOLN
1000.0000 mg | Freq: Once | INTRAVENOUS | Status: AC
  Administered 2019-05-15: 22:00:00 1000 mg via INTRAVENOUS
  Filled 2019-05-15: qty 200

## 2019-05-15 MED ORDER — ROCURONIUM BROMIDE 10 MG/ML (PF) SYRINGE
PREFILLED_SYRINGE | INTRAVENOUS | Status: DC | PRN
  Administered 2019-05-15: 40 mg via INTRAVENOUS
  Administered 2019-05-15: 100 mg via INTRAVENOUS
  Administered 2019-05-15: 60 mg via INTRAVENOUS

## 2019-05-15 MED ORDER — POTASSIUM CHLORIDE 10 MEQ/50ML IV SOLN: 10.0000 meq | INTRAVENOUS | Status: AC

## 2019-05-15 MED ORDER — MIDAZOLAM HCL (PF) 10 MG/2ML IJ SOLN
INTRAMUSCULAR | Status: AC
  Filled 2019-05-15: qty 2

## 2019-05-15 MED ORDER — METOPROLOL TARTRATE 25 MG/10 ML ORAL SUSPENSION
12.5000 mg | Freq: Two times a day (BID) | ORAL | Status: DC
  Filled 2019-05-15 (×2): qty 5

## 2019-05-15 MED ORDER — SODIUM CHLORIDE 0.45 % IV SOLN
INTRAVENOUS | Status: DC | PRN
  Administered 2019-05-15: 14:00:00 via INTRAVENOUS
  Administered 2019-05-16: 10 mL/h via INTRAVENOUS

## 2019-05-15 MED ORDER — TRAMADOL HCL 50 MG PO TABS
50.0000 mg | ORAL_TABLET | ORAL | Status: DC | PRN
  Administered 2019-05-16 – 2019-05-18 (×3): 100 mg via ORAL
  Administered 2019-05-20 – 2019-05-21 (×2): 50 mg via ORAL
  Administered 2019-05-24 – 2019-05-27 (×5): 100 mg via ORAL
  Filled 2019-05-15 (×3): qty 2
  Filled 2019-05-15: qty 1
  Filled 2019-05-15: qty 2
  Filled 2019-05-15: qty 1
  Filled 2019-05-15 (×4): qty 2

## 2019-05-15 MED ORDER — DEXMEDETOMIDINE HCL IN NACL 400 MCG/100ML IV SOLN: 0.0000 ug/kg/h | INTRAVENOUS | Status: DC

## 2019-05-15 MED ORDER — SODIUM BICARBONATE 8.4 % IV SOLN
50.0000 meq | Freq: Once | INTRAVENOUS | Status: AC
  Administered 2019-05-15: 22:00:00 50 meq via INTRAVENOUS

## 2019-05-15 MED ORDER — FENTANYL CITRATE (PF) 250 MCG/5ML IJ SOLN
INTRAMUSCULAR | Status: AC
  Filled 2019-05-15: qty 5

## 2019-05-15 MED ORDER — CHLORHEXIDINE GLUCONATE 0.12 % MT SOLN
15.0000 mL | OROMUCOSAL | Status: AC
  Administered 2019-05-15: 15 mL via OROMUCOSAL

## 2019-05-15 MED ORDER — ARTIFICIAL TEARS OPHTHALMIC OINT
TOPICAL_OINTMENT | OPHTHALMIC | Status: AC
  Filled 2019-05-15: qty 10.5

## 2019-05-15 MED ORDER — ACETAMINOPHEN 160 MG/5ML PO SOLN: 650.0000 mg | Freq: Once | ORAL | Status: AC

## 2019-05-15 MED ORDER — AMIODARONE LOAD VIA INFUSION
150.0000 mg | Freq: Once | INTRAVENOUS | Status: AC
  Administered 2019-05-15: 150 mg via INTRAVENOUS
  Filled 2019-05-15: qty 83.34

## 2019-05-15 MED ORDER — ETOMIDATE 2 MG/ML IV SOLN
INTRAVENOUS | Status: AC
  Filled 2019-05-15: qty 10

## 2019-05-15 MED ORDER — AMIODARONE HCL IN DEXTROSE 360-4.14 MG/200ML-% IV SOLN
60.0000 mg/h | INTRAVENOUS | Status: AC
  Administered 2019-05-15: 11:00:00 60 mg/h via INTRAVENOUS
  Administered 2019-05-15: 14:00:00 via INTRAVENOUS
  Filled 2019-05-15 (×3): qty 200

## 2019-05-15 MED ORDER — VASOPRESSIN 20 UNIT/ML IV SOLN
INTRAVENOUS | Status: AC
  Filled 2019-05-15: qty 1

## 2019-05-15 MED ORDER — MIDAZOLAM HCL 2 MG/2ML IJ SOLN: 2.0000 mg | INTRAMUSCULAR | Status: DC | PRN

## 2019-05-15 MED ORDER — CHLORHEXIDINE GLUCONATE CLOTH 2 % EX PADS
6.0000 | MEDICATED_PAD | Freq: Every day | CUTANEOUS | Status: DC
  Administered 2019-05-15 – 2019-05-23 (×8): 6 via TOPICAL

## 2019-05-15 MED ORDER — MILRINONE LACTATE IN DEXTROSE 20-5 MG/100ML-% IV SOLN
0.1250 ug/kg/min | INTRAVENOUS | Status: AC
  Administered 2019-05-15 – 2019-05-16 (×2): 0.3 ug/kg/min via INTRAVENOUS
  Filled 2019-05-15 (×2): qty 100

## 2019-05-15 MED ORDER — PHENYLEPHRINE 40 MCG/ML (10ML) SYRINGE FOR IV PUSH (FOR BLOOD PRESSURE SUPPORT)
PREFILLED_SYRINGE | INTRAVENOUS | Status: AC
  Filled 2019-05-15: qty 20

## 2019-05-15 MED ORDER — LACTATED RINGERS IV SOLN: 500.0000 mL | Freq: Once | INTRAVENOUS | Status: DC | PRN

## 2019-05-15 MED ORDER — FENTANYL CITRATE (PF) 250 MCG/5ML IJ SOLN
INTRAMUSCULAR | Status: DC | PRN
  Administered 2019-05-15: 50 ug via INTRAVENOUS
  Administered 2019-05-15 (×3): 250 ug via INTRAVENOUS
  Administered 2019-05-15: 100 ug via INTRAVENOUS
  Administered 2019-05-15: 150 ug via INTRAVENOUS
  Administered 2019-05-15: 200 ug via INTRAVENOUS

## 2019-05-15 MED ORDER — ATORVASTATIN CALCIUM 80 MG PO TABS
80.0000 mg | ORAL_TABLET | Freq: Every day | ORAL | Status: DC
  Administered 2019-05-16 – 2019-05-28 (×13): 80 mg via ORAL
  Filled 2019-05-15 (×13): qty 1

## 2019-05-15 MED ORDER — SODIUM CHLORIDE 0.9% FLUSH
3.0000 mL | Freq: Two times a day (BID) | INTRAVENOUS | Status: DC
  Administered 2019-05-16 – 2019-05-19 (×6): 3 mL via INTRAVENOUS

## 2019-05-15 MED ORDER — PANTOPRAZOLE SODIUM 40 MG PO TBEC
40.0000 mg | DELAYED_RELEASE_TABLET | Freq: Every day | ORAL | Status: DC
  Administered 2019-05-17 – 2019-05-29 (×13): 40 mg via ORAL
  Filled 2019-05-15 (×13): qty 1

## 2019-05-15 MED ORDER — ARTIFICIAL TEARS OPHTHALMIC OINT
TOPICAL_OINTMENT | OPHTHALMIC | Status: DC | PRN
  Administered 2019-05-15: 1 via OPHTHALMIC

## 2019-05-15 MED ORDER — MIDAZOLAM HCL 5 MG/5ML IJ SOLN
INTRAMUSCULAR | Status: DC | PRN
  Administered 2019-05-15: 2 mg via INTRAVENOUS
  Administered 2019-05-15 (×2): 1 mg via INTRAVENOUS
  Administered 2019-05-15: 2 mg via INTRAVENOUS
  Administered 2019-05-15: 0.5 mg via INTRAVENOUS
  Administered 2019-05-15: 2 mg via INTRAVENOUS
  Administered 2019-05-15: 0.5 mg via INTRAVENOUS
  Administered 2019-05-15: 1 mg via INTRAVENOUS

## 2019-05-15 MED ORDER — ONDANSETRON HCL 4 MG/2ML IJ SOLN
4.0000 mg | Freq: Four times a day (QID) | INTRAMUSCULAR | Status: DC | PRN
  Administered 2019-05-16 – 2019-05-28 (×7): 4 mg via INTRAVENOUS
  Filled 2019-05-15 (×8): qty 2

## 2019-05-15 MED ORDER — FENTANYL CITRATE (PF) 250 MCG/5ML IJ SOLN
INTRAMUSCULAR | Status: AC
  Filled 2019-05-15: qty 25

## 2019-05-15 MED ORDER — ACETAMINOPHEN 650 MG RE SUPP
650.0000 mg | Freq: Once | RECTAL | Status: AC
  Administered 2019-05-15: 650 mg via RECTAL

## 2019-05-15 MED ORDER — SODIUM CHLORIDE (PF) 0.9 % IJ SOLN
INTRAMUSCULAR | Status: AC
  Filled 2019-05-15: qty 20

## 2019-05-15 MED ORDER — LACTATED RINGERS IV SOLN
INTRAVENOUS | Status: DC | PRN
  Administered 2019-05-15: 08:00:00 via INTRAVENOUS

## 2019-05-15 MED ORDER — PLASMA-LYTE 148 IV SOLN
INTRAVENOUS | Status: DC | PRN
  Administered 2019-05-15: 500 mL via INTRAVASCULAR

## 2019-05-15 MED ORDER — LABETALOL HCL 5 MG/ML IV SOLN
INTRAVENOUS | Status: AC
  Filled 2019-05-15: qty 4

## 2019-05-15 MED ORDER — HEPARIN SODIUM (PORCINE) 1000 UNIT/ML IJ SOLN
INTRAMUSCULAR | Status: AC
  Filled 2019-05-15: qty 1

## 2019-05-15 MED ORDER — SODIUM CHLORIDE 0.9% FLUSH
10.0000 mL | Freq: Two times a day (BID) | INTRAVENOUS | Status: DC
  Administered 2019-05-15 – 2019-05-19 (×8): 10 mL
  Administered 2019-05-20: 20 mL
  Administered 2019-05-21 – 2019-05-25 (×3): 10 mL

## 2019-05-15 MED ORDER — PROTAMINE SULFATE 10 MG/ML IV SOLN
INTRAVENOUS | Status: AC
  Filled 2019-05-15: qty 25

## 2019-05-15 MED ORDER — PHENYLEPHRINE 40 MCG/ML (10ML) SYRINGE FOR IV PUSH (FOR BLOOD PRESSURE SUPPORT)
PREFILLED_SYRINGE | INTRAVENOUS | Status: DC | PRN
  Administered 2019-05-15 (×2): 80 ug via INTRAVENOUS

## 2019-05-15 MED ORDER — PROTAMINE SULFATE 10 MG/ML IV SOLN
INTRAVENOUS | Status: DC | PRN
  Administered 2019-05-15: 10 mg via INTRAVENOUS
  Administered 2019-05-15: 100 mg via INTRAVENOUS
  Administered 2019-05-15 (×2): 50 mg via INTRAVENOUS
  Administered 2019-05-15: 80 mg via INTRAVENOUS

## 2019-05-15 MED ORDER — PHENYLEPHRINE HCL-NACL 20-0.9 MG/250ML-% IV SOLN
0.0000 ug/min | INTRAVENOUS | Status: DC
  Administered 2019-05-15: 22:00:00 80 ug/min via INTRAVENOUS
  Administered 2019-05-16: 65 ug/min via INTRAVENOUS
  Administered 2019-05-16: 70 ug/min via INTRAVENOUS
  Administered 2019-05-16: 80 ug/min via INTRAVENOUS
  Administered 2019-05-16: 70 ug/min via INTRAVENOUS
  Administered 2019-05-17: 10 ug/min via INTRAVENOUS
  Administered 2019-05-18: 40 ug/min via INTRAVENOUS
  Administered 2019-05-18: 60 ug/min via INTRAVENOUS
  Administered 2019-05-19: 03:00:00 50 ug/min via INTRAVENOUS
  Administered 2019-05-19: 30 ug/min via INTRAVENOUS
  Filled 2019-05-15 (×13): qty 250

## 2019-05-15 MED ORDER — LACTATED RINGERS IV SOLN: INTRAVENOUS | Status: DC

## 2019-05-15 MED ORDER — SODIUM CHLORIDE 0.9 % IV SOLN: 250.0000 mL | INTRAVENOUS | Status: DC

## 2019-05-15 MED ORDER — INSULIN REGULAR BOLUS VIA INFUSION
0.0000 [IU] | Freq: Three times a day (TID) | INTRAVENOUS | Status: DC
  Filled 2019-05-15: qty 10

## 2019-05-15 MED ORDER — FAMOTIDINE IN NACL 20-0.9 MG/50ML-% IV SOLN
20.0000 mg | Freq: Two times a day (BID) | INTRAVENOUS | Status: DC
  Administered 2019-05-15: 20 mg via INTRAVENOUS

## 2019-05-15 MED ORDER — ALBUMIN HUMAN 5 % IV SOLN
250.0000 mL | INTRAVENOUS | Status: AC | PRN
  Administered 2019-05-15 (×2): 12.5 g via INTRAVENOUS

## 2019-05-15 MED ORDER — ACETAMINOPHEN 160 MG/5ML PO SOLN: 1000.0000 mg | Freq: Four times a day (QID) | ORAL | Status: AC

## 2019-05-15 MED ORDER — HEPARIN SODIUM (PORCINE) 1000 UNIT/ML IJ SOLN
INTRAMUSCULAR | Status: DC | PRN
  Administered 2019-05-15: 29000 [IU] via INTRAVENOUS

## 2019-05-15 MED ORDER — ETOMIDATE 2 MG/ML IV SOLN
INTRAVENOUS | Status: DC | PRN
  Administered 2019-05-15: 10 mg via INTRAVENOUS

## 2019-05-15 SURGICAL SUPPLY — 83 items
BAG DECANTER FOR FLEXI CONT (MISCELLANEOUS) ×3 IMPLANT
BLADE CLIPPER SURG (BLADE) ×3 IMPLANT
BLADE STERNUM SYSTEM 6 (BLADE) ×3 IMPLANT
BNDG ELASTIC 4X5.8 VLCR STR LF (GAUZE/BANDAGES/DRESSINGS) ×3 IMPLANT
BNDG ELASTIC 6X15 VLCR STRL LF (GAUZE/BANDAGES/DRESSINGS) ×3 IMPLANT
BNDG ELASTIC 6X5.8 VLCR STR LF (GAUZE/BANDAGES/DRESSINGS) ×3 IMPLANT
BNDG GAUZE ELAST 4 BULKY (GAUZE/BANDAGES/DRESSINGS) ×3 IMPLANT
CANISTER SUCT 3000ML PPV (MISCELLANEOUS) ×3 IMPLANT
CANNULA VEN 2 STAGE (MISCELLANEOUS) ×3 IMPLANT
CATH CPB KIT GERHARDT (MISCELLANEOUS) ×3 IMPLANT
CATH THORACIC 28FR (CATHETERS) ×3 IMPLANT
COVER WAND RF STERILE (DRAPES) IMPLANT
DERMABOND ADVANCED (GAUZE/BANDAGES/DRESSINGS) ×1
DERMABOND ADVANCED .7 DNX12 (GAUZE/BANDAGES/DRESSINGS) ×2 IMPLANT
DRAIN CHANNEL 28F RND 3/8 FF (WOUND CARE) ×3 IMPLANT
DRAPE CARDIOVASCULAR INCISE (DRAPES) ×1
DRAPE SLUSH/WARMER DISC (DRAPES) ×3 IMPLANT
DRAPE SRG 135X102X78XABS (DRAPES) ×2 IMPLANT
DRESSING PREVENA PLUS CUSTOM (GAUZE/BANDAGES/DRESSINGS) ×2 IMPLANT
DRSG AQUACEL AG ADV 3.5X14 (GAUZE/BANDAGES/DRESSINGS) ×3 IMPLANT
DRSG PREVENA PLUS CUSTOM (GAUZE/BANDAGES/DRESSINGS) ×3
ELECT BLADE 4.0 EZ CLEAN MEGAD (MISCELLANEOUS) ×3
ELECT REM PT RETURN 9FT ADLT (ELECTROSURGICAL) ×6
ELECTRODE BLDE 4.0 EZ CLN MEGD (MISCELLANEOUS) ×2 IMPLANT
ELECTRODE REM PT RTRN 9FT ADLT (ELECTROSURGICAL) ×4 IMPLANT
FELT TEFLON 1X6 (MISCELLANEOUS) ×6 IMPLANT
GAUZE SPONGE 4X4 12PLY STRL (GAUZE/BANDAGES/DRESSINGS) ×6 IMPLANT
GAUZE SPONGE 4X4 12PLY STRL LF (GAUZE/BANDAGES/DRESSINGS) ×6 IMPLANT
GLOVE BIO SURGEON STRL SZ 6.5 (GLOVE) ×9 IMPLANT
GLOVE BIO SURGEON STRL SZ7.5 (GLOVE) ×6 IMPLANT
GLOVE BIOGEL M 6.5 STRL (GLOVE) ×12 IMPLANT
GLOVE BIOGEL PI IND STRL 6.5 (GLOVE) ×4 IMPLANT
GLOVE BIOGEL PI IND STRL 7.5 (GLOVE) ×6 IMPLANT
GLOVE BIOGEL PI INDICATOR 6.5 (GLOVE) ×2
GLOVE BIOGEL PI INDICATOR 7.5 (GLOVE) ×3
GOWN STRL REUS W/ TWL LRG LVL3 (GOWN DISPOSABLE) ×14 IMPLANT
GOWN STRL REUS W/TWL LRG LVL3 (GOWN DISPOSABLE) ×7
HEMOSTAT POWDER SURGIFOAM 1G (HEMOSTASIS) ×9 IMPLANT
HEMOSTAT SURGICEL 2X14 (HEMOSTASIS) ×3 IMPLANT
KIT BASIN OR (CUSTOM PROCEDURE TRAY) ×3 IMPLANT
KIT CATH SUCT 8FR (CATHETERS) ×3 IMPLANT
KIT SUCTION CATH 14FR (SUCTIONS) ×9 IMPLANT
KIT TURNOVER KIT B (KITS) ×3 IMPLANT
KIT VASOVIEW HEMOPRO 2 VH 4000 (KITS) ×3 IMPLANT
LEAD PACING MYOCARDI (MISCELLANEOUS) ×3 IMPLANT
MARKER GRAFT CORONARY BYPASS (MISCELLANEOUS) ×9 IMPLANT
NS IRRIG 1000ML POUR BTL (IV SOLUTION) ×15 IMPLANT
PACK E OPEN HEART (SUTURE) ×3 IMPLANT
PACK OPEN HEART (CUSTOM PROCEDURE TRAY) ×3 IMPLANT
PAD ARMBOARD 7.5X6 YLW CONV (MISCELLANEOUS) ×6 IMPLANT
PAD ELECT DEFIB RADIOL ZOLL (MISCELLANEOUS) ×3 IMPLANT
PENCIL BUTTON HOLSTER BLD 10FT (ELECTRODE) ×3 IMPLANT
POSITIONER HEAD DONUT 9IN (MISCELLANEOUS) ×3 IMPLANT
PUNCH AORTIC ROTATE  4.5MM 8IN (MISCELLANEOUS) ×3 IMPLANT
SET CARDIOPLEGIA MPS 5001102 (MISCELLANEOUS) ×3 IMPLANT
SPONGE LAP 18X18 RF (DISPOSABLE) ×3 IMPLANT
SPONGE LAP 18X18 X RAY DECT (DISPOSABLE) ×3 IMPLANT
SPONGE LAP 4X18 RFD (DISPOSABLE) ×3 IMPLANT
SURGIFLO W/THROMBIN 8M KIT (HEMOSTASIS) ×3 IMPLANT
SUT BONE WAX W31G (SUTURE) ×3 IMPLANT
SUT MNCRL AB 4-0 PS2 18 (SUTURE) ×3 IMPLANT
SUT PROLENE 3 0 SH1 36 (SUTURE) ×9 IMPLANT
SUT PROLENE 4 0 RB 1 (SUTURE) ×2
SUT PROLENE 4 0 TF (SUTURE) ×6 IMPLANT
SUT PROLENE 4-0 RB1 .5 CRCL 36 (SUTURE) ×4 IMPLANT
SUT PROLENE 6 0 C 1 30 (SUTURE) ×3 IMPLANT
SUT PROLENE 6 0 CC (SUTURE) ×6 IMPLANT
SUT PROLENE 7 0 BV1 MDA (SUTURE) ×3 IMPLANT
SUT SILK 2 0 SH CR/8 (SUTURE) ×3 IMPLANT
SUT STEEL 6MS V (SUTURE) ×3 IMPLANT
SUT STEEL SZ 6 DBL 3X14 BALL (SUTURE) ×3 IMPLANT
SUT VIC AB 1 CTX 18 (SUTURE) ×6 IMPLANT
SUT VIC AB 2-0 CT1 27 (SUTURE) ×1
SUT VIC AB 2-0 CT1 TAPERPNT 27 (SUTURE) ×2 IMPLANT
SYSTEM SAHARA CHEST DRAIN ATS (WOUND CARE) ×3 IMPLANT
TAPE CLOTH SURG 4X10 WHT LF (GAUZE/BANDAGES/DRESSINGS) ×3 IMPLANT
TAPE PAPER MEDFIX 1IN X 10YD (GAUZE/BANDAGES/DRESSINGS) ×3 IMPLANT
TOWEL GREEN STERILE (TOWEL DISPOSABLE) ×3 IMPLANT
TOWEL GREEN STERILE FF (TOWEL DISPOSABLE) ×3 IMPLANT
TRAY FOLEY SLVR 16FR TEMP STAT (SET/KITS/TRAYS/PACK) ×3 IMPLANT
TUBING LAP HI FLOW INSUFFLATIO (TUBING) ×3 IMPLANT
UNDERPAD 30X30 (UNDERPADS AND DIAPERS) ×3 IMPLANT
WATER STERILE IRR 1000ML POUR (IV SOLUTION) ×6 IMPLANT

## 2019-05-15 NOTE — Progress Notes (Signed)
      [address]       [place] [address]             [phone]     Pre Procedure note for inpatients:   Megan [name] has been scheduled for Procedure(s): CORONARY ARTERY BYPASS GRAFTING (CABG) (N/A) TRANSESOPHAGEAL ECHOCARDIOGRAM (TEE) (N/A) today. The various methods of treatment have been discussed with the patient. After consideration of the risks, benefits and treatment options the patient has consented to the planned procedure.   The patient has been seen and labs reviewed. There are no changes in the patient's condition to prevent proceeding with the planned procedure today.  Recent labs:  Lab Results  Component Value Date   WBC 7.5 05/15/2019   HGB 7.8 (L) 05/15/2019   HCT 25.0 (L) 05/15/2019   PLT 304 05/15/2019   GLUCOSE 134 (H) 05/15/2019   ALT 14 05/13/2019   AST 22 05/13/2019   NA 140 05/15/2019   K 3.9 05/15/2019   CL 107 05/15/2019   CREATININE 0.81 05/15/2019   BUN 35 (H) 05/15/2019   CO2 26 05/15/2019   TSH 3.265 09/09/2018   HGBA1C 6.3 (H) 05/13/2019    Grace Isaac, MD 05/15/2019 8:32 AM

## 2019-05-15 NOTE — Anesthesia Procedure Notes (Signed)
Procedure Name: Intubation Date/Time: 05/15/2019 8:47 AM Performed by: Wilburn Cornelia, CRNA Pre-anesthesia Checklist: Patient identified, Emergency Drugs available, Suction available, Patient being monitored and Timeout performed Patient Re-evaluated:Patient Re-evaluated prior to induction Oxygen Delivery Method: Circle system utilized Preoxygenation: Pre-oxygenation with 100% oxygen Induction Type: IV induction Ventilation: Oral airway inserted - appropriate to patient size Laryngoscope Size: Mac and 4 Grade View: Grade IV Tube type: Oral Tube size: 8.0 mm Number of attempts: 3 Airway Equipment and Method: Stylet Placement Confirmation: ETT inserted through vocal cords under direct vision,  positive ETCO2,  CO2 detector and breath sounds checked- equal and bilateral Secured at: 21 cm Tube secured with: Tape Dental Injury: Teeth and Oropharynx as per pre-operative assessment and Injury to lip  Difficulty Due To: Difficult Airway- due to anterior larynx Future Recommendations: Recommend- induction with short-acting agent, and alternative techniques readily available Comments: DLx1 by [place] with Sabra Heck 3 - grade 4 view, DLx1 by H. Truman Hayward CRNA with MAC 3 - grade 4 view - bougie used - esophageal intubation.  DLx1 by Dr. Smith Robert with MAC 4 - grade 4 view - ETT placement confirmed.  Small cut to upper lip.  Lubrication applied.

## 2019-05-15 NOTE — Anesthesia Procedure Notes (Signed)
Central Venous Catheter Insertion Performed by: Effie Berkshire, MD, anesthesiologist Start/End9/16/2020 8:00 AM, 05/15/2019 8:05 AM Patient location: Pre-op. Preanesthetic checklist: patient identified, IV checked, site marked, risks and benefits discussed, surgical consent, monitors and equipment checked, pre-op evaluation, timeout performed and anesthesia consent Hand hygiene performed  and maximum sterile barriers used  PA cath was placed.Swan type:thermodilution Procedure performed without using ultrasound guided technique. Attempts: 1 Patient tolerated the procedure well with no immediate complications.

## 2019-05-15 NOTE — Procedures (Signed)
Extubation Procedure Note  Patient Details:   Name: OVELL ADKINSON DOB: 1945/12/24 MRN: [id number]   Airway Documentation:  Airway 8 mm (Active)  Secured at (cm) 21 cm 05/15/19 1937  Measured From Lips 05/15/19 1937  Secured Location Right 05/15/19 1937  Secured By Pink Tape 05/15/19 1937  Cuff Pressure (cm H2O) 24 cm H2O 05/15/19 1937  Site Condition Dry 05/15/19 1937   Vent end date: 05/15/19 Vent end time: 2024   Evaluation  O2 sats: stable throughout Complications: No apparent complications Patient did tolerate procedure well. Bilateral Breath Sounds: Clear  NIF=-25, Vital Capacity=.700-.800 No upper airway stridor heard  Yes  Ulice Dash 05/15/2019, 8:38 PM

## 2019-05-15 NOTE — Anesthesia Procedure Notes (Signed)
Central Venous Catheter Insertion Performed by: Effie Berkshire, MD, anesthesiologist Start/End9/16/2020 7:50 AM, 05/15/2019 8:00 AM Patient location: Pre-op. Preanesthetic checklist: patient identified, IV checked, site marked, risks and benefits discussed, surgical consent, monitors and equipment checked, pre-op evaluation, timeout performed and anesthesia consent Position: Trendelenburg Lidocaine 1% used for infiltration and patient sedated Hand hygiene performed , maximum sterile barriers used  and Seldinger technique used Catheter size: 9 Fr Total catheter length 10. Central line was placed.MAC introducer Swan type:thermodilution PA Cath depth:50 Procedure performed using ultrasound guided technique. Ultrasound Notes:anatomy identified, needle tip was noted to be adjacent to the nerve/plexus identified, no ultrasound evidence of intravascular and/or intraneural injection and image(s) printed for medical record Attempts: 1 Following insertion, line sutured and dressing applied. Post procedure assessment: blood return through all ports, free fluid flow and no air  Patient tolerated the procedure well with no immediate complications.

## 2019-05-15 NOTE — Progress Notes (Signed)
      [address]       [place] [address]             [phone]      S/p CABG x 2  BP (!) 114/50   Pulse 80   Temp 97.7 F (36.5 C)   Resp 17   Ht 5\' 3"  (1.6 m)   Wt 82.5 kg   SpO2 100%   BMI 32.22 kg/m   Intake/Output Summary (Last 24 hours) at 05/15/2019 1749 Last data filed at 05/15/2019 1700 Gross per 24 hour  Intake 4298.22 ml  Output 2280 ml  Net 2018.22 ml   29/16 CI= 2.8  Hgb 11.3  Doing well early postop  Remo Lipps C. Roxan Hockey, MD [facility] [phone]

## 2019-05-15 NOTE — Transfer of Care (Signed)
Immediate Anesthesia Transfer of Care Note  Patient: Megan [name]  Procedure(s) Performed: CORONARY ARTERY BYPASS GRAFTING (CABG) times two, left internal mammary artery to left anterior descending artery and left greater saphenous vein to posterior descending artery, left sapheouns vein harvested endoscopically  (N/A Chest) TRANSESOPHAGEAL ECHOCARDIOGRAM (TEE) (N/A )  Patient Location: SICU  Anesthesia Type:General  Level of Consciousness: Patient remains intubated per anesthesia plan  Airway & Oxygen Therapy: Patient remains intubated per anesthesia plan and Patient placed on Ventilator (see vital sign flow sheet for setting)  Post-op Assessment: Report given to RN and Post -op Vital signs reviewed and stable  Post vital signs: Reviewed and stable  Last Vitals:  Vitals Value Taken Time  BP    Temp    Pulse    Resp    SpO2    BP 110/56, HR 90 paced, RR 12 - on vent, Sats 100%  Last Pain:  Vitals:   05/15/19 0400  TempSrc: Oral  PainSc: 0-No pain      Patients Stated Pain Goal: 0 ([id number])  Complications: No apparent anesthesia complications

## 2019-05-15 NOTE — OR Nursing (Signed)
45 min cal to 2H.

## 2019-05-15 NOTE — Progress Notes (Signed)
PT Cancellation Note  Patient Details Name: Megan [name] MRN: [id number] DOB: [birth year]   Cancelled Treatment:    Reason Eval/Treat Not Completed: Patient at procedure or test/unavailable(Pt undergoing TEE.  Will check back as able. )   Denice Paradise 05/15/2019, 11:19 AM  Doree Kuehne,PT Acute Rehabilitation Services Pager:  [phone]  Office:  [phone]

## 2019-05-15 NOTE — Progress Notes (Signed)
  Echocardiogram Echocardiogram Transesophageal has been performed.  Megan [name] 05/15/2019, 9:54 AM

## 2019-05-15 NOTE — Progress Notes (Addendum)
OT Cancellation Note  Patient Details Name: Megan [name] MRN: [id number] DOB: 07/11/1946   Cancelled Treatment:    Reason Eval/Treat Not Completed: Patient at procedure or test/ unavailable(CABG/TEE)  [name] 05/15/2019, 8:43 AM   [name] OTR/L Acute Rehabilitation Services Pager: [phone] Office: [phone]

## 2019-05-15 NOTE — Anesthesia Procedure Notes (Signed)
Arterial Line Insertion Start/End9/16/2020 7:45 AM, 05/15/2019 7:55 AM Performed by: Wilburn Cornelia, CRNA, CRNA  Preanesthetic checklist: patient identified, IV checked, site marked, risks and benefits discussed, surgical consent, monitors and equipment checked, pre-op evaluation, timeout performed and anesthesia consent Lidocaine 1% used for infiltration and patient sedated Left, radial was placed Catheter size: 20 G Hand hygiene performed  and maximum sterile barriers used  Allen's test indicative of satisfactory collateral circulation Attempts: 1 Procedure performed without using ultrasound guided technique. Following insertion, dressing applied and Biopatch. Post procedure assessment: normal  Patient tolerated the procedure well with no immediate complications. Additional procedure comments: Arterial line placed by Wineglass.

## 2019-05-15 NOTE — Brief Op Note (Signed)
05/13/2019 - 05/15/2019  12:12 PM  PATIENT:  Megan [name]  73 y.o. female  PRE-OPERATIVE DIAGNOSIS:  coronary artery disease  POST-OPERATIVE DIAGNOSIS:  coronary artery disease  PROCEDURES:    -CORONARY ARTERY BYPASS GRAFTING x 2  LIMA -> LAD  SVG-> PDA  -ENDOSCOPIC SAPHENOUS VEIN HARVEST LEFT THIGH  -TRANSESOPHAGEAL ECHOCARDIOGRAM     SURGEON: Grace Isaac, MD  PHYSICIAN ASSISTANT: Sinia Antosh  ANESTHESIA:   general  EBL:  Per anesthesia and perfusion records  BLOOD ADMINISTERED:none  DRAINS: Left pleural and mediastinal tubes   LOCAL MEDICATIONS USED:  NONE  SPECIMEN:  No Specimen  DISPOSITION OF SPECIMEN:  N/A  COUNTS:  YES  DICTATION: .Dragon Dictation  PLAN OF CARE: Admit to inpatient   PATIENT DISPOSITION:  ICU - intubated and hemodynamically stable.   Delay start of Pharmacological VTE agent (>24hrs) due to surgical blood loss or risk of bleeding: yes

## 2019-05-16 ENCOUNTER — Inpatient Hospital Stay (HOSPITAL_COMMUNITY): Payer: Medicare Other

## 2019-05-16 ENCOUNTER — Encounter (HOSPITAL_COMMUNITY): Payer: Self-pay | Admitting: Cardiothoracic Surgery

## 2019-05-16 DIAGNOSIS — Z951 Presence of aortocoronary bypass graft: Secondary | ICD-10-CM

## 2019-05-16 LAB — CBC
HCT: 27.4 % — ABNORMAL LOW (ref 36.0–46.0)
HCT: 25.7 % — ABNORMAL LOW (ref 36.0–46.0)
Hemoglobin: 9.1 g/dL — ABNORMAL LOW (ref 12.0–15.0)
Hemoglobin: 8.4 g/dL — ABNORMAL LOW (ref 12.0–15.0)
MCH: 29.5 pg (ref 26.0–34.0)
MCH: 29.7 pg (ref 26.0–34.0)
MCHC: 33.2 g/dL (ref 30.0–36.0)
MCHC: 32.7 g/dL (ref 30.0–36.0)
MCV: 89 fL (ref 80.0–100.0)
MCV: 90.8 fL (ref 80.0–100.0)
Platelets: 194 10*3/uL (ref 150–400)
Platelets: 176 10*3/uL (ref 150–400)
RBC: 3.08 MIL/uL — ABNORMAL LOW (ref 3.87–5.11)
RBC: 2.83 MIL/uL — ABNORMAL LOW (ref 3.87–5.11)
RDW: 15 % (ref 11.5–15.5)
RDW: 15.3 % (ref 11.5–15.5)
WBC: 14.8 10*3/uL — ABNORMAL HIGH (ref 4.0–10.5)
WBC: 13.7 10*3/uL — ABNORMAL HIGH (ref 4.0–10.5)
nRBC: 0 % (ref 0.0–0.2)
nRBC: 0 % (ref 0.0–0.2)

## 2019-05-16 LAB — BASIC METABOLIC PANEL
Anion gap: 7 (ref 5–15)
Anion gap: 9 (ref 5–15)
BUN: 28 mg/dL — ABNORMAL HIGH (ref 8–23)
BUN: 27 mg/dL — ABNORMAL HIGH (ref 8–23)
CO2: 22 mmol/L (ref 22–32)
CO2: 23 mmol/L (ref 22–32)
Calcium: 7.6 mg/dL — ABNORMAL LOW (ref 8.9–10.3)
Calcium: 8 mg/dL — ABNORMAL LOW (ref 8.9–10.3)
Chloride: 111 mmol/L (ref 98–111)
Chloride: 109 mmol/L (ref 98–111)
Creatinine, Ser: 0.72 mg/dL (ref 0.44–1.00)
Creatinine, Ser: 0.95 mg/dL (ref 0.44–1.00)
GFR calc Af Amer: >60 mL/min (ref >60)
GFR calc Af Amer: >60 mL/min (ref >60)
GFR calc non Af Amer: >60 mL/min (ref >60)
GFR calc non Af Amer: 59 mL/min — ABNORMAL LOW (ref >60)
Glucose, Bld: 140 mg/dL — ABNORMAL HIGH (ref 70–99)
Glucose, Bld: 137 mg/dL — ABNORMAL HIGH (ref 70–99)
Potassium: 3.8 mmol/L (ref 3.5–5.1)
Potassium: 3.9 mmol/L (ref 3.5–5.1)
Sodium: 140 mmol/L (ref 135–145)
Sodium: 141 mmol/L (ref 135–145)

## 2019-05-16 LAB — GLUCOSE, CAPILLARY
Glucose-Capillary: 109 mg/dL — ABNORMAL HIGH (ref 70–99)
Glucose-Capillary: 110 mg/dL — ABNORMAL HIGH (ref 70–99)
Glucose-Capillary: 111 mg/dL — ABNORMAL HIGH (ref 70–99)
Glucose-Capillary: 112 mg/dL — ABNORMAL HIGH (ref 70–99)
Glucose-Capillary: 113 mg/dL — ABNORMAL HIGH (ref 70–99)
Glucose-Capillary: 114 mg/dL — ABNORMAL HIGH (ref 70–99)
Glucose-Capillary: 114 mg/dL — ABNORMAL HIGH (ref 70–99)
Glucose-Capillary: 117 mg/dL — ABNORMAL HIGH (ref 70–99)
Glucose-Capillary: 118 mg/dL — ABNORMAL HIGH (ref 70–99)
Glucose-Capillary: 125 mg/dL — ABNORMAL HIGH (ref 70–99)
Glucose-Capillary: 130 mg/dL — ABNORMAL HIGH (ref 70–99)
Glucose-Capillary: 135 mg/dL — ABNORMAL HIGH (ref 70–99)
Glucose-Capillary: 137 mg/dL — ABNORMAL HIGH (ref 70–99)
Glucose-Capillary: 142 mg/dL — ABNORMAL HIGH (ref 70–99)
Glucose-Capillary: 143 mg/dL — ABNORMAL HIGH (ref 70–99)
Glucose-Capillary: 98 mg/dL (ref 70–99)
Glucose-Capillary: 99 mg/dL (ref 70–99)

## 2019-05-16 LAB — COOXEMETRY PANEL
Carboxyhemoglobin: 1.7 % — ABNORMAL HIGH (ref 0.5–1.5)
Methemoglobin: 1.2 % (ref 0.0–1.5)
O2 Saturation: 76.3 %
Total hemoglobin: 8.7 g/dL — ABNORMAL LOW (ref 12.0–16.0)

## 2019-05-16 LAB — MAGNESIUM
Magnesium: 2 mg/dL (ref 1.7–2.4)
Magnesium: 2 mg/dL (ref 1.7–2.4)

## 2019-05-16 MED ORDER — CLOPIDOGREL BISULFATE 75 MG PO TABS
75.0000 mg | ORAL_TABLET | Freq: Every day | ORAL | Status: DC
  Administered 2019-05-16 – 2019-05-29 (×14): 75 mg via ORAL
  Filled 2019-05-16 (×15): qty 1

## 2019-05-16 MED ORDER — MILRINONE LACTATE IN DEXTROSE 20-5 MG/100ML-% IV SOLN: 0.2500 ug/kg/min | INTRAVENOUS | Status: DC

## 2019-05-16 MED ORDER — ENOXAPARIN SODIUM 30 MG/0.3ML ~~LOC~~ SOLN
30.0000 mg | Freq: Every day | SUBCUTANEOUS | Status: DC
  Administered 2019-05-16 – 2019-05-28 (×13): 30 mg via SUBCUTANEOUS
  Filled 2019-05-16 (×13): qty 0.3

## 2019-05-16 MED ORDER — FUROSEMIDE 10 MG/ML IJ SOLN
40.0000 mg | Freq: Once | INTRAMUSCULAR | Status: AC
  Administered 2019-05-16: 40 mg via INTRAVENOUS
  Filled 2019-05-16: qty 4

## 2019-05-16 MED ORDER — INSULIN ASPART 100 UNIT/ML ~~LOC~~ SOLN
0.0000 [IU] | SUBCUTANEOUS | Status: DC
  Administered 2019-05-16 – 2019-05-18 (×7): 2 [IU] via SUBCUTANEOUS
  Administered 2019-05-19: 4 [IU] via SUBCUTANEOUS
  Administered 2019-05-19 – 2019-05-20 (×3): 2 [IU] via SUBCUTANEOUS

## 2019-05-16 MED FILL — Potassium Chloride Inj 2 mEq/ML: INTRAVENOUS | Qty: 40 | Status: AC

## 2019-05-16 MED FILL — Heparin Sodium (Porcine) Inj 1000 Unit/ML: INTRAMUSCULAR | Qty: 30 | Status: AC

## 2019-05-16 MED FILL — Magnesium Sulfate Inj 50%: INTRAMUSCULAR | Qty: 10 | Status: AC

## 2019-05-16 NOTE — Discharge Instructions (Signed)
  Discharge Instructions:  1. You may shower, please wash incisions daily with soap and water and keep dry.  If you wish to cover wounds with dressing you may do so but please keep clean and change daily.  No tub baths or swimming until incisions have completely healed.  If your incisions become red or develop any drainage please call our office at [phone]  2. No Driving until cleared by Dr. Gerhardt's office and you are no longer using narcotic pain medications  3. Monitor your weight daily.. Please use the same scale and weigh at same time... If you gain 5-10 lbs in 48 hours with associated lower extremity swelling, please contact our office at [phone]  4. Fever of 101.5 for at least 24 hours with no source, please contact our office at [phone]  5. Activity- up as tolerated, please walk at least 3 times per day.  Avoid strenuous activity, no lifting, pushing, or pulling with your arms over 8-10 lbs for a minimum of 6 weeks  6. If any questions or concerns arise, please do not hesitate to contact our office at [phone]    

## 2019-05-16 NOTE — Discharge Summary (Signed)
Physician Discharge Summary  Patient ID: Megan [name] MRN: [id number] DOB/AGE: 1946-02-25 73 y.o.  Admit date: 05/13/2019 Discharge date: 05/29/2019  Admission Diagnoses:  Acute non-STEMI Coronary artery disease Chronic diastoloi heart failure Type 2 diabetes mellitus GERD Obesity Diabetic neuropathy Chronic kidney disease, stage 3 Hypothyroidism Aspirin allergy   Discharge Diagnoses:   Acute non-STEMI S/P CABG x 2 Coronary artery disease Chronic diastoloi heart failure Type 2 diabetes mellitus GERD Obesity Diabetic neuropathy Chronic kidney disease, stage 3 Hypothyroidism Aspirin allergy Expected acute blood loss anemia Post operative volume excess Atrial fibrillation  Discharged Condition: stable  History of Present Illness:      Megan [name] is a 73 year old female patient with past medical history significant for type 2 diabetes mellitus with associated diabetic polyneuropathy, diastolic congestive heart failure, hypertension, GERD, obstructive sleep apnea, chronic kidney disease, gout, hypothyroidism, anxiety, and insomnia who presented to the [facility] emergency department with discomfort in her chest beginning at 11 PM last night.  She had had nausea and vomiting on multiple occasions earlier that day.  Her husband recently passed away within the last 28 to 18 months and she is been under a lot of stress at home and in her personal life.  She was seen earlier this year on Jan 07, 2019 through video chat by her cardiologist.  She had been placed in rehab for about a month after experiencing weakness in her legs.  She was eventually discharged and now lives at home with a home health aide to assist her.  She had a few bouts of diarrhea and some nausea yesterday morning and afternoon.  Then, around 11 PM she awoke with chest pressure/pain.  The pain continued but she was able to fall back to sleep.  She woke up again around 1 AM with continued symptoms.  She did  have some radiating pain down her right arm.  After the symptoms persisted for several hours she called her sister the following morning and subsequently called 911.    Once in the ED, she was given morphine with improvement in her chest pain.  Her EKG showed a new left bundle branch block when compared to the previous EKG from January 2020.  She was placed on IV heparin while in the ED. She had a TTE done in January 2020 which showed an estimated ejection fraction of 55 to 60%.  She did not have any notable valvular abnormalities at that time.  She underwent a cardiac catheterization this afternoon which showed critical ostial LAD stenosis, 65% RCA distal stenosis, and a an estimated left ventricular ejection fraction of 35- 45%.  She was started on Aggrastat IV per pharmacy since she has an aspirin allergy.  Cardiothoracic surgery was consulted for possible surgical revascularization.   Of note, she had bilateral cellulitis earlier this year which was treated with antibiotics.  She then fell a few months back and she broke her ankle and tore ligament in her knee.  She went to a rehab program and was recently released August.  She now has help at home getting in-home therapy.  She also needs help getting in the shower. She gets around using a walker.   Hospital Course:  [name] remained stable following left heart cath.  Coronary bypass grafting was recommended to her by Dr. Servando Snare and she decided to proceed with surgery.  She was prepared and taken to the OR on 05/15/19 where CABG x 2 was accomplished without complication.  Following the procedure, she separated  from cardiopulmonary bypass on milrinone, dopamine and phenylephrine. Intravenous amiodarone load was also initiated in the OR for atrial fibrillation. Following surgery, she was transferred to the CVICU where she remained hemodynamically stable.  She was wean from the ventilator and and was extubated at 8:30 pm on the day of surgery. The  vasopressor and inotropic support were weaned on the first post-operative day. She remained in SR on the amiodarone.  She was started on Plavix in the first post-op day due to a history of anaphylaxis associated with aspirin. She was mobilized and made slow progress with ambulation and ability to care for herself. She was transferred to the progressive care unit on 4E where her progress continued. She did not have any further atrial fibrillation so the amiodarone infusion was discontinued without conversion to oral amio. She was evaluated by the inpatient rehab staff and ultimately it was decided she would be best served by further PT and rehab at a SNF rather than CIR. After obtaining insurance approval and fulfilling the requirements for pre-admission COVID screening, she was ready for discharge. At the time of discharge, she was alert and oriented and was ambulating with a walker in the hall 3 times daily with minimal assistance being required. She was maintaining adequate O2 saturations on RA. Her CXR obtained on 05/28/19 showed improved aeration of the left base with resolving pleural effusion. Her electrolytes were within normal limits and renal function was stable. Hct was 25% but trending up. The incisions were healing with no signs of complication.    Consults: cardiology  Significant Diagnostic Studies:   LEFT HEART CATH AND CORONARY ANGIOGRAPHY  Conclusion    Mid LM lesion is 30% stenosed.  Ost LAD lesion is 95% stenosed.  Prox RCA to Mid RCA lesion is 45% stenosed.  Dist RCA lesion is 65% stenosed.  There is moderate left ventricular systolic dysfunction.  LV end diastolic pressure is moderately elevated.  The left ventricular ejection fraction is 35-45% by visual estimate.   1. Critical ostial LAD stenosis. Heavily calcified.  2. Moderate distal RCA disease 3. Moderate LV dysfunction with anteroapical wall motion abnormality 4. Moderately elevated LVEDP  Plan: LAD  stenosis is poorly suited for PCI with heavy calcification and ostial location. Recommend CT surgery consultation. Will hold Plavix. Since patient is ASA allergic will start Aggrastat IV per pharmacy. Resume IV heparin in 8 hours.    Surgeon Notes    05/16/2019 8:58 AM Operative Note signed by Grace Isaac, MD  Indications  Non-ST elevation (NSTEMI) myocardial infarction ([place]) [I21.4 (ICD-10-CM)]  Procedural Details  Technical Details Indication: 73 yo WF with NSTEMI and new LBBB.   Procedural Details: The right wrist was prepped, draped, and anesthetized with 1% lidocaine. Ultrasound was used to guide access. Image was obtained and stored in the record. Using the modified Seldinger technique, a 6 French slender sheath was introduced into the right radial artery. 3 mg of verapamil was administered through the sheath, weight-based unfractionated heparin was administered intravenously. Standard Judkins catheters were used for selective coronary angiography and left ventriculography. There was tortuosity of the innominate. An ERAD right catheter was used to engage the RCA.  Catheter exchanges were performed over an exchange length guidewire. There were no immediate procedural complications. A TR band was used for radial hemostasis at the completion of the procedure.  The patient was transferred to the post catheterization recovery area for further monitoring.  Contrast: 80 cc  Estimated blood loss <50 mL.  During this procedure medications were administered to achieve and maintain moderate conscious sedation while the patient's heart rate, blood pressure, and oxygen saturation were continuously monitored and I was present face-to-face 100% of this time.  Medications (Filter: Administrations occurring from 05/13/19 1346 to 05/13/19 1505) (important)  Continuous medications are totaled by the amount administered until 05/13/19 1505.  Medication Rate/Dose/Volume Action  Date Time   midazolam  (VERSED) injection (mg) 1 mg Given 05/13/19 1406   Total dose as of 05/13/19 1505        1 mg        fentaNYL (SUBLIMAZE) injection (mcg) 25 mcg Given 05/13/19 1406   Total dose as of 05/13/19 1505        25 mcg        lidocaine (PF) (XYLOCAINE) 1 % injection (mL) 2 mL Given 05/13/19 1412   Total dose as of 05/13/19 1505        2 mL        Radial Cocktail/Verapamil only (mL) 10 mL Given 05/13/19 1413   Total dose as of 05/13/19 1505        10 mL        heparin injection (Units) 4,000 Units Given 05/13/19 1413   Total dose as of 05/13/19 1505        4,000 Units        Heparin (Porcine) in NaCl 1000-0.9 UT/500ML-% SOLN (mL) 500 mL Given 05/13/19 1421   Total dose as of 05/13/19 1505 500 mL Given 1421   1,000 mL        iohexol (OMNIPAQUE) 350 MG/ML injection (mL) 80 mL Given 05/13/19 1432   Total dose as of 05/13/19 1505        80 mL        acetaminophen (TYLENOL) tablet 650 mg (mg) *Not included in total [facility] Hold 05/13/19 1346   Dosing weight:  81.6 kg        Total dose as of 05/13/19 1505        Cannot be calculated        heparin ADULT infusion 100 units/mL (25000 units/240mL sodium chloride 0.45%) (Units/hr)  Stopped 05/13/19 1359   Dosing weight:  81.6 kg        Total dose as of 05/13/19 1505        Cannot be calculated        insulin aspart (novoLOG) injection 0-15 Units (Units) *Not included in total [facility] Hold 05/13/19 1346   Dosing weight:  81.6 kg        Total dose as of 05/13/19 1505        Cannot be calculated        levothyroxine (SYNTHROID) tablet 50 mcg (mcg) *Not included in total [facility] Hold 05/13/19 1346   Dosing weight:  81.6 kg        Total dose as of 05/13/19 1505        Cannot be calculated        metoprolol tartrate (LOPRESSOR) tablet 12.5 mg (mg) *Not included in total [facility] Hold 05/13/19 1346   Dosing weight:  81.6 kg        Total dose as of 05/13/19 1505        Cannot be calculated        nitroGLYCERIN (NITROGLYN) 2 % ointment 0.5 inch (inch) *Not included in  total [facility] Hold 05/13/19 1346   Dosing weight:  81.6 kg        Total dose as of  05/13/19 1505        Cannot be calculated        nitroGLYCERIN (NITROSTAT) SL tablet 0.4 mg (mg) *Not included in total [facility] Hold 05/13/19 1346   Dosing weight:  81.6 kg        Total dose as of 05/13/19 1505        Cannot be calculated        ondansetron (ZOFRAN) injection 4 mg (mg) *Not included in total [facility] Hold 05/13/19 1346   Dosing weight:  81.6 kg        Total dose as of 05/13/19 1505        Cannot be calculated        pantoprazole (PROTONIX) EC tablet 40 mg (mg) *Not included in total [facility] Hold 05/13/19 1346   Total dose as of 05/13/19 1505        Cannot be calculated        sodium chloride flush (NS) 0.9 % injection 3 mL (mL) *Not included in total [facility] Hold 05/13/19 1346   Dosing weight:  81.6 kg        Total dose as of 05/13/19 1505        Cannot be calculated        torsemide (DEMADEX) tablet 10 mg (mg) *Not included in total [facility] Hold 05/13/19 1346   Total dose as of 05/13/19 1505        Cannot be calculated        Sedation Time  Sedation Time Physician-1: 26 minutes  Contrast  Medication Name Total Dose  iohexol (OMNIPAQUE) 350 MG/ML injection 80 mL    Radiation/Fluoro  Fluoro time: 7.3 (min) DAP: 19.1 (Gycm2) Cumulative Air Kerma: [id number] (mGy)  Complications  Complications documented before study signed (05/13/2019 [id number] PM)   No complications were associated with this study.  Documented by Martinique, Peter M, MD - 05/13/2019 2:44 PM    Coronary Findings  Diagnostic Dominance: Right Left Main  Vessel was injected. Vessel is normal in caliber. The vessel is severely calcified.  Mid LM lesion 30% stenosed  Mid LM lesion is 30% stenosed.  Left Anterior Descending  Vessel was injected. Vessel is large. The vessel is severely calcified.  Ost LAD lesion 95% stenosed  Ost LAD lesion is 95% stenosed. The lesion is eccentric.  Left Circumflex  Vessel was injected. Vessel is normal in  caliber. There is mild diffuse disease throughout the vessel.  Right Coronary Artery  Vessel was injected. Vessel is normal in caliber. The vessel is severely calcified.  Prox RCA to Mid RCA lesion 45% stenosed  Prox RCA to Mid RCA lesion is 45% stenosed. The lesion is segmental.  Dist RCA lesion 65% stenosed  Dist RCA lesion is 65% stenosed.  Intervention  No interventions have been documented. Wall Motion  Resting               Left Heart  Left Ventricle The left ventricular size is normal. There is moderate left ventricular systolic dysfunction. LV end diastolic pressure is moderately elevated. The left ventricular ejection fraction is 35-45% by visual estimate. There are LV function abnormalities due to segmental dysfunction.  Coronary Diagrams  Diagnostic Dominance: Right     ECHOCARDIOGRAM COMPLETE Order #: [id number] Accession #: [id number] Patient Info  Patient name: Megan [name]  MRN: [id number]  Age: 73 y.o.  Sex: female  Vitals  BP Height Weight BSA (Calculated - sq m)  106/47 5\' 3"  (1.6 m) 90.3 kg 1.9  sq meters  Study Result  Result status: Final result     ECHOCARDIOGRAM REPORT       Patient Name:   Megan [name] Date of Exam: 05/13/2019 Medical Rec #:  [id number]          Height:       63.0 in Accession #:    [id number]         Weight:       180.0 lb Date of Birth:  November 21, 1945          BSA:          1.85 m Patient Age:    62 years           BP:           108/54 mmHg Patient Gender: F                  HR:           88 bpm. Exam Location:  Inpatient    Procedure: Intracardiac Opacification Agent  Indications:    Abnormal ECG 794.31 / R94.31                 Chest Pain 786.50 / R07.9   History:        Patient has prior history of Echocardiogram examinations, most                 recent 09/09/2018. CHF Signs/Symptoms: Hypotension Risk Factors:                 Diabetes. Chronic kidney disease.   Sonographer:    Talmage Coin Referring Phys: [id number] LINDSAY B ROBERTS    Sonographer Comments: Technically difficult study due to poor echo windows. Image acquisition challenging due to patient body habitus. IMPRESSIONS    1. The left ventricle has severely reduced systolic function, with an ejection fraction of 25-30%. The cavity size was normal. There is mildly increased left ventricular wall thickness. Left ventricular diastolic Doppler parameters are consistent with  impaired relaxation. Mid to apical anteroseptal and inferoseptal akinesis. Apical anterior, apical lateral, and apical inferior akinesis. Akinesis of the true apex. No LV thrombus noted.  2. Normal RV size with mildly decreased systolic function.  3. There is mild mitral annular calcification present. No evidence of mitral valve stenosis. Trivial mitral regurgitation.  4. The aortic root is normal in size and structure.  5. The aortic valve is tricuspid. No stenosis of the aortic valve.  6. Left atrial size was mildly dilated.  7. The IVC was normal in size with PA systolic pressure 24 mmHg.  FINDINGS  Left Ventricle: The left ventricle has severely reduced systolic function, with an ejection fraction of 25-30%. The cavity size was normal. There is mildly increased left ventricular wall thickness. Left ventricular diastolic Doppler parameters are  consistent with impaired relaxation. Definity contrast agent was given IV to delineate the left ventricular endocardial borders.  Right Ventricle: The right ventricle has mildly reduced systolic function. The cavity was normal. There is no increase in right ventricular wall thickness.  Left Atrium: Left atrial size was mildly dilated.  Right Atrium: Right atrial size was normal in size. Right atrial pressure is estimated at 3 mmHg.  Interatrial Septum: No atrial level shunt detected by color flow Doppler.  Pericardium: There is no evidence of pericardial effusion.  Mitral Valve: The  mitral valve is normal in structure. There is mild mitral annular calcification present. Mitral valve regurgitation is trivial by  color flow Doppler. No evidence of mitral valve stenosis.  Tricuspid Valve: The tricuspid valve is normal in structure. Tricuspid valve regurgitation is trivial by color flow Doppler.  Aortic Valve: The aortic valve is tricuspid Aortic valve regurgitation was not visualized by color flow Doppler. There is No stenosis of the aortic valve, with a calculated valve area of 1.34 cm.  Pulmonic Valve: The pulmonic valve was normal in structure. Pulmonic valve regurgitation is not visualized by color flow Doppler.  Aorta: The aortic root is normal in size and structure. The aorta is normal unless otherwise noted.  Venous: The inferior vena cava is normal in size with greater than 50% respiratory variability.    +--------------+--------++  LEFT VENTRICLE            +----------------+---------++ +--------------+--------++  Diastology                    PLAX 2D                   +----------------+---------++ +--------------+--------++  LV e' lateral:   9.14 cm/s    LVIDd:         4.25 cm    +----------------+---------++ +--------------+--------++  LV E/e' lateral: 9.2          LVIDs:         2.99 cm    +----------------+---------++ +--------------+--------++  LV e' medial:    5.87 cm/s    LV PW:         1.05 cm    +----------------+---------++ +--------------+--------++  LV E/e' medial:  14.4         LV IVS:        1.11 cm    +----------------+---------++ +--------------+--------++  LVOT diam:     1.60 cm    +--------------+--------++  LV SV:         46 ml      +--------------+--------++  LV SV Index:   23.81      +--------------+--------++  LVOT Area:     2.01 cm   +--------------+--------++                            +--------------+--------++  +---------------+---------++  RIGHT VENTRICLE             +---------------+---------++  RV S prime:      9.57 cm/s   +---------------+---------++  TAPSE (M-mode): 1.5 cm      +---------------+---------++  RVSP:           24.3 mmHg   +---------------+---------++  +---------------+-------++-----------++  LEFT ATRIUM              Index         +---------------+-------++-----------++  LA diam:        5.10 cm  2.76 cm/m    +---------------+-------++-----------++  LA Vol (A2C):   54.1 ml  29.26 ml/m   +---------------+-------++-----------++  LA Vol (A4C):   87.6 ml  47.38 ml/m   +---------------+-------++-----------++  LA Biplane Vol: 73.4 ml  39.70 ml/m   +---------------+-------++-----------++ +------------+---------++-----------++  RIGHT ATRIUM            Index         +------------+---------++-----------++  RA Pressure: 3.00 mmHg                +------------+---------++-----------++  RA Area:     16.40 cm                +------------+---------++-----------++  RA  Volume:   38.80 ml   20.98 ml/m   +------------+---------++-----------++  +------------------+------------++  AORTIC VALVE                      +------------------+------------++  AV Area (Vmax):    1.35 cm       +------------------+------------++  AV Area (Vmean):   1.41 cm       +------------------+------------++  AV Area (VTI):     1.34 cm       +------------------+------------++  AV Vmax:           180.40 cm/s    +------------------+------------++  AV Vmean:          129.200 cm/s   +------------------+------------++  AV VTI:            0.325 m        +------------------+------------++  AV Peak Grad:      13.0 mmHg      +------------------+------------++  AV Mean Grad:      7.4 mmHg       +------------------+------------++  LVOT Vmax:         121.00 cm/s    +------------------+------------++  LVOT Vmean:        90.900 cm/s    +------------------+------------++  LVOT VTI:          0.217 m        +------------------+------------++  LVOT/AV VTI ratio: 0.67            +------------------+------------++   +-------------+-------++  AORTA                   +-------------+-------++  Ao Root diam: 3.10 cm   +-------------+-------++  +--------------+----------++  +---------------+-----------++  MITRAL VALVE                  TRICUSPID VALVE               +--------------+----------++  +---------------+-----------++  MV Area (PHT): 4.31 cm       TR Peak grad:   21.3 mmHg     +--------------+----------++  +---------------+-----------++  MV PHT:        51.04 msec     TR Vmax:        231.00 cm/s   +--------------+----------++  +---------------+-----------++  MV Decel Time: 176 msec       Estimated RAP:  3.00 mmHg     +--------------+----------++  +---------------+-----------++ +--------------+-----------++  RVSP:           24.3 mmHg      MV E velocity: 84.40 cm/s    +---------------+-----------++ +--------------+-----------++  MV A velocity: 115.00 cm/s   +--------------+-------+ +--------------+-----------++  SHUNTS                   MV E/A ratio:  0.73          +--------------+-------+ +--------------+-----------++  Systemic VTI:  0.22 m                                 +--------------+-------+                                Systemic Diam: 1.60 cm                                +--------------+-------+    Loralie Champagne MD  Electronically signed by Loralie Champagne MD Signature Date/Time: 05/13/2019/5:35:39 PM     Treatments:   OPERATIVE REPORT  DATE OF PROCEDURE:  05/15/2019  PREOPERATIVE DIAGNOSES:  Recent myocardial infarction with decreased left ventricular function 35% and severe 2-vessel coronary artery disease.  POSTOPERATIVE DIAGNOSES:  Recent myocardial infarction with decreased left ventricular function 35% and severe 2-vessel coronary artery disease with previous history of pericarditis.  PROCEDURE PERFORMED:  Coronary artery bypass grafting x2 with the left internal mammary to the left anterior descending coronary artery and  reverse saphenous vein graft to the distal right coronary artery with left greater saphenous thigh endoscopic vein  harvesting.  SURGEON:  Lanelle Bal, MD  FIRST ASSISTANT:  Enid Cutter, PA-C  BRIEF HISTORY:  The patient is a 73 year old diabetic female with multiple chronic medical problems who presented on the 14th with acute myocardial infarction after an episode of prolonged chest pain at home.  She called 911 and was transported to [facility], seen and evaluated by cardiology.  Echocardiogram showed 30% to 35% ejection fraction.  Cardiac catheterization was done by Dr. Martinique which showed a 70% right coronary artery lesion in the proximal third and additional more distal 60% lesion.   She also had a greater than 90% ostial LAD lesion with a reasonable distal target.  Overall, ventricular function showed anterior apical hypokinesis, but appeared with slightly better ejection fraction than the echocardiogram indicated.  Because of her  symptoms and critical LAD disease with a large LAD, coronary artery bypass grafting was recommended to the patient, who agreed and signed informed consent.  Discharge Exam: Blood pressure (!) 98/53, pulse 88, temperature 98.8 F (37.1 C), temperature source Oral, resp. rate 18, height 5\' 3"  (1.6 m), weight 89.6 kg, SpO2 93 %.  General appearance: alert, cooperative and no distress Neurologic: intact Heart: regular rate and rhythm Lungs: CXR reviewed. Improving aeration left base. Decrease in effusion.  Extremities: Moderate LE edema.   Disposition: Discharge disposition: 03-Skilled Nursing Facility       Discharge Instructions    Amb Referral to Cardiac Rehabilitation   Complete by: As directed    Diagnosis:  CABG NSTEMI     CABG X ___: 2   After initial evaluation and assessments completed: Virtual Based Care may be provided alone or in conjunction with Phase 2 Cardiac Rehab based on patient barriers.: Yes     Allergies as  of 05/29/2019      Reactions   Aspirin Anaphylaxis   Bee Pollen    Extreme swelling due to bee stings   Codeine Nausea And Vomiting   Fish Oil Diarrhea   Sulfa Antibiotics Nausea And Vomiting   Iron Other (See Comments)   Severe Headache   Camphor Dermatitis   Camphor Dermatitis   Lisinopril Cough   Penicillins Rash   DID THE REACTION INVOLVE: Swelling of the face/tongue/throat, SOB, or low BP? Yes Sudden or severe rash/hives, skin peeling, or the inside of the mouth or nose? Unknown Did it require medical treatment? Unknown When did it last happen? If all above answers are NO, may proceed with cephalosporin use.   Sulfasalazine Nausea And Vomiting   Vicodin [hydrocodone-acetaminophen] Nausea And Vomiting      Medication List    STOP taking these medications   bisacodyl 10 MG suppository Commonly known as: DULCOLAX   LORazepam 0.5 MG tablet Commonly known as: ATIVAN   multivitamin with minerals Tabs tablet   torsemide 20 MG tablet Commonly known as: DEMADEX  TAKE these medications   allopurinol 100 MG tablet Commonly known as: ZYLOPRIM Take 1 tablet (100 mg total) by mouth daily.   atorvastatin 80 MG tablet Commonly known as: LIPITOR Take 1 tablet (80 mg total) by mouth daily at 6 PM.   clopidogrel 75 MG tablet Commonly known as: PLAVIX Take 1 tablet (75 mg total) by mouth daily.   diclofenac sodium 1 % Gel Commonly known as: VOLTAREN Apply 2 gm topically to left knee What changed:   how much to take  how to take this  when to take this  reasons to take this   Ensure Take 237 mLs by mouth as needed.   furosemide 40 MG tablet Commonly known as: LASIX Take 1 tablet (40 mg total) by mouth daily.   levothyroxine 50 MCG tablet Commonly known as: SYNTHROID Take 1 tablet (50 mcg total) by mouth daily before breakfast.   Lidocaine 4 % Ptch Commonly known as: Aspercreme Lidocaine Place 1 patch onto the skin daily. APPLY TOPICALLY TO LOWER  BACK ONCE DAILY FOR PAIN What changed:   when to take this  reasons to take this   Melatonin 3 MG Tabs Take 1 tablet (3 mg total) by mouth at bedtime. FOR INSOMNIA   metFORMIN 500 MG tablet Commonly known as: GLUCOPHAGE Take 1 tablet (500 mg total) by mouth 2 (two) times daily with a meal.   metoprolol tartrate 25 MG tablet Commonly known as: LOPRESSOR Take 0.5 tablets (12.5 mg total) by mouth 2 (two) times daily. What changed: additional instructions   multivitamins with iron Tabs tablet Take 1 tablet by mouth daily.   ondansetron 4 MG tablet Commonly known as: ZOFRAN Take 1 tablet (4 mg total) by mouth every 6 (six) hours as needed for nausea or vomiting. GIVE 1 TABLET BY MOUTH EVERY 6 HOURS AS NEEDED FOR NAUSEA/VOMITING What changed: additional instructions   OneTouch Verio test strip Generic drug: glucose blood USE TO TEST BLOOD GLUCOSE ONCE DAILY   pantoprazole 40 MG tablet Commonly known as: PROTONIX Take 1 tablet (40 mg total) by mouth daily.   polyvinyl alcohol 1.4 % ophthalmic solution Commonly known as: LIQUIFILM TEARS Place 1 drop into both eyes every 4 (four) hours as needed for dry eyes.   potassium chloride SA 20 MEQ tablet Commonly known as: KLOR-CON Take 1 tablet (20 mEq total) by mouth daily. While taking Lasix (furosemide). What changed:   when to take this  additional instructions   traMADol 50 MG tablet Commonly known as: ULTRAM Take 1 tablet (50 mg total) by mouth every 4 (four) hours as needed for up to 5 days for moderate pain.   TYLENOL ARTHRITIS PAIN PO Take 650 mg by mouth every 8 (eight) hours.   vitamin B-12 1000 MCG tablet Commonly known as: CYANOCOBALAMIN 1 tab by orally daily What changed:   how much to take  how to take this  when to take this  additional instructions      Follow-up Information    Grace Isaac, MD. Go on 06/13/2019.   Specialty: Cardiothoracic Surgery Why: You have an appointment with Dr.  Servando Snare on Thursday 06/13/19 at 11am.  Please arrive 30 minutes early for a chest x-ray to be performed by [facility] located on the first floor of the same building.  Contact information: [place] [address] [phone]        Daune Perch, NP Follow up on 06/06/2019.   Specialty: Cardiology Why: at  10:45am for your follow up appt with Dr. Elmarie Shiley NP Contact information: [place] [address] [phone]        Brunetta Genera, MD. Go on 06/24/2019.   Specialties: Hematology, Oncology Why: You have an appointment with Dr. Irene Limbo on Monday, 06/24/19 at 12:00. Contact information: [place] [address] [phone]        Nahser, Wonda Cheng, MD. Go on 07/03/2019.   Specialty: Cardiology Why: You have a cardiology follow up appointment with Dr. Acie Fredrickson on Wednseday, 07/03/19 at 9:40am.  Contact information: [address] [place] [address] [phone]          The patient has been discharged on:   1.Beta Blocker:  Yes [ x  ]                              No   [   ]                              If No, reason:  2.Ace Inhibitor/ARB: Yes [   ]                                     No  [  x  ]                                     If No, reason:  Allergy to ACE-I  3.Statin:   Yes [ x  ]                  No  [   ]                  If No, reason:  4.Ecasa:  Yes  [   ]                  No   [  x ]                  If No, reason:Severe aspirin allergy    Signed: John Giovanni, PA-C 05/29/2019, 11:24 AM

## 2019-05-16 NOTE — Progress Notes (Addendum)
[facility] DAILY ICU PROGRESS NOTE                   [address]            [place], [address]          [phone]   1 Day Post-Op Procedure(s) (LRB): CORONARY ARTERY BYPASS GRAFTING (CABG) times two, left internal mammary artery to left anterior descending artery and left greater saphenous vein to posterior descending artery, left sapheouns vein harvested endoscopically  (N/A) TRANSESOPHAGEAL ECHOCARDIOGRAM (TEE) (N/A)  Total Length of Stay:  LOS: 3 days   Subjective: Extubated around 8:30 last evening.  Awake and alert with appropriate mental status.  No major events since surgery.   Objective: Vital signs in last 24 hours: Temp:  [97.5 F (36.4 C)-99 F (37.2 C)] 98.6 F (37 C) (09/17 0700) Pulse Rate:  [80-95] 94 (09/17 0700) Cardiac Rhythm: Atrial paced (09/16 2000) Resp:  [6-27] 16 (09/17 0700) BP: (92-120)/(39-82) 107/51 (09/17 0700) SpO2:  [88 %-100 %] 98 % (09/17 0700) Arterial Line BP: (89-149)/(40-82) 141/43 (09/17 0700) FiO2 (%):  [40 %-50 %] 40 % (09/16 1937) Weight:  [90.3 kg] 90.3 kg (09/17 0500)  Filed Weights   05/13/19 0749 05/14/19 0500 05/16/19 0500  Weight: 81.6 kg 82.5 kg 90.3 kg    Weight change:    Hemodynamic parameters for last 24 hours: PAP: (21-38)/(10-22) 29/13 CO:  [3.9 L/min-7.8 L/min] 7.8 L/min CI:  [2.1 L/min/m2-4.2 L/min/m2] 4.2 L/min/m2  Intake/Output from previous day: 09/16 0701 - 09/17 0700 In: 5283.6 [I.V.:4568.4; Blood:315; IV Piggyback:400.2] Out: 2660 [Urine:1520; Blood:850; Chest Tube:290]  Intake/Output this shift: No intake/output data recorded.  Current Meds: Scheduled Meds: . acetaminophen  1,000 mg Oral Q6H   Or  . acetaminophen (TYLENOL) oral liquid 160 mg/5 mL  1,000 mg Per Tube Q6H  . atorvastatin  80 mg Oral q1800  . bisacodyl  10 mg Oral Daily   Or  . bisacodyl  10 mg Rectal Daily  . Chlorhexidine Gluconate Cloth  6 each Topical Daily  . docusate sodium  200 mg Oral Daily  . insulin  regular  0-10 Units Intravenous TID WC  . levothyroxine  50 mcg Oral Q0600  . metoprolol tartrate  12.5 mg Oral BID   Or  . metoprolol tartrate  12.5 mg Per Tube BID  . [START ON 05/17/2019] pantoprazole  40 mg Oral Daily  . sodium chloride flush  10-40 mL Intracatheter Q12H  . sodium chloride flush  3 mL Intravenous Q12H   Continuous Infusions: . sodium chloride 10 mL/hr at 05/16/19 0600  . sodium chloride    . sodium chloride 20 mL/hr at 05/15/19 1700  . albumin human 12.5 g (05/15/19 1700)  . amiodarone 30 mg/hr (05/16/19 0600)  . dexmedetomidine (PRECEDEX) IV infusion Stopped (05/15/19 1701)  . DOPamine 3 mcg/kg/min (05/16/19 0600)  . insulin 2.6 mL/hr at 05/16/19 0600  . lactated ringers    . lactated ringers    . lactated ringers 20 mL/hr at 05/16/19 0600  . levofloxacin (LEVAQUIN) IV    . milrinone 0.3 mcg/kg/min (05/16/19 0600)  . nitroGLYCERIN Stopped (05/15/19 1428)  . phenylephrine (NEO-SYNEPHRINE) Adult infusion 80 mcg/min (05/16/19 0600)   PRN Meds:.sodium chloride, acetaminophen, albumin human, lactated ringers, metoprolol tartrate, morphine injection, ondansetron (ZOFRAN) IV, oxyCODONE, sodium chloride flush, sodium chloride flush, traMADol  General appearance: alert, cooperative and mild distress Neurologic: intact Heart: regular rate and rhythm Lungs: clear to auscultation bilaterally Abdomen: Soft and  non-tender, rare bowel sounds. Extremities: All well perfused. SCD's applied to LE's. Wound: The sternal incision is coverd with a negative pressure dressing that is functioning appropriately.  Lab Results: CBC: Recent Labs    05/15/19 2033 05/15/19 2129 05/16/19 0400  WBC 18.0*  --  14.8*  HGB 9.6* 8.2* 9.1*  HCT 28.7* 24.0* 27.4*  PLT 190  --  194   BMET:  Recent Labs    05/15/19 2033 05/15/19 2129 05/16/19 0400  NA 140 146* 140  K 3.9 3.1* 3.8  CL 114*  --  111  CO2 21*  --  22  GLUCOSE 118*  --  140*  BUN 34*  --  28*  CREATININE 0.70  --   0.72  CALCIUM 7.7*  --  7.6*    CMET: Lab Results  Component Value Date   WBC 14.8 (H) 05/16/2019   HGB 9.1 (L) 05/16/2019   HCT 27.4 (L) 05/16/2019   PLT 194 05/16/2019   GLUCOSE 140 (H) 05/16/2019   ALT 14 05/13/2019   AST 22 05/13/2019   NA 140 05/16/2019   K 3.8 05/16/2019   CL 111 05/16/2019   CREATININE 0.72 05/16/2019   BUN 28 (H) 05/16/2019   CO2 22 05/16/2019   TSH 3.265 09/09/2018   INR 1.5 (H) 05/15/2019   HGBA1C 6.3 (H) 05/13/2019      PT/INR:  Recent Labs    05/15/19 1438  LABPROT 17.5*  INR 1.5*   Radiology: Dg Chest Port 1 View  Result Date: 05/15/2019 CLINICAL DATA:  Post CABG x 2 done today. Chest tube at R of bedside. Pt intubated. EXAM: PORTABLE CHEST 1 VIEW COMPARISON:  05/13/2019 FINDINGS: Endotracheal tube is in place, tip approximately 3 centimeters above the carina. A nasogastric tube is in place, side port at the level of the mid esophagus. The distal tip is at the LOWER esophagus. A LEFT-sided chest tube is in place. RIGHT-sided Swan-Ganz catheter tip overlies the level of the pulmonary outflow tract. Status post median sternotomy and CABG. The heart is mildly enlarged but stable in configuration. There is minimal bibasilar atelectasis. RIGHT pleural thickening appears stable. Focal perihilar opacity in the RIGHT lung could represent atelectasis or infiltrate and warrants follow-up. No pulmonary edema. No pneumothorax. IMPRESSION: 1. Status post median sternotomy and CABG. Lines and tubes as described. 2. Nasogastric tube tip overlies/the level of the LOWER esophagus and can be advanced another 10 centimeters into the proximal stomach. 3. RIGHT perihilar atelectasis or infiltrate. 4. No pneumothorax. 5. Minimal bibasilar atelectasis. Electronically Signed   By: Nolon Nations M.D.   On: 05/15/2019 16:08     Assessment/Plan: S/P Procedure(s) (LRB): CORONARY ARTERY BYPASS GRAFTING (CABG) times two, left internal mammary artery to left anterior  descending artery and left greater saphenous vein to posterior descending artery, left sapheouns vein harvested endoscopically  (N/A) TRANSESOPHAGEAL ECHOCARDIOGRAM (TEE) (N/A)  -POD1 CABG x 2 for CAD presenting with an acute NSTEMI and pre-op EF 30-35%. VS and hemodynamics stable since surgery with current CI  >4. Will slowly wean vasopressor and inotropic  Support.  D/C PA cath and arterial line later today and mobilize.   -Atrial fibrillation-- IV amiodarone load initiated in the OR yesterday. Holding SR on amiodarone at 0.5mg /min. Will continue infusion and convert to PO after she is tolerating advanced diet.   -Volume excess- Wt. Apporx. 8.5kg > pre-op.  Give one dose of IV lasix today.   -Chronic kidney disease, stage 3- Stable renal function, K+ OK.  Monitor.   -Type 2 diabetes mellitus-Pre-op A1C 6.3.  Glucose well controlled with an insulin drip. Convert to [place] today.   -Expected acute blood loss anemia--Hct acceptable and no indication for transfusion. Minimal chest tube output.  Monitor.  -GERD- Continue PPI.   -DVT PPX- continue SCD's for now and add Lovenox this evening.    Megan Odea, PA-C [phone] 05/16/2019 7:34 AM  Stable postop  I have seen and examined Megan [name] and agree with the above assessment  and plan.  Megan Isaac MD [place] [phone] Office [phone] 05/16/2019 8:12 AM

## 2019-05-16 NOTE — Progress Notes (Signed)
   Doing well post surgery.  No A. Fib.\  Received SVG to the distal RCA and LIMA to LAD.  We will follow.

## 2019-05-16 NOTE — Progress Notes (Signed)
Patient ID: Megan [name], female   DOB: [birth year], 73 y.o.   MRN: [id number] [facility] Evening Rounds:  Hemodynamically stable in sinus rhythm. Dopamine 2.5, milrinone 0.125, neo 70 mcg, amio 30/hr Co-ox 76% this afternoon.  Urine output ok sats 97% 1L [place]

## 2019-05-16 NOTE — Op Note (Signed)
NAME: Megan [name], Megan [name] MEDICAL RECORD [id number] ACCOUNT [id number] DATE OF BIRTH:1946/02/23 FACILITY: [facility] LOCATION: [facility] PHYSICIAN:Pallas Wahlert BServando Snare, MD  OPERATIVE REPORT  DATE OF PROCEDURE:  05/15/2019  PREOPERATIVE DIAGNOSES:  Recent myocardial infarction with decreased left ventricular function 35% and severe 2-vessel coronary artery disease.  POSTOPERATIVE DIAGNOSES:  Recent myocardial infarction with decreased left ventricular function 35% and severe 2-vessel coronary artery disease with previous history of pericarditis.  PROCEDURE PERFORMED:  Coronary artery bypass grafting x2 with the left internal mammary to the left anterior descending coronary artery and reverse saphenous vein graft to the distal right coronary artery with left greater saphenous thigh endoscopic vein  harvesting.  SURGEON:  Lanelle Bal, MD  FIRST ASSISTANT:  Enid Cutter, PA-C  BRIEF HISTORY:  The patient is a 73 year old diabetic female with multiple chronic medical problems who presented on the 14th with acute myocardial infarction after an episode of prolonged chest pain at home.  She called 911 and was transported to [facility], seen and evaluated by cardiology.  Echocardiogram showed 30% to 35% ejection fraction.  Cardiac catheterization was done by Dr. Martinique which showed a 70% right coronary artery lesion in the proximal third and additional more distal 60% lesion.   She also had a greater than 90% ostial LAD lesion with a reasonable distal target.  Overall, ventricular function showed anterior apical hypokinesis, but appeared with slightly better ejection fraction than the echocardiogram indicated.  Because of her  symptoms and critical LAD disease with a large LAD, coronary artery bypass grafting was recommended to the patient, who agreed and signed informed consent.  DESCRIPTION OF PROCEDURE:  The patient underwent general endotracheal anesthesia by Dr. Smith Robert.  She has had  Swan-Ganz and arterial line monitors placed.  A TEE probe was placed and showed some calcification of the aortic valve but without stenosis.   Overall, ejection fraction appeared slightly better than the preoperative echo.  It showed mild mitral regurgitation.  The skin of the chest and legs were prepped with Betadine and draped in a sterile manner.  Appropriate timeout was performed, and then  we proceeded with endoscopic vein harvesting of the left greater saphenous vein from the thigh.  This vein was of excellent quality and caliber.  Median sternotomy was performed.  The left internal mammary artery was dissected down as a pedicle graft.   The distal artery was divided and had good free flow.  The vessel was hydrostatically dilated with heparinized saline.  We then proceeded to open the pericardium.  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  The ascending aorta, right atrium, and anterior wall were dissected out.  The patient was then systemically heparinized.  The ascending  aorta was cannulated.  The right atrium was cannulated with a dual-stage venous cannula.  The patient was then placed on cardiopulmonary bypass at 2.4 L/min per sq m.  Sites of anastomosis were selected and dissected at the epicardium.  The patient's  body temperature was then cooled to 32 degrees.  Aortic cross-clamp was applied, and 500 mL of cold blood potassium cardioplegia was administered with diastolic arrest of the heart.  Myocardial septal temperatures were monitored throughout the  cross-clamp.  It maintained in the 10-degree range.  We turned our attention first to the very distal right coronary artery.  This vessel was somewhat thickened, relatively small.  It was  opened and admitted a 1.5 mm probe distally.  Using a running 7-0  Prolene, distal anastomosis was  performed with a segment of reverse saphenous vein graft.  Attention was then turned to the left anterior descending coronary artery.  The proximal two-thirds of this vessel were intramyocardial.  The distal third was  opened. It was a reasonable sized vessel and admitted 1.5 mm probe proximally and distally.  Using a running 8-0 Prolene, the left internal mammary artery was anastomosed to the left anterior descending coronary artery.  With the cross-clamp still in  place, a single-punch aortotomy was performed, and the vein graft to the right coronary artery was then opened and with the proximal anastomosis completed, the bulldog on the mammary artery was removed with prompt rise in myocardial septal temperature.   The heart was allowed to passively fill and deair.  The proximal anastomosis was completed.  The patient spontaneously converted to a sinus rhythm.  She had been started on amiodarone drip because of atrial fibrillation during initial cannulation.  She  was started on low-dose dopamine and milrinone.  Body temperature was rewarmed to 37 degrees.  Atrial and ventricular pacing wires were applied. The patient was then ventilated and weaned from cardiopulmonary bypass without difficulty.  She remained  hemodynamically stable.  She was decannulated in the usual fashion.  Protamine sulfate was administered with operative field hemostatic.  A left pleural tube and a Blake mediastinal drain were left in place.  The pericardium was loosely reapproximated.   The sternum was closed with #6 stainless steel wire, fascia closed with interrupted 0 Vicryl, running 3-0 Vicryl in the subcutaneous tissue, 3-0 subcuticular stitch in the skin edges.  A Prevena incisional wound VAC was placed on the incision because of  her poor tissues and diabetic status.  She did require 2 units of packed red blood cells in the pump prior because of low hematocrit starting the case, and 1 additional unit of blood was given after  coming off bypass when her hematocrit was 23%.  After  cell savers were given, sponge and needle count was reported as correct at completion of procedure.  The patient tolerated the procedure well and was transferred to the surgical intensive care unit for further postoperative care.  Sponge and needle count  was reported as correct.  The patient's RF scanning reported clear code.  LN/NUANCE  D:05/15/2019 T:05/16/2019 JOB:[id number]

## 2019-05-16 NOTE — Anesthesia Postprocedure Evaluation (Signed)
Anesthesia Post Note  Patient: Megan [name]  Procedure(s) Performed: CORONARY ARTERY BYPASS GRAFTING (CABG) times two, left internal mammary artery to left anterior descending artery and left greater saphenous vein to posterior descending artery, left sapheouns vein harvested endoscopically  (N/A Chest) TRANSESOPHAGEAL ECHOCARDIOGRAM (TEE) (N/A )     Patient location during evaluation: SICU Anesthesia Type: General Level of consciousness: awake and alert Pain management: pain level controlled Vital Signs Assessment: post-procedure vital signs reviewed and stable Respiratory status: spontaneous breathing Cardiovascular status: stable Postop Assessment: no apparent nausea or vomiting Anesthetic complications: no Comments: Extubated overnight. Pain is mild. No acute complaints.     Last Vitals:  Vitals:   05/16/19 0600 05/16/19 0700  BP: (!) 102/50 (!) 107/51  Pulse: 94 94  Resp: 20 16  Temp: 37 C 37 C  SpO2: 93% 98%    Last Pain:  Vitals:   05/16/19 0541  TempSrc:   PainSc: [name]

## 2019-05-17 ENCOUNTER — Inpatient Hospital Stay (HOSPITAL_COMMUNITY): Payer: Medicare Other

## 2019-05-17 DIAGNOSIS — I480 Paroxysmal atrial fibrillation: Secondary | ICD-10-CM

## 2019-05-17 LAB — GLUCOSE, CAPILLARY
Glucose-Capillary: 113 mg/dL — ABNORMAL HIGH (ref 70–99)
Glucose-Capillary: 116 mg/dL — ABNORMAL HIGH (ref 70–99)
Glucose-Capillary: 116 mg/dL — ABNORMAL HIGH (ref 70–99)
Glucose-Capillary: 121 mg/dL — ABNORMAL HIGH (ref 70–99)
Glucose-Capillary: 123 mg/dL — ABNORMAL HIGH (ref 70–99)
Glucose-Capillary: 129 mg/dL — ABNORMAL HIGH (ref 70–99)
Glucose-Capillary: 141 mg/dL — ABNORMAL HIGH (ref 70–99)

## 2019-05-17 LAB — CBC
HCT: 24.1 % — ABNORMAL LOW (ref 36.0–46.0)
Hemoglobin: 7.5 g/dL — ABNORMAL LOW (ref 12.0–15.0)
MCH: 29 pg (ref 26.0–34.0)
MCHC: 31.1 g/dL (ref 30.0–36.0)
MCV: 93.1 fL (ref 80.0–100.0)
Platelets: 134 10*3/uL — ABNORMAL LOW (ref 150–400)
RBC: 2.59 MIL/uL — ABNORMAL LOW (ref 3.87–5.11)
RDW: 15.7 % — ABNORMAL HIGH (ref 11.5–15.5)
WBC: 8.7 10*3/uL (ref 4.0–10.5)
nRBC: 0 % (ref 0.0–0.2)

## 2019-05-17 LAB — COOXEMETRY PANEL
Carboxyhemoglobin: 1.1 % (ref 0.5–1.5)
Methemoglobin: 0.8 % (ref 0.0–1.5)
O2 Saturation: 68.5 %
Total hemoglobin: 6.9 g/dL — CL (ref 12.0–16.0)

## 2019-05-17 LAB — BASIC METABOLIC PANEL
Anion gap: 8 (ref 5–15)
BUN: 28 mg/dL — ABNORMAL HIGH (ref 8–23)
CO2: 23 mmol/L (ref 22–32)
Calcium: 8.1 mg/dL — ABNORMAL LOW (ref 8.9–10.3)
Chloride: 109 mmol/L (ref 98–111)
Creatinine, Ser: 0.94 mg/dL (ref 0.44–1.00)
GFR calc Af Amer: >60 mL/min (ref >60)
GFR calc non Af Amer: >60 mL/min (ref >60)
Glucose, Bld: 138 mg/dL — ABNORMAL HIGH (ref 70–99)
Potassium: 3.9 mmol/L (ref 3.5–5.1)
Sodium: 140 mmol/L (ref 135–145)

## 2019-05-17 MED ORDER — FUROSEMIDE 10 MG/ML IJ SOLN
40.0000 mg | Freq: Two times a day (BID) | INTRAMUSCULAR | Status: DC
  Administered 2019-05-17 – 2019-05-22 (×12): 40 mg via INTRAVENOUS
  Filled 2019-05-17 (×13): qty 4

## 2019-05-17 MED ORDER — POTASSIUM CHLORIDE CRYS ER 20 MEQ PO TBCR
20.0000 meq | EXTENDED_RELEASE_TABLET | Freq: Two times a day (BID) | ORAL | Status: DC
  Administered 2019-05-17 – 2019-05-29 (×25): 20 meq via ORAL
  Filled 2019-05-17 (×25): qty 1

## 2019-05-17 MED FILL — Electrolyte-R (PH 7.4) Solution: INTRAVENOUS | Qty: 3000 | Status: AC

## 2019-05-17 MED FILL — Sodium Chloride IV Soln 0.9%: INTRAVENOUS | Qty: 2000 | Status: AC

## 2019-05-17 MED FILL — Sodium Bicarbonate IV Soln 8.4%: INTRAVENOUS | Qty: 50 | Status: AC

## 2019-05-17 MED FILL — Heparin Sodium (Porcine) Inj 1000 Unit/ML: INTRAMUSCULAR | Qty: 10 | Status: AC

## 2019-05-17 MED FILL — Mannitol IV Soln 20%: INTRAVENOUS | Qty: 500 | Status: AC

## 2019-05-17 MED FILL — Lidocaine HCl Local Soln Prefilled Syringe 100 MG/5ML (2%): INTRAMUSCULAR | Qty: 5 | Status: AC

## 2019-05-17 NOTE — Progress Notes (Addendum)
[facility] DAILY ICU PROGRESS NOTE                   [address]            [place] [address]          [phone]   2 Days Post-Op Procedure(s) (LRB): CORONARY ARTERY BYPASS GRAFTING (CABG) times two, left internal mammary artery to left anterior descending artery and left greater saphenous vein to posterior descending artery, left sapheouns vein harvested endoscopically  (N/A) TRANSESOPHAGEAL ECHOCARDIOGRAM (TEE) (N/A)  Total Length of Stay:  LOS: 4 days   Subjective: Awake and alert, out of bed to chair for a few hours yesterday.  Had some nausea earlier but none now.  Milrinone and dopamine off. Amiodarone and low dose phenylephrine infusing.   Objective: Vital signs in last 24 hours: Temp:  [97.9 F (36.6 C)-98.9 F (37.2 C)] 98.3 F (36.8 C) (09/18 0000) Pulse Rate:  [31-91] 69 (09/18 0400) Cardiac Rhythm: Normal sinus rhythm (09/18 0400) Resp:  [12-28] 12 (09/18 0400) BP: (83-126)/(30-57) 90/47 (09/18 0400) SpO2:  [65 %-100 %] 94 % (09/18 0400) Arterial Line BP: (117-146)/(43-54) 134/47 (09/17 1600)  Filed Weights   05/13/19 0749 05/14/19 0500 05/16/19 0500  Weight: 81.6 kg 82.5 kg 90.3 kg    Weight change:      Intake/Output from previous day: 09/17 0701 - 09/18 0700 In: 2431 [P.O.:410; I.V.:1870.9; IV Piggyback:150] Out: [id number] [Urine:1205; Drains:230; Chest Tube:160]  Intake/Output this shift: No intake/output data recorded.  Current Meds: Scheduled Meds: . acetaminophen  1,000 mg Oral Q6H   Or  . acetaminophen (TYLENOL) oral liquid 160 mg/5 mL  1,000 mg Per Tube Q6H  . atorvastatin  80 mg Oral q1800  . bisacodyl  10 mg Oral Daily   Or  . bisacodyl  10 mg Rectal Daily  . Chlorhexidine Gluconate Cloth  6 each Topical Daily  . clopidogrel  75 mg Oral Daily  . docusate sodium  200 mg Oral Daily  . enoxaparin (LOVENOX) injection  30 mg Subcutaneous QHS  . insulin aspart  0-24 Units Subcutaneous Q4H  . insulin regular  0-10 Units  Intravenous TID WC  . levothyroxine  50 mcg Oral Q0600  . metoprolol tartrate  12.5 mg Oral BID   Or  . metoprolol tartrate  12.5 mg Per Tube BID  . pantoprazole  40 mg Oral Daily  . sodium chloride flush  10-40 mL Intracatheter Q12H  . sodium chloride flush  3 mL Intravenous Q12H   Continuous Infusions: . sodium chloride Stopped (05/16/19 2219)  . sodium chloride    . sodium chloride 20 mL/hr at 05/15/19 1700  . amiodarone 30 mg/hr (05/17/19 0400)  . dexmedetomidine (PRECEDEX) IV infusion Stopped (05/15/19 1701)  . insulin Stopped (05/16/19 1430)  . lactated ringers    . lactated ringers    . lactated ringers 20 mL/hr at 05/17/19 0400  . nitroGLYCERIN Stopped (05/15/19 1428)  . phenylephrine (NEO-SYNEPHRINE) Adult infusion 10 mcg/min (05/17/19 0400)   PRN Meds:.sodium chloride, acetaminophen, lactated ringers, metoprolol tartrate, morphine injection, ondansetron (ZOFRAN) IV, oxyCODONE, sodium chloride flush, sodium chloride flush, traMADol  Physical Exam: General appearance: alert, cooperative and mild distress Neurologic: intact Heart: regular rate and rhythm, Monitor shows mostly NSR with a few PVC's and some pacing.   Lungs: clear to auscultation bilaterally, minimal CT drainage.  Abdomen: Soft and non-tender, rare bowel sounds. Extremities: All well perfused. LE's edematous.  Wound: The sternal incision is coverd with  a negative pressure dressing that is functioning appropriately.  Lab Results: CBC: Recent Labs    05/16/19 1627 05/17/19 0300  WBC 13.7* 8.7  HGB 8.4* 7.5*  HCT 25.7* 24.1*  PLT 176 134*   BMET:  Recent Labs    05/16/19 1627 05/17/19 0300  NA 141 140  K 3.9 3.9  CL 109 109  CO2 23 23  GLUCOSE 137* 138*  BUN 27* 28*  CREATININE 0.95 0.94  CALCIUM 8.0* 8.1*    CMET: Lab Results  Component Value Date   WBC 8.7 05/17/2019   HGB 7.5 (L) 05/17/2019   HCT 24.1 (L) 05/17/2019   PLT 134 (L) 05/17/2019   GLUCOSE 138 (H) 05/17/2019   ALT 14  05/13/2019   AST 22 05/13/2019   NA 140 05/17/2019   K 3.9 05/17/2019   CL 109 05/17/2019   CREATININE 0.94 05/17/2019   BUN 28 (H) 05/17/2019   CO2 23 05/17/2019   TSH 3.265 09/09/2018   INR 1.5 (H) 05/15/2019   HGBA1C 6.3 (H) 05/13/2019      PT/INR:  Recent Labs    05/15/19 1438  LABPROT 17.5*  INR 1.5*   Radiology: No results found.   Assessment/Plan: S/P Procedure(s) (LRB): CORONARY ARTERY BYPASS GRAFTING (CABG) times two, left internal mammary artery to left anterior descending artery and left greater saphenous vein to posterior descending artery, left sapheouns vein harvested endoscopically  (N/A) TRANSESOPHAGEAL ECHOCARDIOGRAM (TEE) (N/A)   -POD2 CABG x 2 for CAD presenting with an acute NSTEMI and pre-op EF 30-35%. VS and hemodynamics stable.  Weaned off milrinone and dopamine.   Will continue to wean neo as tolerated.    D/C remaining chest tube and foley catheter. Advance activity.  Plavix started due to ASA allergy.   -Atrial fibrillation-- IV amiodarone load initiated in the OR 05/15/19.  Holding SR on amiodarone at 0.5mg /min. Will continue infusion and convert to PO after she is tolerating advanced diet. Leave cordis until amio infusion discontinued.  -Volume excess- Wt. remains apporx. 8.5kg > pre-op.  Diurese as tBP allows  -Chronic kidney disease, stage 3- Stable renal function, K+ OK. Monitor.   -Type 2 diabetes mellitus-Pre-op A1C 6.3.  Glucose well controlled with SSI.  Will hold off on resuming oral agents until diet resumed.    -Expected acute blood loss anemia--Hct reasonably stable and no indication for transfusion. Minimal chest tube output.  Monitor.  -Thrombocytopenia- Plt count 194K -> 134K over past 49 hours.  Monitor.   -GERD- Continue PPI.   -DVT PPX- Continue Lovenox .     Antony Odea ,PA-C [phone] 05/17/2019 7:31 AM   Will need intensive PT to get mobile D/c Cordarone in am if no afib  I have seen and  examined Megan [name] and agree with the above assessment  and plan.  Grace Isaac MD Beeper [phone] Office [phone] 05/17/2019 1:13 PM

## 2019-05-17 NOTE — Progress Notes (Signed)
   Awake.  Eating.  Able to follow commands.  No recurrent A. fib.  Currently on IV amiodarone which after speaking with Dr. Servando Snare will likely be discontinued in a.m.  She had atrial fibrillation during surgery and in the early postop phase.  No plan for continuation of IV amnio unless A. fib recurs.  Cardiology in a watchful waiting mode.

## 2019-05-17 NOTE — Progress Notes (Addendum)
Rehab Admissions Coordinator Note:  Per PT recommendation, patient was screened by Michel Santee for appropriateness for an Inpatient Acute Rehab Consult.  At this time, we are recommending Inpatient Rehab consult.  Please place a consult order if pt would like to be considered. Also please note that pt will need OT evaluation to proceed with insurance authorization if deemed a candidate for CIR.   Michel Santee 05/17/2019, 2:28 PM  I can be reached at [phone].

## 2019-05-17 NOTE — Evaluation (Signed)
Physical Therapy Evaluation Patient Details Name: Megan [name] MRN: [id number] DOB: [birth year] Today's Date: 05/17/2019   History of Present Illness  73 y.o. female who presented to the ED with c/o chest pain and admitted for NSTEMI. She underwent CABG 05/15/19. PMH consists of recent R ankle fx (12/2018), L knee DJD, OA, HTN, glaucoma, LE cellulitis, DM, depression, anxiety, and asthma.    Clinical Impression  Pt admitted with above diagnosis. On eval, pt required +2 max assist bed mobility and sit to stand. Total assist transfer OOB using stedy.  Pt currently with functional limitations due to the deficits listed below (see PT Problem List). Pt will benefit from skilled PT to increase their independence and safety with mobility to allow discharge to the venue listed below.       Follow Up Recommendations CIR    Equipment Recommendations  Other (comment)(TBD)    Recommendations for Other Services Rehab consult     Precautions / Restrictions Precautions Precautions: Fall;Sternal Precaution Comments: educated on sternal precautions      Mobility  Bed Mobility Overal bed mobility: Needs Assistance Bed Mobility: Supine to Sit     Supine to sit: +2 for physical assistance;Max assist        Transfers Overall transfer level: Needs assistance   Transfers: Sit to/from Stand;Stand Pivot Transfers Sit to Stand: +2 physical assistance;Max assist Stand pivot transfers: Total assist       General transfer comment: stedy for bed to recliner  Ambulation/Gait             General Gait Details: unable  Stairs            Wheelchair Mobility    Modified Rankin (Stroke Patients Only)       Balance                                             Pertinent Vitals/Pain Pain Assessment: Faces Faces Pain Scale: Hurts even more Pain Location: chest/incision Pain Descriptors / Indicators: Grimacing;Guarding Pain Intervention(s): Limited activity  within patient's tolerance;Monitored during session    Addis expects to be discharged to:: Private residence Living Arrangements: Alone Available Help at Discharge: Family;Personal care attendant;Available PRN/intermittently(Pt is alone at night. She has an AM sitter, HH therapy/RN midday, and PM sitter.) Type of Home: House Home Access: Stairs to enter Entrance Stairs-Rails: None Entrance Stairs-Number of Steps: 1 (small threshold) Home Layout: One level Home Equipment: Shower seat - built in;Grab bars - tub/shower;Hand held shower head;Cane - single point;Walker - 2 wheels;Walker - 4 wheels;Wheelchair - [employer];Tub bench Additional Comments: lift chair    Prior Function Level of Independence: Needs assistance   Gait / Transfers Assistance Needed: amb 58' with RW, assist to get legs into bed  ADL's / Homemaking Assistance Needed: sitter assists with all ADLs and iADLs  Comments: active with HHPT/OT at time of admission     Hand Dominance   Dominant Hand: Right    Extremity/Trunk Assessment   Upper Extremity Assessment Upper Extremity Assessment: Generalized weakness    Lower Extremity Assessment Lower Extremity Assessment: Generalized weakness       Communication   Communication: No difficulties  Cognition Arousal/Alertness: Awake/alert Behavior During Therapy: Anxious Overall Cognitive Status: Within Functional Limits for tasks assessed  General Comments: husband past away June 2019. Multiple health problems since his passing. SNF following cellulitis early 2020. Discharged to her sister's house where she fell down the steps May 2020 sustaining R ankle fx. SNF stay following that hospitalization. Returned home August 2020.      General Comments      Exercises     Assessment/Plan    PT Assessment Patient needs continued PT services  PT Problem List Decreased strength;Decreased  mobility;Decreased knowledge of precautions;Decreased activity tolerance;Decreased balance;Decreased knowledge of use of DME;Pain;Cardiopulmonary status limiting activity       PT Treatment Interventions DME instruction;Therapeutic activities;Gait training;Therapeutic exercise;Patient/family education;Balance training;Functional mobility training    PT Goals (Current goals can be found in the Care Plan section)  Acute Rehab PT Goals Patient Stated Goal: home PT Goal Formulation: With patient Time For Goal Achievement: 05/31/19 Potential to Achieve Goals: Good    Frequency Min 3X/week   Barriers to discharge        Co-evaluation               AM-PAC PT "6 Clicks" Mobility  Outcome Measure Help needed turning from your back to your side while in a flat bed without using bedrails?: A Lot Help needed moving from lying on your back to sitting on the side of a flat bed without using bedrails?: A Lot Help needed moving to and from a bed to a chair (including a wheelchair)?: A Lot Help needed standing up from a chair using your arms (e.g., wheelchair or bedside chair)?: A Lot Help needed to walk in hospital room?: Total Help needed climbing 3-5 steps with a railing? : Total 6 Click Score: 10    End of Session Equipment Utilized During Treatment: Oxygen;Gait belt Activity Tolerance: Patient limited by pain;Other (comment)(anxiety/nausea) Patient left: in chair;with family/visitor present;with call bell/phone within reach Nurse Communication: Need for lift equipment;Mobility status PT Visit Diagnosis: Other abnormalities of gait and mobility (R26.89);Pain;Muscle weakness (generalized) (M62.81)    Time: [id number] PT Time Calculation (min) (ACUTE ONLY): 39 min   Charges:   PT Evaluation $PT Eval Moderate Complexity: 1 Mod PT Treatments $Therapeutic Activity: 23-37 mins        Lorrin Goodell, PT  Office # [phone] Pager [phone]   Lorriane Shire 05/17/2019, 1:22  PM

## 2019-05-18 ENCOUNTER — Inpatient Hospital Stay (HOSPITAL_COMMUNITY): Payer: Medicare Other

## 2019-05-18 LAB — BASIC METABOLIC PANEL
Anion gap: 10 (ref 5–15)
BUN: 27 mg/dL — ABNORMAL HIGH (ref 8–23)
CO2: 25 mmol/L (ref 22–32)
Calcium: 8.4 mg/dL — ABNORMAL LOW (ref 8.9–10.3)
Chloride: 105 mmol/L (ref 98–111)
Creatinine, Ser: 1.02 mg/dL — ABNORMAL HIGH (ref 0.44–1.00)
GFR calc Af Amer: >60 mL/min (ref >60)
GFR calc non Af Amer: 55 mL/min — ABNORMAL LOW (ref >60)
Glucose, Bld: 121 mg/dL — ABNORMAL HIGH (ref 70–99)
Potassium: 4.1 mmol/L (ref 3.5–5.1)
Sodium: 140 mmol/L (ref 135–145)

## 2019-05-18 LAB — BPAM RBC
Blood Product Expiration Date: 202010222359
Blood Product Expiration Date: 202010222359
Blood Product Expiration Date: 202010222359
Blood Product Expiration Date: 202010222359
Blood Product Expiration Date: 202010222359
Blood Product Expiration Date: 202010222359
ISSUE DATE / TIME: 202009160803
ISSUE DATE / TIME: 202009160803
ISSUE DATE / TIME: 202009160803
ISSUE DATE / TIME: 202009160803
ISSUE DATE / TIME: 202009180844
ISSUE DATE / TIME: 202009181053
Unit Type and Rh: 5100
Unit Type and Rh: 5100
Unit Type and Rh: 5100
Unit Type and Rh: 5100
Unit Type and Rh: 5100
Unit Type and Rh: 5100

## 2019-05-18 LAB — CBC
HCT: 24.9 % — ABNORMAL LOW (ref 36.0–46.0)
Hemoglobin: 7.7 g/dL — ABNORMAL LOW (ref 12.0–15.0)
MCH: 28.9 pg (ref 26.0–34.0)
MCHC: 30.9 g/dL (ref 30.0–36.0)
MCV: 93.6 fL (ref 80.0–100.0)
Platelets: 215 10*3/uL (ref 150–400)
RBC: 2.66 MIL/uL — ABNORMAL LOW (ref 3.87–5.11)
RDW: 16 % — ABNORMAL HIGH (ref 11.5–15.5)
WBC: 12.1 10*3/uL — ABNORMAL HIGH (ref 4.0–10.5)
nRBC: 0 % (ref 0.0–0.2)

## 2019-05-18 LAB — TYPE AND SCREEN
ABO/RH(D): O POS
Antibody Screen: NEGATIVE
Unit division: 0
Unit division: 0
Unit division: 0
Unit division: 0
Unit division: 0
Unit division: 0

## 2019-05-18 LAB — COOXEMETRY PANEL
Carboxyhemoglobin: 1 % (ref 0.5–1.5)
Methemoglobin: 0.6 % (ref 0.0–1.5)
O2 Saturation: 59.7 %
Total hemoglobin: 9.2 g/dL — ABNORMAL LOW (ref 12.0–16.0)

## 2019-05-18 LAB — GLUCOSE, CAPILLARY
Glucose-Capillary: 113 mg/dL — ABNORMAL HIGH (ref 70–99)
Glucose-Capillary: 146 mg/dL — ABNORMAL HIGH (ref 70–99)
Glucose-Capillary: 118 mg/dL — ABNORMAL HIGH (ref 70–99)
Glucose-Capillary: 127 mg/dL — ABNORMAL HIGH (ref 70–99)
Glucose-Capillary: 131 mg/dL — ABNORMAL HIGH (ref 70–99)

## 2019-05-18 NOTE — Progress Notes (Signed)
3 Days Post-Op Procedure(s) (LRB): CORONARY ARTERY BYPASS GRAFTING (CABG) times two, left internal mammary artery to left anterior descending artery and left greater saphenous vein to posterior descending artery, left sapheouns vein harvested endoscopically  (N/A) TRANSESOPHAGEAL ECHOCARDIOGRAM (TEE) (N/A) Subjective: Easy fatigue  Objective: Vital signs in last 24 hours: Temp:  [98 F (36.7 C)-99.3 F (37.4 C)] 98 F (36.7 C) (09/19 0400) Pulse Rate:  [56-79] 62 (09/19 0600) Cardiac Rhythm: Atrial fibrillation (09/19 0400) Resp:  [7-28] 13 (09/19 0600) BP: (80-128)/(36-58) 101/45 (09/19 0600) SpO2:  [98 %-100 %] 100 % (09/19 0600) Weight:  [92.2 kg] 92.2 kg (09/19 0500)  Hemodynamic parameters for last 24 hours:    Intake/Output from previous day: 09/18 0701 - 09/19 0700 In: 1256.5 [P.O.:240; I.V.:1016.5] Out: 1260 [Urine:1250; Chest Tube:10] Intake/Output this shift: No intake/output data recorded.  General appearance: alert, cooperative and no distress Neurologic: intact Heart: regular rate and rhythm, S1, S2 normal, no murmur, click, rub or gallop Lungs: diminished breath sounds bibasilar Extremities: edema 1+ Wound: Prevena in place, intact  Lab Results: Recent Labs    05/17/19 0300 05/18/19 0414  WBC 8.7 12.1*  HGB 7.5* 7.7*  HCT 24.1* 24.9*  PLT 134* 215   BMET:  Recent Labs    05/17/19 0300 05/18/19 0414  NA 140 140  K 3.9 4.1  CL 109 105  CO2 23 25  GLUCOSE 138* 121*  BUN 28* 27*  CREATININE 0.94 1.02*  CALCIUM 8.1* 8.4*    PT/INR:  Recent Labs    05/15/19 1438  LABPROT 17.5*  INR 1.5*   ABG    Component Value Date/Time   PHART 7.263 (L) 05/15/2019 2129   HCO3 18.2 (L) 05/15/2019 2129   TCO2 19 (L) 05/15/2019 2129   ACIDBASEDEF 8.0 (H) 05/15/2019 2129   O2SAT 59.7 05/18/2019 0420   CBG (last 3)  Recent Labs    05/17/19 1953 05/17/19 2356 05/18/19 0406  GLUCAP 121* 116* 113*    Assessment/Plan: S/P Procedure(s)  (LRB): CORONARY ARTERY BYPASS GRAFTING (CABG) times two, left internal mammary artery to left anterior descending artery and left greater saphenous vein to posterior descending artery, left sapheouns vein harvested endoscopically  (N/A) TRANSESOPHAGEAL ECHOCARDIOGRAM (TEE) (N/A) Mobilize Diuresis CIR consult   LOS: 5 days    Wonda Olds 05/18/2019

## 2019-05-19 LAB — BASIC METABOLIC PANEL
Anion gap: 9 (ref 5–15)
BUN: 26 mg/dL — ABNORMAL HIGH (ref 8–23)
CO2: 24 mmol/L (ref 22–32)
Calcium: 8.2 mg/dL — ABNORMAL LOW (ref 8.9–10.3)
Chloride: 105 mmol/L (ref 98–111)
Creatinine, Ser: 1.01 mg/dL — ABNORMAL HIGH (ref 0.44–1.00)
GFR calc Af Amer: >60 mL/min (ref >60)
GFR calc non Af Amer: 55 mL/min — ABNORMAL LOW (ref >60)
Glucose, Bld: 108 mg/dL — ABNORMAL HIGH (ref 70–99)
Potassium: 4 mmol/L (ref 3.5–5.1)
Sodium: 138 mmol/L (ref 135–145)

## 2019-05-19 LAB — GLUCOSE, CAPILLARY
Glucose-Capillary: 102 mg/dL — ABNORMAL HIGH (ref 70–99)
Glucose-Capillary: 116 mg/dL — ABNORMAL HIGH (ref 70–99)
Glucose-Capillary: 129 mg/dL — ABNORMAL HIGH (ref 70–99)
Glucose-Capillary: 129 mg/dL — ABNORMAL HIGH (ref 70–99)
Glucose-Capillary: 161 mg/dL — ABNORMAL HIGH (ref 70–99)
Glucose-Capillary: 84 mg/dL (ref 70–99)
Glucose-Capillary: 93 mg/dL (ref 70–99)

## 2019-05-19 LAB — COOXEMETRY PANEL
Carboxyhemoglobin: 1.3 % (ref 0.5–1.5)
Methemoglobin: 0.7 % (ref 0.0–1.5)
O2 Saturation: 65 %
Total hemoglobin: 8.2 g/dL — ABNORMAL LOW (ref 12.0–16.0)

## 2019-05-19 MED ORDER — MIDODRINE HCL 5 MG PO TABS
5.0000 mg | ORAL_TABLET | Freq: Three times a day (TID) | ORAL | Status: DC
  Administered 2019-05-19 – 2019-05-20 (×2): 5 mg via ORAL
  Filled 2019-05-19 (×2): qty 1

## 2019-05-19 NOTE — Progress Notes (Signed)
4 Days Post-Op Procedure(s) (LRB): CORONARY ARTERY BYPASS GRAFTING (CABG) times two, left internal mammary artery to left anterior descending artery and left greater saphenous vein to posterior descending artery, left sapheouns vein harvested endoscopically  (N/A) TRANSESOPHAGEAL ECHOCARDIOGRAM (TEE) (N/A) Subjective: Feeling much better; getting more mobile  Objective: Vital signs in last 24 hours: Temp:  [97.8 F (36.6 C)-98.3 F (36.8 C)] 98.3 F (36.8 C) (09/20 1634) Pulse Rate:  [56-73] 73 (09/20 1000) Cardiac Rhythm: (P) Normal sinus rhythm (09/20 1600) Resp:  [11-24] 15 (09/20 1600) BP: (94-151)/(44-87) 117/50 (09/20 1600) SpO2:  [95 %-100 %] 100 % (09/20 1000) Weight:  [95.1 kg] 95.1 kg (09/20 0500)  Hemodynamic parameters for last 24 hours:    Intake/Output from previous day: 09/19 0701 - 09/20 0700 In: 2658.6 [P.O.:480; I.V.:2178.6] Out: 1400 [Urine:1400] Intake/Output this shift: Total I/O In: 1331.7 [P.O.:960; I.V.:371.7] Out: 1200 [Urine:1200]  General appearance: alert, cooperative, no distress and moderately obese Neurologic: intact Heart: regular rate and rhythm, S1, S2 normal, no murmur, click, rub or gallop Lungs: diminished breath sounds bibasilar Extremities: edema mild Wound: Prevena intact  Lab Results: Recent Labs    05/17/19 0300 05/18/19 0414  WBC 8.7 12.1*  HGB 7.5* 7.7*  HCT 24.1* 24.9*  PLT 134* 215   BMET:  Recent Labs    05/18/19 0414 05/19/19 0323  NA 140 138  K 4.1 4.0  CL 105 105  CO2 25 24  GLUCOSE 121* 108*  BUN 27* 26*  CREATININE 1.02* 1.01*  CALCIUM 8.4* 8.2*    PT/INR: No results for input(s): LABPROT, INR in the last 72 hours. ABG    Component Value Date/Time   PHART 7.263 (L) 05/15/2019 2129   HCO3 18.2 (L) 05/15/2019 2129   TCO2 19 (L) 05/15/2019 2129   ACIDBASEDEF 8.0 (H) 05/15/2019 2129   O2SAT 65.0 05/19/2019 0330   CBG (last 3)  Recent Labs    05/19/19 0749 05/19/19 1135 05/19/19 1633  GLUCAP  129* 84 116*    Assessment/Plan: S/P Procedure(s) (LRB): CORONARY ARTERY BYPASS GRAFTING (CABG) times two, left internal mammary artery to left anterior descending artery and left greater saphenous vein to posterior descending artery, left sapheouns vein harvested endoscopically  (N/A) TRANSESOPHAGEAL ECHOCARDIOGRAM (TEE) (N/A) Mobilize Diuresis start midodrine to d/c neo  CIR consult   LOS: 6 days    Wonda Olds 05/19/2019

## 2019-05-20 ENCOUNTER — Inpatient Hospital Stay (HOSPITAL_COMMUNITY): Payer: Medicare Other

## 2019-05-20 LAB — GLUCOSE, CAPILLARY
Glucose-Capillary: 103 mg/dL — ABNORMAL HIGH (ref 70–99)
Glucose-Capillary: 109 mg/dL — ABNORMAL HIGH (ref 70–99)
Glucose-Capillary: 144 mg/dL — ABNORMAL HIGH (ref 70–99)
Glucose-Capillary: 117 mg/dL — ABNORMAL HIGH (ref 70–99)
Glucose-Capillary: 125 mg/dL — ABNORMAL HIGH (ref 70–99)
Glucose-Capillary: 129 mg/dL — ABNORMAL HIGH (ref 70–99)

## 2019-05-20 LAB — COOXEMETRY PANEL
Carboxyhemoglobin: 1.7 % — ABNORMAL HIGH (ref 0.5–1.5)
Methemoglobin: 1.3 % (ref 0.0–1.5)
O2 Saturation: 70.4 %
Total hemoglobin: 7.8 g/dL — ABNORMAL LOW (ref 12.0–16.0)

## 2019-05-20 LAB — BASIC METABOLIC PANEL
Anion gap: 7 (ref 5–15)
BUN: 22 mg/dL (ref 8–23)
CO2: 25 mmol/L (ref 22–32)
Calcium: 7.9 mg/dL — ABNORMAL LOW (ref 8.9–10.3)
Chloride: 107 mmol/L (ref 98–111)
Creatinine, Ser: 0.93 mg/dL (ref 0.44–1.00)
GFR calc Af Amer: >60 mL/min (ref >60)
GFR calc non Af Amer: >60 mL/min (ref >60)
Glucose, Bld: 112 mg/dL — ABNORMAL HIGH (ref 70–99)
Potassium: 3.9 mmol/L (ref 3.5–5.1)
Sodium: 139 mmol/L (ref 135–145)

## 2019-05-20 LAB — CBC WITH DIFFERENTIAL/PLATELET
Abs Immature Granulocytes: 0.04 10*3/uL (ref 0.00–0.07)
Basophils Absolute: 0 10*3/uL (ref 0.0–0.1)
Basophils Relative: 0 %
Eosinophils Absolute: 0.1 10*3/uL (ref 0.0–0.5)
Eosinophils Relative: 2 %
HCT: 24.9 % — ABNORMAL LOW (ref 36.0–46.0)
Hemoglobin: 8 g/dL — ABNORMAL LOW (ref 12.0–15.0)
Immature Granulocytes: 1 %
Lymphocytes Relative: 18 %
Lymphs Abs: 1.3 10*3/uL (ref 0.7–4.0)
MCH: 29.4 pg (ref 26.0–34.0)
MCHC: 32.1 g/dL (ref 30.0–36.0)
MCV: 91.5 fL (ref 80.0–100.0)
Monocytes Absolute: 0.6 10*3/uL (ref 0.1–1.0)
Monocytes Relative: 7 %
Neutro Abs: 5.4 10*3/uL (ref 1.7–7.7)
Neutrophils Relative %: 72 %
Platelets: 213 10*3/uL (ref 150–400)
RBC: 2.72 MIL/uL — ABNORMAL LOW (ref 3.87–5.11)
RDW: 15.9 % — ABNORMAL HIGH (ref 11.5–15.5)
WBC: 7.4 10*3/uL (ref 4.0–10.5)
nRBC: 0 % (ref 0.0–0.2)

## 2019-05-20 MED ORDER — MOVING RIGHT ALONG BOOK
Freq: Once | Status: DC
  Filled 2019-05-20 (×2): qty 1

## 2019-05-20 MED ORDER — INSULIN ASPART 100 UNIT/ML ~~LOC~~ SOLN
0.0000 [IU] | Freq: Three times a day (TID) | SUBCUTANEOUS | Status: DC
  Administered 2019-05-20 – 2019-05-21 (×2): 2 [IU] via SUBCUTANEOUS
  Administered 2019-05-21 – 2019-05-22 (×2): 4 [IU] via SUBCUTANEOUS
  Administered 2019-05-22 – 2019-05-23 (×3): 2 [IU] via SUBCUTANEOUS

## 2019-05-20 MED ORDER — SODIUM CHLORIDE 0.9% FLUSH
3.0000 mL | Freq: Two times a day (BID) | INTRAVENOUS | Status: DC
  Administered 2019-05-21 – 2019-05-29 (×11): 3 mL via INTRAVENOUS

## 2019-05-20 MED ORDER — SODIUM CHLORIDE 0.9 % IV SOLN: 250.0000 mL | INTRAVENOUS | Status: DC | PRN

## 2019-05-20 MED ORDER — SODIUM CHLORIDE 0.9% FLUSH
3.0000 mL | INTRAVENOUS | Status: DC | PRN
  Administered 2019-05-24: 3 mL via INTRAVENOUS
  Filled 2019-05-20: qty 3

## 2019-05-20 NOTE — Progress Notes (Signed)
Inpatient Rehab Admissions:  Inpatient Rehab Consult received.  I met with pt and her sister at the bedside for rehabilitation assessment. AC discussed estimated length of stay on rehab and anticipated assist level at DC. Per pt, she has assistance from 7am-11am and 5pm to 9pm (mostly for IADLs). AC anticipates pt will still need more physical assist after CIR stay. Pt's sister is also concerned about her lack of full time assistance at home.   Pt would like more time to discuss her options with her sister and see if they can come up with a more stable DC plan that would support an IP Rehab stay.   Will follow up with pt tomorrow. However, given pt's sister's concerns, anticipate pt will need a longer, slower-paced rehab, such as a SNF. CM aware.   Jhonnie Garner, OTR/L  Rehab Admissions Coordinator  [phone] 05/20/2019 4:39 PM

## 2019-05-20 NOTE — Progress Notes (Signed)
Physical Therapy Treatment Patient Details Name: Megan [name] MRN: [id number] DOB: 09/02/1945 Today's Date: 05/20/2019    History of Present Illness 73 y.o. female who presented to the ED with c/o chest pain and admitted for NSTEMI. She underwent CABG 05/15/19. PMH consists of recent R ankle fx (12/2018), L knee DJD, OA, HTN, glaucoma, LE cellulitis, DM, depression, anxiety, and asthma.    PT Comments    Pt admitted with above diagnosis. Pt was able to stand in [place]  with min assist and cues.  Incr ability to stand for longer intervals today with cues for postural stability.  Pt anxious but does well with cues. Used STedy to transfer pt back to bed.  Pt currently with functional limitations due to balance and endurance deficits. Pt will benefit from skilled PT to increase their independence and safety with mobility to allow discharge to the venue listed below.     Follow Up Recommendations  CIR     Equipment Recommendations  Other (comment)(TBD)    Recommendations for Other Services Rehab consult     Precautions / Restrictions Precautions Precautions: Fall;Sternal Precaution Comments: educated on sternal precautions, good recall of precautions without cueing Restrictions Weight Bearing Restrictions: Yes Other Position/Activity Restrictions: sternal precautions     Mobility  Bed Mobility Overal bed mobility: Needs Assistance Bed Mobility: Sit to Supine       Sit to supine: Max assist;+2 for physical assistance   General bed mobility comments: guiding support of trunk to supine, total assist for LB mgmt   Transfers Overall transfer level: Needs assistance   Transfers: Sit to/from Stand;Stand Pivot Transfers Sit to Stand: +2 physical assistance;Max assist Stand pivot transfers: Total assist(using stedy)       General transfer comment: sit to stand from recliner with max assist +2, utilized stedy to transtion to EOB; cueing throughout session for hand placement  technique and safety.  Pt stood to [facility] 1 minute and 22 seconds and then rested and then stood 2 minutes and 35 seconds.  Able to stand with use of pad to boost pt and it was more difficult from low recliner than from the [place].   Ambulation/Gait             General Gait Details: unable   Stairs             Wheelchair Mobility    Modified Rankin (Stroke Patients Only)       Balance Overall balance assessment: Needs assistance Sitting-balance support: No upper extremity supported;Feet supported Sitting balance-Leahy Scale: Fair     Standing balance support: Bilateral upper extremity supported;During functional activity Standing balance-Leahy Scale: Poor Standing balance comment: reliant on BUE and external support                            Cognition Arousal/Alertness: Awake/alert Behavior During Therapy: Anxious Overall Cognitive Status: Within Functional Limits for tasks assessed                                 General Comments: husband past away June 2019. Multiple health problems since his passing. SNF following cellulitis early 2020. Discharged to her sister's house where she fell down the steps May 2020 sustaining R ankle fx. SNF stay following that hospitalization. Returned home August 2020.      Exercises      General Comments General comments (skin integrity, edema, etc.):  VSS during session      Pertinent Vitals/Pain Pain Assessment: Faces Faces Pain Scale: Hurts even more Pain Location: chest/incision Pain Descriptors / Indicators: Grimacing;Guarding Pain Intervention(s): Limited activity within patient's tolerance;Monitored during session;Repositioned;Premedicated before session    Home Living Family/patient expects to be discharged to:: Private residence Living Arrangements: Alone Available Help at Discharge: Family;Personal care attendant;Available PRN/intermittently Type of Home: House Home Access: Stairs to  enter Entrance Stairs-Rails: None Home Layout: One level Home Equipment: Shower seat - built in;Grab bars - tub/shower;Hand held shower head;Cane - single point;Walker - 2 wheels;Walker - 4 wheels;Wheelchair - [employer];Tub bench;Adaptive equipment Additional Comments: lift chair    Prior Function Level of Independence: Needs assistance  Gait / Transfers Assistance Needed: amb 44' with RW, assist to get legs into bed ADL's / Homemaking Assistance Needed: able to complete UB ADLs, some assist with LB ADLs using sock aide/reacher, indep toileting; assist with IADLs  Comments: active with HHPT/OT at time of admission   PT Goals (current goals can now be found in the care plan section) Acute Rehab PT Goals Patient Stated Goal: to get stronger and be able to take care of myself Progress towards PT goals: Progressing toward goals    Frequency    Min 3X/week      PT Plan Current plan remains appropriate    Co-evaluation PT/OT/SLP Co-Evaluation/Treatment: Yes Reason for Co-Treatment: Complexity of the patient's impairments (multi-system involvement);For patient/therapist safety PT goals addressed during session: Mobility/safety with mobility        AM-PAC PT "6 Clicks" Mobility   Outcome Measure  Help needed turning from your back to your side while in a flat bed without using bedrails?: A Lot Help needed moving from lying on your back to sitting on the side of a flat bed without using bedrails?: A Lot Help needed moving to and from a bed to a chair (including a wheelchair)?: A Lot Help needed standing up from a chair using your arms (e.g., wheelchair or bedside chair)?: A Lot Help needed to walk in hospital room?: Total Help needed climbing 3-5 steps with a railing? : Total 6 Click Score: 10    End of Session Equipment Utilized During Treatment: Gait belt Activity Tolerance: Patient limited by pain;Other (comment);Patient limited by fatigue(anxiety) Patient left:  with call bell/phone within reach;in bed Nurse Communication: Mobility status;Precautions PT Visit Diagnosis: Other abnormalities of gait and mobility (R26.89);Pain;Muscle weakness (generalized) (M62.81)     Time: [id number] PT Time Calculation (min) (ACUTE ONLY): 28 min  Charges:  $Therapeutic Activity: 8-22 mins                     [place] Pager:  [phone]  Office:  [place] 05/20/2019, 11:01 AM

## 2019-05-20 NOTE — Progress Notes (Addendum)
[facility] DAILY ICU PROGRESS NOTE                   [address]            [place] [address]          [phone]   5 Days Post-Op Procedure(s) (LRB): CORONARY ARTERY BYPASS GRAFTING (CABG) times two, left internal mammary artery to left anterior descending artery and left greater saphenous vein to posterior descending artery, left sapheouns vein harvested endoscopically  (N/A) TRANSESOPHAGEAL ECHOCARDIOGRAM (TEE) (N/A)  Total Length of Stay:  LOS: 7 days   Subjective: Up in the chair, ate most of her breakfast.  She is pleased with her progress so far but is eager to work on mobility and getting more independent with self care.   Objective: Vital signs in last 24 hours: Temp:  [97.7 F (36.5 C)-98.7 F (37.1 C)] 98.2 F (36.8 C) (09/21 0740) Pulse Rate:  [71-76] 76 (09/21 0700) Cardiac Rhythm: Normal sinus rhythm (09/21 0400) Resp:  [13-25] 18 (09/21 0700) BP: (96-136)/(44-80) 115/46 (09/21 0500) SpO2:  [96 %-100 %] 97 % (09/21 0700) Weight:  [96.2 kg] 96.2 kg (09/21 0500)  Filed Weights   05/18/19 0500 05/19/19 0500 05/20/19 0500  Weight: 92.2 kg 95.1 kg 96.2 kg    Weight change: 1.1 kg      Intake/Output from previous day: 09/20 0701 - 09/21 0700 In: 1630.3 [P.O.:960; I.V.:670.3] Out: 2600 [Urine:2600]  Intake/Output this shift: No intake/output data recorded.  Current Meds: Scheduled Meds: . acetaminophen  1,000 mg Oral Q6H   Or  . acetaminophen (TYLENOL) oral liquid 160 mg/5 mL  1,000 mg Per Tube Q6H  . atorvastatin  80 mg Oral q1800  . bisacodyl  10 mg Oral Daily   Or  . bisacodyl  10 mg Rectal Daily  . Chlorhexidine Gluconate Cloth  6 each Topical Daily  . clopidogrel  75 mg Oral Daily  . docusate sodium  200 mg Oral Daily  . enoxaparin (LOVENOX) injection  30 mg Subcutaneous QHS  . furosemide  40 mg Intravenous Q12H  . insulin aspart  0-24 Units Subcutaneous Q4H  . levothyroxine  50 mcg Oral Q0600  . metoprolol tartrate  12.5 mg  Oral BID   Or  . metoprolol tartrate  12.5 mg Per Tube BID  . midodrine  5 mg Oral TID WC  . pantoprazole  40 mg Oral Daily  . potassium chloride  20 mEq Oral BID  . sodium chloride flush  10-40 mL Intracatheter Q12H  . sodium chloride flush  3 mL Intravenous Q12H   Continuous Infusions: . sodium chloride Stopped (05/18/19 1122)  . sodium chloride    . sodium chloride 20 mL/hr at 05/15/19 1700  . amiodarone 30 mg/hr (05/20/19 0600)  . dexmedetomidine (PRECEDEX) IV infusion Stopped (05/15/19 1701)  . lactated ringers    . lactated ringers    . lactated ringers Stopped (05/17/19 0920)  . phenylephrine (NEO-SYNEPHRINE) Adult infusion Stopped (05/20/19 0400)   PRN Meds:.sodium chloride, acetaminophen, lactated ringers, metoprolol tartrate, ondansetron (ZOFRAN) IV, oxyCODONE, sodium chloride flush, sodium chloride flush, traMADol  General appearance: alert, cooperative and no distress Neurologic: intact Heart: RRR, monitor review shows no further atrial fibrillation. Lungs: Breath sounds are clear, diminished over left lower lung zone.  Abdomen: soft and not-tender.  Extremities: Significant edema in lower extremities persists. The left EVH incision is intact and healing with no sings of complication..  Wound: A Prevena negative pressure dressing  is in place over the sternal incision.   Lab Results: CBC: Recent Labs    05/18/19 0414 05/20/19 0408  WBC 12.1* 7.4  HGB 7.7* 8.0*  HCT 24.9* 24.9*  PLT 215 213   BMET:  Recent Labs    05/19/19 0323 05/20/19 0408  NA 138 139  K 4.0 3.9  CL 105 107  CO2 24 25  GLUCOSE 108* 112*  BUN 26* 22  CREATININE 1.01* 0.93  CALCIUM 8.2* 7.9*    CMET: Lab Results  Component Value Date   WBC 7.4 05/20/2019   HGB 8.0 (L) 05/20/2019   HCT 24.9 (L) 05/20/2019   PLT 213 05/20/2019   GLUCOSE 112 (H) 05/20/2019   ALT 14 05/13/2019   AST 22 05/13/2019   NA 139 05/20/2019   K 3.9 05/20/2019   CL 107 05/20/2019   CREATININE 0.93  05/20/2019   BUN 22 05/20/2019   CO2 25 05/20/2019   TSH 3.265 09/09/2018   INR 1.5 (H) 05/15/2019   HGBA1C 6.3 (H) 05/13/2019      PT/INR: No results for input(s): LABPROT, INR in the last 72 hours. Radiology: Dg Chest 1 View  Result Date: 05/20/2019 CLINICAL DATA:  Sore chest.  CABG. EXAM: CHEST  1 VIEW COMPARISON:  05/18/2019. FINDINGS: Right IJ sheath in stable position. Prior CABG. Epicardial pacing wires noted. Stable cardiomegaly. Left base dense consolidation consistent with atelectasis/infiltrate. Mild right base atelectasis. Increasing bilateral pleural effusions, left side greater than right. No pneumothorax. IMPRESSION: 1.  Right IJ sheath in stable position. 2.  Prior CABG.  Stable cardiomegaly. 3 left base dense consolidation consistent atelectasis/infiltrate. Mild right base atelectasis. Increasing bilateral pleural effusions, left side greater than right. Electronically Signed   By: Marcello Moores  Register   On: 05/20/2019 07:09     Assessment/Plan: S/P Procedure(s) (LRB): CORONARY ARTERY BYPASS GRAFTING (CABG) times two, left internal mammary artery to left anterior descending artery and left greater saphenous vein to posterior descending artery, left sapheouns vein harvested endoscopically  (N/A) TRANSESOPHAGEAL ECHOCARDIOGRAM (TEE) (N/A)   -POD5 CABG x 2 for CAD presenting with an acute NSTEMI and pre-op EF 30-35%. VS and hemodynamics stable.  Neo-Synephrine discontinued yesterday and BP reasonable. Making slow progress with mobility. Will benefit from inpatient rehab as she was living alone prior to admission and wants to return to independent living.   Continue low-dose metoprolol as tolerated. Continue Plavix due to ASA allergy. Continue PT, OT, rehab consult placed.  -Atrial fibrillation-- IV amiodarone load initiated in the OR 05/15/19.  Holding SR on amiodarone at 0.5mg /min.with no further atrial fibrillation. Will discontinue amiodarone infusion and observe rhythm.     -Volume excess- Wt. remains several Kg. >pre-op. Developing a left pleural effusion.  Diurese as BP allows. Continue IV Lasix.   -Chronic kidney disease, stage 3- Stable renal function, K+ OK. Monitor.   -Type 2 diabetes mellitus-Pre-op A1C 6.3. Glucose well controlled with SSI.    -Expected acute blood loss anemia--Hct remains stable and no indication for transfusion.  -Thrombocytopenia- Plt count recovering  -GERD- Continue PPI.   -DVT PPX- Continue Lovenox .   Antony Odea, PA-C [phone] 05/20/2019 7:57 AM   I have seen and examined Megan [name] and agree with the above assessment  and plan.  Grace Isaac MD [facility] [phone] Office [phone] 05/20/2019 3:32 PM

## 2019-05-20 NOTE — Evaluation (Signed)
Occupational Therapy Evaluation Patient Details Name: Megan [name] MRN: [id number] DOB: Dec 26, 1945 Today's Date: 05/20/2019    History of Present Illness 73 y.o. female who presented to the ED with c/o chest pain and admitted for NSTEMI. She underwent CABG 05/15/19. PMH consists of recent R ankle fx (12/2018), L knee DJD, OA, HTN, glaucoma, LE cellulitis, DM, depression, anxiety, and asthma.   Clinical Impression   PTA patient reports some assist required with ADLs, completing mobility with RW.  She was admitted for above and is limited by problem list below, including pain, sternal precautions, generalized weakness, decreased activity tolerance, impaired balance, and anxiety with mobility.  She currently requires max assist +2 for transfers using stedy, min-mod assist for UB ADLs, max-total assist for LB ADLs.  She is highly motivated and believe she will be a great candidate for intensive CIR level rehab in order to maximize independence with ADLs/mobility. Will follow acutely.     Follow Up Recommendations  CIR;Supervision/Assistance - 24 hour    Equipment Recommendations  None recommended by OT    Recommendations for Other Services Rehab consult     Precautions / Restrictions Precautions Precautions: Fall;Sternal Precaution Comments: educated on sternal precautions, good recall of precautions without cueing Restrictions Weight Bearing Restrictions: Yes Other Position/Activity Restrictions: sternal precautions       Mobility Bed Mobility Overal bed mobility: Needs Assistance Bed Mobility: Sit to Supine       Sit to supine: Max assist;+2 for physical assistance   General bed mobility comments: guiding support of trunk to supine, total assist for LB mgmt   Transfers Overall transfer level: Needs assistance   Transfers: Sit to/from Stand;Stand Pivot Transfers Sit to Stand: +2 physical assistance;Max assist Stand pivot transfers: Total assist(using stedy)        General transfer comment: sit to stand from recliner with max assist +2, utilized stedy to transtion to EOB; cueing throughout session for hand placement technique and safety     Balance Overall balance assessment: Needs assistance Sitting-balance support: No upper extremity supported;Feet supported Sitting balance-Leahy Scale: Fair     Standing balance support: Bilateral upper extremity supported;During functional activity Standing balance-Leahy Scale: Poor Standing balance comment: relaint on BUE and external support                           ADL either performed or assessed with clinical judgement   ADL Overall ADL's : Needs assistance/impaired     Grooming: Supervision/safety;Sitting   Upper Body Bathing: Sitting;Minimal assistance   Lower Body Bathing: +2 for physical assistance;Sit to/from stand;Maximal assistance   Upper Body Dressing : Moderate assistance;Sitting   Lower Body Dressing: Total assistance;+2 for physical assistance;Sit to/from stand   Toilet Transfer: Maximal assistance;+2 for physical assistance Toilet Transfer Details (indicate cue type and reason): sit to stand, using stedy         Functional mobility during ADLs: Maximal assistance;+2 for physical assistance;+2 for safety/equipment General ADL Comments: pt limited by pain, weakness, anxiety       Vision   Vision Assessment?: No apparent visual deficits     Perception     Praxis      Pertinent Vitals/Pain Pain Assessment: Faces Faces Pain Scale: Hurts even more Pain Location: chest/incision Pain Descriptors / Indicators: Grimacing;Guarding Pain Intervention(s): Monitored during session;Repositioned     Hand Dominance Right   Extremity/Trunk Assessment Upper Extremity Assessment Upper Extremity Assessment: Generalized weakness(limited shoulder flexion due to arthritis )  Lower Extremity Assessment Lower Extremity Assessment: Generalized weakness       Communication  Communication Communication: No difficulties   Cognition Arousal/Alertness: Awake/alert Behavior During Therapy: Anxious Overall Cognitive Status: Within Functional Limits for tasks assessed                                 General Comments: husband past away June 2019. Multiple health problems since his passing. SNF following cellulitis early 2020. Discharged to her sister's house where she fell down the steps May 2020 sustaining R ankle fx. SNF stay following that hospitalization. Returned home August 2020.   General Comments  pt pleasant and highly motivated; VSS during session    Exercises     Shoulder Instructions      Home Living Family/patient expects to be discharged to:: Private residence Living Arrangements: Alone Available Help at Discharge: Family;Personal care attendant;Available PRN/intermittently Type of Home: House Home Access: Stairs to enter Entrance Stairs-Number of Steps: 1 (small threshold) Entrance Stairs-Rails: None Home Layout: One level     Bathroom Shower/Tub: Occupational psychologist: Handicapped height     Home Equipment: Shower seat - built in;Grab bars - tub/shower;Hand held shower head;Cane - single point;Walker - 2 wheels;Walker - 4 wheels;Wheelchair - [employer];Tub bench;Adaptive equipment Adaptive Equipment: Reacher;Sock aid Additional Comments: lift chair      Prior Functioning/Environment Level of Independence: Needs assistance  Gait / Transfers Assistance Needed: amb 91' with RW, assist to get legs into bed ADL's / Homemaking Assistance Needed: able to complete UB ADLs, some assist with LB ADLs using sock aide/reacher, indep toileting; assist with IADLs    Comments: active with HHPT/OT at time of admission        OT Problem List: Decreased strength;Decreased activity tolerance;Impaired balance (sitting and/or standing);Decreased range of motion;Decreased safety awareness;Decreased knowledge of use  of DME or AE;Decreased knowledge of precautions;Cardiopulmonary status limiting activity;Pain;Obesity      OT Treatment/Interventions: Self-care/ADL training;Energy conservation;DME and/or AE instruction;Therapeutic exercise;Therapeutic activities;Balance training;Patient/family education;Cognitive remediation/compensation    OT Goals(Current goals can be found in the care plan section) Acute Rehab OT Goals Patient Stated Goal: to get stronger and be able to take care of myself OT Goal Formulation: With patient Time For Goal Achievement: 06/03/19 Potential to Achieve Goals: Fair  OT Frequency: Min 3X/week   Barriers to D/C:            Co-evaluation              AM-PAC OT "6 Clicks" Daily Activity     Outcome Measure Help from another person eating meals?: A Little Help from another person taking care of personal grooming?: A Little Help from another person toileting, which includes using toliet, bedpan, or urinal?: Total Help from another person bathing (including washing, rinsing, drying)?: A Lot Help from another person to put on and taking off regular upper body clothing?: A Lot Help from another person to put on and taking off regular lower body clothing?: Total 6 Click Score: 12   End of Session Equipment Utilized During Treatment: Other (comment)(stedy) Nurse Communication: Mobility status  Activity Tolerance: Patient tolerated treatment well Patient left: in bed;with call bell/phone within reach;with bed alarm set  OT Visit Diagnosis: Other abnormalities of gait and mobility (R26.89);Pain;Muscle weakness (generalized) (M62.81) Pain - part of body: (sternum- incisional)                Time: [id number] OT  Time Calculation (min): 28 min Charges:  OT General Charges $OT Visit: 1 Visit OT Evaluation $OT Eval Moderate Complexity: [place], [place] Acute Rehabilitation Services Pager [phone] Office [phone]   Delight Stare 05/20/2019, 10:00  AM

## 2019-05-21 ENCOUNTER — Encounter: Payer: Medicare Other | Admitting: Neurology

## 2019-05-21 ENCOUNTER — Inpatient Hospital Stay (HOSPITAL_COMMUNITY): Payer: Medicare Other

## 2019-05-21 ENCOUNTER — Other Ambulatory Visit: Payer: Medicare Other

## 2019-05-21 ENCOUNTER — Ambulatory Visit: Payer: Medicare Other | Admitting: Neurology

## 2019-05-21 LAB — BASIC METABOLIC PANEL
Anion gap: 7 (ref 5–15)
BUN: 23 mg/dL (ref 8–23)
CO2: 27 mmol/L (ref 22–32)
Calcium: 7.8 mg/dL — ABNORMAL LOW (ref 8.9–10.3)
Chloride: 107 mmol/L (ref 98–111)
Creatinine, Ser: 0.98 mg/dL (ref 0.44–1.00)
GFR calc Af Amer: >60 mL/min (ref >60)
GFR calc non Af Amer: 57 mL/min — ABNORMAL LOW (ref >60)
Glucose, Bld: 111 mg/dL — ABNORMAL HIGH (ref 70–99)
Potassium: 3.8 mmol/L (ref 3.5–5.1)
Sodium: 141 mmol/L (ref 135–145)

## 2019-05-21 LAB — CBC
HCT: 26.3 % — ABNORMAL LOW (ref 36.0–46.0)
Hemoglobin: 8.3 g/dL — ABNORMAL LOW (ref 12.0–15.0)
MCH: 28.6 pg (ref 26.0–34.0)
MCHC: 31.6 g/dL (ref 30.0–36.0)
MCV: 90.7 fL (ref 80.0–100.0)
Platelets: 282 10*3/uL (ref 150–400)
RBC: 2.9 MIL/uL — ABNORMAL LOW (ref 3.87–5.11)
RDW: 15.6 % — ABNORMAL HIGH (ref 11.5–15.5)
WBC: 7.8 10*3/uL (ref 4.0–10.5)
nRBC: 0 % (ref 0.0–0.2)

## 2019-05-21 LAB — GLUCOSE, CAPILLARY
Glucose-Capillary: 101 mg/dL — ABNORMAL HIGH (ref 70–99)
Glucose-Capillary: 129 mg/dL — ABNORMAL HIGH (ref 70–99)
Glucose-Capillary: 141 mg/dL — ABNORMAL HIGH (ref 70–99)
Glucose-Capillary: 166 mg/dL — ABNORMAL HIGH (ref 70–99)

## 2019-05-21 MED ORDER — METOPROLOL TARTRATE 12.5 MG HALF TABLET
12.5000 mg | ORAL_TABLET | Freq: Three times a day (TID) | ORAL | Status: DC
  Administered 2019-05-21 – 2019-05-28 (×23): 12.5 mg via ORAL
  Filled 2019-05-21 (×24): qty 1

## 2019-05-21 MED ORDER — METOPROLOL TARTRATE 25 MG/10 ML ORAL SUSPENSION
12.5000 mg | Freq: Three times a day (TID) | ORAL | Status: DC
  Filled 2019-05-21 (×27): qty 5

## 2019-05-21 NOTE — Progress Notes (Signed)
6 Days Post-Op Procedure(s) (LRB): CORONARY ARTERY BYPASS GRAFTING (CABG) times two, left internal mammary artery to left anterior descending artery and left greater saphenous vein to posterior descending artery, left sapheouns vein harvested endoscopically  (N/A) TRANSESOPHAGEAL ECHOCARDIOGRAM (TEE) (N/A) Subjective: Transferred to 4E yesterday. Awake and alert, says she had a good day yesterday. Tolerating PO's, BM last night.   Objective: Vital signs in last 24 hours: Temp:  [97.8 F (36.6 C)-98.2 F (36.8 C)] 98 F (36.7 C) (09/22 0503) Pulse Rate:  [72-80] 76 (09/22 0535) Cardiac Rhythm: Normal sinus rhythm;Bundle branch block (09/21 2000) Resp:  [13-22] 18 (09/22 0503) BP: (103-132)/(46-63) 132/53 (09/22 0503) SpO2:  [96 %-100 %] 100 % (09/22 0535) Weight:  [93.2 kg] 93.2 kg (09/22 0625)    Intake/Output from previous day: 09/21 0701 - 09/22 0700 In: 493.3 [P.O.:460; I.V.:33.3] Out: 1851 [Urine:1850; Stool:1] Intake/Output this shift: No intake/output data recorded.  Physical Exam: General appearance: alert, cooperative and no distress Neurologic: intact Heart: RRR, monitor review shows no further atrial fibrillation. Lungs: Breath sounds are clear Abdomen: soft and not-tender.  Extremities: LE edema slowly improving. The left EVH incision is intact and healing with no sings of complication..  Wound: The Prevena negative pressure dressing was removed from the sternal incision. The incision is intact and dry.   Lab Results: Recent Labs    05/20/19 0408 05/21/19 0254  WBC 7.4 7.8  HGB 8.0* 8.3*  HCT 24.9* 26.3*  PLT 213 282   BMET:  Recent Labs    05/20/19 0408 05/21/19 0254  NA 139 141  K 3.9 3.8  CL 107 107  CO2 25 27  GLUCOSE 112* 111*  BUN 22 23  CREATININE 0.93 0.98  CALCIUM 7.9* 7.8*    PT/INR: No results for input(s): LABPROT, INR in the last 72 hours. ABG    Component Value Date/Time   PHART 7.263 (L) 05/15/2019 2129   HCO3 18.2 (L)  05/15/2019 2129   TCO2 19 (L) 05/15/2019 2129   ACIDBASEDEF 8.0 (H) 05/15/2019 2129   O2SAT 70.4 05/20/2019 0515   CBG (last 3)  Recent Labs    05/20/19 1657 05/20/19 2106 05/21/19 0619  GLUCAP 125* 129* 101*    Assessment/Plan: S/P Procedure(s) (LRB): CORONARY ARTERY BYPASS GRAFTING (CABG) times two, left internal mammary artery to left anterior descending artery and left greater saphenous vein to posterior descending artery, left sapheouns vein harvested endoscopically  (N/A) TRANSESOPHAGEAL ECHOCARDIOGRAM (TEE) (N/A)  -POD6CABG x 2 for CAD presenting with an acute NSTEMI and pre-op EF 30-35%. VS and hemodynamics stable.  Making slow progress with mobility. Continue Plavix due to ASA allergy.Continue PT, OT, rehab  evaluation is ongoing.  -Atrial fibrillation-- IV amiodarone load initiated in the OR9/16/20 and continued for 4 days post-op.Amio discontinued yesterday. She is maintaining SR. Will continue to observe rhythm.  Slowly up-titrate metoprolol. D/C pacer wires.   -Volume excess with L>R pleural effusion.  Good response to IV lasix over past few days but Wt. Is still several Kg>pre-op wt. Continue IV Lasix. CXR pending.  -Chronic kidney disease, stage 3- Stable renal function, K+ OK. Monitor.   -Type 2 diabetes mellitus-Pre-op A1C 6.3. Glucose well controlled ranging 101-129 over the past 24 hours.  Continue SSI.   -Expected acute blood loss anemia--Hctremains stableand no indication for transfusion.  -Thrombocytopenia- Resolved  -GERD- Continue PPI.   -DVT PPX-ContinueLovenox, advance activity.   LOS: 8 days    Antony Odea, [place] [phone] 05/21/2019

## 2019-05-21 NOTE — Progress Notes (Signed)
Physical Therapy Treatment Patient Details Name: Megan [name] MRN: [id number] DOB: 09-10-1945 Today's Date: 05/21/2019    History of Present Illness 73 y.o. female who presented to the ED with c/o chest pain and admitted for NSTEMI. She underwent CABG 05/15/19. PMH consists of recent R ankle fx (12/2018), L knee DJD, OA, HTN, glaucoma, LE cellulitis, DM, depression, anxiety, and asthma.    PT Comments    Pt admitted with above diagnosis. Pt was able to ambulate with Harmon Pier walker on unit with min assist 140 feet.  Pt progressing.  Ready for Rehab.  Did need a chair follow as she does fatigue.   Pt currently with functional limitations due to balance and endurance deficits. Pt will benefit from skilled PT to increase their independence and safety with mobility to allow discharge to the venue listed below.     Follow Up Recommendations  CIR     Equipment Recommendations  Other (comment)(TBD)    Recommendations for Other Services Rehab consult     Precautions / Restrictions Precautions Precautions: Fall;Sternal Precaution Comments: educated on sternal precautions, good recall of precautions without cueing Restrictions Weight Bearing Restrictions: Yes RUE Weight Bearing: Non weight bearing LUE Weight Bearing: Non weight bearing Other Position/Activity Restrictions: sternal precautions     Mobility  Bed Mobility Overal bed mobility: Needs Assistance Bed Mobility: Supine to Sit     Supine to sit: Mod assist     General bed mobility comments: Needed incr assist with use of pad to get pt to EOB with pt needing assist for her LEs and for her trunk.   Transfers Overall transfer level: Needs assistance Equipment used: Harmon Pier walker) Transfers: Sit to/from Stand Sit to Stand: Min assist;+2 safety/equipment         General transfer comment: sit to stand from bed with min assist, cueing throughout session for hand placement technique and safety.  Needed just a little assist with  power up.   Ambulation/Gait Ambulation/Gait assistance: Min assist;+2 safety/equipment Gait Distance (Feet): 140 Feet Assistive device: (Eva walker) Gait Pattern/deviations: Step-through pattern;Decreased stride length;Decreased step length - right;Decreased step length - left;Drifts right/left;Trunk flexed;Wide base of support   Gait velocity interpretation: <1.31 ft/sec, indicative of household ambulator General Gait Details: Pt was able to ambulate with Harmon Pier walker with min assist for steering a times and just steadying assist.  Needed cues to not lean into Eva walker but overall great progess today.  Did follow with chair and pt needed to sit after 140 feet.    Stairs             Wheelchair Mobility    Modified Rankin (Stroke Patients Only)       Balance Overall balance assessment: Needs assistance Sitting-balance support: No upper extremity supported;Feet supported Sitting balance-Leahy Scale: Fair     Standing balance support: Bilateral upper extremity supported;During functional activity Standing balance-Leahy Scale: Poor Standing balance comment: reliant on BUE and external support                            Cognition Arousal/Alertness: Awake/alert Behavior During Therapy: Anxious Overall Cognitive Status: Within Functional Limits for tasks assessed                                 General Comments: husband past away June 2019. Multiple health problems since his passing. SNF following cellulitis early 2020. Discharged to her  sister's house where she fell down the steps May 2020 sustaining R ankle fx. SNF stay following that hospitalization. Returned home August 2020.      Exercises      General Comments General comments (skin integrity, edema, etc.): VSS during session. sister present for session      Pertinent Vitals/Pain Pain Assessment: Faces Faces Pain Scale: Hurts even more Pain Location: chest/incision Pain Descriptors /  Indicators: Grimacing;Guarding Pain Intervention(s): Limited activity within patient's tolerance;Monitored during session;Repositioned    Home Living                      Prior Function            PT Goals (current goals can now be found in the care plan section) Acute Rehab PT Goals Patient Stated Goal: to get stronger and be able to take care of myself Progress towards PT goals: Progressing toward goals    Frequency    Min 3X/week      PT Plan Current plan remains appropriate    Co-evaluation              AM-PAC PT "6 Clicks" Mobility   Outcome Measure  Help needed turning from your back to your side while in a flat bed without using bedrails?: A Lot Help needed moving from lying on your back to sitting on the side of a flat bed without using bedrails?: A Lot Help needed moving to and from a bed to a chair (including a wheelchair)?: A Lot Help needed standing up from a chair using your arms (e.g., wheelchair or bedside chair)?: A Little Help needed to walk in hospital room?: A Little Help needed climbing 3-5 steps with a railing? : Total 6 Click Score: 13    End of Session Equipment Utilized During Treatment: Gait belt Activity Tolerance: Patient limited by pain;Other (comment);Patient limited by fatigue(anxiety) Patient left: with call bell/phone within reach;in chair;with nursing/sitter in room Nurse Communication: Mobility status;Precautions PT Visit Diagnosis: Other abnormalities of gait and mobility (R26.89);Pain;Muscle weakness (generalized) (M62.81)     Time: [id number] PT Time Calculation (min) (ACUTE ONLY): 36 min  Charges:  $Gait Training: 23-37 mins                     [place] Pager:  [phone]  Office:  [place] 05/21/2019, 11:55 AM

## 2019-05-21 NOTE — TOC Initial Note (Signed)
Transition of Care [facility]) - Initial/Assessment Note    Patient Details  Name: Megan [name] MRN: [id number] Date of Birth: May 02, 1946  Transition of Care [facility]) CM/SW Contact:    Vinie Sill, [place] Phone Number: 05/21/2019, 3:46 PM  Clinical Narrative:                 CSW visit with the patient at bedside along with her sister, Lorna Dibble. CSW introduced self and explained role. Patient states she lives alone. Patient recognized the need for rehab and is evaluating CIR verses SNF.   CSW will continue to follow  Thurmond Butts, MSW, [facility] Clinical Social Worker [phone]    Expected Discharge Plan: [place] Barriers to Discharge: Continued Medical Work up   Patient Goals and CMS Choice        Expected Discharge Plan and Services Expected Discharge Plan: [place] arrangements for the past 2 months: [place]                                      Prior Living Arrangements/Services Living arrangements for the past 2 months: Single Family Home Lives with:: Self Patient language and need for interpreter reviewed:: Yes        Need for Family Participation in Patient Care: Yes (Comment) Care giver support system in place?: Yes (comment)   Criminal Activity/Legal Involvement Pertinent to Current Situation/Hospitalization: No - Comment as needed  Activities of Daily Living Home Assistive Devices/Equipment: Eyeglasses, Bedside commode/3-in-1, Cane (specify quad or straight), Walker (specify type) ADL Screening (condition at time of admission) Patient's cognitive ability adequate to safely complete daily activities?: Yes Is the patient deaf or have difficulty hearing?: No Does the patient have difficulty seeing, even when wearing glasses/contacts?: No Does the patient have difficulty concentrating, remembering, or making decisions?: No Patient able to express need for assistance with ADLs?: Yes Does the patient have  difficulty dressing or bathing?: Yes(at home sitter) Independently performs ADLs?: No Communication: Independent Dressing (OT): Needs assistance Is this a change from baseline?: Pre-admission baseline Grooming: Needs assistance Is this a change from baseline?: Pre-admission baseline Feeding: Independent Bathing: Needs assistance Is this a change from baseline?: Pre-admission baseline Toileting: Needs assistance Is this a change from baseline?: Pre-admission baseline In/Out Bed: Needs assistance Is this a change from baseline?: Pre-admission baseline Walks in Home: Independent with device (comment)(walker) Does the patient have difficulty walking or climbing stairs?: Yes Weakness of Legs: Both(generalized weakness) Weakness of Arms/Hands: None  Permission Sought/Granted Permission sought to share information with : Family Supports Permission granted to share information with : Yes, Verbal Permission Granted  Share Information with NAME: Donzetta Sprung     Permission granted to share info w Relationship: sister  Permission granted to share info w Contact Information: [phone]  Emotional Assessment Appearance:: Appears stated age Attitude/Demeanor/Rapport: Engaged Affect (typically observed): Accepting, Appropriate Orientation: : Oriented to Self, Oriented to Place, Oriented to  Time, Oriented to Situation Alcohol / Substance Use: Not Applicable Psych Involvement: No (comment)  Admission diagnosis:  Precordial chest pain [R07.2] LBBB (left bundle branch block) [I44.7] ACS (acute coronary syndrome) [facility]) [I24.9] Patient Active Problem List   Diagnosis Date Noted  . 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  . NSTEMI (non-ST elevated myocardial infarction) ([place]) 05/13/2019  . Nausea 04/02/2019  . Dry eyes 04/02/2019  . Insomnia 03/13/2019  . Hypotension  03/05/2019  . Muscle spasm 02/28/2019  . Osteoarthritis of left knee 02/28/2019  . Gout 02/16/2019  . Hypothyroidism 02/16/2019  .  GERD (gastroesophageal reflux disease) 02/16/2019  . Anxiety 02/16/2019  . Hypokalemia 02/16/2019  . Hyperuricemia 02/04/2019  . Dysphagia 01/16/2019  . Closed right ankle fracture 01/16/2019  . Generalized weakness 01/16/2019  . CKD (chronic kidney disease) stage 3, GFR 30-59 ml/min (HCC) 11/26/2018  . Cellulitis of lower extremity   . Diastolic CHF ([place]) XX123456  . DM (diabetes mellitus), type 2 with complications ([place]) XX123456  . Hypertensive heart disease with CHF (congestive heart failure) ([place]) 09/08/2018  . Leg edema 09/08/2018  . Blood in stool 09/08/2018  . OSA (obstructive sleep apnea) 09/08/2018  . Sensorineural hearing loss (SNHL) of both ears 07/31/2017  . Lumbar radiculopathy 12/05/2016  . Diabetic polyneuropathy associated with diabetes mellitus due to underlying condition ([place]) 07/09/2015  . Vitamin deficiency related neuropathy 07/09/2015  . Hx of adenomatous colonic polyps 12/02/2010   PCP:  [name], MD Pharmacy:   [id number] [place], [place]. [place] [address] Phone: [phone] Fax: [phone]  [place], [place] [facility] [address] [place] [address] [place] [address] Phone: [phone] Fax: [place], [place] [address] [place] [address] [place] [address] Phone: [phone] Fax: [phone]     Social Determinants of Health (SDOH) Interventions    Readmission Risk Interventions No flowsheet data found.

## 2019-05-21 NOTE — Progress Notes (Signed)
Inpatient Rehabilitation-Admissions Coordinator   [facility] met with pt and her sister again at the bedside. I reviewed impressive progress with PT this AM and pt very proud of herself. I feel she would be a great candidate for CIR but do anticipate only Supervision goals at DC. As pt does not have any assistance for the majority of the day, we have decided SNF would be most appropriate to give her a chance to reach [place] prior to returning home. Pt and her sister in agreement with SNF.   I have contacted CM/SW regarding recommendation.   AC will sign off.   Please call if questions.   Jhonnie Garner, OTR/L  Rehab Admissions Coordinator  [phone] 05/21/2019 4:26 PM

## 2019-05-21 NOTE — TOC Progression Note (Addendum)
Transition of Care [facility]) - Progression Note    Patient Details  Name: Megan [name] MRN: [id number] Date of Birth: [birth year]  Transition of Care [facility]) CM/SW [place], [place] Phone Number: 05/21/2019, 4:41 PM  Clinical Narrative:     Patient informed CSW she has decided to go to SNF. CSW provided patient and sister with Medicare.gov listing for SNF  Choice. CSW will follow up tomorrow.  PSARR pending   Thurmond Butts, MSW, [facility] Clinical Social Worker [phone]    Expected Discharge Plan: [place] Barriers to Discharge: Continued Medical Work up  Expected Discharge Plan and Services Expected Discharge Plan: [place]       Living arrangements for the past 2 months: Single Family Home                                       Social Determinants of Health (SDOH) Interventions    Readmission Risk Interventions No flowsheet data found.

## 2019-05-21 NOTE — Progress Notes (Signed)
CARDIAC REHAB PHASE I   Helped PT walk with pt earlier. Pt impressed by the distance she was able to walk. Encouraged continued ambulation and IS use. Will f/u tomorrow.  Rufina Falco, RN BSN 05/21/2019 3:18 PM

## 2019-05-21 NOTE — Plan of Care (Signed)
Poc progressing.  

## 2019-05-21 NOTE — Progress Notes (Signed)
Pt was sitting ion bedside recliner ,most of the day. When transferring pt to bed from recliner, pt was very tearful, anxious and  kept saying " I am stuck to the chair." it took 3 person with a walker and gait belt to transfer patient. Was in pain and too weak to walk. Will continue to monitor.

## 2019-05-21 NOTE — Care Management Important Message (Signed)
Important Message  Patient Details  Name: MONI WHILEY MRN: [id number] Date of Birth: [birth year]   Medicare Important Message Given:  Yes     Shelda Altes 05/21/2019, 2:49 PM

## 2019-05-21 NOTE — Progress Notes (Signed)
EPWs pulled per order.  Pt tolerated very well, sites unremarkable, all tips intact.  Pt understands bedrest for one hr with frequent VS.  CCMD notified, will monitor closely

## 2019-05-21 NOTE — Progress Notes (Addendum)
Patient sleeping and having brief periods of sats. down to 84 % on Rm. Air with good wave form. Applied oO2 at 2 l/m n.c..

## 2019-05-21 NOTE — NC FL2 (Signed)
[place] LEVEL OF CARE SCREENING TOOL     IDENTIFICATION  Patient Name: Megan [name] Birthdate: 1946-01-09 Sex: female Admission Date (Current Location): 05/13/2019  [facility] and [place] Number:  Herbalist and Address:  [facility] [place], [place] [address], [place], [place] [address]      Provider Number: [id number]  Attending Physician Name and Address:  Grace Isaac, MD  Relative Name and Phone Number:  Lorna Dibble [phone]    Current Level of Care: Hospital Recommended Level of Care: [place] Prior Approval Number:    Date Approved/Denied:   PASRR Number: (Pending)  Discharge Plan: SNF    Current Diagnoses: Patient Active Problem List   Diagnosis Date Noted  . 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  . NSTEMI (non-ST elevated myocardial infarction) ([place]) 05/13/2019  . Nausea 04/02/2019  . Dry eyes 04/02/2019  . Insomnia 03/13/2019  . Hypotension 03/05/2019  . Muscle spasm 02/28/2019  . Osteoarthritis of left knee 02/28/2019  . Gout 02/16/2019  . Hypothyroidism 02/16/2019  . GERD (gastroesophageal reflux disease) 02/16/2019  . Anxiety 02/16/2019  . Hypokalemia 02/16/2019  . Hyperuricemia 02/04/2019  . Dysphagia 01/16/2019  . Closed right ankle fracture 01/16/2019  . Generalized weakness 01/16/2019  . CKD (chronic kidney disease) stage 3, GFR 30-59 ml/min (HCC) 11/26/2018  . Cellulitis of lower extremity   . Diastolic CHF ([place]) XX123456  . DM (diabetes mellitus), type 2 with complications ([place]) XX123456  . Hypertensive heart disease with CHF (congestive heart failure) ([place]) 09/08/2018  . Leg edema 09/08/2018  . Blood in stool 09/08/2018  . OSA (obstructive sleep apnea) 09/08/2018  . Sensorineural hearing loss (SNHL) of both ears 07/31/2017  . Lumbar radiculopathy 12/05/2016  . Diabetic polyneuropathy associated with diabetes mellitus due to underlying condition ([place]) 07/09/2015  . Vitamin  deficiency related neuropathy 07/09/2015  . Hx of adenomatous colonic polyps 12/02/2010    Orientation RESPIRATION BLADDER Height & Weight     Self, Time, Situation, Place  O2(nasal cannula 2L/min) Incontinent, External catheter Weight: 205 lb 7.5 oz (93.2 kg) Height:  5\' 3"  (160 cm)  BEHAVIORAL SYMPTOMS/MOOD NEUROLOGICAL BOWEL NUTRITION STATUS      Incontinent Diet(see discharge summary)  AMBULATORY STATUS COMMUNICATION OF NEEDS Skin   Extensive Assist Verbally Other (Comment)(closed surgical incisions on chest and left leg)                       Personal Care Assistance Level of Assistance  Bathing, Dressing, Total care, Feeding Bathing Assistance: Limited assistance Feeding assistance: Limited assistance Dressing Assistance: Maximum assistance Total Care Assistance: Maximum assistance   Functional Limitations Info  Sight, Hearing, Speech Sight Info: Impaired Hearing Info: Impaired Speech Info: Adequate    SPECIAL CARE FACTORS FREQUENCY  PT (By licensed PT), OT (By licensed OT)     PT Frequency: min 5x weekly OT Frequency: min 5x weekly            Contractures Contractures Info: Not present    Additional Factors Info  Code Status, Allergies Code Status Info: full Allergies Info: Aspirin, Bee Pollen, Codeine, Fish oil, sulfa antibiotics, iron, camhor, lisinopril, penicillins, sulfasalazine, vicodin (hydrocodone-acetaminophn)           Current Medications (05/21/2019):  This is the current hospital active medication list Current Facility-Administered Medications  Medication Dose Route Frequency Provider Last Rate Last Dose  . 0.9 %  sodium chloride infusion  250 mL Intravenous PRN Antony Odea,  PA-C      . acetaminophen (TYLENOL) tablet 650 mg  650 mg Oral Q4H PRN Antony Odea, PA-C   650 mg at 05/21/19 [id number]  . atorvastatin (LIPITOR) tablet 80 mg  80 mg Oral q1800 Antony Odea, PA-C   80 mg at 05/20/19 1730  . bisacodyl (DULCOLAX) EC  tablet 10 mg  10 mg Oral Daily Antony Odea, PA-C   10 mg at 05/20/19 [id number]   Or  . bisacodyl (DULCOLAX) suppository 10 mg  10 mg Rectal Daily Antony Odea, PA-C   10 mg at 05/20/19 1244  . Chlorhexidine Gluconate Cloth 2 % PADS 6 each  6 each Topical Daily Antony Odea, PA-C   6 each at 05/20/19 1000  . clopidogrel (PLAVIX) tablet 75 mg  75 mg Oral Daily Antony Odea, PA-C   75 mg at 05/21/19 1043  . docusate sodium (COLACE) capsule 200 mg  200 mg Oral Daily Antony Odea, PA-C   200 mg at 05/20/19 0905  . enoxaparin (LOVENOX) injection 30 mg  30 mg Subcutaneous QHS Roddenberry, Myron G, PA-C   30 mg at 05/20/19 2138  . furosemide (LASIX) injection 40 mg  40 mg Intravenous Q12H Antony Odea, PA-C   40 mg at 05/21/19 0851  . insulin aspart (novoLOG) injection 0-24 Units  0-24 Units Subcutaneous TID AC & HS Antony Odea, PA-C   2 Units at 05/20/19 2138  . levothyroxine (SYNTHROID) tablet 50 mcg  50 mcg Oral Q0600 Antony Odea, PA-C   50 mcg at 05/21/19 0533  . metoprolol tartrate (LOPRESSOR) tablet 12.5 mg  12.5 mg Oral TID Roddenberry, Myron G, PA-C   12.5 mg at 05/21/19 1043   Or  . metoprolol tartrate (LOPRESSOR) 25 mg/10 mL oral suspension 12.5 mg  12.5 mg Per Tube TID Roddenberry, Myron G, PA-C      . moving right along book   Does not apply Once Roddenberry, Myron G, PA-C      . ondansetron [facility]) injection 4 mg  4 mg Intravenous Q6H PRN Roddenberry, Myron G, PA-C   4 mg at 05/18/19 1155  . oxyCODONE (Oxy IR/ROXICODONE) immediate release tablet 5-10 mg  5-10 mg Oral Q3H PRN Antony Odea, PA-C   10 mg at 05/17/19 2132  . pantoprazole (PROTONIX) EC tablet 40 mg  40 mg Oral Daily Roddenberry, Myron G, PA-C   40 mg at 05/21/19 1043  . potassium chloride SA (K-DUR) CR tablet 20 mEq  20 mEq Oral BID Antony Odea, PA-C   20 mEq at 05/21/19 1043  . sodium chloride flush (NS) 0.9 % injection 10-40 mL  10-40 mL Intracatheter  Q12H Roddenberry, Myron G, PA-C   20 mL at 05/20/19 2000  . sodium chloride flush (NS) 0.9 % injection 3 mL  3 mL Intravenous Q12H Roddenberry, Myron G, PA-C   3 mL at 05/21/19 1049  . sodium chloride flush (NS) 0.9 % injection 3 mL  3 mL Intravenous PRN Roddenberry, Myron G, PA-C      . traMADol Veatrice Bourbon) tablet 50-100 mg  50-100 mg Oral Q4H PRN Roddenberry, Myron G, PA-C   50 mg at 05/20/19 1359     Discharge Medications: Please see discharge summary for a list of discharge medications.  Relevant Imaging Results:  Relevant Lab Results:   Additional Information SSN: [id number]  Alberteen Sam, LCSW

## 2019-05-22 LAB — GLUCOSE, CAPILLARY
Glucose-Capillary: 122 mg/dL — ABNORMAL HIGH (ref 70–99)
Glucose-Capillary: 163 mg/dL — ABNORMAL HIGH (ref 70–99)
Glucose-Capillary: 103 mg/dL — ABNORMAL HIGH (ref 70–99)
Glucose-Capillary: 125 mg/dL — ABNORMAL HIGH (ref 70–99)

## 2019-05-22 MED ORDER — METFORMIN HCL 500 MG PO TABS
500.0000 mg | ORAL_TABLET | Freq: Two times a day (BID) | ORAL | Status: DC
  Administered 2019-05-22 – 2019-05-29 (×15): 500 mg via ORAL
  Filled 2019-05-22 (×15): qty 1

## 2019-05-22 NOTE — Progress Notes (Addendum)
7 Days Post-Op Procedure(s) (LRB): CORONARY ARTERY BYPASS GRAFTING (CABG) times two, left internal mammary artery to left anterior descending artery and left greater saphenous vein to posterior descending artery, left sapheouns vein harvested endoscopically  (N/A) TRANSESOPHAGEAL ECHOCARDIOGRAM (TEE) (N/A) Subjective: Had a good session with PT yesterday then became very discouraged later in the day when she could not get up from the bedside chair without several assistants.  She has decided on a SNF as her choice for further rehab following discharge.   Objective: Vital signs in last 24 hours: Temp:  [97.7 F (36.5 C)-99.2 F (37.3 C)] 97.8 F (36.6 C) (09/23 0400) Pulse Rate:  [81-93] 84 (09/23 0500) Cardiac Rhythm: Normal sinus rhythm (09/23 0400) Resp:  [17-24] 20 (09/23 0400) BP: (93-125)/(45-66) 100/47 (09/23 0400) SpO2:  [95 %-100 %] 97 % (09/23 0500) Weight:  [91.8 kg] 91.8 kg (09/23 0500)  Intake/Output from previous day: 09/22 0701 - 09/23 0700 In: 340 [P.O.:340] Out: 2300 [Urine:2300] Intake/Output this shift: No intake/output data recorded.  Physical Exam: General appearance:alert, cooperative and no distress Neurologic:intact Heart:RRR, monitor review shows SR with a few PAC's and no further atrial fibrillation. Lungs:Breath sounds are clear Abdomen:soft and not-tender. Extremities:LE edema slowly improving. The left EVH incision is intact and healing with mild tenderness posterior to the incision. No erythema or drainage. Wound:the sternal incision is intact and dry.   Lab Results: Recent Labs    05/20/19 0408 05/21/19 0254  WBC 7.4 7.8  HGB 8.0* 8.3*  HCT 24.9* 26.3*  PLT 213 282   BMET:  Recent Labs    05/20/19 0408 05/21/19 0254  NA 139 141  K 3.9 3.8  CL 107 107  CO2 25 27  GLUCOSE 112* 111*  BUN 22 23  CREATININE 0.93 0.98  CALCIUM 7.9* 7.8*    PT/INR: No results for input(s): LABPROT, INR in the last 72 hours. ABG     Component Value Date/Time   PHART 7.263 (L) 05/15/2019 2129   HCO3 18.2 (L) 05/15/2019 2129   TCO2 19 (L) 05/15/2019 2129   ACIDBASEDEF 8.0 (H) 05/15/2019 2129   O2SAT 70.4 05/20/2019 0515   CBG (last 3)  Recent Labs    05/21/19 1639 05/21/19 2046 05/22/19 0623  GLUCAP 141* 166* 122*    CLINICAL DATA:  Postop check  EXAM: CHEST - 2 VIEW 05/21/19  COMPARISON:  Yesterday  FINDINGS: Cardiomegaly with CABG. Small pleural effusions. There is still dense retrocardiac opacity with volume loss. Chronic interstitial coarsening without Kerley lines. No visible pneumothorax. The sheath has been removed.  IMPRESSION: Small pleural effusions and presumed left lower lobe atelectasis. Aeration is mildly improved from yesterday.   Electronically Signed   By: Monte Fantasia M.D.   On: 05/21/2019 09:44   Assessment/Plan: S/P Procedure(s) (LRB): CORONARY ARTERY BYPASS GRAFTING (CABG) times two, left internal mammary artery to left anterior descending artery and left greater saphenous vein to posterior descending artery, left sapheouns vein harvested endoscopically  (N/A) TRANSESOPHAGEAL ECHOCARDIOGRAM (TEE) (N/A)  -POD7CABG x 2 for CAD presenting with an acute NSTEMI and pre-op EF 30-35%. VS and hemodynamics stable. Making slow progress with mobility. ContinuePlavix due to ASA allergy.Continue PT, OT.  She has elected to proceed with SNF placement for further rehab prior to planned return to independent living at home.   -Atrial fibrillation-- IV amiodarone load initiated in the OR9/16/20 and continued for 4 days post-op.Amio discontinued 9/21. She is maintaining SR. Will continue to observe rhythm.Continue metoprolol. Pacer wires removed.  -Volume excess --  Good response to IV lasix over past few days with documented Wt down ~5kg over past 48 hours.  Convert to PO Lasix and KCl.  Recheck BMP in AM.   -Chronic kidney disease, stage 3- Stable renal function. Monitor.    -Type 2 diabetes mellitus-Pre-op A1C 6.3. Glucose well controlled ranging 120-160 over the past 24 hours.  Continue SSI. Resume metformin 500mg  po BID.  -Expected acute blood loss anemia--Hctremainsstableand no indication for transfusion.  -Thrombocytopenia- Resolved  -GERD- Continue PPI.   -DVT PPX-ContinueLovenox, advance activity.   LOS: 9 days    Antony Odea, PA-C [phone] 05/22/2019 I have seen and examined Megan [name] and agree with the above assessment  and plan.  Grace Isaac MD [place] [phone] Office [phone] 05/22/2019 8:02 PM

## 2019-05-22 NOTE — Progress Notes (Signed)
CARDIAC REHAB PHASE I   PRE:  Rate/Rhythm: 93 SR  BP:  Supine: 101/48  Sitting:   Standing:    SaO2: 95%RA  MODE:  Ambulation: 200 ft   POST:  Rate/Rhythm: 104 ST  BP:  Supine:   Sitting: 112/60  Standing:    SaO2: 94%RA 1029-1107 Pt willing to walk and wants to do what she needs to get better. Pt walked 200 ft with EVA on RA with asst x 2. Sat once to rest due to being tired. Asst x1 with gait belt and to walk and one to follow with recliner. Pt tires easily and quickly. Encouraged IS and to sit up for lunch. Encouraged two more walks.   Graylon Good, RN BSN  05/22/2019 11:04 AM

## 2019-05-23 LAB — CBC
HCT: 24.5 % — ABNORMAL LOW (ref 36.0–46.0)
Hemoglobin: 7.9 g/dL — ABNORMAL LOW (ref 12.0–15.0)
MCH: 28.8 pg (ref 26.0–34.0)
MCHC: 32.2 g/dL (ref 30.0–36.0)
MCV: 89.4 fL (ref 80.0–100.0)
Platelets: 331 10*3/uL (ref 150–400)
RBC: 2.74 MIL/uL — ABNORMAL LOW (ref 3.87–5.11)
RDW: 15.6 % — ABNORMAL HIGH (ref 11.5–15.5)
WBC: 7.5 10*3/uL (ref 4.0–10.5)
nRBC: 0 % (ref 0.0–0.2)

## 2019-05-23 LAB — GLUCOSE, CAPILLARY
Glucose-Capillary: 116 mg/dL — ABNORMAL HIGH (ref 70–99)
Glucose-Capillary: 118 mg/dL — ABNORMAL HIGH (ref 70–99)
Glucose-Capillary: 105 mg/dL — ABNORMAL HIGH (ref 70–99)
Glucose-Capillary: 126 mg/dL — ABNORMAL HIGH (ref 70–99)

## 2019-05-23 LAB — BASIC METABOLIC PANEL
Anion gap: 11 (ref 5–15)
BUN: 19 mg/dL (ref 8–23)
CO2: 24 mmol/L (ref 22–32)
Calcium: 7.7 mg/dL — ABNORMAL LOW (ref 8.9–10.3)
Chloride: 104 mmol/L (ref 98–111)
Creatinine, Ser: 0.94 mg/dL (ref 0.44–1.00)
GFR calc Af Amer: >60 mL/min (ref >60)
GFR calc non Af Amer: >60 mL/min (ref >60)
Glucose, Bld: 119 mg/dL — ABNORMAL HIGH (ref 70–99)
Potassium: 3.8 mmol/L (ref 3.5–5.1)
Sodium: 139 mmol/L (ref 135–145)

## 2019-05-23 MED ORDER — FUROSEMIDE 40 MG PO TABS
40.0000 mg | ORAL_TABLET | Freq: Two times a day (BID) | ORAL | Status: DC
  Administered 2019-05-23 – 2019-05-25 (×5): 40 mg via ORAL
  Filled 2019-05-23 (×5): qty 1

## 2019-05-23 NOTE — Progress Notes (Signed)
CARDIAC REHAB PHASE I   PRE:  Rate/Rhythm: 82 SR  BP:  Supine: 136/50  Sitting:   Standing:    SaO2: 92%RA  MODE:  Ambulation: 220 ft   POST:  Rate/Rhythm: 103 ST  BP:  Supine:   Sitting: 137/69  Standing:    SaO2: 94%RA 0757-0830 Pt very motivated and wanted to get OOB. Pt was lying in stool but unaware. Helped pt get cleaned up. Pt walked 220 ft on RA with EVA, gait belt use, and asst x1.  One asst to follow with recliner. Pt did not need to sit. Took a couple of standing rest breaks. Pt still needs asst x 2 to stand. We got pt to rock to stand. Needs assistance also to get to edge of bed. Set up breakfast and pt with call bell. Pt motivated to get more walks in today.   Graylon Good, RN BSN  05/23/2019 8:25 AM

## 2019-05-23 NOTE — Progress Notes (Addendum)
8 Days Post-Op Procedure(s) (LRB): CORONARY ARTERY BYPASS GRAFTING (CABG) times two, left internal mammary artery to left anterior descending artery and left greater saphenous vein to posterior descending artery, left sapheouns vein harvested endoscopically  (N/A) TRANSESOPHAGEAL ECHOCARDIOGRAM (TEE) (N/A) Subjective: Feeling stronger today.  She is eager to advance her activity level.  No decision has been made regarding her choice of SNF.    Objective: Vital signs in last 24 hours: Temp:  [97.7 F (36.5 C)-98.4 F (36.9 C)] 98.4 F (36.9 C) (09/24 0539) Pulse Rate:  [73-92] 78 (09/24 0539) Cardiac Rhythm: Normal sinus rhythm (09/24 0539) Resp:  [17-19] 17 (09/23 1615) BP: (90-144)/(41-61) 109/48 (09/24 0539) SpO2:  [88 %-96 %] 95 % (09/24 0539) Weight:  [90.6 kg] 90.6 kg (09/24 0539)    Intake/Output from previous day: 09/23 0701 - 09/24 0700 In: 610 [P.O.:610] Out: 1001 [Urine:1001] Intake/Output this shift: No intake/output data recorded.  Physical Exam: General appearance:alert, cooperative and no distress Neurologic:intact Heart:RRR, monitor review shows SR with a few PAC's and no further atrial fibrillation. Lungs:Breath sounds are clear Abdomen:soft and not-tender. Extremities:LEedema continues to improve. The left EVH incision is intact and healing with less tenderness today.  No erythema or drainage. Wound:the sternal incision is intact and dry.  Lab Results: Recent Labs    05/21/19 0254 05/23/19 0258  WBC 7.8 7.5  HGB 8.3* 7.9*  HCT 26.3* 24.5*  PLT 282 331   BMET:  Recent Labs    05/21/19 0254 05/23/19 0258  NA 141 139  K 3.8 3.8  CL 107 104  CO2 27 24  GLUCOSE 111* 119*  BUN 23 19  CREATININE 0.98 0.94  CALCIUM 7.8* 7.7*    PT/INR: No results for input(s): LABPROT, INR in the last 72 hours. ABG    Component Value Date/Time   PHART 7.263 (L) 05/15/2019 2129   HCO3 18.2 (L) 05/15/2019 2129   TCO2 19 (L) 05/15/2019 2129   ACIDBASEDEF 8.0 (H) 05/15/2019 2129   O2SAT 70.4 05/20/2019 0515   CBG (last 3)  Recent Labs    05/22/19 1617 05/22/19 2034 05/23/19 0613  GLUCAP 103* 125* 116*    Assessment/Plan: S/P Procedure(s) (LRB): CORONARY ARTERY BYPASS GRAFTING (CABG) times two, left internal mammary artery to left anterior descending artery and left greater saphenous vein to posterior descending artery, left sapheouns vein harvested endoscopically  (N/A) TRANSESOPHAGEAL ECHOCARDIOGRAM (TEE) (N/A)  -POD8CABG x 2 for CAD presenting with an acute NSTEMI and pre-op EF 30-35%. VS and hemodynamics stable.Making slow progress with mobility. ContinuePlavix due to ASA allergy.Continue PT, OT.  She has elected to proceed with SNF placement for further rehab prior to planned return to independent living at home. Anticipate she will be medically stable for discharge tomorrow.  -Atrial fibrillation-- IV amiodarone load initiated in the OR9/16/20and continued for 4 days post-op.Amio discontinued 9/21. She is maintaining SR.Will continuetoobserve rhythm.Continue metoprolol.  -Volume excess--improving, continue Lasix PO.  Recheck BMP in AM.   -Chronic kidney disease, stage 3- Stable renal function. Monitor.   -Type 2 diabetes mellitus-Pre-op A1C 6.3. Glucose well controlled. ContinueSSI and  metformin 500mg  po BID.  -Expected acute blood loss anemia--Hctremainsstableand no indication for transfusion.  -Thrombocytopenia-Resolved  -GERD- Continue PPI.     LOS: 10 days    Malon Kindle [id number] 05/23/2019  Plan transfer to snf tomorrow I have seen and examined Megan [name] and agree with the above assessment  and plan.  Grace Isaac MD Beeper [phone] Office [phone] 05/23/2019  11:53 AM

## 2019-05-24 LAB — BASIC METABOLIC PANEL
Anion gap: 9 (ref 5–15)
BUN: 21 mg/dL (ref 8–23)
CO2: 26 mmol/L (ref 22–32)
Calcium: 7.4 mg/dL — ABNORMAL LOW (ref 8.9–10.3)
Chloride: 106 mmol/L (ref 98–111)
Creatinine, Ser: 0.97 mg/dL (ref 0.44–1.00)
GFR calc Af Amer: >60 mL/min (ref >60)
GFR calc non Af Amer: 58 mL/min — ABNORMAL LOW (ref >60)
Glucose, Bld: 106 mg/dL — ABNORMAL HIGH (ref 70–99)
Potassium: 4.1 mmol/L (ref 3.5–5.1)
Sodium: 141 mmol/L (ref 135–145)

## 2019-05-24 LAB — CBC
HCT: 23.3 % — ABNORMAL LOW (ref 36.0–46.0)
Hemoglobin: 7.5 g/dL — ABNORMAL LOW (ref 12.0–15.0)
MCH: 29.1 pg (ref 26.0–34.0)
MCHC: 32.2 g/dL (ref 30.0–36.0)
MCV: 90.3 fL (ref 80.0–100.0)
Platelets: 348 10*3/uL (ref 150–400)
RBC: 2.58 MIL/uL — ABNORMAL LOW (ref 3.87–5.11)
RDW: 15.6 % — ABNORMAL HIGH (ref 11.5–15.5)
WBC: 7.3 10*3/uL (ref 4.0–10.5)
nRBC: 0 % (ref 0.0–0.2)

## 2019-05-24 LAB — GLUCOSE, CAPILLARY
Glucose-Capillary: 101 mg/dL — ABNORMAL HIGH (ref 70–99)
Glucose-Capillary: 96 mg/dL (ref 70–99)
Glucose-Capillary: 112 mg/dL — ABNORMAL HIGH (ref 70–99)
Glucose-Capillary: 119 mg/dL — ABNORMAL HIGH (ref 70–99)

## 2019-05-24 NOTE — Care Management Important Message (Signed)
Important Message  Patient Details  Name: [name] MRN: [id number] Date of Birth: Mar 17, 1946   Medicare Important Message Given:  Yes     Shelda Altes 05/24/2019, 11:23 AM

## 2019-05-24 NOTE — Progress Notes (Signed)
Pt is a deep sleep mouth breather at night, her lowest SPO2 66-84%. O2 NCL 3 lpm given for support to maintain her SPO2 > 92-100%. Will continue to monitor.   Kennyth Lose, RN

## 2019-05-24 NOTE — Progress Notes (Signed)
PT Cancellation Note  Patient Details Name: Megan [name] MRN: [id number] DOB: 1946-04-02   Cancelled Treatment:    Reason Eval/Treat Not Completed: Other (comment)(Pt walking with Cardiac Rehab on arrival.  )   Denice Paradise 05/24/2019, 2:44 PM Kamaile Zachow,PT Acute Rehabilitation Services Pager:  [phone]  Office:  [phone]

## 2019-05-24 NOTE — Progress Notes (Signed)
CARDIAC REHAB PHASE I   PRE:  Rate/Rhythm: 93 SR  BP:  Supine:   Sitting: 122/56  Standing:    SaO2: 97%RA  MODE:  Ambulation: 112 ft   POST:  Rate/Rhythm: 107 ST  BP:  Supine:   Sitting: 123/49  Standing:    SaO2: 96 -100%RA 1404-1443 Pt more anxious without use of EVA. Since pt to go to SNF, wanted to use rolling walker to see how pt tolerates. Tried rollator to begin but pt stated it moved too quickly. Pt walked with rolling walker, gait belt use, asst x 2 and followed with rollator when she needed to sit. Pt given much encouragement. Sat twice to rest. Pt needed asst x 2 to stand even with rocking. Walked 112 ft on RA , checked sats in hallway too.  To bed after walk. Pt tired by end of walk.    Graylon Good, RN BSN  05/24/2019 2:39 PM

## 2019-05-24 NOTE — Progress Notes (Addendum)
9 Days Post-Op Procedure(s) (LRB): CORONARY ARTERY BYPASS GRAFTING (CABG) times two, left internal mammary artery to left anterior descending artery and left greater saphenous vein to posterior descending artery, left sapheouns vein harvested endoscopically  (N/A) TRANSESOPHAGEAL ECHOCARDIOGRAM (TEE) (N/A) Subjective: Awake and alert, C/O soreness in her sternal incision and low back.  Thinks she set up too long yesterday.  She has selected [facility] and is awaiting a decision from CM/SW regarding bed availability.   Objective: Vital signs in last 24 hours: Temp:  [97.7 F (36.5 C)-98.5 F (36.9 C)] 97.8 F (36.6 C) (09/25 0417) Pulse Rate:  [73-88] 79 (09/25 0730) Cardiac Rhythm: Normal sinus rhythm;Bundle branch block (09/25 0505) Resp:  [16-20] 18 (09/25 0417) BP: (92-113)/(41-64) 113/47 (09/25 0417) SpO2:  [66 %-100 %] 97 % (09/25 0730) Weight:  [89.5 kg] 89.5 kg (09/25 0417)     Intake/Output from previous day: 09/24 0701 - 09/25 0700 In: 730 [P.O.:730] Out: 452 [Urine:451; Stool:1] Intake/Output this shift: No intake/output data recorded.  Physical Exam: General appearance:alert, cooperative and mild distress Neurologic:intact Heart:RRR, monitor review showsSR with a few PAC's Lungs:Breath sounds are clear Abdomen:soft and not-tender. Extremities:LEedema continues to improve. The left EVH incision is intact and healing. Tenderness has resolved.  No erythema or drainage. Wound:the sternal incision is intact and dry. The sternum is stable  Lab Results: Recent Labs    05/23/19 0258 05/24/19 0257  WBC 7.5 7.3  HGB 7.9* 7.5*  HCT 24.5* 23.3*  PLT 331 348   BMET:  Recent Labs    05/23/19 0258 05/24/19 0257  NA 139 141  K 3.8 4.1  CL 104 106  CO2 24 26  GLUCOSE 119* 106*  BUN 19 21  CREATININE 0.94 0.97  CALCIUM 7.7* 7.4*    PT/INR: No results for input(s): LABPROT, INR in the last 72 hours. ABG    Component Value Date/Time   PHART 7.263  (L) 05/15/2019 2129   HCO3 18.2 (L) 05/15/2019 2129   TCO2 19 (L) 05/15/2019 2129   ACIDBASEDEF 8.0 (H) 05/15/2019 2129   O2SAT 70.4 05/20/2019 0515   CBG (last 3)  Recent Labs    05/23/19 1637 05/23/19 2107 05/24/19 0610  GLUCAP 105* 126* 101*    Assessment/Plan: S/P Procedure(s) (LRB): CORONARY ARTERY BYPASS GRAFTING (CABG) times two, left internal mammary artery to left anterior descending artery and left greater saphenous vein to posterior descending artery, left sapheouns vein harvested endoscopically  (N/A) TRANSESOPHAGEAL ECHOCARDIOGRAM (TEE) (N/A)  -POD9CABG x 2 for CAD presenting with an acute NSTEMI and pre-op EF 30-35%. VS and hemodynamics stable.Making slow progress with mobility. ContinuePlavix due to ASA allergy.Continue PT, OT. She has elected to proceed with SNF placement for further rehab prior to planned return to independent living at home. She is medically stable for discharge. Awaiting bed availability.  -Atrial fibrillation-- IV amiodarone load initiated in the OR9/16/20and continued for 4 days post-op.Amio discontinued9/21. She is maintaining SR.Will continuetoobserve rhythm.Continuemetoprolol.  -Volume excess--improving, continue Lasix PO. Recheck BMP in AM.   -Chronic kidney disease, stage 3- Stable renal function. Monitor.   -Type 2 diabetes mellitus-Pre-op A1C 6.3. Glucose well controlled. ContinueSSI and  metformin 500mg  po BID.  -Expected acute blood loss anemia--Hct slowly drifting down. Add Trinsicon.  -Thrombocytopenia-Resolved  -GERD- Continue PPI.   -DVT PPX-Continue enoxaparin.     LOS: 11 days    Antony Odea , PA-C [phone] 05/24/2019  I have seen and examined Megan [name] and agree with the above assessment  and  plan.  Grace Isaac MD Beeper [phone] Office [phone] 05/24/2019 12:41 PM

## 2019-05-25 LAB — CBC
HCT: 24.4 % — ABNORMAL LOW (ref 36.0–46.0)
Hemoglobin: 7.5 g/dL — ABNORMAL LOW (ref 12.0–15.0)
MCH: 28.4 pg (ref 26.0–34.0)
MCHC: 30.7 g/dL (ref 30.0–36.0)
MCV: 92.4 fL (ref 80.0–100.0)
Platelets: 402 10*3/uL — ABNORMAL HIGH (ref 150–400)
RBC: 2.64 MIL/uL — ABNORMAL LOW (ref 3.87–5.11)
RDW: 15.6 % — ABNORMAL HIGH (ref 11.5–15.5)
WBC: 8.9 10*3/uL (ref 4.0–10.5)
nRBC: 0 % (ref 0.0–0.2)

## 2019-05-25 LAB — BASIC METABOLIC PANEL
Anion gap: 9 (ref 5–15)
BUN: 19 mg/dL (ref 8–23)
CO2: 27 mmol/L (ref 22–32)
Calcium: 7.6 mg/dL — ABNORMAL LOW (ref 8.9–10.3)
Chloride: 105 mmol/L (ref 98–111)
Creatinine, Ser: 1 mg/dL (ref 0.44–1.00)
GFR calc Af Amer: >60 mL/min (ref >60)
GFR calc non Af Amer: 56 mL/min — ABNORMAL LOW (ref >60)
Glucose, Bld: 134 mg/dL — ABNORMAL HIGH (ref 70–99)
Potassium: 4.5 mmol/L (ref 3.5–5.1)
Sodium: 141 mmol/L (ref 135–145)

## 2019-05-25 LAB — GLUCOSE, CAPILLARY
Glucose-Capillary: 90 mg/dL (ref 70–99)
Glucose-Capillary: 69 mg/dL — ABNORMAL LOW (ref 70–99)
Glucose-Capillary: 91 mg/dL (ref 70–99)
Glucose-Capillary: 83 mg/dL (ref 70–99)

## 2019-05-25 LAB — NOVEL CORONAVIRUS, NAA (HOSP ORDER, SEND-OUT TO REF LAB; TAT 18-24 HRS): SARS-CoV-2, NAA: NOT DETECTED

## 2019-05-25 MED ORDER — FUROSEMIDE 40 MG PO TABS
40.0000 mg | ORAL_TABLET | Freq: Every day | ORAL | Status: DC
  Administered 2019-05-26 – 2019-05-29 (×4): 40 mg via ORAL
  Filled 2019-05-25 (×4): qty 1

## 2019-05-25 NOTE — Progress Notes (Signed)
Walked with standard walker to nurses station and back. Independent after set-up, tolerated well. To chair for dinner.

## 2019-05-25 NOTE — Progress Notes (Addendum)
[address]       [place] [address]             [phone]      10 Days Post-Op Procedure(s) (LRB): CORONARY ARTERY BYPASS GRAFTING (CABG) times two, left internal mammary artery to left anterior descending artery and left greater saphenous vein to posterior descending artery, left sapheouns vein harvested endoscopically  (N/A) TRANSESOPHAGEAL ECHOCARDIOGRAM (TEE) (N/A) Subjective: No new issues  Objective: Vital signs in last 24 hours: Temp:  [97.8 F (36.6 C)-98.5 F (36.9 C)] 98.5 F (36.9 C) (09/26 0818) Pulse Rate:  [81-93] 81 (09/26 0019) Cardiac Rhythm: Normal sinus rhythm (09/26 0700) Resp:  [18] 18 (09/25 1645) BP: (106-116)/(42-58) 107/58 (09/26 0818) SpO2:  [94 %-100 %] 96 % (09/26 0818) Weight:  [90.9 kg] 90.9 kg (09/26 0547)  Hemodynamic parameters for last 24 hours:    Intake/Output from previous day: 09/25 0701 - 09/26 0700 In: 240 [P.O.:240] Out: 400 [Urine:400] Intake/Output this shift: No intake/output data recorded.  General appearance: alert, cooperative and no distress Heart: regular rate and rhythm Lungs: clear to auscultation bilaterally Abdomen: benign Extremities: + edema Wound: incis healing well  Lab Results: Recent Labs    05/24/19 0257 05/25/19 0233  WBC 7.3 8.9  HGB 7.5* 7.5*  HCT 23.3* 24.4*  PLT 348 402*   BMET:  Recent Labs    05/24/19 0257 05/25/19 0233  NA 141 141  K 4.1 4.5  CL 106 105  CO2 26 27  GLUCOSE 106* 134*  BUN 21 19  CREATININE 0.97 1.00  CALCIUM 7.4* 7.6*    PT/INR: No results for input(s): LABPROT, INR in the last 72 hours. ABG    Component Value Date/Time   PHART 7.263 (L) 05/15/2019 2129   HCO3 18.2 (L) 05/15/2019 2129   TCO2 19 (L) 05/15/2019 2129   ACIDBASEDEF 8.0 (H) 05/15/2019 2129   O2SAT 70.4 05/20/2019 0515   CBG (last 3)  Recent Labs    05/24/19 1639 05/24/19 2116 05/25/19 0735  GLUCAP 112* 119* 90    Meds Scheduled Meds: . atorvastatin  80 mg  Oral q1800  . bisacodyl  10 mg Oral Daily   Or  . bisacodyl  10 mg Rectal Daily  . Chlorhexidine Gluconate Cloth  6 each Topical Daily  . clopidogrel  75 mg Oral Daily  . docusate sodium  200 mg Oral Daily  . enoxaparin (LOVENOX) injection  30 mg Subcutaneous QHS  . furosemide  40 mg Oral BID  . insulin aspart  0-24 Units Subcutaneous TID AC & HS  . levothyroxine  50 mcg Oral Q0600  . metFORMIN  500 mg Oral BID WC  . metoprolol tartrate  12.5 mg Oral TID   Or  . metoprolol tartrate  12.5 mg Per Tube TID  . moving right along book   Does not apply Once  . pantoprazole  40 mg Oral Daily  . potassium chloride  20 mEq Oral BID  . sodium chloride flush  10-40 mL Intracatheter Q12H  . sodium chloride flush  3 mL Intravenous Q12H   Continuous Infusions: . sodium chloride     PRN Meds:.sodium chloride, acetaminophen, ondansetron (ZOFRAN) IV, oxyCODONE, sodium chloride flush, traMADol  Xrays No results found.  Assessment/Plan: S/P Procedure(s) (LRB): CORONARY ARTERY BYPASS GRAFTING (CABG) times two, left internal mammary artery to left anterior descending artery and left greater saphenous vein to posterior descending artery, left sapheouns vein harvested endoscopically  (N/A) TRANSESOPHAGEAL ECHOCARDIOGRAM (  TEE) (N/A)  1 conts to do well 2 hemodyn stable in sinus rhythm 3 sats excellent on RA 4 renal fxn stable ,minor decrease in GFR still some volume overload, will reduce lasix to q day 5 H/H stable , conts  Iron 6 BS controlled, conts metformin 7 waiting on SNF bed  LOS: 12 days    John Giovanni [facility] 05/25/2019 Pager [phone]  Agree with above [place]

## 2019-05-25 NOTE — Progress Notes (Signed)
CARDIAC REHAB PHASE I   PRE:  Rate/Rhythm: 81  BP:    Sitting: 120/50     SaO2: 95%  MODE:  Ambulation: 275 ft   POST:  Rate/Rhythem: 91  BP:    Sitting: 117/64     SaO2: 95% 1115-1200 Patient ambulated with assistance times one using the EVA. Gait slow but steady. Patient stopped times one to rest, standing. Did not need to sit down. Patient assisted back to recliner with call bell within reach. Sister present. Tolerated well. Increased distance. Encouraged use of incentive spirometer,  Harrell Gave RN BSN

## 2019-05-26 LAB — GLUCOSE, CAPILLARY
Glucose-Capillary: 104 mg/dL — ABNORMAL HIGH (ref 70–99)
Glucose-Capillary: 92 mg/dL (ref 70–99)
Glucose-Capillary: 118 mg/dL — ABNORMAL HIGH (ref 70–99)
Glucose-Capillary: 100 mg/dL — ABNORMAL HIGH (ref 70–99)

## 2019-05-26 NOTE — Progress Notes (Signed)
Ambulated 3 times today with standard walker. Independent with the walker, this RN walked behind her with sitting walker which we did not have to use. Required a few standing breaks. Tolerated well.

## 2019-05-26 NOTE — Progress Notes (Addendum)
[address]       [place] [address]             [phone]      11 Days Post-Op Procedure(s) (LRB): CORONARY ARTERY BYPASS GRAFTING (CABG) times two, left internal mammary artery to left anterior descending artery and left greater saphenous vein to posterior descending artery, left sapheouns vein harvested endoscopically  (N/A) TRANSESOPHAGEAL ECHOCARDIOGRAM (TEE) (N/A) Subjective: Looks and feels stronger  Objective: Vital signs in last 24 hours: Temp:  [97.8 F (36.6 C)-98.6 F (37 C)] 98.6 F (37 C) (09/27 0849) Pulse Rate:  [67-85] 67 (09/27 0849) Cardiac Rhythm: Normal sinus rhythm (09/26 1912) Resp:  [12-19] 14 (09/27 0849) BP: (104-123)/(47-54) 104/49 (09/27 0849) SpO2:  [96 %-98 %] 97 % (09/27 0447) Weight:  [90.7 kg] 90.7 kg (09/27 0447)  Hemodynamic parameters for last 24 hours:    Intake/Output from previous day: 09/26 0701 - 09/27 0700 In: 240 [P.O.:240] Out: 375 [Urine:375] Intake/Output this shift: Total I/O In: 240 [P.O.:240] Out: -   General appearance: alert, cooperative and no distress Heart: regular rate and rhythm Lungs: clear to auscultation bilaterally Abdomen: benign Extremities: minor edema Wound: incis healing well  Lab Results: Recent Labs    05/24/19 0257 05/25/19 0233  WBC 7.3 8.9  HGB 7.5* 7.5*  HCT 23.3* 24.4*  PLT 348 402*   BMET:  Recent Labs    05/24/19 0257 05/25/19 0233  NA 141 141  K 4.1 4.5  CL 106 105  CO2 26 27  GLUCOSE 106* 134*  BUN 21 19  CREATININE 0.97 1.00  CALCIUM 7.4* 7.6*    PT/INR: No results for input(s): LABPROT, INR in the last 72 hours. ABG    Component Value Date/Time   PHART 7.263 (L) 05/15/2019 2129   HCO3 18.2 (L) 05/15/2019 2129   TCO2 19 (L) 05/15/2019 2129   ACIDBASEDEF 8.0 (H) 05/15/2019 2129   O2SAT 70.4 05/20/2019 0515   CBG (last 3)  Recent Labs    05/25/19 1551 05/25/19 2123 05/26/19 0609  GLUCAP 91 83 104*    Meds Scheduled Meds: .  atorvastatin  80 mg Oral q1800  . bisacodyl  10 mg Oral Daily   Or  . bisacodyl  10 mg Rectal Daily  . Chlorhexidine Gluconate Cloth  6 each Topical Daily  . clopidogrel  75 mg Oral Daily  . docusate sodium  200 mg Oral Daily  . enoxaparin (LOVENOX) injection  30 mg Subcutaneous QHS  . furosemide  40 mg Oral Daily  . insulin aspart  0-24 Units Subcutaneous TID AC & HS  . levothyroxine  50 mcg Oral Q0600  . metFORMIN  500 mg Oral BID WC  . metoprolol tartrate  12.5 mg Oral TID   Or  . metoprolol tartrate  12.5 mg Per Tube TID  . moving right along book   Does not apply Once  . pantoprazole  40 mg Oral Daily  . potassium chloride  20 mEq Oral BID  . sodium chloride flush  10-40 mL Intracatheter Q12H  . sodium chloride flush  3 mL Intravenous Q12H   Continuous Infusions: . sodium chloride     PRN Meds:.sodium chloride, acetaminophen, ondansetron (ZOFRAN) IV, oxyCODONE, sodium chloride flush, traMADol  Xrays No results found.  Assessment/Plan: S/P Procedure(s) (LRB): CORONARY ARTERY BYPASS GRAFTING (CABG) times two, left internal mammary artery to left anterior descending artery and left greater saphenous vein to posterior descending artery, left sapheouns vein harvested  endoscopically  (N/A) TRANSESOPHAGEAL ECHOCARDIOGRAM (TEE) (N/A) 1 conts to do well 2 hemodyn stable in sinus rhythm 3 sats good on RA 5 no fevers 6 sugars well controlled 7 mild volume overload, cont gentle diuresis 8 SNF when bed available  LOS: 13 days    Megan [name] [facility] 05/26/2019 Pager [phone]  Agree with above Awaiting placement  Isa Hitz O Abrial Arrighi

## 2019-05-26 NOTE — Progress Notes (Signed)
Occupational Therapy Treatment Patient Details Name: Megan [name] MRN: [id number] DOB: Nov 05, 1945 Today's Date: 05/26/2019    History of present illness 73 y.o. female who presented to the ED with c/o chest pain and admitted for NSTEMI. She underwent CABG 05/15/19. PMH consists of recent R ankle fx (12/2018), L knee DJD, OA, HTN, glaucoma, LE cellulitis, DM, depression, anxiety, and asthma.   OT comments  Pt progressing well. Pt limited by pain, weakness and inability to properly care for self. Pt modA overall for bed mobility for BLEs required to lift onto bed from sitting EOB; bed elevation required for sit to stand. Pt performing grooming at sink with minguardA and toileting at [facility] with minguardA. Pt easily fatigued and requires cues to rest in sitting.  Pt performing toilet hygiene with no UE supported. Pt does require minA for power-up and several attempts. Pt would greatly benefit from continued OT skilled service for ADL, mobility and safety with precautions in SNF setting. OT following acutely.    Follow Up Recommendations  SNF    Equipment Recommendations  Other (comment)(to be determined)    Recommendations for Other Services      Precautions / Restrictions Precautions Precautions: Fall;Sternal Restrictions Weight Bearing Restrictions: Yes Other Position/Activity Restrictions: sternal precautions        Mobility Bed Mobility Overal bed mobility: Needs Assistance Bed Mobility: Supine to Sit     Supine to sit: Supervision Sit to supine: Mod assist   General bed mobility comments: Increased time, use of rails; BLEs required to lift onto bed from sitting EOB; bed elevation required for sit to stand  Transfers Overall transfer level: Needs assistance Equipment used: Rolling walker (2 wheeled) Transfers: Sit to/from Stand Sit to Stand: Min assist Stand pivot transfers: Min guard       General transfer comment: MinA for power-up. Pt with acceptable hand  positions    Balance Overall balance assessment: Needs assistance Sitting-balance support: No upper extremity supported;Feet supported Sitting balance-Leahy Scale: Fair     Standing balance support: No upper extremity supported;During functional activity Standing balance-Leahy Scale: Fair Standing balance comment: performing toilet hygiene with no UE supported.                           ADL either performed or assessed with clinical judgement   ADL Overall ADL's : Needs assistance/impaired     Grooming: Min guard;Standing                   Toilet Transfer: Minimal assistance;Ambulation;BSC   Toileting- Water quality scientist and Hygiene: Min guard;Cueing for safety;Sit to/from stand Toileting - Clothing Manipulation Details (indicate cue type and reason): Stood for task at [facility] ~1 minute     Functional mobility during ADLs: Min guard;Rolling walker;Cueing for safety General ADL Comments: pt limited by pain and weakness. Pt performing grooming at sink with minguardA and toileting at [facility] with minguardA. Pt easily fatigued and requires cues to rest in sitting     Vision   Additional Comments: pt has glaucoma   Perception     Praxis      Cognition Arousal/Alertness: Awake/alert Behavior During Therapy: Anxious Overall Cognitive Status: Within Functional Limits for tasks assessed                                 General Comments: Pt able to calm self down easily.  Exercises     Shoulder Instructions       General Comments HR to up 101 BPM with activity    Pertinent Vitals/ Pain       Pain Assessment: 0-10 Pain Score: 3  Pain Location: chest/incision Pain Descriptors / Indicators: Grimacing;Guarding Pain Intervention(s): Monitored during session  Home Living                                          Prior Functioning/Environment              Frequency  Min 2X/week        Progress Toward  Goals  OT Goals(current goals can now be found in the care plan section)  Progress towards OT goals: Progressing toward goals  Acute Rehab OT Goals Patient Stated Goal: to get stronger and be able to take care of myself OT Goal Formulation: With patient Time For Goal Achievement: 06/03/19 Potential to Achieve Goals: Fair ADL Goals Pt Will Perform Grooming: with modified independence;sitting Pt Will Perform Upper Body Bathing: sitting;with supervision;with set-up Pt Will Perform Lower Body Dressing: sit to/from stand;with mod assist;with adaptive equipment Pt Will Transfer to Toilet: bedside commode;stand pivot transfer;with mod assist Pt Will Perform Toileting - Clothing Manipulation and hygiene: with mod assist;sit to/from stand Pt/caregiver will Perform Home Exercise Program: Increased strength;Both right and left upper extremity;With written HEP provided Additional ADL Goal #1: Pt will complete bed mobility with min assist and sustain dynamic sitting balance with supervision as precursor to ADLs.  Plan Discharge plan remains appropriate;Frequency needs to be updated    Co-evaluation                 AM-PAC OT "6 Clicks" Daily Activity     Outcome Measure   Help from another person eating meals?: None Help from another person taking care of personal grooming?: A Little Help from another person toileting, which includes using toliet, bedpan, or urinal?: A Little Help from another person bathing (including washing, rinsing, drying)?: A Lot Help from another person to put on and taking off regular upper body clothing?: A Little Help from another person to put on and taking off regular lower body clothing?: A Lot 6 Click Score: 17    End of Session Equipment Utilized During Treatment: Gait belt;Rolling walker  OT Visit Diagnosis: Other abnormalities of gait and mobility (R26.89);Pain;Muscle weakness (generalized) (M62.81) Pain - part of body: (chest)   Activity Tolerance  Patient tolerated treatment well   Patient Left in bed;with call bell/phone within reach;with bed alarm set   Nurse Communication Mobility status        Time: 1342-1400 OT Time Calculation (min): 18 min  Charges: OT General Charges $OT Visit: 1 Visit OT Treatments $Self Care/Home Management : 8-22 mins  Darryl Nestle) Marsa Aris OTR/L Acute Rehabilitation Services Pager: [phone] Office: [phone]    Jenene Slicker Keriann Rankin 05/26/2019, 3:30 PM

## 2019-05-27 ENCOUNTER — Ambulatory Visit: Payer: Medicare Other | Admitting: Cardiovascular Disease

## 2019-05-27 ENCOUNTER — Telehealth: Payer: Self-pay | Admitting: Cardiovascular Disease

## 2019-05-27 LAB — GLUCOSE, CAPILLARY
Glucose-Capillary: 102 mg/dL — ABNORMAL HIGH (ref 70–99)
Glucose-Capillary: 118 mg/dL — ABNORMAL HIGH (ref 70–99)
Glucose-Capillary: 117 mg/dL — ABNORMAL HIGH (ref 70–99)
Glucose-Capillary: 96 mg/dL (ref 70–99)

## 2019-05-27 NOTE — Progress Notes (Signed)
CARDIAC REHAB PHASE I   PRE:  Rate/Rhythm: 85 SR  BP:  Sitting: 101/33      SaO2: 93 RA  MODE:  Ambulation: 200 ft   POST:  Rate/Rhythm: 104 ST  BP:  Sitting: 114/55    SaO2: 97 RA   Pt ambulated 225ft in hallway assist of 2 with front wheel walker and gait belt. Pt able to increase speed and distance compared to Friday's walk. Pt continues to need reminders about sternal precautions. Pt educated on site care, sternal precautions, and continued IS use. Encouraged to continue diabetic diet. Will refer to CRP II [place], pt aware she need to completed rehab prior to attending.  [id number] Rufina Falco, RN BSN 05/27/2019 10:13 AM

## 2019-05-27 NOTE — Progress Notes (Signed)
Occupational Therapy Treatment Patient Details Name: Megan [name] MRN: [id number] DOB: 1946-04-30 Today's Date: 05/27/2019    History of present illness 73 y.o. female who presented to the ED with c/o chest pain and admitted for NSTEMI. She underwent CABG 05/15/19. PMH consists of recent R ankle fx (12/2018), L knee DJD, OA, HTN, glaucoma, LE cellulitis, DM, depression, anxiety, and asthma.   OT comments  Patient progressing well. Continues to require min assist for bed mobility, transfers and toileting.  Cueing for sternal precautions during transfers, especially when fatigued but noted great recall of precautions initially.  Initiated energy conservation technique education. Patient remains SNF rehab appropriate.    Follow Up Recommendations  SNF    Equipment Recommendations  Other (comment)(TBD at next venue of care)    Recommendations for Other Services      Precautions / Restrictions Precautions Precautions: Fall;Sternal Precaution Comments: good recall of precautions without cueing Restrictions Weight Bearing Restrictions: Yes Other Position/Activity Restrictions: sternal precautions        Mobility Bed Mobility Overal bed mobility: Needs Assistance Bed Mobility: Supine to Sit     Supine to sit: Min assist     General bed mobility comments: increased time, min assist to ascend trunk to EOB  Transfers Overall transfer level: Needs assistance Equipment used: Rolling walker (2 wheeled) Transfers: Sit to/from Stand Sit to Stand: Min assist         General transfer comment: min assist to power up to standing with cueing for hand placement and technique     Balance Overall balance assessment: Needs assistance Sitting-balance support: No upper extremity supported;Feet supported Sitting balance-Leahy Scale: Fair     Standing balance support: Bilateral upper extremity supported;During functional activity Standing balance-Leahy Scale: Poor Standing balance  comment: reliant on BUE dynamically, able to stand statically during grooming tasks with min guard                           ADL either performed or assessed with clinical judgement   ADL Overall ADL's : Needs assistance/impaired     Grooming: Min guard;Standing;Wash/dry Geophysical data processor Transfer: Minimal assistance;Ambulation;BSC Toilet Transfer Details (indicate cue type and reason): sit to stand, cueing for hand placement  Toileting- Clothing Manipulation and Hygiene: Min guard;Cueing for safety;Sit to/from stand       Functional mobility during ADLs: Min guard;Rolling walker;Cueing for safety General ADL Comments: pt limited by weakness and decreased activity tolerance     Vision       Perception     Praxis      Cognition Arousal/Alertness: Awake/alert Behavior During Therapy: WFL for tasks assessed/performed Overall Cognitive Status: Within Functional Limits for tasks assessed                                          Exercises Exercises: General Lower Extremity General Exercises - Lower Extremity Gluteal Sets: AROM;Both;10 reps;Seated Long Arc Quad: AROM;Both;10 reps;Seated Hip ABduction/ADduction: AROM;Both;10 reps;Seated Hip Flexion/Marching: AROM;Both;10 reps;Seated Toe Raises: AROM;Both;10 reps;Seated Heel Raises: AROM;Both;10 reps;Seated   Shoulder Instructions       General Comments VSS, initated education on energy conservation techniques for self care routine- pt reports using AE at home, and pre-planning day already    Pertinent Vitals/ Pain  Pain Assessment: Faces Faces Pain Scale: No hurt  Home Living                                          Prior Functioning/Environment              Frequency  Min 2X/week        Progress Toward Goals  OT Goals(current goals can now be found in the care plan section)  Progress towards OT goals: Progressing toward  goals  Acute Rehab OT Goals Patient Stated Goal: to get stronger and be able to take care of myself OT Goal Formulation: With patient  Plan Discharge plan remains appropriate;Frequency remains appropriate    Co-evaluation                 AM-PAC OT "6 Clicks" Daily Activity     Outcome Measure   Help from another person eating meals?: None Help from another person taking care of personal grooming?: A Little Help from another person toileting, which includes using toliet, bedpan, or urinal?: A Little Help from another person bathing (including washing, rinsing, drying)?: A Lot Help from another person to put on and taking off regular upper body clothing?: A Little Help from another person to put on and taking off regular lower body clothing?: A Lot 6 Click Score: 17    End of Session Equipment Utilized During Treatment: Gait belt;Rolling walker  OT Visit Diagnosis: Other abnormalities of gait and mobility (R26.89);Pain;Muscle weakness (generalized) (M62.81)   Activity Tolerance Patient tolerated treatment well   Patient Left Other (comment)(with PT )   Nurse Communication Mobility status        Time: [id number] OT Time Calculation (min): 15 min  Charges: OT General Charges $OT Visit: 1 Visit OT Treatments $Self Care/Home Management : 8-22 mins  Delight Stare, OT Acute Rehabilitation Services Pager [phone] Office [phone]    Delight Stare 05/27/2019, 5:07 PM

## 2019-05-27 NOTE — Telephone Encounter (Signed)
New Message    [facility] patient scheduled for 06/06/19 @ 10:45am with Wallis Bamberg.

## 2019-05-27 NOTE — Progress Notes (Signed)
Progress Note  Patient Name: Megan [name] Date of Encounter: 05/27/2019  Primary Cardiologist: Mertie Moores, MD   Subjective   C/o sternal pain, otherwise doing ok. Walking in halls with assistance.   Inpatient Medications    Scheduled Meds: . atorvastatin  80 mg Oral q1800  . bisacodyl  10 mg Oral Daily   Or  . bisacodyl  10 mg Rectal Daily  . Chlorhexidine Gluconate Cloth  6 each Topical Daily  . clopidogrel  75 mg Oral Daily  . docusate sodium  200 mg Oral Daily  . enoxaparin (LOVENOX) injection  30 mg Subcutaneous QHS  . furosemide  40 mg Oral Daily  . insulin aspart  0-24 Units Subcutaneous TID AC & HS  . levothyroxine  50 mcg Oral Q0600  . metFORMIN  500 mg Oral BID WC  . metoprolol tartrate  12.5 mg Oral TID   Or  . metoprolol tartrate  12.5 mg Per Tube TID  . moving right along book   Does not apply Once  . pantoprazole  40 mg Oral Daily  . potassium chloride  20 mEq Oral BID  . sodium chloride flush  3 mL Intravenous Q12H   Continuous Infusions: . sodium chloride     PRN Meds: sodium chloride, acetaminophen, ondansetron (ZOFRAN) IV, oxyCODONE, sodium chloride flush, traMADol   Vital Signs    Vitals:   05/26/19 2045 05/27/19 0019 05/27/19 0746 05/27/19 1315  BP: (!) 107/47 (!) 105/54 113/89 115/71  Pulse: 80 77 84 85  Resp: 15 12  16   Temp: 97.8 F (36.6 C) 97.9 F (36.6 C) 97.8 F (36.6 C) 98.1 F (36.7 C)  TempSrc: Oral Oral Oral Oral  SpO2: 100% 99% 98% 97%  Weight:      Height:        Intake/Output Summary (Last 24 hours) at 05/27/2019 1602 Last data filed at 05/27/2019 1348 Gross per 24 hour  Intake 480 ml  Output 650 ml  Net -170 ml   Last 3 Weights 05/26/2019 05/25/2019 05/24/2019  Weight (lbs) 199 lb 15.3 oz 200 lb 6.4 oz 197 lb 5 oz  Weight (kg) 90.7 kg 90.901 kg 89.5 kg      Telemetry    Sinus rhythm without arrhythmia  - Personally Reviewed   Physical Exam  Alert, oriented, in NAD. Sitting in chair at bedside.  GEN:  No acute distress.   Neck: No JVD Cardiac: RRR, no murmurs, rubs, or gallops.  Respiratory: Clear to auscultation bilaterally. GI: Soft, nontender, non-distended  MS: No edema; No deformity. Neuro:  Nonfocal  Psych: Normal affect   Labs    High Sensitivity Troponin:   Recent Labs  Lab 05/13/19 0759 05/13/19 1031  TROPONINIHS 431* 3,549*      Chemistry Recent Labs  Lab 05/23/19 0258 05/24/19 0257 05/25/19 0233  NA 139 141 141  K 3.8 4.1 4.5  CL 104 106 105  CO2 24 26 27   GLUCOSE 119* 106* 134*  BUN 19 21 19   CREATININE 0.94 0.97 1.00  CALCIUM 7.7* 7.4* 7.6*  GFRNONAA >60 58* 56*  GFRAA >60 >60 >60  ANIONGAP 11 9 9      Hematology Recent Labs  Lab 05/23/19 0258 05/24/19 0257 05/25/19 0233  WBC 7.5 7.3 8.9  RBC 2.74* 2.58* 2.64*  HGB 7.9* 7.5* 7.5*  HCT 24.5* 23.3* 24.4*  MCV 89.4 90.3 92.4  MCH 28.8 29.1 28.4  MCHC 32.2 32.2 30.7  RDW 15.6* 15.6* 15.6*  PLT 331 348 402*  BNPNo results for input(s): BNP, PROBNP in the last 168 hours.   DDimer No results for input(s): DDIMER in the last 168 hours.   Radiology    No results found.  Cardiac Studies   2D Echo 05-13-2019: IMPRESSIONS    1. The left ventricle has severely reduced systolic function, with an ejection fraction of 25-30%. The cavity size was normal. There is mildly increased left ventricular wall thickness. Left ventricular diastolic Doppler parameters are consistent with  impaired relaxation. Mid to apical anteroseptal and inferoseptal akinesis. Apical anterior, apical lateral, and apical inferior akinesis. Akinesis of the true apex. No LV thrombus noted.  2. Normal RV size with mildly decreased systolic function.  3. There is mild mitral annular calcification present. No evidence of mitral valve stenosis. Trivial mitral regurgitation.  4. The aortic root is normal in size and structure.  5. The aortic valve is tricuspid. No stenosis of the aortic valve.  6. Left atrial size was mildly  dilated.  7. The IVC was normal in size with PA systolic pressure 24 mmHg.  Patient Profile     73 y.o. female s/p multivessel CABG  Assessment & Plan    1. NSTEMI - multivessel CAD, treated with CABG 2. Acute systolic heart failure - post-op volume overload, improved. Medical therapy reviewed.  3. Post-op afib - no recurrence, off of amiodarone 4. Anemia, post-op blood loss. HgB 7.5 mg/dL, stable, management per surgical team.  Progressing slowly. Plans noted for ST-SNF before she returns home to independent living. Medical program reviewed and includes clopidogrel for antiplatelet Rx, beta blocker, and high-intensity statin drug. Add ACE-I or ARB as outpatient as she recovers. At present BP too low. Will arrange OP follow-up with Dr Acie Fredrickson. Please call if we can help with her care.   [facility] will sign off.   Medication Recommendations:  Continue current Rx Other recommendations (labs, testing, etc):  none Follow up as an outpatient:  Will arrange with Dr Acie Fredrickson once she returns home.  For questions or updates, please contact [place] Please consult [email or web address] for contact info under        Signed, Sherren Mocha, MD  05/27/2019, 4:02 PM

## 2019-05-27 NOTE — Progress Notes (Addendum)
12 Days Post-Op Procedure(s) (LRB): CORONARY ARTERY BYPASS GRAFTING (CABG) times two, left internal mammary artery to left anterior descending artery and left greater saphenous vein to posterior descending artery, left sapheouns vein harvested endoscopically  (N/A) TRANSESOPHAGEAL ECHOCARDIOGRAM (TEE) (N/A) Subjective: Awake and alert, sitting up eating breakfast.  She feels like she made progress with mobility over the weekend. Using O2 intermittently at night.   Objective: Vital signs in last 24 hours: Temp:  [97.8 F (36.6 C)-98.6 F (37 C)] 97.8 F (36.6 C) (09/28 0746) Pulse Rate:  [67-84] 84 (09/28 0746) Cardiac Rhythm: Normal sinus rhythm (09/27 1951) Resp:  [12-16] 12 (09/28 0019) BP: (104-119)/(47-89) 113/89 (09/28 0746) SpO2:  [90 %-100 %] 98 % (09/28 0746)     Intake/Output from previous day: 09/27 0701 - 09/28 0700 In: 480 [P.O.:480] Out: -  Intake/Output this shift: No intake/output data recorded.  General appearance: alert, cooperative and no distress Neurologic: intact Heart: NSR, no significant arrhythmias on monitor review.  Lungs: Breath sounds CTA. No dyspnea on RA at rest.  Abdomen: Soft, NT. Wound: The sternotomy incision and left leg EVH incision are healing with no sign of complication.  Lab Results: Recent Labs    05/25/19 0233  WBC 8.9  HGB 7.5*  HCT 24.4*  PLT 402*   BMET:  Recent Labs    05/25/19 0233  NA 141  K 4.5  CL 105  CO2 27  GLUCOSE 134*  BUN 19  CREATININE 1.00  CALCIUM 7.6*    PT/INR: No results for input(s): LABPROT, INR in the last 72 hours. ABG    Component Value Date/Time   PHART 7.263 (L) 05/15/2019 2129   HCO3 18.2 (L) 05/15/2019 2129   TCO2 19 (L) 05/15/2019 2129   ACIDBASEDEF 8.0 (H) 05/15/2019 2129   O2SAT 70.4 05/20/2019 0515   CBG (last 3)  Recent Labs    05/26/19 1706 05/26/19 2133 05/27/19 0614  GLUCAP 118* 100* 102*    Assessment/Plan: S/P Procedure(s) (LRB): CORONARY ARTERY BYPASS  GRAFTING (CABG) times two, left internal mammary artery to left anterior descending artery and left greater saphenous vein to posterior descending artery, left sapheouns vein harvested endoscopically  (N/A) TRANSESOPHAGEAL ECHOCARDIOGRAM (TEE) (N/A)  -POD12CABG x 2 for CAD presenting with an acute NSTEMI and pre-op EF 30-35%. VS stable.Making slow progress with mobility. ContinuePlavix due to ASA allergy.Continue PT, OT. She has elected to proceed with SNF placement for further rehab prior to planned return to independent living at home.She is medically stable for discharge. COVID screen negative.  Awaiting bed availability.  -Atrial fibrillation--She is maintaining SR with on metoprolol alone.Will continuetoobserve rhythm.  -Volume excess--Wt has been relatively stable past few days.  continue Lasix PO.Recheck BMP in AM.   -Chronic kidney disease, stage 3- Stable renal function. Re-check BMP in AM.   -Type 2 diabetes mellitus-Pre-op A1C 6.3. Glucose well controlled.ContinueSSIandmetformin 500mg  po BID.  -Expected acute blood loss anemia--Hct slowly drifting down. Iron supplement not ordered due to reported allergy to Fe++. Check CBC in AM.  -Thrombocytopenia-Resolved  -GERD- Continue PPI.  -DVT PPX-Continue enoxaparin.    LOS: 14 days    Malon Kindle [id number] 05/27/2019  Continues to improve, waiting for SNF I have seen and examined Megan [name] and agree with the above assessment  and plan.  Grace Isaac MD Beeper [phone] Office [phone] 05/27/2019 8:37 AM

## 2019-05-27 NOTE — Progress Notes (Signed)
Physical Therapy Treatment Patient Details Name: Megan [name] MRN: [id number] DOB: 1946-06-16 Today's Date: 05/27/2019    History of Present Illness 73 y.o. female who presented to the ED with c/o chest pain and admitted for NSTEMI. She underwent CABG 05/15/19. PMH consists of recent R ankle fx (12/2018), L knee DJD, OA, HTN, glaucoma, LE cellulitis, DM, depression, anxiety, and asthma.    PT Comments    Pt working with OT on entry, and willing to ambulate afterwards. Pt reports that she did not feel well after ambulating with [facility] this morning so she limited her ambulation to 100 feet with RW and min guard assist. Once back in room pt agreeable to therapeutic exercises as well. D/c plans remain appropriate. PT will continue to follow acutely.    Follow Up Recommendations  SNF     Equipment Recommendations  Other (comment)(TBD)    Recommendations for Other Services Rehab consult     Precautions / Restrictions Precautions Precautions: Fall;Sternal Precaution Comments: good recall of precautions without cueing Restrictions Weight Bearing Restrictions: Yes Other Position/Activity Restrictions: sternal precautions     Mobility   Transfers                 General transfer comment: up at sink with OT on entry   Ambulation/Gait Ambulation/Gait assistance: +2 safety/equipment;Min guard Gait Distance (Feet): 100 Feet Assistive device: Rolling walker (2 wheeled) Gait Pattern/deviations: Step-through pattern;Decreased stride length;Decreased step length - right;Decreased step length - left;Drifts right/left;Trunk flexed;Wide base of support Gait velocity: slowed Gait velocity interpretation: <1.31 ft/sec, indicative of household ambulator General Gait Details: pt reports ambulating 200 feet wtih Cardiac rehab this morning and felt badly afterwards so self limited her self this afternoon because she had just worked with OT        Balance Overall balance  assessment: Needs assistance Sitting-balance support: No upper extremity supported;Feet supported Sitting balance-Leahy Scale: Fair     Standing balance support: Bilateral upper extremity supported;During functional activity Standing balance-Leahy Scale: Poor Standing balance comment: reliant on BUE                             Cognition Arousal/Alertness: Awake/alert Behavior During Therapy: WFL for tasks assessed/performed Overall Cognitive Status: Within Functional Limits for tasks assessed                                        Exercises General Exercises - Lower Extremity Gluteal Sets: AROM;Both;10 reps;Seated Long Arc Quad: AROM;Both;10 reps;Seated Hip ABduction/ADduction: AROM;Both;10 reps;Seated Hip Flexion/Marching: AROM;Both;10 reps;Seated Toe Raises: AROM;Both;10 reps;Seated Heel Raises: AROM;Both;10 reps;Seated    General Comments General comments (skin integrity, edema, etc.): HR max 106 bpm with activity       Pertinent Vitals/Pain Pain Assessment: Faces Faces Pain Scale: No hurt       Prior Function            PT Goals (current goals can now be found in the care plan section) Acute Rehab PT Goals Patient Stated Goal: to get stronger and be able to take care of myself PT Goal Formulation: With patient Time For Goal Achievement: 05/31/19 Potential to Achieve Goals: Good Progress towards PT goals: Progressing toward goals    Frequency    Min 3X/week      PT Plan Current plan remains appropriate       AM-PAC PT "  6 Clicks" Mobility   Outcome Measure  Help needed turning from your back to your side while in a flat bed without using bedrails?: A Lot Help needed moving from lying on your back to sitting on the side of a flat bed without using bedrails?: A Lot Help needed moving to and from a bed to a chair (including a wheelchair)?: A Little Help needed standing up from a chair using your arms (e.g., wheelchair or  bedside chair)?: A Little Help needed to walk in hospital room?: A Little Help needed climbing 3-5 steps with a railing? : Total 6 Click Score: 14    End of Session Equipment Utilized During Treatment: Gait belt Activity Tolerance: Patient tolerated treatment well Patient left: with call bell/phone within reach;in chair;with nursing/sitter in room Nurse Communication: Mobility status;Precautions PT Visit Diagnosis: Other abnormalities of gait and mobility (R26.89);Muscle weakness (generalized) (M62.81)     Time: [id number] PT Time Calculation (min) (ACUTE ONLY): 14 min  Charges:  $Gait Training: 8-22 mins                     Hertha Gergen B. Migdalia Dk PT, DPT Acute Rehabilitation Services Pager [phone] Office [phone]    [place] 05/27/2019, 4:21 PM

## 2019-05-28 ENCOUNTER — Inpatient Hospital Stay (HOSPITAL_COMMUNITY): Payer: Medicare Other

## 2019-05-28 LAB — BASIC METABOLIC PANEL
Anion gap: 10 (ref 5–15)
BUN: 14 mg/dL (ref 8–23)
CO2: 28 mmol/L (ref 22–32)
Calcium: 7.4 mg/dL — ABNORMAL LOW (ref 8.9–10.3)
Chloride: 100 mmol/L (ref 98–111)
Creatinine, Ser: 0.93 mg/dL (ref 0.44–1.00)
GFR calc Af Amer: >60 mL/min (ref >60)
GFR calc non Af Amer: >60 mL/min (ref >60)
Glucose, Bld: 104 mg/dL — ABNORMAL HIGH (ref 70–99)
Potassium: 4.6 mmol/L (ref 3.5–5.1)
Sodium: 138 mmol/L (ref 135–145)

## 2019-05-28 LAB — NOVEL CORONAVIRUS, NAA (HOSP ORDER, SEND-OUT TO REF LAB; TAT 18-24 HRS): SARS-CoV-2, NAA: NOT DETECTED

## 2019-05-28 LAB — CBC
HCT: 25.1 % — ABNORMAL LOW (ref 36.0–46.0)
Hemoglobin: 7.7 g/dL — ABNORMAL LOW (ref 12.0–15.0)
MCH: 27.9 pg (ref 26.0–34.0)
MCHC: 30.7 g/dL (ref 30.0–36.0)
MCV: 90.9 fL (ref 80.0–100.0)
Platelets: 481 10*3/uL — ABNORMAL HIGH (ref 150–400)
RBC: 2.76 MIL/uL — ABNORMAL LOW (ref 3.87–5.11)
RDW: 15.1 % (ref 11.5–15.5)
WBC: 8.7 10*3/uL (ref 4.0–10.5)
nRBC: 0 % (ref 0.0–0.2)

## 2019-05-28 LAB — GLUCOSE, CAPILLARY
Glucose-Capillary: 90 mg/dL (ref 70–99)
Glucose-Capillary: 106 mg/dL — ABNORMAL HIGH (ref 70–99)
Glucose-Capillary: 105 mg/dL — ABNORMAL HIGH (ref 70–99)
Glucose-Capillary: 111 mg/dL — ABNORMAL HIGH (ref 70–99)

## 2019-05-28 MED ORDER — FUROSEMIDE 40 MG PO TABS: 40.0000 mg | ORAL_TABLET | Freq: Every day | ORAL | 1 refills | Status: DC

## 2019-05-28 MED ORDER — ATORVASTATIN CALCIUM 80 MG PO TABS: 80.0000 mg | ORAL_TABLET | Freq: Every day | ORAL | 2 refills | Status: AC

## 2019-05-28 MED ORDER — TAB-A-VITE/IRON PO TABS: 1.0000 | ORAL_TABLET | Freq: Every day | ORAL | 1 refills | Status: DC

## 2019-05-28 MED ORDER — POTASSIUM CHLORIDE CRYS ER 20 MEQ PO TBCR: 20.0000 meq | EXTENDED_RELEASE_TABLET | Freq: Every day | ORAL | 2 refills | Status: DC

## 2019-05-28 MED ORDER — TAB-A-VITE/IRON PO TABS
1.0000 | ORAL_TABLET | Freq: Every day | ORAL | Status: DC
  Administered 2019-05-28 – 2019-05-29 (×2): 1 via ORAL
  Filled 2019-05-28 (×2): qty 1

## 2019-05-28 MED ORDER — METOPROLOL TARTRATE 25 MG PO TABS: 12.5000 mg | ORAL_TABLET | Freq: Two times a day (BID) | ORAL | 2 refills | Status: DC

## 2019-05-28 MED ORDER — TRAMADOL HCL 50 MG PO TABS: 50.0000 mg | ORAL_TABLET | ORAL | 0 refills | Status: AC | PRN

## 2019-05-28 MED ORDER — CLOPIDOGREL BISULFATE 75 MG PO TABS: 75.0000 mg | ORAL_TABLET | Freq: Every day | ORAL | 2 refills | Status: AC

## 2019-05-28 NOTE — Progress Notes (Signed)
[id number] Came to see pt to walk. Sitting in recliner. Had just thrown up and not feeling up to walking at this time. Encouraged her to walk with staff later if not discharged. Encouraged IS. Pt walked with Nursing student earlier today to desk. Graylon Good RN BSN 05/28/2019 2:03 PM

## 2019-05-28 NOTE — Progress Notes (Addendum)
13 Days Post-Op Procedure(s) (LRB): CORONARY ARTERY BYPASS GRAFTING (CABG) times two, left internal mammary artery to left anterior descending artery and left greater saphenous vein to posterior descending artery, left sapheouns vein harvested endoscopically  (N/A) TRANSESOPHAGEAL ECHOCARDIOGRAM (TEE) (N/A) Subjective: Had an episode of weakness and dizziness yesterday afternoon that resolved without any intervention. Cardiac rhythm and VS were stable at the time.  Rested well last night. Sitting up eating breakfast this morning. She feels positive about transitioning to SNF for further rehab.   Objective: Vital signs in last 24 hours: Temp:  [97.5 F (36.4 C)-98.1 F (36.7 C)] 97.9 F (36.6 C) (09/29 0733) Pulse Rate:  [81-103] 86 (09/29 0733) Cardiac Rhythm: Normal sinus rhythm (09/29 0340) Resp:  [16-19] 18 (09/29 0733) BP: (106-138)/(36-71) 121/36 (09/29 0733) SpO2:  [93 %-100 %] 98 % (09/29 0733) Weight:  [88.8 kg] 88.8 kg (09/29 0431)  Hemodynamic parameters for last 24 hours:    Intake/Output from previous day: 09/28 0701 - 09/29 0700 In: 480 [P.O.:480] Out: 650 [Urine:650] Intake/Output this shift: No intake/output data recorded.  General appearance: alert, cooperative and no distress Neurologic: intact Heart: regular rate and rhythm Lungs: CXR reviewed. Improving aeration left base. Decrease in effusion.  Extremities: Moderate LE edema.  Lab Results: Recent Labs    05/28/19 0232  WBC 8.7  HGB 7.7*  HCT 25.1*  PLT 481*   BMET:  Recent Labs    05/28/19 0232  NA 138  K 4.6  CL 100  CO2 28  GLUCOSE 104*  BUN 14  CREATININE 0.93  CALCIUM 7.4*    PT/INR: No results for input(s): LABPROT, INR in the last 72 hours. ABG    Component Value Date/Time   PHART 7.263 (L) 05/15/2019 2129   HCO3 18.2 (L) 05/15/2019 2129   TCO2 19 (L) 05/15/2019 2129   ACIDBASEDEF 8.0 (H) 05/15/2019 2129   O2SAT 70.4 05/20/2019 0515   CBG (last 3)  Recent Labs     05/27/19 1627 05/27/19 2115 05/28/19 0629  GLUCAP 117* 96 90    Assessment/Plan: S/P Procedure(s) (LRB): CORONARY ARTERY BYPASS GRAFTING (CABG) times two, left internal mammary artery to left anterior descending artery and left greater saphenous vein to posterior descending artery, left sapheouns vein harvested endoscopically  (N/A) TRANSESOPHAGEAL ECHOCARDIOGRAM (TEE) (N/A)  -POD13CABG x 2 for CAD presenting with an acute NSTEMI and pre-op EF 30-35%. VS stable.Making slow progress with mobility. ContinuePlavix due to ASA allergy.Continue PT, OT. She has elected to proceed with SNF placement for further rehab prior to planned return to independent living at home. Insurance approval obtained yesterday afternoon, awaiting repeat COVID screen.She is otherwisemedically stable for discharge and will initiate transfe when COVID screen reported.   -Atrial fibrillation--She is maintaining SR with on metoprolol alone.Will continuetoobserve rhythm.  -Volume excess--Wt has been relatively stable past few days.  continue Lasix PO.Recheck BMP in AM.   -Chronic kidney disease, stage 3- Stable renal function. Creat and K+ stable.   -Type 2 diabetes mellitus-Pre-op A1C 6.3. Glucose well controlled.ContinueSSIandmetformin 500mg  po BID.  -Expected acute blood loss anemia--Hctnow trending up.  Iron supplement not ordered due to reported allergy to Fe++ but say she tolerated MVI with iron. Will order and plan to discharge on this.   -Thrombocytopenia-Resolved  -GERD- Continue PPI.  -DVT PPX-Continue enoxaparin.   LOS: 15 days    Malon Kindle [id number] 05/28/2019  I have seen and examined Megan [name] and agree with the above assessment  and plan.  Lilia Argue  Servando Snare MD Beeper [phone] Office [phone] 05/28/2019 12:13 PM

## 2019-05-28 NOTE — Progress Notes (Signed)
Patient started eating lunch and said "I think Im going to throw up" . She vomited. she stated she doesn't know what caused it . Hasn't vomited since admission.

## 2019-05-29 LAB — GLUCOSE, CAPILLARY
Glucose-Capillary: 86 mg/dL (ref 70–99)
Glucose-Capillary: 107 mg/dL — ABNORMAL HIGH (ref 70–99)

## 2019-05-29 NOTE — Plan of Care (Signed)
Poc progressing.  

## 2019-05-29 NOTE — Progress Notes (Signed)
CARDIAC REHAB PHASE I   PRE:  Rate/Rhythm: 82 SR    BP: sitting 98/53    SaO2: 96 RA  MODE:  Ambulation: 200 ft   POST:  Rate/Rhythm: 107 ST    BP: sitting 126/53     SaO2: 95 RA  Pt moving better. Sometimes can stand fairly independently but other times needs a little boost to stand all the way. [employer] with RW, independent. Although she fatigues and arms get tired. Pt given reminders to bear weight in legs. To recliner. Ready for d/c. Gave reminders for moving in the tube and IS.  [id number]   [place], ACSM 05/29/2019 9:16 AM

## 2019-05-29 NOTE — TOC Progression Note (Signed)
Transition of Care [facility]) - Progression Note    Patient Details  Name: Megan [name] MRN: [id number] Date of Birth: 05/24/1946  Transition of Care [facility]) CM/SW [place], [place] Phone Number: 05/29/2019, 2:38 PM  Clinical Narrative:     Patient will DC to: [facility] Date: 05/29/2019 Family Notified: [name],sister Transport [phone] @03 :30pm  RN, patient, and facility notified of DC. Discharge Summary sent to facility. RN given number for [phone], Room 310. Ambulance transport requested for patient @ 3:30pm.   Clinical Social Worker signing off.  Thurmond Butts, MSW, [facility] Clinical Social Worker [phone]    Expected Discharge Plan: IP Rehab Facility Barriers to Discharge: Continued Medical Work up  Expected Discharge Plan and Services Expected Discharge Plan: [place]       Living arrangements for the past 2 months: Single Family Home Expected Discharge Date: 05/28/19                                     Social Determinants of Health (SDOH) Interventions    Readmission Risk Interventions No flowsheet data found.

## 2019-05-29 NOTE — Progress Notes (Signed)
Physical Therapy Treatment Patient Details Name: Megan [name] MRN: [id number] DOB: Mar 01, 1946 Today's Date: 05/29/2019    History of Present Illness Pt is a 73 y.o. female who presented to the ED with c/o chest pain and admitted for NSTEMI. She underwent CABG 05/15/19. PMH consists of recent R ankle fx (12/2018), L knee DJD, OA, HTN, glaucoma, LE cellulitis, DM, depression, anxiety, and asthma.    PT Comments    Pt reporting that she had just recently (within the previous half hour) ambulated with the assistance of cardiac rehab staff. She reported that she was fatigued from that. Pt with great difficulty when attempting to stand from recliner chair even with max-total A. Pt ready to transfer to [facility] but reported that she is still waiting to hear if there is a bed available for her. Pt would continue to benefit from skilled physical therapy services at this time while admitted and after d/c to address the below listed limitations in order to improve overall safety and independence with functional mobility.    Follow Up Recommendations  SNF     Equipment Recommendations  Other (comment)(defer to next venue of care)    Recommendations for Other Services       Precautions / Restrictions Precautions Precautions: Fall;Sternal    Mobility  Bed Mobility               General bed mobility comments: pt OOB in recliner chair upon arrival  Transfers Overall transfer level: Needs assistance Equipment used: Rolling walker (2 wheeled) Transfers: Sit to/from Stand Sit to Stand: Max assist         General transfer comment: pt attempted sit<>stand from recliner chair x5 with max-total A; however, unable to achieve standing or even clear buttocks from surface  Ambulation/Gait                 Stairs             Wheelchair Mobility    Modified Rankin (Stroke Patients Only)       Balance Overall balance assessment: Needs assistance Sitting-balance support:  Feet supported Sitting balance-Leahy Scale: Fair                                      Cognition Arousal/Alertness: Awake/alert Behavior During Therapy: WFL for tasks assessed/performed Overall Cognitive Status: Within Functional Limits for tasks assessed                                        Exercises General Exercises - Lower Extremity Ankle Circles/Pumps: AROM;Both;20 reps;Seated Long Arc Quad: AROM;Strengthening;Both;20 reps;Seated Hip ABduction/ADduction: AROM;Both;20 reps;Seated Hip Flexion/Marching: AROM;Strengthening;Both;20 reps;Seated    General Comments        Pertinent Vitals/Pain Pain Assessment: Faces Faces Pain Scale: Hurts even more Pain Location: bilateral knees Pain Descriptors / Indicators: Grimacing;Guarding Pain Intervention(s): Monitored during session;Repositioned    Home Living                      Prior Function            PT Goals (current goals can now be found in the care plan section) Acute Rehab PT Goals PT Goal Formulation: With patient Time For Goal Achievement: 05/31/19 Potential to Achieve Goals: Good Progress towards PT goals: Progressing toward goals  Frequency    Min 3X/week      PT Plan Current plan remains appropriate    Co-evaluation              AM-PAC PT "6 Clicks" Mobility   Outcome Measure  Help needed turning from your back to your side while in a flat bed without using bedrails?: A Lot Help needed moving from lying on your back to sitting on the side of a flat bed without using bedrails?: A Lot Help needed moving to and from a bed to a chair (including a wheelchair)?: A Lot Help needed standing up from a chair using your arms (e.g., wheelchair or bedside chair)?: A Lot Help needed to walk in hospital room?: A Lot Help needed climbing 3-5 steps with a railing? : Total 6 Click Score: 11    End of Session Equipment Utilized During Treatment: Gait belt Activity  Tolerance: Patient limited by fatigue Patient left: in chair;with call bell/phone within reach Nurse Communication: Mobility status;Precautions PT Visit Diagnosis: Other abnormalities of gait and mobility (R26.89);Muscle weakness (generalized) (M62.81)     Time: [id number] PT Time Calculation (min) (ACUTE ONLY): 24 min  Charges:  $Therapeutic Exercise: 8-22 mins $Therapeutic Activity: 8-22 mins                     Sherie Don, PT, DPT  Acute Rehabilitation Services Pager [phone] Office [place] 05/29/2019, 1:16 PM

## 2019-05-29 NOTE — Progress Notes (Signed)
[address]       [place] [address]             [phone]                 14 Days Post-Op Procedure(s) (LRB): CORONARY ARTERY BYPASS GRAFTING (CABG) times two, left internal mammary artery to left anterior descending artery and left greater saphenous vein to posterior descending artery, left sapheouns vein harvested endoscopically  (N/A) TRANSESOPHAGEAL ECHOCARDIOGRAM (TEE) (N/A)  LOS: 16 days   Subjective: Feels well today, legs less swollen   Objective: Vital signs in last 24 hours: Patient Vitals for the past 24 hrs:  BP Temp Temp src Pulse Resp SpO2 Weight  05/29/19 0441 (!) 118/53 97.8 F (36.6 C) Oral 70 20 95 % 89.6 kg  05/28/19 2302 (!) 102/41 98 F (36.7 C) Oral 84 19 93 % -  05/28/19 2116 - - - 81 - - -  05/28/19 2031 (!) 109/46 97.6 F (36.4 C) Oral 79 20 99 % -  05/28/19 1513 (!) 101/48 - - 81 - 91 % -  05/28/19 1403 (!) 97/51 98.7 F (37.1 C) Oral 85 19 94 % -  05/28/19 0837 (!) 97/53 - - 87 18 97 % -    Filed Weights   05/26/19 0447 05/28/19 0431 05/29/19 0441  Weight: 90.7 kg 88.8 kg 89.6 kg    Hemodynamic parameters for last 24 hours:    Intake/Output from previous day: 09/29 0701 - 09/30 0700 In: 460 [P.O.:460] Out: 200 [Urine:200] Intake/Output this shift: No intake/output data recorded.  Scheduled Meds: . atorvastatin  80 mg Oral q1800  . bisacodyl  10 mg Oral Daily   Or  . bisacodyl  10 mg Rectal Daily  . Chlorhexidine Gluconate Cloth  6 each Topical Daily  . clopidogrel  75 mg Oral Daily  . docusate sodium  200 mg Oral Daily  . enoxaparin (LOVENOX) injection  30 mg Subcutaneous QHS  . furosemide  40 mg Oral Daily  . insulin aspart  0-24 Units Subcutaneous TID AC & HS  . levothyroxine  50 mcg Oral Q0600  . metFORMIN  500 mg Oral BID WC  . metoprolol tartrate  12.5 mg Oral TID   Or  . metoprolol tartrate  12.5 mg Per Tube TID  . moving right along book   Does not apply Once  . multivitamins with iron  1  tablet Oral Daily  . pantoprazole  40 mg Oral Daily  . potassium chloride  20 mEq Oral BID  . sodium chloride flush  3 mL Intravenous Q12H   Continuous Infusions: . sodium chloride     PRN Meds:.sodium chloride, acetaminophen, ondansetron (ZOFRAN) IV, oxyCODONE, sodium chloride flush, traMADol  General appearance: alert, cooperative and no distress Neurologic: intact Heart: regular rate and rhythm, S1, S2 normal, no murmur, click, rub or gallop Lungs: clear to auscultation bilaterally Abdomen: soft, non-tender; bowel sounds normal; no masses,  no organomegaly Extremities: extremities normal, atraumatic, no cyanosis or edema and Homans sign is negative, no sign of DVT Wound: sternum stable wounds healing well with out evidence of infection  Lab Results: CBC: Recent Labs    05/28/19 0232  WBC 8.7  HGB 7.7*  HCT 25.1*  PLT 481*   BMET:  Recent Labs    05/28/19 0232  NA 138  K 4.6  CL 100  CO2 28  GLUCOSE 104*  BUN 14  CREATININE 0.93  CALCIUM 7.4*  PT/INR: No results for input(s): LABPROT, INR in the last 72 hours.   Radiology Dg Chest Port 1 View  Result Date: 05/28/2019 CLINICAL DATA:  Post CABG EXAM: PORTABLE CHEST 1 VIEW COMPARISON:  05/21/2019 FINDINGS: Prior CABG. Cardiomegaly. No pneumothorax. Small bilateral pleural effusions with bibasilar atelectasis, left greater than right. No real change since prior study. IMPRESSION: Small bilateral effusions with bibasilar atelectasis, left greater than right. No pneumothorax. Electronically Signed   By: Rolm Baptise M.D.   On: 05/28/2019 08:28     Assessment/Plan: S/P Procedure(s) (LRB): CORONARY ARTERY BYPASS GRAFTING (CABG) times two, left internal mammary artery to left anterior descending artery and left greater saphenous vein to posterior descending artery, left sapheouns vein harvested endoscopically  (N/A) TRANSESOPHAGEAL ECHOCARDIOGRAM (TEE) (N/A) Waiting for nursing home placement  Negative Covid test on  9/25 and 9/28   Grace Isaac MD 05/29/2019 8:22 AM

## 2019-06-06 ENCOUNTER — Ambulatory Visit (INDEPENDENT_AMBULATORY_CARE_PROVIDER_SITE_OTHER): Payer: Medicare Other | Admitting: Cardiology

## 2019-06-06 ENCOUNTER — Encounter: Payer: Self-pay | Admitting: Cardiology

## 2019-06-06 ENCOUNTER — Other Ambulatory Visit: Payer: Self-pay

## 2019-06-06 VITALS — BP 110/60 | HR 84 | Ht 63.0 in | Wt 180.0 lb

## 2019-06-06 DIAGNOSIS — I5041 Acute combined systolic (congestive) and diastolic (congestive) heart failure: Secondary | ICD-10-CM | POA: Diagnosis not present

## 2019-06-06 DIAGNOSIS — E785 Hyperlipidemia, unspecified: Secondary | ICD-10-CM | POA: Diagnosis not present

## 2019-06-06 DIAGNOSIS — Z951 Presence of aortocoronary bypass graft: Secondary | ICD-10-CM | POA: Diagnosis not present

## 2019-06-06 DIAGNOSIS — I480 Paroxysmal atrial fibrillation: Secondary | ICD-10-CM

## 2019-06-06 DIAGNOSIS — D6489 Other specified anemias: Secondary | ICD-10-CM

## 2019-06-06 DIAGNOSIS — E118 Type 2 diabetes mellitus with unspecified complications: Secondary | ICD-10-CM

## 2019-06-06 MED ORDER — METOPROLOL SUCCINATE ER 25 MG PO TB24: 25.0000 mg | ORAL_TABLET | Freq: Every day | ORAL | 3 refills | Status: DC

## 2019-06-06 MED ORDER — LOSARTAN POTASSIUM 25 MG PO TABS: 12.5000 mg | ORAL_TABLET | Freq: Every day | ORAL | 3 refills | Status: AC

## 2019-06-06 NOTE — Patient Instructions (Addendum)
Medication Instructions:  1.) STOP: Metoprolol Tartrate   2.) START: Losartan 12.5 mg taking 1 tablet by mouth once a day   3.) START: Metoprolol Succinate 25 mg taking 1 tablet by mouth once a day     If you need a refill on your cardiac medications before your next appointment, please call your pharmacy.   Lab work: FUTURE: Your physician recommends that you return for a FASTING lipid profile and hepatic function test on 07/16/2019 (Our lab is open from 7:30 AM to 4:30 PM)   If you have labs (blood work) drawn today and your tests are completely normal, you will receive your results only by: [name] MyChart Message (if you have MyChart) OR . A paper copy in the mail If you have any lab test that is abnormal or we need to change your treatment, we will call you to review the results.  Testing/Procedures: None   Follow-Up: You are scheduled to see Dr. Acie Fredrickson on 07/03/2019 @ 9:20 AM    Any Other Special Instructions Will Be Listed Below (If Applicable).  Lifestyle Modifications to Prevent and Treat Heart Disease -Recommend heart healthy/Mediterranean diet, with whole grains, fruits, vegetables, fish, lean meats, nuts, olive oil and avocado oil.  -Limit salt intake to less than 2000 mg per day.  -Recommend moderate walking, starting slowly with a few minutes and working up to 3-5 times/week for 30-50 minutes  -Recommend avoidance of tobacco products. Avoid excess alcohol. -Keep blood pressure well controlled, ideally less than 130/80.     Mediterranean Diet A Mediterranean diet refers to food and lifestyle choices that are based on the traditions of countries located on the [employer]. This way of eating has been shown to help prevent certain conditions and improve outcomes for people who have chronic diseases, like kidney disease and heart disease. What are tips for following this plan? Lifestyle  Cook and eat meals together with your family, when possible.  Drink enough  fluid to keep your urine clear or pale yellow.  Be physically active every day. This includes: ? Aerobic exercise like running or swimming. ? Leisure activities like gardening, walking, or housework.  Get 7-8 hours of sleep each night.  If recommended by your health care provider, drink red wine in moderation. This means 1 glass a day for nonpregnant women and 2 glasses a day for men. A glass of wine equals 5 oz (150 mL). Reading food labels   Check the serving size of packaged foods. For foods such as rice and pasta, the serving size refers to the amount of cooked product, not dry.  Check the total fat in packaged foods. Avoid foods that have saturated fat or trans fats.  Check the ingredients list for added sugars, such as corn syrup. Shopping  At the grocery store, buy most of your food from the areas near the walls of the store. This includes: ? Fresh fruits and vegetables (produce). ? Grains, beans, nuts, and seeds. Some of these may be available in unpackaged forms or large amounts (in bulk). ? Fresh seafood. ? Poultry and eggs. ? Low-fat dairy products.  Buy whole ingredients instead of prepackaged foods.  Buy fresh fruits and vegetables in-season from local farmers markets.  Buy frozen fruits and vegetables in resealable bags.  If you do not have access to quality fresh seafood, buy precooked frozen shrimp or canned fish, such as tuna, salmon, or sardines.  Buy small amounts of raw or cooked vegetables, salads, or olives from the  deli or salad bar at your store.  Stock your pantry so you always have certain foods on hand, such as olive oil, canned tuna, canned tomatoes, rice, pasta, and beans. Cooking  Cook foods with extra-virgin olive oil instead of using butter or other vegetable oils.  Have meat as a side dish, and have vegetables or grains as your main dish. This means having meat in small portions or adding small amounts of meat to foods like pasta or  stew.  Use beans or vegetables instead of meat in common dishes like chili or lasagna.  Experiment with different cooking methods. Try roasting or broiling vegetables instead of steaming or sauteing them.  Add frozen vegetables to soups, stews, pasta, or rice.  Add nuts or seeds for added healthy fat at each meal. You can add these to yogurt, salads, or vegetable dishes.  Marinate fish or vegetables using olive oil, lemon juice, garlic, and fresh herbs. Meal planning   Plan to eat 1 vegetarian meal one day each week. Try to work up to 2 vegetarian meals, if possible.  Eat seafood 2 or more times a week.  Have healthy snacks readily available, such as: ? Vegetable sticks with hummus. ? [place] yogurt. ? Fruit and nut trail mix.  Eat balanced meals throughout the week. This includes: ? Fruit: 2-3 servings a day ? Vegetables: 4-5 servings a day ? Low-fat dairy: 2 servings a day ? Fish, poultry, or lean meat: 1 serving a day ? Beans and legumes: 2 or more servings a week ? Nuts and seeds: 1-2 servings a day ? Whole grains: 6-8 servings a day ? Extra-virgin olive oil: 3-4 servings a day  Limit red meat and sweets to only a few servings a month What are my food choices?  Mediterranean diet ? Recommended  Grains: Whole-grain pasta. Brown rice. Bulgar wheat. Polenta. Couscous. Whole-wheat bread. [place].  Vegetables: Artichokes. Beets. Broccoli. Cabbage. Carrots. Eggplant. Green beans. Chard. Kale. Spinach. Onions. Leeks. Peas. Squash. Tomatoes. Peppers. Radishes.  Fruits: Apples. Apricots. Avocado. Berries. Bananas. Cherries. Dates. Figs. Grapes. Lemons. Melon. Oranges. Peaches. Plums. Pomegranate.  Meats and other protein foods: Beans. Almonds. Sunflower seeds. Pine nuts. Peanuts. Fifty[place]Six. Salmon. Scallops. Shrimp. [name]. Tilapia. Clams. Oysters. Eggs.  Dairy: Low-fat milk. Cheese. Greek yogurt.  Beverages: Water. Red wine. Herbal tea.  Fats and oils: Extra virgin olive  oil. Avocado oil. Grape seed oil.  Sweets and desserts: [place] yogurt with honey. Baked apples. Poached pears. Trail mix.  Seasoning and other foods: Basil. Cilantro. Coriander. Cumin. Mint. Parsley. Sage. Rosemary. Tarragon. Garlic. Oregano. Thyme. Pepper. Balsalmic vinegar. Tahini. Hummus. Tomato sauce. Olives. Mushrooms. ? Limit these  Grains: Prepackaged pasta or rice dishes. Prepackaged cereal with added sugar.  Vegetables: Deep fried potatoes (french fries).  Fruits: Fruit canned in syrup.  Meats and other protein foods: Beef. Pork. Lamb. Poultry with skin. Hot dogs. Berniece Salines.  Dairy: Ice cream. Sour cream. Whole milk.  Beverages: Juice. Sugar-sweetened soft drinks. Beer. Liquor and spirits.  Fats and oils: Butter. Canola oil. Vegetable oil. Beef fat (tallow). Lard.  Sweets and desserts: Cookies. Cakes. Pies. Candy.  Seasoning and other foods: Mayonnaise. Premade sauces and marinades. The items listed may not be a complete list. Talk with your dietitian about what dietary choices are right for you. Summary  The Mediterranean diet includes both food and lifestyle choices.  Eat a variety of fresh fruits and vegetables, beans, nuts, seeds, and whole grains.  Limit the amount of red meat and sweets that  you eat.  Talk with your health care provider about whether it is safe for you to drink red wine in moderation. This means 1 glass a day for nonpregnant women and 2 glasses a day for men. A glass of wine equals 5 oz (150 mL). This information is not intended to replace advice given to you by your health care provider. Make sure you discuss any questions you have with your health care provider. Document Released: 04/07/2016 Document Revised: 04/14/2016 Document Reviewed: 04/07/2016 Elsevier Patient Education  2020 [employer].

## 2019-06-06 NOTE — Progress Notes (Signed)
Cardiology Office Note:    Date:  06/06/2019   ID:  Megan [name], DOB March 02, 1946, MRN [id number]  PCP:  [name], MD  Cardiologist:  Mertie Moores, MD  Referring MD: [name], MD   Chief Complaint  Patient presents with  . Hospitalization Follow-up    CABG    History of Present Illness:    Megan [name] is a 73 y.o. 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  Her husband had recently passed away 03-04-2018 and she had been under a lot of stress at home and in her personal life.  She also had been treated for bilateral cellulitis earlier this year and a few months back she broke her ankle and tore a ligament in her knee.  She had gone to rehab and was recently released in August.    At the ED EKG showed new left bundle branch block.  High-sensitivity troponin rose to 3549.  She underwent cardiac catheterization on 05/13/2019 that showed ostial LAD stenosis, 65% RCA distal stenosis and estimated EF 35-55%.  Echocardiogram showed EF 25-30%.  She ended up having a two-vessel CABG on 05/15/2019.  She developed postop atrial fibrillation and was started on amiodarone.  She was started on Plavix postoperatively due to history of anaphylaxis associated with aspirin.  She made slow progress and was discharged to SNF for rehab.  Blood pressure was low in the hospital.  Plan to add ACE inhibitor/ARB as outpatient as she tolerates.  Ms Spicher is here today for hospital follow up. She is at [facility] for rehab then going home to her house. She usually lives alone. She has been healing well. Breathing is doing well except when she has anxiety issues.  She continues to use IS 650-700. She walked 400 ft yesterday with PT. Working wth OT on ADLs.   Wt down  significantly.  She says that she has lost about 80 pounds since January.  She sleeps on a flat bed.  She has mild chest soreness but no substernal chest pain.  No orthopnea, PND.  She continues to have 1+ bilateral lower leg edema which she says is greatly improved since prior to her surgery.  Sternal incision is healing well.  She had 2 remaining chest tube sutures which I have now removed.  She has no redness or drainage from any of her sites.   Cardiac studies   2D Echo 05-13-2019: IMPRESSIONS  1. The left ventricle has severely reduced systolic function, with an ejection fraction of 25-30%. The cavity size was normal. There is mildly increased left ventricular wall thickness. Left ventricular diastolic Doppler parameters are consistent with  impaired relaxation. Mid to apical anteroseptal and inferoseptal akinesis. Apical anterior, apical lateral, and apical inferior akinesis. Akinesis of the true apex. No LV thrombus noted. 2. Normal RV size with mildly decreased systolic function. 3. There is mild mitral annular calcification present. No evidence of mitral valve stenosis. Trivial mitral regurgitation. 4. The aortic root is normal in size and structure. 5. The aortic valve is tricuspid. No stenosis of the aortic valve. 6. Left atrial size was mildly dilated. 7. The IVC was normal in size with PA systolic pressure 24 mmHg.  LEFT HEART CATH AND CORONARY ANGIOGRAPHY 05/13/2019  Conclusion   Mid LM lesion is 30% stenosed.  Ost LAD  lesion is 95% stenosed.  Prox RCA to Mid RCA lesion is 45% stenosed.  Dist RCA lesion is 65% stenosed.  There is moderate left ventricular systolic dysfunction.  LV end diastolic pressure is moderately elevated.  The left ventricular ejection fraction is 35-45% by visual estimate.  1. Critical ostial LAD stenosis. Heavily calcified.  2. Moderate distal RCA disease 3. Moderate LV dysfunction with anteroapical wall motion abnormality 4.  Moderately elevated LVEDP  Plan: LAD stenosis is poorly suited for PCI with heavy calcification and ostial location. Recommend CT surgery consultation. Will hold Plavix. Since patient is ASA allergic will start Aggrastat IV per pharmacy. Resume IV heparin in 8 hours.       Past Medical History:  Diagnosis Date  . Allergy    bee stings  . Anxiety   . Asthmatic bronchitis   . Broken arm 1957/2008   right  . Cataract    had surgery   . Depression   . Diabetes mellitus without complication ([place])    prediabetes diet controlled  . Diverticulosis   . GERD (gastroesophageal reflux disease)   . Glaucoma    BIL  . Hemorrhoids   . Hx of adenomatous colonic polyps 12/02/2010  . Hyperlipidemia   . Hypertension   . Hypothyroidism 05/2018  . Inflammatory arthritis   . Knee bursitis   . Migraines   . Obesity   . Osteoarthritis   . Sleep apnea     Past Surgical History:  Procedure Laterality Date  . CARPAL TUNNEL RELEASE Left    left wrist  . CATARACT EXTRACTION W/ INTRAOCULAR LENS  IMPLANT, BILATERAL Bilateral    1999 left, 2008 right  . COLONOSCOPY    . CORONARY ARTERY BYPASS GRAFT N/A 05/15/2019   Procedure: CORONARY ARTERY BYPASS GRAFTING (CABG) times two, left internal mammary artery to left anterior descending artery and left greater saphenous vein to posterior descending artery, left sapheouns vein harvested endoscopically ;  Surgeon: Grace Isaac, MD;  Location: [place];  Service: Open Heart Surgery;  Laterality: N/A;  . EXCISION MORTON'S NEUROMA Left 1978   left foot  . GLAUCOMA SURGERY Bilateral    and laser surgery, 2004 left, 2008 right  . LEFT HEART CATH AND CORONARY ANGIOGRAPHY N/A 05/13/2019   Procedure: LEFT HEART CATH AND CORONARY ANGIOGRAPHY;  Surgeon: Martinique, Peter M, MD;  Location: [place] [facility];  Service: Cardiovascular;  Laterality: N/A;  . PARTIAL HYSTERECTOMY  1991   Left an ovary  . POLYPECTOMY    . TEE WITHOUT CARDIOVERSION N/A 05/15/2019    Procedure: TRANSESOPHAGEAL ECHOCARDIOGRAM (TEE);  Surgeon: Grace Isaac, MD;  Location: [place];  Service: Open Heart Surgery;  Laterality: N/A;  . TOENAIL AVULSION Right    big toe    Current Medications: Current Meds  Medication Sig  . Acetaminophen (TYLENOL ARTHRITIS PAIN PO) Take 650 mg by mouth every 8 (eight) hours.   [name] allopurinol (ZYLOPRIM) 100 MG tablet Take 1 tablet (100 mg total) by mouth daily.  [name] atorvastatin (LIPITOR) 80 MG tablet Take 1 tablet (80 mg total) by mouth daily at 6 PM.  . clopidogrel (PLAVIX) 75 MG tablet Take 1 tablet (75 mg total) by mouth daily.  . diclofenac sodium (VOLTAREN) 1 % GEL Apply 2 gm topically to left knee  . Ensure (ENSURE) Take 237 mLs by mouth as needed.  . furosemide (LASIX) 40 MG tablet Take 1 tablet (40 mg total) by mouth daily.  [name] levothyroxine (SYNTHROID) 50 MCG tablet Take  1 tablet (50 mcg total) by mouth daily before breakfast.  . Lidocaine (ASPERCREME LIDOCAINE) 4 % PTCH Place 1 patch onto the skin daily. APPLY TOPICALLY TO LOWER BACK ONCE DAILY FOR PAIN  . Melatonin 3 MG TABS Take 1 tablet (3 mg total) by mouth at bedtime. FOR INSOMNIA  . metFORMIN (GLUCOPHAGE) 500 MG tablet Take 1 tablet (500 mg total) by mouth 2 (two) times daily with a meal.  . Multiple Vitamins-Iron (MULTIVITAMINS WITH IRON) TABS tablet Take 1 tablet by mouth daily.  . ondansetron (ZOFRAN) 4 MG tablet Take 1 tablet (4 mg total) by mouth every 6 (six) hours as needed for nausea or vomiting. GIVE 1 TABLET BY MOUTH EVERY 6 HOURS AS NEEDED FOR NAUSEA/VOMITING  . ONETOUCH VERIO test strip USE TO TEST BLOOD GLUCOSE ONCE DAILY  . pantoprazole (PROTONIX) 40 MG tablet Take 1 tablet (40 mg total) by mouth daily.  . polyvinyl alcohol (LIQUIFILM TEARS) 1.4 % ophthalmic solution Place 1 drop into both eyes every 4 (four) hours as needed for dry eyes.  . potassium chloride SA (K-DUR) 20 MEQ tablet Take 1 tablet (20 mEq total) by mouth daily. While taking Lasix (furosemide).  .  vitamin B-12 (CYANOCOBALAMIN) 1000 MCG tablet 1 tab by orally daily  . [DISCONTINUED] metoprolol tartrate (LOPRESSOR) 25 MG tablet Take 0.5 tablets (12.5 mg total) by mouth 2 (two) times daily.     Allergies:   Aspirin, Bee pollen, Codeine, Fish oil, Sulfa antibiotics, Iron, Camphor, Camphor, Lisinopril, Penicillins, Sulfasalazine, and Vicodin [hydrocodone-acetaminophen]   Social History   Socioeconomic History  . Marital status: Married    Spouse name: Lynnae Sandhoff  . Number of children: 0  . Years of education: Not on file  . Highest education level: Associate degree: occupational, Hotel manager, or vocational program  Occupational History  . Occupation: retired  Scientific laboratory technician  . Financial resource strain: Not on file  . Food insecurity    Worry: Not on file    Inability: Not on file  . Transportation needs    Medical: Not on file    Non-medical: Not on file  Tobacco Use  . Smoking status: Former Smoker    Types: Cigarettes    Quit date: 08/30/1971    Years since quitting: 47.8  . Smokeless tobacco: Never Used  Substance and Sexual Activity  . Alcohol use: No    Alcohol/week: 0.0 standard drinks  . Drug use: No  . Sexual activity: Not on file  Lifestyle  . Physical activity    Days per week: Not on file    Minutes per session: Not on file  . Stress: Not on file  Relationships  . Social Herbalist on phone: Not on file    Gets together: Not on file    Attends religious service: Not on file    Active member of club or organization: Not on file    Attends meetings of clubs or organizations: Not on file    Relationship status: Not on file  Other Topics Concern  . Not on file  Social History Narrative   She is married, no children, retired.  Lives with husband in a one story home.  Retired from Geologist, engineering for [employer].     Family History: The patient's family history includes Asthma in her brother and sister; Bone cancer in her maternal grandfather; Breast cancer in  her brother; COPD in her sister; Clotting disorder in her sister; Colon polyps in her maternal grandfather; Diabetes in her  brother, father, mother, and sister; Heart Problems in her father; Hypertension in her father and mother; Obesity in an other family member; Parkinson's disease in her mother. There is no history of Rectal cancer, Stomach cancer, or Esophageal cancer. ROS:   Please see the history of present illness.     All other systems reviewed and are negative.   EKG:  EKG is not ordered today.   Recent Labs: 09/08/2018: B Natriuretic Peptide 112.0 09/09/2018: TSH 3.265 05/13/2019: ALT 14 05/16/2019: Magnesium 2.0 05/28/2019: BUN 14; Creatinine, Ser 0.93; Hemoglobin 7.7; Platelets 481; Potassium 4.6; Sodium 138   Recent Lipid Panel No results found for: CHOL, TRIG, HDL, CHOLHDL, VLDL, LDLCALC, LDLDIRECT  Physical Exam:    VS:  BP 110/60   Pulse 84   Ht 5\' 3"  (1.6 m)   Wt 180 lb (81.6 kg)   SpO2 98%   BMI 31.89 kg/m     Wt Readings from Last 6 Encounters:  06/06/19 180 lb (81.6 kg)  05/29/19 197 lb 8.5 oz (89.6 kg)  04/12/19 180 lb (81.6 kg)  04/02/19 189 lb (85.7 kg)  04/01/19 188 lb (85.3 kg)  03/29/19 189 lb 12.8 oz (86.1 kg)     Physical Exam  Constitutional: She is oriented to person, place, and time. She appears well-developed and well-nourished. No distress.  HENT:  Head: Normocephalic and atraumatic.  Neck: Normal range of motion. Neck supple. No JVD present.  Cardiovascular: Normal rate, regular rhythm, normal heart sounds and intact distal pulses. Exam reveals no gallop and no friction rub.  No murmur heard. Pulmonary/Chest: Effort normal and breath sounds normal. No respiratory distress. She has no wheezes. She has no rales.  Abdominal: Soft. Bowel sounds are normal.  Musculoskeletal: Normal range of motion.        General: Edema present.     Comments: 1+ lower leg edema bilaterally  Neurological: She is alert and oriented to person, place, and time.   Skin: Skin is warm and dry.  Psychiatric: She has a normal mood and affect. Her behavior is normal. Judgment and thought content normal.  Vitals reviewed.    ASSESSMENT:    1. S/P CABG x 2   2. Acute combined systolic and diastolic heart failure (HCC)   3. Paroxysmal atrial fibrillation ([place])   4. Hyperlipidemia LDL goal <70   5. DM (diabetes mellitus), type 2 with complications ([place])   6. Anemia due to other cause, not classified    PLAN:    In order of problems listed above:  CAD S/P CABG -S/P NSTEMI and subsequent CABG X2. -Patient on Plavix due to history of anaphylaxis with aspirin, statin, beta-blocker. -She is at [facility] for rehab and progressing slowly.  She hopes to return home after rehab.  Working with physical therapy and Occupational Therapy. -Incisions healing well.  Chest tube sutures removed. -Still has lower extremity edema.  Continue Lasix. -We discussed lifestyle modifications including heart healthy diet, exercise.  Acute systolic heart failure -EF down to 25-30% on echo during hospitalization for non-STEMI. -On low-dose beta-blocker, metoprolol tartrate 12.5 mg twice daily -Discharged on Lasix 40 mg daily.  Still has lower leg edema but patient says is much better than prior to the hospital.  Continue Lasix. -Blood pressure is stable today.  Patient reports systolic blood pressure in the 110's at rehab.  Will add losartan 12.5 mg daily.  Instructions to rehab to discontinue if blood pressure goes below 100.  Had cough with ACE-I in the past.  -Switch  to toprol XL 25mg   Atrial fibrillation, postop -Initially controlled by amiodarone postoperatively.  Amiodarone has been discontinued and she is maintaining sinus rhythm.  Hyperlipidemia -On high intensity statin with atorvastatin 80 mg daily -Will recheck FLP and LFTs in mid November  Diabetes type 2 -On metformin.  6.3.  Adequately controlled.    Anemia -Patient had postop anemia.  Hemoglobin 7.7 at  discharge. -History of anemia with hemoglobin 8-10 earlier this year.  Medication Adjustments/Labs and Tests Ordered: Current medicines are reviewed at length with the patient today.  Concerns regarding medicines are outlined above. Labs and tests ordered and medication changes are outlined in the patient instructions below:  Patient Instructions  Medication Instructions:  1.) STOP: Metoprolol Tartrate   2.) START: Losartan 12.5 mg taking 1 tablet by mouth once a day   3.) START: Metoprolol Succinate 25 mg taking 1 tablet by mouth once a day     If you need a refill on your cardiac medications before your next appointment, please call your pharmacy.   Lab work: FUTURE: Your physician recommends that you return for a FASTING lipid profile and hepatic function test on 07/16/2019 (Our lab is open from 7:30 AM to 4:30 PM)   If you have labs (blood work) drawn today and your tests are completely normal, you will receive your results only by: [name] MyChart Message (if you have MyChart) OR . A paper copy in the mail If you have any lab test that is abnormal or we need to change your treatment, we will call you to review the results.  Testing/Procedures: None   Follow-Up: You are scheduled to see Dr. Acie Fredrickson on 07/03/2019 @ 9:20 AM    Any Other Special Instructions Will Be Listed Below (If Applicable).  Lifestyle Modifications to Prevent and Treat Heart Disease -Recommend heart healthy/Mediterranean diet, with whole grains, fruits, vegetables, fish, lean meats, nuts, olive oil and avocado oil.  -Limit salt intake to less than 2000 mg per day.  -Recommend moderate walking, starting slowly with a few minutes and working up to 3-5 times/week for 30-50 minutes  -Recommend avoidance of tobacco products. Avoid excess alcohol. -Keep blood pressure well controlled, ideally less than 130/80.     Mediterranean Diet A Mediterranean diet refers to food and lifestyle choices that are based on the  traditions of countries located on the [employer]. This way of eating has been shown to help prevent certain conditions and improve outcomes for people who have chronic diseases, like kidney disease and heart disease. What are tips for following this plan? Lifestyle  Cook and eat meals together with your family, when possible.  Drink enough fluid to keep your urine clear or pale yellow.  Be physically active every day. This includes: ? Aerobic exercise like running or swimming. ? Leisure activities like gardening, walking, or housework.  Get 7-8 hours of sleep each night.  If recommended by your health care provider, drink red wine in moderation. This means 1 glass a day for nonpregnant women and 2 glasses a day for men. A glass of wine equals 5 oz (150 mL). Reading food labels   Check the serving size of packaged foods. For foods such as rice and pasta, the serving size refers to the amount of cooked product, not dry.  Check the total fat in packaged foods. Avoid foods that have saturated fat or trans fats.  Check the ingredients list for added sugars, such as corn syrup. Shopping  At the grocery store, buy  most of your food from the areas near the walls of the store. This includes: ? Fresh fruits and vegetables (produce). ? Grains, beans, nuts, and seeds. Some of these may be available in unpackaged forms or large amounts (in bulk). ? Fresh seafood. ? Poultry and eggs. ? Low-fat dairy products.  Buy whole ingredients instead of prepackaged foods.  Buy fresh fruits and vegetables in-season from local farmers markets.  Buy frozen fruits and vegetables in resealable bags.  If you do not have access to quality fresh seafood, buy precooked frozen shrimp or canned fish, such as tuna, salmon, or sardines.  Buy small amounts of raw or cooked vegetables, salads, or olives from the deli or salad bar at your store.  Stock your pantry so you always have certain foods on hand,  such as olive oil, canned tuna, canned tomatoes, rice, pasta, and beans. Cooking  Cook foods with extra-virgin olive oil instead of using butter or other vegetable oils.  Have meat as a side dish, and have vegetables or grains as your main dish. This means having meat in small portions or adding small amounts of meat to foods like pasta or stew.  Use beans or vegetables instead of meat in common dishes like chili or lasagna.  Experiment with different cooking methods. Try roasting or broiling vegetables instead of steaming or sauteing them.  Add frozen vegetables to soups, stews, pasta, or rice.  Add nuts or seeds for added healthy fat at each meal. You can add these to yogurt, salads, or vegetable dishes.  Marinate fish or vegetables using olive oil, lemon juice, garlic, and fresh herbs. Meal planning   Plan to eat 1 vegetarian meal one day each week. Try to work up to 2 vegetarian meals, if possible.  Eat seafood 2 or more times a week.  Have healthy snacks readily available, such as: ? Vegetable sticks with hummus. ? [place] yogurt. ? Fruit and nut trail mix.  Eat balanced meals throughout the week. This includes: ? Fruit: 2-3 servings a day ? Vegetables: 4-5 servings a day ? Low-fat dairy: 2 servings a day ? Fish, poultry, or lean meat: 1 serving a day ? Beans and legumes: 2 or more servings a week ? Nuts and seeds: 1-2 servings a day ? Whole grains: 6-8 servings a day ? Extra-virgin olive oil: 3-4 servings a day  Limit red meat and sweets to only a few servings a month What are my food choices?  Mediterranean diet ? Recommended  Grains: Whole-grain pasta. Brown rice. Bulgar wheat. Polenta. Couscous. Whole-wheat bread. [place].  Vegetables: Artichokes. Beets. Broccoli. Cabbage. Carrots. Eggplant. Green beans. Chard. Kale. Spinach. Onions. Leeks. Peas. Squash. Tomatoes. Peppers. Radishes.  Fruits: Apples. Apricots. Avocado. Berries. Bananas. Cherries. Dates.  Figs. Grapes. Lemons. Melon. Oranges. Peaches. Plums. Pomegranate.  Meats and other protein foods: Beans. Almonds. Sunflower seeds. Pine nuts. Peanuts. [place]. Salmon. Scallops. Shrimp. [place]. Tilapia. Clams. Oysters. Eggs.  Dairy: Low-fat milk. Cheese. Greek yogurt.  Beverages: Water. Red wine. Herbal tea.  Fats and oils: Extra virgin olive oil. Avocado oil. Grape seed oil.  Sweets and desserts: [place] yogurt with honey. Baked apples. Poached pears. Trail mix.  Seasoning and other foods: Basil. Cilantro. Coriander. Cumin. Mint. Parsley. Sage. Rosemary. Tarragon. Garlic. Oregano. Thyme. Pepper. Balsalmic vinegar. Tahini. Hummus. Tomato sauce. Olives. Mushrooms. ? Limit these  Grains: Prepackaged pasta or rice dishes. Prepackaged cereal with added sugar.  Vegetables: Deep fried potatoes (french fries).  Fruits: Fruit canned in syrup.  Meats and  other protein foods: Beef. Pork. Lamb. Poultry with skin. Hot dogs. Berniece Salines.  Dairy: Ice cream. Sour cream. Whole milk.  Beverages: Juice. Sugar-sweetened soft drinks. Beer. Liquor and spirits.  Fats and oils: Butter. Canola oil. Vegetable oil. Beef fat (tallow). Lard.  Sweets and desserts: Cookies. Cakes. Pies. Candy.  Seasoning and other foods: Mayonnaise. Premade sauces and marinades. The items listed may not be a complete list. Talk with your dietitian about what dietary choices are right for you. Summary  The Mediterranean diet includes both food and lifestyle choices.  Eat a variety of fresh fruits and vegetables, beans, nuts, seeds, and whole grains.  Limit the amount of red meat and sweets that you eat.  Talk with your health care provider about whether it is safe for you to drink red wine in moderation. This means 1 glass a day for nonpregnant women and 2 glasses a day for men. A glass of wine equals 5 oz (150 mL). This information is not intended to replace advice given to you by your health care provider. Make sure you discuss any  questions you have with your health care provider. Document Released: 04/07/2016 Document Revised: 04/14/2016 Document Reviewed: 04/07/2016 Elsevier Patient Education  2020 [place], Daune Perch, NP  06/07/2019 4:53 AM    [place]

## 2019-06-12 ENCOUNTER — Other Ambulatory Visit: Payer: Self-pay | Admitting: Cardiothoracic Surgery

## 2019-06-12 DIAGNOSIS — Z951 Presence of aortocoronary bypass graft: Secondary | ICD-10-CM

## 2019-06-13 ENCOUNTER — Other Ambulatory Visit: Payer: Self-pay

## 2019-06-13 ENCOUNTER — Ambulatory Visit
Admission: RE | Admit: 2019-06-13 | Discharge: 2019-06-13 | Disposition: A | Discharge status: Home / Self Care | Payer: Medicare Other | Source: Ambulatory Visit | Attending: Cardiothoracic Surgery | Admitting: Cardiothoracic Surgery

## 2019-06-13 ENCOUNTER — Encounter: Payer: Self-pay | Admitting: Cardiothoracic Surgery

## 2019-06-13 ENCOUNTER — Ambulatory Visit (INDEPENDENT_AMBULATORY_CARE_PROVIDER_SITE_OTHER): Payer: Self-pay | Admitting: Cardiothoracic Surgery

## 2019-06-13 VITALS — BP 113/62 | HR 95 | Temp 97.3°F | Resp 16 | Ht 63.0 in | Wt 180.0 lb

## 2019-06-13 DIAGNOSIS — Z951 Presence of aortocoronary bypass graft: Secondary | ICD-10-CM

## 2019-06-13 DIAGNOSIS — I2510 Atherosclerotic heart disease of native coronary artery without angina pectoris: Secondary | ICD-10-CM

## 2019-06-13 DIAGNOSIS — I214 Non-ST elevation (NSTEMI) myocardial infarction: Secondary | ICD-10-CM

## 2019-06-13 NOTE — Patient Instructions (Signed)
    [address]       [place] [address]             [phone]       Coronary Artery Bypass Grafting  Care After  Refer to this sheet in the next few weeks. These instructions provide you with information on caring for yourself after your procedure. Your caregiver may also give you more specific instructions. Your treatment has been planned according to current medical practices, but problems sometimes occur. Call your caregiver if you have any problems or questions after your procedure.  Recovery from open heart surgery will be different for everyone. Some people feel well after 3 or 4 weeks, while for others it takes longer. After heart surgery, it may be normal to:  Not have an appetite, feel nauseated by the smell of food, or only want to eat a small amount.   Be constipated because of changes in your diet, activity, and medicines. Eat foods high in fiber. Add fresh fruits and vegetables to your diet. Stool softeners may be helpful.   Feel sad or unhappy. You may be frustrated or cranky. You may have good days and bad days. Do not give up. Talk to your caregiver if you do not feel better.   Feel weakness and fatigue. You many need physical therapy or cardiac rehabilitation to get your strength back.   Develop an irregular heartbeat called atrial fibrillation. Symptoms of atrial fibrillation are a fast, irregular heartbeat or feelings of fluttery heartbeats, shortness of breath, low blood pressure, and dizziness. If these symptoms develop, see your caregiver right away.  MEDICATION  Have a list of all the medicines you will be taking when you leave the hospital. For every medicine, know the following:   Name.   Exact dose.   Time of day to be taken.   How often it should be taken.   Why you are taking it.   Ask which medicines should or should not be taken together. If you take more than one heart medicine, ask if it is okay to take them together. Some  heart medicines should not be taken at the same time because they may lower your blood pressure too much.   Narcotic pain medicine can cause constipation. Eat fresh fruits and vegetables. Add fiber to your diet. Stool softener medicine may help relieve constipation.   Keep a copy of your medicines with you at all times.   Do not add or stop taking any medicine until you check with your caregiver.   Medicines can have side effects. Call your caregiver who prescribed the medicine if you:   Start throwing up, have diarrhea, or have stomach pain.   Feel dizzy or lightheaded when you stand up.   Feel your heart is skipping beats or is beating too fast or too slow.   Develop a rash.   Notice unusual bruising or bleeding.  HOME CARE INSTRUCTIONS  After heart surgery, it is important to learn how to take your pulse. Have your caregiver show you how to take your pulse.   Use your incentive spirometer. Ask your caregiver how long after surgery you need to use it.  Care of your chest incision  Tell your caregiver right away if you notice clicking in your chest (sternum).   Support your chest with a pillow or your arms when you take deep breaths and cough.   Follow your caregiver's instructions about when you can bathe or   swim.   Protect your incision from sunlight during the first year to keep the scar from getting dark.   Tell your caregiver if you notice:   Increased tenderness of your incision.   Increased redness or swelling around your incision.   Drainage or pus from your incision.  Care of your leg incision(s)  Avoid crossing your legs.   Avoid sitting for long periods of time. Change positions every half hour.   Elevate your leg(s) when you are sitting.   Check your leg(s) daily for swelling. Check the incisions for redness or drainage.   Diet is very important to heart health.   Eat plenty of fresh fruits and vegetables. Meats should be lean cut. Avoid canned,  processed, and fried foods.   Talk to a dietician. They can teach you how to make healthy food and drink choices.  Weight  Weigh yourself every day. This is important because it helps to know if you are retaining fluid that may make your heart and lungs work harder.   Use the same scale each time.   Weigh yourself every morning at the same time. You should do this after you go to the bathroom, but before you eat breakfast.   Your weight will be more accurate if you do not wear any clothes.   Record your weight.   Tell your caregiver if you have gained 2 pounds or more overnight.  Activity Stop any activity at once if you have chest pain, shortness of breath, irregular heartbeats, or dizziness. Get help right away if you have any of these symptoms.  Bathing.  Avoid soaking in a bath or hot tub until your incisions are healed.   Rest. You need a balance of rest and activity.   Exercise. Exercise per your caregiver's advice. You may need physical therapy or cardiac rehabilitation to help strengthen your muscles and build your endurance.   Climbing stairs. Unless your caregiver tells you not to climb stairs, go up stairs slowly and rest if you tire. Do not pull yourself up by the handrail.   Driving a car. Follow your caregiver's advice on when you may drive. You may ride as a passenger at any time. When traveling for long periods of time in a car, get out of the car and walk around for a few minutes every 2 hours.   Lifting. Avoid lifting, pushing, or pulling anything heavier than 10 pounds for 6 weeks after surgery or as told by your caregiver.   Returning to work. Check with your caregiver. People heal at different rates. Most people will be able to go back to work 6 to 12 weeks after surgery.   Sexual activity. You may resume sexual relations as told by your caregiver.  SEEK MEDICAL CARE IF:  Any of your incisions are red, painful, or have any type of drainage coming from them.     You have an oral temperature above 101.5 F .   You have ankle or leg swelling.   You have pain in your legs.   You have weight gain of 2 or more pounds a day.   You feel dizzy or lightheaded when you stand up.  SEEK IMMEDIATE MEDICAL CARE IF:  You have angina or chest pain that goes to your jaw or arms. Call your local emergency services right away.   You have shortness of breath at rest or with activity.   You have a fast or irregular heartbeat (arrhythmia).   There is   a "clicking" in your sternum when you move.   You have numbness or weakness in your arms or legs.  MAKE SURE YOU:  Understand these instructions.   Will watch your condition.   Will get help right away if you are not doing well or get worse.    No lifting over 25 lbs for 3 months 

## 2019-06-13 NOTE — Progress Notes (Signed)
[address]       [place] [address]             [phone]      [facility] Medical Record [id number] Date of Birth: Jun 28, 1946  Referring: Nahser, Wonda Cheng, MD Primary Care: [name], MD Primary Cardiologist: Mertie Moores, MD   Chief Complaint:   POST OP FOLLOW UP OPERATIVE REPORT DATE OF PROCEDURE:  05/15/2019 PREOPERATIVE DIAGNOSES:  Recent myocardial infarction with decreased left ventricular function 35% and severe 2-vessel coronary artery disease. POSTOPERATIVE DIAGNOSES:  Recent myocardial infarction with decreased left ventricular function 35% and severe 2-vessel coronary artery disease with previous history of pericarditis. PROCEDURE PERFORMED:  Coronary artery bypass grafting x2 with the left internal mammary to the left anterior descending coronary artery and reverse saphenous vein graft to the distal right coronary artery with left greater saphenous thigh endoscopic vein  harvesting.  History of Present Illness:     Patient comes from [facility] in follow-up after coronary artery bypass done September 16.  Preoperatively the patient had difficulty with mobility.  She notes that at clamps she has difficulty in transfers so was not able to pull to live independently at this time.   Patient with previous history of diastolic congestive heart failure polyneuropathy hypertension GERD's chronic kidney disease stage III hypothyroidism, she was treated for bilateral cellulitis earlier in 2020, fell in the late spring broke her ankle and tore ligaments in her knee, was ultimately discharged to a nursing home being released in August.  On September 14 she presented to the [facility] emergency room with chest pain, troponins rose to 3549 heart catheterization showed a depressed ejection fraction 25 to 30% with severe ostial LAD lesion.     Past Medical History:  Diagnosis Date  . Allergy    bee stings  . Anxiety   . Asthmatic  bronchitis   . Broken arm 1957/2008   right  . Cataract    had surgery   . Depression   . Diabetes mellitus without complication ([place])    prediabetes diet controlled  . Diverticulosis   . GERD (gastroesophageal reflux disease)   . Glaucoma    BIL  . Hemorrhoids   . Hx of adenomatous colonic polyps 12/02/2010  . Hyperlipidemia   . Hypertension   . Hypothyroidism 05/2018  . Inflammatory arthritis   . Knee bursitis   . Migraines   . Obesity   . Osteoarthritis   . Sleep apnea      Social History   Tobacco Use  Smoking Status Former Smoker  . Types: Cigarettes  . Quit date: 08/30/1971  . Years since quitting: 47.8  Smokeless Tobacco Never Used    Social History   Substance and Sexual Activity  Alcohol Use No  . Alcohol/week: 0.0 standard drinks     Allergies  Allergen Reactions  . Aspirin Anaphylaxis  . Bee Pollen     Extreme swelling due to bee stings  . Codeine Nausea And Vomiting  . Fish Oil Diarrhea  . Sulfa Antibiotics Nausea And Vomiting  . Iron Other (See Comments)    Severe Headache  . Camphor Dermatitis  . Camphor Dermatitis  . Lisinopril Cough  . Penicillins Rash    DID THE REACTION INVOLVE: Swelling of the face/tongue/throat, SOB, or low BP? Yes Sudden or severe rash/hives, skin peeling, or the inside of the mouth or nose? Unknown Did it require medical treatment? Unknown When did  it last happen? If all above answers are "NO", may proceed with cephalosporin use.   . Sulfasalazine Nausea And Vomiting  . Vicodin [Hydrocodone-Acetaminophen] Nausea And Vomiting    Current Outpatient Medications  Medication Sig Dispense Refill  . Acetaminophen (TYLENOL ARTHRITIS PAIN PO) Take 650 mg by mouth every 8 (eight) hours.     [name] allopurinol (ZYLOPRIM) 100 MG tablet Take 1 tablet (100 mg total) by mouth daily. 30 tablet 0  . atorvastatin (LIPITOR) 80 MG tablet Take 1 tablet (80 mg total) by mouth daily at 6 PM. 30 tablet 2  . clopidogrel (PLAVIX) 75 MG  tablet Take 1 tablet (75 mg total) by mouth daily. 30 tablet 2  . diclofenac sodium (VOLTAREN) 1 % GEL Apply 2 gm topically to left knee 100 g 0  . Ensure (ENSURE) Take 237 mLs by mouth as needed.    . furosemide (LASIX) 40 MG tablet Take 1 tablet (40 mg total) by mouth daily. 30 tablet 1  . levothyroxine (SYNTHROID) 50 MCG tablet Take 1 tablet (50 mcg total) by mouth daily before breakfast. 30 tablet 0  . Lidocaine (ASPERCREME LIDOCAINE) 4 % PTCH Place 1 patch onto the skin daily. APPLY TOPICALLY TO LOWER BACK ONCE DAILY FOR PAIN 30 patch 0  . LORazepam (ATIVAN) 0.5 MG tablet Take 0.5 mg by mouth 2 (two) times daily.    [name] losartan (COZAAR) 25 MG tablet Take 0.5 tablets (12.5 mg total) by mouth daily. 45 tablet 3  . Melatonin 3 MG TABS Take 1 tablet (3 mg total) by mouth at bedtime. FOR INSOMNIA 30 tablet 0  . metFORMIN (GLUCOPHAGE) 500 MG tablet Take 1 tablet (500 mg total) by mouth 2 (two) times daily with a meal. 60 tablet 0  . metoprolol succinate (TOPROL XL) 25 MG 24 hr tablet Take 1 tablet (25 mg total) by mouth daily. 90 tablet 3  . Multiple Vitamins-Iron (MULTIVITAMINS WITH IRON) TABS tablet Take 1 tablet by mouth daily. 100 tablet 1  . ondansetron (ZOFRAN) 4 MG tablet Take 1 tablet (4 mg total) by mouth every 6 (six) hours as needed for nausea or vomiting. GIVE 1 TABLET BY MOUTH EVERY 6 HOURS AS NEEDED FOR NAUSEA/VOMITING 20 tablet 0  . ONETOUCH VERIO test strip USE TO TEST BLOOD GLUCOSE ONCE DAILY 100 each 0  . pantoprazole (PROTONIX) 40 MG tablet Take 1 tablet (40 mg total) by mouth daily. 30 tablet 0  . polyvinyl alcohol (LIQUIFILM TEARS) 1.4 % ophthalmic solution Place 1 drop into both eyes every 4 (four) hours as needed for dry eyes. 15 mL 0  . potassium chloride SA (K-DUR) 20 MEQ tablet Take 1 tablet (20 mEq total) by mouth daily. While taking Lasix (furosemide). 30 tablet 2  . spironolactone (ALDACTONE) 25 MG tablet Take 12.5 mg by mouth daily.    . vitamin B-12 (CYANOCOBALAMIN)  1000 MCG tablet 1 tab by orally daily 30 tablet 0   No current facility-administered medications for this visit.        Physical Exam: BP 113/62 (BP Location: Right Arm, Patient Position: Sitting, Cuff Size: Normal)   Pulse 95   Temp (!) 97.3 F (36.3 C)   Resp 16   Ht 5\' 3"  (1.6 m)   Wt 180 lb (81.6 kg)   SpO2 95% Comment: ON RA  BMI 31.89 kg/m   General appearance: alert, cooperative, appears stated age and no distress Neurologic: intact Heart: regular rate and rhythm, S1, S2 normal, no murmur, click, rub or gallop  Lungs: clear to auscultation bilaterally Abdomen: soft, non-tender; bowel sounds normal; no masses,  no organomegaly Extremities: Follow results of chest x-rayPatient has moderate bilateral lower extremity edema, there is no evidence of cellulitis Wound: Sternal wound is stable and well-healed   Diagnostic Studies & Laboratory data:     Recent Radiology Findings:   Dg Chest 2 View  Result Date: 06/13/2019 CLINICAL DATA:  Post CABG EXAM: CHEST - 2 VIEW COMPARISON:  05/28/2019 FINDINGS: Cardiomegaly. Prior CABG. Small bilateral pleural effusions. Bibasilar atelectasis or scarring. No edema. No acute bony abnormality. IMPRESSION: Cardiomegaly. Bibasilar atelectasis or scarring and small effusions. No overt edema. Electronically Signed   By: Rolm Baptise M.D.   On: 06/13/2019 10:55    I have independently reviewed the above radiology studies  and reviewed the findings with the patient.    Recent Lab Findings: Lab Results  Component Value Date   WBC 8.7 05/28/2019   HGB 7.7 (L) 05/28/2019   HCT 25.1 (L) 05/28/2019   PLT 481 (H) 05/28/2019   GLUCOSE 104 (H) 05/28/2019   ALT 14 05/13/2019   AST 22 05/13/2019   NA 138 05/28/2019   K 4.6 05/28/2019   CL 100 05/28/2019   CREATININE 0.93 05/28/2019   BUN 14 05/28/2019   CO2 28 05/28/2019   TSH 3.265 09/09/2018   INR 1.5 (H) 05/15/2019   HGBA1C 6.3 (H) 05/13/2019      Assessment / Plan:   Patient  progressing satisfactorily after coronary artery bypass grafting considering her preoperative condition.  Recommend that she continue with physical therapy working on transfers. She can use her arms more but no lifting over 20 pounds Continue follow-up with cardiology, will be seen in the surgery office as needed   Medication Changes: No orders of the defined types were placed in this encounter.     Grace Isaac MD      [address] [place] [address] Office [phone]   Beeper [phone]  06/13/2019 11:29 AM

## 2019-06-24 ENCOUNTER — Other Ambulatory Visit: Payer: Self-pay

## 2019-06-24 ENCOUNTER — Telehealth: Payer: Self-pay | Admitting: Hematology

## 2019-06-24 ENCOUNTER — Inpatient Hospital Stay (HOSPITAL_BASED_OUTPATIENT_CLINIC_OR_DEPARTMENT_OTHER): Payer: Medicare Other | Admitting: Hematology

## 2019-06-24 ENCOUNTER — Inpatient Hospital Stay: Payer: Medicare Other | Attending: Hematology

## 2019-06-24 VITALS — BP 119/64 | HR 86 | Temp 98.9°F | Resp 17 | Ht 63.0 in

## 2019-06-24 DIAGNOSIS — J45909 Unspecified asthma, uncomplicated: Secondary | ICD-10-CM | POA: Insufficient documentation

## 2019-06-24 DIAGNOSIS — D649 Anemia, unspecified: Secondary | ICD-10-CM

## 2019-06-24 DIAGNOSIS — E669 Obesity, unspecified: Secondary | ICD-10-CM | POA: Diagnosis not present

## 2019-06-24 DIAGNOSIS — E538 Deficiency of other specified B group vitamins: Secondary | ICD-10-CM

## 2019-06-24 DIAGNOSIS — Z79899 Other long term (current) drug therapy: Secondary | ICD-10-CM | POA: Insufficient documentation

## 2019-06-24 DIAGNOSIS — Z87891 Personal history of nicotine dependence: Secondary | ICD-10-CM | POA: Insufficient documentation

## 2019-06-24 DIAGNOSIS — F329 Major depressive disorder, single episode, unspecified: Secondary | ICD-10-CM | POA: Insufficient documentation

## 2019-06-24 DIAGNOSIS — E785 Hyperlipidemia, unspecified: Secondary | ICD-10-CM | POA: Insufficient documentation

## 2019-06-24 DIAGNOSIS — D509 Iron deficiency anemia, unspecified: Secondary | ICD-10-CM | POA: Diagnosis not present

## 2019-06-24 DIAGNOSIS — G4730 Sleep apnea, unspecified: Secondary | ICD-10-CM | POA: Diagnosis not present

## 2019-06-24 DIAGNOSIS — M129 Arthropathy, unspecified: Secondary | ICD-10-CM | POA: Diagnosis not present

## 2019-06-24 DIAGNOSIS — E119 Type 2 diabetes mellitus without complications: Secondary | ICD-10-CM | POA: Diagnosis not present

## 2019-06-24 DIAGNOSIS — E039 Hypothyroidism, unspecified: Secondary | ICD-10-CM | POA: Insufficient documentation

## 2019-06-24 DIAGNOSIS — M1990 Unspecified osteoarthritis, unspecified site: Secondary | ICD-10-CM | POA: Diagnosis not present

## 2019-06-24 DIAGNOSIS — I10 Essential (primary) hypertension: Secondary | ICD-10-CM | POA: Insufficient documentation

## 2019-06-24 LAB — CBC WITH DIFFERENTIAL/PLATELET
Abs Immature Granulocytes: 0.02 10*3/uL (ref 0.00–0.07)
Basophils Absolute: 0 10*3/uL (ref 0.0–0.1)
Basophils Relative: 1 %
Eosinophils Absolute: 0.2 10*3/uL (ref 0.0–0.5)
Eosinophils Relative: 3 %
HCT: 32.4 % — ABNORMAL LOW (ref 36.0–46.0)
Hemoglobin: 10 g/dL — ABNORMAL LOW (ref 12.0–15.0)
Immature Granulocytes: 0 %
Lymphocytes Relative: 17 %
Lymphs Abs: 1.5 10*3/uL (ref 0.7–4.0)
MCH: 27 pg (ref 26.0–34.0)
MCHC: 30.9 g/dL (ref 30.0–36.0)
MCV: 87.3 fL (ref 80.0–100.0)
Monocytes Absolute: 0.5 10*3/uL (ref 0.1–1.0)
Monocytes Relative: 6 %
Neutro Abs: 6.2 10*3/uL (ref 1.7–7.7)
Neutrophils Relative %: 73 %
Platelets: 338 10*3/uL (ref 150–400)
RBC: 3.71 MIL/uL — ABNORMAL LOW (ref 3.87–5.11)
RDW: 15.9 % — ABNORMAL HIGH (ref 11.5–15.5)
WBC: 8.4 10*3/uL (ref 4.0–10.5)
nRBC: 0 % (ref 0.0–0.2)

## 2019-06-24 LAB — CMP (CANCER CENTER ONLY)
ALT: 15 U/L (ref 0–44)
AST: 25 U/L (ref 15–41)
Albumin: 3.6 g/dL (ref 3.5–5.0)
Alkaline Phosphatase: 105 U/L (ref 38–126)
Anion gap: 11 (ref 5–15)
BUN: 23 mg/dL (ref 8–23)
CO2: 23 mmol/L (ref 22–32)
Calcium: 8.7 mg/dL — ABNORMAL LOW (ref 8.9–10.3)
Chloride: 108 mmol/L (ref 98–111)
Creatinine: 0.82 mg/dL (ref 0.44–1.00)
GFR, Est AFR Am: >60 mL/min (ref >60)
GFR, Estimated: >60 mL/min (ref >60)
Glucose, Bld: 115 mg/dL — ABNORMAL HIGH (ref 70–99)
Potassium: 4.1 mmol/L (ref 3.5–5.1)
Sodium: 142 mmol/L (ref 135–145)
Total Bilirubin: 0.4 mg/dL (ref 0.3–1.2)
Total Protein: 6.8 g/dL (ref 6.5–8.1)

## 2019-06-24 LAB — VITAMIN B12: Vitamin B-12: 664 pg/mL (ref 180–914)

## 2019-06-24 LAB — IRON AND TIBC
Iron: 45 ug/dL (ref 41–142)
Saturation Ratios: 15 % — ABNORMAL LOW (ref 21–57)
TIBC: 306 ug/dL (ref 236–444)
UIBC: 261 ug/dL (ref 120–384)

## 2019-06-24 LAB — FERRITIN: Ferritin: 81 ng/mL (ref 11–307)

## 2019-06-24 NOTE — Telephone Encounter (Signed)
Scheduled appt per 10/26 los.  Spoke with pt and she is aware of her appt date and time.

## 2019-06-24 NOTE — Progress Notes (Signed)
[name]    HEMATOLOGY/ONCOLOGY CLINIC NOTE  Date of Service: 06/24/2019  Patient Care Team: [name], MD as PCP - General (Family Medicine) Nahser, Wonda Cheng, MD as PCP - Cardiology (Cardiology)  CHIEF COMPLAINTS/PURPOSE OF CONSULTATION:  Normocytic Anemia  HISTORY OF PRESENTING ILLNESS:  Megan [name] is a wonderful 73 y.o. female who has been referred to [name] by Dr .[name], MD  for evaluation and management of normocytic anemia.  Patient has a h/o inflammatory arthritis, hypothyroidism, iron deficiency, HTN, DM2, glaucoma, asthma . Patient had recent labs with her PCP on 08/13/2018 which showed WBC 7.9k, HCT 30.4, MCV 86, platelets of 349k. Nl FT4 levels. Ferritin 32.  Interval History:   Megan [name] returns today for management and evaluation of her anemia. The patient's last visit with [name] was on 02/20/2019. The pt reports that she is doing well overall.  The pt reports that she had a heart attack and a double Coronary Artery Bypass Graft in the interim. Pt does not remember getting any IV iron or blood transfusions while in the hospital. She is still in rehab at [facility] and it is going well. Pt still has some minimal leg swelling but it is much improved. Pt is having some anxiety issues trying to breathe through the mask. She is experiencing chest pains at the site of incision. Pt has not open wounds except for on her lower leg where they harvested a vein. She has been having some right shoulder pain and Dr. Sharlett Iles believes that it could be a rotator cuff injury. Pt is also still experiencing weakness in her leg, she wanted to see a specialist but could not due to being in the hospital.   Lab results today (06/24/19) of CBC w/diff and CMP is as follows: all values are WNL except for RBC at 3.71, Hgb at 10.0, HCT at 32.4, RDW at 15.9, Glucose at 115, Calcium at 8.7. 06/24/2019 Ferritin at 81 06/24/2019 Vitamin B12 at 644 06/24/2019 Iron and TIBC is as follows:  Iron at 45, TIC at 306, Sat Ratios at 15, UIBC at 261  On review of systems, pt reports improved leg swelling, SOB, chest pain, right shoulder pain, leg weakness, anxiety and denies any other symptoms.    MEDICAL HISTORY:  Past Medical History:  Diagnosis Date  . Allergy    bee stings  . Anxiety   . Asthmatic bronchitis   . Broken arm 1957/2008   right  . Cataract    had surgery   . Depression   . Diabetes mellitus without complication ([place])    prediabetes diet controlled  . Diverticulosis   . GERD (gastroesophageal reflux disease)   . Glaucoma    BIL  . Hemorrhoids   . Hx of adenomatous colonic polyps 12/02/2010  . Hyperlipidemia   . Hypertension   . Hypothyroidism 05/2018  . Inflammatory arthritis   . Knee bursitis   . Migraines   . Obesity   . Osteoarthritis   . Sleep apnea     SURGICAL HISTORY: Past Surgical History:  Procedure Laterality Date  . CARPAL TUNNEL RELEASE Left    left wrist  . CATARACT EXTRACTION W/ INTRAOCULAR LENS  IMPLANT, BILATERAL Bilateral    1999 left, 2008 right  . COLONOSCOPY    . CORONARY ARTERY BYPASS GRAFT N/A 05/15/2019   Procedure: CORONARY ARTERY BYPASS GRAFTING (CABG) times two, left internal mammary artery to left anterior descending artery and left greater saphenous vein to posterior descending artery, left sapheouns  vein harvested endoscopically ;  Surgeon: Grace Isaac, MD;  Location: [facility];  Service: Open Heart Surgery;  Laterality: N/A;  . EXCISION MORTON'S NEUROMA Left 1978   left foot  . GLAUCOMA SURGERY Bilateral    and laser surgery, 2004 left, 2008 right  . LEFT HEART CATH AND CORONARY ANGIOGRAPHY N/A 05/13/2019   Procedure: LEFT HEART CATH AND CORONARY ANGIOGRAPHY;  Surgeon: Martinique, Peter M, MD;  Location: [facility];  Service: Cardiovascular;  Laterality: N/A;  . PARTIAL HYSTERECTOMY  1991   Left an ovary  . POLYPECTOMY    . TEE WITHOUT CARDIOVERSION N/A 05/15/2019   Procedure: TRANSESOPHAGEAL  ECHOCARDIOGRAM (TEE);  Surgeon: Grace Isaac, MD;  Location: [facility];  Service: Open Heart Surgery;  Laterality: N/A;  . TOENAIL AVULSION Right    big toe    SOCIAL HISTORY: Social History   Socioeconomic History  . Marital status: Married    Spouse name: Lynnae Sandhoff  . Number of children: 0  . Years of education: Not on file  . Highest education level: Associate degree: occupational, Hotel manager, or vocational program  Occupational History  . Occupation: retired  Scientific laboratory technician  . Financial resource strain: Not on file  . Food insecurity    Worry: Not on file    Inability: Not on file  . Transportation needs    Medical: Not on file    Non-medical: Not on file  Tobacco Use  . Smoking status: Former Smoker    Types: Cigarettes    Quit date: 08/30/1971    Years since quitting: 47.8  . Smokeless tobacco: Never Used  Substance and Sexual Activity  . Alcohol use: No    Alcohol/week: 0.0 standard drinks  . Drug use: No  . Sexual activity: Not on file  Lifestyle  . Physical activity    Days per week: Not on file    Minutes per session: Not on file  . Stress: Not on file  Relationships  . Social Herbalist on phone: Not on file    Gets together: Not on file    Attends religious service: Not on file    Active member of club or organization: Not on file    Attends meetings of clubs or organizations: Not on file    Relationship status: Not on file  . Intimate partner violence    Fear of current or ex partner: Not on file    Emotionally abused: Not on file    Physically abused: Not on file    Forced sexual activity: Not on file  Other Topics Concern  . Not on file  Social History Narrative   She is married, no children, retired.  Lives with husband in a one story home.  Retired from Geologist, engineering for [employer].    FAMILY HISTORY: Family History  Problem Relation Age of Onset  . Colon polyps Maternal Grandfather   . Bone cancer Maternal Grandfather   . Hypertension  Mother   . Diabetes Mother   . Parkinson's disease Mother   . Hypertension Father   . Diabetes Father   . Heart Problems Father   . Asthma Brother   . Diabetes Brother   . Breast cancer Brother        stage 4, both breast  . Obesity Other   . Diabetes Sister        x 2  . COPD Sister   . Asthma Sister   . Clotting disorder Sister  blood count  . Rectal cancer Neg Hx   . Stomach cancer Neg Hx   . Esophageal cancer Neg Hx     ALLERGIES:  is allergic to aspirin; bee pollen; codeine; fish oil; sulfa antibiotics; iron; camphor; camphor; lisinopril; penicillins; sulfasalazine; and vicodin [hydrocodone-acetaminophen].  MEDICATIONS:  Current Outpatient Medications  Medication Sig Dispense Refill  . Acetaminophen (TYLENOL ARTHRITIS PAIN PO) Take 650 mg by mouth every 8 (eight) hours.     [name] allopurinol (ZYLOPRIM) 100 MG tablet Take 1 tablet (100 mg total) by mouth daily. 30 tablet 0  . atorvastatin (LIPITOR) 80 MG tablet Take 1 tablet (80 mg total) by mouth daily at 6 PM. 30 tablet 2  . clopidogrel (PLAVIX) 75 MG tablet Take 1 tablet (75 mg total) by mouth daily. 30 tablet 2  . diclofenac sodium (VOLTAREN) 1 % GEL Apply 2 gm topically to left knee 100 g 0  . Ensure (ENSURE) Take 237 mLs by mouth as needed.    . furosemide (LASIX) 40 MG tablet Take 1 tablet (40 mg total) by mouth daily. 30 tablet 1  . levothyroxine (SYNTHROID) 50 MCG tablet Take 1 tablet (50 mcg total) by mouth daily before breakfast. 30 tablet 0  . Lidocaine (ASPERCREME LIDOCAINE) 4 % PTCH Place 1 patch onto the skin daily. APPLY TOPICALLY TO LOWER BACK ONCE DAILY FOR PAIN 30 patch 0  . LORazepam (ATIVAN) 0.5 MG tablet Take 0.5 mg by mouth 2 (two) times daily.    [name] losartan (COZAAR) 25 MG tablet Take 0.5 tablets (12.5 mg total) by mouth daily. 45 tablet 3  . Melatonin 3 MG TABS Take 1 tablet (3 mg total) by mouth at bedtime. FOR INSOMNIA 30 tablet 0  . metFORMIN (GLUCOPHAGE) 500 MG tablet Take 1 tablet (500 mg  total) by mouth 2 (two) times daily with a meal. 60 tablet 0  . metoprolol succinate (TOPROL XL) 25 MG 24 hr tablet Take 1 tablet (25 mg total) by mouth daily. 90 tablet 3  . Multiple Vitamins-Iron (MULTIVITAMINS WITH IRON) TABS tablet Take 1 tablet by mouth daily. 100 tablet 1  . ondansetron (ZOFRAN) 4 MG tablet Take 1 tablet (4 mg total) by mouth every 6 (six) hours as needed for nausea or vomiting. GIVE 1 TABLET BY MOUTH EVERY 6 HOURS AS NEEDED FOR NAUSEA/VOMITING 20 tablet 0  . ONETOUCH VERIO test strip USE TO TEST BLOOD GLUCOSE ONCE DAILY 100 each 0  . pantoprazole (PROTONIX) 40 MG tablet Take 1 tablet (40 mg total) by mouth daily. 30 tablet 0  . polyvinyl alcohol (LIQUIFILM TEARS) 1.4 % ophthalmic solution Place 1 drop into both eyes every 4 (four) hours as needed for dry eyes. 15 mL 0  . potassium chloride SA (K-DUR) 20 MEQ tablet Take 1 tablet (20 mEq total) by mouth daily. While taking Lasix (furosemide). 30 tablet 2  . spironolactone (ALDACTONE) 25 MG tablet Take 12.5 mg by mouth daily.    . vitamin B-12 (CYANOCOBALAMIN) 1000 MCG tablet 1 tab by orally daily 30 tablet 0   No current facility-administered medications for this visit.     REVIEW OF SYSTEMS:   A 10+ POINT REVIEW OF SYSTEMS WAS OBTAINED including neurology, dermatology, psychiatry, cardiac, respiratory, lymph, extremities, GI, GU, Musculoskeletal, constitutional, breasts, reproductive, HEENT.  All pertinent positives are noted in the HPI.  All others are negative.   PHYSICAL EXAMINATION: ECOG PERFORMANCE STATUS: 2 - Symptomatic, <50% confined to bed  Vitals:   06/24/19 1237  BP: 119/64  Pulse:  86  Resp: 17  Temp: 98.9 F (37.2 C)  SpO2: 100%   Filed Weights   .Body mass index is 31.89 kg/m.  GENERAL:alert, in no acute distress and comfortable SKIN: no acute rashes, no significant lesions EYES: conjunctiva are pink and non-injected, sclera anicteric OROPHARYNX: MMM, no exudates, no oropharyngeal erythema or  ulceration NECK: supple, no JVD LYMPH:  no palpable lymphadenopathy in the cervical, axillary or inguinal regions LUNGS: clear to auscultation b/l with normal respiratory effort HEART: regular rate & rhythm ABDOMEN:  normoactive bowel sounds , non tender, not distended. No palpable hepatosplenomegaly.  Extremity: no pedal edema  PSYCH: alert & oriented x 3 with fluent speech NEURO: no focal motor/sensory deficits  LABORATORY DATA:  I have reviewed the data as listed  . CBC Latest Ref Rng & Units 06/24/2019 05/28/2019 05/25/2019  WBC 4.0 - 10.5 K/uL 8.4 8.7 8.9  Hemoglobin 12.0 - 15.0 g/dL 10.0(L) 7.7(L) 7.5(L)  Hematocrit 36.0 - 46.0 % 32.4(L) 25.1(L) 24.4(L)  Platelets 150 - 400 K/uL 338 481(H) 402(H)   . CMP Latest Ref Rng & Units 06/24/2019 05/28/2019 05/25/2019  Glucose 70 - 99 mg/dL 115(H) 104(H) 134(H)  BUN 8 - 23 mg/dL 23 14 19   Creatinine 0.44 - 1.00 mg/dL 0.82 0.93 1.00  Sodium 135 - 145 mmol/L 142 138 141  Potassium 3.5 - 5.1 mmol/L 4.1 4.6 4.5  Chloride 98 - 111 mmol/L 108 100 105  CO2 22 - 32 mmol/L 23 28 27   Calcium 8.9 - 10.3 mg/dL 8.7(L) 7.4(L) 7.6(L)  Total Protein 6.5 - 8.1 g/dL 6.8 - -  Total Bilirubin 0.3 - 1.2 mg/dL 0.4 - -  Alkaline Phos 38 - 126 U/L 105 - -  AST 15 - 41 U/L 25 - -  ALT 0 - 44 U/L 15 - -   . Lab Results  Component Value Date   IRON 45 06/24/2019   TIBC 306 06/24/2019   IRONPCTSAT 15 (L) 06/24/2019   (Iron and TIBC)  Lab Results  Component Value Date   FERRITIN 81 06/24/2019    RADIOGRAPHIC STUDIES: I have personally reviewed the radiological images as listed and agreed with the findings in the report. Dg Chest 2 View  Result Date: 06/13/2019 CLINICAL DATA:  Post CABG EXAM: CHEST - 2 VIEW COMPARISON:  05/28/2019 FINDINGS: Cardiomegaly. Prior CABG. Small bilateral pleural effusions. Bibasilar atelectasis or scarring. No edema. No acute bony abnormality. IMPRESSION: Cardiomegaly. Bibasilar atelectasis or scarring and small  effusions. No overt edema. Electronically Signed   By: Rolm Baptise M.D.   On: 06/13/2019 10:55   Dg Chest Port 1 View  Result Date: 05/28/2019 CLINICAL DATA:  Post CABG EXAM: PORTABLE CHEST 1 VIEW COMPARISON:  05/21/2019 FINDINGS: Prior CABG. Cardiomegaly. No pneumothorax. Small bilateral pleural effusions with bibasilar atelectasis, left greater than right. No real change since prior study. IMPRESSION: Small bilateral effusions with bibasilar atelectasis, left greater than right. No pneumothorax. Electronically Signed   By: Rolm Baptise M.D.   On: 05/28/2019 08:28    ASSESSMENT & PLAN:   73 y.o. female with   1) Normocytic Anemia Likely multifactorial.   Primarily anemia of chronic disease from b/l leg swelling and chronic venous stasis dermatitis. - patient recommended to f/u with PCP to optimize mx of her pedal edema and venous stasis dermatitis and wound care considerations. Some iron deficiency anemia Normal LDH - no evidence of hemolysis 09/04/18 Sed rate at 66 myeloma panel - no evidence of M spike  PLAN: -Discussed pt  labwork today, 06/24/19; all values are WNL except for RBC at 3.71, Hgb at 10.0, HCT at 32.4, RDW at 15.9, Glucose at 115, Calcium at 8.7. -Discussed 06/24/2019 Ferritin at 81, Goal for ferritin >100 -Discussed 06/24/2019 Vitamin B12 at 644 -Discussed 06/24/2019 Iron and TIBC is as follows: Iron at 45, TIC at 306, Sat Ratios at 15, UIBC at 261  -As pt has not been able to tolerate PO iron replacement, will replace iron IV as necessary -Continue SL 1030mcg Vitamin B12 daily to optimize B12 levels >400. -Will see the pt back in 6 months   FOLLOW UP: RTC with Dr Irene Limbo with labs in 6 months  Orders Placed This Encounter  Procedures  . CBC with Differential/Platelet    Standing Status:   Future    Standing Expiration Date:   07/28/2020  . CMP ([facility] only)    Standing Status:   Future    Standing Expiration Date:   06/23/2020  . Ferritin    Standing  Status:   Future    Standing Expiration Date:   06/23/2020  . Iron and TIBC    Standing Status:   Future    Standing Expiration Date:   06/23/2020  . Vitamin B12    Standing Status:   Future    Standing Expiration Date:   06/23/2020    The total time spent in the appt was 15 minutes and more than 50% was on counseling and direct patient cares.  All of the patient's questions were answered with apparent satisfaction. The patient knows to call the clinic with any problems, questions or concerns.   Sullivan Lone MD [place] AAHIVMS [facility] Hematology/Oncology Physician [facility]  (Office):       [phone] (Work cell):  [phone] (Fax):           [phone]  06/24/2019 5:34 PM  I, Yevette Edwards, am acting as a scribe for Dr. Sullivan Lone.   .I have reviewed the above documentation for accuracy and completeness, and I agree with the above. Brunetta Genera MD

## 2019-06-30 DIAGNOSIS — D509 Iron deficiency anemia, unspecified: Secondary | ICD-10-CM | POA: Insufficient documentation

## 2019-07-01 ENCOUNTER — Telehealth: Payer: Self-pay | Admitting: *Deleted

## 2019-07-01 NOTE — Telephone Encounter (Signed)
Contacted patient regarding test results per Dr. Grier Mitts directions: Ferritin is <100 - would offer patient option for IV iron x 1 dose if agreeable.  Patient moving from rehab to [facility] today 11/2 and is not sure how the new facility will manage transportation. Patient requests call back later in week once settled to arrange infusion.

## 2019-07-03 ENCOUNTER — Ambulatory Visit (INDEPENDENT_AMBULATORY_CARE_PROVIDER_SITE_OTHER): Payer: Medicare Other | Admitting: Cardiovascular Disease

## 2019-07-03 ENCOUNTER — Other Ambulatory Visit: Payer: Self-pay

## 2019-07-03 ENCOUNTER — Encounter

## 2019-07-03 ENCOUNTER — Encounter: Payer: Self-pay | Admitting: Cardiovascular Disease

## 2019-07-03 VITALS — BP 112/62 | HR 88 | Ht 62.5 in | Wt 180.0 lb

## 2019-07-03 DIAGNOSIS — I2510 Atherosclerotic heart disease of native coronary artery without angina pectoris: Secondary | ICD-10-CM

## 2019-07-03 DIAGNOSIS — I110 Hypertensive heart disease with heart failure: Secondary | ICD-10-CM | POA: Diagnosis not present

## 2019-07-03 DIAGNOSIS — I5032 Chronic diastolic (congestive) heart failure: Secondary | ICD-10-CM

## 2019-07-03 DIAGNOSIS — I480 Paroxysmal atrial fibrillation: Secondary | ICD-10-CM

## 2019-07-03 DIAGNOSIS — Z951 Presence of aortocoronary bypass graft: Secondary | ICD-10-CM

## 2019-07-03 MED ORDER — FUROSEMIDE 40 MG PO TABS: 40.0000 mg | ORAL_TABLET | Freq: Two times a day (BID) | ORAL | 3 refills | Status: DC

## 2019-07-03 MED ORDER — POTASSIUM CHLORIDE ER 10 MEQ PO TBCR: 10.0000 meq | EXTENDED_RELEASE_TABLET | Freq: Every day | ORAL | 3 refills | Status: DC

## 2019-07-03 NOTE — Patient Instructions (Signed)
Medication Instructions:  Your physician has recommended you make the following change in your medication:  INCREASE Furosemide (Lasix) to 40 mg twice daily START Kdur (Potassium chloride) 10 mEq once daily  *If you need a refill on your cardiac medications before your next appointment, please call your pharmacy*  Lab Work: Your physician recommends that you return for lab work on November 17. You may come anytime between 7:30 am and 4:45 pm and you need to FAST for the appointment (no solid food after midnight, only water)   Testing/Procedures: None Ordered    Follow-Up: At [facility], you and your health needs are our priority.  As part of our continuing mission to provide you with exceptional heart care, we have created designated Provider Care Teams.  These Care Teams include your primary Cardiologist (physician) and Advanced Practice Providers (APPs -  Physician Assistants and Nurse Practitioners) who all work together to provide you with the care you need, when you need it.  Your next appointment:   2 months on January 8 at 10:00 am  The format for your next appointment:   In Person  Provider:   Daune Perch, NP
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
## 2019-07-04 ENCOUNTER — Telehealth: Payer: Self-pay | Admitting: *Deleted

## 2019-07-04 ENCOUNTER — Telehealth: Payer: Self-pay | Admitting: Hematology

## 2019-07-04 NOTE — Telephone Encounter (Signed)
Scheduled appt per 1/15 sch message - pt is aware of appt date and time   

## 2019-07-04 NOTE — Telephone Encounter (Signed)
11/3: Attempted to contact patient as agreed in note below. LVVM with same information and requested patient contact office   ----- Message ----- From: Rolland Bimler, RN Sent: 07/01/2019   Subject: Patient requests call 07/04/2019               Contacted patient with results and information. Patient moving from rehab to Assisted Living this date and is not sure how the new facility will manage transportation. Patient requests call back later in week once settled.  ----- Message ----- From: Brunetta Genera, MD Sent: 06/30/2019   Subject: Results ferritin is <100 --- would offer patient option forIVIron x 1 dose. If agreeable.

## 2019-07-04 NOTE — Telephone Encounter (Signed)
Patient called back and is agreeable to have IV iron. Schedule message sent to make appt.

## 2019-07-04 NOTE — Telephone Encounter (Signed)
-----   Message from Rolland Bimler, RN sent at 07/04/2019  1:47 PM EST ----- Regarding: FW: Patient requests call 07/04/2019 11/3: LVVM with same information and requested patient contact office  ----- Message ----- From: Rolland Bimler, RN Sent: 07/01/2019   8:42 AM EST To: Rolland Bimler, RN Subject: Patient requests call 07/04/2019                11/2 - contacted patient with results and information. Patient moving from rehab to Assisted Living this date and is not sure how the new facility will manage transportation. Patient requests call back later in week once settled. ----- Message ----- From: Brunetta Genera, MD Sent: 06/30/2019  11:40 PM EST To: Rolland Bimler, RN  ferirtin is <100 --- would offer patient option forIVIron x 1 dose. If agreeable.

## 2019-07-12 ENCOUNTER — Inpatient Hospital Stay: Payer: Medicare Other | Attending: Hematology

## 2019-07-12 ENCOUNTER — Other Ambulatory Visit: Payer: Self-pay

## 2019-07-12 VITALS — BP 95/53 | HR 85 | Temp 98.5°F | Resp 16

## 2019-07-12 DIAGNOSIS — D509 Iron deficiency anemia, unspecified: Secondary | ICD-10-CM

## 2019-07-12 DIAGNOSIS — D649 Anemia, unspecified: Secondary | ICD-10-CM | POA: Insufficient documentation

## 2019-07-12 DIAGNOSIS — Z79899 Other long term (current) drug therapy: Secondary | ICD-10-CM | POA: Insufficient documentation

## 2019-07-12 MED ORDER — LORATADINE 10 MG PO TABS
ORAL_TABLET | ORAL | Status: AC
  Filled 2019-07-12: qty 1

## 2019-07-12 MED ORDER — LORATADINE 10 MG PO TABS
10.0000 mg | ORAL_TABLET | Freq: Once | ORAL | Status: AC
  Administered 2019-07-12: 11:00:00 10 mg via ORAL

## 2019-07-12 MED ORDER — SODIUM CHLORIDE 0.9 % IV SOLN
750.0000 mg | Freq: Once | INTRAVENOUS | Status: AC
  Administered 2019-07-12: 750 mg via INTRAVENOUS
  Filled 2019-07-12: qty 15

## 2019-07-12 MED ORDER — SODIUM CHLORIDE 0.9 % IV SOLN
Freq: Once | INTRAVENOUS | Status: AC
  Administered 2019-07-12: 11:00:00 via INTRAVENOUS
  Filled 2019-07-12: qty 250

## 2019-07-12 MED ORDER — ACETAMINOPHEN 325 MG PO TABS
650.0000 mg | ORAL_TABLET | Freq: Once | ORAL | Status: AC
  Administered 2019-07-12: 650 mg via ORAL

## 2019-07-12 MED ORDER — ACETAMINOPHEN 325 MG PO TABS
ORAL_TABLET | ORAL | Status: AC
  Filled 2019-07-12: qty 2

## 2019-07-12 NOTE — Patient Instructions (Signed)

## 2019-07-16 ENCOUNTER — Other Ambulatory Visit: Payer: Medicare Other

## 2019-09-05 ENCOUNTER — Ambulatory Visit (INDEPENDENT_AMBULATORY_CARE_PROVIDER_SITE_OTHER): Payer: Medicare Other | Admitting: Orthopaedic Surgery

## 2019-09-05 ENCOUNTER — Ambulatory Visit: Payer: Self-pay

## 2019-09-05 ENCOUNTER — Encounter: Payer: Self-pay | Admitting: Orthopaedic Surgery

## 2019-09-05 ENCOUNTER — Other Ambulatory Visit: Payer: Self-pay

## 2019-09-05 DIAGNOSIS — M545 Low back pain, unspecified: Secondary | ICD-10-CM

## 2019-09-05 DIAGNOSIS — M25511 Pain in right shoulder: Secondary | ICD-10-CM

## 2019-09-05 DIAGNOSIS — G8929 Other chronic pain: Secondary | ICD-10-CM

## 2019-09-05 MED ORDER — LIDOCAINE HCL 2 % IJ SOLN
2.0000 mL | INTRAMUSCULAR | Status: AC | PRN
  Administered 2019-09-05: 10:00:00 2 mL

## 2019-09-05 MED ORDER — BUPIVACAINE HCL 0.25 % IJ SOLN
2.0000 mL | INTRAMUSCULAR | Status: AC | PRN
  Administered 2019-09-05: 2 mL via INTRA_ARTICULAR

## 2019-09-05 MED ORDER — METHYLPREDNISOLONE ACETATE 40 MG/ML IJ SUSP
40.0000 mg | INTRAMUSCULAR | Status: AC | PRN
  Administered 2019-09-05: 40 mg via INTRA_ARTICULAR

## 2019-09-05 NOTE — Progress Notes (Signed)
Cardiology Office Note:    Date:  09/06/2019   ID:  Megan [name], DOB 1945/09/30, MRN [id number]  PCP:  Megan Neer, MD  Cardiologist:  Megan Moores, MD  Referring MD: Megan Neer, MD   Chief Complaint  Patient presents with   Follow-up   Coronary Artery Disease   Congestive Heart Failure    History of Present Illness:    Megan [name] is a 74 y.o. female with a past medical history significant for CAD s/p CABG [id number], diastolic CHF, polyneuropathy, hypertension, GERD, CKD stage III, hypothyroidism, diabetes type 2 and obstructive sleep apnea, vitamin B deficiency  She was treated for bilateral cellulitis in early 2020, fell in the late spring and broke her ankle and tore the ligaments in her knee, was ultimately discharged to a nursing home being released in August.  On 05/13/2019 she was hospitalized for MI and found to have decreased EF to 25-30% and severe ostial LAD lesion.  She ended up undergoing coronary artery bypass surgery.  She had some postop atrial fibrillation and was started on amiodarone which was later discontinued.  She was also started on Plavix postoperatively due to history of anaphylaxis associated with aspirin.  She has been residing at [facility].  The patient was last seen in the office on 07/03/2019 and was noted to have bilateral leg edema.  Lasix was increased to 40 mg twice a day with addition of potassium.  She is here today for follow-up.  She is still living at [facility] assisted living but plans to return home at some point.  She will arrange for sitter service for a few hours each day.  She has completed physical therapy and is now walking with a walker.  She usually makes several laps around collapse 4-5 times per day and does like exercises 2-3 times per day.  She has had 1 or 2 episodes of shortness of breath when walking down the hall too fast but otherwise no significant dyspnea.  She denies weakness, lightheadedness,  palpitations, orthopnea, PND or syncope.  Her weight is down 6 pounds since last visit an increase in Lasix.  She says that her weight is down from 268 pounds in 07/2018 to 174 pounds today.  She feels like it is all fluid.  Her lower extremity edema is much better, almost resolved.  She is wearing bilateral TED hose.  She still has some mild tenderness at her sternal site but this is much improved.  Cardiac studies   Echocardiogram 05/13/2019 IMPRESSIONS   1. The left ventricle has severely reduced systolic function, with an ejection fraction of 25-30%. The cavity size was normal. There is mildly increased left ventricular wall thickness. Left ventricular diastolic Doppler parameters are consistent with  impaired relaxation. Mid to apical anteroseptal and inferoseptal akinesis. Apical anterior, apical lateral, and apical inferior akinesis. Akinesis of the true apex. No LV thrombus noted.  2. Normal RV size with mildly decreased systolic function.  3. There is mild mitral annular calcification present. No evidence of mitral valve stenosis. Trivial mitral regurgitation.  4. The aortic root is normal in size and structure.  5. The aortic valve is tricuspid. No stenosis of the aortic valve.  6. Left atrial size was mildly dilated.  7. The IVC was normal in size with PA systolic pressure 24 mmHg.    Past Medical History:  Diagnosis Date   Allergy    bee stings   Anxiety    Asthmatic bronchitis  Broken arm 1957/2008   right   Cataract    had surgery    Depression    Diabetes mellitus without complication ([place])    prediabetes diet controlled   Diverticulosis    GERD (gastroesophageal reflux disease)    Glaucoma    BIL   Hemorrhoids    Hx of adenomatous colonic polyps 12/02/2010   Hyperlipidemia    Hypertension    Hypothyroidism 05/2018   Inflammatory arthritis    Knee bursitis    Migraines    Obesity    Osteoarthritis    Sleep apnea     Past Surgical  History:  Procedure Laterality Date   CARPAL TUNNEL RELEASE Left    left wrist   CATARACT EXTRACTION W/ INTRAOCULAR LENS  IMPLANT, BILATERAL Bilateral    1999 left, 2008 right   COLONOSCOPY     CORONARY ARTERY BYPASS GRAFT N/A 05/15/2019   Procedure: CORONARY ARTERY BYPASS GRAFTING (CABG) times two, left internal mammary artery to left anterior descending artery and left greater saphenous vein to posterior descending artery, left sapheouns vein harvested endoscopically ;  Surgeon: Megan Isaac, MD;  Location: [facility];  Service: Open Heart Surgery;  Laterality: N/A;   EXCISION MORTON'S NEUROMA Left 1978   left foot   GLAUCOMA SURGERY Bilateral    and laser surgery, 2004 left, 2008 right   LEFT HEART CATH AND CORONARY ANGIOGRAPHY N/A 05/13/2019   Procedure: LEFT HEART CATH AND CORONARY ANGIOGRAPHY;  Surgeon: [name], Megan M, MD;  Location: [facility];  Service: Cardiovascular;  Laterality: N/A;   PARTIAL HYSTERECTOMY  1991   Left an ovary   POLYPECTOMY     TEE WITHOUT CARDIOVERSION N/A 05/15/2019   Procedure: TRANSESOPHAGEAL ECHOCARDIOGRAM (TEE);  Surgeon: Megan Isaac, MD;  Location: [facility];  Service: Open Heart Surgery;  Laterality: N/A;   TOENAIL AVULSION Right    big toe    Current Medications: Current Meds  Medication Sig   Acetaminophen (TYLENOL ARTHRITIS PAIN PO) Take 650 mg by mouth every 8 (eight) hours.    allopurinol (ZYLOPRIM) 100 MG tablet Take 1 tablet (100 mg total) by mouth daily.   atorvastatin (LIPITOR) 80 MG tablet Take 1 tablet (80 mg total) by mouth daily at 6 PM.   clopidogrel (PLAVIX) 75 MG tablet Take 1 tablet (75 mg total) by mouth daily.   diclofenac sodium (VOLTAREN) 1 % GEL Apply 2 gm topically to left knee   furosemide (LASIX) 40 MG tablet Take 1 tablet (40 mg total) by mouth 2 (two) times daily.   levothyroxine (SYNTHROID) 75 MCG tablet Take 75 mcg by mouth daily before breakfast.   Lidocaine (ASPERCREME LIDOCAINE) 4 %  PTCH Place 1 patch onto the skin daily. APPLY TOPICALLY TO LOWER BACK ONCE DAILY FOR PAIN   LORazepam (ATIVAN) 0.5 MG tablet Take 0.5 mg by mouth 2 (two) times daily.   losartan (COZAAR) 25 MG tablet Take 0.5 tablets (12.5 mg total) by mouth daily.   Melatonin 3 MG TABS Take 1 tablet (3 mg total) by mouth at bedtime. FOR INSOMNIA   metFORMIN (GLUCOPHAGE) 500 MG tablet Take 1 tablet (500 mg total) by mouth 2 (two) times daily with a meal.   metoprolol succinate (TOPROL XL) 25 MG 24 hr tablet Take 1 tablet (25 mg total) by mouth daily.   Multiple Vitamins-Iron (MULTIVITAMINS WITH IRON) TABS tablet Take 1 tablet by mouth daily.   ondansetron (ZOFRAN) 4 MG tablet Take 1 tablet (4 mg total) by mouth  every 6 (six) hours as needed for nausea or vomiting. GIVE 1 TABLET BY MOUTH EVERY 6 HOURS AS NEEDED FOR NAUSEA/VOMITING   ONETOUCH VERIO test strip USE TO TEST BLOOD GLUCOSE ONCE DAILY   pantoprazole (PROTONIX) 40 MG tablet Take 1 tablet (40 mg total) by mouth daily.   polyvinyl alcohol (LIQUIFILM TEARS) 1.4 % ophthalmic solution Place 1 drop into both eyes every 4 (four) hours as needed for dry eyes.   Pyridoxine HCl (VITAMIN B6 PO) Take 1 tablet by mouth daily.   spironolactone (ALDACTONE) 25 MG tablet Take 12.5 mg by mouth daily.   vitamin B-12 (CYANOCOBALAMIN) 1000 MCG tablet 1 tab by orally daily   [DISCONTINUED] potassium chloride (KLOR-CON) 10 MEQ tablet Take 1 tablet (10 mEq total) by mouth daily.     Allergies:   Aspirin, Bee pollen, Codeine, Fish oil, Sulfa antibiotics, Iron, Camphor, Camphor, Lisinopril, Penicillins, Sulfasalazine, and Vicodin [hydrocodone-acetaminophen]   Social History   Socioeconomic History   Marital status: Married    Spouse name: Lynnae Sandhoff   Number of children: 0   Years of education: Not on file   Highest education level: Associate degree: occupational, Hotel manager, or vocational program  Occupational History   Occupation: retired  Tobacco Use    Smoking status: Former Smoker    Types: Cigarettes    Quit date: 08/30/1971    Years since quitting: 48.0   Smokeless tobacco: Never Used  Substance and Sexual Activity   Alcohol use: No    Alcohol/week: 0.0 standard drinks   Drug use: No   Sexual activity: Not on file  Other Topics Concern   Not on file  Social History Narrative   She is married, no children, retired.  Lives with husband in a one story home.  Retired from Geologist, engineering for [employer].   Social Determinants of Health   Financial Resource Strain:    Difficulty of Paying Living Expenses: Not on file  Food Insecurity:    Worried About Charity fundraiser in the Last Year: Not on file   [employer] of Food in the Last Year: Not on file  Transportation Needs:    Lack of Transportation (Medical): Not on file   Lack of Transportation (Non-Medical): Not on file  Physical Activity:    Days of Exercise per Week: Not on file   Minutes of Exercise per Session: Not on file  Stress:    Feeling of Stress : Not on file  Social Connections:    Frequency of Communication with Friends and Family: Not on file   Frequency of Social Gatherings with Friends and Family: Not on file   Attends Religious Services: Not on file   Active Member of Clubs or Organizations: Not on file   Attends Archivist Meetings: Not on file   Marital Status: Not on file     Family History: The patient's family history includes Asthma in her brother and sister; Bone cancer in her maternal grandfather; Breast cancer in her brother; COPD in her sister; Clotting disorder in her sister; Colon polyps in her maternal grandfather; Diabetes in her brother, father, mother, and sister; Heart Problems in her father; Hypertension in her father and mother; Obesity in an other family member; Parkinson's disease in her mother. There is no history of Rectal cancer, Stomach cancer, or Esophageal cancer. ROS:   Please see the history of present illness.      All other systems reviewed and are negative.   EKG:  EKG is not  ordered today.    Recent Labs: 09/08/2018: B Natriuretic Peptide 112.0 09/09/2018: TSH 3.265 05/16/2019: Magnesium 2.0 06/24/2019: ALT 15; BUN 23; Creatinine 0.82; Hemoglobin 10.0; Platelets 338; Potassium 4.1; Sodium 142     Recent Lipid Panel No results found for: CHOL, TRIG, HDL, CHOLHDL, VLDL, LDLCALC, LDLDIRECT  Physical Exam:    VS:  BP (!) 102/50    Pulse 90    Ht 5' 2.5" (1.588 m)    Wt 174 lb 1.9 oz (79 kg)    SpO2 96%    BMI 31.34 kg/m     Wt Readings from Last 6 Encounters:  09/06/19 174 lb 1.9 oz (79 kg)  07/03/19 180 lb (81.6 kg)  06/13/19 180 lb (81.6 kg)  06/06/19 180 lb (81.6 kg)  05/29/19 197 lb 8.5 oz (89.6 kg)  04/12/19 180 lb (81.6 kg)     Physical Exam  Constitutional: She is oriented to person, place, and time. She appears well-developed and well-nourished. No distress.  HENT:  Head: Normocephalic and atraumatic.  Neck: No JVD present.  Cardiovascular: Normal rate.  Murmur heard.  Blowing systolic murmur is present with a grade of 2/6 at the upper right sternal border and upper left sternal border. Pulmonary/Chest: Effort normal and breath sounds normal. No respiratory distress. She has no wheezes. She has no rales.  Abdominal: Soft. Bowel sounds are normal.  Musculoskeletal:        General: No edema. Normal range of motion.     Cervical back: Normal range of motion and neck supple.     Comments: Wearing bilateral TED hose  Neurological: She is alert and oriented to person, place, and time.  Skin: Skin is warm and dry.  Psychiatric: She has a normal mood and affect. Her behavior is normal. Judgment and thought content normal.  Vitals reviewed.   ASSESSMENT:    1. S/P CABG x 2   2. Chronic diastolic congestive heart failure (HCC)   3. Paroxysmal atrial fibrillation ([place])   4. Hyperlipidemia LDL goal <70    PLAN:    In order of problems listed above:  CAD -S/p two-vessel  CABG in September. -On Plavix due to history of anaphylaxis with aspirin, statin, beta-blocker. -Patient is getting more active.  No anginal symptoms. -She is planned for the COVID vaccine on Monday. From a heart standpoint we recommend vaccination.   Chronic systolic heart failure -EF down to 25-30% on echo at the time of her bypass in September. -She continues on low-dose beta-blocker, losartan 12.5 mg daily -Lasix was increased to 40 mg twice daily at last office visit 07/03/2019 for lower extremity edema.  Follow-up labs on 07/15/2019 were stable with creatinine 1.06, potassium 4.3 -2 gm sodium diet. Elevate legs when possible.  Continue with knee-high TED hose. -Labs at [facility] done yesterday: SCR 1.15, K+ 5.1.  Magnesium 1.4, likely due to diuretic use. -I will discontinue her potassium supplementation and add magnesium supplement.  Atrial fibrillation, postop -Initially started on amiodarone postoperatively which has now been discontinued. -Maintaining sinus rhythm.  Hyperlipidemia, Goal LDL<70 -On high intensity statin with atorvastatin 80 mg daily -Labs on 07/15/2019 with LDL of 41 which is at goal.  Continue current therapy.  Medication Adjustments/Labs and Tests Ordered: Current medicines are reviewed at length with the patient today.  Concerns regarding medicines are outlined above. Labs and tests ordered and medication changes are outlined in the patient instructions below:  Patient Instructions  Medication Instructions:   Your physician has recommended you make the  following change in your medication:   1) Start Magnesium 400MG , 1 capsule by mouth once a day 2) Stop Potassium 1mEq   *If you need a refill on your cardiac medications before your next appointment, please call your pharmacy*  Lab Work:  None ordered today  Testing/Procedures:  None ordered today  Follow-Up: At [facility], you and your health needs are our priority.  As part of our continuing  mission to provide you with exceptional heart care, we have created designated Provider Care Teams.  These Care Teams include your primary Cardiologist (physician) and Advanced Practice Providers (APPs -  Physician Assistants and Nurse Practitioners) who all work together to provide you with the care you need, when you need it.  Your next appointment:    On 01/07/20 at 9:00AM with Megan Moores, MD  Other Instructions  Lifestyle Modifications to Prevent and Treat Heart Disease -Recommend heart healthy/Mediterranean diet, with whole grains, fruits, vegetables, fish, lean meats, nuts, olive oil and avocado oil.  -Limit salt intake to less than 2000 mg per day.  -Recommend moderate walking, starting slowly with a few minutes and working up. -Recommend avoidance of tobacco products. Avoid excess alcohol. -Keep blood pressure well controlled, ideally less than 130/80.   ========================================  We are recommending the COVID-19 vaccine to all of our patients. Cardiac medications (including blood thinners) should not deter anyone from being vaccinated and there is no need to hold any of those medications prior to vaccine administration.     Currently, there is a hotline to call (active 09/06/19) to schedule vaccination appointments as no walk-ins will be accepted.   Number: [phone]    If you have further questions or concerns about the vaccine process, please visit [email or web address] or contact your primary care physician.            Signed, Daune Perch, NP  09/06/2019 10:52 AM    [place]

## 2019-09-05 NOTE — Progress Notes (Signed)
Office Visit Note   Patient: Megan [name]           Date of Birth: 01-14-1946           MRN: [id number] Visit Date: 09/05/2019              Requested by: [name], MD [address] [place],  [place] [address] PCP: [name], MD   Assessment & Plan: Visit Diagnoses:  1. Right shoulder pain, unspecified chronicity   2. Chronic midline low back pain without sciatica     Plan: Impression is spinal stenosis.  #2 right shoulder subacromial bursitis and rotator cuff tendinitis.  In regards to the back, we will refer her back to Dr. Ernestina Patches for repeat [facility].  In regards to her shoulder, I injected the subacromial space with cortisone today.  She will follow-up with [name] as needed.  Follow-Up Instructions: Return if symptoms worsen or fail to improve.   Orders:  Orders Placed This Encounter  Procedures  . Large Joint Inj: R subacromial bursa  . XR Shoulder Right   No orders of the defined types were placed in this encounter.     Procedures: Large Joint Inj: R subacromial bursa on 09/05/2019 10:27 AM Indications: pain Details: 22 G needle Medications: 2 mL lidocaine 2 %; 2 mL bupivacaine 0.25 %; 40 mg methylPREDNISolone acetate 40 MG/ML Outcome: tolerated well, no immediate complications Patient was prepped and draped in the usual sterile fashion.       Clinical Data: No additional findings.   Subjective: Chief Complaint  Patient presents with  . Right Shoulder - Pain  . Lower Back - Pain    HPI patient is a pleasant 74 year old female who comes in today with recurrent midline lower back pain and new onset right shoulder pain.  In regards to her lumbar spine, she has a history of severe spondylosis at L5-S1 and moderate spinal stenosis and lateral recess stenosis at L4-5 moderate foraminal stenosis.  She was seen by Dr. Ernestina Patches around that time where epidural steroid injections were performed.  She notes that the significantly helped for about 2  years.  Her pain has recently returned and is started to worsen.  The pain is described as a constant ache.  No specific aggravators.  She occasionally uses a pain patch with some relief of symptoms.  No radicular pain.  No numbness, tingling or burning.  No new bowel or bladder change and no saddle paresthesias.  In regards to her right shoulder, she has had pain here for the past 4 months.  No known injury or change in activity.  The pain is to the entire shoulder and radiates into the deltoid and down to the elbow.  She denies any weakness.  She does have increased pain with forward flexion.  She takes Tylenol with mild relief of symptoms.  No numbness, tingling or burning to the right upper extremity.  No previous injection or surgical intervention.  Review of Systems as detailed in HPI.  All others reviewed and are negative.   Objective: Vital Signs: There were no vitals taken for this visit.  Physical Exam well-developed well-nourished female no acute distress.  Alert oriented x3.  Ortho Exam examination of her lumbar spine reveals moderate spinous tenderness to the lower region.  No pain with lumbar flexion or extension.  Negative straight leg raise.  Right shoulder exam shows moderate diffuse tenderness throughout.  50% range of motion all planes.  Positive empty can  and cross body adduction.  Full strength throughout.  She is neurovascular intact distally.  Specialty Comments:  No specialty comments available.  Imaging: XR Shoulder Right  Result Date: 09/05/2019 X-rays demonstrate pseudosubluxation of the humeral head on the AP view.  She does have what appears to be somewhat of a deficient glenoid.    PMFS History: Patient Active Problem List   Diagnosis Date Noted  . CAD (coronary artery disease) 07/03/2019  . Iron deficiency anemia 06/30/2019  . 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  . NSTEMI (non-ST elevated myocardial infarction) ([place]) 05/13/2019  . Nausea 04/02/2019  . Dry eyes  04/02/2019  . Insomnia 03/13/2019  . Hypotension 03/05/2019  . Muscle spasm 02/28/2019  . Osteoarthritis of left knee 02/28/2019  . Gout 02/16/2019  . Hypothyroidism 02/16/2019  . GERD (gastroesophageal reflux disease) 02/16/2019  . Anxiety 02/16/2019  . Hypokalemia 02/16/2019  . Hyperuricemia 02/04/2019  . Dysphagia 01/16/2019  . Closed right ankle fracture 01/16/2019  . Generalized weakness 01/16/2019  . CKD (chronic kidney disease) stage 3, GFR 30-59 ml/min 11/26/2018  . Cellulitis of lower extremity   . Diastolic CHF ([place]) XX123456  . DM (diabetes mellitus), type 2 with complications ([place]) XX123456  . Hypertensive heart disease with CHF (congestive heart failure) ([place]) 09/08/2018  . Leg edema 09/08/2018  . Blood in stool 09/08/2018  . OSA (obstructive sleep apnea) 09/08/2018  . Sensorineural hearing loss (SNHL) of both ears 07/31/2017  . Lumbar radiculopathy 12/05/2016  . Diabetic polyneuropathy associated with diabetes mellitus due to underlying condition ([place]) 07/09/2015  . Vitamin deficiency related neuropathy 07/09/2015  . Hx of adenomatous colonic polyps 12/02/2010   Past Medical History:  Diagnosis Date  . Allergy    bee stings  . Anxiety   . Asthmatic bronchitis   . Broken arm 1957/2008   right  . Cataract    had surgery   . Depression   . Diabetes mellitus without complication ([place])    prediabetes diet controlled  . Diverticulosis   . GERD (gastroesophageal reflux disease)   . Glaucoma    BIL  . Hemorrhoids   . Hx of adenomatous colonic polyps 12/02/2010  . Hyperlipidemia   . Hypertension   . Hypothyroidism 05/2018  . Inflammatory arthritis   . Knee bursitis   . Migraines   . Obesity   . Osteoarthritis   . Sleep apnea     Family History  Problem Relation Age of Onset  . Colon polyps Maternal Grandfather   . Bone cancer Maternal Grandfather   . Hypertension Mother   . Diabetes Mother   . Parkinson's disease Mother   . Hypertension Father     . Diabetes Father   . Heart Problems Father   . Asthma Brother   . Diabetes Brother   . Breast cancer Brother        stage 4, both breast  . Obesity Other   . Diabetes Sister        x 2  . COPD Sister   . Asthma Sister   . Clotting disorder Sister        blood count  . Rectal cancer Neg Hx   . Stomach cancer Neg Hx   . Esophageal cancer Neg Hx     Past Surgical History:  Procedure Laterality Date  . CARPAL TUNNEL RELEASE Left    left wrist  . CATARACT EXTRACTION W/ INTRAOCULAR LENS  IMPLANT, BILATERAL Bilateral    1999 left, 2008 right  . COLONOSCOPY    .  CORONARY ARTERY BYPASS GRAFT N/A 05/15/2019   Procedure: CORONARY ARTERY BYPASS GRAFTING (CABG) times two, left internal mammary artery to left anterior descending artery and left greater saphenous vein to posterior descending artery, left sapheouns vein harvested endoscopically ;  Surgeon: Grace Isaac, MD;  Location: [facility];  Service: Open Heart Surgery;  Laterality: N/A;  . EXCISION MORTON'S NEUROMA Left 1978   left foot  . GLAUCOMA SURGERY Bilateral    and laser surgery, 2004 left, 2008 right  . LEFT HEART CATH AND CORONARY ANGIOGRAPHY N/A 05/13/2019   Procedure: LEFT HEART CATH AND CORONARY ANGIOGRAPHY;  Surgeon: [place], Peter M, MD;  Location: [facility];  Service: Cardiovascular;  Laterality: N/A;  . PARTIAL HYSTERECTOMY  1991   Left an ovary  . POLYPECTOMY    . TEE WITHOUT CARDIOVERSION N/A 05/15/2019   Procedure: TRANSESOPHAGEAL ECHOCARDIOGRAM (TEE);  Surgeon: Grace Isaac, MD;  Location: [facility];  Service: Open Heart Surgery;  Laterality: N/A;  . TOENAIL AVULSION Right    big toe   Social History   Occupational History  . Occupation: retired  Tobacco Use  . Smoking status: Former Smoker    Types: Cigarettes    Quit date: 08/30/1971    Years since quitting: 48.0  . Smokeless tobacco: Never Used  Substance and Sexual Activity  . Alcohol use: No    Alcohol/week: 0.0 standard drinks  . Drug  use: No  . Sexual activity: Not on file

## 2019-09-06 ENCOUNTER — Encounter: Payer: Self-pay | Admitting: Cardiology

## 2019-09-06 ENCOUNTER — Ambulatory Visit (INDEPENDENT_AMBULATORY_CARE_PROVIDER_SITE_OTHER): Payer: Medicare Other | Admitting: Cardiology

## 2019-09-06 VITALS — BP 102/50 | HR 90 | Ht 62.5 in | Wt 174.1 lb

## 2019-09-06 DIAGNOSIS — Z951 Presence of aortocoronary bypass graft: Secondary | ICD-10-CM

## 2019-09-06 DIAGNOSIS — I5032 Chronic diastolic (congestive) heart failure: Secondary | ICD-10-CM

## 2019-09-06 DIAGNOSIS — E785 Hyperlipidemia, unspecified: Secondary | ICD-10-CM | POA: Diagnosis not present

## 2019-09-06 DIAGNOSIS — I480 Paroxysmal atrial fibrillation: Secondary | ICD-10-CM | POA: Diagnosis not present

## 2019-09-06 MED ORDER — MAGNESIUM OXIDE 400 MG PO CAPS: 400.0000 mg | ORAL_CAPSULE | Freq: Every day | ORAL | 3 refills | Status: AC

## 2019-09-06 NOTE — Patient Instructions (Addendum)
Medication Instructions:   Your physician has recommended you make the following change in your medication:   1) Start Magnesium 400MG , 1 capsule by mouth once a day 2) Stop Potassium 72mEq   *If you need a refill on your cardiac medications before your next appointment, please call your pharmacy*  Lab Work:  None ordered today  Testing/Procedures:  None ordered today  Follow-Up: At [facility], you and your health needs are our priority.  As part of our continuing mission to provide you with exceptional heart care, we have created designated Provider Care Teams.  These Care Teams include your primary Cardiologist (physician) and Advanced Practice Providers (APPs -  Physician Assistants and Nurse Practitioners) who all work together to provide you with the care you need, when you need it.  Your next appointment:    On 01/07/20 at 9:00AM with Megan Moores, MD  Other Instructions  Lifestyle Modifications to Prevent and Treat Heart Disease -Recommend heart healthy/Mediterranean diet, with whole grains, fruits, vegetables, fish, lean meats, nuts, olive oil and avocado oil.  -Limit salt intake to less than 2000 mg per day.  -Recommend moderate walking, starting slowly with a few minutes and working up. -Recommend avoidance of tobacco products. Avoid excess alcohol. -Keep blood pressure well controlled, ideally less than 130/80.   ========================================  We are recommending the COVID-19 vaccine to all of our patients. Cardiac medications (including blood thinners) should not deter anyone from being vaccinated and there is no need to hold any of those medications prior to vaccine administration.     Currently, there is a hotline to call (active 09/06/19) to schedule vaccination appointments as no walk-ins will be accepted.   Number: [phone]    If you have further questions or concerns about the vaccine process, please visit [email or web address] or  contact your primary care physician.

## 2019-09-06 NOTE — Addendum Note (Signed)
Addended by: Precious Bard on: 09/06/2019 10:01 AM   Modules accepted: Orders

## 2019-09-23 ENCOUNTER — Encounter: Payer: Medicare Other | Admitting: Physical Medicine and Rehabilitation

## 2019-09-23 ENCOUNTER — Encounter: Payer: Self-pay | Admitting: Physical Medicine and Rehabilitation

## 2019-09-23 ENCOUNTER — Other Ambulatory Visit: Payer: Self-pay

## 2019-09-23 ENCOUNTER — Ambulatory Visit (INDEPENDENT_AMBULATORY_CARE_PROVIDER_SITE_OTHER): Payer: Medicare Other | Admitting: Physical Medicine and Rehabilitation

## 2019-09-23 ENCOUNTER — Ambulatory Visit: Payer: Self-pay

## 2019-09-23 VITALS — BP 115/65 | HR 90

## 2019-09-23 DIAGNOSIS — M47816 Spondylosis without myelopathy or radiculopathy, lumbar region: Secondary | ICD-10-CM | POA: Diagnosis not present

## 2019-09-23 MED ORDER — METHYLPREDNISOLONE ACETATE 80 MG/ML IJ SUSP
40.0000 mg | Freq: Once | INTRAMUSCULAR | Status: AC
  Administered 2019-09-23: 14:00:00 40 mg

## 2019-09-23 NOTE — Progress Notes (Signed)
.  Numeric Pain Rating Scale and Functional Assessment Average Pain 3   In the last MONTH (on 0-10 scale) has pain interfered with the following?  1. General activity like being  able to carry out your everyday physical activities such as walking, climbing stairs, carrying groceries, or moving a chair?  Rating(5)   +Driver, +BT(plavix, ok for inj), -Dye Allergies.

## 2019-09-24 NOTE — Procedures (Signed)
Lumbar Facet Joint Intra-Articular Injection(s) with Fluoroscopic Guidance  Patient: Megan [name]      Date of Birth: [birth year] MRN: [id number] PCP: [name], MD      Visit Date: 09/23/2019   Universal Protocol:    Date/Time: 09/23/2019  Consent Given By: the patient  Position: PRONE   Additional Comments: Vital signs were monitored before and after the procedure. Patient was prepped and draped in the usual sterile fashion. The correct patient, procedure, and site was verified.   Injection Procedure Details:  Procedure Site One Meds Administered:  Meds ordered this encounter  Medications  . methylPREDNISolone acetate (DEPO-MEDROL) injection 40 mg     Laterality: Left  Location/Site:  L4-L5 L5-S1  Needle size: 22 guage  Needle type: Spinal  Needle Placement: Articular  Findings:  -Comments: Excellent flow of contrast producing a partial arthrogram.  Procedure Details: The fluoroscope beam is vertically oriented in AP, and the inferior recess is visualized beneath the lower pole of the inferior apophyseal process, which represents the target point for needle insertion. When direct visualization is difficult the target point is located at the medial projection of the vertebral pedicle. The region overlying each aforementioned target is locally anesthetized with a 1 to 2 ml. volume of 1% Lidocaine without Epinephrine.   The spinal needle was inserted into each of the above mentioned facet joints using biplanar fluoroscopic guidance. A 0.25 to 0.5 ml. volume of Isovue-250 was injected and a partial facet joint arthrogram was obtained. A single spot film was obtained of the resulting arthrogram.    One to 1.25 ml of the steroid/anesthetic solution was then injected into each of the facet joints noted above.   Additional Comments:  The patient tolerated the procedure well Dressing: 2 x 2 sterile gauze and Band-Aid    Post-procedure details: Patient was  observed during the procedure. Post-procedure instructions were reviewed.  Patient left the clinic in stable condition.

## 2019-09-24 NOTE — Progress Notes (Signed)
[name] - 74 y.o. female MRN [id number]  Date of birth: [birth year]  Office Visit Note: Visit Date: 09/23/2019 PCP: [name], MD Referred by: [name], MD  Subjective: Chief Complaint  Patient presents with  . Lower Back - Pain  . Left Leg - Pain   HPI: Megan [name] is a 74 y.o. female who comes in today For planned intra-articular facet joint block at L4-5 L5-S1 on the left at the request of Dr. Eduard Roux.  Patient had similar injection couple years ago with good relief of her back pain.  She is getting axial back pain some referral in the left upper thigh but nothing past the knee no paresthesia.  She has had a rough year and a half since I have seen her.  She is probably lost over 100 pounds.  She has had myocardial infarction and now CABG and she is also had a broken ankle and lost her husband and several other friends over the last year.  MRI from 2018 showed mainly facet arthropathy with some lateral recess narrowing and moderate stenosis.  She is now on a anticoagulant which is safe to do from an injection standpoint for the facet joints.  If she does not get much relief would look at epidural injection.  She has had 1 injection for the vaccination of coronavirus and this was approximately 2 weeks ago.  We will use a very small amount of cortisone for the facet joints.  ROS Otherwise per HPI.  Assessment & Plan: Visit Diagnoses:  1. Spondylosis without myelopathy or radiculopathy, lumbar region     Plan: No additional findings.   Meds & Orders:  Meds ordered this encounter  Medications  . methylPREDNISolone acetate (DEPO-MEDROL) injection 40 mg    Orders Placed This Encounter  Procedures  . Facet Injection  . XR C-ARM NO REPORT    Follow-up: Return if symptoms worsen or fail to improve.   Procedures: No procedures performed  No notes on file   Clinical History: Lumbar spine MRIIMPRESSION: 1. Diffuse disc and facet degeneration with  moderate progression since 2006. 2. L5-S1 left facet synovial cyst impinging on the S1 nerve root from behind. 3. L4-5 moderate spinal stenosis from posterior element hypertrophy and disc bulging. Left more than right subarticular recess narrowing. Mild to moderate left foraminal stenosis. 4. L3-4 facet arthropathy with slight anterolisthesis but no impingement. 5. Noncompressive disc protrusions at T12-L1 and L2-3.   Electronically Signed   By: Monte Fantasia M.D.   On: 11/24/2016 11:01   She reports that she quit smoking about 48 years ago. Her smoking use included cigarettes. She has never used smokeless tobacco.  Recent Labs    05/13/19 0816  HGBA1C 6.3*    Objective:  VS:  HT:    WT:   BMI:     BP:115/65  HR:90bpm  TEMP: ( )  RESP:  Physical Exam  Ortho Exam Imaging: XR C-ARM NO REPORT  Result Date: 09/23/2019 Please see Notes tab for imaging impression.   Past Medical/Family/Surgical/Social History: Medications & Allergies reviewed per EMR, new medications updated. Patient Active Problem List   Diagnosis Date Noted  . CAD (coronary artery disease) 07/03/2019  . Iron deficiency anemia 06/30/2019  . 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  . NSTEMI (non-ST elevated myocardial infarction) ([place]) 05/13/2019  . Nausea 04/02/2019  . Dry eyes 04/02/2019  . Insomnia 03/13/2019  . Hypotension 03/05/2019  . Muscle spasm 02/28/2019  . Osteoarthritis of left knee  02/28/2019  . Gout 02/16/2019  . Hypothyroidism 02/16/2019  . GERD (gastroesophageal reflux disease) 02/16/2019  . Anxiety 02/16/2019  . Hypokalemia 02/16/2019  . Hyperuricemia 02/04/2019  . Dysphagia 01/16/2019  . Closed right ankle fracture 01/16/2019  . Generalized weakness 01/16/2019  . CKD (chronic kidney disease) stage 3, GFR 30-59 ml/min 11/26/2018  . Cellulitis of lower extremity   . Diastolic CHF ([place]) XX123456  . DM (diabetes mellitus), type 2 with complications ([place]) [id number]  . Hypertensive heart  disease with CHF (congestive heart failure) ([place]) 09/08/2018  . Leg edema 09/08/2018  . Blood in stool 09/08/2018  . OSA (obstructive sleep apnea) 09/08/2018  . Sensorineural hearing loss (SNHL) of both ears 07/31/2017  . Lumbar radiculopathy 12/05/2016  . Diabetic polyneuropathy associated with diabetes mellitus due to underlying condition ([place]) 07/09/2015  . Vitamin deficiency related neuropathy 07/09/2015  . Hx of adenomatous colonic polyps 12/02/2010   Past Medical History:  Diagnosis Date  . Allergy    bee stings  . Anxiety   . Asthmatic bronchitis   . Broken arm 1957/2008   right  . Cataract    had surgery   . Depression   . Diabetes mellitus without complication ([place])    prediabetes diet controlled  . Diverticulosis   . GERD (gastroesophageal reflux disease)   . Glaucoma    BIL  . Hemorrhoids   . Hx of adenomatous colonic polyps 12/02/2010  . Hyperlipidemia   . Hypertension   . Hypothyroidism 05/2018  . Inflammatory arthritis   . Knee bursitis   . Migraines   . Obesity   . Osteoarthritis   . Sleep apnea    Family History  Problem Relation Age of Onset  . Colon polyps Maternal Grandfather   . Bone cancer Maternal Grandfather   . Hypertension Mother   . Diabetes Mother   . Parkinson's disease Mother   . Hypertension Father   . Diabetes Father   . Heart Problems Father   . Asthma Brother   . Diabetes Brother   . Breast cancer Brother        stage 4, both breast  . Obesity Other   . Diabetes Sister        x 2  . COPD Sister   . Asthma Sister   . Clotting disorder Sister        blood count  . Rectal cancer Neg Hx   . Stomach cancer Neg Hx   . Esophageal cancer Neg Hx    Past Surgical History:  Procedure Laterality Date  . CARPAL TUNNEL RELEASE Left    left wrist  . CATARACT EXTRACTION W/ INTRAOCULAR LENS  IMPLANT, BILATERAL Bilateral    1999 left, 2008 right  . COLONOSCOPY    . CORONARY ARTERY BYPASS GRAFT N/A 05/15/2019   Procedure: CORONARY  ARTERY BYPASS GRAFTING (CABG) times two, left internal mammary artery to left anterior descending artery and left greater saphenous vein to posterior descending artery, left sapheouns vein harvested endoscopically ;  Surgeon: Grace Isaac, MD;  Location: [facility];  Service: Open Heart Surgery;  Laterality: N/A;  . EXCISION MORTON'S NEUROMA Left 1978   left foot  . GLAUCOMA SURGERY Bilateral    and laser surgery, 2004 left, 2008 right  . LEFT HEART CATH AND CORONARY ANGIOGRAPHY N/A 05/13/2019   Procedure: LEFT HEART CATH AND CORONARY ANGIOGRAPHY;  Surgeon: Martinique, Peter M, MD;  Location: [facility];  Service: Cardiovascular;  Laterality: N/A;  . PARTIAL HYSTERECTOMY  1991   Left an ovary  . POLYPECTOMY    . TEE WITHOUT CARDIOVERSION N/A 05/15/2019   Procedure: TRANSESOPHAGEAL ECHOCARDIOGRAM (TEE);  Surgeon: Grace Isaac, MD;  Location: [facility];  Service: Open Heart Surgery;  Laterality: N/A;  . TOENAIL AVULSION Right    big toe   Social History   Occupational History  . Occupation: retired  Tobacco Use  . Smoking status: Former Smoker    Types: Cigarettes    Quit date: 08/30/1971    Years since quitting: 48.1  . Smokeless tobacco: Never Used  Substance and Sexual Activity  . Alcohol use: No    Alcohol/week: 0.0 standard drinks  . Drug use: No  . Sexual activity: Not on file

## 2019-12-11 ENCOUNTER — Inpatient Hospital Stay (HOSPITAL_COMMUNITY)
Admission: EM | Admit: 2019-12-11 | Discharge: 2019-12-18 | DRG: 511 | Disposition: A | Discharge status: Skilled Nursing Facility | Payer: Medicare Other | Attending: Internal Medicine | Admitting: Internal Medicine

## 2019-12-11 ENCOUNTER — Emergency Department (HOSPITAL_COMMUNITY): Payer: Medicare Other

## 2019-12-11 ENCOUNTER — Other Ambulatory Visit: Payer: Self-pay

## 2019-12-11 DIAGNOSIS — E785 Hyperlipidemia, unspecified: Secondary | ICD-10-CM | POA: Diagnosis present

## 2019-12-11 DIAGNOSIS — N183 Chronic kidney disease, stage 3 unspecified: Secondary | ICD-10-CM | POA: Diagnosis present

## 2019-12-11 DIAGNOSIS — S52202A Unspecified fracture of shaft of left ulna, initial encounter for closed fracture: Secondary | ICD-10-CM | POA: Diagnosis present

## 2019-12-11 DIAGNOSIS — Z7984 Long term (current) use of oral hypoglycemic drugs: Secondary | ICD-10-CM

## 2019-12-11 DIAGNOSIS — Z91030 Bee allergy status: Secondary | ICD-10-CM

## 2019-12-11 DIAGNOSIS — Z833 Family history of diabetes mellitus: Secondary | ICD-10-CM

## 2019-12-11 DIAGNOSIS — Z90710 Acquired absence of both cervix and uterus: Secondary | ICD-10-CM

## 2019-12-11 DIAGNOSIS — Z7902 Long term (current) use of antithrombotics/antiplatelets: Secondary | ICD-10-CM

## 2019-12-11 DIAGNOSIS — D509 Iron deficiency anemia, unspecified: Secondary | ICD-10-CM | POA: Diagnosis present

## 2019-12-11 DIAGNOSIS — F419 Anxiety disorder, unspecified: Secondary | ICD-10-CM | POA: Diagnosis present

## 2019-12-11 DIAGNOSIS — Z886 Allergy status to analgesic agent status: Secondary | ICD-10-CM

## 2019-12-11 DIAGNOSIS — I152 Hypertension secondary to endocrine disorders: Secondary | ICD-10-CM | POA: Diagnosis present

## 2019-12-11 DIAGNOSIS — E669 Obesity, unspecified: Secondary | ICD-10-CM | POA: Diagnosis present

## 2019-12-11 DIAGNOSIS — Z880 Allergy status to penicillin: Secondary | ICD-10-CM

## 2019-12-11 DIAGNOSIS — K5900 Constipation, unspecified: Secondary | ICD-10-CM | POA: Diagnosis not present

## 2019-12-11 DIAGNOSIS — S5290XA Unspecified fracture of unspecified forearm, initial encounter for closed fracture: Secondary | ICD-10-CM | POA: Diagnosis present

## 2019-12-11 DIAGNOSIS — I130 Hypertensive heart and chronic kidney disease with heart failure and stage 1 through stage 4 chronic kidney disease, or unspecified chronic kidney disease: Secondary | ICD-10-CM | POA: Diagnosis present

## 2019-12-11 DIAGNOSIS — I2510 Atherosclerotic heart disease of native coronary artery without angina pectoris: Secondary | ICD-10-CM | POA: Diagnosis present

## 2019-12-11 DIAGNOSIS — S5292XA Unspecified fracture of left forearm, initial encounter for closed fracture: Secondary | ICD-10-CM | POA: Diagnosis not present

## 2019-12-11 DIAGNOSIS — K219 Gastro-esophageal reflux disease without esophagitis: Secondary | ICD-10-CM | POA: Diagnosis present

## 2019-12-11 DIAGNOSIS — E118 Type 2 diabetes mellitus with unspecified complications: Secondary | ICD-10-CM

## 2019-12-11 DIAGNOSIS — E1122 Type 2 diabetes mellitus with diabetic chronic kidney disease: Secondary | ICD-10-CM | POA: Diagnosis present

## 2019-12-11 DIAGNOSIS — M109 Gout, unspecified: Secondary | ICD-10-CM | POA: Diagnosis present

## 2019-12-11 DIAGNOSIS — Z79899 Other long term (current) drug therapy: Secondary | ICD-10-CM

## 2019-12-11 DIAGNOSIS — Z888 Allergy status to other drugs, medicaments and biological substances status: Secondary | ICD-10-CM

## 2019-12-11 DIAGNOSIS — Z91048 Other nonmedicinal substance allergy status: Secondary | ICD-10-CM

## 2019-12-11 DIAGNOSIS — Z882 Allergy status to sulfonamides status: Secondary | ICD-10-CM

## 2019-12-11 DIAGNOSIS — S52352A Displaced comminuted fracture of shaft of radius, left arm, initial encounter for closed fracture: Principal | ICD-10-CM | POA: Diagnosis present

## 2019-12-11 DIAGNOSIS — I5032 Chronic diastolic (congestive) heart failure: Secondary | ICD-10-CM | POA: Diagnosis present

## 2019-12-11 DIAGNOSIS — E1169 Type 2 diabetes mellitus with other specified complication: Secondary | ICD-10-CM | POA: Diagnosis present

## 2019-12-11 DIAGNOSIS — S52252A Displaced comminuted fracture of shaft of ulna, left arm, initial encounter for closed fracture: Secondary | ICD-10-CM | POA: Diagnosis present

## 2019-12-11 DIAGNOSIS — E1159 Type 2 diabetes mellitus with other circulatory complications: Secondary | ICD-10-CM | POA: Diagnosis present

## 2019-12-11 DIAGNOSIS — Z79890 Hormone replacement therapy (postmenopausal): Secondary | ICD-10-CM

## 2019-12-11 DIAGNOSIS — W1830XA Fall on same level, unspecified, initial encounter: Secondary | ICD-10-CM | POA: Diagnosis present

## 2019-12-11 DIAGNOSIS — Z951 Presence of aortocoronary bypass graft: Secondary | ICD-10-CM

## 2019-12-11 DIAGNOSIS — E1142 Type 2 diabetes mellitus with diabetic polyneuropathy: Secondary | ICD-10-CM | POA: Diagnosis present

## 2019-12-11 DIAGNOSIS — G4733 Obstructive sleep apnea (adult) (pediatric): Secondary | ICD-10-CM | POA: Diagnosis present

## 2019-12-11 DIAGNOSIS — I252 Old myocardial infarction: Secondary | ICD-10-CM

## 2019-12-11 DIAGNOSIS — K649 Unspecified hemorrhoids: Secondary | ICD-10-CM | POA: Diagnosis present

## 2019-12-11 DIAGNOSIS — Z6830 Body mass index (BMI) 30.0-30.9, adult: Secondary | ICD-10-CM

## 2019-12-11 DIAGNOSIS — Z22322 Carrier or suspected carrier of Methicillin resistant Staphylococcus aureus: Secondary | ICD-10-CM

## 2019-12-11 DIAGNOSIS — Z885 Allergy status to narcotic agent status: Secondary | ICD-10-CM

## 2019-12-11 DIAGNOSIS — H4089 Other specified glaucoma: Secondary | ICD-10-CM | POA: Diagnosis present

## 2019-12-11 DIAGNOSIS — Z8249 Family history of ischemic heart disease and other diseases of the circulatory system: Secondary | ICD-10-CM

## 2019-12-11 DIAGNOSIS — Z20822 Contact with and (suspected) exposure to covid-19: Secondary | ICD-10-CM | POA: Diagnosis present

## 2019-12-11 DIAGNOSIS — E039 Hypothyroidism, unspecified: Secondary | ICD-10-CM | POA: Diagnosis present

## 2019-12-11 DIAGNOSIS — H903 Sensorineural hearing loss, bilateral: Secondary | ICD-10-CM | POA: Diagnosis present

## 2019-12-11 DIAGNOSIS — G4700 Insomnia, unspecified: Secondary | ICD-10-CM | POA: Diagnosis present

## 2019-12-11 DIAGNOSIS — Z87891 Personal history of nicotine dependence: Secondary | ICD-10-CM

## 2019-12-11 DIAGNOSIS — R0789 Other chest pain: Secondary | ICD-10-CM | POA: Diagnosis not present

## 2019-12-11 LAB — BASIC METABOLIC PANEL
Anion gap: 14 (ref 5–15)
BUN: 28 mg/dL — ABNORMAL HIGH (ref 8–23)
CO2: 25 mmol/L (ref 22–32)
Calcium: 9.8 mg/dL (ref 8.9–10.3)
Chloride: 102 mmol/L (ref 98–111)
Creatinine, Ser: 0.99 mg/dL (ref 0.44–1.00)
GFR calc Af Amer: >60 mL/min (ref >60)
GFR calc non Af Amer: 56 mL/min — ABNORMAL LOW (ref >60)
Glucose, Bld: 139 mg/dL — ABNORMAL HIGH (ref 70–99)
Potassium: 4.3 mmol/L (ref 3.5–5.1)
Sodium: 141 mmol/L (ref 135–145)

## 2019-12-11 LAB — CBC WITH DIFFERENTIAL/PLATELET
Abs Immature Granulocytes: 0.02 10*3/uL (ref 0.00–0.07)
Basophils Absolute: 0 10*3/uL (ref 0.0–0.1)
Basophils Relative: 0 %
Eosinophils Absolute: 0 10*3/uL (ref 0.0–0.5)
Eosinophils Relative: 1 %
HCT: 40.4 % (ref 36.0–46.0)
Hemoglobin: 12.2 g/dL (ref 12.0–15.0)
Immature Granulocytes: 0 %
Lymphocytes Relative: 18 %
Lymphs Abs: 1.4 10*3/uL (ref 0.7–4.0)
MCH: 29.7 pg (ref 26.0–34.0)
MCHC: 30.2 g/dL (ref 30.0–36.0)
MCV: 98.3 fL (ref 80.0–100.0)
Monocytes Absolute: 0.5 10*3/uL (ref 0.1–1.0)
Monocytes Relative: 6 %
Neutro Abs: 6.2 10*3/uL (ref 1.7–7.7)
Neutrophils Relative %: 75 %
Platelets: 260 10*3/uL (ref 150–400)
RBC: 4.11 MIL/uL (ref 3.87–5.11)
RDW: 13.6 % (ref 11.5–15.5)
WBC: 8.1 10*3/uL (ref 4.0–10.5)
nRBC: 0 % (ref 0.0–0.2)

## 2019-12-11 MED ORDER — MORPHINE SULFATE (PF) 4 MG/ML IV SOLN
4.0000 mg | Freq: Once | INTRAVENOUS | Status: AC
  Administered 2019-12-11: 4 mg via INTRAVENOUS
  Filled 2019-12-11: qty 1

## 2019-12-11 NOTE — ED Triage Notes (Signed)
Pt called ems due to a fall while bending over her lift chair. Pt was sitting in her lift chair and it was not working so she stood up and bent over the back of the chair to check to see if it was plugged in, while lending over she fell and landed on her left arm and hit her head. Pt is on blood thinners. Pt has deformity to left arm per ems. Pt is alert and ox4.

## 2019-12-11 NOTE — Consult Note (Signed)
The patient sustained a closed both bone forearm fracture.  Patient's radiographs were reviewed.  Her notes were reviewed and her care was discussed with Dr. Tomi Bamberger. The patient will require surgical stabilization of the fracture. The ER's concern is about her going home and not being able to use her left arm.  We will plan for surgery on her left arm likely on Friday.  We have to coordinate time with the operating room.  This is a nonemergent surgery and we will try to do during her time of hospitalization.  This is a procedure she can go home and come back for surgery but I think with the underlying social circumstances and her mobility the ER feels it is wise for her to stay in the hospital until the surgical procedure can be done.  She is per the notes on blood thinners, Plavix. She is a diabetic.  She has a long active problem list to include coronary artery disease.  We will ask the internist for risk assessment prior to surgery.  The ER was going to put her in a sugar tong splint ice elevation.  We will coordinate with the operating room at time for the surgical intervention.  Please contact me if you have any questions regarding the patient's arm [phone]

## 2019-12-11 NOTE — ED Provider Notes (Signed)
[facility] EMERGENCY DEPARTMENT Provider Note   CSN: [id number] Arrival date & time: 12/11/19  1654     History No chief complaint on file.   Megan [name] is a 74 y.o. female.  HPI   Patient presented to the ED for evaluation of a fall and an injury.  Patient was bending over from her lift chair.  She ended up falling forward and striking her head as well as her left forearm.  Patient also landed on her left knee.  Patient states she takes Plavix.  Patient is pretty certain that she broke her left arm.  She denies any numbness or weakness.  No loss of consciousness.  No nausea or vomiting.  Past Medical History:  Diagnosis Date  . Allergy    bee stings  . Anxiety   . Asthmatic bronchitis   . Broken arm 1957/2008   right  . Cataract    had surgery   . Depression   . Diabetes mellitus without complication ([place])    prediabetes diet controlled  . Diverticulosis   . GERD (gastroesophageal reflux disease)   . Glaucoma    BIL  . Hemorrhoids   . Hx of adenomatous colonic polyps 12/02/2010  . Hyperlipidemia   . Hypertension   . Hypothyroidism 05/2018  . Inflammatory arthritis   . Knee bursitis   . Migraines   . Obesity   . Osteoarthritis   . Sleep apnea     Patient Active Problem List   Diagnosis Date Noted  . CAD (coronary artery disease) 07/03/2019  . Iron deficiency anemia 06/30/2019  . 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  . NSTEMI (non-ST elevated myocardial infarction) ([place]) 05/13/2019  . Nausea 04/02/2019  . Dry eyes 04/02/2019  . Insomnia 03/13/2019  . Hypotension 03/05/2019  . Muscle spasm 02/28/2019  . Osteoarthritis of left knee 02/28/2019  . Gout 02/16/2019  . Hypothyroidism 02/16/2019  . GERD (gastroesophageal reflux disease) 02/16/2019  . Anxiety 02/16/2019  . Hypokalemia 02/16/2019  . Hyperuricemia 02/04/2019  . Dysphagia 01/16/2019  . Closed right ankle fracture 01/16/2019  . Generalized weakness 01/16/2019  . CKD (chronic kidney  disease) stage 3, GFR 30-59 ml/min 11/26/2018  . Cellulitis of lower extremity   . Diastolic CHF ([place]) XX123456  . DM (diabetes mellitus), type 2 with complications ([place]) XX123456  . Hypertensive heart disease with CHF (congestive heart failure) ([place]) 09/08/2018  . Leg edema 09/08/2018  . Blood in stool 09/08/2018  . OSA (obstructive sleep apnea) 09/08/2018  . Sensorineural hearing loss (SNHL) of both ears 07/31/2017  . Lumbar radiculopathy 12/05/2016  . Diabetic polyneuropathy associated with diabetes mellitus due to underlying condition ([place]) 07/09/2015  . Vitamin deficiency related neuropathy 07/09/2015  . Hx of adenomatous colonic polyps 12/02/2010    Past Surgical History:  Procedure Laterality Date  . CARPAL TUNNEL RELEASE Left    left wrist  . CATARACT EXTRACTION W/ INTRAOCULAR LENS  IMPLANT, BILATERAL Bilateral    1999 left, 2008 right  . COLONOSCOPY    . CORONARY ARTERY BYPASS GRAFT N/A 05/15/2019   Procedure: CORONARY ARTERY BYPASS GRAFTING (CABG) times two, left internal mammary artery to left anterior descending artery and left greater saphenous vein to posterior descending artery, left sapheouns vein harvested endoscopically ;  Surgeon: Grace Isaac, MD;  Location: [facility];  Service: Open Heart Surgery;  Laterality: N/A;  . EXCISION MORTON'S NEUROMA Left 1978   left foot  . GLAUCOMA SURGERY Bilateral    and laser surgery,  2004 left, 2008 right  . LEFT HEART CATH AND CORONARY ANGIOGRAPHY N/A 05/13/2019   Procedure: LEFT HEART CATH AND CORONARY ANGIOGRAPHY;  Surgeon: Martinique, Peter M, MD;  Location: [facility];  Service: Cardiovascular;  Laterality: N/A;  . PARTIAL HYSTERECTOMY  1991   Left an ovary  . POLYPECTOMY    . TEE WITHOUT CARDIOVERSION N/A 05/15/2019   Procedure: TRANSESOPHAGEAL ECHOCARDIOGRAM (TEE);  Surgeon: Grace Isaac, MD;  Location: [facility];  Service: Open Heart Surgery;  Laterality: N/A;  . TOENAIL AVULSION Right    big toe     OB  History   No obstetric history on file.     Family History  Problem Relation Age of Onset  . Colon polyps Maternal Grandfather   . Bone cancer Maternal Grandfather   . Hypertension Mother   . Diabetes Mother   . Parkinson's disease Mother   . Hypertension Father   . Diabetes Father   . Heart Problems Father   . Asthma Brother   . Diabetes Brother   . Breast cancer Brother        stage 4, both breast  . Obesity Other   . Diabetes Sister        x 2  . COPD Sister   . Asthma Sister   . Clotting disorder Sister        blood count  . Rectal cancer Neg Hx   . Stomach cancer Neg Hx   . Esophageal cancer Neg Hx     Social History   Tobacco Use  . Smoking status: Former Smoker    Types: Cigarettes    Quit date: 08/30/1971    Years since quitting: 48.3  . Smokeless tobacco: Never Used  Substance Use Topics  . Alcohol use: No    Alcohol/week: 0.0 standard drinks  . Drug use: No    Home Medications Prior to Admission medications   Medication Sig Start Date End Date Taking? Authorizing Provider  acetaminophen (TYLENOL) 650 MG CR tablet Take 650 mg by mouth every 8 (eight) hours.    Yes [provider]  allopurinol (ZYLOPRIM) 100 MG tablet Take 1 tablet (100 mg total) by mouth daily. 04/02/19  Yes Medina-Vargas, Monina C, NP  atorvastatin (LIPITOR) 80 MG tablet Take 1 tablet (80 mg total) by mouth daily at 6 PM. 05/28/19  Yes Roddenberry, Myron G, PA-C  calcium carbonate (TUMS - DOSED IN MG ELEMENTAL CALCIUM) 500 MG chewable tablet Chew 1-2 tablets by mouth as needed for indigestion or heartburn.   Yes [provider]  clopidogrel (PLAVIX) 75 MG tablet Take 1 tablet (75 mg total) by mouth daily. 05/29/19  Yes Roddenberry, Arlis Porta, PA-C  diclofenac sodium (VOLTAREN) 1 % GEL Apply 2 gm topically to left knee Patient taking differently: Apply 2 g topically 2 (two) times daily as needed (for left knee pain).  04/02/19  Yes Medina-Vargas, Monina C, NP  EPINEPHrine 0.3  mg/0.3 mL IJ SOAJ injection Inject 0.3 mg into the muscle once as needed for anaphylaxis.    Yes [provider]  famotidine (PEPCID) 20 MG tablet Take 20 mg by mouth 2 (two) times daily. 11/20/19  Yes [provider]  furosemide (LASIX) 40 MG tablet Take 1 tablet (40 mg total) by mouth 2 (two) times daily. Patient taking differently: Take 40-80 mg by mouth See admin instructions. Take 80 mg by mouth in the morning and 40 mg with supper 07/03/19 12/30/19 Yes Nahser, Wonda Cheng, MD  gabapentin (  NEURONTIN) 100 MG capsule Take 100 mg by mouth every 8 (eight) hours as needed (for pain).   Yes [provider]  levothyroxine (SYNTHROID) 75 MCG tablet Take 75 mcg by mouth daily before breakfast.   Yes [provider]  Lidocaine (ASPERCREME LIDOCAINE) 4 % PTCH Place 1 patch onto the skin daily. APPLY TOPICALLY TO LOWER BACK ONCE DAILY FOR PAIN Patient taking differently: Place 1 patch onto the skin See admin instructions. Apply 1 patch to the lower back once a day 04/02/19  Yes Medina-Vargas, Monina C, NP  LORazepam (ATIVAN) 0.5 MG tablet Take 0.5 mg by mouth in the morning and at bedtime.    Yes [provider]  losartan (COZAAR) 25 MG tablet Take 0.5 tablets (12.5 mg total) by mouth daily. 06/06/19  Yes Daune Perch, NP  Magnesium Oxide 400 MG CAPS Take 1 capsule (400 mg total) by mouth daily. Patient taking differently: Take 400 mg by mouth in the morning and at bedtime.  09/06/19  Yes Daune Perch, NP  meclizine (ANTIVERT) 25 MG tablet Take 25 mg by mouth 3 (three) times daily as needed for dizziness.  10/28/19  Yes [provider]  Melatonin 3 MG TABS Take 1 tablet (3 mg total) by mouth at bedtime. FOR INSOMNIA Patient taking differently: Take 3 mg by mouth at bedtime.  04/02/19  Yes Medina-Vargas, Monina C, NP  metFORMIN (GLUCOPHAGE) 500 MG tablet Take 1 tablet (500 mg total) by mouth 2 (two) times daily with a meal. 04/02/19  Yes Medina-Vargas, Monina C, NP   metoprolol succinate (TOPROL XL) 25 MG 24 hr tablet Take 1 tablet (25 mg total) by mouth daily. 06/06/19  Yes Daune Perch, NP  Multiple Vitamins-Iron (MULTIVITAMINS WITH IRON) TABS tablet Take 1 tablet by mouth daily. 05/28/19  Yes Roddenberry, Myron G, PA-C  ondansetron (ZOFRAN) 4 MG tablet Take 1 tablet (4 mg total) by mouth every 6 (six) hours as needed for nausea or vomiting. GIVE 1 TABLET BY MOUTH EVERY 6 HOURS AS NEEDED FOR NAUSEA/VOMITING Patient taking differently: Take 4 mg by mouth every 6 (six) hours as needed for nausea or vomiting.  04/02/19  Yes Medina-Vargas, Monina C, NP  polyvinyl alcohol (LIQUIFILM TEARS) 1.4 % ophthalmic solution Place 1 drop into both eyes every 4 (four) hours as needed for dry eyes. 04/02/19  Yes Medina-Vargas, Monina C, NP  Pyridoxine HCl (VITAMIN B6 PO) Take 1 tablet by mouth daily.   Yes [provider]  spironolactone (ALDACTONE) 25 MG tablet Take 12.5 mg by mouth at bedtime.    Yes [provider]  vitamin B-12 (CYANOCOBALAMIN) 1000 MCG tablet 1 tab by orally daily Patient taking differently: Take 1,000 mcg by mouth daily.  04/02/19  Yes Medina-Vargas, Monina C, NP  Vitamin D, Ergocalciferol, (DRISDOL) 1.25 MG (50000 UNIT) CAPS capsule Take 50,000 Units by mouth every Thursday.   Yes [provider]  ONETOUCH VERIO test strip USE TO TEST BLOOD GLUCOSE ONCE DAILY 04/02/19   Medina-Vargas, Monina C, NP  pantoprazole (PROTONIX) 40 MG tablet Take 1 tablet (40 mg total) by mouth daily. Patient not taking: Reported on 12/11/2019 04/02/19   Medina-Vargas, Monina C, NP    Allergies    Aspirin, Bee pollen, Codeine, Fish oil, Sulfa antibiotics, Hornet venom, Iron, Pantoprazole, Tape, Camphor, Lisinopril, Penicillins, Sulfasalazine, and Vicodin [hydrocodone-acetaminophen]  Review of Systems   Review of Systems  All other systems reviewed and are negative.   Physical Exam Updated Vital Signs BP (!) 126/58   Pulse  77   Temp (!) 97.2 F (36.2  C) (Temporal)   Resp 16   SpO2 100%   Physical Exam Vitals and nursing note reviewed.  Constitutional:      General: She is not in acute distress.    Appearance: She is well-developed.  HENT:     Head: Normocephalic.     Comments: Mild tenderness palpation left temporal region, slight bruising noted    Right Ear: External ear normal.     Left Ear: External ear normal.  Eyes:     General: No scleral icterus.       Right eye: No discharge.        Left eye: No discharge.     Conjunctiva/sclera: Conjunctivae normal.  Neck:     Trachea: No tracheal deviation.  Cardiovascular:     Rate and Rhythm: Normal rate and regular rhythm.  Pulmonary:     Effort: Pulmonary effort is normal. No respiratory distress.     Breath sounds: Normal breath sounds. No stridor. No wheezing or rales.  Abdominal:     General: Bowel sounds are normal. There is no distension.     Palpations: Abdomen is soft.     Tenderness: There is no abdominal tenderness. There is no guarding or rebound.  Musculoskeletal:     Right elbow: Normal.     Left elbow: Normal.     Left forearm: Swelling, deformity and tenderness present.     Left wrist: Normal.     Cervical back: Normal and neck supple.     Thoracic back: Normal.     Lumbar back: Normal.     Left lower leg: Tenderness present. No deformity.  Skin:    General: Skin is warm and dry.     Findings: No rash.  Neurological:     Mental Status: She is alert.     Cranial Nerves: No cranial nerve deficit (no facial droop, extraocular movements intact, no slurred speech).     Sensory: No sensory deficit.     Motor: No abnormal muscle tone or seizure activity.     Coordination: Coordination normal.     ED Results / Procedures / Treatments   Labs (all labs ordered are listed, but only abnormal results are displayed) Labs Reviewed  SARS CORONAVIRUS 2 (TAT 6-24 HRS)  CBC WITH DIFFERENTIAL/PLATELET  BASIC METABOLIC PANEL    EKG None  Radiology DG Forearm  Left  Result Date: 12/11/2019 CLINICAL DATA:  74 year old female status post fall with pain. EXAM: LEFT FOREARM - 2 VIEW COMPARISON:  None. FINDINGS: Transverse but comminuted both-bone forearm fracture. Midshaft fractures of the left radius and ulna. 1/2 shaft with posterior displacement of the ulna, with similar anterior displacement but posterior angulation of the radius fracture. Alignment appears preserved at the left elbow and wrist. No other acute osseous abnormality identified. IMPRESSION: Acute transverse and mildly comminuted midshaft fractures of the left radius and ulna. Electronically Signed   By: Genevie Ann M.D.   On: 12/11/2019 19:06   CT Head Wo Contrast  Result Date: 12/11/2019 CLINICAL DATA:  Fill over chair, with positive head strike EXAM: CT HEAD WITHOUT CONTRAST TECHNIQUE: Contiguous axial images were obtained from the base of the skull through the vertex without intravenous contrast. COMPARISON:  CT 09/20/2013 FINDINGS: Brain: No evidence of acute infarction, hemorrhage, hydrocephalus, extra-axial collection or mass lesion/mass effect. Symmetric prominence of the ventricles, cisterns and sulci compatible with parenchymal volume loss. Patchy areas of white matter hypoattenuation are most compatible with chronic  microvascular angiopathy. Vascular: Atherosclerotic calcification of the carotid siphons. No hyperdense vessel. Skull: No significant scalp swelling, hematoma or calvarial fracture. Moderate hyperostosis frontalis interna, a benign incidental finding. No worrisome or suspicious osseous lesions. Sinuses/Orbits: Paranasal sinuses and mastoid air cells are predominantly clear. Pneumatization of the petrous apices noted incidentally. Orbital structures are unremarkable aside from prior lens extractions. Other: None IMPRESSION: 1. No acute intracranial findings. No significant scalp swelling, hematoma, or calvarial fracture. 2. Mild parenchymal volume loss and chronic microvascular  angiopathy. Electronically Signed   By: Lovena Le M.D.   On: 12/11/2019 20:43   CT Cervical Spine Wo Contrast  Result Date: 12/11/2019 CLINICAL DATA:  [place] over chair with positive head strike EXAM: CT CERVICAL SPINE WITHOUT CONTRAST TECHNIQUE: Multidetector CT imaging of the cervical spine was performed without intravenous contrast. Multiplanar CT image reconstructions were also generated. COMPARISON:  Cervical spine radiographs Jan 03, 2006 FINDINGS: Alignment: Preservation of the normal cervical lordosis without traumatic listhesis. No abnormally widened, perched or jumped facets. Normal alignment of the craniocervical and atlantoaxial articulations. Skull base and vertebrae: No acute fracture. No primary bone lesion or focal pathologic process. Incomplete fusion of the spinous process of C6. Soft tissues and spinal canal: No pre or paravertebral fluid or swelling. No visible canal hematoma. Disc levels: Minimal spondylitic changes. No significant central canal or foraminal stenosis identified within the imaged levels of the spine. Mild-to-moderate atlantodental arthrosis. Upper chest: No acute abnormality in the upper chest or imaged lung apices. Other: Normal thyroid. Cervical carotid atherosclerosis seen bilaterally at the bifurcations. IMPRESSION: 1. No acute fracture or traumatic listhesis of the cervical spine. 2. Minimal spondylitic changes. 3. Cervical carotid atherosclerosis. Electronically Signed   By: Lovena Le M.D.   On: 12/11/2019 20:46   DG Knee Complete 4 Views Left  Result Date: 12/11/2019 CLINICAL DATA:  74 year old female status post fall with pain. EXAM: LEFT KNEE - COMPLETE 4+ VIEW COMPARISON:  01/08/2019. FINDINGS: No joint effusion on the cross-table lateral view. New surgical clips in the medial popliteal region since last year. Chronic tricompartmental degenerative spurring. Osteopenia. No acute osseous abnormality identified. No discrete soft tissue injury. IMPRESSION: No  acute fracture or dislocation identified about the left knee. Electronically Signed   By: Genevie Ann M.D.   On: 12/11/2019 19:04    Procedures Procedures (including critical care time)  Medications Ordered in ED Medications  morphine 4 MG/ML injection 4 mg (4 mg Intravenous Given 12/11/19 1938)  morphine 4 MG/ML injection 4 mg (4 mg Intravenous Given 12/11/19 2315)    ED Course  I have reviewed the triage vital signs and the nursing notes.  Pertinent labs & imaging results that were available during my care of the patient were reviewed by me and considered in my medical decision making (see chart for details).  Clinical Course as of Dec 11 2327  Wed Dec 11, 2019  2050 Tray findings reviewed.  Knee negative.  Forearm shows midshaft radial ulnar fracture   [JK]  2050 CT scans without acute findings.   [JK]  2050 Patient is concerned about her ability to care for herself at home.  She states she needs both arms to stand up.  Patient was using a lift chair.  I will consult with case management.   [JK]  2117 Case discussed with Dr Apolonio Schneiders.  Pt will need surgery on her arm.  Could do firday if she stays in the hospital   [JK]    Clinical Course User Index [  JK] Dorie Rank, MD   MDM Rules/Calculators/A&P                      Patient presented to the ED for evaluation after a fall.  ED work-up was notable for a midshaft forearm fracture of her ulna and radius.  Patient was splinted.  Case was discussed with Dr. Apolonio Schneiders.  Unfortunately patient does not feel like she can manage at home.  She needs both hands to get herself up.  She lives by herself.  Consulted with case management.  I will talk to the medical service about admission to the hospital as Dr. Apolonio Schneiders plans on operative intervention. Final Clinical Impression(s) / ED Diagnoses Final diagnoses:  Closed fracture of left radius and ulna, initial encounter    Rx / DC Orders ED Discharge Orders    None       Dorie Rank, MD 12/11/19  2329

## 2019-12-11 NOTE — Progress Notes (Signed)
Orthopedic Tech Progress Note Patient Details:  Megan [name] 04-May-1946 [id number]  Ortho Devices Type of Ortho Device: Sugartong splint Ortho Device/Splint Location: rue Ortho Device/Splint Interventions: Ordered, Application, Adjustment   Post Interventions Patient Tolerated: Well Instructions Provided: Care of device, Adjustment of device   Karolee Stamps 12/11/2019, 10:50 PM

## 2019-12-11 NOTE — ED Notes (Signed)
Pt being taken to radiology for imaging.

## 2019-12-12 DIAGNOSIS — K649 Unspecified hemorrhoids: Secondary | ICD-10-CM | POA: Diagnosis present

## 2019-12-12 DIAGNOSIS — I2510 Atherosclerotic heart disease of native coronary artery without angina pectoris: Secondary | ICD-10-CM | POA: Diagnosis present

## 2019-12-12 DIAGNOSIS — E785 Hyperlipidemia, unspecified: Secondary | ICD-10-CM | POA: Diagnosis present

## 2019-12-12 DIAGNOSIS — I152 Hypertension secondary to endocrine disorders: Secondary | ICD-10-CM | POA: Diagnosis present

## 2019-12-12 DIAGNOSIS — S52202A Unspecified fracture of shaft of left ulna, initial encounter for closed fracture: Secondary | ICD-10-CM | POA: Diagnosis present

## 2019-12-12 DIAGNOSIS — Z20822 Contact with and (suspected) exposure to covid-19: Secondary | ICD-10-CM | POA: Diagnosis present

## 2019-12-12 DIAGNOSIS — I252 Old myocardial infarction: Secondary | ICD-10-CM | POA: Diagnosis not present

## 2019-12-12 DIAGNOSIS — I130 Hypertensive heart and chronic kidney disease with heart failure and stage 1 through stage 4 chronic kidney disease, or unspecified chronic kidney disease: Secondary | ICD-10-CM | POA: Diagnosis present

## 2019-12-12 DIAGNOSIS — S5292XA Unspecified fracture of left forearm, initial encounter for closed fracture: Secondary | ICD-10-CM

## 2019-12-12 DIAGNOSIS — F419 Anxiety disorder, unspecified: Secondary | ICD-10-CM | POA: Diagnosis present

## 2019-12-12 DIAGNOSIS — E1169 Type 2 diabetes mellitus with other specified complication: Secondary | ICD-10-CM | POA: Diagnosis present

## 2019-12-12 DIAGNOSIS — S52352A Displaced comminuted fracture of shaft of radius, left arm, initial encounter for closed fracture: Secondary | ICD-10-CM | POA: Diagnosis present

## 2019-12-12 DIAGNOSIS — G4700 Insomnia, unspecified: Secondary | ICD-10-CM | POA: Diagnosis present

## 2019-12-12 DIAGNOSIS — K5900 Constipation, unspecified: Secondary | ICD-10-CM | POA: Diagnosis not present

## 2019-12-12 DIAGNOSIS — S5290XA Unspecified fracture of unspecified forearm, initial encounter for closed fracture: Secondary | ICD-10-CM | POA: Diagnosis present

## 2019-12-12 DIAGNOSIS — W1830XA Fall on same level, unspecified, initial encounter: Secondary | ICD-10-CM | POA: Diagnosis present

## 2019-12-12 DIAGNOSIS — E039 Hypothyroidism, unspecified: Secondary | ICD-10-CM | POA: Diagnosis present

## 2019-12-12 DIAGNOSIS — E1122 Type 2 diabetes mellitus with diabetic chronic kidney disease: Secondary | ICD-10-CM | POA: Diagnosis present

## 2019-12-12 DIAGNOSIS — R0789 Other chest pain: Secondary | ICD-10-CM | POA: Diagnosis not present

## 2019-12-12 DIAGNOSIS — D509 Iron deficiency anemia, unspecified: Secondary | ICD-10-CM | POA: Diagnosis present

## 2019-12-12 DIAGNOSIS — I5032 Chronic diastolic (congestive) heart failure: Secondary | ICD-10-CM | POA: Diagnosis present

## 2019-12-12 DIAGNOSIS — M109 Gout, unspecified: Secondary | ICD-10-CM | POA: Diagnosis present

## 2019-12-12 DIAGNOSIS — S52252A Displaced comminuted fracture of shaft of ulna, left arm, initial encounter for closed fracture: Secondary | ICD-10-CM | POA: Diagnosis present

## 2019-12-12 DIAGNOSIS — E669 Obesity, unspecified: Secondary | ICD-10-CM | POA: Diagnosis present

## 2019-12-12 DIAGNOSIS — N183 Chronic kidney disease, stage 3 unspecified: Secondary | ICD-10-CM | POA: Diagnosis present

## 2019-12-12 DIAGNOSIS — K219 Gastro-esophageal reflux disease without esophagitis: Secondary | ICD-10-CM | POA: Diagnosis present

## 2019-12-12 DIAGNOSIS — H4089 Other specified glaucoma: Secondary | ICD-10-CM | POA: Diagnosis present

## 2019-12-12 DIAGNOSIS — N1831 Chronic kidney disease, stage 3a: Secondary | ICD-10-CM | POA: Diagnosis not present

## 2019-12-12 DIAGNOSIS — E1159 Type 2 diabetes mellitus with other circulatory complications: Secondary | ICD-10-CM | POA: Diagnosis present

## 2019-12-12 DIAGNOSIS — G4733 Obstructive sleep apnea (adult) (pediatric): Secondary | ICD-10-CM | POA: Diagnosis present

## 2019-12-12 DIAGNOSIS — E118 Type 2 diabetes mellitus with unspecified complications: Secondary | ICD-10-CM | POA: Diagnosis not present

## 2019-12-12 LAB — URINALYSIS, ROUTINE W REFLEX MICROSCOPIC
Bacteria, UA: NONE SEEN
Bilirubin Urine: NEGATIVE
Glucose, UA: NEGATIVE mg/dL
Hgb urine dipstick: NEGATIVE
Ketones, ur: NEGATIVE mg/dL
Nitrite: NEGATIVE
Protein, ur: NEGATIVE mg/dL
Specific Gravity, Urine: 1.016 (ref 1.005–1.030)
pH: 5 (ref 5.0–8.0)

## 2019-12-12 LAB — CBC
HCT: 33.5 % — ABNORMAL LOW (ref 36.0–46.0)
Hemoglobin: 10.5 g/dL — ABNORMAL LOW (ref 12.0–15.0)
MCH: 29.7 pg (ref 26.0–34.0)
MCHC: 31.3 g/dL (ref 30.0–36.0)
MCV: 94.9 fL (ref 80.0–100.0)
Platelets: 244 10*3/uL (ref 150–400)
RBC: 3.53 MIL/uL — ABNORMAL LOW (ref 3.87–5.11)
RDW: 13.7 % (ref 11.5–15.5)
WBC: 6.8 10*3/uL (ref 4.0–10.5)
nRBC: 0 % (ref 0.0–0.2)

## 2019-12-12 LAB — BASIC METABOLIC PANEL
Anion gap: 11 (ref 5–15)
BUN: 29 mg/dL — ABNORMAL HIGH (ref 8–23)
CO2: 26 mmol/L (ref 22–32)
Calcium: 9.2 mg/dL (ref 8.9–10.3)
Chloride: 104 mmol/L (ref 98–111)
Creatinine, Ser: 1.04 mg/dL — ABNORMAL HIGH (ref 0.44–1.00)
GFR calc Af Amer: >60 mL/min (ref >60)
GFR calc non Af Amer: 53 mL/min — ABNORMAL LOW (ref >60)
Glucose, Bld: 151 mg/dL — ABNORMAL HIGH (ref 70–99)
Potassium: 3.8 mmol/L (ref 3.5–5.1)
Sodium: 141 mmol/L (ref 135–145)

## 2019-12-12 LAB — SARS CORONAVIRUS 2 (TAT 6-24 HRS): SARS Coronavirus 2: NEGATIVE

## 2019-12-12 LAB — GLUCOSE, CAPILLARY
Glucose-Capillary: 116 mg/dL — ABNORMAL HIGH (ref 70–99)
Glucose-Capillary: 122 mg/dL — ABNORMAL HIGH (ref 70–99)
Glucose-Capillary: 145 mg/dL — ABNORMAL HIGH (ref 70–99)
Glucose-Capillary: 161 mg/dL — ABNORMAL HIGH (ref 70–99)

## 2019-12-12 LAB — SURGICAL PCR SCREEN
MRSA, PCR: POSITIVE — AB
Staphylococcus aureus: POSITIVE — AB

## 2019-12-12 LAB — HEMOGLOBIN A1C
Hgb A1c MFr Bld: 6 % — ABNORMAL HIGH (ref 4.8–5.6)
Mean Plasma Glucose: 125.5 mg/dL

## 2019-12-12 MED ORDER — MELATONIN 3 MG PO TABS
3.0000 mg | ORAL_TABLET | Freq: Every day | ORAL | Status: DC
  Administered 2019-12-12 – 2019-12-17 (×7): 3 mg via ORAL
  Filled 2019-12-12 (×8): qty 1

## 2019-12-12 MED ORDER — FAMOTIDINE 20 MG PO TABS
20.0000 mg | ORAL_TABLET | Freq: Two times a day (BID) | ORAL | Status: DC
  Administered 2019-12-12 – 2019-12-18 (×14): 20 mg via ORAL
  Filled 2019-12-12 (×14): qty 1

## 2019-12-12 MED ORDER — FUROSEMIDE 20 MG PO TABS: 40.0000 mg | ORAL_TABLET | ORAL | Status: DC

## 2019-12-12 MED ORDER — LEVOTHYROXINE SODIUM 75 MCG PO TABS
75.0000 ug | ORAL_TABLET | Freq: Every day | ORAL | Status: DC
  Administered 2019-12-12 – 2019-12-18 (×6): 75 ug via ORAL
  Filled 2019-12-12 (×6): qty 1

## 2019-12-12 MED ORDER — ACETAMINOPHEN 325 MG PO TABS
650.0000 mg | ORAL_TABLET | Freq: Four times a day (QID) | ORAL | Status: DC | PRN
  Administered 2019-12-12 – 2019-12-18 (×10): 650 mg via ORAL
  Filled 2019-12-12 (×10): qty 2

## 2019-12-12 MED ORDER — MUPIROCIN 2 % EX OINT
1.0000 "application " | TOPICAL_OINTMENT | Freq: Two times a day (BID) | CUTANEOUS | Status: AC
  Administered 2019-12-13 – 2019-12-17 (×10): 1 via NASAL
  Filled 2019-12-12 (×3): qty 22

## 2019-12-12 MED ORDER — MORPHINE SULFATE (PF) 2 MG/ML IV SOLN
1.0000 mg | INTRAVENOUS | Status: DC | PRN
  Administered 2019-12-12: 1 mg via INTRAVENOUS
  Filled 2019-12-12: qty 1

## 2019-12-12 MED ORDER — SPIRONOLACTONE 12.5 MG HALF TABLET
12.5000 mg | ORAL_TABLET | Freq: Every day | ORAL | Status: DC
  Administered 2019-12-12 – 2019-12-17 (×7): 12.5 mg via ORAL
  Filled 2019-12-12 (×8): qty 1

## 2019-12-12 MED ORDER — METOPROLOL SUCCINATE ER 25 MG PO TB24
25.0000 mg | ORAL_TABLET | Freq: Every day | ORAL | Status: DC
  Administered 2019-12-12 – 2019-12-18 (×7): 25 mg via ORAL
  Filled 2019-12-12 (×7): qty 1

## 2019-12-12 MED ORDER — LOSARTAN POTASSIUM 25 MG PO TABS
12.5000 mg | ORAL_TABLET | Freq: Every day | ORAL | Status: DC
Start: 2019-12-12 — End: 2019-12-18
  Administered 2019-12-12 – 2019-12-18 (×7): 12.5 mg via ORAL
  Filled 2019-12-12 (×7): qty 1

## 2019-12-12 MED ORDER — INSULIN ASPART 100 UNIT/ML ~~LOC~~ SOLN
0.0000 [IU] | Freq: Three times a day (TID) | SUBCUTANEOUS | Status: DC
  Administered 2019-12-12 (×2): 1 [IU] via SUBCUTANEOUS
  Administered 2019-12-14 (×3): 2 [IU] via SUBCUTANEOUS
  Administered 2019-12-15 – 2019-12-18 (×8): 1 [IU] via SUBCUTANEOUS

## 2019-12-12 MED ORDER — OXYCODONE HCL 5 MG PO TABS
5.0000 mg | ORAL_TABLET | ORAL | Status: DC | PRN
  Administered 2019-12-12 – 2019-12-15 (×8): 5 mg via ORAL
  Filled 2019-12-12 (×8): qty 1

## 2019-12-12 MED ORDER — ONDANSETRON HCL 4 MG/2ML IJ SOLN
4.0000 mg | Freq: Four times a day (QID) | INTRAMUSCULAR | Status: DC | PRN
  Filled 2019-12-12: qty 2

## 2019-12-12 MED ORDER — POLYVINYL ALCOHOL 1.4 % OP SOLN
1.0000 [drp] | OPHTHALMIC | Status: DC | PRN
  Administered 2019-12-13 – 2019-12-17 (×3): 1 [drp] via OPHTHALMIC
  Filled 2019-12-12 (×3): qty 15

## 2019-12-12 MED ORDER — ATORVASTATIN CALCIUM 80 MG PO TABS
80.0000 mg | ORAL_TABLET | Freq: Every day | ORAL | Status: DC
  Administered 2019-12-12 – 2019-12-17 (×6): 80 mg via ORAL
  Filled 2019-12-12 (×6): qty 1

## 2019-12-12 MED ORDER — CHLORHEXIDINE GLUCONATE CLOTH 2 % EX PADS
6.0000 | MEDICATED_PAD | Freq: Every day | CUTANEOUS | Status: AC
  Administered 2019-12-13 – 2019-12-17 (×3): 6 via TOPICAL

## 2019-12-12 MED ORDER — GABAPENTIN 100 MG PO CAPS
100.0000 mg | ORAL_CAPSULE | Freq: Three times a day (TID) | ORAL | Status: DC | PRN
  Administered 2019-12-12: 17:00:00 100 mg via ORAL
  Filled 2019-12-12: qty 1

## 2019-12-12 MED ORDER — FUROSEMIDE 40 MG PO TABS
40.0000 mg | ORAL_TABLET | Freq: Every day | ORAL | Status: DC
  Administered 2019-12-12 – 2019-12-17 (×6): 40 mg via ORAL
  Filled 2019-12-12 (×6): qty 1

## 2019-12-12 MED ORDER — FUROSEMIDE 80 MG PO TABS
80.0000 mg | ORAL_TABLET | ORAL | Status: DC
  Administered 2019-12-12 – 2019-12-18 (×7): 80 mg via ORAL
  Filled 2019-12-12 (×7): qty 1

## 2019-12-12 MED ORDER — CLINDAMYCIN PHOSPHATE 900 MG/50ML IV SOLN
900.0000 mg | INTRAVENOUS | Status: AC
  Administered 2019-12-13: 900 mg via INTRAVENOUS
  Filled 2019-12-12 (×2): qty 50

## 2019-12-12 MED ORDER — SENNOSIDES-DOCUSATE SODIUM 8.6-50 MG PO TABS: 1.0000 | ORAL_TABLET | Freq: Every evening | ORAL | Status: DC | PRN

## 2019-12-12 MED ORDER — ONDANSETRON HCL 4 MG PO TABS: 4.0000 mg | ORAL_TABLET | Freq: Four times a day (QID) | ORAL | Status: DC | PRN

## 2019-12-12 MED ORDER — ACETAMINOPHEN 650 MG RE SUPP: 650.0000 mg | Freq: Four times a day (QID) | RECTAL | Status: DC | PRN

## 2019-12-12 NOTE — Progress Notes (Signed)
The chaplain visited as a result of a consult. The patient expressed to the chaplain that she has struggled since a string of deaths among her loved ones, and her health challenges that immediatly followed. The patient suggested strongly a belief in God and God's providence. She stated a belief that there is a purpose and that God will see her through this difficult time. The patient feels alone despite this belief and feels that after a lifetime of caring for other's there is no one left to care for her. The chaplain provided an empathetic ear and Megan [name] expressed feeling better as a result of being able to express herself. The chaplain will follow-up tomorrow after her scheduled surgery.  Brion Aliment Chaplain Resident For questions concerning this note please contact me by pager [phone]

## 2019-12-12 NOTE — TOC Initial Note (Signed)
Transition of Care [facility]) - Initial/Assessment Note    Patient Details  Name: Megan [name] MRN: [id number] Date of Birth: 12-10-1945  Transition of Care [facility]) CM/SW Contact:    Alexander Mt, LCSW Phone Number: 12/12/2019, 11:35 AM  Clinical Narrative:                 CSW spoke with pt at bedside. Introduced self, role, reason for visit. Pt from home, she recently had discharged from [facility]. She lives alone but had several private pay aides coming in to assist her for 8 hrs a day. Pt states that she would like to go back to [facility] when she is ready but ultimately wants to go home.   She and this Probation officer discussed that she has had multiple losses in her family over the past four years or so. 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 After he passed she has had a cascade of health challenges which have had her in and out of the hospital. She feels understandably overwhelmed but has completed her HCPOA and received both doses of Moderna vaccine. She is wanting to return home again as able.   Pt aware CSW will follow for recovery after surgery and initiate insurance authorization when she is able. CSW also provided verbal and emotional support for pt's complicated grief. CSW to also page chaplains to come by and see pt.   Expected Discharge Plan: Skilled Nursing Facility Barriers to Discharge: Continued Medical Work up, Ship broker   Patient Goals and CMS Choice Patient states their goals for this hospitalization and ongoing recovery are:: return to [facility] but ultimately to get back home CMS Medicare.gov Compare Post Acute Care list provided to:: Patient Choice offered to / list presented to : Patient  Expected Discharge Plan and Services Expected Discharge Plan: [place] In-house Referral: Clinical Social Work Discharge Planning Services: CM Consult Post Acute Care Choice:  [place], Durable Medical Equipment Living arrangements for the past 2 months: [place]    Prior Living Arrangements/Services Living arrangements for the past 2 months: Single Family Home Lives with:: Self Patient language and need for interpreter reviewed:: Yes Do you feel safe going back to the place where you live?: Yes      Need for Family Participation in Patient Care: Yes (Comment) Care giver support system in place?: Yes (comment) Current home services: DME, Homehealth aide Criminal Activity/Legal Involvement Pertinent to Current Situation/Hospitalization: No - Comment as needed  Activities of Daily Living Home Assistive Devices/Equipment: Environmental consultant (specify type), Wheelchair ADL Screening (condition at time of admission) Patient's cognitive ability adequate to safely complete daily activities?: Yes Is the patient deaf or have difficulty hearing?: No Does the patient have difficulty seeing, even when wearing glasses/contacts?: No Does the patient have difficulty concentrating, remembering, or making decisions?: No Patient able to express need for assistance with ADLs?: Yes Does the patient have difficulty dressing or bathing?: No Independently performs ADLs?: Yes (appropriate for developmental age) Does the patient have difficulty walking or climbing stairs?: Yes Weakness of Legs: Both Weakness of Arms/Hands: Both  Permission Sought/Granted Permission sought to share information with : Family Supports, Chartered certified accountant granted to share information with : Yes, Verbal Permission Granted  Share Information with NAME: Charise Killian  Permission granted to share info w AGENCY: [place] granted to share info w Relationship: sister  Permission granted to share info w Contact Information: [phone]  Emotional Assessment   Attitude/Demeanor/Rapport: Engaged, Gracious Affect (typically observed):  Accepting, Adaptable, Appropriate, Overwhelmed Orientation: : Oriented to Self, Oriented to Place, Oriented to  Time, Oriented to Situation Alcohol / Substance Use: Not Applicable Psych Involvement: (n/a)  Admission diagnosis:  Closed fracture of left radius and ulna, initial encounter [S52.92XA, S52.202A] Patient Active Problem List   Diagnosis Date Noted  . Closed fracture of left radius and ulna 12/12/2019  . Hyperlipidemia associated with type 2 diabetes mellitus ([place]) 12/12/2019  . Hypertension associated with diabetes ([place]) 12/12/2019  . CAD (coronary artery disease) 07/03/2019  . Iron deficiency anemia 06/30/2019  . 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  . NSTEMI (non-ST elevated myocardial infarction) ([place]) 05/13/2019  . Nausea 04/02/2019  . Dry eyes 04/02/2019  . Insomnia 03/13/2019  . Hypotension 03/05/2019  . Muscle spasm 02/28/2019  . Osteoarthritis of left knee 02/28/2019  . Gout 02/16/2019  . Hypothyroidism 02/16/2019  . GERD (gastroesophageal reflux disease) 02/16/2019  . Anxiety 02/16/2019  . Hypokalemia 02/16/2019  . Hyperuricemia 02/04/2019  . Dysphagia 01/16/2019  . Closed right ankle fracture 01/16/2019  . Generalized weakness 01/16/2019  . CKD (chronic kidney disease) stage 3, GFR 30-59 ml/min 11/26/2018  . Cellulitis of lower extremity   . Chronic diastolic CHF (congestive heart failure) ([place]) 09/08/2018  . DM (diabetes mellitus), type 2 with complications ([place]) [id number]  . Hypertensive heart disease with CHF (congestive heart failure) ([place]) 09/08/2018  . Leg edema 09/08/2018  . Blood in stool 09/08/2018  . OSA (obstructive sleep apnea) 09/08/2018  . Sensorineural hearing loss (SNHL) of both ears 07/31/2017  . Lumbar radiculopathy 12/05/2016  . Diabetic polyneuropathy associated with diabetes mellitus due to underlying condition ([place]) 07/09/2015  . Vitamin deficiency related neuropathy 07/09/2015  . Hx of adenomatous colonic polyps 12/02/2010   PCP:  [name], MD Pharmacy:   [id number] - [place], [place] [address] Phone: [phone] Fax: [phone]   Readmission Risk Interventions No flowsheet data found.

## 2019-12-12 NOTE — Plan of Care (Signed)

## 2019-12-12 NOTE — Evaluation (Signed)
Physical Therapy Evaluation Patient Details Name: Megan [name] MRN: [id number] DOB: 08-25-1946 Today's Date: 12/12/2019   History of Present Illness  74 y.o. female with medical history significant for CAD s/p CABG September [id number], chronic diastolic CHF, hypertension, type 2 diabetes, CKD stage III, hypothyroidism, hyperlipidemia, anxiety, obesity, and OSA who presents to the ED for evaluation of left arm pain after a fall. Pt found to have acute fx of L radius and ulna, requiring non-emergent surgery.  Clinical Impression  Pt presents to PT with deficits in functional mobility, gait, balance, endurance, power, strength, ROM (R shoulder), safety awareness. Pt slightly limited by pain in L forearm during session, but remains able to tolerate bed mobility and transfers OOB at this time. Pt currently requires physical assistance to mobilize due to dependence on BUE support at baseline. Pt will benefit from continued acute PT POC to reduce falls risk and improve mobility quality.    Follow Up Recommendations SNF;Supervision/Assistance - 24 hour    Equipment Recommendations  3in1 (PT)    Recommendations for Other Services       Precautions / Restrictions Precautions Precautions: Fall Restrictions Weight Bearing Restrictions: Yes LUE Weight Bearing: Non weight bearing      Mobility  Bed Mobility Overal bed mobility: Needs Assistance Bed Mobility: Supine to Sit     Supine to sit: Mod assist;HOB elevated        Transfers Overall transfer level: Needs assistance Equipment used: 1 person hand held assist Transfers: Sit to/from [facility] Sit to Stand: Mod assist;From elevated surface Stand pivot transfers: Min assist       General transfer comment: minA to step and turn once standing  Ambulation/Gait                Stairs            Wheelchair Mobility    Modified Rankin (Stroke Patients Only)       Balance Overall balance  assessment: Needs assistance Sitting-balance support: Single extremity supported;Feet supported Sitting balance-Leahy Scale: Fair Sitting balance - Comments: close supervision   Standing balance support: Single extremity supported Standing balance-Leahy Scale: Poor Standing balance comment: minA with RUE support                             Pertinent Vitals/Pain Pain Assessment: Faces Faces Pain Scale: Hurts even more Pain Location: LUE Pain Descriptors / Indicators: Grimacing Pain Intervention(s): Limited activity within patient's tolerance    Home Living Family/patient expects to be discharged to:: Private residence Living Arrangements: Alone Available Help at Discharge: Family;Personal care attendant;Available PRN/intermittently(PCA 8-12, 4-8 daily) Type of Home: House Home Access: Stairs to enter Entrance Stairs-Rails: None Entrance Stairs-Number of Steps: 1 (small threshold) Home Layout: One level Home Equipment: Shower seat - built in;Grab bars - tub/shower;Hand held shower head;Cane - single point;Walker - 2 wheels;Walker - 4 wheels;Wheelchair - [employer];Tub bench;Adaptive equipment Additional Comments: lift chair    Prior Function Level of Independence: Needs assistance   Gait / Transfers Assistance Needed: ambulates household distances with use of RW, dependent on BUE support per pt. Pt reports also wheeling around the house in a wheelchair on days she feels weaker           Hand Dominance   Dominant Hand: Right    Extremity/Trunk Assessment   Upper Extremity Assessment Upper Extremity Assessment: Generalized weakness    Lower Extremity Assessment Lower Extremity Assessment: Generalized weakness(RLE swelling, chronic  2/2 cellulitis)    Cervical / Trunk Assessment Cervical / Trunk Assessment: Normal  Communication   Communication: No difficulties  Cognition Arousal/Alertness: Awake/alert Behavior During Therapy: WFL for tasks  assessed/performed Overall Cognitive Status: Within Functional Limits for tasks assessed                                        General Comments General comments (skin integrity, edema, etc.): VSS on RA    Exercises     Assessment/Plan    PT Assessment Patient needs continued PT services  PT Problem List Decreased strength;Decreased activity tolerance;Decreased balance;Decreased mobility;Decreased range of motion;Decreased knowledge of use of DME;Decreased knowledge of precautions;Pain       PT Treatment Interventions DME instruction;Gait training;Stair training;Functional mobility training;Therapeutic exercise;Therapeutic activities;Balance training;Neuromuscular re-education;Patient/family education;Wheelchair mobility training    PT Goals (Current goals can be found in the Care Plan section)  Acute Rehab PT Goals Patient Stated Goal: To go to rehab and get stronger PT Goal Formulation: With patient Time For Goal Achievement: 12/26/19 Potential to Achieve Goals: Good    Frequency Min 3X/week   Barriers to discharge        Co-evaluation               AM-PAC PT "6 Clicks" Mobility  Outcome Measure Help needed turning from your back to your side while in a flat bed without using bedrails?: A Lot Help needed moving from lying on your back to sitting on the side of a flat bed without using bedrails?: A Lot Help needed moving to and from a bed to a chair (including a wheelchair)?: A Little Help needed standing up from a chair using your arms (e.g., wheelchair or bedside chair)?: A Lot Help needed to walk in hospital room?: Total Help needed climbing 3-5 steps with a railing? : Total 6 Click Score: 11    End of Session   Activity Tolerance: Patient tolerated treatment well Patient left: in chair;with call bell/phone within reach;with chair alarm set;with nursing/sitter in room Nurse Communication: Mobility status PT Visit Diagnosis: Unsteadiness on  feet (R26.81);Muscle weakness (generalized) (M62.81);History of falling (Z91.81);Pain Pain - Right/Left: Left Pain - part of body: Arm    Time: [id number] PT Time Calculation (min) (ACUTE ONLY): 31 min   Charges:   PT Evaluation $PT Eval Moderate Complexity: 1 Mod          Zenaida Niece, PT, DPT Acute Rehabilitation Pager: [phone]   Zenaida Niece 12/12/2019, 9:52 AM

## 2019-12-12 NOTE — Progress Notes (Signed)
Patient seen and examined.  Admitted early morning hours by nighttime hospitalist.  Extensive medical issues especially since last 1 year including multiple fractures, CABG and multiple skilled rehab placements.  Traumatic fracture of the left forearm bones. Plan: Unsafe to go home.  Will need a skilled rehab on discharge. Surgery planned for tomorrow.  Patient has significant issues including multiple medical issues and mobility issues now with fracture of the left forearm that makes it difficult to ambulate around with a walker, she cannot go home as it is not safe for her to go home.  She needs to stay in the hospital until her medical issues taken care of and will need transition to rehab.

## 2019-12-12 NOTE — H&P (Signed)
History and Physical    Megan [name] [id number] DOB: 12/23/1945 DOA: 12/11/2019  PCP: [name], MD  Patient coming from: Home via EMS  I have personally briefly reviewed patient's old medical records in [place]  Chief Complaint: Left arm pain after a fall  HPI: Megan [name] is a 74 y.o. female with medical history significant for CAD s/p CABG September [id number], chronic diastolic CHF, hypertension, type 2 diabetes, CKD stage III, hypothyroidism, hyperlipidemia, anxiety, obesity, and OSA who presents to the ED for evaluation of left arm pain after a fall.  Patient states she normally ambulates with the use of walker.  While at home, she bent over to check if her lift chair was plugged in and she lost her balance and fell forward hitting her head and left arm on her windowsill.  She denies any loss of consciousness.  She was having significant pain in her left arm and was unable to pull herself up to stand up.  She denied any associated chest pain, palpitations, dyspnea, lightheadedness/dizziness, nausea, vomiting, abdominal pain, diarrhea, dysuria.  She states she has been significantly deconditioned since hospitalization in September 2020 after undergoing CABG and required a stay at skilled nursing facility on discharge at that time.  She has been on Plavix and states she last took it morning of 12/11/2019.  ED Course:  Initial vitals showed BP 104/67, pulse 81, RR 16, temp 97.2 Fahrenheit, SPO2 95% on room air.  Labs are notable for WBC 8.1, hemoglobin 12.2, platelets 260,000, sodium 141, potassium 4.3, bicarb 25, BUN 28, creatinine 0.99.  SARS-CoV-2 PCR is collected and pending.  Left forearm x-ray showed an acute transverse and mildly comminuted midshaft fracture of the left radius and ulna.  Left knee x-ray was negative for acute fracture or dislocation.  CT head without contrast was negative for acute intracranial findings.  Mild parenchymal volume loss and  chronic microvascular angiopathy noted.  CT cervical spine without contrast is negative for acute fracture or traumatic listhesis of the cervical spine.  Patient was given IV morphine.  EDP discussed the case with on-call hand surgery who recommended nonemergent surgical intervention with plan for OR this Friday if remains in hospital.  EDP felt patient was unsafe to return home and care for self and therefore the hospitalist service was consulted to admit for further management.  Review of Systems: All systems reviewed and are negative except as documented in history of present illness above.   Past Medical History:  Diagnosis Date  . Allergy    bee stings  . Anxiety   . Asthmatic bronchitis   . Broken arm 1957/2008   right  . Cataract    had surgery   . Depression   . Diabetes mellitus without complication ([place])    prediabetes diet controlled  . Diverticulosis   . GERD (gastroesophageal reflux disease)   . Glaucoma    BIL  . Hemorrhoids   . Hx of adenomatous colonic polyps 12/02/2010  . Hyperlipidemia   . Hypertension   . Hypothyroidism 05/2018  . Inflammatory arthritis   . Knee bursitis   . Migraines   . Obesity   . Osteoarthritis   . Sleep apnea     Past Surgical History:  Procedure Laterality Date  . CARPAL TUNNEL RELEASE Left    left wrist  . CATARACT EXTRACTION W/ INTRAOCULAR LENS  IMPLANT, BILATERAL Bilateral    1999 left, 2008 right  . COLONOSCOPY    . CORONARY ARTERY BYPASS  GRAFT N/A 05/15/2019   Procedure: CORONARY ARTERY BYPASS GRAFTING (CABG) times two, left internal mammary artery to left anterior descending artery and left greater saphenous vein to posterior descending artery, left sapheouns vein harvested endoscopically ;  Surgeon: Grace Isaac, MD;  Location: [facility];  Service: Open Heart Surgery;  Laterality: N/A;  . EXCISION MORTON'S NEUROMA Left 1978   left foot  . GLAUCOMA SURGERY Bilateral    and laser surgery, 2004 left, 2008 right  . LEFT  HEART CATH AND CORONARY ANGIOGRAPHY N/A 05/13/2019   Procedure: LEFT HEART CATH AND CORONARY ANGIOGRAPHY;  Surgeon: Martinique, Peter M, MD;  Location: [facility];  Service: Cardiovascular;  Laterality: N/A;  . PARTIAL HYSTERECTOMY  1991   Left an ovary  . POLYPECTOMY    . TEE WITHOUT CARDIOVERSION N/A 05/15/2019   Procedure: TRANSESOPHAGEAL ECHOCARDIOGRAM (TEE);  Surgeon: Grace Isaac, MD;  Location: [facility];  Service: Open Heart Surgery;  Laterality: N/A;  . TOENAIL AVULSION Right    big toe    Social History:  reports that she quit smoking about 48 years ago. Her smoking use included cigarettes. She has never used smokeless tobacco. She reports that she does not drink alcohol or use drugs.  Allergies  Allergen Reactions  . Aspirin Anaphylaxis  . Bee Pollen Swelling and Other (See Comments)    Extreme swelling due to bee stings  . Codeine Nausea And Vomiting  . Fish Oil Diarrhea  . Sulfa Antibiotics Nausea And Vomiting  . Hornet Venom Swelling and Other (See Comments)    Foot became swollen 3X it's normal size   . Iron Other (See Comments)    Severe Headache  . Pantoprazole Diarrhea  . Tape Other (See Comments)    "Tape leaves burn-like places and blisters"  . Camphor Dermatitis  . Lisinopril Cough  . Penicillins Rash and Other (See Comments)    Swelling was of the    . Sulfasalazine Nausea And Vomiting  . Vicodin [Hydrocodone-Acetaminophen] Nausea And Vomiting    Family History  Problem Relation Age of Onset  . Colon polyps Maternal Grandfather   . Bone cancer Maternal Grandfather   . Hypertension Mother   . Diabetes Mother   . Parkinson's disease Mother   . Hypertension Father   . Diabetes Father   . Heart Problems Father   . Asthma Brother   . Diabetes Brother   . Breast cancer Brother        stage 4, both breast  . Obesity Other   . Diabetes Sister        x 2  . COPD Sister   . Asthma Sister   . Clotting disorder Sister        blood count  .  Rectal cancer Neg Hx   . Stomach cancer Neg Hx   . Esophageal cancer Neg Hx      Prior to Admission medications   Medication Sig Start Date End Date Taking? Authorizing Provider  acetaminophen (TYLENOL) 650 MG CR tablet Take 650 mg by mouth every 8 (eight) hours.    Yes [provider]  allopurinol (ZYLOPRIM) 100 MG tablet Take 1 tablet (100 mg total) by mouth daily. 04/02/19  Yes Medina-Vargas, Monina C, NP  atorvastatin (LIPITOR) 80 MG tablet Take 1 tablet (80 mg total) by mouth daily at 6 PM. 05/28/19  Yes Roddenberry, Myron G, PA-C  calcium carbonate (TUMS - DOSED IN MG ELEMENTAL CALCIUM) 500 MG chewable tablet Chew 1-2 tablets by  mouth as needed for indigestion or heartburn.   Yes [provider]  clopidogrel (PLAVIX) 75 MG tablet Take 1 tablet (75 mg total) by mouth daily. 05/29/19  Yes Roddenberry, Arlis Porta, PA-C  diclofenac sodium (VOLTAREN) 1 % GEL Apply 2 gm topically to left knee Patient taking differently: Apply 2 g topically 2 (two) times daily as needed (for left knee pain).  04/02/19  Yes Medina-Vargas, Monina C, NP  EPINEPHrine 0.3 mg/0.3 mL IJ SOAJ injection Inject 0.3 mg into the muscle once as needed for anaphylaxis.    Yes [provider]  famotidine (PEPCID) 20 MG tablet Take 20 mg by mouth 2 (two) times daily. 11/20/19  Yes [provider]  furosemide (LASIX) 40 MG tablet Take 1 tablet (40 mg total) by mouth 2 (two) times daily. Patient taking differently: Take 40-80 mg by mouth See admin instructions. Take 80 mg by mouth in the morning and 40 mg with supper 07/03/19 12/30/19 Yes Nahser, Wonda Cheng, MD  gabapentin (NEURONTIN) 100 MG capsule Take 100 mg by mouth every 8 (eight) hours as needed (for pain).   Yes [provider]  levothyroxine (SYNTHROID) 75 MCG tablet Take 75 mcg by mouth daily before breakfast.   Yes [provider]  Lidocaine (ASPERCREME LIDOCAINE) 4 % PTCH Place 1 patch onto the skin daily. APPLY TOPICALLY TO LOWER  BACK ONCE DAILY FOR PAIN Patient taking differently: Place 1 patch onto the skin See admin instructions. Apply 1 patch to the lower back once a day 04/02/19  Yes Medina-Vargas, Monina C, NP  LORazepam (ATIVAN) 0.5 MG tablet Take 0.5 mg by mouth in the morning and at bedtime.    Yes [provider]  losartan (COZAAR) 25 MG tablet Take 0.5 tablets (12.5 mg total) by mouth daily. 06/06/19  Yes Daune Perch, NP  Magnesium Oxide 400 MG CAPS Take 1 capsule (400 mg total) by mouth daily. Patient taking differently: Take 400 mg by mouth in the morning and at bedtime.  09/06/19  Yes Daune Perch, NP  meclizine (ANTIVERT) 25 MG tablet Take 25 mg by mouth 3 (three) times daily as needed for dizziness.  10/28/19  Yes [provider]  Melatonin 3 MG TABS Take 1 tablet (3 mg total) by mouth at bedtime. FOR INSOMNIA Patient taking differently: Take 3 mg by mouth at bedtime.  04/02/19  Yes Medina-Vargas, Monina C, NP  metFORMIN (GLUCOPHAGE) 500 MG tablet Take 1 tablet (500 mg total) by mouth 2 (two) times daily with a meal. 04/02/19  Yes Medina-Vargas, Monina C, NP  metoprolol succinate (TOPROL XL) 25 MG 24 hr tablet Take 1 tablet (25 mg total) by mouth daily. 06/06/19  Yes Daune Perch, NP  Multiple Vitamins-Iron (MULTIVITAMINS WITH IRON) TABS tablet Take 1 tablet by mouth daily. 05/28/19  Yes Roddenberry, Myron G, PA-C  ondansetron (ZOFRAN) 4 MG tablet Take 1 tablet (4 mg total) by mouth every 6 (six) hours as needed for nausea or vomiting. GIVE 1 TABLET BY MOUTH EVERY 6 HOURS AS NEEDED FOR NAUSEA/VOMITING Patient taking differently: Take 4 mg by mouth every 6 (six) hours as needed for nausea or vomiting.  04/02/19  Yes Medina-Vargas, Monina C, NP  polyvinyl alcohol (LIQUIFILM TEARS) 1.4 % ophthalmic solution Place 1 drop into both eyes every 4 (four) hours as needed for dry eyes. 04/02/19  Yes Medina-Vargas, Monina C, NP  Pyridoxine HCl (VITAMIN B6 PO) Take 1 tablet by mouth daily.   Yes [provider]  spironolactone (ALDACTONE) 25  MG tablet Take 12.5 mg by mouth at bedtime.    Yes [provider]  vitamin B-12 (CYANOCOBALAMIN) 1000 MCG tablet 1 tab by orally daily Patient taking differently: Take 1,000 mcg by mouth daily.  04/02/19  Yes Medina-Vargas, Monina C, NP  Vitamin D, Ergocalciferol, (DRISDOL) 1.25 MG (50000 UNIT) CAPS capsule Take 50,000 Units by mouth every Thursday.   Yes [provider]  ONETOUCH VERIO test strip USE TO TEST BLOOD GLUCOSE ONCE DAILY 04/02/19   Medina-Vargas, Monina C, NP  pantoprazole (PROTONIX) 40 MG tablet Take 1 tablet (40 mg total) by mouth daily. Patient not taking: Reported on 12/11/2019 04/02/19   Durenda Age C, NP    Physical Exam: Vitals:   12/11/19 2315 12/12/19 0042 12/12/19 0100 12/12/19 0115  BP:   (!) 113/49   Pulse: 77 76  75  Resp:      Temp:      TempSrc:      SpO2: 100% 100%  100%   Constitutional: Elderly woman resting supine in bed, NAD, calm, comfortable Eyes: PERRL, lids and conjunctivae normal ENMT: Mucous membranes are moist. Posterior pharynx clear of any exudate or lesions.Normal dentition.  Neck: normal, supple, no masses. Respiratory: clear to auscultation bilaterally, no wheezing, no crackles. Normal respiratory effort. No accessory muscle use.  Cardiovascular: Regular rate and rhythm, no murmurs / rubs / gallops. No extremity edema. 2+ pedal pulses. Abdomen: no tenderness, no masses palpated. No hepatosplenomegaly. Bowel sounds positive.  Musculoskeletal: Sugar tong splint in place left upper extremity.  No clubbing / cyanosis. Normal muscle tone.  Skin: no rashes, lesions, ulcers. No induration Neurologic: CN 2-12 grossly intact. Sensation intact, moving all extremities equally Psychiatric: Normal judgment and insight. Alert and oriented x 3. Normal mood.   Labs on Admission: I have personally reviewed following labs and imaging studies  CBC: Recent Labs  Lab 12/11/19 2117  WBC  8.1  NEUTROABS 6.2  HGB 12.2  HCT 40.4  MCV 98.3  PLT 123456   Basic Metabolic Panel: Recent Labs  Lab 12/11/19 2117  NA 141  K 4.3  CL 102  CO2 25  GLUCOSE 139*  BUN 28*  CREATININE 0.99  CALCIUM 9.8   GFR: CrCl cannot be calculated (Unknown ideal weight.). Liver Function Tests: No results for input(s): AST, ALT, ALKPHOS, BILITOT, PROT, ALBUMIN in the last 168 hours. No results for input(s): LIPASE, AMYLASE in the last 168 hours. No results for input(s): AMMONIA in the last 168 hours. Coagulation Profile: No results for input(s): INR, PROTIME in the last 168 hours. Cardiac Enzymes: No results for input(s): CKTOTAL, CKMB, CKMBINDEX, TROPONINI in the last 168 hours. BNP (last 3 results) No results for input(s): PROBNP in the last 8760 hours. HbA1C: No results for input(s): HGBA1C in the last 72 hours. CBG: No results for input(s): GLUCAP in the last 168 hours. Lipid Profile: No results for input(s): CHOL, HDL, LDLCALC, TRIG, CHOLHDL, LDLDIRECT in the last 72 hours. Thyroid Function Tests: No results for input(s): TSH, T4TOTAL, FREET4, T3FREE, THYROIDAB in the last 72 hours. Anemia Panel: No results for input(s): VITAMINB12, FOLATE, FERRITIN, TIBC, IRON, RETICCTPCT in the last 72 hours. Urine analysis:    Component Value Date/Time   COLORURINE YELLOW 12/12/2019 0045   APPEARANCEUR CLEAR 12/12/2019 0045   LABSPEC 1.016 12/12/2019 0045   PHURINE 5.0 12/12/2019 0045   GLUCOSEU NEGATIVE 12/12/2019 0045   HGBUR NEGATIVE 12/12/2019 0045   BILIRUBINUR NEGATIVE 12/12/2019 0045   KETONESUR NEGATIVE 12/12/2019 0045   PROTEINUR  NEGATIVE 12/12/2019 0045   UROBILINOGEN Normal 01/19/2019 0000   NITRITE NEGATIVE 12/12/2019 0045   LEUKOCYTESUR TRACE (A) 12/12/2019 0045    Radiological Exams on Admission: DG Forearm Left  Result Date: 12/11/2019 CLINICAL DATA:  74 year old female status post fall with pain. EXAM: LEFT FOREARM - 2 VIEW COMPARISON:  None. FINDINGS: Transverse  but comminuted both-bone forearm fracture. Midshaft fractures of the left radius and ulna. 1/2 shaft with posterior displacement of the ulna, with similar anterior displacement but posterior angulation of the radius fracture. Alignment appears preserved at the left elbow and wrist. No other acute osseous abnormality identified. IMPRESSION: Acute transverse and mildly comminuted midshaft fractures of the left radius and ulna. Electronically Signed   By: Genevie Ann M.D.   On: 12/11/2019 19:06   CT Head Wo Contrast  Result Date: 12/11/2019 CLINICAL DATA:  Fill over chair, with positive head strike EXAM: CT HEAD WITHOUT CONTRAST TECHNIQUE: Contiguous axial images were obtained from the base of the skull through the vertex without intravenous contrast. COMPARISON:  CT 09/20/2013 FINDINGS: Brain: No evidence of acute infarction, hemorrhage, hydrocephalus, extra-axial collection or mass lesion/mass effect. Symmetric prominence of the ventricles, cisterns and sulci compatible with parenchymal volume loss. Patchy areas of white matter hypoattenuation are most compatible with chronic microvascular angiopathy. Vascular: Atherosclerotic calcification of the carotid siphons. No hyperdense vessel. Skull: No significant scalp swelling, hematoma or calvarial fracture. Moderate hyperostosis frontalis interna, a benign incidental finding. No worrisome or suspicious osseous lesions. Sinuses/Orbits: Paranasal sinuses and mastoid air cells are predominantly clear. Pneumatization of the petrous apices noted incidentally. Orbital structures are unremarkable aside from prior lens extractions. Other: None IMPRESSION: 1. No acute intracranial findings. No significant scalp swelling, hematoma, or calvarial fracture. 2. Mild parenchymal volume loss and chronic microvascular angiopathy. Electronically Signed   By: Lovena Le M.D.   On: 12/11/2019 20:43   CT Cervical Spine Wo Contrast  Result Date: 12/11/2019 CLINICAL DATA:  [place] over  chair with positive head strike EXAM: CT CERVICAL SPINE WITHOUT CONTRAST TECHNIQUE: Multidetector CT imaging of the cervical spine was performed without intravenous contrast. Multiplanar CT image reconstructions were also generated. COMPARISON:  Cervical spine radiographs Jan 03, 2006 FINDINGS: Alignment: Preservation of the normal cervical lordosis without traumatic listhesis. No abnormally widened, perched or jumped facets. Normal alignment of the craniocervical and atlantoaxial articulations. Skull base and vertebrae: No acute fracture. No primary bone lesion or focal pathologic process. Incomplete fusion of the spinous process of C6. Soft tissues and spinal canal: No pre or paravertebral fluid or swelling. No visible canal hematoma. Disc levels: Minimal spondylitic changes. No significant central canal or foraminal stenosis identified within the imaged levels of the spine. Mild-to-moderate atlantodental arthrosis. Upper chest: No acute abnormality in the upper chest or imaged lung apices. Other: Normal thyroid. Cervical carotid atherosclerosis seen bilaterally at the bifurcations. IMPRESSION: 1. No acute fracture or traumatic listhesis of the cervical spine. 2. Minimal spondylitic changes. 3. Cervical carotid atherosclerosis. Electronically Signed   By: Lovena Le M.D.   On: 12/11/2019 20:46   DG Knee Complete 4 Views Left  Result Date: 12/11/2019 CLINICAL DATA:  74 year old female status post fall with pain. EXAM: LEFT KNEE - COMPLETE 4+ VIEW COMPARISON:  01/08/2019. FINDINGS: No joint effusion on the cross-table lateral view. New surgical clips in the medial popliteal region since last year. Chronic tricompartmental degenerative spurring. Osteopenia. No acute osseous abnormality identified. No discrete soft tissue injury. IMPRESSION: No acute fracture or dislocation identified about the left knee. Electronically  Signed   By: Genevie Ann M.D.   On: 12/11/2019 19:04    EKG:  Pending.  Assessment/Plan Principal Problem:   Closed fracture of left radius and ulna Active Problems:   Chronic diastolic CHF (congestive heart failure) (HCC)   DM (diabetes mellitus), type 2 with complications ([facility])   CKD (chronic kidney disease) stage 3, GFR 30-59 ml/min   Hypothyroidism   S/P CABG x 2   CAD (coronary artery disease)   Hyperlipidemia associated with type 2 diabetes mellitus ([place])   Hypertension associated with diabetes ([place])  Megan [name] is a 75 y.o. female with medical history significant for CAD s/p CABG September [id number], chronic diastolic CHF, hypertension, type 2 diabetes, CKD stage III, hypothyroidism, hyperlipidemia, anxiety, obesity, and OSA who is admitted with acute fractures of the left radius and ulna.  Acute fracture of left radius and ulna: Occurring after a mechanical fall.  She will require surgical intervention on a nonemergent basis.  Hand surgery are aware and plan for possible fixation on 12/13/2019.  Patient considered unsafe to return home to care for self and at increased fall risk.  She has a 10.1% preoperative 30-day risk of death, MI, or cardiac arrest given her history of ischemic heart disease and CHF. -Continue pain control as needed with hold parameters -Hold Plavix -Make n.p.o. at midnight -Sugartong splint placed -Hand surgery to follow  CAD s/p CABG: Denies any recent chest pain.  Plavix on hold as above.  Continue Toprol-XL and atorvastatin.  Chronic diastolic CHF: Appears euvolemic and compensated.  Continue Lasix 80 mg a.m. and 40 mg p.m., Toprol-XL.  Monitor strict I/O's and daily weights.  CKD stage III: Chronic and stable.  Continue monitor.  Type 2 diabetes: Hold Metformin and place on sensitive SSI while in hospital.  Adjust as needed.  Hypertension: Resume home Toprol-XL, losartan, spironolactone, Lasix.  Hyperlipidemia: Continue atorvastatin.  Hypothyroidism: Continue  Synthroid.  Deconditioning/generalized weakness/falls: -Request PT/OT eval  DVT prophylaxis: SCDs Code Status: Full code, confirmed with patient Family Communication: Discussed with patient, she has discussed with family Consults called: EDP discussed with on-call hand surgery Admission status: Observation  Status is: Observation Dispo: The patient is from: Home              Anticipated d/c is to: SNF              Anticipated d/c date is: 3 days              Patient currently is not medically stable to d/c.   Zada Finders MD [facility]  If 7PM-7AM, please contact night-coverage [email or web address]  12/12/2019, 1:38 AM

## 2019-12-12 NOTE — Consult Note (Signed)
WILL PROCEED WITH ORIF BOTH BONE FOREARM FRACTURE TOMORROW ORDERS PLACED FOR SURGERY TOMORROW

## 2019-12-12 NOTE — NC FL2 (Addendum)
[place] LEVEL OF CARE SCREENING TOOL     IDENTIFICATION  Patient Name: Megan [name] Birthdate: 1946/05/07 Sex: female Admission Date (Current Location): 12/11/2019  [facility] and [place] Number:  Herbalist and Address:  [facility], [place] [address] [place], [place] [address]      Provider Number: [id number]  Attending Physician Name and Address:  Barb Merino, MD  Relative Name and Phone Number:       Current Level of Care: Hospital Recommended Level of Care: [place] Prior Approval Number:    Date Approved/Denied:   PASRR Number:   [id number]  Discharge Plan: SNF    Current Diagnoses: Patient Active Problem List   Diagnosis Date Noted  . Closed fracture of left radius and ulna 12/12/2019  . Hyperlipidemia associated with type 2 diabetes mellitus ([place]) 12/12/2019  . Hypertension associated with diabetes ([place]) 12/12/2019  . Fracture closed of upper end of forearm 12/12/2019  . CAD (coronary artery disease) 07/03/2019  . Iron deficiency anemia 06/30/2019  . 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  . NSTEMI (non-ST elevated myocardial infarction) ([place]) 05/13/2019  . Nausea 04/02/2019  . Dry eyes 04/02/2019  . Insomnia 03/13/2019  . Hypotension 03/05/2019  . Muscle spasm 02/28/2019  . Osteoarthritis of left knee 02/28/2019  . Gout 02/16/2019  . Hypothyroidism 02/16/2019  . GERD (gastroesophageal reflux disease) 02/16/2019  . Anxiety 02/16/2019  . Hypokalemia 02/16/2019  . Hyperuricemia 02/04/2019  . Dysphagia 01/16/2019  . Closed right ankle fracture 01/16/2019  . Generalized weakness 01/16/2019  . CKD (chronic kidney disease) stage 3, GFR 30-59 ml/min 11/26/2018  . Cellulitis of lower extremity   . Chronic diastolic CHF (congestive heart failure) ([place]) 09/08/2018  . DM (diabetes mellitus), type 2 with complications ([place]) [id number]  . Hypertensive heart disease with CHF (congestive heart failure) ([place])  09/08/2018  . Leg edema 09/08/2018  . Blood in stool 09/08/2018  . OSA (obstructive sleep apnea) 09/08/2018  . Sensorineural hearing loss (SNHL) of both ears 07/31/2017  . Lumbar radiculopathy 12/05/2016  . Diabetic polyneuropathy associated with diabetes mellitus due to underlying condition ([place]) 07/09/2015  . Vitamin deficiency related neuropathy 07/09/2015  . Hx of adenomatous colonic polyps 12/02/2010    Orientation RESPIRATION BLADDER Height & Weight     Self, Time, Situation, Place  Normal External catheter, Incontinent Weight:   Height:     BEHAVIORAL SYMPTOMS/MOOD NEUROLOGICAL BOWEL NUTRITION STATUS      Continent Diet(see discharge summary)  AMBULATORY STATUS COMMUNICATION OF NEEDS Skin   Extensive Assist Verbally Skin abrasions, Surgical wounds(abrasion on right knee; closed incision)                       Personal Care Assistance Level of Assistance  Dressing, Feeding, Bathing Bathing Assistance: Maximum assistance Feeding assistance: Limited assistance Dressing Assistance: Maximum assistance     Functional Limitations Info  Sight, Hearing, Speech Sight Info: Adequate(wears glasses) Hearing Info: Adequate Speech Info: Adequate    SPECIAL CARE FACTORS FREQUENCY  PT (By licensed PT), OT (By licensed OT)     PT Frequency: 5x week OT Frequency: 5x week            Contractures Contractures Info: Not present    Additional Factors Info  Code Status, Allergies, Insulin Sliding Scale Code Status Info: Full Code Allergies Info: Aspirin, Bee Pollen, Codeine, Fish Oil, Sulfa Antibiotics, Hornet Venom, Iron, Pantoprazole, Tape, Camphor, Lisinopril, Penicillins, Sulfasalazine, Vicodin (Hydrocodone-acetaminophen)  Insulin Sliding Scale Info: insulin aspart (novoLOG) injection 0-9 Units 3x daily with meals       Current Medications (12/12/2019):  This is the current hospital active medication list Current Facility-Administered Medications  Medication Dose  Route Frequency Provider Last Rate Last Admin  . acetaminophen (TYLENOL) tablet 650 mg  650 mg Oral Q6H PRN Lenore Cordia, MD   650 mg at 12/12/19 0941   Or  . acetaminophen (TYLENOL) suppository 650 mg  650 mg Rectal Q6H PRN Zada Finders R, MD      . atorvastatin (LIPITOR) tablet 80 mg  80 mg Oral q1800 Zada Finders R, MD      . famotidine (PEPCID) tablet 20 mg  20 mg Oral BID Zada Finders R, MD   20 mg at 12/12/19 0940  . furosemide (LASIX) tablet 40 mg  40 mg Oral q1800 Zada Finders R, MD      . furosemide (LASIX) tablet 80 mg  80 mg Oral Q24H Lenore Cordia, MD   80 mg at 12/12/19 0940  . gabapentin (NEURONTIN) capsule 100 mg  100 mg Oral Q8H PRN Zada Finders R, MD      . insulin aspart (novoLOG) injection 0-9 Units  0-9 Units Subcutaneous TID WC Lenore Cordia, MD   1 Units at 12/12/19 1216  . levothyroxine (SYNTHROID) tablet 75 mcg  75 mcg Oral QAC breakfast Lenore Cordia, MD   75 mcg at 12/12/19 0536  . losartan (COZAAR) tablet 12.5 mg  12.5 mg Oral Daily Zada Finders R, MD   12.5 mg at 12/12/19 0940  . melatonin tablet 3 mg  3 mg Oral QHS Lenore Cordia, MD   3 mg at 12/12/19 0217  . metoprolol succinate (TOPROL-XL) 24 hr tablet 25 mg  25 mg Oral Daily Lenore Cordia, MD   25 mg at 12/12/19 0941  . morphine 2 MG/ML injection 1 mg  1 mg Intravenous Q4H PRN Lenore Cordia, MD   1 mg at 12/12/19 0535  . ondansetron (ZOFRAN) tablet 4 mg  4 mg Oral Q6H PRN Lenore Cordia, MD       Or  . ondansetron (ZOFRAN) injection 4 mg  4 mg Intravenous Q6H PRN Zada Finders R, MD      . oxyCODONE (Oxy IR/ROXICODONE) immediate release tablet 5 mg  5 mg Oral Q4H PRN Lenore Cordia, MD   5 mg at 12/12/19 1216  . senna-docusate (Senokot-S) tablet 1 tablet  1 tablet Oral QHS PRN Zada Finders R, MD      . spironolactone (ALDACTONE) tablet 12.5 mg  12.5 mg Oral QHS Zada Finders R, MD   12.5 mg at 12/12/19 0217     Discharge Medications: Please see discharge summary for a list of discharge  medications.  Relevant Imaging Results:  Relevant Lab Results:   Additional Information SSN: [id number]  Alexander Mt, LCSW

## 2019-12-12 NOTE — Evaluation (Signed)
Occupational Therapy Evaluation Patient Details Name: Megan [name] MRN: [id number] DOB: [birth year] Today's Date: 12/12/2019    History of Present Illness 74 y.o. female with medical history significant for CAD s/p CABG September [id number], chronic diastolic CHF, hypertension, type 2 diabetes, CKD stage III, hypothyroidism, hyperlipidemia, anxiety, obesity, and OSA who presents to the ED for evaluation of left arm pain after a fall. Pt found to have acute fx of L radius and ulna, requiring non-emergent surgery.   Clinical Impression   PTA patient living at home alone with PCA assist as needed for ADLs, IADLs, but using RW for mobility with modified independence.  Patient admitted for above and limited by problem list below, including L UE pain, decreased functional ROM of R shoulder, impaired balance, decreased activity tolerance, and generalized weakness. She was educated on importance of NWB to RUE, maintaining elevation and using ice for pain mgmt/edema.  Patient requires min-total assist for ADLs, declines further transfers at this time as had just transferred to recliner with PT. Patient will benefit from further OT services while admitted and after dc at SNF level to optimize independence and safety with ADLs and mobility prior to dc home.      Follow Up Recommendations  SNF;Supervision/Assistance - 24 hour    Equipment Recommendations  Other (comment)(TBD at next venue of care)    Recommendations for Other Services Other (comment)(Chaplin Services )     Precautions / Restrictions Precautions Precautions: Fall Restrictions Weight Bearing Restrictions: Yes LUE Weight Bearing: Non weight bearing      Mobility Bed Mobility Overal bed mobility: Needs Assistance Bed Mobility: Supine to Sit     Supine to sit: Mod assist;HOB elevated     General bed mobility comments: OOB in recliner upon entry  Transfers Overall transfer level: Needs assistance Equipment used: 1 person hand  held assist Transfers: Sit to/from [facility] Sit to Stand: Mod assist;From elevated surface Stand pivot transfers: Min assist       General transfer comment: in recliner upon entry, declined further mobility due to pain     Balance Overall balance assessment: Needs assistance Sitting-balance support: Feet supported;Single extremity supported Sitting balance-Leahy Scale: Fair Sitting balance - Comments: close supervision   Standing balance support: Single extremity supported Standing balance-Leahy Scale: Poor Standing balance comment: minA with RUE support                           ADL either performed or assessed with clinical judgement   ADL Overall ADL's : Needs assistance/impaired     Grooming: Set up;Sitting   Upper Body Bathing: Maximal assistance;Sitting   Lower Body Bathing: Maximal assistance;Sitting/lateral leans   Upper Body Dressing : Maximal assistance;Sitting   Lower Body Dressing: Total assistance;Sitting/lateral leans     Toilet Transfer Details (indicate cue type and reason): deferred due to pt pain            General ADL Comments: pt limited by decreased functional use of L UE, pain, activity tolerance and weakness      Vision   Vision Assessment?: No apparent visual deficits     Perception     Praxis      Pertinent Vitals/Pain Pain Assessment: Faces Pain Score: 5  Faces Pain Scale: Hurts even more Pain Location: LUE Pain Descriptors / Indicators: Grimacing Pain Intervention(s): Limited activity within patient's tolerance;Monitored during session;Repositioned     Hand Dominance Right   Extremity/Trunk Assessment Upper Extremity Assessment Upper  Extremity Assessment: Generalized weakness;LUE deficits/detail;RUE deficits/detail RUE Deficits / Details: limited shoulder flexion to 90* due to pain, overall 3/5 strength  LUE Deficits / Details: limited by pain, splinted from distal humerus to hand due to  fx LUE Sensation: decreased light touch LUE Coordination: decreased fine motor;decreased gross motor   Lower Extremity Assessment Lower Extremity Assessment: Defer to PT evaluation   Cervical / Trunk Assessment Cervical / Trunk Assessment: Normal   Communication Communication Communication: No difficulties   Cognition Arousal/Alertness: Awake/alert Behavior During Therapy: WFL for tasks assessed/performed Overall Cognitive Status: Within Functional Limits for tasks assessed                                 General Comments: pt labile during session, expressing difficult several years with loss of family and medical problems-- supported as able and will refer to chaplin services    General Comments  VSS on RA    Exercises     Shoulder Instructions      Home Living Family/patient expects to be discharged to:: Private residence Living Arrangements: Alone Available Help at Discharge: Family;Personal care attendant;Available PRN/intermittently(PCA 8-12, 4-8 daily ) Type of Home: House Home Access: Stairs to enter [employer] of Steps: 1 (small threshold) Entrance Stairs-Rails: None Home Layout: One level     Bathroom Shower/Tub: Occupational psychologist: Handicapped height     Home Equipment: Shower seat - built in;Grab bars - tub/shower;Hand held shower head;Cane - single point;Walker - 2 wheels;Walker - 4 wheels;Wheelchair - [employer];Tub bench;Adaptive equipment Adaptive Equipment: Reacher;Sock aid Additional Comments: lift chair      Prior Functioning/Environment Level of Independence: Needs assistance  Gait / Transfers Assistance Needed: ambulates household distances with use of RW, dependent on BUE support per pt. Pt reports also wheeling around the house in a wheelchair on days she feels weaker ADL's / Homemaking Assistance Needed: PCA assisting with bathing/dressing as needed, toileting/toilet transfers with RW  modified independently             OT Problem List: Decreased strength;Decreased activity tolerance;Decreased range of motion;Impaired balance (sitting and/or standing);Decreased coordination;Decreased knowledge of use of DME or AE;Decreased knowledge of precautions;Pain;Impaired UE functional use      OT Treatment/Interventions: Self-care/ADL training;DME and/or AE instruction;Therapeutic activities;Patient/family education;Balance training;Therapeutic exercise    OT Goals(Current goals can be found in the care plan section) Acute Rehab OT Goals Patient Stated Goal: To go to rehab and get stronger OT Goal Formulation: With patient Time For Goal Achievement: 12/26/19 Potential to Achieve Goals: Good  OT Frequency: Min 2X/week   Barriers to D/C:            Co-evaluation              AM-PAC OT "6 Clicks" Daily Activity     Outcome Measure Help from another person eating meals?: A Little Help from another person taking care of personal grooming?: A Little Help from another person toileting, which includes using toliet, bedpan, or urinal?: Total Help from another person bathing (including washing, rinsing, drying)?: A Lot Help from another person to put on and taking off regular upper body clothing?: A Lot Help from another person to put on and taking off regular lower body clothing?: Total 6 Click Score: 12   End of Session Nurse Communication: Mobility status;Other (comment)(request for chaplin services, pain )  Activity Tolerance: Patient limited by pain Patient left: in chair;with call  bell/phone within reach;with chair alarm set  OT Visit Diagnosis: Other abnormalities of gait and mobility (R26.89);Muscle weakness (generalized) (M62.81);Pain;History of falling (Z91.81) Pain - Right/Left: Left Pain - part of body: Arm;Hand                Time: 1003-1030 OT Time Calculation (min): 27 min Charges:  OT General Charges $OT Visit: 1 Visit OT Evaluation $OT Eval  Moderate Complexity: 1 Mod OT Treatments $Self Care/Home Management : 8-22 mins  Jolaine Artist, OT Acute Rehabilitation Services Pager [phone] Office [phone]   Megan [name] 12/12/2019, 10:47 AM

## 2019-12-13 ENCOUNTER — Encounter (HOSPITAL_COMMUNITY): Payer: Self-pay | Admitting: Internal Medicine

## 2019-12-13 ENCOUNTER — Encounter (HOSPITAL_COMMUNITY)
Admission: EM | Disposition: A | Discharge status: Skilled Nursing Facility | Payer: Self-pay | Source: Home / Self Care | Attending: Internal Medicine

## 2019-12-13 ENCOUNTER — Inpatient Hospital Stay (HOSPITAL_COMMUNITY): Payer: Medicare Other | Admitting: Certified Registered Nurse Anesthetist

## 2019-12-13 HISTORY — PX: ORIF RADIAL FRACTURE: SHX5113

## 2019-12-13 LAB — GLUCOSE, CAPILLARY
Glucose-Capillary: 105 mg/dL — ABNORMAL HIGH (ref 70–99)
Glucose-Capillary: 117 mg/dL — ABNORMAL HIGH (ref 70–99)
Glucose-Capillary: 136 mg/dL — ABNORMAL HIGH (ref 70–99)
Glucose-Capillary: 256 mg/dL — ABNORMAL HIGH (ref 70–99)

## 2019-12-13 SURGERY — OPEN REDUCTION INTERNAL FIXATION (ORIF) RADIAL FRACTURE
Anesthesia: Monitor Anesthesia Care | Laterality: Left

## 2019-12-13 MED ORDER — ONDANSETRON HCL 4 MG/2ML IJ SOLN
INTRAMUSCULAR | Status: DC | PRN
  Administered 2019-12-13: 4 mg via INTRAVENOUS

## 2019-12-13 MED ORDER — BUPIVACAINE HCL (PF) 0.25 % IJ SOLN
INTRAMUSCULAR | Status: AC
  Filled 2019-12-13: qty 30

## 2019-12-13 MED ORDER — PHENYLEPHRINE HCL-NACL 10-0.9 MG/250ML-% IV SOLN
INTRAVENOUS | Status: DC | PRN
  Administered 2019-12-13: 25 ug/min via INTRAVENOUS

## 2019-12-13 MED ORDER — FENTANYL CITRATE (PF) 100 MCG/2ML IJ SOLN
INTRAMUSCULAR | Status: AC
  Administered 2019-12-13: 50 ug via INTRAVENOUS
  Filled 2019-12-13: qty 2

## 2019-12-13 MED ORDER — BUPIVACAINE-EPINEPHRINE (PF) 0.5% -1:200000 IJ SOLN
INTRAMUSCULAR | Status: DC | PRN
  Administered 2019-12-13: 30 mL

## 2019-12-13 MED ORDER — FENTANYL CITRATE (PF) 250 MCG/5ML IJ SOLN
INTRAMUSCULAR | Status: DC | PRN
  Administered 2019-12-13: 25 ug via INTRAVENOUS
  Administered 2019-12-13 (×4): 50 ug via INTRAVENOUS
  Administered 2019-12-13: 25 ug via INTRAVENOUS

## 2019-12-13 MED ORDER — MIDAZOLAM HCL 2 MG/2ML IJ SOLN: 2.0000 mg | Freq: Once | INTRAMUSCULAR | Status: AC

## 2019-12-13 MED ORDER — FENTANYL CITRATE (PF) 100 MCG/2ML IJ SOLN: 25.0000 ug | INTRAMUSCULAR | Status: DC | PRN

## 2019-12-13 MED ORDER — FENTANYL CITRATE (PF) 100 MCG/2ML IJ SOLN: 100.0000 ug | Freq: Once | INTRAMUSCULAR | Status: AC

## 2019-12-13 MED ORDER — PROMETHAZINE HCL 25 MG/ML IJ SOLN: 6.2500 mg | INTRAMUSCULAR | Status: DC | PRN

## 2019-12-13 MED ORDER — LACTATED RINGERS IV SOLN: INTRAVENOUS | Status: DC

## 2019-12-13 MED ORDER — BUPIVACAINE HCL (PF) 0.25 % IJ SOLN
INTRAMUSCULAR | Status: DC | PRN
  Administered 2019-12-13: 10 mL

## 2019-12-13 MED ORDER — MIDAZOLAM HCL 2 MG/2ML IJ SOLN
INTRAMUSCULAR | Status: AC
  Administered 2019-12-13: 1 mg via INTRAVENOUS
  Filled 2019-12-13: qty 2

## 2019-12-13 MED ORDER — PROPOFOL 10 MG/ML IV BOLUS
INTRAVENOUS | Status: AC
  Filled 2019-12-13: qty 20

## 2019-12-13 MED ORDER — LIDOCAINE 2% (20 MG/ML) 5 ML SYRINGE
INTRAMUSCULAR | Status: DC | PRN
  Administered 2019-12-13: 40 mg via INTRAVENOUS

## 2019-12-13 MED ORDER — CLONIDINE HCL (ANALGESIA) 100 MCG/ML EP SOLN
EPIDURAL | Status: DC | PRN
  Administered 2019-12-13: 50 ug

## 2019-12-13 MED ORDER — FENTANYL CITRATE (PF) 250 MCG/5ML IJ SOLN
INTRAMUSCULAR | Status: AC
  Filled 2019-12-13: qty 5

## 2019-12-13 MED ORDER — ACETAMINOPHEN 10 MG/ML IV SOLN
INTRAVENOUS | Status: DC | PRN
  Administered 2019-12-13: 1000 mg via INTRAVENOUS

## 2019-12-13 MED ORDER — PROPOFOL 500 MG/50ML IV EMUL
INTRAVENOUS | Status: DC | PRN
  Administered 2019-12-13: 50 ug/kg/min via INTRAVENOUS

## 2019-12-13 MED ORDER — DEXAMETHASONE SODIUM PHOSPHATE 10 MG/ML IJ SOLN
INTRAMUSCULAR | Status: DC | PRN
  Administered 2019-12-13: 5 mg via INTRAVENOUS

## 2019-12-13 MED ORDER — PROPOFOL 10 MG/ML IV BOLUS
INTRAVENOUS | Status: DC | PRN
  Administered 2019-12-13: 130 mg via INTRAVENOUS
  Administered 2019-12-13: 20 mg via INTRAVENOUS

## 2019-12-13 SURGICAL SUPPLY — 75 items
BIT DRILL 110X2.5XQCK CNCT (BIT) ×1 IMPLANT
BIT DRILL 2.5 (BIT) ×3
BIT DRILL 2.7 (BIT) ×1
BIT DRILL 2.7MM (BIT) ×1 IMPLANT
BIT DRILL Q-C 2.0 DIA 100 (BIT) ×2 IMPLANT
BIT DRILL Q-C 2.0MM DIA 100MM (BIT) ×1
BIT DRILL STD 2.0MM (DRILL) ×1 IMPLANT
BIT DRL 110X2.5XQCK CNCT (BIT) ×1
BLADE CLIPPER SURG (BLADE) IMPLANT
BNDG ELASTIC 3X5.8 VLCR STR LF (GAUZE/BANDAGES/DRESSINGS) ×3 IMPLANT
BNDG ELASTIC 4X5.8 VLCR STR LF (GAUZE/BANDAGES/DRESSINGS) ×3 IMPLANT
BNDG ESMARK 4X9 LF (GAUZE/BANDAGES/DRESSINGS) ×3 IMPLANT
BNDG GAUZE ELAST 4 BULKY (GAUZE/BANDAGES/DRESSINGS) ×3 IMPLANT
CLOSURE WOUND 1/2 X4 (GAUZE/BANDAGES/DRESSINGS)
CORD BIPOLAR FORCEPS 12FT (ELECTRODE) ×3 IMPLANT
COVER SURGICAL LIGHT HANDLE (MISCELLANEOUS) ×3 IMPLANT
COVER WAND RF STERILE (DRAPES) ×3 IMPLANT
CUFF TOURN SGL QUICK 18X4 (TOURNIQUET CUFF) ×3 IMPLANT
CUFF TOURN SGL QUICK 24 (TOURNIQUET CUFF)
CUFF TRNQT CYL 24X4X16.5-23 (TOURNIQUET CUFF) IMPLANT
DRAIN TLS ROUND 10FR (DRAIN) IMPLANT
DRAPE OEC MINIVIEW 54X84 (DRAPES) ×3 IMPLANT
DRAPE SURG 17X11 SM STRL (DRAPES) ×3 IMPLANT
DRILL BIT 2.7MM (BIT) ×3
DRILL STANDARD 2.0MM (DRILL) ×3
DRSG ADAPTIC 3X8 NADH LF (GAUZE/BANDAGES/DRESSINGS) ×3 IMPLANT
ELECT REM PT RETURN 9FT ADLT (ELECTROSURGICAL)
ELECTRODE REM PT RTRN 9FT ADLT (ELECTROSURGICAL) IMPLANT
GAUZE 4X4 16PLY RFD (DISPOSABLE) ×3 IMPLANT
GAUZE SPONGE 4X4 12PLY STRL (GAUZE/BANDAGES/DRESSINGS) ×3 IMPLANT
GLOVE BIOGEL PI IND STRL 8.5 (GLOVE) ×1 IMPLANT
GLOVE BIOGEL PI INDICATOR 8.5 (GLOVE) ×2
GLOVE SURG ORTHO 8.0 STRL STRW (GLOVE) ×3 IMPLANT
GOWN STRL REUS W/ TWL LRG LVL3 (GOWN DISPOSABLE) ×3 IMPLANT
GOWN STRL REUS W/ TWL XL LVL3 (GOWN DISPOSABLE) ×1 IMPLANT
GOWN STRL REUS W/TWL LRG LVL3 (GOWN DISPOSABLE) ×9
GOWN STRL REUS W/TWL XL LVL3 (GOWN DISPOSABLE) ×3
KIT BASIN OR (CUSTOM PROCEDURE TRAY) ×3 IMPLANT
KIT TURNOVER KIT B (KITS) ×3 IMPLANT
MANIFOLD NEPTUNE II (INSTRUMENTS) ×3 IMPLANT
NEEDLE HYPO 25X1 1.5 SAFETY (NEEDLE) ×3 IMPLANT
NS IRRIG 1000ML POUR BTL (IV SOLUTION) ×3 IMPLANT
PACK ORTHO EXTREMITY (CUSTOM PROCEDURE TRAY) ×3 IMPLANT
PAD ARMBOARD 7.5X6 YLW CONV (MISCELLANEOUS) ×6 IMPLANT
PAD CAST 4YDX4 CTTN HI CHSV (CAST SUPPLIES) ×1 IMPLANT
PADDING CAST COTTON 4X4 STRL (CAST SUPPLIES) ×3
PLATE 6H COMP LOCKING DUAL 3.5 (Plate) ×3 IMPLANT
PLATE COMP 6HOLE 2.7MM (Plate) ×3 IMPLANT
SCREW 2.7X16MM (Screw) ×3 IMPLANT
SCREW CORTICAL 2.7X14MM (Screw) ×6 IMPLANT
SCREW CORTICAL 3.5 16MM (Screw) ×6 IMPLANT
SCREW CORTICAL 3.5X14 (Screw) ×12 IMPLANT
SCREW LOCK 14X2.7X NS (Screw) ×2 IMPLANT
SCREW LOCKING 2.7X12 (Screw) ×3 IMPLANT
SCREW LOCKING 2.7X14 (Screw) ×6 IMPLANT
SCREW NLOCK CORT 2.7X16 NS (Screw) ×1 IMPLANT
SOAP 2 % CHG 4 OZ (WOUND CARE) ×3 IMPLANT
STRIP CLOSURE SKIN 1/2X4 (GAUZE/BANDAGES/DRESSINGS) IMPLANT
SUCTION FRAZIER HANDLE 10FR (MISCELLANEOUS) ×3
SUCTION TUBE FRAZIER 10FR DISP (MISCELLANEOUS) ×1 IMPLANT
SUT ETHILON 4 0 PS 2 18 (SUTURE) IMPLANT
SUT MNCRL AB 4-0 PS2 18 (SUTURE) IMPLANT
SUT PROLENE 4 0 PS 2 18 (SUTURE) ×6 IMPLANT
SUT PROLENE 6 0 P 3 18 (SUTURE) IMPLANT
SUT VIC AB 2-0 FS1 27 (SUTURE) IMPLANT
SUT VIC AB 4-0 PS2 18 (SUTURE) ×6 IMPLANT
SUT VICRYL 4-0 PS2 18IN ABS (SUTURE) IMPLANT
SYR CONTROL 10ML LL (SYRINGE) IMPLANT
SYSTEM CHEST DRAIN TLS 7FR (DRAIN) IMPLANT
TOWEL GREEN STERILE (TOWEL DISPOSABLE) ×3 IMPLANT
TOWEL GREEN STERILE FF (TOWEL DISPOSABLE) ×3 IMPLANT
TUBE CONNECTING 12'X1/4 (SUCTIONS) ×1
TUBE CONNECTING 12X1/4 (SUCTIONS) ×2 IMPLANT
WATER STERILE IRR 1000ML POUR (IV SOLUTION) ×3 IMPLANT
YANKAUER SUCT BULB TIP NO VENT (SUCTIONS) IMPLANT

## 2019-12-13 NOTE — Plan of Care (Signed)
  Problem: Education: Goal: Knowledge of General Education information will improve Description: Including pain rating scale, medication(s)/side effects and non-pharmacologic comfort measures Outcome: Progressing   Problem: Health Behavior/Discharge Planning: Goal: Ability to manage health-related needs will improve Outcome: Progressing   Problem: Clinical Measurements: Goal: Ability to maintain clinical measurements within normal limits will improve Outcome: Progressing Goal: Will remain free from infection Outcome: Progressing Note: MRSA swab positive. Medication given in nares Goal: Diagnostic test results will improve Outcome: Progressing Goal: Respiratory complications will improve Outcome: Progressing Goal: Cardiovascular complication will be avoided Outcome: Progressing   Problem: Nutrition: Goal: Adequate nutrition will be maintained Outcome: Progressing   Problem: Coping: Goal: Level of anxiety will decrease Outcome: Progressing Note: Spiritual Services contacted yesterday and saw patient.   Problem: Elimination: Goal: Will not experience complications related to bowel motility Outcome: Progressing

## 2019-12-13 NOTE — Anesthesia Postprocedure Evaluation (Signed)
Anesthesia Post Note  Patient: KERRI-ANN WILDES  Procedure(s) Performed: OPEN REDUCTION INTERNAL FIXATION (ORIF) FOREARM FRACTURE (Left )     Patient location during evaluation: PACU Anesthesia Type: General Level of consciousness: awake and alert Pain management: pain level controlled Vital Signs Assessment: post-procedure vital signs reviewed and stable Respiratory status: spontaneous breathing, nonlabored ventilation, respiratory function stable and patient connected to nasal cannula oxygen Cardiovascular status: blood pressure returned to baseline and stable Postop Assessment: no apparent nausea or vomiting Anesthetic complications: no    Last Vitals:  Vitals:   12/13/19 1828 12/13/19 2029  BP: (!) 117/56 (!) 109/50  Pulse: 66 75  Resp: 16 18  Temp: 36.7 C (!) 36.3 C  SpO2: 94% 97%    Last Pain:  Vitals:   12/13/19 2029  TempSrc: Oral  PainSc:                  Tiajuana Amass

## 2019-12-13 NOTE — Progress Notes (Signed)
PROGRESS NOTE    Megan [name]  [id number] DOB: [birth year] DOA: 12/11/2019 PCP: [name], MD    Brief Narrative:  74 year old female with history of coronary artery disease status post CABG in September [id number], chronic diastolic heart failure, hypertension, type 2 diabetes, chronic kidney disease stage III AAA, hypothyroidism and hyperlipidemia, anxiety and sleep apnea, multiple recent fractures.  She walks with a walker at home.  Presented to the ER with left arm pain after falling at home. In the emergency room, hemodynamically stable.  Skeletal survey negative except left forearm fractures.  Patient unable to ambulate and unsafe to go home.  Admitted for inpatient treatment, surgical fixation.   Assessment & Plan:   Principal Problem:   Closed fracture of left radius and ulna Active Problems:   Chronic diastolic CHF (congestive heart failure) (HCC)   DM (diabetes mellitus), type 2 with complications (HCC)   CKD (chronic kidney disease) stage 3, GFR 30-59 ml/min   Hypothyroidism   S/P CABG x 2   CAD (coronary artery disease)   Hyperlipidemia associated with type 2 diabetes mellitus ([place])   Hypertension associated with diabetes ([place])   Fracture closed of upper end of forearm  Closed fracture of left radius and ulna: Patient for surgical fixation today.  Post surgical management as per surgery.  Adequate pain medications. With recent multiple fractures and medical issues, she is deconditioned and will need rehab.  Refer to inpatient physical therapy at the skilled nursing rehab.  Coronary artery disease status post CABG: Denies any chest pain.  Plavix on hold.  Will resume after surgery.  Currently remains on metoprolol and atorvastatin.  Chronic diastolic dysfunction: Euvolemic and well compensated.  On maintenance Lasix that is continued.  Type 2 diabetes: On Metformin at home.  Remains on sliding scale insulin.  Hypertension: On home medications including  metoprolol, losartan, spironolactone and Lasix.  Hypothyroidism: On Synthroid.  DVT prophylaxis: SCDs prior to surgery. Code Status: Full code Family Communication: None Disposition Plan: patient is from home. Anticipated DC to SNF, Barriers to discharge going for surgery today   Consultants:   Orthopedics  Procedures:   Going for surgery today  Antimicrobials:   None   Subjective: Patient seen and examined in the morning rounds.  She was ready to go to surgery.  Denied any overnight events.  Objective: Vitals:   12/13/19 1405 12/13/19 1407 12/13/19 1410 12/13/19 1411  BP:      Pulse: 73 73 76 77  Resp: 16 (!) 23 17 17   Temp:      TempSrc:      SpO2: 98% 99% 99% 100%    Intake/Output Summary (Last 24 hours) at 12/13/2019 1547 Last data filed at 12/13/2019 1220 Gross per 24 hour  Intake 360 ml  Output 1400 ml  Net -1040 ml   There were no vitals filed for this visit.  Examination:  General exam: Appears calm and comfortable  Respiratory system: Clear to auscultation. Respiratory effort normal. Cardiovascular system: S1 & S2 heard, RRR. No JVD, murmurs, rubs, gallops or clicks. No pedal edema. Gastrointestinal system: Abdomen is nondistended, soft and nontender. No organomegaly or masses felt. Normal bowel sounds heard. Central nervous system: Alert and oriented. No focal neurological deficits. Extremities: Symmetric 5 x 5 power. Skin: No rashes, lesions or ulcers Psychiatry: Judgement and insight appear normal. Mood & affect appropriate.  Left forearm on splint.  Distal neurovascular status intact.   Data Reviewed: I have personally reviewed following labs and imaging studies  CBC: Recent Labs  Lab 12/11/19 2117 12/12/19 0349  WBC 8.1 6.8  NEUTROABS 6.2  --   HGB 12.2 10.5*  HCT 40.4 33.5*  MCV 98.3 94.9  PLT 260 [id number]   Basic Metabolic Panel: Recent Labs  Lab 12/11/19 2117 12/12/19 0349  NA 141 141  K 4.3 3.8  CL 102 104  CO2 25 26    GLUCOSE 139* 151*  BUN 28* 29*  CREATININE 0.99 1.04*  CALCIUM 9.8 9.2   GFR: CrCl cannot be calculated (Unknown ideal weight.). Liver Function Tests: No results for input(s): AST, ALT, ALKPHOS, BILITOT, PROT, ALBUMIN in the last 168 hours. No results for input(s): LIPASE, AMYLASE in the last 168 hours. No results for input(s): AMMONIA in the last 168 hours. Coagulation Profile: No results for input(s): INR, PROTIME in the last 168 hours. Cardiac Enzymes: No results for input(s): CKTOTAL, CKMB, CKMBINDEX, TROPONINI in the last 168 hours. BNP (last 3 results) No results for input(s): PROBNP in the last 8760 hours. HbA1C: Recent Labs    12/11/19 2113  HGBA1C 6.0*   CBG: Recent Labs  Lab 12/12/19 1144 12/12/19 1649 12/12/19 2117 12/13/19 0803 12/13/19 1215  GLUCAP 122* 145* 161* 105* 117*   Lipid Profile: No results for input(s): CHOL, HDL, LDLCALC, TRIG, CHOLHDL, LDLDIRECT in the last 72 hours. Thyroid Function Tests: No results for input(s): TSH, T4TOTAL, FREET4, T3FREE, THYROIDAB in the last 72 hours. Anemia Panel: No results for input(s): VITAMINB12, FOLATE, FERRITIN, TIBC, IRON, RETICCTPCT in the last 72 hours. Sepsis Labs: No results for input(s): PROCALCITON, LATICACIDVEN in the last 168 hours.  Recent Results (from the past 240 hour(s))  SARS CORONAVIRUS 2 (TAT 6-24 HRS) Nasopharyngeal Nasopharyngeal Swab     Status: None   Collection Time: 12/11/19 11:18 PM   Specimen: Nasopharyngeal Swab  Result Value Ref Range Status   SARS Coronavirus 2 NEGATIVE NEGATIVE Final    Comment: (NOTE) SARS-CoV-2 target nucleic acids are NOT DETECTED. The SARS-CoV-2 RNA is generally detectable in upper and lower respiratory specimens during the acute phase of infection. Negative results do not preclude SARS-CoV-2 infection, do not rule out co-infections with other pathogens, and should not be used as the sole basis for treatment or other patient management  decisions. Negative results must be combined with clinical observations, patient history, and epidemiological information. The expected result is Negative. Fact Sheet for Patients: SugarRoll.be Fact Sheet for Healthcare Providers: https://www.woods-mathews.com/ This test is not yet approved or cleared by the [place] FDA and  has been authorized for detection and/or diagnosis of SARS-CoV-2 by FDA under an Emergency Use Authorization (EUA). This EUA will remain  in effect (meaning this test can be used) for the duration of the COVID-19 declaration under Section 56 4(b)(1) of the Act, 21 U.S.C. section 360bbb-3(b)(1), unless the authorization is terminated or revoked sooner. Performed at [facility], [place] [address], [place], [place] [address]   Surgical pcr screen     Status: Abnormal   Collection Time: 12/12/19  9:08 PM   Specimen: Nasal Mucosa; Nasal Swab  Result Value Ref Range Status   MRSA, PCR POSITIVE (A) NEGATIVE Final    Comment: CRITICAL RESULT CALLED TO, READ BACK BY AND VERIFIED WITH: RN MARIA RASING [id number] @2334  THANEY    Staphylococcus aureus POSITIVE (A) NEGATIVE Final    Comment: CRITICAL RESULT CALLED TO, READ BACK BY AND VERIFIED WITH: RN Lupe Carney [id number] @2334  [facility] Performed at [facility] [address], [place], [place]  [id number]          Radiology Studies: DG Forearm Left  Result Date: 12/11/2019 CLINICAL DATA:  74 year old female status post fall with pain. EXAM: LEFT FOREARM - 2 VIEW COMPARISON:  None. FINDINGS: Transverse but comminuted both-bone forearm fracture. Midshaft fractures of the left radius and ulna. 1/2 shaft with posterior displacement of the ulna, with similar anterior displacement but posterior angulation of the radius fracture. Alignment appears preserved at the left elbow and wrist. No other acute osseous abnormality identified. IMPRESSION: Acute transverse and mildly  comminuted midshaft fractures of the left radius and ulna. Electronically Signed   By: Genevie Ann M.D.   On: 12/11/2019 19:06   CT Head Wo Contrast  Result Date: 12/11/2019 CLINICAL DATA:  Fill over chair, with positive head strike EXAM: CT HEAD WITHOUT CONTRAST TECHNIQUE: Contiguous axial images were obtained from the base of the skull through the vertex without intravenous contrast. COMPARISON:  CT 09/20/2013 FINDINGS: Brain: No evidence of acute infarction, hemorrhage, hydrocephalus, extra-axial collection or mass lesion/mass effect. Symmetric prominence of the ventricles, cisterns and sulci compatible with parenchymal volume loss. Patchy areas of white matter hypoattenuation are most compatible with chronic microvascular angiopathy. Vascular: Atherosclerotic calcification of the carotid siphons. No hyperdense vessel. Skull: No significant scalp swelling, hematoma or calvarial fracture. Moderate hyperostosis frontalis interna, a benign incidental finding. No worrisome or suspicious osseous lesions. Sinuses/Orbits: Paranasal sinuses and mastoid air cells are predominantly clear. Pneumatization of the petrous apices noted incidentally. Orbital structures are unremarkable aside from prior lens extractions. Other: None IMPRESSION: 1. No acute intracranial findings. No significant scalp swelling, hematoma, or calvarial fracture. 2. Mild parenchymal volume loss and chronic microvascular angiopathy. Electronically Signed   By: Lovena Le M.D.   On: 12/11/2019 20:43   CT Cervical Spine Wo Contrast  Result Date: 12/11/2019 CLINICAL DATA:  [place] over chair with positive head strike EXAM: CT CERVICAL SPINE WITHOUT CONTRAST TECHNIQUE: Multidetector CT imaging of the cervical spine was performed without intravenous contrast. Multiplanar CT image reconstructions were also generated. COMPARISON:  Cervical spine radiographs Jan 03, 2006 FINDINGS: Alignment: Preservation of the normal cervical lordosis without traumatic  listhesis. No abnormally widened, perched or jumped facets. Normal alignment of the craniocervical and atlantoaxial articulations. Skull base and vertebrae: No acute fracture. No primary bone lesion or focal pathologic process. Incomplete fusion of the spinous process of C6. Soft tissues and spinal canal: No pre or paravertebral fluid or swelling. No visible canal hematoma. Disc levels: Minimal spondylitic changes. No significant central canal or foraminal stenosis identified within the imaged levels of the spine. Mild-to-moderate atlantodental arthrosis. Upper chest: No acute abnormality in the upper chest or imaged lung apices. Other: Normal thyroid. Cervical carotid atherosclerosis seen bilaterally at the bifurcations. IMPRESSION: 1. No acute fracture or traumatic listhesis of the cervical spine. 2. Minimal spondylitic changes. 3. Cervical carotid atherosclerosis. Electronically Signed   By: Lovena Le M.D.   On: 12/11/2019 20:46   DG Knee Complete 4 Views Left  Result Date: 12/11/2019 CLINICAL DATA:  74 year old female status post fall with pain. EXAM: LEFT KNEE - COMPLETE 4+ VIEW COMPARISON:  01/08/2019. FINDINGS: No joint effusion on the cross-table lateral view. New surgical clips in the medial popliteal region since last year. Chronic tricompartmental degenerative spurring. Osteopenia. No acute osseous abnormality identified. No discrete soft tissue injury. IMPRESSION: No acute fracture or dislocation identified about the left knee. Electronically Signed   By: Genevie Ann M.D.   On: 12/11/2019 19:04  Scheduled Meds: . [MAR Hold] atorvastatin  80 mg Oral q1800  . [MAR Hold] Chlorhexidine Gluconate Cloth  6 each Topical Q0600  . [MAR Hold] famotidine  20 mg Oral BID  . [MAR Hold] furosemide  40 mg Oral q1800  . [MAR Hold] furosemide  80 mg Oral Q24H  . [MAR Hold] insulin aspart  0-9 Units Subcutaneous TID WC  . [MAR Hold] levothyroxine  75 mcg Oral QAC breakfast  . [MAR Hold] losartan   12.5 mg Oral Daily  . [MAR Hold] melatonin  3 mg Oral QHS  . [MAR Hold] metoprolol succinate  25 mg Oral Daily  . [MAR Hold] mupirocin ointment  1 application Nasal BID  . [MAR Hold] spironolactone  12.5 mg Oral QHS   Continuous Infusions: . lactated ringers 10 mL/hr at 12/13/19 1257     LOS: 1 day    Time spent: 25 minutes    Barb Merino, MD [facility] Pager [phone]

## 2019-12-13 NOTE — Anesthesia Procedure Notes (Signed)
Anesthesia Regional Block: Axillary brachial plexus block   Pre-Anesthetic Checklist: ,, timeout performed, Correct Patient, Correct Site, Correct Laterality, Correct Procedure, Correct Position, site marked, Risks and benefits discussed,  Surgical consent,  Pre-op evaluation,  At surgeon's request and post-op pain management  Laterality: Left  Prep: chloraprep       Needles:  Injection technique: Single-shot  Needle Type: Echogenic Stimulator Needle     Needle Length: 9cm  Needle Gauge: 21     Additional Needles:   Procedures:, nerve stimulator,,, ultrasound used (permanent image in chart),,,,   Nerve Stimulator or Paresthesia:  Response: MC, median and radial responses, 0.5 mA,   Additional Responses:   Narrative:  Start time: 12/13/2019 1:43 PM End time: 12/13/2019 1:50 PM Injection made incrementally with aspirations every 5 mL.  Performed by: Personally  Anesthesiologist: Suzette Battiest, MD

## 2019-12-13 NOTE — Transfer of Care (Signed)
Immediate Anesthesia Transfer of Care Note  Patient: Megan [name]  Procedure(s) Performed: OPEN REDUCTION INTERNAL FIXATION (ORIF) FOREARM FRACTURE (Left )  Patient Location: PACU  Anesthesia Type:MAC, General and Regional  Level of Consciousness: awake, alert  and oriented  Airway & Oxygen Therapy: Patient Spontanous Breathing and Patient connected to nasal cannula oxygen  Post-op Assessment: Report given to RN and Post -op Vital signs reviewed and stable  Post vital signs: Reviewed and stable  Last Vitals:  Vitals Value Taken Time  BP 121/69 12/13/19 1630  Temp    Pulse 74 12/13/19 1632  Resp 14 12/13/19 1632  SpO2 100 % 12/13/19 1632  Vitals shown include unvalidated device data.  Last Pain:  Vitals:   12/13/19 1411  TempSrc:   PainSc: 0-No pain      Patients Stated Pain Goal: 2 (31/49/70 2637)  Complications: No apparent anesthesia complications

## 2019-12-13 NOTE — Anesthesia Preprocedure Evaluation (Signed)
Anesthesia Evaluation  Patient identified by MRN, date of birth, ID band Patient awake    Reviewed: Allergy & Precautions, NPO status , Patient's Chart, lab work & pertinent test results  Airway Mallampati: II  TM Distance: >3 FB Neck ROM: Full    Dental  (+) Dental Advisory Given   Pulmonary asthma , sleep apnea , former smoker,    breath sounds clear to auscultation       Cardiovascular hypertension, Pt. on medications and Pt. on home beta blockers + CAD, + Past MI, + CABG and +CHF   Rhythm:Regular Rate:Normal     Neuro/Psych  Headaches,  Neuromuscular disease    GI/Hepatic Neg liver ROS, GERD  ,  Endo/Other  diabetesHypothyroidism   Renal/GU Renal disease     Musculoskeletal  (+) Arthritis ,   Abdominal   Peds  Hematology  (+) anemia ,   Anesthesia Other Findings   Reproductive/Obstetrics                             Lab Results  Component Value Date   WBC 6.8 12/12/2019   HGB 10.5 (L) 12/12/2019   HCT 33.5 (L) 12/12/2019   MCV 94.9 12/12/2019   PLT 244 12/12/2019   Lab Results  Component Value Date   CREATININE 1.04 (H) 12/12/2019   BUN 29 (H) 12/12/2019   NA 141 12/12/2019   K 3.8 12/12/2019   CL 104 12/12/2019   CO2 26 12/12/2019    Anesthesia Physical Anesthesia Plan  ASA: III  Anesthesia Plan: MAC and Regional   Post-op Pain Management:    Induction:   PONV Risk Score and Plan: 2 and Propofol infusion, Ondansetron and Treatment may vary due to age or medical condition  Airway Management Planned: Natural Airway and Simple Face Mask  Additional Equipment:   Intra-op Plan:   Post-operative Plan:   Informed Consent: I have reviewed the patients History and Physical, chart, labs and discussed the procedure including the risks, benefits and alternatives for the proposed anesthesia with the patient or authorized representative who has indicated his/her  understanding and acceptance.     Dental advisory given  Plan Discussed with: CRNA  Anesthesia Plan Comments:         Anesthesia Quick Evaluation

## 2019-12-13 NOTE — Progress Notes (Signed)
Patient was seen and examined today Patient does have the both bone forearm fracture Plan is for open reduction internal fixation of the radius and ulnar shafts. Outpatient surgical decision-making carried out. Patient would like to move forward  R/B/A [place].  PT VOICED UNDERSTANDING OF PLAN CONSENT SIGNED DAY OF SURGERY PT SEEN AND EXAMINED PRIOR TO OPERATIVE PROCEDURE/DAY OF SURGERY SITE MARKED. QUESTIONS ANSWERED WILL REMAIN AN INPATIENT FOLLOWING SURGERY  WE ARE PLANNING SURGERY FOR YOUR UPPER EXTREMITY. THE RISKS AND BENEFITS OF SURGERY INCLUDE BUT NOT LIMITED TO BLEEDING INFECTION, DAMAGE TO NEARBY NERVES ARTERIES TENDONS, FAILURE OF SURGERY TO ACCOMPLISH ITS INTENDED GOALS, PERSISTENT SYMPTOMS AND NEED FOR FURTHER SURGICAL INTERVENTION. WITH THIS IN MIND WE WILL PROCEED. I HAVE DISCUSSED WITH THE PATIENT THE PRE AND POSTOPERATIVE REGIMEN AND THE DOS AND DON'TS. PT VOICED UNDERSTANDING AND INFORMED CONSENT SIGNED.

## 2019-12-13 NOTE — Anesthesia Procedure Notes (Signed)
Procedure Name: LMA Insertion Date/Time: 12/13/2019 2:42 PM Performed by: Clearnce Sorrel, CRNA Pre-anesthesia Checklist: Patient identified, Emergency Drugs available, Suction available, Patient being monitored and Timeout performed Patient Re-evaluated:Patient Re-evaluated prior to induction Oxygen Delivery Method: Circle system utilized Preoxygenation: Pre-oxygenation with 100% oxygen Induction Type: IV induction LMA: LMA inserted LMA Size: 4.0 Placement Confirmation: positive ETCO2 and breath sounds checked- equal and bilateral Tube secured with: Tape Dental Injury: Teeth and Oropharynx as per pre-operative assessment

## 2019-12-13 NOTE — Op Note (Signed)
PREOPERATIVE DIAGNOSIS: Left radial and ulnar shaft fracture, displaced both bone forearm fracture  POSTOPERATIVE DIAGNOSIS: Same  ATTENDING SURGEON: Dr. Iran Planas who scrubbed and present for the entire procedure  ASSISTANT SURGEON: None  ANESTHESIA: General via LMA with regional block  OPERATIVE PROCEDURE: #1: Open treatment of displaced left radius and ulnar shaft fracture requiring internal fixation #2: Radiographs 3 views left forearm  IMPLANTS: Zimmer stainless steel 3.5 mm 6-hole plate for the radius and 2.7 mm 6-hole plate for the ulna  RADIOGRAPHIC INTERPRETATION: AP lateral oblique views of the forearm do show the dorsal and volar plate fixation of the radius and ulna with good alignment of the radial ulnar shafts  SURGICAL INDICATIONS: Patient is a right-hand-dominant female who sustained the both bone forearm fracture after fall.  Patient was seen and evaluated and recommended undergo the above procedure.  The risks of surgery include but not limited to bleeding infection damage nearby nerves arteries or tendons nonunion malunion hardware failure loss of motion of the wrist and digits incomplete relief of symptoms and need for further surgical invention.  SURGICAL TECHNIQUE: Patient was palpated find the preoperative holding area and properly marked.  Preoperative antibiotics were given.  Patient brought back operating placed supine on the anesthesia table where the general anesthetic was administered.  Patient tolerated this well.  A well-padded tourniquet was then placed on the left brachium and seal with the appropriate drape.  Left upper extremities then prepped and draped normal sterile fashion.  A timeout was called the correct site was identified procedure then begun.  Attention then turned to the left forearm.  Longitudinal incision made directly over the radial shaft.  Dissection carried down through skin and subcutaneous tissue.  Going through the interval between the  brachioradialis and the FCR blunt dissection was carried down to the fracture site.  Open reduction was then performed.  Following this fracture hematoma was then evacuated and open reduction was then done with reduction clamps.  Following this the 6-hole plate was then contoured with the bending irons and applied along the volar surface of the radial shaft.  Following this compression plating technique was then carried out with combination of 3 5 nonlocking screws 3 screws proximally 3 screws distally total of 6 cortices.  The wound was then thoroughly irrigated radiographs were then obtained.  Subcutaneous tissues were then closed with Vicryl skin closed with simple Prolene sutures.  Through separate incision attention was then turned to the left ulnar shaft.  Dissection carried down through the skin and subcutaneous tissue.  The ulnar shaft was then exposed to the interval between the ECU and the FCU fascia.  Fracture site was then exposed.  Open reduction was then performed and held in place with reduction clamps.  Compression plating technique was then used with a 2.7 mm 6-hole plate.  3 screws proximally 3 screws distally combination of locking and nonlocking screws with appropriate drill bit depth gauge measurement.  Final radiographs were then obtained.  The wound was then thoroughly irrigated.  The fascia layer was then closed with Vicryl.  Subcutaneous tissues closed with 4-0 Vicryl and the skin closed with simple Prolene sutures.  Adaptic dressing sterile compressive bandage then applied.  The patient then placed in a well-padded sugar tong splint extubated taken recovery in good condition.  POSTOPERATIVE PLAN: Patient be discharged back to the hospitalist service.  Patient be nonweightbearing in the left upper extremity no weightbearing through the elbow or forearm.  Ice elevation activity and mobility  of the fingers.  See her back in 2 weeks.  2 weeks would likely transition her into a fracture brace  may consider allowing her to put a little weight through the forearm.  She will need to follow-up in the office in 2 weeks.  Please contact me directly if there is any questions.  JY:[id number]

## 2019-12-14 LAB — GLUCOSE, CAPILLARY
Glucose-Capillary: 194 mg/dL — ABNORMAL HIGH (ref 70–99)
Glucose-Capillary: 157 mg/dL — ABNORMAL HIGH (ref 70–99)
Glucose-Capillary: 128 mg/dL — ABNORMAL HIGH (ref 70–99)
Glucose-Capillary: 145 mg/dL — ABNORMAL HIGH (ref 70–99)

## 2019-12-14 MED ORDER — CLOPIDOGREL BISULFATE 75 MG PO TABS
75.0000 mg | ORAL_TABLET | Freq: Every day | ORAL | Status: DC
  Administered 2019-12-14 – 2019-12-18 (×5): 75 mg via ORAL
  Filled 2019-12-14 (×5): qty 1

## 2019-12-14 NOTE — Plan of Care (Signed)

## 2019-12-14 NOTE — Progress Notes (Signed)
PROGRESS NOTE    Megan [name]  [id number] DOB: 07/19/1946 DOA: 12/11/2019 PCP: [name], MD    Brief Narrative:  74 year old female with history of coronary artery disease status post CABG in September [id number], chronic diastolic heart failure, hypertension, type 2 diabetes, chronic kidney disease stage III AAA, hypothyroidism and hyperlipidemia, anxiety and sleep apnea, multiple recent fractures.  She walks with a walker at home.  Presented to the ER with left arm pain after falling at home. In the emergency room, hemodynamically stable.  Skeletal survey negative except left forearm fractures.  Patient unable to ambulate and unsafe to go home.  Admitted for inpatient treatment, surgical fixation.   Assessment & Plan:   Principal Problem:   Closed fracture of left radius and ulna Active Problems:   Chronic diastolic CHF (congestive heart failure) (HCC)   DM (diabetes mellitus), type 2 with complications (HCC)   CKD (chronic kidney disease) stage 3, GFR 30-59 ml/min   Hypothyroidism   S/P CABG x 2   CAD (coronary artery disease)   Hyperlipidemia associated with type 2 diabetes mellitus ([place])   Hypertension associated with diabetes ([place])   Fracture closed of upper end of forearm  Closed fracture of left radius and ulna: Patient underwent ORIF 4/16. Post surgical management as per surgery.  Adequate pain medications.  Nonweightbearing left wrist and elbow. With recent multiple fractures and medical issues, she is deconditioned and will need rehab.  Refer to inpatient physical therapy at the skilled nursing rehab.  Coronary artery disease status post CABG: Denies any chest pain.  Plavix was on hold.  Will resume today.  Currently remains on metoprolol and atorvastatin.  Chronic diastolic dysfunction: Euvolemic and well compensated.  On maintenance Lasix that is continued.  Type 2 diabetes: On Metformin at home.  Remains on sliding scale insulin.  Hypertension: On home  medications including metoprolol, losartan, spironolactone and Lasix.  Hypothyroidism: On Synthroid.  DVT prophylaxis: SCDs  Code Status: Full code Family Communication: None, patient communicating with family. Disposition Plan:  Status is: Inpatient  Remains inpatient appropriate because:Unsafe d/c plan   Dispo: The patient is from: Home              Anticipated d/c is to: SNF              Anticipated d/c date is: 2 days              Patient currently is medically stable to d/c.    Consultants:   Orthopedics  Procedures:   Going for surgery today  Antimicrobials:   None   Subjective: No overnight events.  Numbness on the fingers due to anesthesia.  Objective: Vitals:   12/13/19 1828 12/13/19 2029 12/14/19 0154 12/14/19 0529  BP: (!) 117/56 (!) 109/50 118/61 (!) 115/59  Pulse: 66 75 68 68  Resp: 16 18 16 18   Temp: 98.1 F (36.7 C) (!) 97.4 F (36.3 C) 98.4 F (36.9 C) 97.9 F (36.6 C)  TempSrc: Oral Oral Oral Oral  SpO2: 94% 97% 96% 96%  Weight:   77.2 kg     Intake/Output Summary (Last 24 hours) at 12/14/2019 1200 Last data filed at 12/14/2019 1013 Gross per 24 hour  Intake 1700 ml  Output 1425 ml  Net 275 ml   Filed Weights   12/14/19 0154  Weight: 77.2 kg    Examination:  General exam: Appears calm and comfortable  Respiratory system: Clear to auscultation. Respiratory effort normal. Cardiovascular system: S1 & S2  heard, RRR.  Central nervous system: Alert and oriented. No focal neurological deficits. Extremities: Symmetric 5 x 5 power. Skin: No rashes, lesions or ulcers Psychiatry: Judgement and insight appear normal. Mood & affect appropriate.  Left forearm on postop cast . Distal vascularity intact. Loss of superficial sensation along the ulnar distribution.   Data Reviewed: I have personally reviewed following labs and imaging studies  CBC: Recent Labs  Lab 12/11/19 2117 12/12/19 0349  WBC 8.1 6.8  NEUTROABS 6.2  --   HGB  12.2 10.5*  HCT 40.4 33.5*  MCV 98.3 94.9  PLT 260 [id number]   Basic Metabolic Panel: Recent Labs  Lab 12/11/19 2117 12/12/19 0349  NA 141 141  K 4.3 3.8  CL 102 104  CO2 25 26  GLUCOSE 139* 151*  BUN 28* 29*  CREATININE 0.99 1.04*  CALCIUM 9.8 9.2   GFR: Estimated Creatinine Clearance: 46.2 mL/min (A) (by C-G formula based on SCr of 1.04 mg/dL (H)). Liver Function Tests: No results for input(s): AST, ALT, ALKPHOS, BILITOT, PROT, ALBUMIN in the last 168 hours. No results for input(s): LIPASE, AMYLASE in the last 168 hours. No results for input(s): AMMONIA in the last 168 hours. Coagulation Profile: No results for input(s): INR, PROTIME in the last 168 hours. Cardiac Enzymes: No results for input(s): CKTOTAL, CKMB, CKMBINDEX, TROPONINI in the last 168 hours. BNP (last 3 results) No results for input(s): PROBNP in the last 8760 hours. HbA1C: Recent Labs    12/11/19 2113  HGBA1C 6.0*   CBG: Recent Labs  Lab 12/13/19 0803 12/13/19 1215 12/13/19 1656 12/13/19 2024 12/14/19 0800  GLUCAP 105* 117* 136* 256* 194*   Lipid Profile: No results for input(s): CHOL, HDL, LDLCALC, TRIG, CHOLHDL, LDLDIRECT in the last 72 hours. Thyroid Function Tests: No results for input(s): TSH, T4TOTAL, FREET4, T3FREE, THYROIDAB in the last 72 hours. Anemia Panel: No results for input(s): VITAMINB12, FOLATE, FERRITIN, TIBC, IRON, RETICCTPCT in the last 72 hours. Sepsis Labs: No results for input(s): PROCALCITON, LATICACIDVEN in the last 168 hours.  Recent Results (from the past 240 hour(s))  SARS CORONAVIRUS 2 (TAT 6-24 HRS) Nasopharyngeal Nasopharyngeal Swab     Status: None   Collection Time: 12/11/19 11:18 PM   Specimen: Nasopharyngeal Swab  Result Value Ref Range Status   SARS Coronavirus 2 NEGATIVE NEGATIVE Final    Comment: (NOTE) SARS-CoV-2 target nucleic acids are NOT DETECTED. The SARS-CoV-2 RNA is generally detectable in upper and lower respiratory specimens during the acute  phase of infection. Negative results do not preclude SARS-CoV-2 infection, do not rule out co-infections with other pathogens, and should not be used as the sole basis for treatment or other patient management decisions. Negative results must be combined with clinical observations, patient history, and epidemiological information. The expected result is Negative. Fact Sheet for Patients: SugarRoll.be Fact Sheet for Healthcare Providers: https://www.woods-mathews.com/ This test is not yet approved or cleared by the [place] FDA and  has been authorized for detection and/or diagnosis of SARS-CoV-2 by FDA under an Emergency Use Authorization (EUA). This EUA will remain  in effect (meaning this test can be used) for the duration of the COVID-19 declaration under Section 56 4(b)(1) of the Act, 21 U.S.C. section 360bbb-3(b)(1), unless the authorization is terminated or revoked sooner. Performed at [facility], [place] [address] [place], [place] [address]   Surgical pcr screen     Status: Abnormal   Collection Time: 12/12/19  9:08 PM   Specimen: Nasal Mucosa; Nasal Swab  Result Value Ref Range Status   MRSA, PCR POSITIVE (A) NEGATIVE Final    Comment: CRITICAL RESULT CALLED TO, READ BACK BY AND VERIFIED WITH: RN MARIA RASING [id number] @2334  THANEY    Staphylococcus aureus POSITIVE (A) NEGATIVE Final    Comment: CRITICAL RESULT CALLED TO, READ BACK BY AND VERIFIED WITH: RN Lupe Carney [id number] @2334  THANEY Performed at [facility], [place] [address] [place], [place] [address]          Radiology Studies: No results found.      Scheduled Meds: . atorvastatin  80 mg Oral q1800  . Chlorhexidine Gluconate Cloth  6 each Topical Q0600  . famotidine  20 mg Oral BID  . furosemide  40 mg Oral q1800  . furosemide  80 mg Oral Q24H  . insulin aspart  0-9 Units Subcutaneous TID WC  . levothyroxine  75 mcg Oral QAC breakfast  .  losartan  12.5 mg Oral Daily  . melatonin  3 mg Oral QHS  . metoprolol succinate  25 mg Oral Daily  . mupirocin ointment  1 application Nasal BID  . spironolactone  12.5 mg Oral QHS   Continuous Infusions: . lactated ringers 10 mL/hr at 12/13/19 1257     LOS: 2 days    Time spent: 25 minutes    Barb Merino, MD [facility] Pager [phone]

## 2019-12-14 NOTE — TOC Progression Note (Signed)
Transition of Care [facility]) - Progression Note    Patient Details  Name: Megan [name] MRN: [id number] Date of Birth: [birth year]  Transition of Care [facility]) CM/SW [place], [place] Phone Number: 12/14/2019, 9:21 AM  Clinical Narrative:    Pt clinicals sent through SNF hub, preference for [facility]. Will start insurance authorization.    Expected Discharge Plan: [place] Barriers to Discharge: Continued Medical Work up, Ship broker  Expected Discharge Plan and Services Expected Discharge Plan: [place] In-house Referral: Clinical Social Work Discharge Planning Services: [employer] Consult Post Acute Care Choice: [place], Durable Medical Equipment Living arrangements for the past 2 months: Single Family Home   Readmission Risk Interventions No flowsheet data found.

## 2019-12-15 DIAGNOSIS — N1831 Chronic kidney disease, stage 3a: Secondary | ICD-10-CM

## 2019-12-15 DIAGNOSIS — E669 Obesity, unspecified: Secondary | ICD-10-CM | POA: Diagnosis present

## 2019-12-15 DIAGNOSIS — E118 Type 2 diabetes mellitus with unspecified complications: Secondary | ICD-10-CM

## 2019-12-15 DIAGNOSIS — I5032 Chronic diastolic (congestive) heart failure: Secondary | ICD-10-CM

## 2019-12-15 LAB — GLUCOSE, CAPILLARY
Glucose-Capillary: 105 mg/dL — ABNORMAL HIGH (ref 70–99)
Glucose-Capillary: 118 mg/dL — ABNORMAL HIGH (ref 70–99)
Glucose-Capillary: 131 mg/dL — ABNORMAL HIGH (ref 70–99)
Glucose-Capillary: 160 mg/dL — ABNORMAL HIGH (ref 70–99)

## 2019-12-15 MED ORDER — OXYCODONE HCL 5 MG PO TABS
5.0000 mg | ORAL_TABLET | ORAL | Status: DC | PRN
  Administered 2019-12-15 – 2019-12-17 (×5): 5 mg via ORAL
  Filled 2019-12-15 (×5): qty 1

## 2019-12-15 MED ORDER — TRAMADOL HCL 50 MG PO TABS
50.0000 mg | ORAL_TABLET | Freq: Four times a day (QID) | ORAL | Status: DC | PRN
  Administered 2019-12-16: 50 mg via ORAL
  Filled 2019-12-15: qty 1

## 2019-12-15 MED ORDER — METHOCARBAMOL 500 MG PO TABS
500.0000 mg | ORAL_TABLET | Freq: Once | ORAL | Status: AC
  Administered 2019-12-15: 500 mg via ORAL
  Filled 2019-12-15: qty 1

## 2019-12-15 NOTE — Plan of Care (Signed)

## 2019-12-15 NOTE — Plan of Care (Signed)
  Problem: Education: Goal: Knowledge of General Education information will improve Description: Including pain rating scale, medication(s)/side effects and non-pharmacologic comfort measures 12/15/2019 2058 by Guinevere Scarlet, RN Outcome: Adequate for Discharge 12/15/2019 2057 by Guinevere Scarlet, RN Outcome: Adequate for Discharge   Problem: Clinical Measurements: Goal: Ability to maintain clinical measurements within normal limits will improve 12/15/2019 2058 by Guinevere Scarlet, RN Outcome: Adequate for Discharge 12/15/2019 2057 by Guinevere Scarlet, RN Outcome: Adequate for Discharge Goal: Will remain free from infection Outcome: Adequate for Discharge Goal: Diagnostic test results will improve Outcome: Adequate for Discharge Goal: Respiratory complications will improve Outcome: Adequate for Discharge Goal: Cardiovascular complication will be avoided Outcome: Adequate for Discharge   Problem: Activity: Goal: Risk for activity intolerance will decrease Outcome: Adequate for Discharge   Problem: Nutrition: Goal: Adequate nutrition will be maintained Outcome: Adequate for Discharge   Problem: Coping: Goal: Level of anxiety will decrease Outcome: Adequate for Discharge   Problem: Elimination: Goal: Will not experience complications related to bowel motility Outcome: Adequate for Discharge Goal: Will not experience complications related to urinary retention Outcome: Adequate for Discharge   Problem: Pain Managment: Goal: General experience of comfort will improve Outcome: Adequate for Discharge   Problem: Safety: Goal: Ability to remain free from injury will improve Outcome: Adequate for Discharge   Problem: Skin Integrity: Goal: Risk for impaired skin integrity will decrease Outcome: Adequate for Discharge

## 2019-12-15 NOTE — Progress Notes (Addendum)
PROGRESS NOTE  Megan [name] [id number] DOB: [birth year] DOA: 12/11/2019 PCP: [name], MD  HPI/Recap of past 81 hours: 74 year old female with history of coronary artery disease status post CABG in September [id number], chronic diastolic heart failure, hypertension, type 2 diabetes, chronic kidney disease stage III AAA, hypothyroidism and hyperlipidemia, anxiety and sleep apnea, multiple recent fractures, who walks with a walker at home and presented to the ER on 4/14 with left arm pain after falling at home. Skeletal survey negative except left forearm fractures.  Patient unable to ambulate and unsafe to go home.    Admitted to the hospitalist service with orthopedic consultation.  Orthopedic surgery took patient to the OR on 4/16 and patient underwent ORIF of displaced left radius and ulnar shaft fracture.  Patient complains of a pulling left pain approximately 3/10 that is going on since 7:00 this morning near her left breast, worse when she leans forward.  Also complains of intermittent spasms in her left forearm  Assessment/Plan: Principal Problem:   Closed fracture of left radius and ulna status post ORIF: Waiting for evaluation by PT/OT.  May end up needing short-term skilled nursing.  Appreciate orthopedic assistance. Active Problems:   Chronic diastolic CHF (congestive heart failure) ([name]): Monitoring volumes.  Currently euvolemic.  Check BNP in the morning  Left-sided chest pain: No evidence of any cardiac dysfunction.  Appears more musculoskeletal.  May be secondary to wearing a sling and pulling wearing her sling and muscle wall tenderness.  Ordered muscle relaxer    DM (diabetes mellitus), type 2 with complications ([place]): CBG stable.    CKD (chronic kidney disease) stage 3, GFR 30-59 ml/min: Appears stable.  Recheck labs in the morning.    Hypothyroidism: Continue Synthroid.    S/P CABG x 2   CAD (coronary artery disease)   Hyperlipidemia associated with type 2  diabetes mellitus ([name])   Hypertension associated with diabetes ([name]): Blood pressure stable.    Obesity (BMI 30-39.9): Meets criteria with BMI greater than 30.   Code Status: Full code  Family Communication: Left message for sister  Disposition Plan: Awaiting insurance approval for collapse skilled nursing.  Consultants:  Orthopedics  Procedures:  Status post ORIF done 4/16  Antimicrobials:  Preop Ancef  DVT prophylaxis: SCDs   Objective: Vitals:   12/14/19 2153 12/15/19 0413  BP: (!) 103/48 (!) 103/47  Pulse: 76 63  Resp: 18 18  Temp: 98.6 F (37 C) 97.6 F (36.4 C)  SpO2: 97% 96%    Intake/Output Summary (Last 24 hours) at 12/15/2019 1421 Last data filed at 12/15/2019 0900 Gross per 24 hour  Intake 477 ml  Output 750 ml  Net -273 ml   Filed Weights   12/14/19 0154  Weight: 77.2 kg   Body mass index is 30.63 kg/m.  Exam:   General: Alert and oriented x3, no acute distress  HEENT: Normocephalic and atraumatic, mucous membranes are moist  Cardiovascular: Regular rate and rhythm, S1-S2, 2 out of 6 systolic ejection murmur  Respiratory: Clear to auscultation bilaterally  Abdomen: Soft, nontender, nondistended, positive bowel sounds  Musculoskeletal: No clubbing or cyanosis or edema.  Left forearm wrapped, left upper extremity in sling  Skin: No skin breaks, tears or lesions  Psychiatry: Appropriate, no evidence of psychoses   Data Reviewed: CBC: Recent Labs  Lab 12/11/19 2117 12/12/19 0349  WBC 8.1 6.8  NEUTROABS 6.2  --   HGB 12.2 10.5*  HCT 40.4 33.5*  MCV 98.3 94.9  PLT 260 244  Basic Metabolic Panel: Recent Labs  Lab 12/11/19 2117 12/12/19 0349  NA 141 141  K 4.3 3.8  CL 102 104  CO2 25 26  GLUCOSE 139* 151*  BUN 28* 29*  CREATININE 0.99 1.04*  CALCIUM 9.8 9.2   GFR: Estimated Creatinine Clearance: 46.2 mL/min (A) (by C-G formula based on SCr of 1.04 mg/dL (H)). Liver Function Tests: No results for input(s): AST,  ALT, ALKPHOS, BILITOT, PROT, ALBUMIN in the last 168 hours. No results for input(s): LIPASE, AMYLASE in the last 168 hours. No results for input(s): AMMONIA in the last 168 hours. Coagulation Profile: No results for input(s): INR, PROTIME in the last 168 hours. Cardiac Enzymes: No results for input(s): CKTOTAL, CKMB, CKMBINDEX, TROPONINI in the last 168 hours. BNP (last 3 results) No results for input(s): PROBNP in the last 8760 hours. HbA1C: No results for input(s): HGBA1C in the last 72 hours. CBG: Recent Labs  Lab 12/14/19 1219 12/14/19 1653 12/14/19 2242 12/15/19 0744 12/15/19 1201  GLUCAP 157* 128* 145* 105* 118*   Lipid Profile: No results for input(s): CHOL, HDL, LDLCALC, TRIG, CHOLHDL, LDLDIRECT in the last 72 hours. Thyroid Function Tests: No results for input(s): TSH, T4TOTAL, FREET4, T3FREE, THYROIDAB in the last 72 hours. Anemia Panel: No results for input(s): VITAMINB12, FOLATE, FERRITIN, TIBC, IRON, RETICCTPCT in the last 72 hours. Urine analysis:    Component Value Date/Time   COLORURINE YELLOW 12/12/2019 0045   APPEARANCEUR CLEAR 12/12/2019 0045   LABSPEC 1.016 12/12/2019 0045   PHURINE 5.0 12/12/2019 0045   GLUCOSEU NEGATIVE 12/12/2019 0045   HGBUR NEGATIVE 12/12/2019 0045   BILIRUBINUR NEGATIVE 12/12/2019 0045   KETONESUR NEGATIVE 12/12/2019 0045   PROTEINUR NEGATIVE 12/12/2019 0045   UROBILINOGEN Normal 01/19/2019 0000   NITRITE NEGATIVE 12/12/2019 0045   LEUKOCYTESUR TRACE (A) 12/12/2019 0045   Sepsis Labs: @LABRCNTIP (procalcitonin:4,lacticidven:4)  ) Recent Results (from the past 240 hour(s))  SARS CORONAVIRUS 2 (TAT 6-24 HRS) Nasopharyngeal Nasopharyngeal Swab     Status: None   Collection Time: 12/11/19 11:18 PM   Specimen: Nasopharyngeal Swab  Result Value Ref Range Status   SARS Coronavirus 2 NEGATIVE NEGATIVE Final    Comment: (NOTE) SARS-CoV-2 target nucleic acids are NOT DETECTED. The SARS-CoV-2 RNA is generally detectable in upper  and lower respiratory specimens during the acute phase of infection. Negative results do not preclude SARS-CoV-2 infection, do not rule out co-infections with other pathogens, and should not be used as the sole basis for treatment or other patient management decisions. Negative results must be combined with clinical observations, patient history, and epidemiological information. The expected result is Negative. Fact Sheet for Patients: SugarRoll.be Fact Sheet for Healthcare Providers: https://www.woods-mathews.com/ This test is not yet approved or cleared by the [place] FDA and  has been authorized for detection and/or diagnosis of SARS-CoV-2 by FDA under an Emergency Use Authorization (EUA). This EUA will remain  in effect (meaning this test can be used) for the duration of the COVID-19 declaration under Section 56 4(b)(1) of the Act, 21 U.S.C. section 360bbb-3(b)(1), unless the authorization is terminated or revoked sooner. Performed at [facility], [place] [address], [place], [place] [address]   Surgical pcr screen     Status: Abnormal   Collection Time: 12/12/19  9:08 PM   Specimen: Nasal Mucosa; Nasal Swab  Result Value Ref Range Status   MRSA, PCR POSITIVE (A) NEGATIVE Final    Comment: CRITICAL RESULT CALLED TO, READ BACK BY AND VERIFIED WITH: RN Lupe Carney [id number] @  2334 THANEY    Staphylococcus aureus POSITIVE (A) NEGATIVE Final    Comment: CRITICAL RESULT CALLED TO, READ BACK BY AND VERIFIED WITH: RN Lupe Carney [id number] @2334  THANEY Performed at [facility], [place] [address], [place], [place] [address]       Studies: No results found.  Scheduled Meds: . atorvastatin  80 mg Oral q1800  . Chlorhexidine Gluconate Cloth  6 each Topical Q0600  . clopidogrel  75 mg Oral Daily  . famotidine  20 mg Oral BID  . furosemide  40 mg Oral q1800  . furosemide  80 mg Oral Q24H  . insulin aspart  0-9 Units  Subcutaneous TID WC  . levothyroxine  75 mcg Oral QAC breakfast  . losartan  12.5 mg Oral Daily  . melatonin  3 mg Oral QHS  . methocarbamol  500 mg Oral Once  . metoprolol succinate  25 mg Oral Daily  . mupirocin ointment  1 application Nasal BID  . spironolactone  12.5 mg Oral QHS    Continuous Infusions: . lactated ringers 10 mL/hr at 12/13/19 1257     LOS: 3 days     Annita Brod, MD [facility]   12/15/2019, 2:21 PM

## 2019-12-16 ENCOUNTER — Encounter: Payer: Self-pay | Admitting: *Deleted

## 2019-12-16 LAB — CBC
HCT: 31.8 % — ABNORMAL LOW (ref 36.0–46.0)
Hemoglobin: 10.3 g/dL — ABNORMAL LOW (ref 12.0–15.0)
MCH: 30.6 pg (ref 26.0–34.0)
MCHC: 32.4 g/dL (ref 30.0–36.0)
MCV: 94.4 fL (ref 80.0–100.0)
Platelets: 259 10*3/uL (ref 150–400)
RBC: 3.37 MIL/uL — ABNORMAL LOW (ref 3.87–5.11)
RDW: 13.7 % (ref 11.5–15.5)
WBC: 8.3 10*3/uL (ref 4.0–10.5)
nRBC: 0 % (ref 0.0–0.2)

## 2019-12-16 LAB — BASIC METABOLIC PANEL
Anion gap: 10 (ref 5–15)
BUN: 44 mg/dL — ABNORMAL HIGH (ref 8–23)
CO2: 28 mmol/L (ref 22–32)
Calcium: 8.9 mg/dL (ref 8.9–10.3)
Chloride: 99 mmol/L (ref 98–111)
Creatinine, Ser: 1.13 mg/dL — ABNORMAL HIGH (ref 0.44–1.00)
GFR calc Af Amer: 55 mL/min — ABNORMAL LOW (ref >60)
GFR calc non Af Amer: 48 mL/min — ABNORMAL LOW (ref >60)
Glucose, Bld: 152 mg/dL — ABNORMAL HIGH (ref 70–99)
Potassium: 3.9 mmol/L (ref 3.5–5.1)
Sodium: 137 mmol/L (ref 135–145)

## 2019-12-16 LAB — GLUCOSE, CAPILLARY
Glucose-Capillary: 150 mg/dL — ABNORMAL HIGH (ref 70–99)
Glucose-Capillary: 133 mg/dL — ABNORMAL HIGH (ref 70–99)
Glucose-Capillary: 111 mg/dL — ABNORMAL HIGH (ref 70–99)
Glucose-Capillary: 196 mg/dL — ABNORMAL HIGH (ref 70–99)

## 2019-12-16 LAB — SARS CORONAVIRUS 2 (TAT 6-24 HRS): SARS Coronavirus 2: NEGATIVE

## 2019-12-16 LAB — BRAIN NATRIURETIC PEPTIDE: B Natriuretic Peptide: 173.6 pg/mL — ABNORMAL HIGH (ref 0.0–100.0)

## 2019-12-16 MED ORDER — POLYETHYLENE GLYCOL 3350 17 G PO PACK
17.0000 g | PACK | Freq: Every day | ORAL | Status: DC
  Administered 2019-12-16 – 2019-12-17 (×2): 17 g via ORAL
  Filled 2019-12-16 (×3): qty 1

## 2019-12-16 MED ORDER — SENNOSIDES-DOCUSATE SODIUM 8.6-50 MG PO TABS
1.0000 | ORAL_TABLET | Freq: Every day | ORAL | Status: DC
  Administered 2019-12-16 – 2019-12-17 (×2): 1 via ORAL
  Filled 2019-12-16 (×2): qty 1

## 2019-12-16 NOTE — Progress Notes (Signed)
PROGRESS NOTE    Megan [name]  [id number] DOB: September 01, 1945 DOA: 12/11/2019 PCP: [name], MD    Brief Narrative:  74 year old female with history of coronary artery disease status post CABG in September [id number], chronic diastolic heart failure, hypertension, type 2 diabetes, chronic kidney disease stage III AAA, hypothyroidism and hyperlipidemia, anxiety and sleep apnea, multiple recent fractures.  She walks with a walker at home.  Presented to the ER with left arm pain after falling at home. In the emergency room, hemodynamically stable.  Skeletal survey negative except left forearm fractures.  Patient unable to ambulate and unsafe to go home.  Admitted for inpatient treatment, surgical fixation.   Assessment & Plan:   Principal Problem:   Closed fracture of left radius and ulna Active Problems:   Chronic diastolic CHF (congestive heart failure) (HCC)   DM (diabetes mellitus), type 2 with complications (HCC)   CKD (chronic kidney disease) stage 3, GFR 30-59 ml/min   Hypothyroidism   S/P CABG x 2   CAD (coronary artery disease)   Hyperlipidemia associated with type 2 diabetes mellitus ([place])   Hypertension associated with diabetes ([place])   Fracture closed of upper end of forearm   Obesity (BMI 30-39.9)  Closed fracture of left radius and ulna: Patient underwent ORIF 4/16. Post surgical management as per surgery.  Adequate pain medications.  Nonweightbearing left wrist and elbow. With recent multiple fractures and medical issues, she is deconditioned and will need rehab.  Refer to inpatient physical therapy at the skilled nursing rehab.  Coronary artery disease status post CABG: Denies any chest pain.  Plavix was resumed.  Chronic diastolic dysfunction: Euvolemic and well compensated.  On maintenance Lasix that is continued.  Type 2 diabetes: On Metformin at home.  Remains on sliding scale insulin.  Will resume Metformin on discharge.  Hypertension: On home medications  including metoprolol, losartan, spironolactone and Lasix.  Hypothyroidism: On Synthroid.  DVT prophylaxis: SCDs  Code Status: Full code Family Communication: None, patient communicating with family. Disposition Plan:  Status is: Inpatient  Remains inpatient appropriate because:Unsafe d/c plan   Dispo: The patient is from: Home              Anticipated d/c is to: SNF              Anticipated d/c date is: 1 days              Patient currently is medically stable to d/c.    Consultants:   Orthopedics  Procedures:   ORIF left forarm.  Antimicrobials:   None   Subjective: No overnight events.  Pain improved on the left fingers. Complains of some left lateral chest wall pain with pressure sensation.  Objective: Vitals:   12/15/19 1510 12/15/19 2025 12/16/19 0435 12/16/19 0624  BP: (!) 118/59 (!) 113/53 (!) 124/51   Pulse: 71 83 72   Resp: 18 17 18    Temp: 98.1 F (36.7 C) 98.9 F (37.2 C) 98.7 F (37.1 C)   TempSrc: Oral Oral Oral   SpO2: 100% 96% 96%   Weight:    79.7 kg    Intake/Output Summary (Last 24 hours) at 12/16/2019 1352 Last data filed at 12/16/2019 0908 Gross per 24 hour  Intake 240 ml  Output 2400 ml  Net -2160 ml   Filed Weights   12/14/19 0154 12/16/19 0624  Weight: 77.2 kg 79.7 kg    Examination:  General exam: Appears calm and comfortable  Respiratory system: Clear to auscultation.  Respiratory effort normal. Cardiovascular system: S1 & S2 heard, RRR.  Central nervous system: Alert and oriented. No focal neurological deficits. Extremities: Symmetric 5 x 5 power. Skin: No rashes, lesions or ulcers Psychiatry: Judgement and insight appear normal. Mood & affect appropriate.  Left forearm on postop cast . Distal vascularity intact.   Data Reviewed: I have personally reviewed following labs and imaging studies  CBC: Recent Labs  Lab 12/11/19 2117 12/12/19 0349 12/16/19 0258  WBC 8.1 6.8 8.3  NEUTROABS 6.2  --   --   HGB 12.2  10.5* 10.3*  HCT 40.4 33.5* 31.8*  MCV 98.3 94.9 94.4  PLT 260 244 [id number]   Basic Metabolic Panel: Recent Labs  Lab 12/11/19 2117 12/12/19 0349 12/16/19 0258  NA 141 141 137  K 4.3 3.8 3.9  CL 102 104 99  CO2 25 26 28   GLUCOSE 139* 151* 152*  BUN 28* 29* 44*  CREATININE 0.99 1.04* 1.13*  CALCIUM 9.8 9.2 8.9   GFR: Estimated Creatinine Clearance: 43.2 mL/min (A) (by C-G formula based on SCr of 1.13 mg/dL (H)). Liver Function Tests: No results for input(s): AST, ALT, ALKPHOS, BILITOT, PROT, ALBUMIN in the last 168 hours. No results for input(s): LIPASE, AMYLASE in the last 168 hours. No results for input(s): AMMONIA in the last 168 hours. Coagulation Profile: No results for input(s): INR, PROTIME in the last 168 hours. Cardiac Enzymes: No results for input(s): CKTOTAL, CKMB, CKMBINDEX, TROPONINI in the last 168 hours. BNP (last 3 results) No results for input(s): PROBNP in the last 8760 hours. HbA1C: No results for input(s): HGBA1C in the last 72 hours. CBG: Recent Labs  Lab 12/15/19 1201 12/15/19 1641 12/15/19 2023 12/16/19 0737 12/16/19 1158  GLUCAP 118* 131* 160* 150* 133*   Lipid Profile: No results for input(s): CHOL, HDL, LDLCALC, TRIG, CHOLHDL, LDLDIRECT in the last 72 hours. Thyroid Function Tests: No results for input(s): TSH, T4TOTAL, FREET4, T3FREE, THYROIDAB in the last 72 hours. Anemia Panel: No results for input(s): VITAMINB12, FOLATE, FERRITIN, TIBC, IRON, RETICCTPCT in the last 72 hours. Sepsis Labs: No results for input(s): PROCALCITON, LATICACIDVEN in the last 168 hours.  Recent Results (from the past 240 hour(s))  SARS CORONAVIRUS 2 (TAT 6-24 HRS) Nasopharyngeal Nasopharyngeal Swab     Status: None   Collection Time: 12/11/19 11:18 PM   Specimen: Nasopharyngeal Swab  Result Value Ref Range Status   SARS Coronavirus 2 NEGATIVE NEGATIVE Final    Comment: (NOTE) SARS-CoV-2 target nucleic acids are NOT DETECTED. The SARS-CoV-2 RNA is generally  detectable in upper and lower respiratory specimens during the acute phase of infection. Negative results do not preclude SARS-CoV-2 infection, do not rule out co-infections with other pathogens, and should not be used as the sole basis for treatment or other patient management decisions. Negative results must be combined with clinical observations, patient history, and epidemiological information. The expected result is Negative. Fact Sheet for Patients: SugarRoll.be Fact Sheet for Healthcare Providers: https://www.woods-mathews.com/ This test is not yet approved or cleared by the [place] FDA and  has been authorized for detection and/or diagnosis of SARS-CoV-2 by FDA under an Emergency Use Authorization (EUA). This EUA will remain  in effect (meaning this test can be used) for the duration of the COVID-19 declaration under Section 56 4(b)(1) of the Act, 21 U.S.C. section 360bbb-3(b)(1), unless the authorization is terminated or revoked sooner. Performed at [facility], [place] [address], [place], [place] [address]   Surgical pcr screen  Status: Abnormal   Collection Time: 12/12/19  9:08 PM   Specimen: Nasal Mucosa; Nasal Swab  Result Value Ref Range Status   MRSA, PCR POSITIVE (A) NEGATIVE Final    Comment: CRITICAL RESULT CALLED TO, READ BACK BY AND VERIFIED WITH: RN MARIA RASING [id number] @2334  THANEY    Staphylococcus aureus POSITIVE (A) NEGATIVE Final    Comment: CRITICAL RESULT CALLED TO, READ BACK BY AND VERIFIED WITH: RN Lupe Carney [id number] @2334  THANEY Performed at [facility] [address] [place], [place] [address]          Radiology Studies: No results found.      Scheduled Meds: . atorvastatin  80 mg Oral q1800  . Chlorhexidine Gluconate Cloth  6 each Topical Q0600  . clopidogrel  75 mg Oral Daily  . famotidine  20 mg Oral BID  . furosemide  40 mg Oral q1800  . furosemide  80 mg Oral  Q24H  . insulin aspart  0-9 Units Subcutaneous TID WC  . levothyroxine  75 mcg Oral QAC breakfast  . losartan  12.5 mg Oral Daily  . melatonin  3 mg Oral QHS  . metoprolol succinate  25 mg Oral Daily  . mupirocin ointment  1 application Nasal BID  . polyethylene glycol  17 g Oral Daily  . senna-docusate  1 tablet Oral QHS  . spironolactone  12.5 mg Oral QHS   Continuous Infusions: . lactated ringers 10 mL/hr at 12/13/19 1257     LOS: 4 days    Time spent: 25 minutes    Barb Merino, MD [facility] Pager [phone]

## 2019-12-16 NOTE — Progress Notes (Addendum)
Occupational Therapy Re-eval  Patient Details Name: Megan [name] MRN: [id number] DOB: 06/14/1946 Today's Date: 12/16/2019    History of present illness 74 y.o. female with medical history significant for CAD s/p CABG September [id number], chronic diastolic CHF, hypertension, type 2 diabetes, CKD stage III, hypothyroidism, hyperlipidemia, anxiety, obesity, and OSA who presents to the ED for evaluation of left arm pain after a fall. Pt found to have acute fx of L radius and ulna, requiring non-emergent surgery. S/P ORIF L ulna/radius 12/13/19.    OT comments  Patient seen post ORIF 4/16 to L UE. Patient seated EOB upon entry, agreeable to OT/PT cotx.  Pt completing transfers and mobility with min assist +2 for  safety, grooming seated at sink with setup assist to comb hair.  Educated on exercises to complete with BUEs, L shoulder ROM and gentle finger movement to reduce edema in hand to tolerance; R UE shoulder flexion and abduction due to stiffness in shoulder.  Pt fearful of falling and fatigues quickly.  DC plans remains appropriate at SNF level.     Follow Up Recommendations  SNF;Supervision/Assistance - 24 hour    Equipment Recommendations  Other (comment)(TBD at next venue of care )    Recommendations for Other Services      Precautions / Restrictions Precautions Precautions: Fall Restrictions Weight Bearing Restrictions: Yes LUE Weight Bearing: Non weight bearing       Mobility Bed Mobility Overal bed mobility: Needs Assistance             General bed mobility comments: seated EOB upon entry  Transfers Overall transfer level: Needs assistance Equipment used: 1 person hand held assist Transfers: Sit to/from Stand Sit to Stand: Min assist;+2 safety/equipment         General transfer comment: cueing for hand placement, min assist to power up and steady, +2 present for safety    Balance Overall balance assessment: Needs assistance Sitting-balance support: Feet  supported;No upper extremity supported Sitting balance-Leahy Scale: Fair     Standing balance support: Single extremity supported;During functional activity Standing balance-Leahy Scale: Poor Standing balance comment: min guard to min assist                           ADL either performed or assessed with clinical judgement   ADL Overall ADL's : Needs assistance/impaired     Grooming: Set up;Sitting                   Toilet Transfer: Minimal assistance;+2 for safety/equipment;Ambulation Toilet Transfer Details (indicate cue type and reason): hand held assist          Functional mobility during ADLs: Minimal assistance;+2 for safety/equipment General ADL Comments: pt limited by decreased functional use of L UE, pain, activity tolerance and weakness      Vision   Vision Assessment?: No apparent visual deficits   Perception     Praxis      Cognition Arousal/Alertness: Awake/alert Behavior During Therapy: WFL for tasks assessed/performed Overall Cognitive Status: Within Functional Limits for tasks assessed                                          Exercises Exercises: Other exercises Other Exercises Other Exercises: x 5 reps R UE foward press and abduction at shoulder  Other Exercises: x 5 reps L UE shoudler elevation and  finger flexion/extension as tolerated    Shoulder Instructions       General Comments VSS    Pertinent Vitals/ Pain       Pain Assessment: Faces Faces Pain Scale: Hurts even more Pain Location: LUE Pain Descriptors / Indicators: Grimacing Pain Intervention(s): Limited activity within patient's tolerance;Monitored during session;Repositioned  Home Living                                          Prior Functioning/Environment              Frequency  Min 2X/week        Progress Toward Goals  OT Goals(current goals can now be found in the care plan section)  Progress towards OT  goals: Progressing toward goals  Acute Rehab OT Goals Patient Stated Goal: To go to rehab and get stronger OT Goal Formulation: With patient  Plan Discharge plan remains appropriate;Frequency remains appropriate    Co-evaluation    PT/OT/SLP Co-Evaluation/Treatment: Yes Reason for Co-Treatment: For patient/therapist safety;To address functional/ADL transfers   OT goals addressed during session: ADL's and self-care      AM-PAC OT "6 Clicks" Daily Activity     Outcome Measure   Help from another person eating meals?: A Little Help from another person taking care of personal grooming?: A Little Help from another person toileting, which includes using toliet, bedpan, or urinal?: Total Help from another person bathing (including washing, rinsing, drying)?: A Lot Help from another person to put on and taking off regular upper body clothing?: A Lot Help from another person to put on and taking off regular lower body clothing?: Total 6 Click Score: 12    End of Session Equipment Utilized During Treatment: Gait belt  OT Visit Diagnosis: Other abnormalities of gait and mobility (R26.89);Muscle weakness (generalized) (M62.81);Pain;History of falling (Z91.81) Pain - Right/Left: Left Pain - part of body: Arm;Hand   Activity Tolerance Patient tolerated treatment well   Patient Left in chair;with call bell/phone within reach;with chair alarm set   Nurse Communication Mobility status        Time: [id number] OT Time Calculation (min): 21 min  Charges: OT General Charges $OT Visit: 1 Visit OT Evaluation $OT Re-eval: 1 Re-eval  Jolaine Artist, OT Acute Rehabilitation Services Pager [phone] Office [phone]    Delight Stare 12/16/2019, 12:32 PM

## 2019-12-16 NOTE — Progress Notes (Signed)
Physical Therapy Treatment Patient Details Name: Megan [name] MRN: [id number] DOB: [birth year] Today's Date: 12/16/2019    History of Present Illness 74 y.o. female with medical history significant for CAD s/p CABG September [id number], chronic diastolic CHF, hypertension, type 2 diabetes, CKD stage III, hypothyroidism, hyperlipidemia, anxiety, obesity, and OSA who presents to the ED for evaluation of left arm pain after a fall. Pt found to have acute fx of L radius and ulna, requiring non-emergent surgery. S/P ORIF L ulna/radius 12/13/19.     PT Comments    Pt seen for PT following LUE ORIF 12/13/19. She required min assist transfers and min/HHA ambulation 10' x 2, +2 for safety/chair follow. Pt in recliner at end of session. Current POC remains appropriate.   Follow Up Recommendations  SNF;Supervision/Assistance - 24 hour     Equipment Recommendations  3in1 (PT)    Recommendations for Other Services       Precautions / Restrictions Precautions Precautions: Fall Required Braces or Orthoses: Sling;Other Brace Other Brace: long arm splint LUE Restrictions Weight Bearing Restrictions: Yes LUE Weight Bearing: Non weight bearing    Mobility  Bed Mobility Overal bed mobility: Needs Assistance             General bed mobility comments: seated EOB upon entry  Transfers Overall transfer level: Needs assistance Equipment used: 1 person hand held assist Transfers: Sit to/from Stand Sit to Stand: Min assist;+2 safety/equipment Stand pivot transfers: Min assist       General transfer comment: cues for sequencing, assist to power up and stabilize balance  Ambulation/Gait Ambulation/Gait assistance: Min assist;+2 safety/equipment Gait Distance (Feet): 10 Feet(x 2) Assistive device: 1 person hand held assist Gait Pattern/deviations: Step-through pattern;Decreased stride length Gait velocity: decreased Gait velocity interpretation: <1.8 ft/sec, indicate of risk for recurrent  falls General Gait Details: slow, guarded gait; assist to maintain balance   Stairs             Wheelchair Mobility    Modified Rankin (Stroke Patients Only)       Balance Overall balance assessment: Needs assistance Sitting-balance support: Feet supported;No upper extremity supported Sitting balance-Leahy Scale: Fair     Standing balance support: Single extremity supported;During functional activity Standing balance-Leahy Scale: Poor Standing balance comment: reliant on external support                            Cognition Arousal/Alertness: Awake/alert Behavior During Therapy: WFL for tasks assessed/performed Overall Cognitive Status: Within Functional Limits for tasks assessed                                        Exercises Other Exercises Other Exercises: x 5 reps R UE foward press and abduction at shoulder  Other Exercises: x 5 reps L UE shoudler elevation and finger flexion/extension as tolerated     General Comments General comments (skin integrity, edema, etc.): VSS      Pertinent Vitals/Pain Pain Assessment: Faces Faces Pain Scale: Hurts even more Pain Location: LUE Pain Descriptors / Indicators: Grimacing;Discomfort Pain Intervention(s): Limited activity within patient's tolerance;Repositioned;Monitored during session    Home Living                      Prior Function            PT Goals (current goals can now be found  in the care plan section) Acute Rehab PT Goals Patient Stated Goal: To go to rehab and get stronger Progress towards PT goals: Progressing toward goals    Frequency    Min 3X/week      PT Plan Current plan remains appropriate    Co-evaluation PT/OT/SLP Co-Evaluation/Treatment: Yes Reason for Co-Treatment: For patient/therapist safety;To address functional/ADL transfers PT goals addressed during session: Mobility/safety with mobility;Balance OT goals addressed during session:  ADL's and self-care      AM-PAC PT "6 Clicks" Mobility   Outcome Measure  Help needed turning from your back to your side while in a flat bed without using bedrails?: A Lot Help needed moving from lying on your back to sitting on the side of a flat bed without using bedrails?: A Lot Help needed moving to and from a bed to a chair (including a wheelchair)?: A Little Help needed standing up from a chair using your arms (e.g., wheelchair or bedside chair)?: A Little Help needed to walk in hospital room?: A Little Help needed climbing 3-5 steps with a railing? : Total 6 Click Score: 14    End of Session Equipment Utilized During Treatment: Gait belt Activity Tolerance: Patient tolerated treatment well Patient left: in chair;with call bell/phone within reach;with chair alarm set Nurse Communication: Mobility status PT Visit Diagnosis: Unsteadiness on feet (R26.81);Muscle weakness (generalized) (M62.81);History of falling (Z91.81);Pain Pain - Right/Left: Left Pain - part of body: Arm     Time: [id number] PT Time Calculation (min) (ACUTE ONLY): 23 min  Charges:  $Gait Training: 8-22 mins                     Lorrin Goodell, PT  Office # [phone] Pager [phone]    Lorriane Shire 12/16/2019, 1:34 PM

## 2019-12-16 NOTE — Progress Notes (Signed)
The chaplain followed-up with the patient after surgery. The patient's mood seemed to be better following the procedure. The chaplain does not see a need to follow-up further.  Brion Aliment Chaplain Resident For questions concerning this note please contact me by pager [phone]

## 2019-12-16 NOTE — Plan of Care (Signed)

## 2019-12-16 NOTE — TOC Progression Note (Addendum)
Transition of Care [facility], [place]) - Progression Note    Patient Details  Name: Megan [name] MRN: [id number] Date of Birth: 1946-07-31  Transition of Care [facility]) CM/SW [place], [place] Phone Number: 12/16/2019, 10:55 AM  Clinical Narrative:    CSW faxed in clinicals Saturday, per [facility] Medicare this morning the authorization still under review. Pt preferred SNF [facility] has offered, CSW requested new COVID screen.  When auth received and new COVID resulted will assist in d/c. CSW has spoken w/ pt and pt sister [name] (via speakerphone while in room). Additional clinicals faxed to insurance.    Expected Discharge Plan: [place] Barriers to Discharge: Continued Medical Work up, Ship broker  Expected Discharge Plan and Services Expected Discharge Plan: [place] In-house Referral: Clinical Social Work Discharge Planning Services: CM Consult Post Acute Care Choice: [place], Durable Medical Equipment Living arrangements for the past 2 months: Single Family Home                  Readmission Risk Interventions Readmission Risk Prevention Plan 12/14/2019  Transportation Screening Complete  PCP or Specialist Appt within 5-7 Days Not Complete  Not Complete comments plan for SNF  Home Care Screening Complete  Medication Review (RN CM) Referral to Pharmacy  Some recent data might be hidden

## 2019-12-17 LAB — GLUCOSE, CAPILLARY
Glucose-Capillary: 139 mg/dL — ABNORMAL HIGH (ref 70–99)
Glucose-Capillary: 129 mg/dL — ABNORMAL HIGH (ref 70–99)
Glucose-Capillary: 153 mg/dL — ABNORMAL HIGH (ref 70–99)
Glucose-Capillary: 137 mg/dL — ABNORMAL HIGH (ref 70–99)

## 2019-12-17 MED ORDER — MAGNESIUM CITRATE PO SOLN
1.0000 | Freq: Once | ORAL | Status: AC
  Administered 2019-12-17: 1 via ORAL
  Filled 2019-12-17: qty 296

## 2019-12-17 MED ORDER — TRAMADOL HCL 50 MG PO TABS: 50.0000 mg | ORAL_TABLET | Freq: Four times a day (QID) | ORAL | 0 refills | Status: AC | PRN

## 2019-12-17 MED ORDER — POLYETHYLENE GLYCOL 3350 17 G PO PACK: 17.0000 g | PACK | Freq: Every day | ORAL | 0 refills | Status: DC

## 2019-12-17 MED ORDER — HYDROCORTISONE ACETATE 25 MG RE SUPP
25.0000 mg | Freq: Two times a day (BID) | RECTAL | Status: DC
  Administered 2019-12-17 – 2019-12-18 (×2): 25 mg via RECTAL
  Filled 2019-12-17 (×2): qty 1

## 2019-12-17 MED ORDER — SENNOSIDES-DOCUSATE SODIUM 8.6-50 MG PO TABS: 1.0000 | ORAL_TABLET | Freq: Every day | ORAL | Status: DC

## 2019-12-17 MED ORDER — MAGNESIUM CITRATE PO SOLN
0.5000 | Freq: Once | ORAL | Status: AC
  Administered 2019-12-17: 0.5 via ORAL
  Filled 2019-12-17: qty 296

## 2019-12-17 NOTE — Discharge Summary (Signed)
Physician Discharge Summary  Megan [name] [id number] DOB: Nov 15, 1945 DOA: 12/11/2019  PCP: [name], MD  Admit date: 12/11/2019 Discharge date: 12/17/2019  Admitted From: Home Disposition: Skilled nursing facility  Recommendations for Outpatient Follow-up:  1. Follow up with PCP in 1-2 weeks 2. Follow-up with orthopedics as scheduled.  They will call for follow-up in 2 weeks.  Discharge Condition: Stable CODE STATUS: Full code Diet recommendation: Low-salt low-carb diet.  Discharge summary: 74 year old female with history of coronary artery disease status post CABG in September [id number], chronic diastolic heart failure, hypertension, type 2 diabetes, chronic kidney disease stage III AAA, hypothyroidism and hyperlipidemia, anxiety and sleep apnea not using CPAP for long time, multiple recent fractures.  She walks with a walker at home. Presented to the ER with left arm pain after falling at home.  In the emergency room, hemodynamically stable.  Skeletal survey negative except left forearm fractures. Patient unable to ambulate and unsafe to go home.  Admitted for inpatient treatment, surgical fixation.  Closed fracture of left radius and ulna: Patient underwent ORIF 4/16.  Surgically stable as per surgery.  Adequate pain medications.  Nonweightbearing left wrist and elbow. With recent multiple fractures and medical issues, she is deconditioned and will need rehab.  Refer to inpatient physical therapy at the skilled nursing rehab.  Coronary artery disease status post CABG: Denies any chest pain.  Plavix was resumed.  Stable.  On beta-blockers.  Chronic diastolic dysfunction: Euvolemic and well compensated.  On maintenance Lasix that is continued.  Type 2 diabetes: On Metformin at home.  Resume on discharge.  Hypertension: On home medications including metoprolol, losartan, spironolactone and Lasix.  Hypothyroidism: On Synthroid.  Patient is medically stable to transfer  to skilled level of care.  Discharge Diagnoses:  Principal Problem:   Closed fracture of left radius and ulna Active Problems:   Chronic diastolic CHF (congestive heart failure) (HCC)   DM (diabetes mellitus), type 2 with complications (HCC)   CKD (chronic kidney disease) stage 3, GFR 30-59 ml/min   Hypothyroidism   S/P CABG x 2   CAD (coronary artery disease)   Hyperlipidemia associated with type 2 diabetes mellitus ([place])   Hypertension associated with diabetes ([name])   Fracture closed of upper end of forearm   Obesity (BMI 30-39.9)    Discharge Instructions  Discharge Instructions    Call MD for:  redness, tenderness, or signs of infection (pain, swelling, redness, odor or green/yellow discharge around incision site)   Complete by: As directed    Call MD for:  severe uncontrolled pain   Complete by: As directed    Diet - low sodium heart healthy   Complete by: As directed    Diet Carb Modified   Complete by: As directed    Increase activity slowly   Complete by: As directed      Allergies as of 12/17/2019      Reactions   Aspirin Anaphylaxis   Bee Pollen Swelling, Other (See Comments)   Extreme swelling due to bee stings   Codeine Nausea And Vomiting   Fish Oil Diarrhea   Sulfa Antibiotics Nausea And Vomiting   Hornet Venom Swelling, Other (See Comments)   Foot became swollen 3X it's normal size   Iron Other (See Comments)   Severe Headache   Pantoprazole Diarrhea   Tape Other (See Comments)   "Tape leaves burn-like places and blisters"   Camphor Dermatitis   Lisinopril Cough   Penicillins Rash, Other (See Comments)  Swelling was of the    Sulfasalazine Nausea And Vomiting   Vicodin [hydrocodone-acetaminophen] Nausea And Vomiting      Medication List    STOP taking these medications   pantoprazole 40 MG tablet Commonly known as: PROTONIX     TAKE these medications   acetaminophen 650 MG CR tablet Commonly known as: TYLENOL Take 650 mg by mouth every  8 (eight) hours.   allopurinol 100 MG tablet Commonly known as: ZYLOPRIM Take 1 tablet (100 mg total) by mouth daily.   atorvastatin 80 MG tablet Commonly known as: LIPITOR Take 1 tablet (80 mg total) by mouth daily at 6 PM.   calcium carbonate 500 MG chewable tablet Commonly known as: TUMS - dosed in mg elemental calcium Chew 1-2 tablets by mouth as needed for indigestion or heartburn.   clopidogrel 75 MG tablet Commonly known as: PLAVIX Take 1 tablet (75 mg total) by mouth daily.   diclofenac sodium 1 % Gel Commonly known as: VOLTAREN Apply 2 gm topically to left knee What changed:   how much to take  how to take this  when to take this  reasons to take this  additional instructions   EPINEPHrine 0.3 mg/0.3 mL Soaj injection Commonly known as: EPI-PEN Inject 0.3 mg into the muscle once as needed for anaphylaxis.   famotidine 20 MG tablet Commonly known as: PEPCID Take 20 mg by mouth 2 (two) times daily.   furosemide 40 MG tablet Commonly known as: LASIX Take 1 tablet (40 mg total) by mouth 2 (two) times daily. What changed:   how much to take  when to take this  additional instructions   gabapentin 100 MG capsule Commonly known as: NEURONTIN Take 100 mg by mouth every 8 (eight) hours as needed (for pain).   levothyroxine 75 MCG tablet Commonly known as: SYNTHROID Take 75 mcg by mouth daily before breakfast.   Lidocaine 4 % Ptch Commonly known as: Aspercreme Lidocaine Place 1 patch onto the skin daily. APPLY TOPICALLY TO LOWER BACK ONCE DAILY FOR PAIN What changed:   when to take this  additional instructions   LORazepam 0.5 MG tablet Commonly known as: ATIVAN Take 0.5 mg by mouth in the morning and at bedtime.   losartan 25 MG tablet Commonly known as: COZAAR Take 0.5 tablets (12.5 mg total) by mouth daily.   Magnesium Oxide 400 MG Caps Take 1 capsule (400 mg total) by mouth daily. What changed: when to take this   meclizine 25 MG  tablet Commonly known as: ANTIVERT Take 25 mg by mouth 3 (three) times daily as needed for dizziness.   melatonin 3 MG Tabs tablet Take 1 tablet (3 mg total) by mouth at bedtime. FOR INSOMNIA What changed: additional instructions   metFORMIN 500 MG tablet Commonly known as: GLUCOPHAGE Take 1 tablet (500 mg total) by mouth 2 (two) times daily with a meal.   metoprolol succinate 25 MG 24 hr tablet Commonly known as: Toprol XL Take 1 tablet (25 mg total) by mouth daily.   multivitamins with iron Tabs tablet Take 1 tablet by mouth daily.   ondansetron 4 MG tablet Commonly known as: ZOFRAN Take 1 tablet (4 mg total) by mouth every 6 (six) hours as needed for nausea or vomiting. GIVE 1 TABLET BY MOUTH EVERY 6 HOURS AS NEEDED FOR NAUSEA/VOMITING What changed: additional instructions   OneTouch Verio test strip Generic drug: glucose blood USE TO TEST BLOOD GLUCOSE ONCE DAILY   polyethylene glycol 17 g packet  Commonly known as: MIRALAX / GLYCOLAX Take 17 g by mouth daily. Start taking on: December 18, 2019   polyvinyl alcohol 1.4 % ophthalmic solution Commonly known as: LIQUIFILM TEARS Place 1 drop into both eyes every 4 (four) hours as needed for dry eyes.   senna-docusate 8.6-50 MG tablet Commonly known as: Senokot-S Take 1 tablet by mouth at bedtime.   spironolactone 25 MG tablet Commonly known as: ALDACTONE Take 12.5 mg by mouth at bedtime.   traMADol 50 MG tablet Commonly known as: ULTRAM Take 1 tablet (50 mg total) by mouth every 6 (six) hours as needed for up to 5 days (breakthrough pain).   vitamin B-12 1000 MCG tablet Commonly known as: CYANOCOBALAMIN 1 tab by orally daily What changed:   how much to take  how to take this  when to take this  additional instructions   VITAMIN B6 PO Take 1 tablet by mouth daily.   Vitamin D (Ergocalciferol) 1.25 MG (50000 UNIT) Caps capsule Commonly known as: DRISDOL Take 50,000 Units by mouth every Thursday.       Contact information for after-discharge care    Destination    [facility]erred [facility] .   Service: Skilled Nursing Contact information: [place] [phone]             Allergies  Allergen Reactions  . Aspirin Anaphylaxis  . Bee Pollen Swelling and Other (See Comments)    Extreme swelling due to bee stings  . Codeine Nausea And Vomiting  . Fish Oil Diarrhea  . Sulfa Antibiotics Nausea And Vomiting  . Hornet Venom Swelling and Other (See Comments)    Foot became swollen 3X it's normal size   . Iron Other (See Comments)    Severe Headache  . Pantoprazole Diarrhea  . Tape Other (See Comments)    "Tape leaves burn-like places and blisters"  . Camphor Dermatitis  . Lisinopril Cough  . Penicillins Rash and Other (See Comments)    Swelling was of the    . Sulfasalazine Nausea And Vomiting  . Vicodin [Hydrocodone-Acetaminophen] Nausea And Vomiting    Consultations:  Orthopedics   Procedures/Studies: DG Forearm Left  Result Date: 12/11/2019 CLINICAL DATA:  74 year old female status post fall with pain. EXAM: LEFT FOREARM - 2 VIEW COMPARISON:  None. FINDINGS: Transverse but comminuted both-bone forearm fracture. Midshaft fractures of the left radius and ulna. 1/2 shaft with posterior displacement of the ulna, with similar anterior displacement but posterior angulation of the radius fracture. Alignment appears preserved at the left elbow and wrist. No other acute osseous abnormality identified. IMPRESSION: Acute transverse and mildly comminuted midshaft fractures of the left radius and ulna. Electronically Signed   By: Genevie Ann M.D.   On: 12/11/2019 19:06   CT Head Wo Contrast  Result Date: 12/11/2019 CLINICAL DATA:  Fill over chair, with positive head strike EXAM: CT HEAD WITHOUT CONTRAST TECHNIQUE: Contiguous axial images were obtained from the base of the skull through the vertex without intravenous  contrast. COMPARISON:  CT 09/20/2013 FINDINGS: Brain: No evidence of acute infarction, hemorrhage, hydrocephalus, extra-axial collection or mass lesion/mass effect. Symmetric prominence of the ventricles, cisterns and sulci compatible with parenchymal volume loss. Patchy areas of white matter hypoattenuation are most compatible with chronic microvascular angiopathy. Vascular: Atherosclerotic calcification of the carotid siphons. No hyperdense vessel. Skull: No significant scalp swelling, hematoma or calvarial fracture. Moderate hyperostosis frontalis interna, a benign incidental finding. No worrisome or suspicious osseous lesions. Sinuses/Orbits:  Paranasal sinuses and mastoid air cells are predominantly clear. Pneumatization of the petrous apices noted incidentally. Orbital structures are unremarkable aside from prior lens extractions. Other: None IMPRESSION: 1. No acute intracranial findings. No significant scalp swelling, hematoma, or calvarial fracture. 2. Mild parenchymal volume loss and chronic microvascular angiopathy. Electronically Signed   By: Lovena Le M.D.   On: 12/11/2019 20:43   CT Cervical Spine Wo Contrast  Result Date: 12/11/2019 CLINICAL DATA:  [place] over chair with positive head strike EXAM: CT CERVICAL SPINE WITHOUT CONTRAST TECHNIQUE: Multidetector CT imaging of the cervical spine was performed without intravenous contrast. Multiplanar CT image reconstructions were also generated. COMPARISON:  Cervical spine radiographs Jan 03, 2006 FINDINGS: Alignment: Preservation of the normal cervical lordosis without traumatic listhesis. No abnormally widened, perched or jumped facets. Normal alignment of the craniocervical and atlantoaxial articulations. Skull base and vertebrae: No acute fracture. No primary bone lesion or focal pathologic process. Incomplete fusion of the spinous process of C6. Soft tissues and spinal canal: No pre or paravertebral fluid or swelling. No visible canal hematoma. Disc  levels: Minimal spondylitic changes. No significant central canal or foraminal stenosis identified within the imaged levels of the spine. Mild-to-moderate atlantodental arthrosis. Upper chest: No acute abnormality in the upper chest or imaged lung apices. Other: Normal thyroid. Cervical carotid atherosclerosis seen bilaterally at the bifurcations. IMPRESSION: 1. No acute fracture or traumatic listhesis of the cervical spine. 2. Minimal spondylitic changes. 3. Cervical carotid atherosclerosis. Electronically Signed   By: Lovena Le M.D.   On: 12/11/2019 20:46   DG Knee Complete 4 Views Left  Result Date: 12/11/2019 CLINICAL DATA:  73 year old female status post fall with pain. EXAM: LEFT KNEE - COMPLETE 4+ VIEW COMPARISON:  01/08/2019. FINDINGS: No joint effusion on the cross-table lateral view. New surgical clips in the medial popliteal region since last year. Chronic tricompartmental degenerative spurring. Osteopenia. No acute osseous abnormality identified. No discrete soft tissue injury. IMPRESSION: No acute fracture or dislocation identified about the left knee. Electronically Signed   By: Genevie Ann M.D.   On: 12/11/2019 19:04    Subjective: Patient seen and examined.  No overnight events.  Waiting to go to rehab at the skilled nursing facility.  Has some pain mostly on the left wrist.   Discharge Exam: Vitals:   12/17/19 0537 12/17/19 0829  BP: (!) 125/49 (!) 122/54  Pulse: 70 74  Resp: 16 16  Temp: 97.7 F (36.5 C) 98.5 F (36.9 C)  SpO2: 96% 98%   Vitals:   12/16/19 2028 12/17/19 0500 12/17/19 0537 12/17/19 0829  BP: (!) 126/53  (!) 125/49 (!) 122/54  Pulse: 72  70 74  Resp: 16  16 16   Temp: 97.8 F (36.6 C)  97.7 F (36.5 C) 98.5 F (36.9 C)  TempSrc: Oral  Oral Oral  SpO2: 99%  96% 98%  Weight:  80 kg      General: Pt is alert, awake, not in acute distress Cardiovascular: RRR, S1/S2 +, no rubs, no gallops Respiratory: CTA bilaterally, no wheezing, no rhonchi Abdominal:  Soft, NT, ND, bowel sounds + Extremities: no edema, no cyanosis Left hand on posterior slab, distal neurovascular status intact.    The results of significant diagnostics from this hospitalization (including imaging, microbiology, ancillary and laboratory) are listed below for reference.     Microbiology: Recent Results (from the past 240 hour(s))  SARS CORONAVIRUS 2 (TAT 6-24 HRS) Nasopharyngeal Nasopharyngeal Swab     Status: None   Collection Time:  12/11/19 11:18 PM   Specimen: Nasopharyngeal Swab  Result Value Ref Range Status   SARS Coronavirus 2 NEGATIVE NEGATIVE Final    Comment: (NOTE) SARS-CoV-2 target nucleic acids are NOT DETECTED. The SARS-CoV-2 RNA is generally detectable in upper and lower respiratory specimens during the acute phase of infection. Negative results do not preclude SARS-CoV-2 infection, do not rule out co-infections with other pathogens, and should not be used as the sole basis for treatment or other patient management decisions. Negative results must be combined with clinical observations, patient history, and epidemiological information. The expected result is Negative. Fact Sheet for Patients: SugarRoll.be Fact Sheet for Healthcare Providers: https://www.woods-mathews.com/ This test is not yet approved or cleared by the [place] FDA and  has been authorized for detection and/or diagnosis of SARS-CoV-2 by FDA under an Emergency Use Authorization (EUA). This EUA will remain  in effect (meaning this test can be used) for the duration of the COVID-19 declaration under Section 56 4(b)(1) of the Act, 21 U.S.C. section 360bbb-3(b)(1), unless the authorization is terminated or revoked sooner. Performed at [facility], [place] [address], [place], [place] [address]   Surgical pcr screen     Status: Abnormal   Collection Time: 12/12/19  9:08 PM   Specimen: Nasal Mucosa; Nasal Swab  Result Value Ref Range  Status   MRSA, PCR POSITIVE (A) NEGATIVE Final    Comment: CRITICAL RESULT CALLED TO, READ BACK BY AND VERIFIED WITH: RN MARIA RASING [id number] @2334  THANEY    Staphylococcus aureus POSITIVE (A) NEGATIVE Final    Comment: CRITICAL RESULT CALLED TO, READ BACK BY AND VERIFIED WITH: RN Lupe Carney [id number] @2334  [facility] Performed at [place] [address], [place], [place] [address]   SARS CORONAVIRUS 2 (TAT 6-24 HRS) Nasopharyngeal Nasopharyngeal Swab     Status: None   Collection Time: 12/16/19 10:56 AM   Specimen: Nasopharyngeal Swab  Result Value Ref Range Status   SARS Coronavirus 2 NEGATIVE NEGATIVE Final    Comment: (NOTE) SARS-CoV-2 target nucleic acids are NOT DETECTED. The SARS-CoV-2 RNA is generally detectable in upper and lower respiratory specimens during the acute phase of infection. Negative results do not preclude SARS-CoV-2 infection, do not rule out co-infections with other pathogens, and should not be used as the sole basis for treatment or other patient management decisions. Negative results must be combined with clinical observations, patient history, and epidemiological information. The expected result is Negative. Fact Sheet for Patients: SugarRoll.be Fact Sheet for Healthcare Providers: https://www.woods-mathews.com/ This test is not yet approved or cleared by the [place] FDA and  has been authorized for detection and/or diagnosis of SARS-CoV-2 by FDA under an Emergency Use Authorization (EUA). This EUA will remain  in effect (meaning this test can be used) for the duration of the COVID-19 declaration under Section 56 4(b)(1) of the Act, 21 U.S.C. section 360bbb-3(b)(1), unless the authorization is terminated or revoked sooner. Performed at [facility], [place] [address] [place], [place] [address]      Labs: BNP (last 3 results) Recent Labs    12/16/19 0258  BNP [id number]*   Basic Metabolic  Panel: Recent Labs  Lab 12/11/19 2117 12/12/19 0349 12/16/19 0258  NA 141 141 137  K 4.3 3.8 3.9  CL 102 104 99  CO2 25 26 28   GLUCOSE 139* 151* 152*  BUN 28* 29* 44*  CREATININE 0.99 1.04* 1.13*  CALCIUM 9.8 9.2 8.9   Liver Function Tests: No results for  input(s): AST, ALT, ALKPHOS, BILITOT, PROT, ALBUMIN in the last 168 hours. No results for input(s): LIPASE, AMYLASE in the last 168 hours. No results for input(s): AMMONIA in the last 168 hours. CBC: Recent Labs  Lab 12/11/19 2117 12/12/19 0349 12/16/19 0258  WBC 8.1 6.8 8.3  NEUTROABS 6.2  --   --   HGB 12.2 10.5* 10.3*  HCT 40.4 33.5* 31.8*  MCV 98.3 94.9 94.4  PLT 260 244 259   Cardiac Enzymes: No results for input(s): CKTOTAL, CKMB, CKMBINDEX, TROPONINI in the last 168 hours. BNP: Invalid input(s): POCBNP CBG: Recent Labs  Lab 12/16/19 1158 12/16/19 1656 12/16/19 2032 12/17/19 0733 12/17/19 1139  GLUCAP 133* 111* 196* 139* 129*   D-Dimer No results for input(s): DDIMER in the last 72 hours. Hgb A1c No results for input(s): HGBA1C in the last 72 hours. Lipid Profile No results for input(s): CHOL, HDL, LDLCALC, TRIG, CHOLHDL, LDLDIRECT in the last 72 hours. Thyroid function studies No results for input(s): TSH, T4TOTAL, T3FREE, THYROIDAB in the last 72 hours.  Invalid input(s): FREET3 Anemia work up No results for input(s): VITAMINB12, FOLATE, FERRITIN, TIBC, IRON, RETICCTPCT in the last 72 hours. Urinalysis    Component Value Date/Time   COLORURINE YELLOW 12/12/2019 0045   APPEARANCEUR CLEAR 12/12/2019 0045   LABSPEC 1.016 12/12/2019 0045   PHURINE 5.0 12/12/2019 0045   GLUCOSEU NEGATIVE 12/12/2019 0045   HGBUR NEGATIVE 12/12/2019 0045   BILIRUBINUR NEGATIVE 12/12/2019 0045   KETONESUR NEGATIVE 12/12/2019 0045   PROTEINUR NEGATIVE 12/12/2019 0045   UROBILINOGEN Normal 01/19/2019 0000   NITRITE NEGATIVE 12/12/2019 0045   LEUKOCYTESUR TRACE (A) 12/12/2019 0045   Sepsis Labs Invalid  input(s): PROCALCITONIN,  WBC,  LACTICIDVEN Microbiology Recent Results (from the past 240 hour(s))  SARS CORONAVIRUS 2 (TAT 6-24 HRS) Nasopharyngeal Nasopharyngeal Swab     Status: None   Collection Time: 12/11/19 11:18 PM   Specimen: Nasopharyngeal Swab  Result Value Ref Range Status   SARS Coronavirus 2 NEGATIVE NEGATIVE Final    Comment: (NOTE) SARS-CoV-2 target nucleic acids are NOT DETECTED. The SARS-CoV-2 RNA is generally detectable in upper and lower respiratory specimens during the acute phase of infection. Negative results do not preclude SARS-CoV-2 infection, do not rule out co-infections with other pathogens, and should not be used as the sole basis for treatment or other patient management decisions. Negative results must be combined with clinical observations, patient history, and epidemiological information. The expected result is Negative. Fact Sheet for Patients: SugarRoll.be Fact Sheet for Healthcare Providers: https://www.woods-mathews.com/ This test is not yet approved or cleared by the [place] FDA and  has been authorized for detection and/or diagnosis of SARS-CoV-2 by FDA under an Emergency Use Authorization (EUA). This EUA will remain  in effect (meaning this test can be used) for the duration of the COVID-19 declaration under Section 56 4(b)(1) of the Act, 21 U.S.C. section 360bbb-3(b)(1), unless the authorization is terminated or revoked sooner. Performed at [facility], [place] [address], [place], [place] [address]   Surgical pcr screen     Status: Abnormal   Collection Time: 12/12/19  9:08 PM   Specimen: Nasal Mucosa; Nasal Swab  Result Value Ref Range Status   MRSA, PCR POSITIVE (A) NEGATIVE Final    Comment: CRITICAL RESULT CALLED TO, READ BACK BY AND VERIFIED WITH: RN MARIA RASING [id number] @2334  THANEY    Staphylococcus aureus POSITIVE (A) NEGATIVE Final    Comment: CRITICAL RESULT CALLED TO, READ  BACK BY AND VERIFIED  WITH: RN Lupe Carney [id number] @2334  THANEY Performed at [place] [address], [place], [place] [address]   SARS CORONAVIRUS 2 (TAT 6-24 HRS) Nasopharyngeal Nasopharyngeal Swab     Status: None   Collection Time: 12/16/19 10:56 AM   Specimen: Nasopharyngeal Swab  Result Value Ref Range Status   SARS Coronavirus 2 NEGATIVE NEGATIVE Final    Comment: (NOTE) SARS-CoV-2 target nucleic acids are NOT DETECTED. The SARS-CoV-2 RNA is generally detectable in upper and lower respiratory specimens during the acute phase of infection. Negative results do not preclude SARS-CoV-2 infection, do not rule out co-infections with other pathogens, and should not be used as the sole basis for treatment or other patient management decisions. Negative results must be combined with clinical observations, patient history, and epidemiological information. The expected result is Negative. Fact Sheet for Patients: SugarRoll.be Fact Sheet for Healthcare Providers: https://www.woods-mathews.com/ This test is not yet approved or cleared by the [place] FDA and  has been authorized for detection and/or diagnosis of SARS-CoV-2 by FDA under an Emergency Use Authorization (EUA). This EUA will remain  in effect (meaning this test can be used) for the duration of the COVID-19 declaration under Section 56 4(b)(1) of the Act, 21 U.S.C. section 360bbb-3(b)(1), unless the authorization is terminated or revoked sooner. Performed at [facility], [place] [address], [place], [place] [address]      Time coordinating discharge:  35 minutes  SIGNED:   Barb Merino, MD  [facility] 12/17/2019, 1:12 PM

## 2019-12-17 NOTE — TOC Transition Note (Signed)
Transition of Care [facility]) - CM/SW Discharge Note   Patient Details  Name: Megan [name] MRN: [id number] Date of Birth: [birth year]  [facility] CM/SW Contact:  Alexander Mt, LCSW Phone Number: 12/17/2019, 1:45 PM   Clinical Narrative:    SNF auth received from [facility], [id number]; this has been given to [place]. Pt aware and agreeable to tx today. [facility] called and will schedule for pick up today. Pt needs signed ativan and pain medications for discharge.    Final next level of care: [place] Barriers to Discharge: Continued Medical Work up, Ship broker   Patient Goals and CMS Choice Patient states their goals for this hospitalization and ongoing recovery are:: return to [place] but ultimately to get back home CMS Medicare.gov Compare Post Acute Care list provided to:: Patient Choice offered to / list presented to : Patient  Discharge Placement PASRR number recieved: 12/13/19            Patient chooses bed at: [place] Patient to be transferred to facility by: [place] Name of family member notified: pt will notify sister [name] Patient and family notified of of transfer: 12/17/19  Discharge Plan and Services In-house Referral: Clinical Social Work Discharge Planning Services: CM Consult Post Acute Care Choice: [place], Durable Medical Equipment             Readmission Risk Interventions Readmission Risk Prevention Plan 12/14/2019  Transportation Screening Complete  PCP or Specialist Appt within 5-7 Days Not Complete  Not Complete comments plan for SNF  PCP or Specialist Appt within 3-5 Days Not Complete  Not Complete comments d/c to SNF  Home Care Screening Complete  Medication Review (RN CM) Referral to Pharmacy  HRI or [place] Complete  Social Work Consult for [place] Planning/Counseling Complete  Palliative Care Screening Not Applicable  Medication Review (RN  Care Manager) Referral to Pharmacy  Some recent data might be hidden

## 2019-12-17 NOTE — Social Work (Signed)
Clinical Social Worker facilitated patient discharge including contacting patient family and facility to confirm patient discharge plans.  Clinical information faxed to facility and family agreeable with plan.  CSW arranged ambulance transport via [facility] to [facility]. RN to call [phone]  with report prior to discharge.  [facility] [phone]; pt is on "will call" list just let them know she is ready.  Clinical Social Worker will sign off for now as social work intervention is no longer needed. Please consult [name] again if new need arises.  Westley Hummer, MSW, LCSW Clinical Social Worker

## 2019-12-17 NOTE — Discharge Summary (Signed)
Physician Discharge Summary  Megan [name] [id number] DOB: 02/04/1946 DOA: 12/11/2019  PCP: [name], MD  Admit date: 12/11/2019 Discharge date: 12/17/2019  Admitted From: Home Disposition: Skilled nursing facility  Recommendations for Outpatient Follow-up:  1. Follow up with PCP in 1-2 weeks 2. Follow-up with orthopedics as scheduled.  They will call for follow-up in 2 weeks.  Discharge Condition: Stable CODE STATUS: Full code Diet recommendation: Low-salt low-carb diet.  Discharge summary: 74 year old female with history of coronary artery disease status post CABG in September [id number], chronic diastolic heart failure, hypertension, type 2 diabetes, chronic kidney disease stage III AAA, hypothyroidism and hyperlipidemia, anxiety and sleep apnea not using CPAP for long time, multiple recent fractures.  She walks with a walker at home. Presented to the ER with left arm pain after falling at home.  In the emergency room, hemodynamically stable.  Skeletal survey negative except left forearm fractures. Patient unable to ambulate and unsafe to go home.  Admitted for inpatient treatment, surgical fixation.  Closed fracture of left radius and ulna: Patient underwent ORIF 4/16.  Surgically stable as per surgery.  Adequate pain medications.  Nonweightbearing left wrist and elbow. With recent multiple fractures and medical issues, she is deconditioned and will need rehab.  Refer to inpatient physical therapy at the skilled nursing rehab.  Coronary artery disease status post CABG: Denies any chest pain.  Plavix was resumed.  Stable.  On beta-blockers.  Chronic diastolic dysfunction: Euvolemic and well compensated.  On maintenance Lasix that is continued.  Type 2 diabetes: On Metformin at home.  Resume on discharge.  Hypertension: On home medications including metoprolol, losartan, spironolactone and Lasix.  Hypothyroidism: On Synthroid.  Patient is medically stable to transfer  to skilled level of care.  Discharge Diagnoses:  Principal Problem:   Closed fracture of left radius and ulna Active Problems:   Chronic diastolic CHF (congestive heart failure) (HCC)   DM (diabetes mellitus), type 2 with complications (HCC)   CKD (chronic kidney disease) stage 3, GFR 30-59 ml/min   Hypothyroidism   S/P CABG x 2   CAD (coronary artery disease)   Hyperlipidemia associated with type 2 diabetes mellitus ([place])   Hypertension associated with diabetes ([place])   Fracture closed of upper end of forearm   Obesity (BMI 30-39.9)    Discharge Instructions  Discharge Instructions    Call MD for:  redness, tenderness, or signs of infection (pain, swelling, redness, odor or green/yellow discharge around incision site)   Complete by: As directed    Call MD for:  severe uncontrolled pain   Complete by: As directed    Diet - low sodium heart healthy   Complete by: As directed    Diet Carb Modified   Complete by: As directed    Increase activity slowly   Complete by: As directed      Allergies as of 12/17/2019      Reactions   Aspirin Anaphylaxis   Bee Pollen Swelling, Other (See Comments)   Extreme swelling due to bee stings   Codeine Nausea And Vomiting   Fish Oil Diarrhea   Sulfa Antibiotics Nausea And Vomiting   Hornet Venom Swelling, Other (See Comments)   Foot became swollen 3X it's normal size   Iron Other (See Comments)   Severe Headache   Pantoprazole Diarrhea   Tape Other (See Comments)   "Tape leaves burn-like places and blisters"   Camphor Dermatitis   Lisinopril Cough   Penicillins Rash, Other (See Comments)  Swelling was of the    Sulfasalazine Nausea And Vomiting   Vicodin [hydrocodone-acetaminophen] Nausea And Vomiting      Medication List    STOP taking these medications   LORazepam 0.5 MG tablet Commonly known as: ATIVAN   pantoprazole 40 MG tablet Commonly known as: PROTONIX     TAKE these medications   acetaminophen 650 MG CR  tablet Commonly known as: TYLENOL Take 650 mg by mouth every 8 (eight) hours.   allopurinol 100 MG tablet Commonly known as: ZYLOPRIM Take 1 tablet (100 mg total) by mouth daily.   atorvastatin 80 MG tablet Commonly known as: LIPITOR Take 1 tablet (80 mg total) by mouth daily at 6 PM.   calcium carbonate 500 MG chewable tablet Commonly known as: TUMS - dosed in mg elemental calcium Chew 1-2 tablets by mouth as needed for indigestion or heartburn.   clopidogrel 75 MG tablet Commonly known as: PLAVIX Take 1 tablet (75 mg total) by mouth daily.   diclofenac sodium 1 % Gel Commonly known as: VOLTAREN Apply 2 gm topically to left knee What changed:   how much to take  how to take this  when to take this  reasons to take this  additional instructions   EPINEPHrine 0.3 mg/0.3 mL Soaj injection Commonly known as: EPI-PEN Inject 0.3 mg into the muscle once as needed for anaphylaxis.   famotidine 20 MG tablet Commonly known as: PEPCID Take 20 mg by mouth 2 (two) times daily.   furosemide 40 MG tablet Commonly known as: LASIX Take 1 tablet (40 mg total) by mouth 2 (two) times daily. What changed:   how much to take  when to take this  additional instructions   gabapentin 100 MG capsule Commonly known as: NEURONTIN Take 100 mg by mouth every 8 (eight) hours as needed (for pain).   levothyroxine 75 MCG tablet Commonly known as: SYNTHROID Take 75 mcg by mouth daily before breakfast.   Lidocaine 4 % Ptch Commonly known as: Aspercreme Lidocaine Place 1 patch onto the skin daily. APPLY TOPICALLY TO LOWER BACK ONCE DAILY FOR PAIN What changed:   when to take this  additional instructions   losartan 25 MG tablet Commonly known as: COZAAR Take 0.5 tablets (12.5 mg total) by mouth daily.   Magnesium Oxide 400 MG Caps Take 1 capsule (400 mg total) by mouth daily. What changed: when to take this   meclizine 25 MG tablet Commonly known as: ANTIVERT Take 25 mg  by mouth 3 (three) times daily as needed for dizziness.   melatonin 3 MG Tabs tablet Take 1 tablet (3 mg total) by mouth at bedtime. FOR INSOMNIA What changed: additional instructions   metFORMIN 500 MG tablet Commonly known as: GLUCOPHAGE Take 1 tablet (500 mg total) by mouth 2 (two) times daily with a meal.   metoprolol succinate 25 MG 24 hr tablet Commonly known as: Toprol XL Take 1 tablet (25 mg total) by mouth daily.   multivitamins with iron Tabs tablet Take 1 tablet by mouth daily.   ondansetron 4 MG tablet Commonly known as: ZOFRAN Take 1 tablet (4 mg total) by mouth every 6 (six) hours as needed for nausea or vomiting. GIVE 1 TABLET BY MOUTH EVERY 6 HOURS AS NEEDED FOR NAUSEA/VOMITING What changed: additional instructions   OneTouch Verio test strip Generic drug: glucose blood USE TO TEST BLOOD GLUCOSE ONCE DAILY   polyethylene glycol 17 g packet Commonly known as: MIRALAX / GLYCOLAX Take 17 g by mouth  daily. Start taking on: December 18, 2019   polyvinyl alcohol 1.4 % ophthalmic solution Commonly known as: LIQUIFILM TEARS Place 1 drop into both eyes every 4 (four) hours as needed for dry eyes.   senna-docusate 8.6-50 MG tablet Commonly known as: Senokot-S Take 1 tablet by mouth at bedtime.   spironolactone 25 MG tablet Commonly known as: ALDACTONE Take 12.5 mg by mouth at bedtime.   traMADol 50 MG tablet Commonly known as: ULTRAM Take 1 tablet (50 mg total) by mouth every 6 (six) hours as needed for up to 5 days (breakthrough pain).   vitamin B-12 1000 MCG tablet Commonly known as: CYANOCOBALAMIN 1 tab by orally daily What changed:   how much to take  how to take this  when to take this  additional instructions   VITAMIN B6 PO Take 1 tablet by mouth daily.   Vitamin D (Ergocalciferol) 1.25 MG (50000 UNIT) Caps capsule Commonly known as: DRISDOL Take 50,000 Units by mouth every Thursday.      Contact information for after-discharge care     Destination    [facility] .   Service: Skilled Nursing Contact information: [place] [phone]             Allergies  Allergen Reactions  . Aspirin Anaphylaxis  . Bee Pollen Swelling and Other (See Comments)    Extreme swelling due to bee stings  . Codeine Nausea And Vomiting  . Fish Oil Diarrhea  . Sulfa Antibiotics Nausea And Vomiting  . Hornet Venom Swelling and Other (See Comments)    Foot became swollen 3X it's normal size   . Iron Other (See Comments)    Severe Headache  . Pantoprazole Diarrhea  . Tape Other (See Comments)    "Tape leaves burn-like places and blisters"  . Camphor Dermatitis  . Lisinopril Cough  . Penicillins Rash and Other (See Comments)    Swelling was of the    . Sulfasalazine Nausea And Vomiting  . Vicodin [Hydrocodone-Acetaminophen] Nausea And Vomiting    Consultations:  Orthopedics   Procedures/Studies: DG Forearm Left  Result Date: 12/11/2019 CLINICAL DATA:  74 year old female status post fall with pain. EXAM: LEFT FOREARM - 2 VIEW COMPARISON:  None. FINDINGS: Transverse but comminuted both-bone forearm fracture. Midshaft fractures of the left radius and ulna. 1/2 shaft with posterior displacement of the ulna, with similar anterior displacement but posterior angulation of the radius fracture. Alignment appears preserved at the left elbow and wrist. No other acute osseous abnormality identified. IMPRESSION: Acute transverse and mildly comminuted midshaft fractures of the left radius and ulna. Electronically Signed   By: Genevie Ann M.D.   On: 12/11/2019 19:06   CT Head Wo Contrast  Result Date: 12/11/2019 CLINICAL DATA:  Fill over chair, with positive head strike EXAM: CT HEAD WITHOUT CONTRAST TECHNIQUE: Contiguous axial images were obtained from the base of the skull through the vertex without intravenous contrast. COMPARISON:  CT 09/20/2013 FINDINGS: Brain: No  evidence of acute infarction, hemorrhage, hydrocephalus, extra-axial collection or mass lesion/mass effect. Symmetric prominence of the ventricles, cisterns and sulci compatible with parenchymal volume loss. Patchy areas of white matter hypoattenuation are most compatible with chronic microvascular angiopathy. Vascular: Atherosclerotic calcification of the carotid siphons. No hyperdense vessel. Skull: No significant scalp swelling, hematoma or calvarial fracture. Moderate hyperostosis frontalis interna, a benign incidental finding. No worrisome or suspicious osseous lesions. Sinuses/Orbits: Paranasal sinuses and mastoid air cells are predominantly clear. Pneumatization of  the petrous apices noted incidentally. Orbital structures are unremarkable aside from prior lens extractions. Other: None IMPRESSION: 1. No acute intracranial findings. No significant scalp swelling, hematoma, or calvarial fracture. 2. Mild parenchymal volume loss and chronic microvascular angiopathy. Electronically Signed   By: Lovena Le M.D.   On: 12/11/2019 20:43   CT Cervical Spine Wo Contrast  Result Date: 12/11/2019 CLINICAL DATA:  [place] over chair with positive head strike EXAM: CT CERVICAL SPINE WITHOUT CONTRAST TECHNIQUE: Multidetector CT imaging of the cervical spine was performed without intravenous contrast. Multiplanar CT image reconstructions were also generated. COMPARISON:  Cervical spine radiographs Jan 03, 2006 FINDINGS: Alignment: Preservation of the normal cervical lordosis without traumatic listhesis. No abnormally widened, perched or jumped facets. Normal alignment of the craniocervical and atlantoaxial articulations. Skull base and vertebrae: No acute fracture. No primary bone lesion or focal pathologic process. Incomplete fusion of the spinous process of C6. Soft tissues and spinal canal: No pre or paravertebral fluid or swelling. No visible canal hematoma. Disc levels: Minimal spondylitic changes. No significant  central canal or foraminal stenosis identified within the imaged levels of the spine. Mild-to-moderate atlantodental arthrosis. Upper chest: No acute abnormality in the upper chest or imaged lung apices. Other: Normal thyroid. Cervical carotid atherosclerosis seen bilaterally at the bifurcations. IMPRESSION: 1. No acute fracture or traumatic listhesis of the cervical spine. 2. Minimal spondylitic changes. 3. Cervical carotid atherosclerosis. Electronically Signed   By: Lovena Le M.D.   On: 12/11/2019 20:46   DG Knee Complete 4 Views Left  Result Date: 12/11/2019 CLINICAL DATA:  74 year old female status post fall with pain. EXAM: LEFT KNEE - COMPLETE 4+ VIEW COMPARISON:  01/08/2019. FINDINGS: No joint effusion on the cross-table lateral view. New surgical clips in the medial popliteal region since last year. Chronic tricompartmental degenerative spurring. Osteopenia. No acute osseous abnormality identified. No discrete soft tissue injury. IMPRESSION: No acute fracture or dislocation identified about the left knee. Electronically Signed   By: Genevie Ann M.D.   On: 12/11/2019 19:04    Subjective: Patient seen and examined.  No overnight events.  Waiting to go to rehab at the skilled nursing facility.  Has some pain mostly on the left wrist.   Discharge Exam: Vitals:   12/17/19 0829 12/17/19 1340  BP: (!) 122/54 (!) 126/55  Pulse: 74 69  Resp: 16 17  Temp: 98.5 F (36.9 C) 98.2 F (36.8 C)  SpO2: 98% 97%   Vitals:   12/17/19 0500 12/17/19 0537 12/17/19 0829 12/17/19 1340  BP:  (!) 125/49 (!) 122/54 (!) 126/55  Pulse:  70 74 69  Resp:  16 16 17   Temp:  97.7 F (36.5 C) 98.5 F (36.9 C) 98.2 F (36.8 C)  TempSrc:  Oral Oral Oral  SpO2:  96% 98% 97%  Weight: 80 kg       General: Pt is alert, awake, not in acute distress Cardiovascular: RRR, S1/S2 +, no rubs, no gallops Respiratory: CTA bilaterally, no wheezing, no rhonchi Abdominal: Soft, NT, ND, bowel sounds + Extremities: no edema,  no cyanosis Left hand on posterior slab, distal neurovascular status intact.    The results of significant diagnostics from this hospitalization (including imaging, microbiology, ancillary and laboratory) are listed below for reference.     Microbiology: Recent Results (from the past 240 hour(s))  SARS CORONAVIRUS 2 (TAT 6-24 HRS) Nasopharyngeal Nasopharyngeal Swab     Status: None   Collection Time: 12/11/19 11:18 PM   Specimen: Nasopharyngeal Swab  Result Value  Ref Range Status   SARS Coronavirus 2 NEGATIVE NEGATIVE Final    Comment: (NOTE) SARS-CoV-2 target nucleic acids are NOT DETECTED. The SARS-CoV-2 RNA is generally detectable in upper and lower respiratory specimens during the acute phase of infection. Negative results do not preclude SARS-CoV-2 infection, do not rule out co-infections with other pathogens, and should not be used as the sole basis for treatment or other patient management decisions. Negative results must be combined with clinical observations, patient history, and epidemiological information. The expected result is Negative. Fact Sheet for Patients: SugarRoll.be Fact Sheet for Healthcare Providers: https://www.woods-mathews.com/ This test is not yet approved or cleared by the [place] FDA and  has been authorized for detection and/or diagnosis of SARS-CoV-2 by FDA under an Emergency Use Authorization (EUA). This EUA will remain  in effect (meaning this test can be used) for the duration of the COVID-19 declaration under Section 56 4(b)(1) of the Act, 21 U.S.C. section 360bbb-3(b)(1), unless the authorization is terminated or revoked sooner. Performed at [facility], [place] [address] [place], [place] [address]   Surgical pcr screen     Status: Abnormal   Collection Time: 12/12/19  9:08 PM   Specimen: Nasal Mucosa; Nasal Swab  Result Value Ref Range Status   MRSA, PCR POSITIVE (A) NEGATIVE Final     Comment: CRITICAL RESULT CALLED TO, READ BACK BY AND VERIFIED WITH: RN MARIA RASING [id number] @2334  THANEY    Staphylococcus aureus POSITIVE (A) NEGATIVE Final    Comment: CRITICAL RESULT CALLED TO, READ BACK BY AND VERIFIED WITH: RN Lupe Carney [id number] @2334  THANEY Performed at [place] [address] [place], [place] [address]   SARS CORONAVIRUS 2 (TAT 6-24 HRS) Nasopharyngeal Nasopharyngeal Swab     Status: None   Collection Time: 12/16/19 10:56 AM   Specimen: Nasopharyngeal Swab  Result Value Ref Range Status   SARS Coronavirus 2 NEGATIVE NEGATIVE Final    Comment: (NOTE) SARS-CoV-2 target nucleic acids are NOT DETECTED. The SARS-CoV-2 RNA is generally detectable in upper and lower respiratory specimens during the acute phase of infection. Negative results do not preclude SARS-CoV-2 infection, do not rule out co-infections with other pathogens, and should not be used as the sole basis for treatment or other patient management decisions. Negative results must be combined with clinical observations, patient history, and epidemiological information. The expected result is Negative. Fact Sheet for Patients: SugarRoll.be Fact Sheet for Healthcare Providers: https://www.woods-mathews.com/ This test is not yet approved or cleared by the [place] FDA and  has been authorized for detection and/or diagnosis of SARS-CoV-2 by FDA under an Emergency Use Authorization (EUA). This EUA will remain  in effect (meaning this test can be used) for the duration of the COVID-19 declaration under Section 56 4(b)(1) of the Act, 21 U.S.C. section 360bbb-3(b)(1), unless the authorization is terminated or revoked sooner. Performed at [facility], [place] [address] [place], [place] [address]      Labs: BNP (last 3 results) Recent Labs    12/16/19 0258  BNP [id number]*   Basic Metabolic Panel: Recent Labs  Lab 12/11/19 2117  12/12/19 0349 12/16/19 0258  NA 141 141 137  K 4.3 3.8 3.9  CL 102 104 99  CO2 25 26 28   GLUCOSE 139* 151* 152*  BUN 28* 29* 44*  CREATININE 0.99 1.04* 1.13*  CALCIUM 9.8 9.2 8.9   Liver Function Tests: No results for input(s): AST, ALT, ALKPHOS, BILITOT, PROT, ALBUMIN in the last 168  hours. No results for input(s): LIPASE, AMYLASE in the last 168 hours. No results for input(s): AMMONIA in the last 168 hours. CBC: Recent Labs  Lab 12/11/19 2117 12/12/19 0349 12/16/19 0258  WBC 8.1 6.8 8.3  NEUTROABS 6.2  --   --   HGB 12.2 10.5* 10.3*  HCT 40.4 33.5* 31.8*  MCV 98.3 94.9 94.4  PLT 260 244 259   Cardiac Enzymes: No results for input(s): CKTOTAL, CKMB, CKMBINDEX, TROPONINI in the last 168 hours. BNP: Invalid input(s): POCBNP CBG: Recent Labs  Lab 12/16/19 1158 12/16/19 1656 12/16/19 2032 12/17/19 0733 12/17/19 1139  GLUCAP 133* 111* 196* 139* 129*   D-Dimer No results for input(s): DDIMER in the last 72 hours. Hgb A1c No results for input(s): HGBA1C in the last 72 hours. Lipid Profile No results for input(s): CHOL, HDL, LDLCALC, TRIG, CHOLHDL, LDLDIRECT in the last 72 hours. Thyroid function studies No results for input(s): TSH, T4TOTAL, T3FREE, THYROIDAB in the last 72 hours.  Invalid input(s): FREET3 Anemia work up No results for input(s): VITAMINB12, FOLATE, FERRITIN, TIBC, IRON, RETICCTPCT in the last 72 hours. Urinalysis    Component Value Date/Time   COLORURINE YELLOW 12/12/2019 0045   APPEARANCEUR CLEAR 12/12/2019 0045   LABSPEC 1.016 12/12/2019 0045   PHURINE 5.0 12/12/2019 0045   GLUCOSEU NEGATIVE 12/12/2019 0045   HGBUR NEGATIVE 12/12/2019 0045   BILIRUBINUR NEGATIVE 12/12/2019 0045   KETONESUR NEGATIVE 12/12/2019 0045   PROTEINUR NEGATIVE 12/12/2019 0045   UROBILINOGEN Normal 01/19/2019 0000   NITRITE NEGATIVE 12/12/2019 0045   LEUKOCYTESUR TRACE (A) 12/12/2019 0045   Sepsis Labs Invalid input(s): PROCALCITONIN,  WBC,   LACTICIDVEN Microbiology Recent Results (from the past 240 hour(s))  SARS CORONAVIRUS 2 (TAT 6-24 HRS) Nasopharyngeal Nasopharyngeal Swab     Status: None   Collection Time: 12/11/19 11:18 PM   Specimen: Nasopharyngeal Swab  Result Value Ref Range Status   SARS Coronavirus 2 NEGATIVE NEGATIVE Final    Comment: (NOTE) SARS-CoV-2 target nucleic acids are NOT DETECTED. The SARS-CoV-2 RNA is generally detectable in upper and lower respiratory specimens during the acute phase of infection. Negative results do not preclude SARS-CoV-2 infection, do not rule out co-infections with other pathogens, and should not be used as the sole basis for treatment or other patient management decisions. Negative results must be combined with clinical observations, patient history, and epidemiological information. The expected result is Negative. Fact Sheet for Patients: SugarRoll.be Fact Sheet for Healthcare Providers: https://www.woods-mathews.com/ This test is not yet approved or cleared by the [place] FDA and  has been authorized for detection and/or diagnosis of SARS-CoV-2 by FDA under an Emergency Use Authorization (EUA). This EUA will remain  in effect (meaning this test can be used) for the duration of the COVID-19 declaration under Section 56 4(b)(1) of the Act, 21 U.S.C. section 360bbb-3(b)(1), unless the authorization is terminated or revoked sooner. Performed at [facility], [place] [address], [place], [place] [address]   Surgical pcr screen     Status: Abnormal   Collection Time: 12/12/19  9:08 PM   Specimen: Nasal Mucosa; Nasal Swab  Result Value Ref Range Status   MRSA, PCR POSITIVE (A) NEGATIVE Final    Comment: CRITICAL RESULT CALLED TO, READ BACK BY AND VERIFIED WITH: RN MARIA RASING [id number] @2334  THANEY    Staphylococcus aureus POSITIVE (A) NEGATIVE Final    Comment: CRITICAL RESULT CALLED TO, READ BACK BY AND VERIFIED WITH: RN  Lupe Carney [id number] @2334  THANEY Performed at [facility], [place] [address], [place], [place] [address]   SARS CORONAVIRUS 2 (TAT 6-24 HRS) Nasopharyngeal Nasopharyngeal Swab     Status: None   Collection Time: 12/16/19 10:56 AM   Specimen: Nasopharyngeal Swab  Result Value Ref Range Status   SARS Coronavirus 2 NEGATIVE NEGATIVE Final    Comment: (NOTE) SARS-CoV-2 target nucleic acids are NOT DETECTED. The SARS-CoV-2 RNA is generally detectable in upper and lower respiratory specimens during the acute phase of infection. Negative results do not preclude SARS-CoV-2 infection, do not rule out co-infections with other pathogens, and should not be used as the sole basis for treatment or other patient management decisions. Negative results must be combined with clinical observations, patient history, and epidemiological information. The expected result is Negative. Fact Sheet for Patients: SugarRoll.be Fact Sheet for Healthcare Providers: https://www.woods-mathews.com/ This test is not yet approved or cleared by the [place] FDA and  has been authorized for detection and/or diagnosis of SARS-CoV-2 by FDA under an Emergency Use Authorization (EUA). This EUA will remain  in effect (meaning this test can be used) for the duration of the COVID-19 declaration under Section 56 4(b)(1) of the Act, 21 U.S.C. section 360bbb-3(b)(1), unless the authorization is terminated or revoked sooner. Performed at [facility], [place] [address], [place], [place] [address]      Time coordinating discharge:  35 minutes  SIGNED:   Barb Merino, MD  [facility] 12/17/2019, 2:19 PM

## 2019-12-17 NOTE — Progress Notes (Signed)
PROGRESS NOTE    Megan [name]  [id number] DOB: 1946/03/27 DOA: 12/11/2019 PCP: [name], MD    Brief Narrative:  74 year old female with history of coronary artery disease status post CABG in September [id number], chronic diastolic heart failure, hypertension, type 2 diabetes, chronic kidney disease stage III AAA, hypothyroidism and hyperlipidemia, anxiety and sleep apnea, multiple recent fractures.  She walks with a walker at home.  Presented to the ER with left arm pain after falling at home. In the emergency room, hemodynamically stable.  Skeletal survey negative except left forearm fractures.  Patient unable to ambulate and unsafe to go home.  Admitted for inpatient treatment, surgical fixation.   Assessment & Plan:   Principal Problem:   Closed fracture of left radius and ulna Active Problems:   Chronic diastolic CHF (congestive heart failure) (HCC)   DM (diabetes mellitus), type 2 with complications (HCC)   CKD (chronic kidney disease) stage 3, GFR 30-59 ml/min   Hypothyroidism   S/P CABG x 2   CAD (coronary artery disease)   Hyperlipidemia associated with type 2 diabetes mellitus ([place])   Hypertension associated with diabetes ([place])   Fracture closed of upper end of forearm   Obesity (BMI 30-39.9)  Closed fracture of left radius and ulna: Patient underwent ORIF 4/16. Post surgical management as per surgery.  Adequate pain medications.  Nonweightbearing left wrist and elbow. With recent multiple fractures and medical issues, she is deconditioned and will need rehab.  Refer to inpatient physical therapy at the skilled nursing rehab.  Coronary artery disease status post CABG: Denies any chest pain.  Plavix was resumed.  Chronic diastolic dysfunction: Euvolemic and well compensated.  On maintenance Lasix that is continued.  Type 2 diabetes: On Metformin at home.  Remains on sliding scale insulin.  Will resume Metformin on discharge.  Hypertension: On home medications  including metoprolol, losartan, spironolactone and Lasix.  Hypothyroidism: On Synthroid.  DVT prophylaxis: SCDs  Code Status: Full code Family Communication: None, patient communicating with family. Disposition Plan:  Status is: Inpatient  Remains inpatient appropriate because:Unsafe d/c plan   Dispo: The patient is from: Home              Anticipated d/c is to: SNF              Anticipated d/c date is: stable for discharge to SNF level of care.              Patient currently is medically stable to d/c.    Consultants:   Orthopedics  Procedures:   ORIF left forarm.  Antimicrobials:   None   Subjective: No overnight events.  Pain improved on the left fingers.   Objective: Vitals:   12/16/19 2028 12/17/19 0500 12/17/19 0537 12/17/19 0829  BP: (!) 126/53  (!) 125/49 (!) 122/54  Pulse: 72  70 74  Resp: 16  16 16   Temp: 97.8 F (36.6 C)  97.7 F (36.5 C) 98.5 F (36.9 C)  TempSrc: Oral  Oral Oral  SpO2: 99%  96% 98%  Weight:  80 kg      Intake/Output Summary (Last 24 hours) at 12/17/2019 1136 Last data filed at 12/17/2019 0915 Gross per 24 hour  Intake 720 ml  Output 500 ml  Net 220 ml   Filed Weights   12/14/19 0154 12/16/19 0624 12/17/19 0500  Weight: 77.2 kg 79.7 kg 80 kg    Examination:  General exam: Appears calm and comfortable  Respiratory system: Clear to auscultation.  Respiratory effort normal. Cardiovascular system: S1 & S2 heard, RRR.  Central nervous system: Alert and oriented. No focal neurological deficits. Extremities: Symmetric 5 x 5 power. Skin: No rashes, lesions or ulcers Psychiatry: Judgement and insight appear normal. Mood & affect appropriate.  Left forearm on postop cast . Distal vascularity intact.   Data Reviewed: I have personally reviewed following labs and imaging studies  CBC: Recent Labs  Lab 12/11/19 2117 12/12/19 0349 12/16/19 0258  WBC 8.1 6.8 8.3  NEUTROABS 6.2  --   --   HGB 12.2 10.5* 10.3*  HCT 40.4  33.5* 31.8*  MCV 98.3 94.9 94.4  PLT 260 244 [id number]   Basic Metabolic Panel: Recent Labs  Lab 12/11/19 2117 12/12/19 0349 12/16/19 0258  NA 141 141 137  K 4.3 3.8 3.9  CL 102 104 99  CO2 25 26 28   GLUCOSE 139* 151* 152*  BUN 28* 29* 44*  CREATININE 0.99 1.04* 1.13*  CALCIUM 9.8 9.2 8.9   GFR: Estimated Creatinine Clearance: 43.3 mL/min (A) (by C-G formula based on SCr of 1.13 mg/dL (H)). Liver Function Tests: No results for input(s): AST, ALT, ALKPHOS, BILITOT, PROT, ALBUMIN in the last 168 hours. No results for input(s): LIPASE, AMYLASE in the last 168 hours. No results for input(s): AMMONIA in the last 168 hours. Coagulation Profile: No results for input(s): INR, PROTIME in the last 168 hours. Cardiac Enzymes: No results for input(s): CKTOTAL, CKMB, CKMBINDEX, TROPONINI in the last 168 hours. BNP (last 3 results) No results for input(s): PROBNP in the last 8760 hours. HbA1C: No results for input(s): HGBA1C in the last 72 hours. CBG: Recent Labs  Lab 12/16/19 0737 12/16/19 1158 12/16/19 1656 12/16/19 2032 12/17/19 0733  GLUCAP 150* 133* 111* 196* 139*   Lipid Profile: No results for input(s): CHOL, HDL, LDLCALC, TRIG, CHOLHDL, LDLDIRECT in the last 72 hours. Thyroid Function Tests: No results for input(s): TSH, T4TOTAL, FREET4, T3FREE, THYROIDAB in the last 72 hours. Anemia Panel: No results for input(s): VITAMINB12, FOLATE, FERRITIN, TIBC, IRON, RETICCTPCT in the last 72 hours. Sepsis Labs: No results for input(s): PROCALCITON, LATICACIDVEN in the last 168 hours.  Recent Results (from the past 240 hour(s))  SARS CORONAVIRUS 2 (TAT 6-24 HRS) Nasopharyngeal Nasopharyngeal Swab     Status: None   Collection Time: 12/11/19 11:18 PM   Specimen: Nasopharyngeal Swab  Result Value Ref Range Status   SARS Coronavirus 2 NEGATIVE NEGATIVE Final    Comment: (NOTE) SARS-CoV-2 target nucleic acids are NOT DETECTED. The SARS-CoV-2 RNA is generally detectable in upper and  lower respiratory specimens during the acute phase of infection. Negative results do not preclude SARS-CoV-2 infection, do not rule out co-infections with other pathogens, and should not be used as the sole basis for treatment or other patient management decisions. Negative results must be combined with clinical observations, patient history, and epidemiological information. The expected result is Negative. Fact Sheet for Patients: SugarRoll.be Fact Sheet for Healthcare Providers: https://www.woods-mathews.com/ This test is not yet approved or cleared by the [place] FDA and  has been authorized for detection and/or diagnosis of SARS-CoV-2 by FDA under an Emergency Use Authorization (EUA). This EUA will remain  in effect (meaning this test can be used) for the duration of the COVID-19 declaration under Section 56 4(b)(1) of the Act, 21 U.S.C. section 360bbb-3(b)(1), unless the authorization is terminated or revoked sooner. Performed at [facility], [place] [address] [place],  [address]   Surgical pcr screen  Status: Abnormal   Collection Time: 12/12/19  9:08 PM   Specimen: Nasal Mucosa; Nasal Swab  Result Value Ref Range Status   MRSA, PCR POSITIVE (A) NEGATIVE Final    Comment: CRITICAL RESULT CALLED TO, READ BACK BY AND VERIFIED WITH: RN MARIA RASING [id number] @2334  THANEY    Staphylococcus aureus POSITIVE (A) NEGATIVE Final    Comment: CRITICAL RESULT CALLED TO, READ BACK BY AND VERIFIED WITH: RN Lupe Carney [id number] @2334  THANEY Performed at [facility] [address], [place], [place] [address]   SARS CORONAVIRUS 2 (TAT 6-24 HRS) Nasopharyngeal Nasopharyngeal Swab     Status: None   Collection Time: 12/16/19 10:56 AM   Specimen: Nasopharyngeal Swab  Result Value Ref Range Status   SARS Coronavirus 2 NEGATIVE NEGATIVE Final    Comment: (NOTE) SARS-CoV-2 target nucleic acids are NOT DETECTED. The  SARS-CoV-2 RNA is generally detectable in upper and lower respiratory specimens during the acute phase of infection. Negative results do not preclude SARS-CoV-2 infection, do not rule out co-infections with other pathogens, and should not be used as the sole basis for treatment or other patient management decisions. Negative results must be combined with clinical observations, patient history, and epidemiological information. The expected result is Negative. Fact Sheet for Patients: SugarRoll.be Fact Sheet for Healthcare Providers: https://www.woods-mathews.com/ This test is not yet approved or cleared by the [place] FDA and  has been authorized for detection and/or diagnosis of SARS-CoV-2 by FDA under an Emergency Use Authorization (EUA). This EUA will remain  in effect (meaning this test can be used) for the duration of the COVID-19 declaration under Section 56 4(b)(1) of the Act, 21 U.S.C. section 360bbb-3(b)(1), unless the authorization is terminated or revoked sooner. Performed at [facility], [place] [address], [place], [place] [address]          Radiology Studies: No results found.      Scheduled Meds: . atorvastatin  80 mg Oral q1800  . Chlorhexidine Gluconate Cloth  6 each Topical Q0600  . clopidogrel  75 mg Oral Daily  . famotidine  20 mg Oral BID  . furosemide  40 mg Oral q1800  . furosemide  80 mg Oral Q24H  . insulin aspart  0-9 Units Subcutaneous TID WC  . levothyroxine  75 mcg Oral QAC breakfast  . losartan  12.5 mg Oral Daily  . magnesium citrate  0.5 Bottle Oral Once  . melatonin  3 mg Oral QHS  . metoprolol succinate  25 mg Oral Daily  . polyethylene glycol  17 g Oral Daily  . senna-docusate  1 tablet Oral QHS  . spironolactone  12.5 mg Oral QHS   Continuous Infusions: . lactated ringers 10 mL/hr at 12/13/19 1257     LOS: 5 days    Time spent: 25 minutes    Barb Merino, MD [facility] Pager [phone]

## 2019-12-17 NOTE — Plan of Care (Signed)
  Problem: Education: Goal: Knowledge of General Education information will improve Description: Including pain rating scale, medication(s)/side effects and non-pharmacologic comfort measures Outcome: Progressing   Problem: Coping: Goal: Level of anxiety will decrease Outcome: Progressing  Patient very upset about straining to have a bowel movement, MD notified patient having difficulty. Provided education regarding straining and meds to help with bowel movement, as well as hemhorroid medication.  Problem: Activity: Goal: Risk for activity intolerance will decrease Outcome: Progressing  Patient up to bsc with no c/o SOB.

## 2019-12-17 NOTE — TOC Progression Note (Signed)
Transition of Care [facility]) - Progression Note    Patient Details  Name: SHAWNDEE TEJERO MRN: [id number] Date of Birth: 10/12/1945  Transition of Care [facility]) CM/SW [place], [place] Phone Number: 12/17/2019, 12:02 PM  Clinical Narrative:    Insurance authorization still being processed by [employer] CSW has reached out to MD with update that we will likely have determination today. [facility] aware and following for d/c.    Expected Discharge Plan: [place] Barriers to Discharge: Continued Medical Work up, Ship broker  Expected Discharge Plan and Services Expected Discharge Plan: [place] In-house Referral: Clinical Social Work Discharge Planning Services: CM Consult Post Acute Care Choice: [place], Durable Medical Equipment Living arrangements for the past 2 months: Single Family Home   Readmission Risk Interventions Readmission Risk Prevention Plan 12/14/2019  Transportation Screening Complete  PCP or Specialist Appt within 5-7 Days Not Complete  Not Complete comments plan for SNF  Home Care Screening Complete  Medication Review (RN CM) Referral to Pharmacy  Some recent data might be hidden

## 2019-12-18 LAB — GLUCOSE, CAPILLARY
Glucose-Capillary: 149 mg/dL — ABNORMAL HIGH (ref 70–99)
Glucose-Capillary: 126 mg/dL — ABNORMAL HIGH (ref 70–99)

## 2019-12-18 MED ORDER — HYDROCORTISONE ACETATE 25 MG RE SUPP: 25.0000 mg | Freq: Two times a day (BID) | RECTAL | 0 refills | Status: DC

## 2019-12-18 NOTE — TOC Transition Note (Signed)
Transition of Care [facility]) - CM/SW Discharge Note   Patient Details  Name: Megan [name] MRN: [id number] Date of Birth: 27-Dec-1945  Transition of Care [facility]) CM/SW Contact:  Alexander Mt, LCSW Phone Number: 12/18/2019, 12:19 PM   Clinical Narrative:    Pt has had BM, stable for d/c. Bedside RN aware, PTAR called for 1:00pm, PTAR papers updated, scripts remain on chart. [facility] aware and ready for pt.    Final next level of care: Skilled Nursing Facility Barriers to Discharge: Barriers Resolved   Patient Goals and CMS Choice Patient states their goals for this hospitalization and ongoing recovery are:: return to [facility] but ultimately to get back home CMS Medicare.gov Compare Post Acute Care list provided to:: Patient Choice offered to / list presented to : Patient  Discharge Placement PASRR number recieved: 12/13/19            Patient chooses bed at: [facility] Patient to be transferred to facility by: [facility] Name of family member notified: pt will notify sister [name] Patient and family notified of of transfer: 12/17/19  Discharge Plan and Services In-house Referral: Clinical Social Work Discharge Planning Services: CM Consult Post Acute Care Choice: [place], Durable Medical Equipment            Readmission Risk Interventions Readmission Risk Prevention Plan 12/14/2019  Transportation Screening Complete  PCP or Specialist Appt within 5-7 Days Not Complete  Not Complete comments plan for SNF  PCP or Specialist Appt within 3-5 Days Not Complete  Not Complete comments d/c to SNF  Home Care Screening Complete  Medication Review (RN CM) Referral to Pharmacy  HRI or [place] Complete  Social Work Consult for [place] Planning/Counseling Complete  Palliative Care Screening Not Applicable  Medication Review (RN Care Manager) Referral to Pharmacy  Some recent data might be hidden

## 2019-12-18 NOTE — Progress Notes (Signed)
Nsg Discharge Note  Admit Date:  12/11/2019 Discharge date: 12/18/2019   [name] to be D/C'd Skilled nursing facility per MD order.  AVS completed.  Copy for SNF in packet. Report called to Vina at [facility]. Patient/caregiver able to verbalize understanding.  Discharge Medication: Allergies as of 12/18/2019      Reactions   Aspirin Anaphylaxis   Bee Pollen Swelling, Other (See Comments)   Extreme swelling due to bee stings   Codeine Nausea And Vomiting   Fish Oil Diarrhea   Sulfa Antibiotics Nausea And Vomiting   [place] Venom Swelling, Other (See Comments)   Foot became swollen 3X it's normal size   Iron Other (See Comments)   Severe Headache   Pantoprazole Diarrhea   Tape Other (See Comments)   "Tape leaves burn-like places and blisters"   Camphor Dermatitis   Lisinopril Cough   Penicillins Rash, Other (See Comments)   Swelling was of the    Sulfasalazine Nausea And Vomiting   Vicodin [hydrocodone-acetaminophen] Nausea And Vomiting      Medication List    STOP taking these medications   LORazepam 0.5 MG tablet Commonly known as: ATIVAN   pantoprazole 40 MG tablet Commonly known as: PROTONIX     TAKE these medications   acetaminophen 650 MG CR tablet Commonly known as: TYLENOL Take 650 mg by mouth every 8 (eight) hours.   allopurinol 100 MG tablet Commonly known as: ZYLOPRIM Take 1 tablet (100 mg total) by mouth daily. Notes to patient: Tomorrow, 12/19/19.   atorvastatin 80 MG tablet Commonly known as: LIPITOR Take 1 tablet (80 mg total) by mouth daily at 6 PM. Notes to patient: Tonight, 12/18/19.   calcium carbonate 500 MG chewable tablet Commonly known as: TUMS - dosed in mg elemental calcium Chew 1-2 tablets by mouth as needed for indigestion or heartburn.   clopidogrel 75 MG tablet Commonly known as: PLAVIX Take 1 tablet (75 mg total) by mouth daily. Notes to patient: Tomorrow, 12/19/19.   diclofenac sodium 1 % Gel Commonly known as:  VOLTAREN Apply 2 gm topically to left knee What changed:   how much to take  how to take this  when to take this  reasons to take this  additional instructions Notes to patient: Tomorrow, 12/19/19.   EPINEPHrine 0.3 mg/0.3 mL Soaj injection Commonly known as: EPI-PEN Inject 0.3 mg into the muscle once as needed for anaphylaxis.   famotidine 20 MG tablet Commonly known as: PEPCID Take 20 mg by mouth 2 (two) times daily. Notes to patient: Tonight, 12/18/19.   furosemide 40 MG tablet Commonly known as: LASIX Take 1 tablet (40 mg total) by mouth 2 (two) times daily. What changed:   how much to take  when to take this  additional instructions Notes to patient: Tonight, 12/18/19.   gabapentin 100 MG capsule Commonly known as: NEURONTIN Take 100 mg by mouth every 8 (eight) hours as needed (for pain).   hydrocortisone 25 MG suppository Commonly known as: ANUSOL-HC Place 1 suppository (25 mg total) rectally 2 (two) times daily. Notes to patient: Tonight, 12/18/19.   levothyroxine 75 MCG tablet Commonly known as: SYNTHROID Take 75 mcg by mouth daily before breakfast. Notes to patient: Tomorrow, 12/19/19.   Lidocaine 4 % Ptch Commonly known as: Aspercreme Lidocaine Place 1 patch onto the skin daily. APPLY TOPICALLY TO LOWER BACK ONCE DAILY FOR PAIN What changed:   when to take this  additional instructions Notes to patient: Tonight, 12/18/19.   losartan 25  MG tablet Commonly known as: COZAAR Take 0.5 tablets (12.5 mg total) by mouth daily. Notes to patient: Tomorrow, 12/19/19.   Magnesium Oxide 400 MG Caps Take 1 capsule (400 mg total) by mouth daily. What changed: when to take this Notes to patient: Tomorrow, 12/19/19.   meclizine 25 MG tablet Commonly known as: ANTIVERT Take 25 mg by mouth 3 (three) times daily as needed for dizziness.   melatonin 3 MG Tabs tablet Take 1 tablet (3 mg total) by mouth at bedtime. FOR INSOMNIA What changed: additional  instructions Notes to patient: Tonight, 12/18/19.   metFORMIN 500 MG tablet Commonly known as: GLUCOPHAGE Take 1 tablet (500 mg total) by mouth 2 (two) times daily with a meal. Notes to patient: Tonight, 12/18/19.   metoprolol succinate 25 MG 24 hr tablet Commonly known as: Toprol XL Take 1 tablet (25 mg total) by mouth daily. Notes to patient: Tomorrow, 12/19/19.   multivitamins with iron Tabs tablet Take 1 tablet by mouth daily. Notes to patient: Tomorrow, 12/19/19.   ondansetron 4 MG tablet Commonly known as: ZOFRAN Take 1 tablet (4 mg total) by mouth every 6 (six) hours as needed for nausea or vomiting. GIVE 1 TABLET BY MOUTH EVERY 6 HOURS AS NEEDED FOR NAUSEA/VOMITING What changed: additional instructions   OneTouch Verio test strip Generic drug: glucose blood USE TO TEST BLOOD GLUCOSE ONCE DAILY Notes to patient: Tomorrow, 12/19/19.   polyethylene glycol 17 g packet Commonly known as: MIRALAX / GLYCOLAX Take 17 g by mouth daily. Notes to patient: Tomorrow, 12/19/19.   polyvinyl alcohol 1.4 % ophthalmic solution Commonly known as: LIQUIFILM TEARS Place 1 drop into both eyes every 4 (four) hours as needed for dry eyes.   senna-docusate 8.6-50 MG tablet Commonly known as: Senokot-S Take 1 tablet by mouth at bedtime. Notes to patient: Tonight, 12/18/19.   spironolactone 25 MG tablet Commonly known as: ALDACTONE Take 12.5 mg by mouth at bedtime. Notes to patient: Tonight, 12/18/19.   traMADol 50 MG tablet Commonly known as: ULTRAM Take 1 tablet (50 mg total) by mouth every 6 (six) hours as needed for up to 5 days (breakthrough pain).   vitamin B-12 1000 MCG tablet Commonly known as: CYANOCOBALAMIN 1 tab by orally daily What changed:   how much to take  how to take this  when to take this  additional instructions Notes to patient: Tomorrow, 12/19/19.   VITAMIN B6 PO Take 1 tablet by mouth daily. Notes to patient: Tomorrow, 12/19/19.   Vitamin D  (Ergocalciferol) 1.25 MG (50000 UNIT) Caps capsule Commonly known as: DRISDOL Take 50,000 Units by mouth every Thursday. Notes to patient: Tomorrow, 12/19/19.       Discharge Assessment: Vitals:   12/17/19 2230 12/18/19 0622  BP: (!) 120/52 (!) 126/46  Pulse: 82 75  Resp: 18 16  Temp: 98.1 F (36.7 C) 97.8 F (36.6 C)  SpO2: 100% 100%   Skin clean, dry and intact without evidence of skin break down, no evidence of skin tears noted. IV catheter discontinued intact. Site without signs and symptoms of complications - no redness or edema noted at insertion site, patient denies c/o pain - only slight tenderness at site.  Dressing with slight pressure applied.  D/c Instructions-Education: Discharge instructions given to PTAR. Patient escorted via stretcher/PTAR.   Joni Reining, RN 12/18/2019 3:28 PM

## 2019-12-18 NOTE — Progress Notes (Signed)
Physical Therapy Treatment Patient Details Name: Megan [name] MRN: [id number] DOB: 05-09-1946 Today's Date: 12/18/2019    History of Present Illness 74 y.o. female with medical history significant for CAD s/p CABG September [id number], chronic diastolic CHF, hypertension, type 2 diabetes, CKD stage III, hypothyroidism, hyperlipidemia, anxiety, obesity, and OSA who presents to the ED for evaluation of left arm pain after a fall. Pt found to have acute fx of L radius and ulna, requiring non-emergent surgery. S/P ORIF L ulna/radius 12/13/19.     PT Comments    Pt progressing well towards her physical therapy goals, remains very motivated to participate. Ambulating 30 feet at a min assist level via handheld assist vs right railing. Will likely benefit from use of assistive device such as a SPC or quad cane for additional support/stability. Pt still with some difficulty transitioning into standing so focused on serial sit to stands to practice technique. Continue to recommend SNF for ongoing Physical Therapy.       Follow Up Recommendations  SNF;Supervision/Assistance - 24 hour     Equipment Recommendations  Other (comment)(defer)    Recommendations for Other Services       Precautions / Restrictions Precautions Precautions: Fall Required Braces or Orthoses: Sling Restrictions Weight Bearing Restrictions: Yes LUE Weight Bearing: Non weight bearing    Mobility  Bed Mobility               General bed mobility comments: OOB in chair  Transfers Overall transfer level: Needs assistance Equipment used: None Transfers: Sit to/from Stand Sit to Stand: Min assist;Mod assist         General transfer comment: Min-modA to boost up to stand. Cues for rocking to gain momentum, glute activation to power up.  Ambulation/Gait Ambulation/Gait assistance: Min assist;+2 safety/equipment Gait Distance (Feet): 30 Feet Assistive device: 1 person hand held assist(R railing) Gait  Pattern/deviations: Step-through pattern;Decreased stride length;Trunk flexed Gait velocity: decreased Gait velocity interpretation: <1.8 ft/sec, indicate of risk for recurrent falls General Gait Details: MinA via handheld assist to navigate room, use of right railing once in hallway. Cues for upright posture, activity pacing. Chair follow utilized.   Stairs             Wheelchair Mobility    Modified Rankin (Stroke Patients Only)       Balance Overall balance assessment: Needs assistance Sitting-balance support: Feet supported;No upper extremity supported Sitting balance-Leahy Scale: Good     Standing balance support: Single extremity supported;During functional activity Standing balance-Leahy Scale: Poor Standing balance comment: reliant on external support                            Cognition Arousal/Alertness: Awake/alert Behavior During Therapy: WFL for tasks assessed/performed Overall Cognitive Status: Within Functional Limits for tasks assessed                                        Exercises Other Exercises Other Exercises: x3 Serial sit to stands Other Exercises: Sitting: scapular retractions x 10, RUE shoulder flexion AROM to ~90 x 10    General Comments        Pertinent Vitals/Pain Pain Assessment: Faces Faces Pain Scale: Hurts little more Pain Location: LUE Pain Descriptors / Indicators: Grimacing;Discomfort Pain Intervention(s): Limited activity within patient's tolerance;Monitored during session;Repositioned    Home Living Family/patient expects to be discharged to:: Skilled  nursing facility Living Arrangements: Alone                  Prior Function            PT Goals (current goals can now be found in the care plan section) Acute Rehab PT Goals Patient Stated Goal: To go to rehab and get stronger Potential to Achieve Goals: Good Progress towards PT goals: Progressing toward goals    Frequency     Min 3X/week      PT Plan Current plan remains appropriate    Co-evaluation              AM-PAC PT "6 Clicks" Mobility   Outcome Measure  Help needed turning from your back to your side while in a flat bed without using bedrails?: A Lot Help needed moving from lying on your back to sitting on the side of a flat bed without using bedrails?: A Lot Help needed moving to and from a bed to a chair (including a wheelchair)?: A Little Help needed standing up from a chair using your arms (e.g., wheelchair or bedside chair)?: A Little Help needed to walk in hospital room?: A Little Help needed climbing 3-5 steps with a railing? : A Lot 6 Click Score: 15    End of Session Equipment Utilized During Treatment: Gait belt;Other (comment)(sling) Activity Tolerance: Patient tolerated treatment well Patient left: in chair;with call bell/phone within reach;with chair alarm set Nurse Communication: Mobility status PT Visit Diagnosis: Unsteadiness on feet (R26.81);Muscle weakness (generalized) (M62.81);History of falling (Z91.81);Pain Pain - Right/Left: Left Pain - part of body: Arm     Time: 1201-1218 PT Time Calculation (min) (ACUTE ONLY): 17 min  Charges:  $Gait Training: 8-22 mins                       Wyona Almas, PT, DPT Acute Rehabilitation Services Pager [phone] Office [phone]    Deno Etienne 12/18/2019, 2:00 PM

## 2019-12-18 NOTE — Progress Notes (Signed)
Pt. appeared to have a vagal episode while trying to have a bowel movement on BSC. Pt. was straining very hard and stated she felt like she was going to pass out. Pt. became nauseated and vomited about 100 mL of emesis. This RN was present during entirety of episode. Vitals taken while on [facility] were as follows, B/P 99/61, pulse 80, O2 sat 100 on room air, respirations 18, and temp 98.1.. Pt. was assisted back to bed with the help of this RN and NT. Pt. was then provided a cool washcloth for forehead and a fan. This RN stayed in the room with the pt. a few more minutes. Vitals taken after pt. was in bed were as follows; B/P 120/52, pulse 82, O2 sat 100 on room air, respirations 18, and temp 98.1. Will continue to monitor.

## 2019-12-18 NOTE — Plan of Care (Signed)
  Problem: Education: Goal: Knowledge of General Education information will improve Description: Including pain rating scale, medication(s)/side effects and non-pharmacologic comfort measures 12/18/2019 1207 by Joni Reining, RN Outcome: Adequate for Discharge 12/18/2019 1204 by Joni Reining, RN Outcome: Adequate for Discharge   Problem: Clinical Measurements: Goal: Ability to maintain clinical measurements within normal limits will improve Outcome: Adequate for Discharge Goal: Will remain free from infection 12/18/2019 1207 by Joni Reining, RN Outcome: Adequate for Discharge 12/18/2019 1204 by Joni Reining, RN Outcome: Adequate for Discharge Goal: Diagnostic test results will improve Outcome: Adequate for Discharge Goal: Respiratory complications will improve Outcome: Adequate for Discharge Goal: Cardiovascular complication will be avoided Outcome: Adequate for Discharge   Problem: Activity: Goal: Risk for activity intolerance will decrease 12/18/2019 1207 by Joni Reining, RN Outcome: Adequate for Discharge 12/18/2019 1204 by Joni Reining, RN Outcome: Adequate for Discharge   Problem: Nutrition: Goal: Adequate nutrition will be maintained Outcome: Adequate for Discharge   Problem: Coping: Goal: Level of anxiety will decrease 12/18/2019 1207 by Joni Reining, RN Outcome: Adequate for Discharge 12/18/2019 1204 by Joni Reining, RN Outcome: Adequate for Discharge   Problem: Elimination: Goal: Will not experience complications related to bowel motility 12/18/2019 1207 by Joni Reining, RN Outcome: Adequate for Discharge 12/18/2019 1204 by Joni Reining, RN Outcome: Adequate for Discharge Goal: Will not experience complications related to urinary retention Outcome: Adequate for Discharge   Problem: Pain Managment: Goal: General experience of comfort will improve 12/18/2019 1207 by Joni Reining, RN Outcome: Adequate for Discharge 12/18/2019 1204 by Joni Reining, RN Outcome: Adequate for Discharge   Problem: Safety: Goal: Ability to remain free from injury will improve Outcome: Adequate for Discharge   Problem: Skin Integrity: Goal: Risk for impaired skin integrity will decrease Outcome: Adequate for Discharge  Patient for transfer to SNF today.

## 2019-12-18 NOTE — Social Work (Signed)
Clinical Social Worker facilitated patient discharge including contacting patient family and facility to confirm patient discharge plans.  Clinical information faxed to facility and family agreeable with plan.  CSW arranged ambulance transport via [facility] to [facility].  RN to call [phone]  with report prior to discharge.  Clinical Social Worker will sign off for now as social work intervention is no longer needed. Please consult us again if new need arises.  Rick Carruthers, MSW, LCSW Clinical Social Worker   

## 2019-12-18 NOTE — Discharge Instructions (Signed)
Radial Head Fracture  A radial head fracture is a break in the smaller bone in your forearm (radius). The break happens near the end of the bone at the elbow joint. There are two bones in your forearm. The radius, or radial bone, is the bone on the side of your thumb. There are different types of radial head fractures. The type depends on how much the bones have moved (been displaced) from their normal positions:  Type 1. This is a small fracture in which the bone pieces stay together (nondisplaced fracture).  Type 2. The fracture has bone pieces that are slightly moved.  Type 3. There are many fractures and moved bone pieces. What are the causes? This condition is often caused by falling and landing on an outstretched arm. What increases the risk? You are more likely to get this condition if you:  Are female.  Are 30-40 years old.  Have weak bones (osteoporosis). What are the signs or symptoms?  Swelling of the elbow joint.  Pain and trouble moving the elbow joint. How is this treated? Treatment for this condition may include:  Resting your arm.  Icing your arm.  Raising (elevating) the injured area above the level of your heart.  Taking medicines to help with the pain. Treatment depends on the type of fracture you have.  For type 1, you may be given a splint or sling to keep your arm and elbow from moving for several days.  For type 2, you may be given a splint or sling to keep your arm and elbow from moving for several days. If the bones have moved a lot, you may need surgery.  For type 3, you will often need surgery to have bone pieces removed. The entire radial head may need to be removed if the damage is very bad. Follow these instructions at home: Medicines  Take over-the-counter and prescription medicines only as told by your doctor.  Ask your doctor if the medicine prescribed to you: ? Requires you to avoid driving or using heavy machinery. ? Can cause  trouble pooping (constipation). You may need to take these actions to prevent or treat trouble pooping:  Drink enough fluid to keep your pee (urine) pale yellow.  Take over-the-counter or prescription medicines.  Eat foods that are high in fiber. These include beans, whole grains, and fresh fruits and vegetables.  Limit foods that are high in fat and sugar. These include fried or sweet foods. If you have a splint or sling:  Wear the splint or sling as told by your doctor. Remove it only as told by your doctor.  Loosen it if your fingers: ? Tingle. ? Become numb. ? Turn cold and blue.  Keep it clean and dry. If you have a splint:  Do not put pressure on any part of the splint until it is fully hardened. This may take several hours.  Check the skin around the splint every day. Tell your doctor about any concerns. Bathing  Do not take baths, swim, or use a hot tub until your doctor approves. Ask your doctor if you may take showers. You may only be allowed to take sponge baths.  If the splint or sling is not waterproof: ? Do not let it get wet. ? Cover it with a watertight covering when you take a bath or shower. Managing pain, stiffness, and swelling   If told, put ice on the injured area. ? If you have a removable splint or sling, remove   it as told by your doctor. ? Put ice in a plastic bag. ? Place a towel between your skin and the bag. ? Leave the ice on for 20 minutes, 2-3 times a day.  Move your fingers often.  Raise the injured area above the level of your heart while you are sitting or lying down. Activity  Ask your doctor when it is safe to drive if you have a splint or sling on your arm.  Do not lift anything that is heavier than 10 lb (4.5 kg), or the limit that you are told, until your doctor says that it is safe.  Return to your normal activities as told by your doctor. Ask your doctor what activities are safe for you.  Do exercises as told by your doctor  or physical therapist. General instructions  Do not use any products that contain nicotine or tobacco, such as cigarettes, e-cigarettes, and chewing tobacco. If you need help quitting, ask your doctor.  Keep all follow-up visits as told by your doctor. This is important. Contact a doctor if:  You have problems with your splint.  You have pain or swelling that gets worse. Get help right away if:  You have very bad pain.  You have fluid or a bad smell coming from your splint.  Your hand or fingers get cold or turn pale or blue.  You lose feeling in any part of your hand or arm. Summary  A radial head fracture is a break in the smaller bone in your forearm (radius). The break happens near the end of the bone at the elbow joint.  This break is often caused by falling and landing on an outstretched arm.  This condition may be treated with rest, ice, raising the arm, pain medicines, a splint or sling, and surgery. Treatment depends on the type of fracture you have. This information is not intended to replace advice given to you by your health care provider. Make sure you discuss any questions you have with your health care provider. Document Revised: 08/23/2018 Document Reviewed: 08/23/2018 Elsevier Patient Education  2020 [employer]  

## 2019-12-18 NOTE — Plan of Care (Signed)

## 2019-12-18 NOTE — Progress Notes (Signed)
Called report to [place] at [facility].

## 2019-12-18 NOTE — Discharge Summary (Signed)
Physician Discharge Summary  Megan [name] [id number] DOB: [birth year] DOA: 12/11/2019  PCP: [name], MD  Admit date: 12/11/2019 Discharge date: 12/18/2019  Admitted From: Home Disposition: Skilled nursing facility  Recommendations for Outpatient Follow-up:  1. Follow up with PCP in 1-2 weeks 2. Follow-up with orthopedics as scheduled.  They will call for follow-up in 2 weeks.  Discharge Condition: Stable CODE STATUS: Full code Diet recommendation: Low-salt low-carb diet.  Discharge summary: 74 year old female with history of coronary artery disease status post CABG in September [id number], chronic diastolic heart failure, hypertension, type 2 diabetes, chronic kidney disease stage III AAA, hypothyroidism and hyperlipidemia, anxiety and sleep apnea not using CPAP for long time, multiple recent fractures.  She walks with a walker at home. Presented to the ER with left arm pain after falling at home.  In the emergency room, hemodynamically stable.  Skeletal survey negative except left forearm fractures. Patient unable to ambulate and unsafe to go home.  Admitted for inpatient treatment, surgical fixation.  Closed fracture of left radius and ulna: Patient underwent ORIF 4/16.  Surgically stable as per surgery.  Adequate pain medications.  Nonweightbearing left wrist and elbow. With recent multiple fractures and medical issues, she is deconditioned and will need rehab.  Refer to inpatient physical therapy at the skilled nursing rehab.  Coronary artery disease status post CABG: Denies any chest pain.  Plavix was resumed.  Stable.  On beta-blockers.  Chronic diastolic dysfunction: Euvolemic and well compensated.  On maintenance Lasix that is continued.  Type 2 diabetes: On Metformin at home.  Resume on discharge.  Hypertension: On home medications including metoprolol, losartan, spironolactone and Lasix.  Hypothyroidism: On Synthroid.  Patient is medically stable to transfer  to skilled level of care. 4/20, she had persistent constipation problem so stayed in the hospital.  She was given good bowel regimen including magnesium citrate resulting in multiple bowel movements. We will keep patient on stool softener and MiraLAX. Can use magnesium citrate if no bowel movements for  2 to 3 days. Also has some rectal pain with hemorrhoids, can use steroid suppository.  Discharge Diagnoses:  Principal Problem:   Closed fracture of left radius and ulna Active Problems:   Chronic diastolic CHF (congestive heart failure) (HCC)   DM (diabetes mellitus), type 2 with complications (HCC)   CKD (chronic kidney disease) stage 3, GFR 30-59 ml/min   Hypothyroidism   S/P CABG x 2   CAD (coronary artery disease)   Hyperlipidemia associated with type 2 diabetes mellitus ([place])   Hypertension associated with diabetes ([place])   Fracture closed of upper end of forearm   Obesity (BMI 30-39.9)    Discharge Instructions  Discharge Instructions    Call MD for:  redness, tenderness, or signs of infection (pain, swelling, redness, odor or green/yellow discharge around incision site)   Complete by: As directed    Call MD for:  severe uncontrolled pain   Complete by: As directed    Diet - low sodium heart healthy   Complete by: As directed    Diet Carb Modified   Complete by: As directed    Increase activity slowly   Complete by: As directed      Allergies as of 12/18/2019      Reactions   Aspirin Anaphylaxis   Bee Pollen Swelling, Other (See Comments)   Extreme swelling due to bee stings   Codeine Nausea And Vomiting   Fish Oil Diarrhea   Sulfa Antibiotics Nausea And Vomiting  Hornet Venom Swelling, Other (See Comments)   Foot became swollen 3X it's normal size   Iron Other (See Comments)   Severe Headache   Pantoprazole Diarrhea   Tape Other (See Comments)   "Tape leaves burn-like places and blisters"   Camphor Dermatitis   Lisinopril Cough   Penicillins Rash, Other  (See Comments)   Swelling was of the    Sulfasalazine Nausea And Vomiting   Vicodin [hydrocodone-acetaminophen] Nausea And Vomiting      Medication List    STOP taking these medications   LORazepam 0.5 MG tablet Commonly known as: ATIVAN   pantoprazole 40 MG tablet Commonly known as: PROTONIX     TAKE these medications   acetaminophen 650 MG CR tablet Commonly known as: TYLENOL Take 650 mg by mouth every 8 (eight) hours.   allopurinol 100 MG tablet Commonly known as: ZYLOPRIM Take 1 tablet (100 mg total) by mouth daily.   atorvastatin 80 MG tablet Commonly known as: LIPITOR Take 1 tablet (80 mg total) by mouth daily at 6 PM.   calcium carbonate 500 MG chewable tablet Commonly known as: TUMS - dosed in mg elemental calcium Chew 1-2 tablets by mouth as needed for indigestion or heartburn.   clopidogrel 75 MG tablet Commonly known as: PLAVIX Take 1 tablet (75 mg total) by mouth daily.   diclofenac sodium 1 % Gel Commonly known as: VOLTAREN Apply 2 gm topically to left knee What changed:   how much to take  how to take this  when to take this  reasons to take this  additional instructions   EPINEPHrine 0.3 mg/0.3 mL Soaj injection Commonly known as: EPI-PEN Inject 0.3 mg into the muscle once as needed for anaphylaxis.   famotidine 20 MG tablet Commonly known as: PEPCID Take 20 mg by mouth 2 (two) times daily.   furosemide 40 MG tablet Commonly known as: LASIX Take 1 tablet (40 mg total) by mouth 2 (two) times daily. What changed:   how much to take  when to take this  additional instructions   gabapentin 100 MG capsule Commonly known as: NEURONTIN Take 100 mg by mouth every 8 (eight) hours as needed (for pain).   hydrocortisone 25 MG suppository Commonly known as: ANUSOL-HC Place 1 suppository (25 mg total) rectally 2 (two) times daily.   levothyroxine 75 MCG tablet Commonly known as: SYNTHROID Take 75 mcg by mouth daily before breakfast.    Lidocaine 4 % Ptch Commonly known as: Aspercreme Lidocaine Place 1 patch onto the skin daily. APPLY TOPICALLY TO LOWER BACK ONCE DAILY FOR PAIN What changed:   when to take this  additional instructions   losartan 25 MG tablet Commonly known as: COZAAR Take 0.5 tablets (12.5 mg total) by mouth daily.   Magnesium Oxide 400 MG Caps Take 1 capsule (400 mg total) by mouth daily. What changed: when to take this   meclizine 25 MG tablet Commonly known as: ANTIVERT Take 25 mg by mouth 3 (three) times daily as needed for dizziness.   melatonin 3 MG Tabs tablet Take 1 tablet (3 mg total) by mouth at bedtime. FOR INSOMNIA What changed: additional instructions   metFORMIN 500 MG tablet Commonly known as: GLUCOPHAGE Take 1 tablet (500 mg total) by mouth 2 (two) times daily with a meal.   metoprolol succinate 25 MG 24 hr tablet Commonly known as: Toprol XL Take 1 tablet (25 mg total) by mouth daily.   multivitamins with iron Tabs tablet Take 1 tablet by  mouth daily.   ondansetron 4 MG tablet Commonly known as: ZOFRAN Take 1 tablet (4 mg total) by mouth every 6 (six) hours as needed for nausea or vomiting. GIVE 1 TABLET BY MOUTH EVERY 6 HOURS AS NEEDED FOR NAUSEA/VOMITING What changed: additional instructions   OneTouch Verio test strip Generic drug: glucose blood USE TO TEST BLOOD GLUCOSE ONCE DAILY   polyethylene glycol 17 g packet Commonly known as: MIRALAX / GLYCOLAX Take 17 g by mouth daily.   polyvinyl alcohol 1.4 % ophthalmic solution Commonly known as: LIQUIFILM TEARS Place 1 drop into both eyes every 4 (four) hours as needed for dry eyes.   senna-docusate 8.6-50 MG tablet Commonly known as: Senokot-S Take 1 tablet by mouth at bedtime.   spironolactone 25 MG tablet Commonly known as: ALDACTONE Take 12.5 mg by mouth at bedtime.   traMADol 50 MG tablet Commonly known as: ULTRAM Take 1 tablet (50 mg total) by mouth every 6 (six) hours as needed for up to 5 days  (breakthrough pain).   vitamin B-12 1000 MCG tablet Commonly known as: CYANOCOBALAMIN 1 tab by orally daily What changed:   how much to take  how to take this  when to take this  additional instructions   VITAMIN B6 PO Take 1 tablet by mouth daily.   Vitamin D (Ergocalciferol) 1.25 MG (50000 UNIT) Caps capsule Commonly known as: DRISDOL Take 50,000 Units by mouth every Thursday.      Contact information for after-discharge care    Destination    [facility]erred [facility] .   Service: Skilled Nursing Contact information: [place] [phone]             Allergies  Allergen Reactions  . Aspirin Anaphylaxis  . Bee Pollen Swelling and Other (See Comments)    Extreme swelling due to bee stings  . Codeine Nausea And Vomiting  . Fish Oil Diarrhea  . Sulfa Antibiotics Nausea And Vomiting  . Hornet Venom Swelling and Other (See Comments)    Foot became swollen 3X it's normal size   . Iron Other (See Comments)    Severe Headache  . Pantoprazole Diarrhea  . Tape Other (See Comments)    "Tape leaves burn-like places and blisters"  . Camphor Dermatitis  . Lisinopril Cough  . Penicillins Rash and Other (See Comments)    Swelling was of the    . Sulfasalazine Nausea And Vomiting  . Vicodin [Hydrocodone-Acetaminophen] Nausea And Vomiting    Consultations:  Orthopedics   Procedures/Studies: DG Forearm Left  Result Date: 12/11/2019 CLINICAL DATA:  73 year old female status post fall with pain. EXAM: LEFT FOREARM - 2 VIEW COMPARISON:  None. FINDINGS: Transverse but comminuted both-bone forearm fracture. Midshaft fractures of the left radius and ulna. 1/2 shaft with posterior displacement of the ulna, with similar anterior displacement but posterior angulation of the radius fracture. Alignment appears preserved at the left elbow and wrist. No other acute osseous abnormality identified. IMPRESSION:  Acute transverse and mildly comminuted midshaft fractures of the left radius and ulna. Electronically Signed   By: Genevie Ann M.D.   On: 12/11/2019 19:06   CT Head Wo Contrast  Result Date: 12/11/2019 CLINICAL DATA:  Fill over chair, with positive head strike EXAM: CT HEAD WITHOUT CONTRAST TECHNIQUE: Contiguous axial images were obtained from the base of the skull through the vertex without intravenous contrast. COMPARISON:  CT 09/20/2013 FINDINGS: Brain: No evidence of acute infarction, hemorrhage, hydrocephalus, extra-axial collection  or mass lesion/mass effect. Symmetric prominence of the ventricles, cisterns and sulci compatible with parenchymal volume loss. Patchy areas of white matter hypoattenuation are most compatible with chronic microvascular angiopathy. Vascular: Atherosclerotic calcification of the carotid siphons. No hyperdense vessel. Skull: No significant scalp swelling, hematoma or calvarial fracture. Moderate hyperostosis frontalis interna, a benign incidental finding. No worrisome or suspicious osseous lesions. Sinuses/Orbits: Paranasal sinuses and mastoid air cells are predominantly clear. Pneumatization of the petrous apices noted incidentally. Orbital structures are unremarkable aside from prior lens extractions. Other: None IMPRESSION: 1. No acute intracranial findings. No significant scalp swelling, hematoma, or calvarial fracture. 2. Mild parenchymal volume loss and chronic microvascular angiopathy. Electronically Signed   By: Lovena Le M.D.   On: 12/11/2019 20:43   CT Cervical Spine Wo Contrast  Result Date: 12/11/2019 CLINICAL DATA:  [place] over chair with positive head strike EXAM: CT CERVICAL SPINE WITHOUT CONTRAST TECHNIQUE: Multidetector CT imaging of the cervical spine was performed without intravenous contrast. Multiplanar CT image reconstructions were also generated. COMPARISON:  Cervical spine radiographs Jan 03, 2006 FINDINGS: Alignment: Preservation of the normal cervical  lordosis without traumatic listhesis. No abnormally widened, perched or jumped facets. Normal alignment of the craniocervical and atlantoaxial articulations. Skull base and vertebrae: No acute fracture. No primary bone lesion or focal pathologic process. Incomplete fusion of the spinous process of C6. Soft tissues and spinal canal: No pre or paravertebral fluid or swelling. No visible canal hematoma. Disc levels: Minimal spondylitic changes. No significant central canal or foraminal stenosis identified within the imaged levels of the spine. Mild-to-moderate atlantodental arthrosis. Upper chest: No acute abnormality in the upper chest or imaged lung apices. Other: Normal thyroid. Cervical carotid atherosclerosis seen bilaterally at the bifurcations. IMPRESSION: 1. No acute fracture or traumatic listhesis of the cervical spine. 2. Minimal spondylitic changes. 3. Cervical carotid atherosclerosis. Electronically Signed   By: Lovena Le M.D.   On: 12/11/2019 20:46   DG Knee Complete 4 Views Left  Result Date: 12/11/2019 CLINICAL DATA:  74 year old female status post fall with pain. EXAM: LEFT KNEE - COMPLETE 4+ VIEW COMPARISON:  01/08/2019. FINDINGS: No joint effusion on the cross-table lateral view. New surgical clips in the medial popliteal region since last year. Chronic tricompartmental degenerative spurring. Osteopenia. No acute osseous abnormality identified. No discrete soft tissue injury. IMPRESSION: No acute fracture or dislocation identified about the left knee. Electronically Signed   By: Genevie Ann M.D.   On: 12/11/2019 19:04    Subjective: Patient seen and examined.  No overnight events.  Waiting to go to rehab at the skilled nursing facility.  Has some pain mostly on the left wrist.   Discharge Exam: Vitals:   12/17/19 2230 12/18/19 0622  BP: (!) 120/52 (!) 126/46  Pulse: 82 75  Resp: 18 16  Temp: 98.1 F (36.7 C) 97.8 F (36.6 C)  SpO2: 100% 100%   Vitals:   12/17/19 2201 12/17/19 2230  12/18/19 0500 12/18/19 0622  BP: 99/61 (!) 120/52  (!) 126/46  Pulse: 80 82  75  Resp: 18 18  16   Temp: 98.1 F (36.7 C) 98.1 F (36.7 C)  97.8 F (36.6 C)  TempSrc: Oral Oral  Oral  SpO2: 100% 100%  100%  Weight:   80.3 kg     General: Pt is alert, awake, not in acute distress Cardiovascular: RRR, S1/S2 +, no rubs, no gallops Respiratory: CTA bilaterally, no wheezing, no rhonchi Abdominal: Soft, NT, ND, bowel sounds + Extremities: no edema, no cyanosis  Left hand on posterior slab, distal neurovascular status intact.    The results of significant diagnostics from this hospitalization (including imaging, microbiology, ancillary and laboratory) are listed below for reference.     Microbiology: Recent Results (from the past 240 hour(s))  SARS CORONAVIRUS 2 (TAT 6-24 HRS) Nasopharyngeal Nasopharyngeal Swab     Status: None   Collection Time: 12/11/19 11:18 PM   Specimen: Nasopharyngeal Swab  Result Value Ref Range Status   SARS Coronavirus 2 NEGATIVE NEGATIVE Final    Comment: (NOTE) SARS-CoV-2 target nucleic acids are NOT DETECTED. The SARS-CoV-2 RNA is generally detectable in upper and lower respiratory specimens during the acute phase of infection. Negative results do not preclude SARS-CoV-2 infection, do not rule out co-infections with other pathogens, and should not be used as the sole basis for treatment or other patient management decisions. Negative results must be combined with clinical observations, patient history, and epidemiological information. The expected result is Negative. Fact Sheet for Patients: SugarRoll.be Fact Sheet for Healthcare Providers: https://www.woods-mathews.com/ This test is not yet approved or cleared by the [place] FDA and  has been authorized for detection and/or diagnosis of SARS-CoV-2 by FDA under an Emergency Use Authorization (EUA). This EUA will remain  in effect (meaning this test can  be used) for the duration of the COVID-19 declaration under Section 56 4(b)(1) of the Act, 21 U.S.C. section 360bbb-3(b)(1), unless the authorization is terminated or revoked sooner. Performed at [facility], [place] [address] [place], [place] [address]   Surgical pcr screen     Status: Abnormal   Collection Time: 12/12/19  9:08 PM   Specimen: Nasal Mucosa; Nasal Swab  Result Value Ref Range Status   MRSA, PCR POSITIVE (A) NEGATIVE Final    Comment: CRITICAL RESULT CALLED TO, READ BACK BY AND VERIFIED WITH: RN MARIA RASING [id number] @2334  THANEY    Staphylococcus aureus POSITIVE (A) NEGATIVE Final    Comment: CRITICAL RESULT CALLED TO, READ BACK BY AND VERIFIED WITH: RN Lupe Carney [id number] @2334  THANEY Performed at [facility] [address] [place], [place] [address]   SARS CORONAVIRUS 2 (TAT 6-24 HRS) Nasopharyngeal Nasopharyngeal Swab     Status: None   Collection Time: 12/16/19 10:56 AM   Specimen: Nasopharyngeal Swab  Result Value Ref Range Status   SARS Coronavirus 2 NEGATIVE NEGATIVE Final    Comment: (NOTE) SARS-CoV-2 target nucleic acids are NOT DETECTED. The SARS-CoV-2 RNA is generally detectable in upper and lower respiratory specimens during the acute phase of infection. Negative results do not preclude SARS-CoV-2 infection, do not rule out co-infections with other pathogens, and should not be used as the sole basis for treatment or other patient management decisions. Negative results must be combined with clinical observations, patient history, and epidemiological information. The expected result is Negative. Fact Sheet for Patients: SugarRoll.be Fact Sheet for Healthcare Providers: https://www.woods-mathews.com/ This test is not yet approved or cleared by the [place] FDA and  has been authorized for detection and/or diagnosis of SARS-CoV-2 by FDA under an Emergency Use Authorization (EUA). This EUA  will remain  in effect (meaning this test can be used) for the duration of the COVID-19 declaration under Section 56 4(b)(1) of the Act, 21 U.S.C. section 360bbb-3(b)(1), unless the authorization is terminated or revoked sooner. Performed at [facility], [place] [address], [place], [place] [address]      Labs: BNP (last 3 results) Recent Labs    12/16/19 0258  BNP 173.6*  Basic Metabolic Panel: Recent Labs  Lab 12/11/19 2117 12/12/19 0349 12/16/19 0258  NA 141 141 137  K 4.3 3.8 3.9  CL 102 104 99  CO2 25 26 28   GLUCOSE 139* 151* 152*  BUN 28* 29* 44*  CREATININE 0.99 1.04* 1.13*  CALCIUM 9.8 9.2 8.9   Liver Function Tests: No results for input(s): AST, ALT, ALKPHOS, BILITOT, PROT, ALBUMIN in the last 168 hours. No results for input(s): LIPASE, AMYLASE in the last 168 hours. No results for input(s): AMMONIA in the last 168 hours. CBC: Recent Labs  Lab 12/11/19 2117 12/12/19 0349 12/16/19 0258  WBC 8.1 6.8 8.3  NEUTROABS 6.2  --   --   HGB 12.2 10.5* 10.3*  HCT 40.4 33.5* 31.8*  MCV 98.3 94.9 94.4  PLT 260 244 259   Cardiac Enzymes: No results for input(s): CKTOTAL, CKMB, CKMBINDEX, TROPONINI in the last 168 hours. BNP: Invalid input(s): POCBNP CBG: Recent Labs  Lab 12/17/19 0733 12/17/19 1139 12/17/19 1643 12/17/19 2100 12/18/19 0802  GLUCAP 139* 129* 153* 137* 149*   D-Dimer No results for input(s): DDIMER in the last 72 hours. Hgb A1c No results for input(s): HGBA1C in the last 72 hours. Lipid Profile No results for input(s): CHOL, HDL, LDLCALC, TRIG, CHOLHDL, LDLDIRECT in the last 72 hours. Thyroid function studies No results for input(s): TSH, T4TOTAL, T3FREE, THYROIDAB in the last 72 hours.  Invalid input(s): FREET3 Anemia work up No results for input(s): VITAMINB12, FOLATE, FERRITIN, TIBC, IRON, RETICCTPCT in the last 72 hours. Urinalysis    Component Value Date/Time   COLORURINE YELLOW 12/12/2019 0045   APPEARANCEUR CLEAR  12/12/2019 0045   LABSPEC 1.016 12/12/2019 0045   PHURINE 5.0 12/12/2019 0045   GLUCOSEU NEGATIVE 12/12/2019 0045   HGBUR NEGATIVE 12/12/2019 0045   BILIRUBINUR NEGATIVE 12/12/2019 0045   KETONESUR NEGATIVE 12/12/2019 0045   PROTEINUR NEGATIVE 12/12/2019 0045   UROBILINOGEN Normal 01/19/2019 0000   NITRITE NEGATIVE 12/12/2019 0045   LEUKOCYTESUR TRACE (A) 12/12/2019 0045   Sepsis Labs Invalid input(s): PROCALCITONIN,  WBC,  LACTICIDVEN Microbiology Recent Results (from the past 240 hour(s))  SARS CORONAVIRUS 2 (TAT 6-24 HRS) Nasopharyngeal Nasopharyngeal Swab     Status: None   Collection Time: 12/11/19 11:18 PM   Specimen: Nasopharyngeal Swab  Result Value Ref Range Status   SARS Coronavirus 2 NEGATIVE NEGATIVE Final    Comment: (NOTE) SARS-CoV-2 target nucleic acids are NOT DETECTED. The SARS-CoV-2 RNA is generally detectable in upper and lower respiratory specimens during the acute phase of infection. Negative results do not preclude SARS-CoV-2 infection, do not rule out co-infections with other pathogens, and should not be used as the sole basis for treatment or other patient management decisions. Negative results must be combined with clinical observations, patient history, and epidemiological information. The expected result is Negative. Fact Sheet for Patients: SugarRoll.be Fact Sheet for Healthcare Providers: https://www.woods-mathews.com/ This test is not yet approved or cleared by the [place] FDA and  has been authorized for detection and/or diagnosis of SARS-CoV-2 by FDA under an Emergency Use Authorization (EUA). This EUA will remain  in effect (meaning this test can be used) for the duration of the COVID-19 declaration under Section 56 4(b)(1) of the Act, 21 U.S.C. section 360bbb-3(b)(1), unless the authorization is terminated or revoked sooner. Performed at [facility], [place] [address] [place],  [place] [address]   Surgical pcr screen     Status: Abnormal   Collection Time: 12/12/19  9:08 PM  Specimen: Nasal Mucosa; Nasal Swab  Result Value Ref Range Status   MRSA, PCR POSITIVE (A) NEGATIVE Final    Comment: CRITICAL RESULT CALLED TO, READ BACK BY AND VERIFIED WITH: RN MARIA RASING [id number] @2334  THANEY    Staphylococcus aureus POSITIVE (A) NEGATIVE Final    Comment: CRITICAL RESULT CALLED TO, READ BACK BY AND VERIFIED WITH: RN Lupe Carney [id number] @2334  THANEY Performed at [facility] [address] [place], [place] [address]   SARS CORONAVIRUS 2 (TAT 6-24 HRS) Nasopharyngeal Nasopharyngeal Swab     Status: None   Collection Time: 12/16/19 10:56 AM   Specimen: Nasopharyngeal Swab  Result Value Ref Range Status   SARS Coronavirus 2 NEGATIVE NEGATIVE Final    Comment: (NOTE) SARS-CoV-2 target nucleic acids are NOT DETECTED. The SARS-CoV-2 RNA is generally detectable in upper and lower respiratory specimens during the acute phase of infection. Negative results do not preclude SARS-CoV-2 infection, do not rule out co-infections with other pathogens, and should not be used as the sole basis for treatment or other patient management decisions. Negative results must be combined with clinical observations, patient history, and epidemiological information. The expected result is Negative. Fact Sheet for Patients: SugarRoll.be Fact Sheet for Healthcare Providers: https://www.woods-mathews.com/ This test is not yet approved or cleared by the [place] FDA and  has been authorized for detection and/or diagnosis of SARS-CoV-2 by FDA under an Emergency Use Authorization (EUA). This EUA will remain  in effect (meaning this test can be used) for the duration of the COVID-19 declaration under Section 56 4(b)(1) of the Act, 21 U.S.C. section 360bbb-3(b)(1), unless the authorization is terminated or revoked sooner. Performed at [facility], [place] [address] [place], [place] [address]      Time coordinating discharge:  35 minutes  SIGNED:   Barb Merino, MD  [facility] 12/18/2019, 10:45 AM

## 2019-12-18 NOTE — Plan of Care (Signed)
  Problem: Education: Goal: Knowledge of General Education information will improve Description: Including pain rating scale, medication(s)/side effects and non-pharmacologic comfort measures Outcome: Adequate for Discharge   Problem: Coping: Goal: Level of anxiety will decrease Outcome: Adequate for Discharge   Problem: Elimination: Goal: Will not experience complications related to bowel motility Outcome: Adequate for Discharge  Patient verbalizes strategies (drinking prune juice, using miralax while on opiate pain meds, drimking water) to keep bowels regular.  Problem: Pain Managment: Goal: General experience of comfort will improve Outcome: Adequate for Discharge  Patient denies pain at this time.  Problem: Activity: Goal: Risk for activity intolerance will decrease Outcome: Adequate for Discharge  Patient up to ithout c/o SOB or fatigue.  Problem: Clinical Measurements: Goal: Will remain free from infection Outcome: Adequate for Discharge  Patient afebrile.

## 2019-12-23 ENCOUNTER — Inpatient Hospital Stay: Payer: Medicare Other | Admitting: Hematology

## 2019-12-23 ENCOUNTER — Inpatient Hospital Stay: Payer: Medicare Other

## 2019-12-26 DIAGNOSIS — S5290XA Unspecified fracture of unspecified forearm, initial encounter for closed fracture: Secondary | ICD-10-CM | POA: Insufficient documentation

## 2020-01-07 ENCOUNTER — Encounter: Payer: Self-pay | Admitting: Cardiovascular Disease

## 2020-01-07 ENCOUNTER — Other Ambulatory Visit: Payer: Self-pay

## 2020-01-07 ENCOUNTER — Ambulatory Visit (INDEPENDENT_AMBULATORY_CARE_PROVIDER_SITE_OTHER): Payer: Medicare Other | Admitting: Cardiovascular Disease

## 2020-01-07 VITALS — BP 132/74 | HR 88 | Ht 62.0 in | Wt 172.6 lb

## 2020-01-07 DIAGNOSIS — I480 Paroxysmal atrial fibrillation: Secondary | ICD-10-CM | POA: Diagnosis not present

## 2020-01-07 DIAGNOSIS — Z951 Presence of aortocoronary bypass graft: Secondary | ICD-10-CM

## 2020-01-07 DIAGNOSIS — E785 Hyperlipidemia, unspecified: Secondary | ICD-10-CM

## 2020-01-07 DIAGNOSIS — I5042 Chronic combined systolic (congestive) and diastolic (congestive) heart failure: Secondary | ICD-10-CM | POA: Diagnosis not present

## 2020-01-07 DIAGNOSIS — I214 Non-ST elevation (NSTEMI) myocardial infarction: Secondary | ICD-10-CM

## 2020-01-07 DIAGNOSIS — I2510 Atherosclerotic heart disease of native coronary artery without angina pectoris: Secondary | ICD-10-CM | POA: Diagnosis not present

## 2020-01-07 LAB — HEPATIC FUNCTION PANEL
ALT: 34 IU/L — ABNORMAL HIGH (ref 0–32)
AST: 25 IU/L (ref 0–40)
Albumin: 4.1 g/dL (ref 3.7–4.7)
Alkaline Phosphatase: 125 IU/L — ABNORMAL HIGH (ref 39–117)
Bilirubin Total: <0.2 mg/dL (ref 0.0–1.2)
Bilirubin, Direct: 0.06 mg/dL (ref 0.00–0.40)
Total Protein: 6.5 g/dL (ref 6.0–8.5)

## 2020-01-07 LAB — LIPID PANEL
Chol/HDL Ratio: 2.6 ratio (ref 0.0–4.4)
Cholesterol, Total: 117 mg/dL (ref 100–199)
HDL: 45 mg/dL (ref >39)
LDL Chol Calc (NIH): 45 mg/dL (ref 0–99)
Triglycerides: 163 mg/dL — ABNORMAL HIGH (ref 0–149)
VLDL Cholesterol Cal: 27 mg/dL (ref 5–40)

## 2020-01-07 MED ORDER — FUROSEMIDE 40 MG PO TABS: 40.0000 mg | ORAL_TABLET | Freq: Every day | ORAL | 3 refills | Status: DC

## 2020-01-07 NOTE — Patient Instructions (Addendum)
Medication Instructions:  Your physician has recommended you make the following change in your medication:  DECREASE Furosemide (Lasix) to 40 mg once daily   *If you need a refill on your cardiac medications before your next appointment, please call your pharmacy*   Lab Work: TODAY - cholesterol, liver panel If you have labs (blood work) drawn today and your tests are completely normal, you will receive your results only by: [name] MyChart Message (if you have MyChart) OR . A paper copy in the mail If you have any lab test that is abnormal or we need to change your treatment, we will call you to review the results.   Testing/Procedures: Your physician has requested that you have an echocardiogram in 6 months on Tuesday Nov. 9 at 10:35 am. Please arrive at 10:05 am for your appointment. Echocardiography is a painless test that uses sound waves to create images of your heart. It provides your doctor with information about the size and shape of your heart and how well your heart's chambers and valves are working. This procedure takes approximately one hour. There are no restrictions for this procedure.    Follow-Up: At [facility], you and your health needs are our priority.  As part of our continuing mission to provide you with exceptional heart care, we have created designated Provider Care Teams.  These Care Teams include your primary Cardiologist (physician) and Advanced Practice Providers (APPs -  Physician Assistants and Nurse Practitioners) who all work together to provide you with the care you need, when you need it.   Your next appointment:   6 month(s)  The format for your next appointment:   In Person  Provider:   You may see Mertie Moores, MD or one of the following Advanced Practice Providers on your designated Care Team:    Richardson Dopp, PA-C  [place], [place]
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
## 2020-02-19 ENCOUNTER — Telehealth: Payer: Self-pay | Admitting: *Deleted

## 2020-02-19 NOTE — Telephone Encounter (Signed)
Contacted [facility] - spoke with Joy (HN). Patient has upcoming appts for Lab/MD on 6/28. Wanted to clarify patient request to have labs drawn by Home Health RN if no MD appt. Since patient has MD appt, labs will be done at [facility]. Joy informed of times of appointment and states will have transportation arranged. Joy requests that she or Willette be contacted for patient's future transportation needs as they coordinate this for patient appointments.

## 2020-02-24 ENCOUNTER — Other Ambulatory Visit: Payer: Self-pay

## 2020-02-24 ENCOUNTER — Inpatient Hospital Stay: Payer: Medicare Other | Attending: Hematology

## 2020-02-24 ENCOUNTER — Inpatient Hospital Stay (HOSPITAL_BASED_OUTPATIENT_CLINIC_OR_DEPARTMENT_OTHER): Payer: Medicare Other | Admitting: Hematology

## 2020-02-24 VITALS — BP 152/74 | HR 77 | Temp 98.4°F | Resp 18 | Ht 62.0 in | Wt 176.4 lb

## 2020-02-24 DIAGNOSIS — D509 Iron deficiency anemia, unspecified: Secondary | ICD-10-CM

## 2020-02-24 DIAGNOSIS — J45909 Unspecified asthma, uncomplicated: Secondary | ICD-10-CM | POA: Insufficient documentation

## 2020-02-24 DIAGNOSIS — E669 Obesity, unspecified: Secondary | ICD-10-CM | POA: Insufficient documentation

## 2020-02-24 DIAGNOSIS — G4730 Sleep apnea, unspecified: Secondary | ICD-10-CM | POA: Insufficient documentation

## 2020-02-24 DIAGNOSIS — M109 Gout, unspecified: Secondary | ICD-10-CM | POA: Insufficient documentation

## 2020-02-24 DIAGNOSIS — E785 Hyperlipidemia, unspecified: Secondary | ICD-10-CM | POA: Insufficient documentation

## 2020-02-24 DIAGNOSIS — M1990 Unspecified osteoarthritis, unspecified site: Secondary | ICD-10-CM | POA: Diagnosis not present

## 2020-02-24 DIAGNOSIS — E538 Deficiency of other specified B group vitamins: Secondary | ICD-10-CM

## 2020-02-24 DIAGNOSIS — E039 Hypothyroidism, unspecified: Secondary | ICD-10-CM | POA: Diagnosis not present

## 2020-02-24 DIAGNOSIS — D649 Anemia, unspecified: Secondary | ICD-10-CM

## 2020-02-24 DIAGNOSIS — Z7984 Long term (current) use of oral hypoglycemic drugs: Secondary | ICD-10-CM | POA: Insufficient documentation

## 2020-02-24 DIAGNOSIS — M1380 Other specified arthritis, unspecified site: Secondary | ICD-10-CM | POA: Diagnosis not present

## 2020-02-24 DIAGNOSIS — Z87891 Personal history of nicotine dependence: Secondary | ICD-10-CM | POA: Diagnosis not present

## 2020-02-24 DIAGNOSIS — Z79899 Other long term (current) drug therapy: Secondary | ICD-10-CM | POA: Diagnosis not present

## 2020-02-24 DIAGNOSIS — E119 Type 2 diabetes mellitus without complications: Secondary | ICD-10-CM | POA: Diagnosis not present

## 2020-02-24 DIAGNOSIS — F419 Anxiety disorder, unspecified: Secondary | ICD-10-CM | POA: Diagnosis not present

## 2020-02-24 DIAGNOSIS — I10 Essential (primary) hypertension: Secondary | ICD-10-CM | POA: Insufficient documentation

## 2020-02-24 LAB — CMP (CANCER CENTER ONLY)
ALT: 21 U/L (ref 0–44)
AST: 16 U/L (ref 15–41)
Albumin: 3.8 g/dL (ref 3.5–5.0)
Alkaline Phosphatase: 105 U/L (ref 38–126)
Anion gap: 11 (ref 5–15)
BUN: 18 mg/dL (ref 8–23)
CO2: 23 mmol/L (ref 22–32)
Calcium: 9 mg/dL (ref 8.9–10.3)
Chloride: 108 mmol/L (ref 98–111)
Creatinine: 0.84 mg/dL (ref 0.44–1.00)
GFR, Est AFR Am: >60 mL/min (ref >60)
GFR, Estimated: >60 mL/min (ref >60)
Glucose, Bld: 151 mg/dL — ABNORMAL HIGH (ref 70–99)
Potassium: 4.2 mmol/L (ref 3.5–5.1)
Sodium: 142 mmol/L (ref 135–145)
Total Bilirubin: <0.2 mg/dL — ABNORMAL LOW (ref 0.3–1.2)
Total Protein: 6.8 g/dL (ref 6.5–8.1)

## 2020-02-24 LAB — CBC WITH DIFFERENTIAL/PLATELET
Abs Immature Granulocytes: 0.01 10*3/uL (ref 0.00–0.07)
Basophils Absolute: 0 10*3/uL (ref 0.0–0.1)
Basophils Relative: 1 %
Eosinophils Absolute: 0.2 10*3/uL (ref 0.0–0.5)
Eosinophils Relative: 2 %
HCT: 36.7 % (ref 36.0–46.0)
Hemoglobin: 12.1 g/dL (ref 12.0–15.0)
Immature Granulocytes: 0 %
Lymphocytes Relative: 21 %
Lymphs Abs: 1.6 10*3/uL (ref 0.7–4.0)
MCH: 30.6 pg (ref 26.0–34.0)
MCHC: 33 g/dL (ref 30.0–36.0)
MCV: 92.7 fL (ref 80.0–100.0)
Monocytes Absolute: 0.4 10*3/uL (ref 0.1–1.0)
Monocytes Relative: 5 %
Neutro Abs: 5.7 10*3/uL (ref 1.7–7.7)
Neutrophils Relative %: 71 %
Platelets: 236 10*3/uL (ref 150–400)
RBC: 3.96 MIL/uL (ref 3.87–5.11)
RDW: 14.1 % (ref 11.5–15.5)
WBC: 8 10*3/uL (ref 4.0–10.5)
nRBC: 0 % (ref 0.0–0.2)

## 2020-02-24 LAB — IRON AND TIBC
Iron: 64 ug/dL (ref 41–142)
Saturation Ratios: 25 % (ref 21–57)
TIBC: 256 ug/dL (ref 236–444)
UIBC: 192 ug/dL (ref 120–384)

## 2020-02-24 LAB — VITAMIN B12: Vitamin B-12: 937 pg/mL — ABNORMAL HIGH (ref 180–914)

## 2020-02-24 LAB — FERRITIN: Ferritin: 75 ng/mL (ref 11–307)

## 2020-02-24 NOTE — Progress Notes (Signed)
[name]    HEMATOLOGY/ONCOLOGY CLINIC NOTE  Date of Service: 02/24/2020  Patient Care Team: [name], MD as PCP - General (Family Medicine) Nahser, Wonda Cheng, MD as PCP - Cardiology (Cardiology)  CHIEF COMPLAINTS/PURPOSE OF CONSULTATION:  Normocytic Anemia  HISTORY OF PRESENTING ILLNESS:  Megan [name] is a wonderful 74 y.o. female who has been referred to [name] by Dr .[name], MD  for evaluation and management of normocytic anemia.  Patient has a h/o inflammatory arthritis, hypothyroidism, iron deficiency, HTN, DM2, glaucoma, asthma . Patient had recent labs with her PCP on 08/13/2018 which showed WBC 7.9k, HCT 30.4, MCV 86, platelets of 349k. Nl FT4 levels. Ferritin 32.  Interval History:   Megan NYGREN returns today for management and evaluation of her anemia. The patient's last visit with [name] was on 06/24/2019. The pt reports that she is doing well overall.  The pt reports that she lost her balance, fell, and broke her left ulna and radius in mid-April. She is currently in assisted living and completing rehab for her injury. Within the last two weeks she began to experience swelling and joint discomfort hin her left hand. She has had Gout previously.   Lab results today (02/24/20) of CBC w/diff and CMP is as follows: all values are WNL except for Glucose at 151, Total Bilirubin at <0.2. 02/24/2020 Ferritin at 75 02/24/2020 Vitamin B12 at 937 02/24/2020 Iron Panel is as follows: all values are WNL  On review of systems, pt reports left hand swelling/joint stiffness and denies any other symptoms.    MEDICAL HISTORY:  Past Medical History:  Diagnosis Date  . Allergy    bee stings  . Anxiety   . Asthmatic bronchitis   . Broken arm 1957/2008   right  . Cataract    had surgery   . Depression   . Diabetes mellitus without complication ([place])    prediabetes diet controlled  . Diverticulosis   . GERD (gastroesophageal reflux disease)   . Glaucoma    BIL   . Hemorrhoids   . Hx of adenomatous colonic polyps 12/02/2010  . Hyperlipidemia   . Hypertension   . Hypothyroidism 05/2018  . Inflammatory arthritis   . Knee bursitis   . Migraines   . Obesity   . Osteoarthritis   . Sleep apnea     SURGICAL HISTORY: Past Surgical History:  Procedure Laterality Date  . CARPAL TUNNEL RELEASE Left    left wrist  . CATARACT EXTRACTION W/ INTRAOCULAR LENS  IMPLANT, BILATERAL Bilateral    1999 left, 2008 right  . COLONOSCOPY    . CORONARY ARTERY BYPASS GRAFT N/A 05/15/2019   Procedure: CORONARY ARTERY BYPASS GRAFTING (CABG) times two, left internal mammary artery to left anterior descending artery and left greater saphenous vein to posterior descending artery, left sapheouns vein harvested endoscopically ;  Surgeon: Grace Isaac, MD;  Location: [facility];  Service: Open Heart Surgery;  Laterality: N/A;  . EXCISION MORTON'S NEUROMA Left 1978   left foot  . GLAUCOMA SURGERY Bilateral    and laser surgery, 2004 left, 2008 right  . LEFT HEART CATH AND CORONARY ANGIOGRAPHY N/A 05/13/2019   Procedure: LEFT HEART CATH AND CORONARY ANGIOGRAPHY;  Surgeon: Martinique, Peter M, MD;  Location: [facility];  Service: Cardiovascular;  Laterality: N/A;  . ORIF RADIAL FRACTURE Left 12/13/2019   Procedure: OPEN REDUCTION INTERNAL FIXATION (ORIF) FOREARM FRACTURE;  Surgeon: Iran Planas, MD;  Location: [facility];  Service: Orthopedics;  Laterality: Left;  .  PARTIAL HYSTERECTOMY  1991   Left an ovary  . POLYPECTOMY    . TEE WITHOUT CARDIOVERSION N/A 05/15/2019   Procedure: TRANSESOPHAGEAL ECHOCARDIOGRAM (TEE);  Surgeon: Grace Isaac, MD;  Location: [facility];  Service: Open Heart Surgery;  Laterality: N/A;  . TOENAIL AVULSION Right    big toe    SOCIAL HISTORY: Social History   Socioeconomic History  . Marital status: Married    Spouse name: Lynnae Sandhoff  . Number of children: 0  . Years of education: Not on file  . Highest education level: Associate degree:  occupational, Hotel manager, or vocational program  Occupational History  . Occupation: retired  Tobacco Use  . Smoking status: Former Smoker    Types: Cigarettes    Quit date: 08/30/1971    Years since quitting: 48.5  . Smokeless tobacco: Never Used  Vaping Use  . Vaping Use: Never used  Substance and Sexual Activity  . Alcohol use: No    Alcohol/week: 0.0 standard drinks  . Drug use: No  . Sexual activity: Not on file  Other Topics Concern  . Not on file  Social History Narrative   She is widowed, no children, retired.  Lives in a one story home.  Retired from Geologist, engineering for [employer].   Social Determinants of Health   Financial Resource Strain:   . Difficulty of Paying Living Expenses:   Food Insecurity:   . Worried About Charity fundraiser in the Last Year:   . Arboriculturist in the Last Year:   Transportation Needs:   . Film/video editor (Medical):   [name] Lack of Transportation (Non-Medical):   Physical Activity:   . Days of Exercise per Week:   . Minutes of Exercise per Session:   Stress:   . Feeling of Stress :   Social Connections:   . Frequency of Communication with Friends and Family:   . Frequency of Social Gatherings with Friends and Family:   . Attends Religious Services:   . Active Member of Clubs or Organizations:   . Attends Archivist Meetings:   [name] Marital Status:   Intimate Partner Violence:   . Fear of Current or Ex-Partner:   . Emotionally Abused:   [name] Physically Abused:   . Sexually Abused:     FAMILY HISTORY: Family History  Problem Relation Age of Onset  . Colon polyps Maternal Grandfather   . Bone cancer Maternal Grandfather   . Hypertension Mother   . Diabetes Mother   . Parkinson's disease Mother   . Hypertension Father   . Diabetes Father   . Heart Problems Father   . Asthma Brother   . Diabetes Brother   . Breast cancer Brother        stage 4, both breast  . Obesity Other   . Diabetes Sister        x 2  . COPD  Sister   . Asthma Sister   . Clotting disorder Sister        blood count  . Rectal cancer Neg Hx   . Stomach cancer Neg Hx   . Esophageal cancer Neg Hx     ALLERGIES:  is allergic to aspirin, bee pollen, codeine, fish oil, sulfa antibiotics, hornet venom, iron, pantoprazole, tape, camphor, lisinopril, penicillins, sulfasalazine, and vicodin [hydrocodone-acetaminophen].  MEDICATIONS:  Current Outpatient Medications  Medication Sig Dispense Refill  . acetaminophen (TYLENOL) 650 MG CR tablet Take 650 mg by mouth every 8 (eight) hours.     [name]  allopurinol (ZYLOPRIM) 100 MG tablet Take 1 tablet (100 mg total) by mouth daily. 30 tablet 0  . atorvastatin (LIPITOR) 80 MG tablet Take 1 tablet (80 mg total) by mouth daily at 6 PM. 30 tablet 2  . calcium carbonate (TUMS - DOSED IN MG ELEMENTAL CALCIUM) 500 MG chewable tablet Chew 1-2 tablets by mouth as needed for indigestion or heartburn.    . clopidogrel (PLAVIX) 75 MG tablet Take 1 tablet (75 mg total) by mouth daily. 30 tablet 2  . diclofenac sodium (VOLTAREN) 1 % GEL Apply 2 gm topically to left knee (Patient taking differently: Apply 2 gm topically to Right & Left Shoulder) 100 g 0  . EPINEPHrine 0.3 mg/0.3 mL IJ SOAJ injection Inject 0.3 mg into the muscle once as needed for anaphylaxis.     . famotidine (PEPCID) 20 MG tablet Take 20 mg by mouth 2 (two) times daily.    . furosemide (LASIX) 40 MG tablet Take 1 tablet (40 mg total) by mouth daily. 90 tablet 3  . gabapentin (NEURONTIN) 100 MG capsule Take 100 mg by mouth every 8 (eight) hours as needed (for pain).    [name] levothyroxine (SYNTHROID) 75 MCG tablet Take 75 mcg by mouth daily before breakfast.    . Lidocaine (ASPERCREME LIDOCAINE) 4 % PTCH Place 1 patch onto the skin daily. APPLY TOPICALLY TO LOWER BACK ONCE DAILY FOR PAIN 30 patch 0  . LORazepam (ATIVAN) 0.5 MG tablet Take 0.5 mg by mouth every 8 (eight) hours.    [name] losartan (COZAAR) 25 MG tablet Take 0.5 tablets (12.5 mg total) by mouth  daily. 45 tablet 3  . Magnesium Oxide 400 MG CAPS Take 1 capsule (400 mg total) by mouth daily. 90 capsule 3  . meclizine (ANTIVERT) 25 MG tablet Take 25 mg by mouth 3 (three) times daily as needed for dizziness.     . Melatonin 3 MG TABS Take 1 tablet (3 mg total) by mouth at bedtime. FOR INSOMNIA 30 tablet 0  . metFORMIN (GLUCOPHAGE) 500 MG tablet Take 1 tablet (500 mg total) by mouth 2 (two) times daily with a meal. 60 tablet 0  . metoprolol succinate (TOPROL XL) 25 MG 24 hr tablet Take 1 tablet (25 mg total) by mouth daily. 90 tablet 3  . Multiple Vitamins-Iron (MULTIVITAMINS WITH IRON) TABS tablet Take 1 tablet by mouth daily. 100 tablet 1  . ondansetron (ZOFRAN) 4 MG tablet Take 1 tablet (4 mg total) by mouth every 6 (six) hours as needed for nausea or vomiting. GIVE 1 TABLET BY MOUTH EVERY 6 HOURS AS NEEDED FOR NAUSEA/VOMITING 20 tablet 0  . ONETOUCH VERIO test strip USE TO TEST BLOOD GLUCOSE ONCE DAILY 100 each 0  . polyethylene glycol (MIRALAX / GLYCOLAX) 17 g packet Take 17 g by mouth daily. (Patient taking differently: Take 17 g by mouth daily as needed. ) 14 each 0  . polyvinyl alcohol (LIQUIFILM TEARS) 1.4 % ophthalmic solution Place 1 drop into both eyes every 4 (four) hours as needed for dry eyes. 15 mL 0  . Pyridoxine HCl (VITAMIN B6 PO) Take 1 tablet by mouth daily.    [name] spironolactone (ALDACTONE) 25 MG tablet Take 12.5 mg by mouth at bedtime.     . vitamin B-12 (CYANOCOBALAMIN) 1000 MCG tablet 1 tab by orally daily 30 tablet 0  . Vitamin D, Ergocalciferol, (DRISDOL) 1.25 MG (50000 UNIT) CAPS capsule Take 50,000 Units by mouth every Thursday.     No current facility-administered medications for  this visit.    REVIEW OF SYSTEMS:   A 10+ POINT REVIEW OF SYSTEMS WAS OBTAINED including neurology, dermatology, psychiatry, cardiac, respiratory, lymph, extremities, GI, GU, Musculoskeletal, constitutional, breasts, reproductive, HEENT.  All pertinent positives are noted in the HPI.  All  others are negative.   PHYSICAL EXAMINATION: ECOG PERFORMANCE STATUS: 2 - Symptomatic, <50% confined to bed  Vitals:   02/24/20 1451  BP: (!) 152/74  Pulse: 77  Resp: 18  Temp: 98.4 F (36.9 C)  SpO2: 100%   Filed Weights   02/24/20 1451  Weight: 176 lb 6.4 oz (80 kg)   .Body mass index is 32.26 kg/m.  Exam was given in a chair   GENERAL:alert, in no acute distress and comfortable SKIN: no acute rashes, no significant lesions EYES: conjunctiva are pink and non-injected, sclera anicteric OROPHARYNX: MMM, no exudates, no oropharyngeal erythema or ulceration NECK: supple, no JVD LYMPH:  no palpable lymphadenopathy in the cervical, axillary or inguinal regions LUNGS: clear to auscultation b/l with normal respiratory effort HEART: regular rate & rhythm ABDOMEN:  normoactive bowel sounds , non tender, not distended. No palpable hepatosplenomegaly.  Extremity: no pedal edema PSYCH: alert & oriented x 3 with fluent speech NEURO: no focal motor/sensory deficits  LABORATORY DATA:  I have reviewed the data as listed  . CBC Latest Ref Rng & Units 02/24/2020 12/16/2019 12/12/2019  WBC 4.0 - 10.5 K/uL 8.0 8.3 6.8  Hemoglobin 12.0 - 15.0 g/dL 12.1 10.3(L) 10.5(L)  Hematocrit 36 - 46 % 36.7 31.8(L) 33.5(L)  Platelets 150 - 400 K/uL 236 259 244   . CMP Latest Ref Rng & Units 02/24/2020 01/07/2020 12/16/2019  Glucose 70 - 99 mg/dL 151(H) - 152(H)  BUN 8 - 23 mg/dL 18 - 44(H)  Creatinine 0.44 - 1.00 mg/dL 0.84 - 1.13(H)  Sodium 135 - 145 mmol/L 142 - 137  Potassium 3.5 - 5.1 mmol/L 4.2 - 3.9  Chloride 98 - 111 mmol/L 108 - 99  CO2 22 - 32 mmol/L 23 - 28  Calcium 8.9 - 10.3 mg/dL 9.0 - 8.9  Total Protein 6.5 - 8.1 g/dL 6.8 6.5 -  Total Bilirubin 0.3 - 1.2 mg/dL <0.2(L) <0.2 -  Alkaline Phos 38 - 126 U/L 105 125(H) -  AST 15 - 41 U/L 16 25 -  ALT 0 - 44 U/L 21 34(H) -   . Lab Results  Component Value Date   IRON 64 02/24/2020   TIBC 256 02/24/2020   IRONPCTSAT 25 02/24/2020    (Iron and TIBC)  Lab Results  Component Value Date   FERRITIN 75 02/24/2020    RADIOGRAPHIC STUDIES: I have personally reviewed the radiological images as listed and agreed with the findings in the report. No results found.  ASSESSMENT & PLAN:   74 y.o. female with   1) Normocytic Anemia Likely multifactorial.   Primarily anemia of chronic disease from b/l leg swelling and chronic venous stasis dermatitis. - patient recommended to f/u with PCP to optimize mx of her pedal edema and venous stasis dermatitis and wound care considerations. Some iron deficiency anemia Normal LDH - no evidence of hemolysis 09/04/18 Sed rate at 66 myeloma panel - no evidence of M spike  PLAN: -Discussed pt labwork today, 02/24/20; no anemia, blood counts and chemistries are nml; Ferritin, Vitamin B12, & Iron Panel are  WNL -Goal Ferritin >100, Ferritin today at 75. Continuing to slowly trend down. Pt has had a recent blood loss event.  -Pt has difficulty tolerating oral Iron.  Offered pt option to repeat IV Iron. Will reconsider with next visit.  -Pt may be experiencing a gout flare in her left hand. Recommend pt discuss left hand symptoms with Orthopedist.  -Continue SL 1045mcg Vitamin B12 daily  -Will see back in 6 months with labs   FOLLOW UP: RTC with Dr Irene Limbo with labs in 6 months   The total time spent in the appt was 20 minutes and more than 50% was on counseling and direct patient cares.  All of the patient's questions were answered with apparent satisfaction. The patient knows to call the clinic with any problems, questions or concerns.    Sullivan Lone MD [place] AAHIVMS [facility] Hematology/Oncology Physician [facility]  (Office):       [phone] (Work cell):  [phone] (Fax):           [phone]  02/24/2020 3:25 PM  I, Yevette Edwards, am acting as a scribe for Dr. Sullivan Lone.   .I have reviewed the above documentation for accuracy and completeness, and I agree  with the above. Brunetta Genera MD

## 2020-03-16 ENCOUNTER — Telehealth: Payer: Self-pay | Admitting: Hematology

## 2020-03-16 NOTE — Telephone Encounter (Signed)
Scheduled per 06/28 los, patient has been called and notified.  

## 2020-05-25 IMAGING — CR DG KNEE COMPLETE 4+V*L*
4 series · 4 of 4 positions shown · non-contrast
Comparison: 01/08/2019.

CLINICAL DATA: 74-year-old female status post fall with pain.

EXAM:
LEFT KNEE - COMPLETE 4+ VIEW

[knee ap]
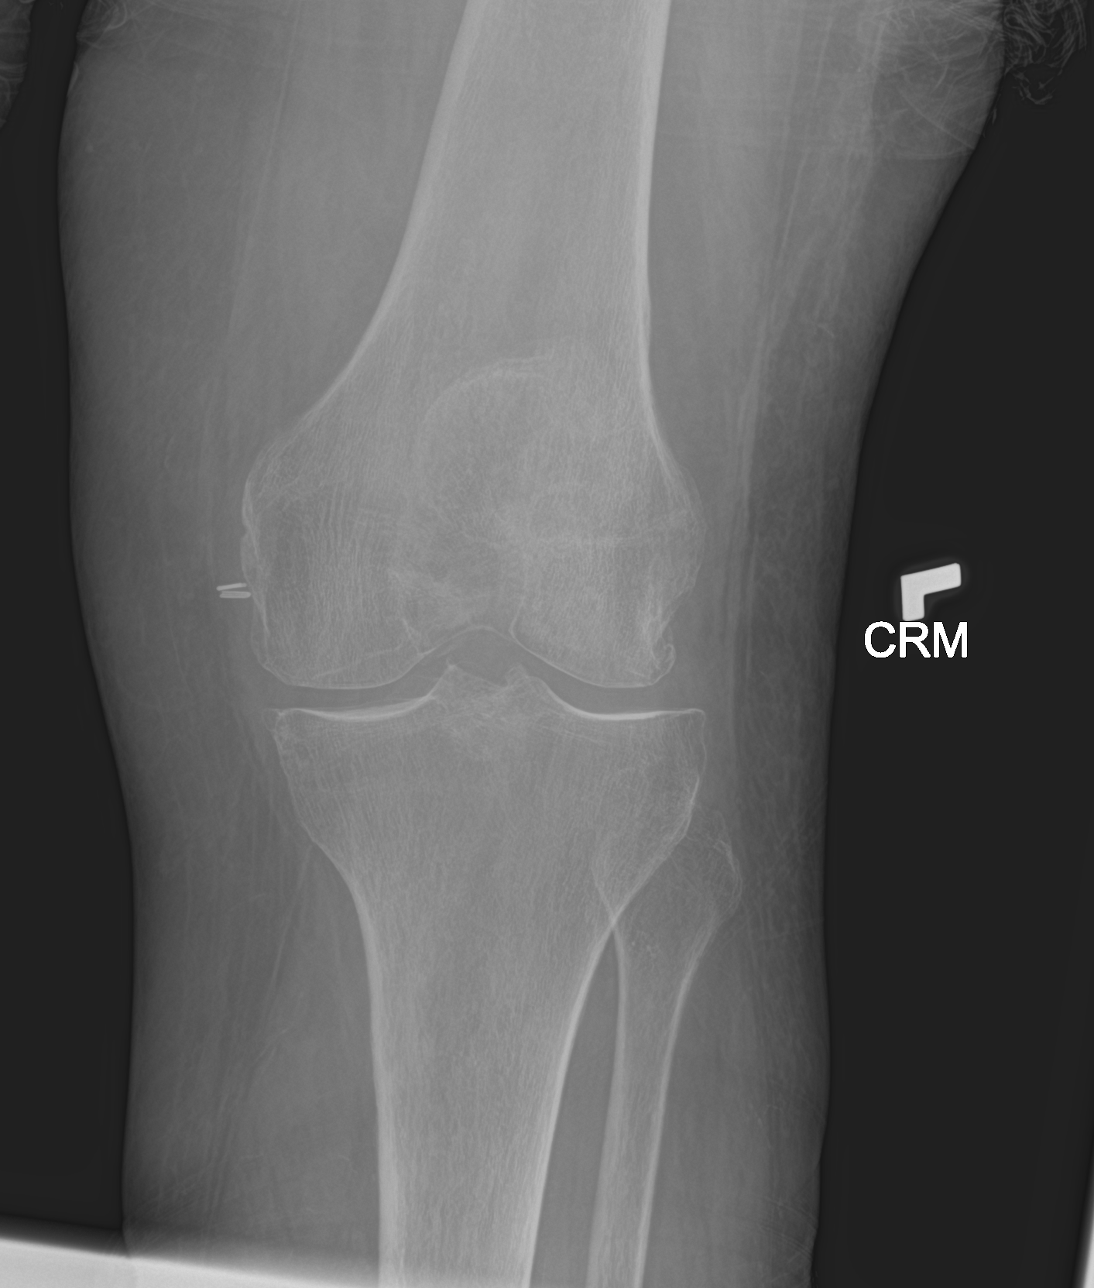

[knee lat]
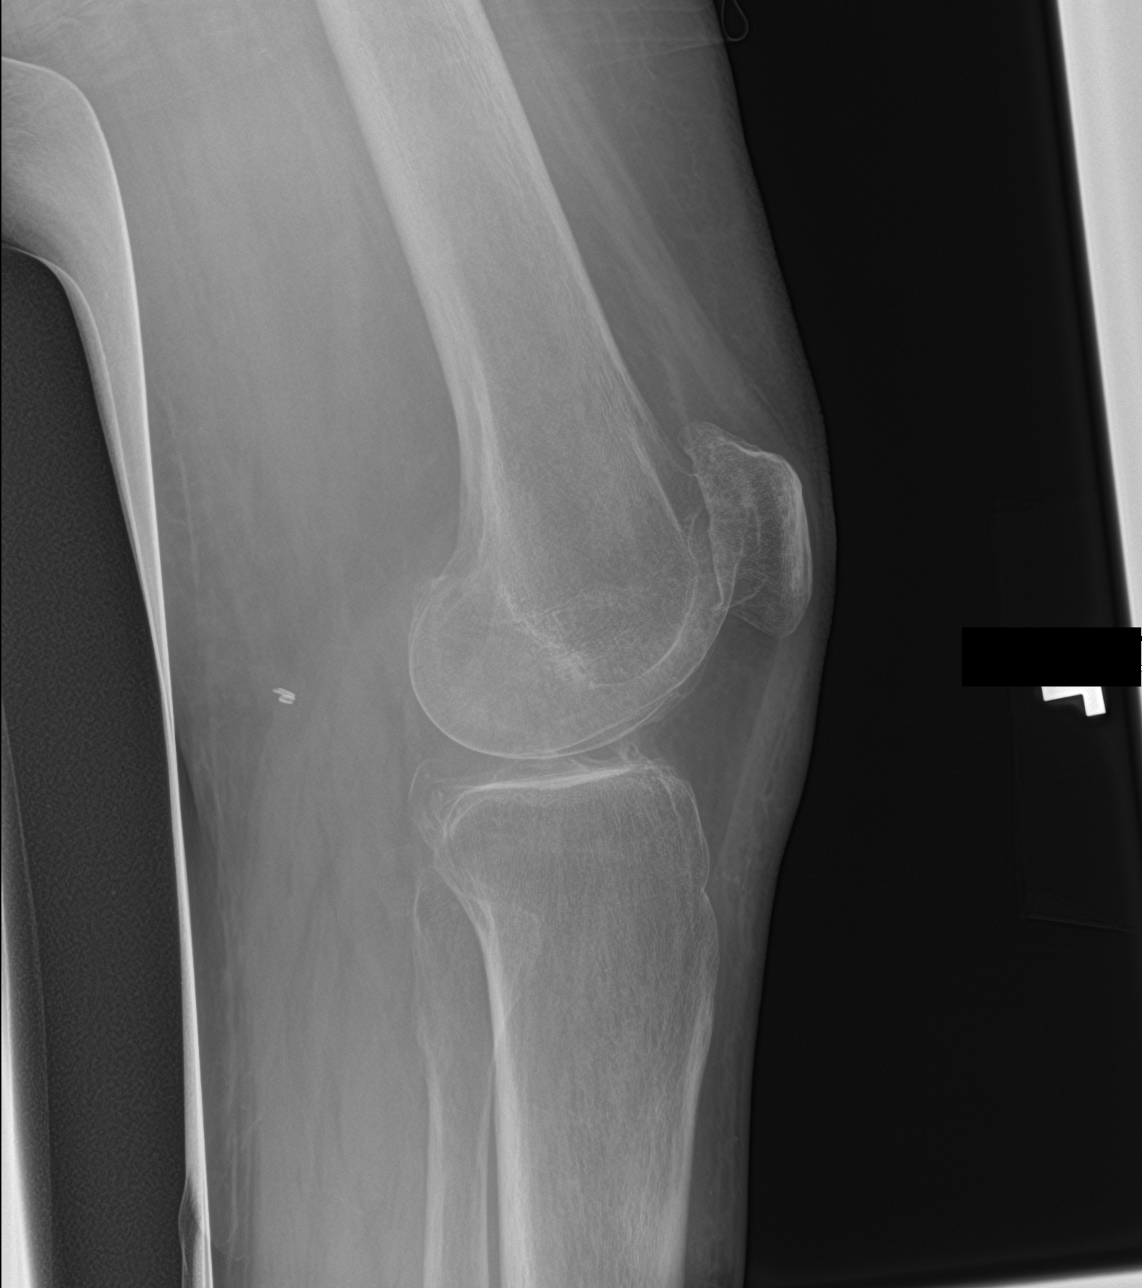

[knee obl (1 of 2)]
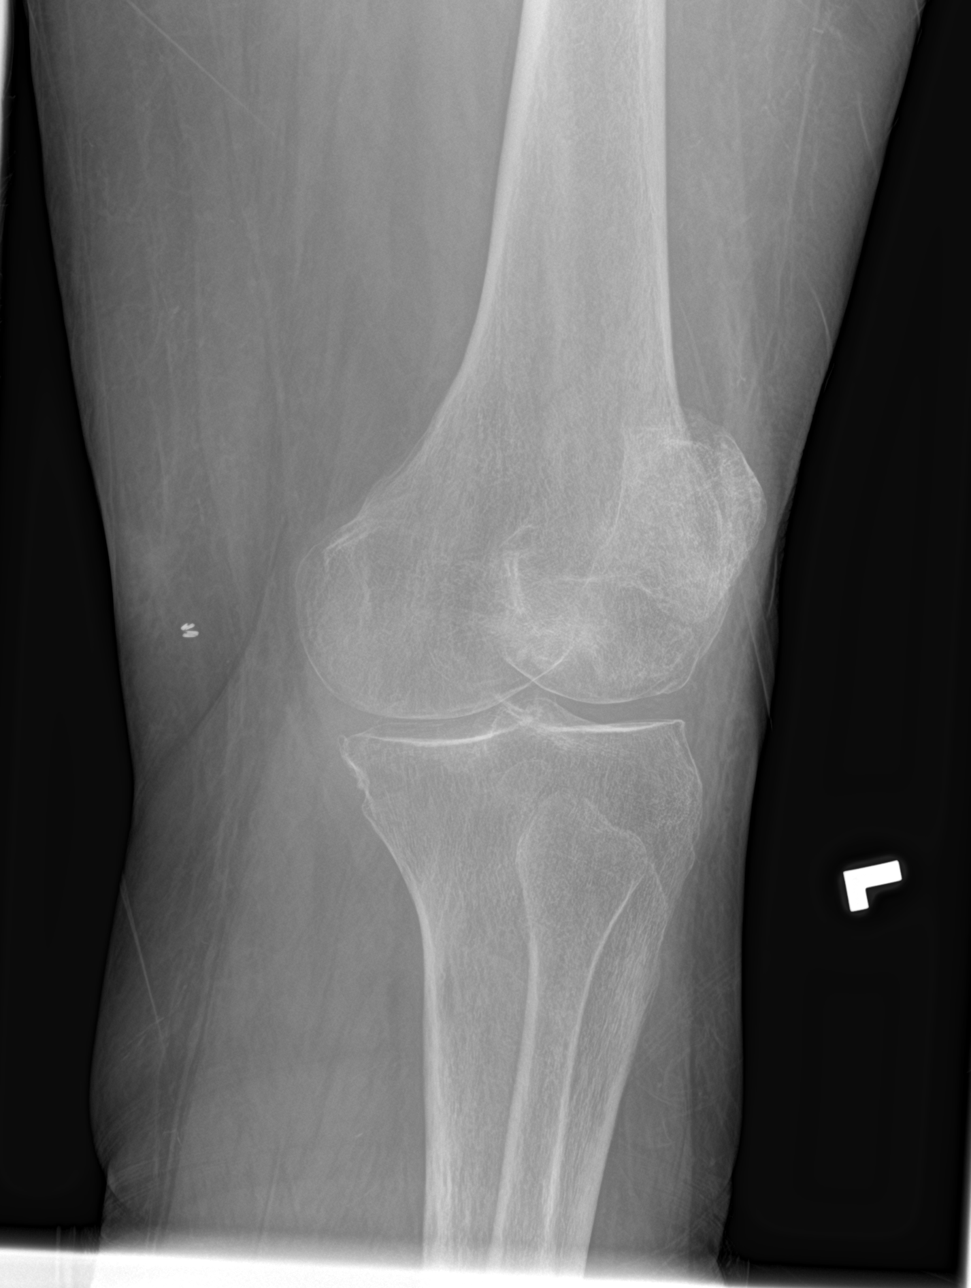

[knee obl (2 of 2)]
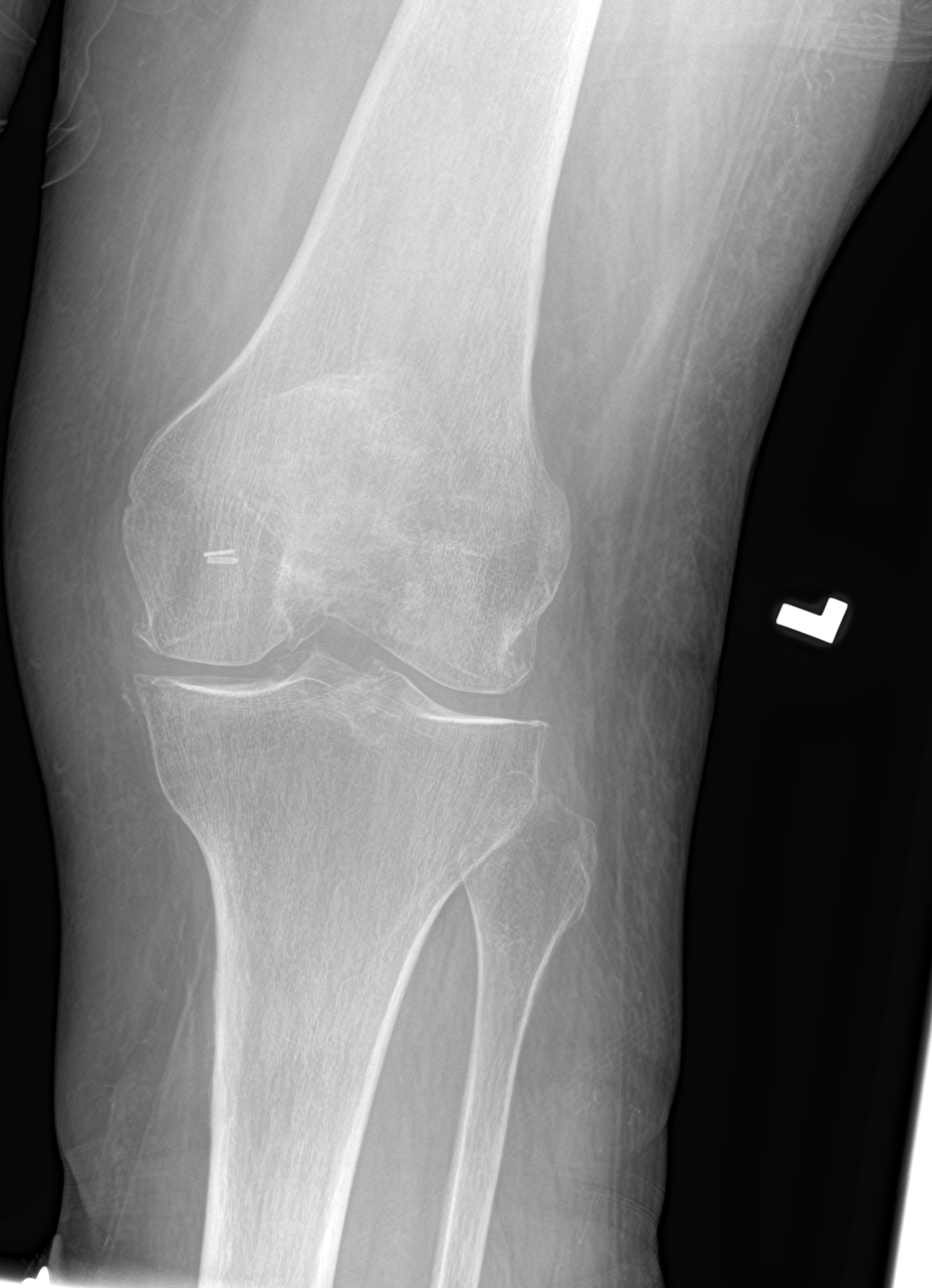

[4 of 4 positions shown; findings below may reference images not displayed]

FINDINGS: No joint effusion on the cross-table lateral view. New surgical
clips in the medial popliteal region since last year. Chronic
tricompartmental degenerative spurring. Osteopenia. No acute osseous
abnormality identified. No discrete soft tissue injury.
IMPRESSION: No acute fracture or dislocation identified about the left knee.

## 2020-05-25 IMAGING — CR DG FOREARM 2V*L*
2 series · 2 of 2 positions shown · non-contrast
Comparison: None.

CLINICAL DATA: 74-year-old female status post fall with pain.

EXAM:
LEFT FOREARM - 2 VIEW

[forearm ap]
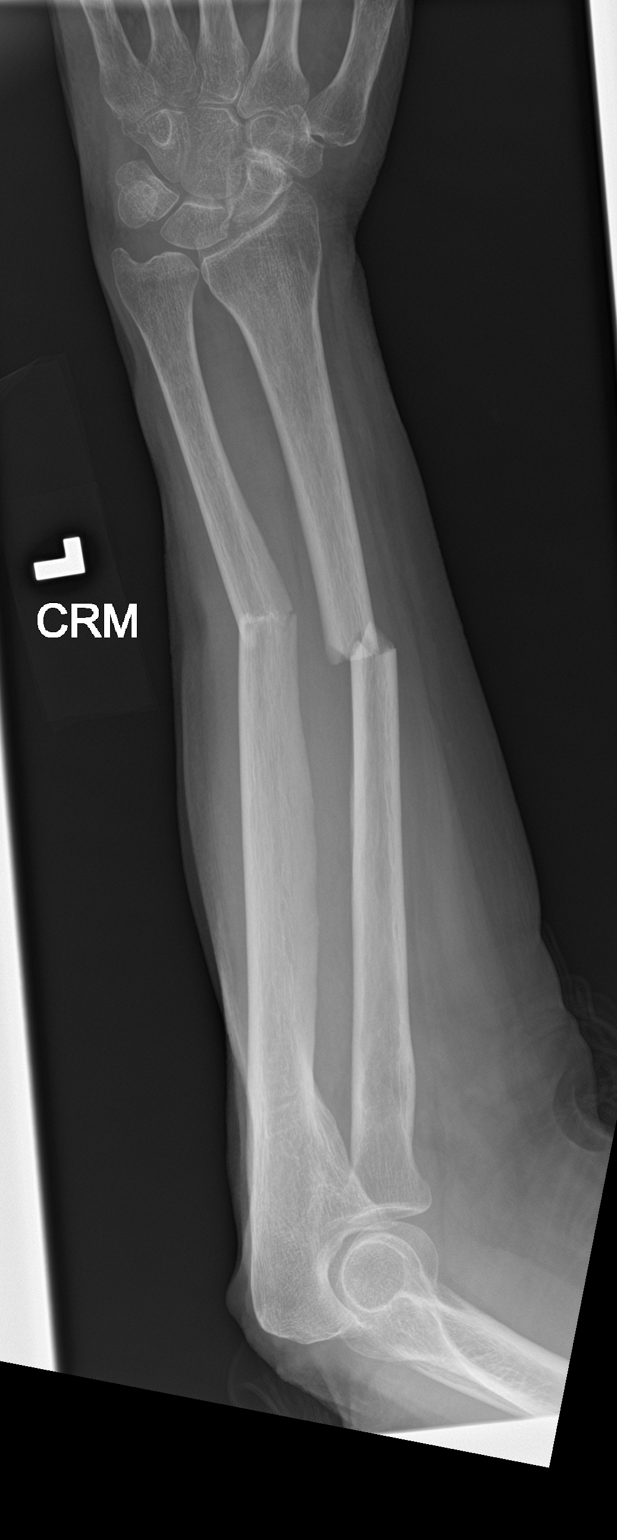

[forearm lat]
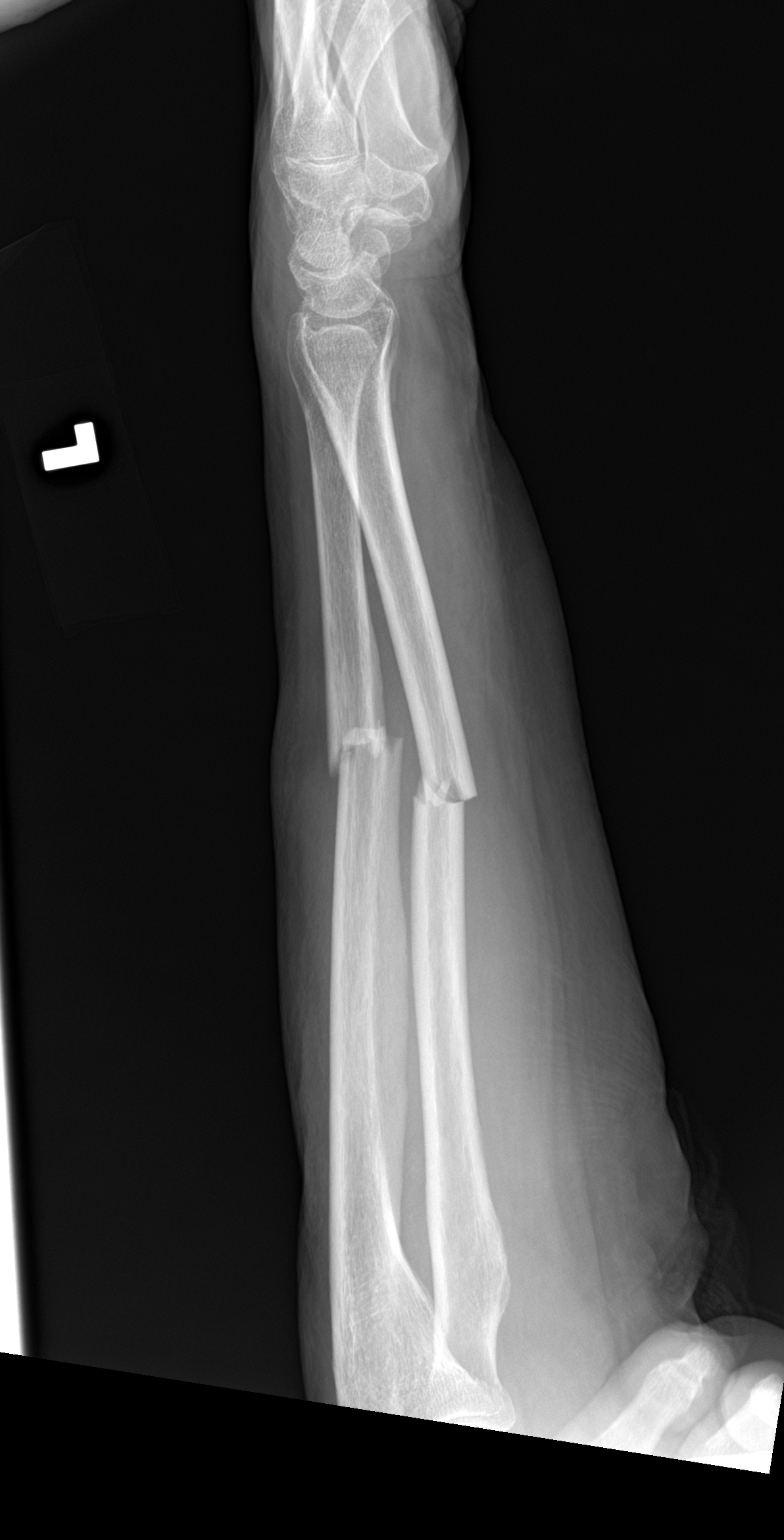

[2 of 2 positions shown; findings below may reference images not displayed]

FINDINGS: Transverse but comminuted both-bone forearm fracture. Midshaft
fractures of the left radius and ulna. [DATE] shaft with posterior
displacement of the ulna, with similar anterior displacement but
posterior angulation of the radius fracture.

Alignment appears preserved at the left elbow and wrist. No other
acute osseous abnormality identified.
IMPRESSION: Acute transverse and mildly comminuted midshaft fractures of the
left radius and ulna.

## 2020-07-02 ENCOUNTER — Other Ambulatory Visit: Payer: Self-pay | Admitting: Family Medicine

## 2020-07-02 DIAGNOSIS — Z1231 Encounter for screening mammogram for malignant neoplasm of breast: Secondary | ICD-10-CM

## 2020-07-02 DIAGNOSIS — E2839 Other primary ovarian failure: Secondary | ICD-10-CM

## 2020-07-06 ENCOUNTER — Other Ambulatory Visit: Payer: Self-pay | Admitting: Family Medicine

## 2020-07-06 DIAGNOSIS — Z13820 Encounter for screening for osteoporosis: Secondary | ICD-10-CM

## 2020-07-07 ENCOUNTER — Ambulatory Visit (HOSPITAL_COMMUNITY): Payer: Medicare Other | Attending: Cardiology

## 2020-07-07 ENCOUNTER — Telehealth: Payer: Self-pay | Admitting: Cardiovascular Disease

## 2020-07-07 ENCOUNTER — Other Ambulatory Visit: Payer: Self-pay

## 2020-07-07 DIAGNOSIS — I5042 Chronic combined systolic (congestive) and diastolic (congestive) heart failure: Secondary | ICD-10-CM | POA: Insufficient documentation

## 2020-07-07 DIAGNOSIS — I2510 Atherosclerotic heart disease of native coronary artery without angina pectoris: Secondary | ICD-10-CM | POA: Insufficient documentation

## 2020-07-07 DIAGNOSIS — I480 Paroxysmal atrial fibrillation: Secondary | ICD-10-CM | POA: Insufficient documentation

## 2020-07-07 LAB — ECHOCARDIOGRAM COMPLETE
AR max vel: 3.66 cm2
AV Area VTI: 4.17 cm2
AV Area mean vel: 4.08 cm2
AV Mean grad: 9 mmHg
AV Peak grad: 17.5 mmHg
Ao pk vel: 2.09 m/s
Area-P 1/2: 2.99 cm2
S' Lateral: 2.4 cm

## 2020-07-07 NOTE — Telephone Encounter (Signed)
Reviewed results with patient who verbalized understanding. 

## 2020-07-07 NOTE — Telephone Encounter (Signed)
-----   Message from Thayer Headings, MD sent at 07/07/2020  3:32 PM EST ----- Nomral LV systolic function.   Grade 1 diastolic dysfunction. Mild AS

## 2020-07-07 NOTE — Telephone Encounter (Signed)
Patient is returning call to discuss echo results. °

## 2020-07-13 ENCOUNTER — Encounter: Payer: Self-pay | Admitting: Cardiovascular Disease
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
## 2020-07-14 ENCOUNTER — Encounter: Payer: Self-pay | Admitting: Cardiovascular Disease

## 2020-07-14 ENCOUNTER — Ambulatory Visit (INDEPENDENT_AMBULATORY_CARE_PROVIDER_SITE_OTHER): Payer: Medicare Other | Admitting: Cardiovascular Disease

## 2020-07-14 ENCOUNTER — Other Ambulatory Visit: Payer: Self-pay

## 2020-07-14 VITALS — BP 124/72 | HR 78 | Ht 62.0 in | Wt 178.8 lb

## 2020-07-14 DIAGNOSIS — Z951 Presence of aortocoronary bypass graft: Secondary | ICD-10-CM | POA: Diagnosis not present

## 2020-07-14 DIAGNOSIS — Z79899 Other long term (current) drug therapy: Secondary | ICD-10-CM

## 2020-07-14 DIAGNOSIS — I5032 Chronic diastolic (congestive) heart failure: Secondary | ICD-10-CM

## 2020-07-14 DIAGNOSIS — I2510 Atherosclerotic heart disease of native coronary artery without angina pectoris: Secondary | ICD-10-CM | POA: Diagnosis not present

## 2020-07-14 DIAGNOSIS — I5042 Chronic combined systolic (congestive) and diastolic (congestive) heart failure: Secondary | ICD-10-CM

## 2020-07-14 DIAGNOSIS — I110 Hypertensive heart disease with heart failure: Secondary | ICD-10-CM

## 2020-07-14 NOTE — Progress Notes (Signed)
lidpid

## 2020-07-14 NOTE — Patient Instructions (Signed)
Medication Instructions:  Your physician recommends that you continue on your current medications as directed. Please refer to the Current Medication list given to you today.  *If you need a refill on your cardiac medications before your next appointment, please call your pharmacy*   Lab Work: Lipid, Liver, Bmet in 6 months Fasting If you have labs (blood work) drawn today and your tests are completely normal, you will receive your results only by: [name] MyChart Message (if you have MyChart) OR . A paper copy in the mail If you have any lab test that is abnormal or we need to change your treatment, we will call you to review the results.   Testing/Procedures: none   Follow-Up: At [facility], you and your health needs are our priority.  As part of our continuing mission to provide you with exceptional heart care, we have created designated Provider Care Teams.  These Care Teams include your primary Cardiologist (physician) and Advanced Practice Providers (APPs -  Physician Assistants and Nurse Practitioners) who all work together to provide you with the care you need, when you need it.  We recommend signing up for the patient portal called "MyChart".  Sign up information is provided on this After Visit Summary.  MyChart is used to connect with patients for Virtual Visits (Telemedicine).  Patients are able to view lab/test results, encounter notes, upcoming appointments, etc.  Non-urgent messages can be sent to your provider as well.   To learn more about what you can do with MyChart, go to [email or web address].    Your next appointment:   6 month(s)  The format for your next appointment:   In Person  Provider:   You will see one of the following Advanced Practice Providers on your designated Care Team:    Richardson Dopp, PA-C  Robbie Lis, [place]     Other Instructions

## 2020-08-11 ENCOUNTER — Other Ambulatory Visit: Payer: Self-pay | Admitting: Adult Health

## 2020-08-11 DIAGNOSIS — E118 Type 2 diabetes mellitus with unspecified complications: Secondary | ICD-10-CM

## 2020-08-14 ENCOUNTER — Other Ambulatory Visit: Payer: Self-pay | Admitting: Adult Health

## 2020-08-14 DIAGNOSIS — E118 Type 2 diabetes mellitus with unspecified complications: Secondary | ICD-10-CM

## 2020-08-18 ENCOUNTER — Telehealth: Payer: Self-pay | Admitting: Hematology

## 2020-08-18 NOTE — Telephone Encounter (Signed)
Rescheduled 12/27 to 1/17 per provider pal. Patient has been called and notified.

## 2020-08-24 ENCOUNTER — Inpatient Hospital Stay: Payer: Medicare Other | Admitting: Hematology

## 2020-08-24 ENCOUNTER — Inpatient Hospital Stay: Payer: Medicare Other

## 2020-08-31 DIAGNOSIS — J45909 Unspecified asthma, uncomplicated: Secondary | ICD-10-CM | POA: Diagnosis not present

## 2020-08-31 DIAGNOSIS — E11319 Type 2 diabetes mellitus with unspecified diabetic retinopathy without macular edema: Secondary | ICD-10-CM | POA: Diagnosis not present

## 2020-08-31 DIAGNOSIS — K5730 Diverticulosis of large intestine without perforation or abscess without bleeding: Secondary | ICD-10-CM | POA: Diagnosis not present

## 2020-08-31 DIAGNOSIS — E039 Hypothyroidism, unspecified: Secondary | ICD-10-CM | POA: Diagnosis not present

## 2020-08-31 DIAGNOSIS — H9190 Unspecified hearing loss, unspecified ear: Secondary | ICD-10-CM | POA: Diagnosis not present

## 2020-08-31 DIAGNOSIS — M064 Inflammatory polyarthropathy: Secondary | ICD-10-CM | POA: Diagnosis not present

## 2020-08-31 DIAGNOSIS — G8929 Other chronic pain: Secondary | ICD-10-CM | POA: Diagnosis not present

## 2020-08-31 DIAGNOSIS — I509 Heart failure, unspecified: Secondary | ICD-10-CM | POA: Diagnosis not present

## 2020-08-31 DIAGNOSIS — E1142 Type 2 diabetes mellitus with diabetic polyneuropathy: Secondary | ICD-10-CM | POA: Diagnosis not present

## 2020-08-31 DIAGNOSIS — H409 Unspecified glaucoma: Secondary | ICD-10-CM | POA: Diagnosis not present

## 2020-08-31 DIAGNOSIS — I110 Hypertensive heart disease with heart failure: Secondary | ICD-10-CM | POA: Diagnosis not present

## 2020-08-31 DIAGNOSIS — G4700 Insomnia, unspecified: Secondary | ICD-10-CM | POA: Diagnosis not present

## 2020-08-31 DIAGNOSIS — E1151 Type 2 diabetes mellitus with diabetic peripheral angiopathy without gangrene: Secondary | ICD-10-CM | POA: Diagnosis not present

## 2020-08-31 DIAGNOSIS — I480 Paroxysmal atrial fibrillation: Secondary | ICD-10-CM | POA: Diagnosis not present

## 2020-08-31 DIAGNOSIS — I252 Old myocardial infarction: Secondary | ICD-10-CM | POA: Diagnosis not present

## 2020-08-31 DIAGNOSIS — I872 Venous insufficiency (chronic) (peripheral): Secondary | ICD-10-CM | POA: Diagnosis not present

## 2020-08-31 DIAGNOSIS — G43909 Migraine, unspecified, not intractable, without status migrainosus: Secondary | ICD-10-CM | POA: Diagnosis not present

## 2020-08-31 DIAGNOSIS — M150 Primary generalized (osteo)arthritis: Secondary | ICD-10-CM | POA: Diagnosis not present

## 2020-08-31 DIAGNOSIS — G4733 Obstructive sleep apnea (adult) (pediatric): Secondary | ICD-10-CM | POA: Diagnosis not present

## 2020-08-31 DIAGNOSIS — D509 Iron deficiency anemia, unspecified: Secondary | ICD-10-CM | POA: Diagnosis not present

## 2020-08-31 DIAGNOSIS — K219 Gastro-esophageal reflux disease without esophagitis: Secondary | ICD-10-CM | POA: Diagnosis not present

## 2020-08-31 DIAGNOSIS — I2510 Atherosclerotic heart disease of native coronary artery without angina pectoris: Secondary | ICD-10-CM | POA: Diagnosis not present

## 2020-09-03 DIAGNOSIS — G4700 Insomnia, unspecified: Secondary | ICD-10-CM | POA: Diagnosis not present

## 2020-09-03 DIAGNOSIS — M150 Primary generalized (osteo)arthritis: Secondary | ICD-10-CM | POA: Diagnosis not present

## 2020-09-03 DIAGNOSIS — H409 Unspecified glaucoma: Secondary | ICD-10-CM | POA: Diagnosis not present

## 2020-09-03 DIAGNOSIS — K5730 Diverticulosis of large intestine without perforation or abscess without bleeding: Secondary | ICD-10-CM | POA: Diagnosis not present

## 2020-09-03 DIAGNOSIS — I480 Paroxysmal atrial fibrillation: Secondary | ICD-10-CM | POA: Diagnosis not present

## 2020-09-03 DIAGNOSIS — G4733 Obstructive sleep apnea (adult) (pediatric): Secondary | ICD-10-CM | POA: Diagnosis not present

## 2020-09-03 DIAGNOSIS — D509 Iron deficiency anemia, unspecified: Secondary | ICD-10-CM | POA: Diagnosis not present

## 2020-09-03 DIAGNOSIS — E11319 Type 2 diabetes mellitus with unspecified diabetic retinopathy without macular edema: Secondary | ICD-10-CM | POA: Diagnosis not present

## 2020-09-03 DIAGNOSIS — I509 Heart failure, unspecified: Secondary | ICD-10-CM | POA: Diagnosis not present

## 2020-09-03 DIAGNOSIS — E039 Hypothyroidism, unspecified: Secondary | ICD-10-CM | POA: Diagnosis not present

## 2020-09-03 DIAGNOSIS — K219 Gastro-esophageal reflux disease without esophagitis: Secondary | ICD-10-CM | POA: Diagnosis not present

## 2020-09-03 DIAGNOSIS — I110 Hypertensive heart disease with heart failure: Secondary | ICD-10-CM | POA: Diagnosis not present

## 2020-09-03 DIAGNOSIS — M064 Inflammatory polyarthropathy: Secondary | ICD-10-CM | POA: Diagnosis not present

## 2020-09-03 DIAGNOSIS — E1142 Type 2 diabetes mellitus with diabetic polyneuropathy: Secondary | ICD-10-CM | POA: Diagnosis not present

## 2020-09-03 DIAGNOSIS — G8929 Other chronic pain: Secondary | ICD-10-CM | POA: Diagnosis not present

## 2020-09-03 DIAGNOSIS — I252 Old myocardial infarction: Secondary | ICD-10-CM | POA: Diagnosis not present

## 2020-09-03 DIAGNOSIS — I2510 Atherosclerotic heart disease of native coronary artery without angina pectoris: Secondary | ICD-10-CM | POA: Diagnosis not present

## 2020-09-03 DIAGNOSIS — E1151 Type 2 diabetes mellitus with diabetic peripheral angiopathy without gangrene: Secondary | ICD-10-CM | POA: Diagnosis not present

## 2020-09-03 DIAGNOSIS — I872 Venous insufficiency (chronic) (peripheral): Secondary | ICD-10-CM | POA: Diagnosis not present

## 2020-09-03 DIAGNOSIS — H9190 Unspecified hearing loss, unspecified ear: Secondary | ICD-10-CM | POA: Diagnosis not present

## 2020-09-03 DIAGNOSIS — J45909 Unspecified asthma, uncomplicated: Secondary | ICD-10-CM | POA: Diagnosis not present

## 2020-09-03 DIAGNOSIS — G43909 Migraine, unspecified, not intractable, without status migrainosus: Secondary | ICD-10-CM | POA: Diagnosis not present

## 2020-09-08 DIAGNOSIS — I872 Venous insufficiency (chronic) (peripheral): Secondary | ICD-10-CM | POA: Diagnosis not present

## 2020-09-08 DIAGNOSIS — D509 Iron deficiency anemia, unspecified: Secondary | ICD-10-CM | POA: Diagnosis not present

## 2020-09-08 DIAGNOSIS — I110 Hypertensive heart disease with heart failure: Secondary | ICD-10-CM | POA: Diagnosis not present

## 2020-09-08 DIAGNOSIS — K5730 Diverticulosis of large intestine without perforation or abscess without bleeding: Secondary | ICD-10-CM | POA: Diagnosis not present

## 2020-09-08 DIAGNOSIS — G4700 Insomnia, unspecified: Secondary | ICD-10-CM | POA: Diagnosis not present

## 2020-09-08 DIAGNOSIS — E11319 Type 2 diabetes mellitus with unspecified diabetic retinopathy without macular edema: Secondary | ICD-10-CM | POA: Diagnosis not present

## 2020-09-08 DIAGNOSIS — M064 Inflammatory polyarthropathy: Secondary | ICD-10-CM | POA: Diagnosis not present

## 2020-09-08 DIAGNOSIS — G43909 Migraine, unspecified, not intractable, without status migrainosus: Secondary | ICD-10-CM | POA: Diagnosis not present

## 2020-09-08 DIAGNOSIS — G4733 Obstructive sleep apnea (adult) (pediatric): Secondary | ICD-10-CM | POA: Diagnosis not present

## 2020-09-08 DIAGNOSIS — I2510 Atherosclerotic heart disease of native coronary artery without angina pectoris: Secondary | ICD-10-CM | POA: Diagnosis not present

## 2020-09-08 DIAGNOSIS — G8929 Other chronic pain: Secondary | ICD-10-CM | POA: Diagnosis not present

## 2020-09-08 DIAGNOSIS — I480 Paroxysmal atrial fibrillation: Secondary | ICD-10-CM | POA: Diagnosis not present

## 2020-09-08 DIAGNOSIS — E1151 Type 2 diabetes mellitus with diabetic peripheral angiopathy without gangrene: Secondary | ICD-10-CM | POA: Diagnosis not present

## 2020-09-08 DIAGNOSIS — E1142 Type 2 diabetes mellitus with diabetic polyneuropathy: Secondary | ICD-10-CM | POA: Diagnosis not present

## 2020-09-08 DIAGNOSIS — M150 Primary generalized (osteo)arthritis: Secondary | ICD-10-CM | POA: Diagnosis not present

## 2020-09-08 DIAGNOSIS — J45909 Unspecified asthma, uncomplicated: Secondary | ICD-10-CM | POA: Diagnosis not present

## 2020-09-08 DIAGNOSIS — I252 Old myocardial infarction: Secondary | ICD-10-CM | POA: Diagnosis not present

## 2020-09-08 DIAGNOSIS — E039 Hypothyroidism, unspecified: Secondary | ICD-10-CM | POA: Diagnosis not present

## 2020-09-08 DIAGNOSIS — H409 Unspecified glaucoma: Secondary | ICD-10-CM | POA: Diagnosis not present

## 2020-09-08 DIAGNOSIS — H9190 Unspecified hearing loss, unspecified ear: Secondary | ICD-10-CM | POA: Diagnosis not present

## 2020-09-08 DIAGNOSIS — I509 Heart failure, unspecified: Secondary | ICD-10-CM | POA: Diagnosis not present

## 2020-09-08 DIAGNOSIS — K219 Gastro-esophageal reflux disease without esophagitis: Secondary | ICD-10-CM | POA: Diagnosis not present

## 2020-09-10 DIAGNOSIS — I252 Old myocardial infarction: Secondary | ICD-10-CM | POA: Diagnosis not present

## 2020-09-10 DIAGNOSIS — G8929 Other chronic pain: Secondary | ICD-10-CM | POA: Diagnosis not present

## 2020-09-10 DIAGNOSIS — I480 Paroxysmal atrial fibrillation: Secondary | ICD-10-CM | POA: Diagnosis not present

## 2020-09-10 DIAGNOSIS — H409 Unspecified glaucoma: Secondary | ICD-10-CM | POA: Diagnosis not present

## 2020-09-10 DIAGNOSIS — I110 Hypertensive heart disease with heart failure: Secondary | ICD-10-CM | POA: Diagnosis not present

## 2020-09-10 DIAGNOSIS — I2510 Atherosclerotic heart disease of native coronary artery without angina pectoris: Secondary | ICD-10-CM | POA: Diagnosis not present

## 2020-09-10 DIAGNOSIS — K219 Gastro-esophageal reflux disease without esophagitis: Secondary | ICD-10-CM | POA: Diagnosis not present

## 2020-09-10 DIAGNOSIS — E11319 Type 2 diabetes mellitus with unspecified diabetic retinopathy without macular edema: Secondary | ICD-10-CM | POA: Diagnosis not present

## 2020-09-10 DIAGNOSIS — I509 Heart failure, unspecified: Secondary | ICD-10-CM | POA: Diagnosis not present

## 2020-09-10 DIAGNOSIS — H9190 Unspecified hearing loss, unspecified ear: Secondary | ICD-10-CM | POA: Diagnosis not present

## 2020-09-10 DIAGNOSIS — M150 Primary generalized (osteo)arthritis: Secondary | ICD-10-CM | POA: Diagnosis not present

## 2020-09-10 DIAGNOSIS — G4733 Obstructive sleep apnea (adult) (pediatric): Secondary | ICD-10-CM | POA: Diagnosis not present

## 2020-09-10 DIAGNOSIS — E039 Hypothyroidism, unspecified: Secondary | ICD-10-CM | POA: Diagnosis not present

## 2020-09-10 DIAGNOSIS — E1142 Type 2 diabetes mellitus with diabetic polyneuropathy: Secondary | ICD-10-CM | POA: Diagnosis not present

## 2020-09-10 DIAGNOSIS — I872 Venous insufficiency (chronic) (peripheral): Secondary | ICD-10-CM | POA: Diagnosis not present

## 2020-09-10 DIAGNOSIS — D509 Iron deficiency anemia, unspecified: Secondary | ICD-10-CM | POA: Diagnosis not present

## 2020-09-10 DIAGNOSIS — K5730 Diverticulosis of large intestine without perforation or abscess without bleeding: Secondary | ICD-10-CM | POA: Diagnosis not present

## 2020-09-10 DIAGNOSIS — G43909 Migraine, unspecified, not intractable, without status migrainosus: Secondary | ICD-10-CM | POA: Diagnosis not present

## 2020-09-10 DIAGNOSIS — G4700 Insomnia, unspecified: Secondary | ICD-10-CM | POA: Diagnosis not present

## 2020-09-10 DIAGNOSIS — E1151 Type 2 diabetes mellitus with diabetic peripheral angiopathy without gangrene: Secondary | ICD-10-CM | POA: Diagnosis not present

## 2020-09-10 DIAGNOSIS — M064 Inflammatory polyarthropathy: Secondary | ICD-10-CM | POA: Diagnosis not present

## 2020-09-10 DIAGNOSIS — J45909 Unspecified asthma, uncomplicated: Secondary | ICD-10-CM | POA: Diagnosis not present

## 2020-09-11 ENCOUNTER — Telehealth: Payer: Self-pay | Admitting: Hematology

## 2020-09-11 NOTE — Telephone Encounter (Signed)
Rescheduled 01/17 appointment to 01/28 due to weather conditions and provider orders, patient has been called and notified.

## 2020-09-14 ENCOUNTER — Inpatient Hospital Stay: Payer: Medicare Other | Admitting: Hematology

## 2020-09-14 ENCOUNTER — Inpatient Hospital Stay: Payer: Medicare Other

## 2020-09-15 DIAGNOSIS — E039 Hypothyroidism, unspecified: Secondary | ICD-10-CM | POA: Diagnosis not present

## 2020-09-15 DIAGNOSIS — I2510 Atherosclerotic heart disease of native coronary artery without angina pectoris: Secondary | ICD-10-CM | POA: Diagnosis not present

## 2020-09-15 DIAGNOSIS — E11319 Type 2 diabetes mellitus with unspecified diabetic retinopathy without macular edema: Secondary | ICD-10-CM | POA: Diagnosis not present

## 2020-09-15 DIAGNOSIS — G4700 Insomnia, unspecified: Secondary | ICD-10-CM | POA: Diagnosis not present

## 2020-09-15 DIAGNOSIS — I509 Heart failure, unspecified: Secondary | ICD-10-CM | POA: Diagnosis not present

## 2020-09-15 DIAGNOSIS — M150 Primary generalized (osteo)arthritis: Secondary | ICD-10-CM | POA: Diagnosis not present

## 2020-09-15 DIAGNOSIS — I110 Hypertensive heart disease with heart failure: Secondary | ICD-10-CM | POA: Diagnosis not present

## 2020-09-15 DIAGNOSIS — J45909 Unspecified asthma, uncomplicated: Secondary | ICD-10-CM | POA: Diagnosis not present

## 2020-09-15 DIAGNOSIS — M064 Inflammatory polyarthropathy: Secondary | ICD-10-CM | POA: Diagnosis not present

## 2020-09-15 DIAGNOSIS — E1151 Type 2 diabetes mellitus with diabetic peripheral angiopathy without gangrene: Secondary | ICD-10-CM | POA: Diagnosis not present

## 2020-09-15 DIAGNOSIS — D509 Iron deficiency anemia, unspecified: Secondary | ICD-10-CM | POA: Diagnosis not present

## 2020-09-15 DIAGNOSIS — I480 Paroxysmal atrial fibrillation: Secondary | ICD-10-CM | POA: Diagnosis not present

## 2020-09-15 DIAGNOSIS — G4733 Obstructive sleep apnea (adult) (pediatric): Secondary | ICD-10-CM | POA: Diagnosis not present

## 2020-09-15 DIAGNOSIS — K5730 Diverticulosis of large intestine without perforation or abscess without bleeding: Secondary | ICD-10-CM | POA: Diagnosis not present

## 2020-09-15 DIAGNOSIS — G43909 Migraine, unspecified, not intractable, without status migrainosus: Secondary | ICD-10-CM | POA: Diagnosis not present

## 2020-09-15 DIAGNOSIS — H409 Unspecified glaucoma: Secondary | ICD-10-CM | POA: Diagnosis not present

## 2020-09-15 DIAGNOSIS — I252 Old myocardial infarction: Secondary | ICD-10-CM | POA: Diagnosis not present

## 2020-09-15 DIAGNOSIS — K219 Gastro-esophageal reflux disease without esophagitis: Secondary | ICD-10-CM | POA: Diagnosis not present

## 2020-09-15 DIAGNOSIS — E1142 Type 2 diabetes mellitus with diabetic polyneuropathy: Secondary | ICD-10-CM | POA: Diagnosis not present

## 2020-09-15 DIAGNOSIS — H9190 Unspecified hearing loss, unspecified ear: Secondary | ICD-10-CM | POA: Diagnosis not present

## 2020-09-15 DIAGNOSIS — I872 Venous insufficiency (chronic) (peripheral): Secondary | ICD-10-CM | POA: Diagnosis not present

## 2020-09-15 DIAGNOSIS — G8929 Other chronic pain: Secondary | ICD-10-CM | POA: Diagnosis not present

## 2020-09-17 DIAGNOSIS — E1151 Type 2 diabetes mellitus with diabetic peripheral angiopathy without gangrene: Secondary | ICD-10-CM | POA: Diagnosis not present

## 2020-09-17 DIAGNOSIS — I2510 Atherosclerotic heart disease of native coronary artery without angina pectoris: Secondary | ICD-10-CM | POA: Diagnosis not present

## 2020-09-17 DIAGNOSIS — I509 Heart failure, unspecified: Secondary | ICD-10-CM | POA: Diagnosis not present

## 2020-09-17 DIAGNOSIS — E1142 Type 2 diabetes mellitus with diabetic polyneuropathy: Secondary | ICD-10-CM | POA: Diagnosis not present

## 2020-09-17 DIAGNOSIS — G8929 Other chronic pain: Secondary | ICD-10-CM | POA: Diagnosis not present

## 2020-09-17 DIAGNOSIS — G4733 Obstructive sleep apnea (adult) (pediatric): Secondary | ICD-10-CM | POA: Diagnosis not present

## 2020-09-17 DIAGNOSIS — I872 Venous insufficiency (chronic) (peripheral): Secondary | ICD-10-CM | POA: Diagnosis not present

## 2020-09-17 DIAGNOSIS — K219 Gastro-esophageal reflux disease without esophagitis: Secondary | ICD-10-CM | POA: Diagnosis not present

## 2020-09-17 DIAGNOSIS — M150 Primary generalized (osteo)arthritis: Secondary | ICD-10-CM | POA: Diagnosis not present

## 2020-09-17 DIAGNOSIS — I110 Hypertensive heart disease with heart failure: Secondary | ICD-10-CM | POA: Diagnosis not present

## 2020-09-17 DIAGNOSIS — I480 Paroxysmal atrial fibrillation: Secondary | ICD-10-CM | POA: Diagnosis not present

## 2020-09-17 DIAGNOSIS — K5730 Diverticulosis of large intestine without perforation or abscess without bleeding: Secondary | ICD-10-CM | POA: Diagnosis not present

## 2020-09-17 DIAGNOSIS — D509 Iron deficiency anemia, unspecified: Secondary | ICD-10-CM | POA: Diagnosis not present

## 2020-09-17 DIAGNOSIS — H409 Unspecified glaucoma: Secondary | ICD-10-CM | POA: Diagnosis not present

## 2020-09-17 DIAGNOSIS — I252 Old myocardial infarction: Secondary | ICD-10-CM | POA: Diagnosis not present

## 2020-09-17 DIAGNOSIS — E11319 Type 2 diabetes mellitus with unspecified diabetic retinopathy without macular edema: Secondary | ICD-10-CM | POA: Diagnosis not present

## 2020-09-17 DIAGNOSIS — E039 Hypothyroidism, unspecified: Secondary | ICD-10-CM | POA: Diagnosis not present

## 2020-09-17 DIAGNOSIS — J45909 Unspecified asthma, uncomplicated: Secondary | ICD-10-CM | POA: Diagnosis not present

## 2020-09-17 DIAGNOSIS — G43909 Migraine, unspecified, not intractable, without status migrainosus: Secondary | ICD-10-CM | POA: Diagnosis not present

## 2020-09-17 DIAGNOSIS — M064 Inflammatory polyarthropathy: Secondary | ICD-10-CM | POA: Diagnosis not present

## 2020-09-17 DIAGNOSIS — H9190 Unspecified hearing loss, unspecified ear: Secondary | ICD-10-CM | POA: Diagnosis not present

## 2020-09-17 DIAGNOSIS — G4700 Insomnia, unspecified: Secondary | ICD-10-CM | POA: Diagnosis not present

## 2020-09-22 DIAGNOSIS — I509 Heart failure, unspecified: Secondary | ICD-10-CM | POA: Diagnosis not present

## 2020-09-22 DIAGNOSIS — I480 Paroxysmal atrial fibrillation: Secondary | ICD-10-CM | POA: Diagnosis not present

## 2020-09-22 DIAGNOSIS — E1142 Type 2 diabetes mellitus with diabetic polyneuropathy: Secondary | ICD-10-CM | POA: Diagnosis not present

## 2020-09-22 DIAGNOSIS — I2510 Atherosclerotic heart disease of native coronary artery without angina pectoris: Secondary | ICD-10-CM | POA: Diagnosis not present

## 2020-09-22 DIAGNOSIS — E782 Mixed hyperlipidemia: Secondary | ICD-10-CM | POA: Diagnosis not present

## 2020-09-22 DIAGNOSIS — R197 Diarrhea, unspecified: Secondary | ICD-10-CM | POA: Diagnosis not present

## 2020-09-22 DIAGNOSIS — G4700 Insomnia, unspecified: Secondary | ICD-10-CM | POA: Diagnosis not present

## 2020-09-22 DIAGNOSIS — E039 Hypothyroidism, unspecified: Secondary | ICD-10-CM | POA: Diagnosis not present

## 2020-09-24 DIAGNOSIS — I252 Old myocardial infarction: Secondary | ICD-10-CM | POA: Diagnosis not present

## 2020-09-24 DIAGNOSIS — J45909 Unspecified asthma, uncomplicated: Secondary | ICD-10-CM | POA: Diagnosis not present

## 2020-09-24 DIAGNOSIS — I872 Venous insufficiency (chronic) (peripheral): Secondary | ICD-10-CM | POA: Diagnosis not present

## 2020-09-24 DIAGNOSIS — E11319 Type 2 diabetes mellitus with unspecified diabetic retinopathy without macular edema: Secondary | ICD-10-CM | POA: Diagnosis not present

## 2020-09-24 DIAGNOSIS — E1142 Type 2 diabetes mellitus with diabetic polyneuropathy: Secondary | ICD-10-CM | POA: Diagnosis not present

## 2020-09-24 DIAGNOSIS — I480 Paroxysmal atrial fibrillation: Secondary | ICD-10-CM | POA: Diagnosis not present

## 2020-09-24 DIAGNOSIS — M150 Primary generalized (osteo)arthritis: Secondary | ICD-10-CM | POA: Diagnosis not present

## 2020-09-24 DIAGNOSIS — G4700 Insomnia, unspecified: Secondary | ICD-10-CM | POA: Diagnosis not present

## 2020-09-24 DIAGNOSIS — H409 Unspecified glaucoma: Secondary | ICD-10-CM | POA: Diagnosis not present

## 2020-09-24 DIAGNOSIS — K219 Gastro-esophageal reflux disease without esophagitis: Secondary | ICD-10-CM | POA: Diagnosis not present

## 2020-09-24 DIAGNOSIS — I110 Hypertensive heart disease with heart failure: Secondary | ICD-10-CM | POA: Diagnosis not present

## 2020-09-24 DIAGNOSIS — H9190 Unspecified hearing loss, unspecified ear: Secondary | ICD-10-CM | POA: Diagnosis not present

## 2020-09-24 DIAGNOSIS — I2510 Atherosclerotic heart disease of native coronary artery without angina pectoris: Secondary | ICD-10-CM | POA: Diagnosis not present

## 2020-09-24 DIAGNOSIS — G4733 Obstructive sleep apnea (adult) (pediatric): Secondary | ICD-10-CM | POA: Diagnosis not present

## 2020-09-24 DIAGNOSIS — K5730 Diverticulosis of large intestine without perforation or abscess without bleeding: Secondary | ICD-10-CM | POA: Diagnosis not present

## 2020-09-24 DIAGNOSIS — D509 Iron deficiency anemia, unspecified: Secondary | ICD-10-CM | POA: Diagnosis not present

## 2020-09-24 DIAGNOSIS — E039 Hypothyroidism, unspecified: Secondary | ICD-10-CM | POA: Diagnosis not present

## 2020-09-24 DIAGNOSIS — M064 Inflammatory polyarthropathy: Secondary | ICD-10-CM | POA: Diagnosis not present

## 2020-09-24 DIAGNOSIS — I509 Heart failure, unspecified: Secondary | ICD-10-CM | POA: Diagnosis not present

## 2020-09-24 DIAGNOSIS — G8929 Other chronic pain: Secondary | ICD-10-CM | POA: Diagnosis not present

## 2020-09-24 DIAGNOSIS — G43909 Migraine, unspecified, not intractable, without status migrainosus: Secondary | ICD-10-CM | POA: Diagnosis not present

## 2020-09-24 DIAGNOSIS — E1151 Type 2 diabetes mellitus with diabetic peripheral angiopathy without gangrene: Secondary | ICD-10-CM | POA: Diagnosis not present

## 2020-09-24 NOTE — Progress Notes (Signed)
[name]    HEMATOLOGY/ONCOLOGY CLINIC NOTE  Date of Service: 09/24/2020  Patient Care Team: [name], MD as PCP - General (Family Medicine) Nahser, Wonda Cheng, MD as PCP - Cardiology (Cardiology)  CHIEF COMPLAINTS/PURPOSE OF CONSULTATION:  Normocytic Anemia  HISTORY OF PRESENTING ILLNESS:  Megan [name] is a wonderful 75 y.o. female who has been referred to [name] by Dr .[name], MD  for evaluation and management of normocytic anemia.  Patient has a h/o inflammatory arthritis, hypothyroidism, iron deficiency, HTN, DM2, glaucoma, asthma . Patient had recent labs with her PCP on 08/13/2018 which showed WBC 7.9k, HCT 30.4, MCV 86, platelets of 349k. Nl FT4 levels. Ferritin 32.  Interval History:   Megan [name] returns today for management and evaluation of her anemia. The patient's last visit with [name] was on 02/24/2020. The pt reports that she is doing well overall.  The pt reports no new concerns or symptoms outside leg swelling and increased fatigue. She notes new medication changes as she is no longer taking Miralax, Gabapentin, Voltaren, and the Lidocaine patch. The pt notes that she is still having a caretaker daily and PT twice weekly.  The pt has had one gout attack since September 2021. She took Prednisone for this and it was improved within the seven days.   Lab results today 09/25/2020 of CBC w/diff and CMP is as follows: all values are WNL except for Glucose of 140, BUN of 34, Alkaline Phosphatase of 137, Total Bilirubin of <0.2, GFR est of 59. 09/25/2020 Iron and TIBC with Iron at 63, Sat Ratio of 20, and UIBC of 253. 09/25/2020 Vitamin B12 in progress. 09/25/2020 Ferritin is at 38.  On review of systems, pt reports loose stools, fatigue and denies open sores, recent cellulitis, fevers, chills, night sweats, back pain, abdominal pain, leg swelling and any other symptoms.   MEDICAL HISTORY:  Past Medical History:  Diagnosis Date  . Allergy    bee stings   . Anxiety   . Asthmatic bronchitis   . Broken arm 1957/2008   right  . Cataract    had surgery   . Depression   . Diabetes mellitus without complication ([place])    prediabetes diet controlled  . Diverticulosis   . GERD (gastroesophageal reflux disease)   . Glaucoma    BIL  . Hemorrhoids   . Hx of adenomatous colonic polyps 12/02/2010  . Hyperlipidemia   . Hypertension   . Hypothyroidism 05/2018  . Inflammatory arthritis   . Knee bursitis   . Migraines   . Obesity   . Osteoarthritis   . Sleep apnea     SURGICAL HISTORY: Past Surgical History:  Procedure Laterality Date  . CARPAL TUNNEL RELEASE Left    left wrist  . CATARACT EXTRACTION W/ INTRAOCULAR LENS  IMPLANT, BILATERAL Bilateral    1999 left, 2008 right  . COLONOSCOPY    . CORONARY ARTERY BYPASS GRAFT N/A 05/15/2019   Procedure: CORONARY ARTERY BYPASS GRAFTING (CABG) times two, left internal mammary artery to left anterior descending artery and left greater saphenous vein to posterior descending artery, left sapheouns vein harvested endoscopically ;  Surgeon: Grace Isaac, MD;  Location: [facility];  Service: Open Heart Surgery;  Laterality: N/A;  . EXCISION MORTON'S NEUROMA Left 1978   left foot  . GLAUCOMA SURGERY Bilateral    and laser surgery, 2004 left, 2008 right  . LEFT HEART CATH AND CORONARY ANGIOGRAPHY N/A 05/13/2019   Procedure: LEFT HEART CATH AND CORONARY ANGIOGRAPHY;  Surgeon: Martinique, Peter M, MD;  Location: [facility];  Service: Cardiovascular;  Laterality: N/A;  . ORIF RADIAL FRACTURE Left 12/13/2019   Procedure: OPEN REDUCTION INTERNAL FIXATION (ORIF) FOREARM FRACTURE;  Surgeon: Iran Planas, MD;  Location: [facility];  Service: Orthopedics;  Laterality: Left;  . PARTIAL HYSTERECTOMY  1991   Left an ovary  . POLYPECTOMY    . TEE WITHOUT CARDIOVERSION N/A 05/15/2019   Procedure: TRANSESOPHAGEAL ECHOCARDIOGRAM (TEE);  Surgeon: Grace Isaac, MD;  Location: [facility];  Service: Open Heart Surgery;   Laterality: N/A;  . TOENAIL AVULSION Right    big toe    SOCIAL HISTORY: Social History   Socioeconomic History  . Marital status: Married    Spouse name: Lynnae Sandhoff  . Number of children: 0  . Years of education: Not on file  . Highest education level: Associate degree: occupational, Hotel manager, or vocational program  Occupational History  . Occupation: retired  Tobacco Use  . Smoking status: Former Smoker    Types: Cigarettes    Quit date: 08/30/1971    Years since quitting: 49.1  . Smokeless tobacco: Never Used  Vaping Use  . Vaping Use: Never used  Substance and Sexual Activity  . Alcohol use: No    Alcohol/week: 0.0 standard drinks  . Drug use: No  . Sexual activity: Not on file  Other Topics Concern  . Not on file  Social History Narrative   She is widowed, no children, retired.  Lives in a one story home.  Retired from Geologist, engineering for [employer].   Social Determinants of Health   Financial Resource Strain: Not on file  Food Insecurity: Not on file  Transportation Needs: Not on file  Physical Activity: Not on file  Stress: Not on file  Social Connections: Not on file  Intimate Partner Violence: Not on file    FAMILY HISTORY: Family History  Problem Relation Age of Onset  . Colon polyps Maternal Grandfather   . Bone cancer Maternal Grandfather   . Hypertension Mother   . Diabetes Mother   . Parkinson's disease Mother   . Hypertension Father   . Diabetes Father   . Heart Problems Father   . Asthma Brother   . Diabetes Brother   . Breast cancer Brother        stage 4, both breast  . Obesity Other   . Diabetes Sister        x 2  . COPD Sister   . Asthma Sister   . Clotting disorder Sister        blood count  . Rectal cancer Neg Hx   . Stomach cancer Neg Hx   . Esophageal cancer Neg Hx     ALLERGIES:  is allergic to aspirin, bee pollen, codeine, fish oil, sulfa antibiotics, hornet venom, iron, pantoprazole, tape, camphor, lisinopril, penicillins,  sulfasalazine, and vicodin [hydrocodone-acetaminophen].  MEDICATIONS:  Current Outpatient Medications  Medication Sig Dispense Refill  . acetaminophen (TYLENOL) 650 MG CR tablet Take 650 mg by mouth every 8 (eight) hours.     [name] allopurinol (ZYLOPRIM) 100 MG tablet Take 1 tablet (100 mg total) by mouth daily. 30 tablet 0  . atorvastatin (LIPITOR) 80 MG tablet Take 1 tablet (80 mg total) by mouth daily at 6 PM. 30 tablet 2  . calcium carbonate (TUMS - DOSED IN MG ELEMENTAL CALCIUM) 500 MG chewable tablet Chew 1-2 tablets by mouth as needed for indigestion or heartburn.    . clopidogrel (PLAVIX) 75  MG tablet Take 1 tablet (75 mg total) by mouth daily. 30 tablet 2  . CVS B6 100 MG tablet Take 1 tablet by mouth at bedtime.    . diclofenac sodium (VOLTAREN) 1 % GEL Apply 2 gm topically to left knee 100 g 0  . EPINEPHrine 0.3 mg/0.3 mL IJ SOAJ injection Inject 0.3 mg into the muscle once as needed for anaphylaxis.     . famotidine (PEPCID) 20 MG tablet Take 20 mg by mouth 2 (two) times daily.    . furosemide (LASIX) 40 MG tablet Take 1 tablet (40 mg total) by mouth daily. 90 tablet 3  . gabapentin (NEURONTIN) 100 MG capsule Take 100 mg by mouth every 8 (eight) hours as needed (for pain).    [name] levothyroxine (SYNTHROID) 75 MCG tablet Take 75 mcg by mouth daily before breakfast.    . Lidocaine (ASPERCREME LIDOCAINE) 4 % PTCH Place 1 patch onto the skin daily. APPLY TOPICALLY TO LOWER BACK ONCE DAILY FOR PAIN 30 patch 0  . LORazepam (ATIVAN) 0.5 MG tablet Take 0.5 mg by mouth every 8 (eight) hours.    [name] losartan (COZAAR) 25 MG tablet Take 0.5 tablets (12.5 mg total) by mouth daily. 45 tablet 3  . Magnesium Oxide 400 MG CAPS Take 1 capsule (400 mg total) by mouth daily. 90 capsule 3  . meclizine (ANTIVERT) 25 MG tablet Take 25 mg by mouth 3 (three) times daily as needed for dizziness.     . Melatonin 3 MG TABS Take 1 tablet (3 mg total) by mouth at bedtime. FOR INSOMNIA 30 tablet 0  . metFORMIN  (GLUCOPHAGE) 500 MG tablet Take 1 tablet (500 mg total) by mouth 2 (two) times daily with a meal. 60 tablet 0  . metoprolol succinate (TOPROL XL) 25 MG 24 hr tablet Take 1 tablet (25 mg total) by mouth daily. 90 tablet 3  . Multiple Vitamins-Iron (MULTIVITAMINS WITH IRON) TABS tablet Take 1 tablet by mouth daily. 100 tablet 1  . ondansetron (ZOFRAN) 4 MG tablet Take 1 tablet (4 mg total) by mouth every 6 (six) hours as needed for nausea or vomiting. GIVE 1 TABLET BY MOUTH EVERY 6 HOURS AS NEEDED FOR NAUSEA/VOMITING 20 tablet 0  . ONETOUCH VERIO test strip USE TO TEST BLOOD GLUCOSE ONCE DAILY 100 each 0  . pantoprazole (PROTONIX) 20 MG tablet Take 20 mg by mouth daily.    . polyethylene glycol (MIRALAX / GLYCOLAX) 17 g packet Take 17 g by mouth daily. 14 each 0  . polyvinyl alcohol (LIQUIFILM TEARS) 1.4 % ophthalmic solution Place 1 drop into both eyes every 4 (four) hours as needed for dry eyes. 15 mL 0  . potassium chloride SA (KLOR-CON M20) 20 MEQ tablet Take 1 tablet by mouth once a week. On Wednesday    . spironolactone (ALDACTONE) 25 MG tablet Take 12.5 mg by mouth at bedtime.     [name] tiZANidine (ZANAFLEX) 2 MG tablet Take 1-2 tablets by mouth 3 (three) times daily as needed. Muscle relaxant    . traMADol (ULTRAM) 50 MG tablet Take 50 mg by mouth 4 (four) times daily as needed.    . vitamin B-12 (CYANOCOBALAMIN) 1000 MCG tablet 1 tab by orally daily 30 tablet 0  . Vitamin D, Ergocalciferol, (DRISDOL) 1.25 MG (50000 UNIT) CAPS capsule Take 50,000 Units by mouth every Thursday.     No current facility-administered medications for this visit.    REVIEW OF SYSTEMS:   10 Point review of Systems was  done is negative except as noted above.   PHYSICAL EXAMINATION: ECOG PERFORMANCE STATUS: 2 - Symptomatic, <50% confined to bed  There were no vitals filed for this visit. There were no vitals filed for this visit. .There is no height or weight on file to calculate BMI.  Exam was given in a chair.    GENERAL:alert, in no acute distress and comfortable SKIN: no acute rashes, no significant lesions EYES: conjunctiva are pink and non-injected, sclera anicteric OROPHARYNX: MMM, no exudates, no oropharyngeal erythema or ulceration NECK: supple, no JVD LYMPH:  no palpable lymphadenopathy in the cervical, axillary or inguinal regions LUNGS: clear to auscultation b/l with normal respiratory effort HEART: regular rate & rhythm ABDOMEN:  normoactive bowel sounds , non tender, not distended. Extremity: no pedal edema PSYCH: alert & oriented x 3 with fluent speech NEURO: no focal motor/sensory deficits  LABORATORY DATA:  I have reviewed the data as listed  . CBC Latest Ref Rng & Units 02/24/2020 12/16/2019 12/12/2019  WBC 4.0 - 10.5 K/uL 8.0 8.3 6.8  Hemoglobin 12.0 - 15.0 g/dL 12.1 10.3(L) 10.5(L)  Hematocrit 36.0 - 46.0 % 36.7 31.8(L) 33.5(L)  Platelets 150 - 400 K/uL 236 259 244   . CMP Latest Ref Rng & Units 02/24/2020 01/07/2020 12/16/2019  Glucose 70 - 99 mg/dL 151(H) - 152(H)  BUN 8 - 23 mg/dL 18 - 44(H)  Creatinine 0.44 - 1.00 mg/dL 0.84 - 1.13(H)  Sodium 135 - 145 mmol/L 142 - 137  Potassium 3.5 - 5.1 mmol/L 4.2 - 3.9  Chloride 98 - 111 mmol/L 108 - 99  CO2 22 - 32 mmol/L 23 - 28  Calcium 8.9 - 10.3 mg/dL 9.0 - 8.9  Total Protein 6.5 - 8.1 g/dL 6.8 6.5 -  Total Bilirubin 0.3 - 1.2 mg/dL <0.2(L) <0.2 -  Alkaline Phos 38 - 126 U/L 105 125(H) -  AST 15 - 41 U/L 16 25 -  ALT 0 - 44 U/L 21 34(H) -   . Lab Results  Component Value Date   IRON 64 02/24/2020   TIBC 256 02/24/2020   IRONPCTSAT 25 02/24/2020   (Iron and TIBC)  Lab Results  Component Value Date   FERRITIN 75 02/24/2020    RADIOGRAPHIC STUDIES: I have personally reviewed the radiological images as listed and agreed with the findings in the report. No results found.  ASSESSMENT & PLAN:   75 y.o. female with   1) Normocytic Anemia Likely multifactorial.   Primarily anemia of chronic disease from b/l  leg swelling and chronic venous stasis dermatitis. - patient recommended to f/u with PCP to optimize mx of her pedal edema and venous stasis dermatitis and wound care considerations. Some iron deficiency anemia Normal LDH - no evidence of hemolysis 09/04/18 Sed rate at 66 myeloma panel - no evidence of M spike  PLAN: -Discussed pt labwork today, 09/25/2020; blood count normal, chemistries stable, Ferritin decreasing, and Iron Sat down to 20%, Hgb nml. -Advised pt her Iron levels are decreasing slowly. Iron Sat at 20%. Recommend pt receive IV Iron to prevent anemia. The pt wishes to wait a month and get an infusion in March 2022. -Goal Ferritin >100, Ferritin today at 38. Continuing to slowly trend down. -Recommend more green-leafy vegetables and meats to naturally increase iron. -Continue SL 1045mcg Vitamin B12 daily  -Will see back in 8 months with labs. Will get IV Iron in 2 months.  FOLLOW UP: IV INjectafer x 1 dose in 2 months RTC with Dr Irene Limbo with  labs in 8 months    The total time spent in the appointment was 20 minutes and more than 50% was on counseling and direct patient cares.  All of the patient's questions were answered with apparent satisfaction. The patient knows to call the clinic with any problems, questions or concerns.    Sullivan Lone MD [place] AAHIVMS [facility] Hematology/Oncology Physician [facility]  (Office):       [phone] (Work cell):  [phone] (Fax):           [phone]  09/24/2020 6:03 PM  I, Reinaldo Raddle, am acting as scribe for Dr. Sullivan Lone, MD.    .I have reviewed the above documentation for accuracy and completeness, and I agree with the above. Brunetta Genera MD

## 2020-09-25 ENCOUNTER — Inpatient Hospital Stay: Payer: Medicare Other | Attending: Hematology

## 2020-09-25 ENCOUNTER — Other Ambulatory Visit: Payer: Self-pay

## 2020-09-25 ENCOUNTER — Inpatient Hospital Stay (HOSPITAL_BASED_OUTPATIENT_CLINIC_OR_DEPARTMENT_OTHER): Payer: Medicare Other | Admitting: Hematology

## 2020-09-25 VITALS — BP 159/79 | HR 87 | Temp 98.2°F | Resp 20 | Ht 62.0 in | Wt 187.3 lb

## 2020-09-25 DIAGNOSIS — M1990 Unspecified osteoarthritis, unspecified site: Secondary | ICD-10-CM | POA: Insufficient documentation

## 2020-09-25 DIAGNOSIS — F419 Anxiety disorder, unspecified: Secondary | ICD-10-CM | POA: Diagnosis not present

## 2020-09-25 DIAGNOSIS — E669 Obesity, unspecified: Secondary | ICD-10-CM | POA: Insufficient documentation

## 2020-09-25 DIAGNOSIS — E538 Deficiency of other specified B group vitamins: Secondary | ICD-10-CM

## 2020-09-25 DIAGNOSIS — E039 Hypothyroidism, unspecified: Secondary | ICD-10-CM | POA: Diagnosis not present

## 2020-09-25 DIAGNOSIS — Z79899 Other long term (current) drug therapy: Secondary | ICD-10-CM | POA: Diagnosis not present

## 2020-09-25 DIAGNOSIS — M109 Gout, unspecified: Secondary | ICD-10-CM | POA: Diagnosis not present

## 2020-09-25 DIAGNOSIS — Z803 Family history of malignant neoplasm of breast: Secondary | ICD-10-CM | POA: Insufficient documentation

## 2020-09-25 DIAGNOSIS — Z7984 Long term (current) use of oral hypoglycemic drugs: Secondary | ICD-10-CM | POA: Insufficient documentation

## 2020-09-25 DIAGNOSIS — D509 Iron deficiency anemia, unspecified: Secondary | ICD-10-CM | POA: Insufficient documentation

## 2020-09-25 DIAGNOSIS — Z87891 Personal history of nicotine dependence: Secondary | ICD-10-CM | POA: Diagnosis not present

## 2020-09-25 DIAGNOSIS — E785 Hyperlipidemia, unspecified: Secondary | ICD-10-CM | POA: Insufficient documentation

## 2020-09-25 DIAGNOSIS — R5383 Other fatigue: Secondary | ICD-10-CM | POA: Diagnosis not present

## 2020-09-25 DIAGNOSIS — J45909 Unspecified asthma, uncomplicated: Secondary | ICD-10-CM | POA: Diagnosis not present

## 2020-09-25 DIAGNOSIS — D649 Anemia, unspecified: Secondary | ICD-10-CM | POA: Insufficient documentation

## 2020-09-25 DIAGNOSIS — G4730 Sleep apnea, unspecified: Secondary | ICD-10-CM | POA: Diagnosis not present

## 2020-09-25 DIAGNOSIS — E119 Type 2 diabetes mellitus without complications: Secondary | ICD-10-CM | POA: Insufficient documentation

## 2020-09-25 DIAGNOSIS — I10 Essential (primary) hypertension: Secondary | ICD-10-CM | POA: Diagnosis not present

## 2020-09-25 LAB — CMP (CANCER CENTER ONLY)
ALT: 29 U/L (ref 0–44)
AST: 24 U/L (ref 15–41)
Albumin: 4 g/dL (ref 3.5–5.0)
Alkaline Phosphatase: 137 U/L — ABNORMAL HIGH (ref 38–126)
Anion gap: 10 (ref 5–15)
BUN: 34 mg/dL — ABNORMAL HIGH (ref 8–23)
CO2: 27 mmol/L (ref 22–32)
Calcium: 9.9 mg/dL (ref 8.9–10.3)
Chloride: 104 mmol/L (ref 98–111)
Creatinine: 1 mg/dL (ref 0.44–1.00)
GFR, Estimated: 59 mL/min — ABNORMAL LOW (ref >60)
Glucose, Bld: 140 mg/dL — ABNORMAL HIGH (ref 70–99)
Potassium: 4.3 mmol/L (ref 3.5–5.1)
Sodium: 141 mmol/L (ref 135–145)
Total Bilirubin: <0.2 mg/dL — ABNORMAL LOW (ref 0.3–1.2)
Total Protein: 7.6 g/dL (ref 6.5–8.1)

## 2020-09-25 LAB — CBC WITH DIFFERENTIAL/PLATELET
Abs Immature Granulocytes: 0.03 10*3/uL (ref 0.00–0.07)
Basophils Absolute: 0.1 10*3/uL (ref 0.0–0.1)
Basophils Relative: 1 %
Eosinophils Absolute: 0.1 10*3/uL (ref 0.0–0.5)
Eosinophils Relative: 1 %
HCT: 40.1 % (ref 36.0–46.0)
Hemoglobin: 13 g/dL (ref 12.0–15.0)
Immature Granulocytes: 0 %
Lymphocytes Relative: 17 %
Lymphs Abs: 1.7 10*3/uL (ref 0.7–4.0)
MCH: 29.3 pg (ref 26.0–34.0)
MCHC: 32.4 g/dL (ref 30.0–36.0)
MCV: 90.3 fL (ref 80.0–100.0)
Monocytes Absolute: 0.5 10*3/uL (ref 0.1–1.0)
Monocytes Relative: 5 %
Neutro Abs: 7.5 10*3/uL (ref 1.7–7.7)
Neutrophils Relative %: 76 %
Platelets: 262 10*3/uL (ref 150–400)
RBC: 4.44 MIL/uL (ref 3.87–5.11)
RDW: 12.7 % (ref 11.5–15.5)
WBC: 9.9 10*3/uL (ref 4.0–10.5)
nRBC: 0 % (ref 0.0–0.2)

## 2020-09-25 LAB — FERRITIN: Ferritin: 38 ng/mL (ref 11–307)

## 2020-09-25 LAB — IRON AND TIBC
Iron: 63 ug/dL (ref 41–142)
Saturation Ratios: 20 % — ABNORMAL LOW (ref 21–57)
TIBC: 316 ug/dL (ref 236–444)
UIBC: 253 ug/dL (ref 120–384)

## 2020-09-25 LAB — VITAMIN B12: Vitamin B-12: 2244 pg/mL — ABNORMAL HIGH (ref 180–914)

## 2020-09-30 DIAGNOSIS — J45909 Unspecified asthma, uncomplicated: Secondary | ICD-10-CM | POA: Diagnosis not present

## 2020-09-30 DIAGNOSIS — H409 Unspecified glaucoma: Secondary | ICD-10-CM | POA: Diagnosis not present

## 2020-09-30 DIAGNOSIS — I110 Hypertensive heart disease with heart failure: Secondary | ICD-10-CM | POA: Diagnosis not present

## 2020-09-30 DIAGNOSIS — I509 Heart failure, unspecified: Secondary | ICD-10-CM | POA: Diagnosis not present

## 2020-09-30 DIAGNOSIS — E039 Hypothyroidism, unspecified: Secondary | ICD-10-CM | POA: Diagnosis not present

## 2020-09-30 DIAGNOSIS — K5730 Diverticulosis of large intestine without perforation or abscess without bleeding: Secondary | ICD-10-CM | POA: Diagnosis not present

## 2020-09-30 DIAGNOSIS — H9190 Unspecified hearing loss, unspecified ear: Secondary | ICD-10-CM | POA: Diagnosis not present

## 2020-09-30 DIAGNOSIS — M150 Primary generalized (osteo)arthritis: Secondary | ICD-10-CM | POA: Diagnosis not present

## 2020-09-30 DIAGNOSIS — M064 Inflammatory polyarthropathy: Secondary | ICD-10-CM | POA: Diagnosis not present

## 2020-09-30 DIAGNOSIS — G8929 Other chronic pain: Secondary | ICD-10-CM | POA: Diagnosis not present

## 2020-09-30 DIAGNOSIS — I252 Old myocardial infarction: Secondary | ICD-10-CM | POA: Diagnosis not present

## 2020-09-30 DIAGNOSIS — K219 Gastro-esophageal reflux disease without esophagitis: Secondary | ICD-10-CM | POA: Diagnosis not present

## 2020-09-30 DIAGNOSIS — G4733 Obstructive sleep apnea (adult) (pediatric): Secondary | ICD-10-CM | POA: Diagnosis not present

## 2020-09-30 DIAGNOSIS — I2510 Atherosclerotic heart disease of native coronary artery without angina pectoris: Secondary | ICD-10-CM | POA: Diagnosis not present

## 2020-09-30 DIAGNOSIS — E11319 Type 2 diabetes mellitus with unspecified diabetic retinopathy without macular edema: Secondary | ICD-10-CM | POA: Diagnosis not present

## 2020-09-30 DIAGNOSIS — I480 Paroxysmal atrial fibrillation: Secondary | ICD-10-CM | POA: Diagnosis not present

## 2020-09-30 DIAGNOSIS — I872 Venous insufficiency (chronic) (peripheral): Secondary | ICD-10-CM | POA: Diagnosis not present

## 2020-09-30 DIAGNOSIS — E1151 Type 2 diabetes mellitus with diabetic peripheral angiopathy without gangrene: Secondary | ICD-10-CM | POA: Diagnosis not present

## 2020-09-30 DIAGNOSIS — D509 Iron deficiency anemia, unspecified: Secondary | ICD-10-CM | POA: Diagnosis not present

## 2020-09-30 DIAGNOSIS — G4700 Insomnia, unspecified: Secondary | ICD-10-CM | POA: Diagnosis not present

## 2020-09-30 DIAGNOSIS — E1142 Type 2 diabetes mellitus with diabetic polyneuropathy: Secondary | ICD-10-CM | POA: Diagnosis not present

## 2020-09-30 DIAGNOSIS — G43909 Migraine, unspecified, not intractable, without status migrainosus: Secondary | ICD-10-CM | POA: Diagnosis not present

## 2020-10-05 DIAGNOSIS — D509 Iron deficiency anemia, unspecified: Secondary | ICD-10-CM | POA: Diagnosis not present

## 2020-10-05 DIAGNOSIS — K219 Gastro-esophageal reflux disease without esophagitis: Secondary | ICD-10-CM | POA: Diagnosis not present

## 2020-10-05 DIAGNOSIS — I2510 Atherosclerotic heart disease of native coronary artery without angina pectoris: Secondary | ICD-10-CM | POA: Diagnosis not present

## 2020-10-05 DIAGNOSIS — I509 Heart failure, unspecified: Secondary | ICD-10-CM | POA: Diagnosis not present

## 2020-10-05 DIAGNOSIS — G43909 Migraine, unspecified, not intractable, without status migrainosus: Secondary | ICD-10-CM | POA: Diagnosis not present

## 2020-10-05 DIAGNOSIS — M064 Inflammatory polyarthropathy: Secondary | ICD-10-CM | POA: Diagnosis not present

## 2020-10-05 DIAGNOSIS — I252 Old myocardial infarction: Secondary | ICD-10-CM | POA: Diagnosis not present

## 2020-10-05 DIAGNOSIS — G8929 Other chronic pain: Secondary | ICD-10-CM | POA: Diagnosis not present

## 2020-10-05 DIAGNOSIS — I110 Hypertensive heart disease with heart failure: Secondary | ICD-10-CM | POA: Diagnosis not present

## 2020-10-05 DIAGNOSIS — I872 Venous insufficiency (chronic) (peripheral): Secondary | ICD-10-CM | POA: Diagnosis not present

## 2020-10-05 DIAGNOSIS — G4700 Insomnia, unspecified: Secondary | ICD-10-CM | POA: Diagnosis not present

## 2020-10-05 DIAGNOSIS — E11319 Type 2 diabetes mellitus with unspecified diabetic retinopathy without macular edema: Secondary | ICD-10-CM | POA: Diagnosis not present

## 2020-10-05 DIAGNOSIS — J45909 Unspecified asthma, uncomplicated: Secondary | ICD-10-CM | POA: Diagnosis not present

## 2020-10-05 DIAGNOSIS — E1151 Type 2 diabetes mellitus with diabetic peripheral angiopathy without gangrene: Secondary | ICD-10-CM | POA: Diagnosis not present

## 2020-10-05 DIAGNOSIS — H409 Unspecified glaucoma: Secondary | ICD-10-CM | POA: Diagnosis not present

## 2020-10-05 DIAGNOSIS — M150 Primary generalized (osteo)arthritis: Secondary | ICD-10-CM | POA: Diagnosis not present

## 2020-10-05 DIAGNOSIS — H9190 Unspecified hearing loss, unspecified ear: Secondary | ICD-10-CM | POA: Diagnosis not present

## 2020-10-05 DIAGNOSIS — I480 Paroxysmal atrial fibrillation: Secondary | ICD-10-CM | POA: Diagnosis not present

## 2020-10-05 DIAGNOSIS — E1142 Type 2 diabetes mellitus with diabetic polyneuropathy: Secondary | ICD-10-CM | POA: Diagnosis not present

## 2020-10-05 DIAGNOSIS — K5730 Diverticulosis of large intestine without perforation or abscess without bleeding: Secondary | ICD-10-CM | POA: Diagnosis not present

## 2020-10-05 DIAGNOSIS — G4733 Obstructive sleep apnea (adult) (pediatric): Secondary | ICD-10-CM | POA: Diagnosis not present

## 2020-10-05 DIAGNOSIS — E039 Hypothyroidism, unspecified: Secondary | ICD-10-CM | POA: Diagnosis not present

## 2020-10-09 DIAGNOSIS — I110 Hypertensive heart disease with heart failure: Secondary | ICD-10-CM | POA: Diagnosis not present

## 2020-10-09 DIAGNOSIS — I872 Venous insufficiency (chronic) (peripheral): Secondary | ICD-10-CM | POA: Diagnosis not present

## 2020-10-09 DIAGNOSIS — G4733 Obstructive sleep apnea (adult) (pediatric): Secondary | ICD-10-CM | POA: Diagnosis not present

## 2020-10-09 DIAGNOSIS — D509 Iron deficiency anemia, unspecified: Secondary | ICD-10-CM | POA: Diagnosis not present

## 2020-10-09 DIAGNOSIS — I252 Old myocardial infarction: Secondary | ICD-10-CM | POA: Diagnosis not present

## 2020-10-09 DIAGNOSIS — E039 Hypothyroidism, unspecified: Secondary | ICD-10-CM | POA: Diagnosis not present

## 2020-10-09 DIAGNOSIS — J45909 Unspecified asthma, uncomplicated: Secondary | ICD-10-CM | POA: Diagnosis not present

## 2020-10-09 DIAGNOSIS — G43909 Migraine, unspecified, not intractable, without status migrainosus: Secondary | ICD-10-CM | POA: Diagnosis not present

## 2020-10-09 DIAGNOSIS — M064 Inflammatory polyarthropathy: Secondary | ICD-10-CM | POA: Diagnosis not present

## 2020-10-09 DIAGNOSIS — E11319 Type 2 diabetes mellitus with unspecified diabetic retinopathy without macular edema: Secondary | ICD-10-CM | POA: Diagnosis not present

## 2020-10-09 DIAGNOSIS — H409 Unspecified glaucoma: Secondary | ICD-10-CM | POA: Diagnosis not present

## 2020-10-09 DIAGNOSIS — E1142 Type 2 diabetes mellitus with diabetic polyneuropathy: Secondary | ICD-10-CM | POA: Diagnosis not present

## 2020-10-09 DIAGNOSIS — K5730 Diverticulosis of large intestine without perforation or abscess without bleeding: Secondary | ICD-10-CM | POA: Diagnosis not present

## 2020-10-09 DIAGNOSIS — I480 Paroxysmal atrial fibrillation: Secondary | ICD-10-CM | POA: Diagnosis not present

## 2020-10-09 DIAGNOSIS — H9190 Unspecified hearing loss, unspecified ear: Secondary | ICD-10-CM | POA: Diagnosis not present

## 2020-10-09 DIAGNOSIS — E1151 Type 2 diabetes mellitus with diabetic peripheral angiopathy without gangrene: Secondary | ICD-10-CM | POA: Diagnosis not present

## 2020-10-09 DIAGNOSIS — I509 Heart failure, unspecified: Secondary | ICD-10-CM | POA: Diagnosis not present

## 2020-10-09 DIAGNOSIS — I2510 Atherosclerotic heart disease of native coronary artery without angina pectoris: Secondary | ICD-10-CM | POA: Diagnosis not present

## 2020-10-09 DIAGNOSIS — K219 Gastro-esophageal reflux disease without esophagitis: Secondary | ICD-10-CM | POA: Diagnosis not present

## 2020-10-09 DIAGNOSIS — M150 Primary generalized (osteo)arthritis: Secondary | ICD-10-CM | POA: Diagnosis not present

## 2020-10-09 DIAGNOSIS — G8929 Other chronic pain: Secondary | ICD-10-CM | POA: Diagnosis not present

## 2020-10-09 DIAGNOSIS — G4700 Insomnia, unspecified: Secondary | ICD-10-CM | POA: Diagnosis not present

## 2020-10-12 DIAGNOSIS — M064 Inflammatory polyarthropathy: Secondary | ICD-10-CM | POA: Diagnosis not present

## 2020-10-12 DIAGNOSIS — M150 Primary generalized (osteo)arthritis: Secondary | ICD-10-CM | POA: Diagnosis not present

## 2020-10-12 DIAGNOSIS — J45909 Unspecified asthma, uncomplicated: Secondary | ICD-10-CM | POA: Diagnosis not present

## 2020-10-12 DIAGNOSIS — I509 Heart failure, unspecified: Secondary | ICD-10-CM | POA: Diagnosis not present

## 2020-10-12 DIAGNOSIS — I872 Venous insufficiency (chronic) (peripheral): Secondary | ICD-10-CM | POA: Diagnosis not present

## 2020-10-12 DIAGNOSIS — E11319 Type 2 diabetes mellitus with unspecified diabetic retinopathy without macular edema: Secondary | ICD-10-CM | POA: Diagnosis not present

## 2020-10-12 DIAGNOSIS — H409 Unspecified glaucoma: Secondary | ICD-10-CM | POA: Diagnosis not present

## 2020-10-12 DIAGNOSIS — E1151 Type 2 diabetes mellitus with diabetic peripheral angiopathy without gangrene: Secondary | ICD-10-CM | POA: Diagnosis not present

## 2020-10-12 DIAGNOSIS — G8929 Other chronic pain: Secondary | ICD-10-CM | POA: Diagnosis not present

## 2020-10-12 DIAGNOSIS — I252 Old myocardial infarction: Secondary | ICD-10-CM | POA: Diagnosis not present

## 2020-10-12 DIAGNOSIS — G4733 Obstructive sleep apnea (adult) (pediatric): Secondary | ICD-10-CM | POA: Diagnosis not present

## 2020-10-12 DIAGNOSIS — K219 Gastro-esophageal reflux disease without esophagitis: Secondary | ICD-10-CM | POA: Diagnosis not present

## 2020-10-12 DIAGNOSIS — H9190 Unspecified hearing loss, unspecified ear: Secondary | ICD-10-CM | POA: Diagnosis not present

## 2020-10-12 DIAGNOSIS — I480 Paroxysmal atrial fibrillation: Secondary | ICD-10-CM | POA: Diagnosis not present

## 2020-10-12 DIAGNOSIS — G43909 Migraine, unspecified, not intractable, without status migrainosus: Secondary | ICD-10-CM | POA: Diagnosis not present

## 2020-10-12 DIAGNOSIS — E1142 Type 2 diabetes mellitus with diabetic polyneuropathy: Secondary | ICD-10-CM | POA: Diagnosis not present

## 2020-10-12 DIAGNOSIS — K5730 Diverticulosis of large intestine without perforation or abscess without bleeding: Secondary | ICD-10-CM | POA: Diagnosis not present

## 2020-10-12 DIAGNOSIS — E039 Hypothyroidism, unspecified: Secondary | ICD-10-CM | POA: Diagnosis not present

## 2020-10-12 DIAGNOSIS — D509 Iron deficiency anemia, unspecified: Secondary | ICD-10-CM | POA: Diagnosis not present

## 2020-10-12 DIAGNOSIS — I110 Hypertensive heart disease with heart failure: Secondary | ICD-10-CM | POA: Diagnosis not present

## 2020-10-12 DIAGNOSIS — G4700 Insomnia, unspecified: Secondary | ICD-10-CM | POA: Diagnosis not present

## 2020-10-12 DIAGNOSIS — I2510 Atherosclerotic heart disease of native coronary artery without angina pectoris: Secondary | ICD-10-CM | POA: Diagnosis not present

## 2020-10-13 ENCOUNTER — Other Ambulatory Visit: Payer: Self-pay | Admitting: Family Medicine

## 2020-10-13 DIAGNOSIS — E2839 Other primary ovarian failure: Secondary | ICD-10-CM

## 2020-10-15 DIAGNOSIS — G43909 Migraine, unspecified, not intractable, without status migrainosus: Secondary | ICD-10-CM | POA: Diagnosis not present

## 2020-10-15 DIAGNOSIS — H9190 Unspecified hearing loss, unspecified ear: Secondary | ICD-10-CM | POA: Diagnosis not present

## 2020-10-15 DIAGNOSIS — I872 Venous insufficiency (chronic) (peripheral): Secondary | ICD-10-CM | POA: Diagnosis not present

## 2020-10-15 DIAGNOSIS — K5730 Diverticulosis of large intestine without perforation or abscess without bleeding: Secondary | ICD-10-CM | POA: Diagnosis not present

## 2020-10-15 DIAGNOSIS — M150 Primary generalized (osteo)arthritis: Secondary | ICD-10-CM | POA: Diagnosis not present

## 2020-10-15 DIAGNOSIS — G4733 Obstructive sleep apnea (adult) (pediatric): Secondary | ICD-10-CM | POA: Diagnosis not present

## 2020-10-15 DIAGNOSIS — I2510 Atherosclerotic heart disease of native coronary artery without angina pectoris: Secondary | ICD-10-CM | POA: Diagnosis not present

## 2020-10-15 DIAGNOSIS — E039 Hypothyroidism, unspecified: Secondary | ICD-10-CM | POA: Diagnosis not present

## 2020-10-15 DIAGNOSIS — G8929 Other chronic pain: Secondary | ICD-10-CM | POA: Diagnosis not present

## 2020-10-15 DIAGNOSIS — E1142 Type 2 diabetes mellitus with diabetic polyneuropathy: Secondary | ICD-10-CM | POA: Diagnosis not present

## 2020-10-15 DIAGNOSIS — K219 Gastro-esophageal reflux disease without esophagitis: Secondary | ICD-10-CM | POA: Diagnosis not present

## 2020-10-15 DIAGNOSIS — M064 Inflammatory polyarthropathy: Secondary | ICD-10-CM | POA: Diagnosis not present

## 2020-10-15 DIAGNOSIS — I110 Hypertensive heart disease with heart failure: Secondary | ICD-10-CM | POA: Diagnosis not present

## 2020-10-15 DIAGNOSIS — H409 Unspecified glaucoma: Secondary | ICD-10-CM | POA: Diagnosis not present

## 2020-10-15 DIAGNOSIS — D509 Iron deficiency anemia, unspecified: Secondary | ICD-10-CM | POA: Diagnosis not present

## 2020-10-15 DIAGNOSIS — I480 Paroxysmal atrial fibrillation: Secondary | ICD-10-CM | POA: Diagnosis not present

## 2020-10-15 DIAGNOSIS — I509 Heart failure, unspecified: Secondary | ICD-10-CM | POA: Diagnosis not present

## 2020-10-15 DIAGNOSIS — J45909 Unspecified asthma, uncomplicated: Secondary | ICD-10-CM | POA: Diagnosis not present

## 2020-10-15 DIAGNOSIS — E1151 Type 2 diabetes mellitus with diabetic peripheral angiopathy without gangrene: Secondary | ICD-10-CM | POA: Diagnosis not present

## 2020-10-15 DIAGNOSIS — G4700 Insomnia, unspecified: Secondary | ICD-10-CM | POA: Diagnosis not present

## 2020-10-15 DIAGNOSIS — E11319 Type 2 diabetes mellitus with unspecified diabetic retinopathy without macular edema: Secondary | ICD-10-CM | POA: Diagnosis not present

## 2020-10-15 DIAGNOSIS — I252 Old myocardial infarction: Secondary | ICD-10-CM | POA: Diagnosis not present

## 2020-10-16 ENCOUNTER — Other Ambulatory Visit: Payer: Medicare Other

## 2020-10-19 DIAGNOSIS — H9190 Unspecified hearing loss, unspecified ear: Secondary | ICD-10-CM | POA: Diagnosis not present

## 2020-10-19 DIAGNOSIS — K219 Gastro-esophageal reflux disease without esophagitis: Secondary | ICD-10-CM | POA: Diagnosis not present

## 2020-10-19 DIAGNOSIS — I110 Hypertensive heart disease with heart failure: Secondary | ICD-10-CM | POA: Diagnosis not present

## 2020-10-19 DIAGNOSIS — H409 Unspecified glaucoma: Secondary | ICD-10-CM | POA: Diagnosis not present

## 2020-10-19 DIAGNOSIS — E039 Hypothyroidism, unspecified: Secondary | ICD-10-CM | POA: Diagnosis not present

## 2020-10-19 DIAGNOSIS — D509 Iron deficiency anemia, unspecified: Secondary | ICD-10-CM | POA: Diagnosis not present

## 2020-10-19 DIAGNOSIS — M064 Inflammatory polyarthropathy: Secondary | ICD-10-CM | POA: Diagnosis not present

## 2020-10-19 DIAGNOSIS — J45909 Unspecified asthma, uncomplicated: Secondary | ICD-10-CM | POA: Diagnosis not present

## 2020-10-19 DIAGNOSIS — I2510 Atherosclerotic heart disease of native coronary artery without angina pectoris: Secondary | ICD-10-CM | POA: Diagnosis not present

## 2020-10-19 DIAGNOSIS — G43909 Migraine, unspecified, not intractable, without status migrainosus: Secondary | ICD-10-CM | POA: Diagnosis not present

## 2020-10-19 DIAGNOSIS — I480 Paroxysmal atrial fibrillation: Secondary | ICD-10-CM | POA: Diagnosis not present

## 2020-10-19 DIAGNOSIS — I509 Heart failure, unspecified: Secondary | ICD-10-CM | POA: Diagnosis not present

## 2020-10-19 DIAGNOSIS — M150 Primary generalized (osteo)arthritis: Secondary | ICD-10-CM | POA: Diagnosis not present

## 2020-10-19 DIAGNOSIS — E1142 Type 2 diabetes mellitus with diabetic polyneuropathy: Secondary | ICD-10-CM | POA: Diagnosis not present

## 2020-10-19 DIAGNOSIS — E11319 Type 2 diabetes mellitus with unspecified diabetic retinopathy without macular edema: Secondary | ICD-10-CM | POA: Diagnosis not present

## 2020-10-19 DIAGNOSIS — I252 Old myocardial infarction: Secondary | ICD-10-CM | POA: Diagnosis not present

## 2020-10-19 DIAGNOSIS — G4733 Obstructive sleep apnea (adult) (pediatric): Secondary | ICD-10-CM | POA: Diagnosis not present

## 2020-10-19 DIAGNOSIS — G4700 Insomnia, unspecified: Secondary | ICD-10-CM | POA: Diagnosis not present

## 2020-10-19 DIAGNOSIS — I872 Venous insufficiency (chronic) (peripheral): Secondary | ICD-10-CM | POA: Diagnosis not present

## 2020-10-19 DIAGNOSIS — E1151 Type 2 diabetes mellitus with diabetic peripheral angiopathy without gangrene: Secondary | ICD-10-CM | POA: Diagnosis not present

## 2020-10-19 DIAGNOSIS — K5730 Diverticulosis of large intestine without perforation or abscess without bleeding: Secondary | ICD-10-CM | POA: Diagnosis not present

## 2020-10-19 DIAGNOSIS — G8929 Other chronic pain: Secondary | ICD-10-CM | POA: Diagnosis not present

## 2020-10-21 ENCOUNTER — Other Ambulatory Visit: Payer: Self-pay

## 2020-10-21 ENCOUNTER — Encounter: Payer: Self-pay | Admitting: Podiatry

## 2020-10-21 ENCOUNTER — Ambulatory Visit (INDEPENDENT_AMBULATORY_CARE_PROVIDER_SITE_OTHER): Payer: Medicare Other | Admitting: Podiatry

## 2020-10-21 DIAGNOSIS — E782 Mixed hyperlipidemia: Secondary | ICD-10-CM | POA: Insufficient documentation

## 2020-10-21 DIAGNOSIS — E2839 Other primary ovarian failure: Secondary | ICD-10-CM | POA: Insufficient documentation

## 2020-10-21 DIAGNOSIS — H9190 Unspecified hearing loss, unspecified ear: Secondary | ICD-10-CM | POA: Insufficient documentation

## 2020-10-21 DIAGNOSIS — K5730 Diverticulosis of large intestine without perforation or abscess without bleeding: Secondary | ICD-10-CM | POA: Insufficient documentation

## 2020-10-21 DIAGNOSIS — Q828 Other specified congenital malformations of skin: Secondary | ICD-10-CM | POA: Diagnosis not present

## 2020-10-21 DIAGNOSIS — M064 Inflammatory polyarthropathy: Secondary | ICD-10-CM | POA: Insufficient documentation

## 2020-10-21 DIAGNOSIS — B351 Tinea unguium: Secondary | ICD-10-CM

## 2020-10-21 DIAGNOSIS — I872 Venous insufficiency (chronic) (peripheral): Secondary | ICD-10-CM | POA: Insufficient documentation

## 2020-10-21 DIAGNOSIS — K5900 Constipation, unspecified: Secondary | ICD-10-CM | POA: Insufficient documentation

## 2020-10-21 DIAGNOSIS — M79675 Pain in left toe(s): Secondary | ICD-10-CM

## 2020-10-21 DIAGNOSIS — E11319 Type 2 diabetes mellitus with unspecified diabetic retinopathy without macular edema: Secondary | ICD-10-CM | POA: Insufficient documentation

## 2020-10-21 DIAGNOSIS — M79674 Pain in right toe(s): Secondary | ICD-10-CM

## 2020-10-21 DIAGNOSIS — M722 Plantar fascial fibromatosis: Secondary | ICD-10-CM

## 2020-10-21 DIAGNOSIS — E785 Hyperlipidemia, unspecified: Secondary | ICD-10-CM | POA: Insufficient documentation

## 2020-10-21 DIAGNOSIS — G43009 Migraine without aura, not intractable, without status migrainosus: Secondary | ICD-10-CM | POA: Insufficient documentation

## 2020-10-21 DIAGNOSIS — H409 Unspecified glaucoma: Secondary | ICD-10-CM | POA: Insufficient documentation

## 2020-10-21 DIAGNOSIS — G629 Polyneuropathy, unspecified: Secondary | ICD-10-CM | POA: Insufficient documentation

## 2020-10-21 DIAGNOSIS — M1990 Unspecified osteoarthritis, unspecified site: Secondary | ICD-10-CM | POA: Insufficient documentation

## 2020-10-21 NOTE — Progress Notes (Signed)
This patient returns to my office for at risk foot care.  This patient requires this care by a professional since this patient will be at risk due to having diabetes and chronic kidney disease.  This patient is unable to cut nails herself since the patient cannot reach her nails.These nails are painful walking and wearing shoes.  She has a painful callus under her big toe joint right foot.  This is painful walking and wearing her shoes.  She also has pain in her right heel for the last four days.  No injury or treatment provided.  This patient presents for at risk foot care today.  General Appearance  Alert, conversant and in no acute stress.  Vascular  Dorsalis pedis and posterior tibial  pulses are palpable  bilaterally.  Capillary return is within normal limits  bilaterally. Temperature is within normal limits  bilaterally.  Neurologic  Senn-Weinstein monofilament wire test within normal limits  bilaterally. Muscle power within normal limits bilaterally.  Nails Thick disfigured discolored nails with subungual debris  from hallux to fifth toes bilaterally. No evidence of bacterial infection or drainage bilaterally.  Orthopedic  No limitations of motion  feet .  No crepitus or effusions noted.  No bony pathology or digital deformities noted.  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  Palpable pain at the insertion plantar fascia right heel.  Skin  normotropic skin  noted bilaterally.  No signs of infections or ulcers noted.  Callus sub 1st MPJ right foot.   Onychomycosis  Pain in right toes  Pain in left toes  Callus sub 1 right foot.  Plantar fasciitis right.  Consent was obtained for treatment procedures.   Mechanical debridement of nails 1-5  bilaterally performed with a nail nipper.  Filed with dremel without incident.  Debridement of callus with # 15 blade.  Padding added to both callus and heel on diabetic insole.  She says she is painfree leaving the treatment room.   Return office visit  3 months                    Told patient to return for periodic foot care and evaluation due to potential at risk complications.   Gardiner Barefoot DPM

## 2020-10-22 DIAGNOSIS — E039 Hypothyroidism, unspecified: Secondary | ICD-10-CM | POA: Diagnosis not present

## 2020-10-22 DIAGNOSIS — G8929 Other chronic pain: Secondary | ICD-10-CM | POA: Diagnosis not present

## 2020-10-22 DIAGNOSIS — G4700 Insomnia, unspecified: Secondary | ICD-10-CM | POA: Diagnosis not present

## 2020-10-22 DIAGNOSIS — G4733 Obstructive sleep apnea (adult) (pediatric): Secondary | ICD-10-CM | POA: Diagnosis not present

## 2020-10-22 DIAGNOSIS — I252 Old myocardial infarction: Secondary | ICD-10-CM | POA: Diagnosis not present

## 2020-10-22 DIAGNOSIS — I872 Venous insufficiency (chronic) (peripheral): Secondary | ICD-10-CM | POA: Diagnosis not present

## 2020-10-22 DIAGNOSIS — M064 Inflammatory polyarthropathy: Secondary | ICD-10-CM | POA: Diagnosis not present

## 2020-10-22 DIAGNOSIS — K219 Gastro-esophageal reflux disease without esophagitis: Secondary | ICD-10-CM | POA: Diagnosis not present

## 2020-10-22 DIAGNOSIS — I509 Heart failure, unspecified: Secondary | ICD-10-CM | POA: Diagnosis not present

## 2020-10-22 DIAGNOSIS — M150 Primary generalized (osteo)arthritis: Secondary | ICD-10-CM | POA: Diagnosis not present

## 2020-10-22 DIAGNOSIS — I480 Paroxysmal atrial fibrillation: Secondary | ICD-10-CM | POA: Diagnosis not present

## 2020-10-22 DIAGNOSIS — G43909 Migraine, unspecified, not intractable, without status migrainosus: Secondary | ICD-10-CM | POA: Diagnosis not present

## 2020-10-22 DIAGNOSIS — D509 Iron deficiency anemia, unspecified: Secondary | ICD-10-CM | POA: Diagnosis not present

## 2020-10-22 DIAGNOSIS — E11319 Type 2 diabetes mellitus with unspecified diabetic retinopathy without macular edema: Secondary | ICD-10-CM | POA: Diagnosis not present

## 2020-10-22 DIAGNOSIS — E1142 Type 2 diabetes mellitus with diabetic polyneuropathy: Secondary | ICD-10-CM | POA: Diagnosis not present

## 2020-10-22 DIAGNOSIS — H9190 Unspecified hearing loss, unspecified ear: Secondary | ICD-10-CM | POA: Diagnosis not present

## 2020-10-22 DIAGNOSIS — E1151 Type 2 diabetes mellitus with diabetic peripheral angiopathy without gangrene: Secondary | ICD-10-CM | POA: Diagnosis not present

## 2020-10-22 DIAGNOSIS — J45909 Unspecified asthma, uncomplicated: Secondary | ICD-10-CM | POA: Diagnosis not present

## 2020-10-22 DIAGNOSIS — H409 Unspecified glaucoma: Secondary | ICD-10-CM | POA: Diagnosis not present

## 2020-10-22 DIAGNOSIS — K5730 Diverticulosis of large intestine without perforation or abscess without bleeding: Secondary | ICD-10-CM | POA: Diagnosis not present

## 2020-10-22 DIAGNOSIS — I110 Hypertensive heart disease with heart failure: Secondary | ICD-10-CM | POA: Diagnosis not present

## 2020-10-22 DIAGNOSIS — I2510 Atherosclerotic heart disease of native coronary artery without angina pectoris: Secondary | ICD-10-CM | POA: Diagnosis not present

## 2020-10-24 ENCOUNTER — Other Ambulatory Visit: Payer: Self-pay

## 2020-10-24 ENCOUNTER — Ambulatory Visit
Admission: RE | Admit: 2020-10-24 | Discharge: 2020-10-24 | Disposition: A | Discharge status: Home / Self Care | Payer: Medicare Other | Source: Ambulatory Visit | Attending: Family Medicine | Admitting: Family Medicine

## 2020-10-24 DIAGNOSIS — E2839 Other primary ovarian failure: Secondary | ICD-10-CM

## 2020-10-24 DIAGNOSIS — M85852 Other specified disorders of bone density and structure, left thigh: Secondary | ICD-10-CM | POA: Diagnosis not present

## 2020-10-24 DIAGNOSIS — Z780 Asymptomatic menopausal state: Secondary | ICD-10-CM | POA: Diagnosis not present

## 2020-10-27 DIAGNOSIS — J45909 Unspecified asthma, uncomplicated: Secondary | ICD-10-CM | POA: Diagnosis not present

## 2020-10-27 DIAGNOSIS — M150 Primary generalized (osteo)arthritis: Secondary | ICD-10-CM | POA: Diagnosis not present

## 2020-10-27 DIAGNOSIS — G4733 Obstructive sleep apnea (adult) (pediatric): Secondary | ICD-10-CM | POA: Diagnosis not present

## 2020-10-27 DIAGNOSIS — E039 Hypothyroidism, unspecified: Secondary | ICD-10-CM | POA: Diagnosis not present

## 2020-10-27 DIAGNOSIS — E1151 Type 2 diabetes mellitus with diabetic peripheral angiopathy without gangrene: Secondary | ICD-10-CM | POA: Diagnosis not present

## 2020-10-27 DIAGNOSIS — H409 Unspecified glaucoma: Secondary | ICD-10-CM | POA: Diagnosis not present

## 2020-10-27 DIAGNOSIS — G43909 Migraine, unspecified, not intractable, without status migrainosus: Secondary | ICD-10-CM | POA: Diagnosis not present

## 2020-10-27 DIAGNOSIS — K219 Gastro-esophageal reflux disease without esophagitis: Secondary | ICD-10-CM | POA: Diagnosis not present

## 2020-10-27 DIAGNOSIS — I2510 Atherosclerotic heart disease of native coronary artery without angina pectoris: Secondary | ICD-10-CM | POA: Diagnosis not present

## 2020-10-27 DIAGNOSIS — E11319 Type 2 diabetes mellitus with unspecified diabetic retinopathy without macular edema: Secondary | ICD-10-CM | POA: Diagnosis not present

## 2020-10-27 DIAGNOSIS — E1142 Type 2 diabetes mellitus with diabetic polyneuropathy: Secondary | ICD-10-CM | POA: Diagnosis not present

## 2020-10-27 DIAGNOSIS — K5730 Diverticulosis of large intestine without perforation or abscess without bleeding: Secondary | ICD-10-CM | POA: Diagnosis not present

## 2020-10-27 DIAGNOSIS — I480 Paroxysmal atrial fibrillation: Secondary | ICD-10-CM | POA: Diagnosis not present

## 2020-10-27 DIAGNOSIS — I110 Hypertensive heart disease with heart failure: Secondary | ICD-10-CM | POA: Diagnosis not present

## 2020-10-27 DIAGNOSIS — G8929 Other chronic pain: Secondary | ICD-10-CM | POA: Diagnosis not present

## 2020-10-27 DIAGNOSIS — D509 Iron deficiency anemia, unspecified: Secondary | ICD-10-CM | POA: Diagnosis not present

## 2020-10-27 DIAGNOSIS — H9190 Unspecified hearing loss, unspecified ear: Secondary | ICD-10-CM | POA: Diagnosis not present

## 2020-10-27 DIAGNOSIS — M064 Inflammatory polyarthropathy: Secondary | ICD-10-CM | POA: Diagnosis not present

## 2020-10-27 DIAGNOSIS — G4700 Insomnia, unspecified: Secondary | ICD-10-CM | POA: Diagnosis not present

## 2020-10-27 DIAGNOSIS — I509 Heart failure, unspecified: Secondary | ICD-10-CM | POA: Diagnosis not present

## 2020-10-27 DIAGNOSIS — I252 Old myocardial infarction: Secondary | ICD-10-CM | POA: Diagnosis not present

## 2020-10-27 DIAGNOSIS — I872 Venous insufficiency (chronic) (peripheral): Secondary | ICD-10-CM | POA: Diagnosis not present

## 2020-10-28 DIAGNOSIS — I110 Hypertensive heart disease with heart failure: Secondary | ICD-10-CM | POA: Diagnosis not present

## 2020-10-28 DIAGNOSIS — G4700 Insomnia, unspecified: Secondary | ICD-10-CM | POA: Diagnosis not present

## 2020-10-28 DIAGNOSIS — I872 Venous insufficiency (chronic) (peripheral): Secondary | ICD-10-CM | POA: Diagnosis not present

## 2020-10-28 DIAGNOSIS — I2510 Atherosclerotic heart disease of native coronary artery without angina pectoris: Secondary | ICD-10-CM | POA: Diagnosis not present

## 2020-10-28 DIAGNOSIS — E039 Hypothyroidism, unspecified: Secondary | ICD-10-CM | POA: Diagnosis not present

## 2020-10-28 DIAGNOSIS — I480 Paroxysmal atrial fibrillation: Secondary | ICD-10-CM | POA: Diagnosis not present

## 2020-10-28 DIAGNOSIS — D509 Iron deficiency anemia, unspecified: Secondary | ICD-10-CM | POA: Diagnosis not present

## 2020-10-28 DIAGNOSIS — J45909 Unspecified asthma, uncomplicated: Secondary | ICD-10-CM | POA: Diagnosis not present

## 2020-10-28 DIAGNOSIS — K219 Gastro-esophageal reflux disease without esophagitis: Secondary | ICD-10-CM | POA: Diagnosis not present

## 2020-10-28 DIAGNOSIS — I509 Heart failure, unspecified: Secondary | ICD-10-CM | POA: Diagnosis not present

## 2020-10-28 DIAGNOSIS — G4733 Obstructive sleep apnea (adult) (pediatric): Secondary | ICD-10-CM | POA: Diagnosis not present

## 2020-10-28 DIAGNOSIS — H409 Unspecified glaucoma: Secondary | ICD-10-CM | POA: Diagnosis not present

## 2020-10-28 DIAGNOSIS — K5730 Diverticulosis of large intestine without perforation or abscess without bleeding: Secondary | ICD-10-CM | POA: Diagnosis not present

## 2020-10-28 DIAGNOSIS — I252 Old myocardial infarction: Secondary | ICD-10-CM | POA: Diagnosis not present

## 2020-10-28 DIAGNOSIS — E1142 Type 2 diabetes mellitus with diabetic polyneuropathy: Secondary | ICD-10-CM | POA: Diagnosis not present

## 2020-10-28 DIAGNOSIS — M150 Primary generalized (osteo)arthritis: Secondary | ICD-10-CM | POA: Diagnosis not present

## 2020-10-28 DIAGNOSIS — G8929 Other chronic pain: Secondary | ICD-10-CM | POA: Diagnosis not present

## 2020-10-28 DIAGNOSIS — M064 Inflammatory polyarthropathy: Secondary | ICD-10-CM | POA: Diagnosis not present

## 2020-10-28 DIAGNOSIS — H9190 Unspecified hearing loss, unspecified ear: Secondary | ICD-10-CM | POA: Diagnosis not present

## 2020-10-28 DIAGNOSIS — E1151 Type 2 diabetes mellitus with diabetic peripheral angiopathy without gangrene: Secondary | ICD-10-CM | POA: Diagnosis not present

## 2020-10-28 DIAGNOSIS — G43909 Migraine, unspecified, not intractable, without status migrainosus: Secondary | ICD-10-CM | POA: Diagnosis not present

## 2020-10-28 DIAGNOSIS — E11319 Type 2 diabetes mellitus with unspecified diabetic retinopathy without macular edema: Secondary | ICD-10-CM | POA: Diagnosis not present

## 2020-11-02 DIAGNOSIS — K5730 Diverticulosis of large intestine without perforation or abscess without bleeding: Secondary | ICD-10-CM | POA: Diagnosis not present

## 2020-11-02 DIAGNOSIS — H9190 Unspecified hearing loss, unspecified ear: Secondary | ICD-10-CM | POA: Diagnosis not present

## 2020-11-02 DIAGNOSIS — I252 Old myocardial infarction: Secondary | ICD-10-CM | POA: Diagnosis not present

## 2020-11-02 DIAGNOSIS — E1151 Type 2 diabetes mellitus with diabetic peripheral angiopathy without gangrene: Secondary | ICD-10-CM | POA: Diagnosis not present

## 2020-11-02 DIAGNOSIS — D509 Iron deficiency anemia, unspecified: Secondary | ICD-10-CM | POA: Diagnosis not present

## 2020-11-02 DIAGNOSIS — E039 Hypothyroidism, unspecified: Secondary | ICD-10-CM | POA: Diagnosis not present

## 2020-11-02 DIAGNOSIS — I872 Venous insufficiency (chronic) (peripheral): Secondary | ICD-10-CM | POA: Diagnosis not present

## 2020-11-02 DIAGNOSIS — K219 Gastro-esophageal reflux disease without esophagitis: Secondary | ICD-10-CM | POA: Diagnosis not present

## 2020-11-02 DIAGNOSIS — G4733 Obstructive sleep apnea (adult) (pediatric): Secondary | ICD-10-CM | POA: Diagnosis not present

## 2020-11-02 DIAGNOSIS — H409 Unspecified glaucoma: Secondary | ICD-10-CM | POA: Diagnosis not present

## 2020-11-02 DIAGNOSIS — I110 Hypertensive heart disease with heart failure: Secondary | ICD-10-CM | POA: Diagnosis not present

## 2020-11-02 DIAGNOSIS — I2510 Atherosclerotic heart disease of native coronary artery without angina pectoris: Secondary | ICD-10-CM | POA: Diagnosis not present

## 2020-11-02 DIAGNOSIS — I480 Paroxysmal atrial fibrillation: Secondary | ICD-10-CM | POA: Diagnosis not present

## 2020-11-02 DIAGNOSIS — G4700 Insomnia, unspecified: Secondary | ICD-10-CM | POA: Diagnosis not present

## 2020-11-02 DIAGNOSIS — E1142 Type 2 diabetes mellitus with diabetic polyneuropathy: Secondary | ICD-10-CM | POA: Diagnosis not present

## 2020-11-02 DIAGNOSIS — M150 Primary generalized (osteo)arthritis: Secondary | ICD-10-CM | POA: Diagnosis not present

## 2020-11-02 DIAGNOSIS — G8929 Other chronic pain: Secondary | ICD-10-CM | POA: Diagnosis not present

## 2020-11-02 DIAGNOSIS — M064 Inflammatory polyarthropathy: Secondary | ICD-10-CM | POA: Diagnosis not present

## 2020-11-02 DIAGNOSIS — J45909 Unspecified asthma, uncomplicated: Secondary | ICD-10-CM | POA: Diagnosis not present

## 2020-11-02 DIAGNOSIS — E11319 Type 2 diabetes mellitus with unspecified diabetic retinopathy without macular edema: Secondary | ICD-10-CM | POA: Diagnosis not present

## 2020-11-02 DIAGNOSIS — G43909 Migraine, unspecified, not intractable, without status migrainosus: Secondary | ICD-10-CM | POA: Diagnosis not present

## 2020-11-02 DIAGNOSIS — I509 Heart failure, unspecified: Secondary | ICD-10-CM | POA: Diagnosis not present

## 2020-11-09 DIAGNOSIS — D509 Iron deficiency anemia, unspecified: Secondary | ICD-10-CM | POA: Diagnosis not present

## 2020-11-09 DIAGNOSIS — M064 Inflammatory polyarthropathy: Secondary | ICD-10-CM | POA: Diagnosis not present

## 2020-11-09 DIAGNOSIS — G4700 Insomnia, unspecified: Secondary | ICD-10-CM | POA: Diagnosis not present

## 2020-11-09 DIAGNOSIS — I480 Paroxysmal atrial fibrillation: Secondary | ICD-10-CM | POA: Diagnosis not present

## 2020-11-09 DIAGNOSIS — E1142 Type 2 diabetes mellitus with diabetic polyneuropathy: Secondary | ICD-10-CM | POA: Diagnosis not present

## 2020-11-09 DIAGNOSIS — I2510 Atherosclerotic heart disease of native coronary artery without angina pectoris: Secondary | ICD-10-CM | POA: Diagnosis not present

## 2020-11-09 DIAGNOSIS — G43909 Migraine, unspecified, not intractable, without status migrainosus: Secondary | ICD-10-CM | POA: Diagnosis not present

## 2020-11-09 DIAGNOSIS — M150 Primary generalized (osteo)arthritis: Secondary | ICD-10-CM | POA: Diagnosis not present

## 2020-11-09 DIAGNOSIS — I509 Heart failure, unspecified: Secondary | ICD-10-CM | POA: Diagnosis not present

## 2020-11-09 DIAGNOSIS — I872 Venous insufficiency (chronic) (peripheral): Secondary | ICD-10-CM | POA: Diagnosis not present

## 2020-11-09 DIAGNOSIS — E039 Hypothyroidism, unspecified: Secondary | ICD-10-CM | POA: Diagnosis not present

## 2020-11-09 DIAGNOSIS — G8929 Other chronic pain: Secondary | ICD-10-CM | POA: Diagnosis not present

## 2020-11-09 DIAGNOSIS — E1151 Type 2 diabetes mellitus with diabetic peripheral angiopathy without gangrene: Secondary | ICD-10-CM | POA: Diagnosis not present

## 2020-11-09 DIAGNOSIS — E11319 Type 2 diabetes mellitus with unspecified diabetic retinopathy without macular edema: Secondary | ICD-10-CM | POA: Diagnosis not present

## 2020-11-09 DIAGNOSIS — K5730 Diverticulosis of large intestine without perforation or abscess without bleeding: Secondary | ICD-10-CM | POA: Diagnosis not present

## 2020-11-09 DIAGNOSIS — I252 Old myocardial infarction: Secondary | ICD-10-CM | POA: Diagnosis not present

## 2020-11-09 DIAGNOSIS — G4733 Obstructive sleep apnea (adult) (pediatric): Secondary | ICD-10-CM | POA: Diagnosis not present

## 2020-11-09 DIAGNOSIS — K219 Gastro-esophageal reflux disease without esophagitis: Secondary | ICD-10-CM | POA: Diagnosis not present

## 2020-11-09 DIAGNOSIS — H409 Unspecified glaucoma: Secondary | ICD-10-CM | POA: Diagnosis not present

## 2020-11-09 DIAGNOSIS — I110 Hypertensive heart disease with heart failure: Secondary | ICD-10-CM | POA: Diagnosis not present

## 2020-11-09 DIAGNOSIS — H9190 Unspecified hearing loss, unspecified ear: Secondary | ICD-10-CM | POA: Diagnosis not present

## 2020-11-09 DIAGNOSIS — J45909 Unspecified asthma, uncomplicated: Secondary | ICD-10-CM | POA: Diagnosis not present

## 2020-11-16 DIAGNOSIS — I252 Old myocardial infarction: Secondary | ICD-10-CM | POA: Diagnosis not present

## 2020-11-16 DIAGNOSIS — I480 Paroxysmal atrial fibrillation: Secondary | ICD-10-CM | POA: Diagnosis not present

## 2020-11-16 DIAGNOSIS — I872 Venous insufficiency (chronic) (peripheral): Secondary | ICD-10-CM | POA: Diagnosis not present

## 2020-11-16 DIAGNOSIS — D509 Iron deficiency anemia, unspecified: Secondary | ICD-10-CM | POA: Diagnosis not present

## 2020-11-16 DIAGNOSIS — I110 Hypertensive heart disease with heart failure: Secondary | ICD-10-CM | POA: Diagnosis not present

## 2020-11-16 DIAGNOSIS — M150 Primary generalized (osteo)arthritis: Secondary | ICD-10-CM | POA: Diagnosis not present

## 2020-11-16 DIAGNOSIS — H409 Unspecified glaucoma: Secondary | ICD-10-CM | POA: Diagnosis not present

## 2020-11-16 DIAGNOSIS — K219 Gastro-esophageal reflux disease without esophagitis: Secondary | ICD-10-CM | POA: Diagnosis not present

## 2020-11-16 DIAGNOSIS — K5730 Diverticulosis of large intestine without perforation or abscess without bleeding: Secondary | ICD-10-CM | POA: Diagnosis not present

## 2020-11-16 DIAGNOSIS — I2510 Atherosclerotic heart disease of native coronary artery without angina pectoris: Secondary | ICD-10-CM | POA: Diagnosis not present

## 2020-11-16 DIAGNOSIS — G43909 Migraine, unspecified, not intractable, without status migrainosus: Secondary | ICD-10-CM | POA: Diagnosis not present

## 2020-11-16 DIAGNOSIS — E1142 Type 2 diabetes mellitus with diabetic polyneuropathy: Secondary | ICD-10-CM | POA: Diagnosis not present

## 2020-11-16 DIAGNOSIS — G4700 Insomnia, unspecified: Secondary | ICD-10-CM | POA: Diagnosis not present

## 2020-11-16 DIAGNOSIS — E1151 Type 2 diabetes mellitus with diabetic peripheral angiopathy without gangrene: Secondary | ICD-10-CM | POA: Diagnosis not present

## 2020-11-16 DIAGNOSIS — E11319 Type 2 diabetes mellitus with unspecified diabetic retinopathy without macular edema: Secondary | ICD-10-CM | POA: Diagnosis not present

## 2020-11-16 DIAGNOSIS — I509 Heart failure, unspecified: Secondary | ICD-10-CM | POA: Diagnosis not present

## 2020-11-16 DIAGNOSIS — G8929 Other chronic pain: Secondary | ICD-10-CM | POA: Diagnosis not present

## 2020-11-16 DIAGNOSIS — J45909 Unspecified asthma, uncomplicated: Secondary | ICD-10-CM | POA: Diagnosis not present

## 2020-11-16 DIAGNOSIS — G4733 Obstructive sleep apnea (adult) (pediatric): Secondary | ICD-10-CM | POA: Diagnosis not present

## 2020-11-16 DIAGNOSIS — H9190 Unspecified hearing loss, unspecified ear: Secondary | ICD-10-CM | POA: Diagnosis not present

## 2020-11-16 DIAGNOSIS — M064 Inflammatory polyarthropathy: Secondary | ICD-10-CM | POA: Diagnosis not present

## 2020-11-16 DIAGNOSIS — E039 Hypothyroidism, unspecified: Secondary | ICD-10-CM | POA: Diagnosis not present

## 2020-11-24 DIAGNOSIS — E039 Hypothyroidism, unspecified: Secondary | ICD-10-CM | POA: Diagnosis not present

## 2020-11-25 DIAGNOSIS — Z1231 Encounter for screening mammogram for malignant neoplasm of breast: Secondary | ICD-10-CM | POA: Diagnosis not present

## 2020-11-26 ENCOUNTER — Other Ambulatory Visit: Payer: Self-pay

## 2020-11-26 ENCOUNTER — Inpatient Hospital Stay: Payer: Medicare Other | Attending: Hematology

## 2020-11-26 VITALS — BP 151/50 | HR 80 | Temp 98.2°F | Resp 18 | Wt 193.2 lb

## 2020-11-26 DIAGNOSIS — Z79899 Other long term (current) drug therapy: Secondary | ICD-10-CM | POA: Insufficient documentation

## 2020-11-26 DIAGNOSIS — D649 Anemia, unspecified: Secondary | ICD-10-CM | POA: Diagnosis not present

## 2020-11-26 DIAGNOSIS — D509 Iron deficiency anemia, unspecified: Secondary | ICD-10-CM

## 2020-11-26 LAB — GLUCOSE, CAPILLARY: Glucose-Capillary: 176 mg/dL — ABNORMAL HIGH (ref 70–99)

## 2020-11-26 MED ORDER — LORATADINE 10 MG PO TABS
10.0000 mg | ORAL_TABLET | Freq: Once | ORAL | Status: AC
  Administered 2020-11-26: 10 mg via ORAL

## 2020-11-26 MED ORDER — ACETAMINOPHEN 325 MG PO TABS
650.0000 mg | ORAL_TABLET | Freq: Once | ORAL | Status: AC
Start: 2020-11-26 — End: 2020-11-26
  Administered 2020-11-26: 650 mg via ORAL

## 2020-11-26 MED ORDER — ACETAMINOPHEN 325 MG PO TABS
ORAL_TABLET | ORAL | Status: AC
  Filled 2020-11-26: qty 2

## 2020-11-26 MED ORDER — SODIUM CHLORIDE 0.9 % IV SOLN
750.0000 mg | Freq: Once | INTRAVENOUS | Status: AC
  Administered 2020-11-26: 750 mg via INTRAVENOUS
  Filled 2020-11-26: qty 15

## 2020-11-26 MED ORDER — SODIUM CHLORIDE 0.9 % IV SOLN
Freq: Once | INTRAVENOUS | Status: AC
  Filled 2020-11-26: qty 250

## 2020-11-26 MED ORDER — LORATADINE 10 MG PO TABS
ORAL_TABLET | ORAL | Status: AC
  Filled 2020-11-26: qty 1

## 2020-11-26 NOTE — Patient Instructions (Signed)
Ferric carboxymaltose injection What is this medicine? FERRIC CARBOXYMALTOSE (ferr-ik car-box-ee-mol-toes) is an iron complex. Iron is used to make healthy red blood cells, which carry oxygen and nutrients throughout the body. This medicine is used to treat anemia in people with chronic kidney disease or people who cannot take iron by mouth. This medicine may be used for other purposes; ask your health care provider or pharmacist if you have questions. COMMON BRAND NAME(S): Injectafer What should I tell my health care provider before I take this medicine? They need to know if you have any of these conditions:  high levels of iron in the blood  liver disease  an unusual or allergic reaction to iron, other medicines, foods, dyes, or preservatives  pregnant or trying to get pregnant  breast-feeding How should I use this medicine? This medicine is for infusion into a vein. It is given by a health care professional in a hospital or clinic setting. Talk to your pediatrician regarding the use of this medicine in children. Special care may be needed. Overdosage: If you think you have taken too much of this medicine contact a poison control center or emergency room at once. NOTE: This medicine is only for you. Do not share this medicine with others. What if I miss a dose? Keep appointments for follow-up doses. It is important not to miss your dose. Call your care team if you are unable to keep an appointment. What may interact with this medicine? Do not take this medicine with any of the following medications:  deferoxamine  dimercaprol  other iron products This list may not describe all possible interactions. Give your health care provider a list of all the medicines, herbs, non-prescription drugs, or dietary supplements you use. Also tell them if you smoke, drink alcohol, or use illegal drugs. Some items may interact with your medicine. What should I watch for while using this  medicine? Visit your doctor or health care professional regularly. Tell your doctor if your symptoms do not start to get better or if they get worse. You may need blood work done while you are taking this medicine. You may need to follow a special diet. Talk to your doctor. Foods that contain iron include: whole grains/cereals, dried fruits, beans, or peas, leafy green vegetables, and organ meats (liver, kidney). What side effects may I notice from receiving this medicine? Side effects that you should report to your doctor or health care professional as soon as possible:  allergic reactions like skin rash, itching or hives, swelling of the face, lips, or tongue  dizziness  facial flushing Side effects that usually do not require medical attention (report to your doctor or health care professional if they continue or are bothersome):  changes in taste  constipation  headache  nausea, vomiting  pain, redness, or irritation at site where injected This list may not describe all possible side effects. Call your doctor for medical advice about side effects. You may report side effects to FDA at 1-800-FDA-1088. Where should I keep my medicine? This drug is given in a hospital or clinic and will not be stored at home. NOTE: This sheet is a summary. It may not cover all possible information. If you have questions about this medicine, talk to your doctor, pharmacist, or health care provider.  2021 Elsevier/Gold Standard (2020-07-28 14:00:47) Iron Deficiency Anemia, Adult Iron deficiency anemia is when you do not have enough red blood cells or hemoglobin in your blood. This happens because you have too little  iron in your body. Hemoglobin carries oxygen to parts of the body. Anemia can cause your body to not get enough oxygen. What are the causes?  Not eating enough foods that have iron in them.  The body not being able to take in iron well.  Needing more iron due to pregnancy or heavy  menstrual periods, for females.  Cancer.  Bleeding in the bowels.  Many blood draws. What increases the risk?  Being pregnant.  Being a teenage girl going through a growth spurt. What are the signs or symptoms?  Pale skin, lips, and nails.  Weakness, dizziness, and getting tired easily.  Headache.  Feeling like you cannot breathe well when moving (shortness of breath).  Cold hands and feet.  Fast heartbeat or a heartbeat that is not regular.  Feeling grouchy (irritable) or breathing fast. These are more common in very bad anemia. Mild anemia may not cause any symptoms. How is this treated? This condition is treated by finding out why you do not have enough iron and then getting more iron. It may include:  Adding foods to your diet that have a lot of iron.  Taking iron pills (supplements). If you are pregnant or breastfeeding, you may need to take extra iron. Your diet often does not provide the amount of iron that you need.  Getting more vitamin C in your diet. Vitamin C helps your body take in iron. You may need to take iron pills with a glass of orange juice or vitamin C pills.  Medicines to make heavy menstrual periods lighter.  Surgery. You may need blood tests to see if treatment is working. If the treatment does not seem to be working, you may need more tests. Follow these instructions at home: Medicines  Take over-the-counter and prescription medicines only as told by your doctor. This includes iron pills and vitamins. ? Take iron pills when your stomach is empty. If you cannot handle this, take them with food. ? Do not drink milk or take antacids at the same time as your iron pills. ? Iron pills may turn your poop (stool)black.  If you cannot handle taking iron pills by mouth, ask your doctor about getting iron through: ? An IV tube. ? A shot (injection) into a muscle. Eating and drinking  Talk with your doctor before changing the foods you eat. He or she  may tell you to eat foods that have a lot of iron, such as: ? Liver. ? Low-fat (lean) beef. ? Breads and cereals that have iron added to them. ? Eggs. ? Dried fruit. ? Dark green, leafy vegetables.  Eat fresh fruits and vegetables that are high in vitamin C. They help your body use iron. Foods with a lot of vitamin C include: ? Oranges. ? Peppers. ? Tomatoes. ? Mangoes.  Drink enough fluid to keep your pee (urine) pale yellow.   Managing constipation If you are taking iron pills, they may cause trouble pooping (constipation). To prevent or treat trouble pooping, you may need to:  Take over-the-counter or prescription medicines.  Eat foods that are high in fiber. These include beans, whole grains, and fresh fruits and vegetables.  Limit foods that are high in fat and sugar. These include fried or sweet foods. General instructions  Return to your normal activities as told by your doctor. Ask your doctor what activities are safe for you.  Keep yourself clean, and keep things clean around you.  Keep all follow-up visits as told by your  doctor. This is important. Contact a doctor if:  You feel like you may vomit (nauseous), or you vomit.  You feel weak.  You are sweating for no reason.  You have trouble pooping, such as: ? Pooping less than 3 times a week. ? Straining to poop. ? Having poop that is hard, dry, or larger than normal. ? Feeling full or bloated. ? Pain in the lower belly. ? Not feeling better after pooping. Get help right away if:  You pass out (faint).  You have chest pain.  You have trouble breathing that: ? Is very bad. ? Gets worse with physical activity.  You have a fast heartbeat, or a heartbeat that does not feel regular.  You get light-headed when getting up from sitting or lying down. These symptoms may be an emergency. Do not wait to see if the symptoms will go away. Get medical help right away. Call your local emergency services (911 in the  [place] Do not drive yourself to the hospital. Summary  Iron deficiency anemia is when you have too little iron in your body.  This condition is treated by finding out why you do not have enough iron in your body and then getting more iron.  Take over-the-counter and prescription medicines only as told by your doctor.  Eat fresh fruits and vegetables that are high in vitamin C.  Get help right away if you cannot breathe well. This information is not intended to replace advice given to you by your health care provider. Make sure you discuss any questions you have with your health care provider. Document Revised: 04/23/2019 Document Reviewed: 04/23/2019 Elsevier Patient Education  [place].

## 2020-11-26 NOTE — Progress Notes (Signed)
Pt presented today for IV iron (injectafer) infusion. Pt tolerated treatment well, VS remained stable through whole infusion. Pt stayed for 30 min post infusion observation, and was stable and ambulatory to lobby upon discharge. All questions answered, pt verbalized understand to call with any concerns.

## 2020-11-27 DIAGNOSIS — J45909 Unspecified asthma, uncomplicated: Secondary | ICD-10-CM | POA: Diagnosis not present

## 2020-11-27 DIAGNOSIS — I872 Venous insufficiency (chronic) (peripheral): Secondary | ICD-10-CM | POA: Diagnosis not present

## 2020-11-27 DIAGNOSIS — M150 Primary generalized (osteo)arthritis: Secondary | ICD-10-CM | POA: Diagnosis not present

## 2020-11-27 DIAGNOSIS — I480 Paroxysmal atrial fibrillation: Secondary | ICD-10-CM | POA: Diagnosis not present

## 2020-11-27 DIAGNOSIS — G8929 Other chronic pain: Secondary | ICD-10-CM | POA: Diagnosis not present

## 2020-11-27 DIAGNOSIS — G4700 Insomnia, unspecified: Secondary | ICD-10-CM | POA: Diagnosis not present

## 2020-11-27 DIAGNOSIS — H409 Unspecified glaucoma: Secondary | ICD-10-CM | POA: Diagnosis not present

## 2020-11-27 DIAGNOSIS — H9190 Unspecified hearing loss, unspecified ear: Secondary | ICD-10-CM | POA: Diagnosis not present

## 2020-11-27 DIAGNOSIS — I2510 Atherosclerotic heart disease of native coronary artery without angina pectoris: Secondary | ICD-10-CM | POA: Diagnosis not present

## 2020-11-27 DIAGNOSIS — E039 Hypothyroidism, unspecified: Secondary | ICD-10-CM | POA: Diagnosis not present

## 2020-11-27 DIAGNOSIS — I110 Hypertensive heart disease with heart failure: Secondary | ICD-10-CM | POA: Diagnosis not present

## 2020-11-27 DIAGNOSIS — E1142 Type 2 diabetes mellitus with diabetic polyneuropathy: Secondary | ICD-10-CM | POA: Diagnosis not present

## 2020-11-27 DIAGNOSIS — K219 Gastro-esophageal reflux disease without esophagitis: Secondary | ICD-10-CM | POA: Diagnosis not present

## 2020-11-27 DIAGNOSIS — M064 Inflammatory polyarthropathy: Secondary | ICD-10-CM | POA: Diagnosis not present

## 2020-11-27 DIAGNOSIS — E11319 Type 2 diabetes mellitus with unspecified diabetic retinopathy without macular edema: Secondary | ICD-10-CM | POA: Diagnosis not present

## 2020-11-27 DIAGNOSIS — E1151 Type 2 diabetes mellitus with diabetic peripheral angiopathy without gangrene: Secondary | ICD-10-CM | POA: Diagnosis not present

## 2020-11-27 DIAGNOSIS — G4733 Obstructive sleep apnea (adult) (pediatric): Secondary | ICD-10-CM | POA: Diagnosis not present

## 2020-11-27 DIAGNOSIS — I252 Old myocardial infarction: Secondary | ICD-10-CM | POA: Diagnosis not present

## 2020-11-27 DIAGNOSIS — G43909 Migraine, unspecified, not intractable, without status migrainosus: Secondary | ICD-10-CM | POA: Diagnosis not present

## 2020-11-27 DIAGNOSIS — K5730 Diverticulosis of large intestine without perforation or abscess without bleeding: Secondary | ICD-10-CM | POA: Diagnosis not present

## 2020-11-27 DIAGNOSIS — D509 Iron deficiency anemia, unspecified: Secondary | ICD-10-CM | POA: Diagnosis not present

## 2020-11-27 DIAGNOSIS — I509 Heart failure, unspecified: Secondary | ICD-10-CM | POA: Diagnosis not present

## 2020-11-30 DIAGNOSIS — I252 Old myocardial infarction: Secondary | ICD-10-CM | POA: Diagnosis not present

## 2020-11-30 DIAGNOSIS — K219 Gastro-esophageal reflux disease without esophagitis: Secondary | ICD-10-CM | POA: Diagnosis not present

## 2020-11-30 DIAGNOSIS — I2510 Atherosclerotic heart disease of native coronary artery without angina pectoris: Secondary | ICD-10-CM | POA: Diagnosis not present

## 2020-11-30 DIAGNOSIS — E1142 Type 2 diabetes mellitus with diabetic polyneuropathy: Secondary | ICD-10-CM | POA: Diagnosis not present

## 2020-11-30 DIAGNOSIS — I509 Heart failure, unspecified: Secondary | ICD-10-CM | POA: Diagnosis not present

## 2020-11-30 DIAGNOSIS — E1151 Type 2 diabetes mellitus with diabetic peripheral angiopathy without gangrene: Secondary | ICD-10-CM | POA: Diagnosis not present

## 2020-11-30 DIAGNOSIS — I872 Venous insufficiency (chronic) (peripheral): Secondary | ICD-10-CM | POA: Diagnosis not present

## 2020-11-30 DIAGNOSIS — I110 Hypertensive heart disease with heart failure: Secondary | ICD-10-CM | POA: Diagnosis not present

## 2020-11-30 DIAGNOSIS — M150 Primary generalized (osteo)arthritis: Secondary | ICD-10-CM | POA: Diagnosis not present

## 2020-11-30 DIAGNOSIS — D509 Iron deficiency anemia, unspecified: Secondary | ICD-10-CM | POA: Diagnosis not present

## 2020-11-30 DIAGNOSIS — G8929 Other chronic pain: Secondary | ICD-10-CM | POA: Diagnosis not present

## 2020-11-30 DIAGNOSIS — I480 Paroxysmal atrial fibrillation: Secondary | ICD-10-CM | POA: Diagnosis not present

## 2020-11-30 DIAGNOSIS — G43909 Migraine, unspecified, not intractable, without status migrainosus: Secondary | ICD-10-CM | POA: Diagnosis not present

## 2020-11-30 DIAGNOSIS — E039 Hypothyroidism, unspecified: Secondary | ICD-10-CM | POA: Diagnosis not present

## 2020-11-30 DIAGNOSIS — J45909 Unspecified asthma, uncomplicated: Secondary | ICD-10-CM | POA: Diagnosis not present

## 2020-11-30 DIAGNOSIS — E11319 Type 2 diabetes mellitus with unspecified diabetic retinopathy without macular edema: Secondary | ICD-10-CM | POA: Diagnosis not present

## 2020-11-30 DIAGNOSIS — K5730 Diverticulosis of large intestine without perforation or abscess without bleeding: Secondary | ICD-10-CM | POA: Diagnosis not present

## 2020-11-30 DIAGNOSIS — H9190 Unspecified hearing loss, unspecified ear: Secondary | ICD-10-CM | POA: Diagnosis not present

## 2020-11-30 DIAGNOSIS — G4733 Obstructive sleep apnea (adult) (pediatric): Secondary | ICD-10-CM | POA: Diagnosis not present

## 2020-11-30 DIAGNOSIS — M064 Inflammatory polyarthropathy: Secondary | ICD-10-CM | POA: Diagnosis not present

## 2020-11-30 DIAGNOSIS — H409 Unspecified glaucoma: Secondary | ICD-10-CM | POA: Diagnosis not present

## 2020-11-30 DIAGNOSIS — G4700 Insomnia, unspecified: Secondary | ICD-10-CM | POA: Diagnosis not present

## 2020-12-04 DIAGNOSIS — G8929 Other chronic pain: Secondary | ICD-10-CM | POA: Diagnosis not present

## 2020-12-04 DIAGNOSIS — E1151 Type 2 diabetes mellitus with diabetic peripheral angiopathy without gangrene: Secondary | ICD-10-CM | POA: Diagnosis not present

## 2020-12-04 DIAGNOSIS — K219 Gastro-esophageal reflux disease without esophagitis: Secondary | ICD-10-CM | POA: Diagnosis not present

## 2020-12-04 DIAGNOSIS — I252 Old myocardial infarction: Secondary | ICD-10-CM | POA: Diagnosis not present

## 2020-12-04 DIAGNOSIS — I110 Hypertensive heart disease with heart failure: Secondary | ICD-10-CM | POA: Diagnosis not present

## 2020-12-04 DIAGNOSIS — E11319 Type 2 diabetes mellitus with unspecified diabetic retinopathy without macular edema: Secondary | ICD-10-CM | POA: Diagnosis not present

## 2020-12-04 DIAGNOSIS — G4700 Insomnia, unspecified: Secondary | ICD-10-CM | POA: Diagnosis not present

## 2020-12-04 DIAGNOSIS — I2510 Atherosclerotic heart disease of native coronary artery without angina pectoris: Secondary | ICD-10-CM | POA: Diagnosis not present

## 2020-12-04 DIAGNOSIS — H9190 Unspecified hearing loss, unspecified ear: Secondary | ICD-10-CM | POA: Diagnosis not present

## 2020-12-04 DIAGNOSIS — I872 Venous insufficiency (chronic) (peripheral): Secondary | ICD-10-CM | POA: Diagnosis not present

## 2020-12-04 DIAGNOSIS — I480 Paroxysmal atrial fibrillation: Secondary | ICD-10-CM | POA: Diagnosis not present

## 2020-12-04 DIAGNOSIS — E039 Hypothyroidism, unspecified: Secondary | ICD-10-CM | POA: Diagnosis not present

## 2020-12-04 DIAGNOSIS — G43909 Migraine, unspecified, not intractable, without status migrainosus: Secondary | ICD-10-CM | POA: Diagnosis not present

## 2020-12-04 DIAGNOSIS — H409 Unspecified glaucoma: Secondary | ICD-10-CM | POA: Diagnosis not present

## 2020-12-04 DIAGNOSIS — G4733 Obstructive sleep apnea (adult) (pediatric): Secondary | ICD-10-CM | POA: Diagnosis not present

## 2020-12-04 DIAGNOSIS — K5730 Diverticulosis of large intestine without perforation or abscess without bleeding: Secondary | ICD-10-CM | POA: Diagnosis not present

## 2020-12-04 DIAGNOSIS — M150 Primary generalized (osteo)arthritis: Secondary | ICD-10-CM | POA: Diagnosis not present

## 2020-12-04 DIAGNOSIS — J45909 Unspecified asthma, uncomplicated: Secondary | ICD-10-CM | POA: Diagnosis not present

## 2020-12-04 DIAGNOSIS — M064 Inflammatory polyarthropathy: Secondary | ICD-10-CM | POA: Diagnosis not present

## 2020-12-04 DIAGNOSIS — E1142 Type 2 diabetes mellitus with diabetic polyneuropathy: Secondary | ICD-10-CM | POA: Diagnosis not present

## 2020-12-04 DIAGNOSIS — I509 Heart failure, unspecified: Secondary | ICD-10-CM | POA: Diagnosis not present

## 2020-12-04 DIAGNOSIS — D509 Iron deficiency anemia, unspecified: Secondary | ICD-10-CM | POA: Diagnosis not present

## 2020-12-07 DIAGNOSIS — I2510 Atherosclerotic heart disease of native coronary artery without angina pectoris: Secondary | ICD-10-CM | POA: Diagnosis not present

## 2020-12-07 DIAGNOSIS — H9190 Unspecified hearing loss, unspecified ear: Secondary | ICD-10-CM | POA: Diagnosis not present

## 2020-12-07 DIAGNOSIS — I872 Venous insufficiency (chronic) (peripheral): Secondary | ICD-10-CM | POA: Diagnosis not present

## 2020-12-07 DIAGNOSIS — I509 Heart failure, unspecified: Secondary | ICD-10-CM | POA: Diagnosis not present

## 2020-12-07 DIAGNOSIS — M064 Inflammatory polyarthropathy: Secondary | ICD-10-CM | POA: Diagnosis not present

## 2020-12-07 DIAGNOSIS — E11319 Type 2 diabetes mellitus with unspecified diabetic retinopathy without macular edema: Secondary | ICD-10-CM | POA: Diagnosis not present

## 2020-12-07 DIAGNOSIS — G4733 Obstructive sleep apnea (adult) (pediatric): Secondary | ICD-10-CM | POA: Diagnosis not present

## 2020-12-07 DIAGNOSIS — J45909 Unspecified asthma, uncomplicated: Secondary | ICD-10-CM | POA: Diagnosis not present

## 2020-12-07 DIAGNOSIS — E1151 Type 2 diabetes mellitus with diabetic peripheral angiopathy without gangrene: Secondary | ICD-10-CM | POA: Diagnosis not present

## 2020-12-07 DIAGNOSIS — E039 Hypothyroidism, unspecified: Secondary | ICD-10-CM | POA: Diagnosis not present

## 2020-12-07 DIAGNOSIS — E1142 Type 2 diabetes mellitus with diabetic polyneuropathy: Secondary | ICD-10-CM | POA: Diagnosis not present

## 2020-12-07 DIAGNOSIS — K5730 Diverticulosis of large intestine without perforation or abscess without bleeding: Secondary | ICD-10-CM | POA: Diagnosis not present

## 2020-12-07 DIAGNOSIS — M150 Primary generalized (osteo)arthritis: Secondary | ICD-10-CM | POA: Diagnosis not present

## 2020-12-07 DIAGNOSIS — G4700 Insomnia, unspecified: Secondary | ICD-10-CM | POA: Diagnosis not present

## 2020-12-07 DIAGNOSIS — I110 Hypertensive heart disease with heart failure: Secondary | ICD-10-CM | POA: Diagnosis not present

## 2020-12-07 DIAGNOSIS — G43909 Migraine, unspecified, not intractable, without status migrainosus: Secondary | ICD-10-CM | POA: Diagnosis not present

## 2020-12-07 DIAGNOSIS — I252 Old myocardial infarction: Secondary | ICD-10-CM | POA: Diagnosis not present

## 2020-12-07 DIAGNOSIS — G8929 Other chronic pain: Secondary | ICD-10-CM | POA: Diagnosis not present

## 2020-12-07 DIAGNOSIS — H409 Unspecified glaucoma: Secondary | ICD-10-CM | POA: Diagnosis not present

## 2020-12-07 DIAGNOSIS — I480 Paroxysmal atrial fibrillation: Secondary | ICD-10-CM | POA: Diagnosis not present

## 2020-12-07 DIAGNOSIS — D509 Iron deficiency anemia, unspecified: Secondary | ICD-10-CM | POA: Diagnosis not present

## 2020-12-07 DIAGNOSIS — K219 Gastro-esophageal reflux disease without esophagitis: Secondary | ICD-10-CM | POA: Diagnosis not present

## 2020-12-09 DIAGNOSIS — M064 Inflammatory polyarthropathy: Secondary | ICD-10-CM | POA: Diagnosis not present

## 2020-12-09 DIAGNOSIS — H409 Unspecified glaucoma: Secondary | ICD-10-CM | POA: Diagnosis not present

## 2020-12-09 DIAGNOSIS — I2510 Atherosclerotic heart disease of native coronary artery without angina pectoris: Secondary | ICD-10-CM | POA: Diagnosis not present

## 2020-12-09 DIAGNOSIS — G8929 Other chronic pain: Secondary | ICD-10-CM | POA: Diagnosis not present

## 2020-12-09 DIAGNOSIS — G43909 Migraine, unspecified, not intractable, without status migrainosus: Secondary | ICD-10-CM | POA: Diagnosis not present

## 2020-12-09 DIAGNOSIS — I872 Venous insufficiency (chronic) (peripheral): Secondary | ICD-10-CM | POA: Diagnosis not present

## 2020-12-09 DIAGNOSIS — D509 Iron deficiency anemia, unspecified: Secondary | ICD-10-CM | POA: Diagnosis not present

## 2020-12-09 DIAGNOSIS — K5730 Diverticulosis of large intestine without perforation or abscess without bleeding: Secondary | ICD-10-CM | POA: Diagnosis not present

## 2020-12-09 DIAGNOSIS — E11319 Type 2 diabetes mellitus with unspecified diabetic retinopathy without macular edema: Secondary | ICD-10-CM | POA: Diagnosis not present

## 2020-12-09 DIAGNOSIS — I509 Heart failure, unspecified: Secondary | ICD-10-CM | POA: Diagnosis not present

## 2020-12-09 DIAGNOSIS — E039 Hypothyroidism, unspecified: Secondary | ICD-10-CM | POA: Diagnosis not present

## 2020-12-09 DIAGNOSIS — I252 Old myocardial infarction: Secondary | ICD-10-CM | POA: Diagnosis not present

## 2020-12-09 DIAGNOSIS — H9190 Unspecified hearing loss, unspecified ear: Secondary | ICD-10-CM | POA: Diagnosis not present

## 2020-12-09 DIAGNOSIS — I110 Hypertensive heart disease with heart failure: Secondary | ICD-10-CM | POA: Diagnosis not present

## 2020-12-09 DIAGNOSIS — G4733 Obstructive sleep apnea (adult) (pediatric): Secondary | ICD-10-CM | POA: Diagnosis not present

## 2020-12-09 DIAGNOSIS — E1151 Type 2 diabetes mellitus with diabetic peripheral angiopathy without gangrene: Secondary | ICD-10-CM | POA: Diagnosis not present

## 2020-12-09 DIAGNOSIS — I480 Paroxysmal atrial fibrillation: Secondary | ICD-10-CM | POA: Diagnosis not present

## 2020-12-09 DIAGNOSIS — E1142 Type 2 diabetes mellitus with diabetic polyneuropathy: Secondary | ICD-10-CM | POA: Diagnosis not present

## 2020-12-09 DIAGNOSIS — G4700 Insomnia, unspecified: Secondary | ICD-10-CM | POA: Diagnosis not present

## 2020-12-09 DIAGNOSIS — K219 Gastro-esophageal reflux disease without esophagitis: Secondary | ICD-10-CM | POA: Diagnosis not present

## 2020-12-09 DIAGNOSIS — M150 Primary generalized (osteo)arthritis: Secondary | ICD-10-CM | POA: Diagnosis not present

## 2020-12-09 DIAGNOSIS — J45909 Unspecified asthma, uncomplicated: Secondary | ICD-10-CM | POA: Diagnosis not present

## 2020-12-14 DIAGNOSIS — K219 Gastro-esophageal reflux disease without esophagitis: Secondary | ICD-10-CM | POA: Diagnosis not present

## 2020-12-14 DIAGNOSIS — G43909 Migraine, unspecified, not intractable, without status migrainosus: Secondary | ICD-10-CM | POA: Diagnosis not present

## 2020-12-14 DIAGNOSIS — I252 Old myocardial infarction: Secondary | ICD-10-CM | POA: Diagnosis not present

## 2020-12-14 DIAGNOSIS — H409 Unspecified glaucoma: Secondary | ICD-10-CM | POA: Diagnosis not present

## 2020-12-14 DIAGNOSIS — I2510 Atherosclerotic heart disease of native coronary artery without angina pectoris: Secondary | ICD-10-CM | POA: Diagnosis not present

## 2020-12-14 DIAGNOSIS — J45909 Unspecified asthma, uncomplicated: Secondary | ICD-10-CM | POA: Diagnosis not present

## 2020-12-14 DIAGNOSIS — K5730 Diverticulosis of large intestine without perforation or abscess without bleeding: Secondary | ICD-10-CM | POA: Diagnosis not present

## 2020-12-14 DIAGNOSIS — G4733 Obstructive sleep apnea (adult) (pediatric): Secondary | ICD-10-CM | POA: Diagnosis not present

## 2020-12-14 DIAGNOSIS — M064 Inflammatory polyarthropathy: Secondary | ICD-10-CM | POA: Diagnosis not present

## 2020-12-14 DIAGNOSIS — I509 Heart failure, unspecified: Secondary | ICD-10-CM | POA: Diagnosis not present

## 2020-12-14 DIAGNOSIS — M150 Primary generalized (osteo)arthritis: Secondary | ICD-10-CM | POA: Diagnosis not present

## 2020-12-14 DIAGNOSIS — I480 Paroxysmal atrial fibrillation: Secondary | ICD-10-CM | POA: Diagnosis not present

## 2020-12-14 DIAGNOSIS — G4700 Insomnia, unspecified: Secondary | ICD-10-CM | POA: Diagnosis not present

## 2020-12-14 DIAGNOSIS — H9190 Unspecified hearing loss, unspecified ear: Secondary | ICD-10-CM | POA: Diagnosis not present

## 2020-12-14 DIAGNOSIS — E039 Hypothyroidism, unspecified: Secondary | ICD-10-CM | POA: Diagnosis not present

## 2020-12-14 DIAGNOSIS — G8929 Other chronic pain: Secondary | ICD-10-CM | POA: Diagnosis not present

## 2020-12-14 DIAGNOSIS — D509 Iron deficiency anemia, unspecified: Secondary | ICD-10-CM | POA: Diagnosis not present

## 2020-12-14 DIAGNOSIS — E1142 Type 2 diabetes mellitus with diabetic polyneuropathy: Secondary | ICD-10-CM | POA: Diagnosis not present

## 2020-12-14 DIAGNOSIS — I110 Hypertensive heart disease with heart failure: Secondary | ICD-10-CM | POA: Diagnosis not present

## 2020-12-14 DIAGNOSIS — E1151 Type 2 diabetes mellitus with diabetic peripheral angiopathy without gangrene: Secondary | ICD-10-CM | POA: Diagnosis not present

## 2020-12-14 DIAGNOSIS — I872 Venous insufficiency (chronic) (peripheral): Secondary | ICD-10-CM | POA: Diagnosis not present

## 2020-12-14 DIAGNOSIS — E11319 Type 2 diabetes mellitus with unspecified diabetic retinopathy without macular edema: Secondary | ICD-10-CM | POA: Diagnosis not present

## 2020-12-16 DIAGNOSIS — I509 Heart failure, unspecified: Secondary | ICD-10-CM | POA: Diagnosis not present

## 2020-12-16 DIAGNOSIS — I2510 Atherosclerotic heart disease of native coronary artery without angina pectoris: Secondary | ICD-10-CM | POA: Diagnosis not present

## 2020-12-16 DIAGNOSIS — D509 Iron deficiency anemia, unspecified: Secondary | ICD-10-CM | POA: Diagnosis not present

## 2020-12-16 DIAGNOSIS — K5730 Diverticulosis of large intestine without perforation or abscess without bleeding: Secondary | ICD-10-CM | POA: Diagnosis not present

## 2020-12-16 DIAGNOSIS — H9190 Unspecified hearing loss, unspecified ear: Secondary | ICD-10-CM | POA: Diagnosis not present

## 2020-12-16 DIAGNOSIS — M150 Primary generalized (osteo)arthritis: Secondary | ICD-10-CM | POA: Diagnosis not present

## 2020-12-16 DIAGNOSIS — G8929 Other chronic pain: Secondary | ICD-10-CM | POA: Diagnosis not present

## 2020-12-16 DIAGNOSIS — E039 Hypothyroidism, unspecified: Secondary | ICD-10-CM | POA: Diagnosis not present

## 2020-12-16 DIAGNOSIS — H409 Unspecified glaucoma: Secondary | ICD-10-CM | POA: Diagnosis not present

## 2020-12-16 DIAGNOSIS — G4733 Obstructive sleep apnea (adult) (pediatric): Secondary | ICD-10-CM | POA: Diagnosis not present

## 2020-12-16 DIAGNOSIS — G4700 Insomnia, unspecified: Secondary | ICD-10-CM | POA: Diagnosis not present

## 2020-12-16 DIAGNOSIS — G43909 Migraine, unspecified, not intractable, without status migrainosus: Secondary | ICD-10-CM | POA: Diagnosis not present

## 2020-12-16 DIAGNOSIS — E1142 Type 2 diabetes mellitus with diabetic polyneuropathy: Secondary | ICD-10-CM | POA: Diagnosis not present

## 2020-12-16 DIAGNOSIS — I480 Paroxysmal atrial fibrillation: Secondary | ICD-10-CM | POA: Diagnosis not present

## 2020-12-16 DIAGNOSIS — M064 Inflammatory polyarthropathy: Secondary | ICD-10-CM | POA: Diagnosis not present

## 2020-12-16 DIAGNOSIS — E11319 Type 2 diabetes mellitus with unspecified diabetic retinopathy without macular edema: Secondary | ICD-10-CM | POA: Diagnosis not present

## 2020-12-16 DIAGNOSIS — I872 Venous insufficiency (chronic) (peripheral): Secondary | ICD-10-CM | POA: Diagnosis not present

## 2020-12-16 DIAGNOSIS — I252 Old myocardial infarction: Secondary | ICD-10-CM | POA: Diagnosis not present

## 2020-12-16 DIAGNOSIS — E1151 Type 2 diabetes mellitus with diabetic peripheral angiopathy without gangrene: Secondary | ICD-10-CM | POA: Diagnosis not present

## 2020-12-16 DIAGNOSIS — I110 Hypertensive heart disease with heart failure: Secondary | ICD-10-CM | POA: Diagnosis not present

## 2020-12-16 DIAGNOSIS — K219 Gastro-esophageal reflux disease without esophagitis: Secondary | ICD-10-CM | POA: Diagnosis not present

## 2020-12-16 DIAGNOSIS — J45909 Unspecified asthma, uncomplicated: Secondary | ICD-10-CM | POA: Diagnosis not present

## 2020-12-22 DIAGNOSIS — J45909 Unspecified asthma, uncomplicated: Secondary | ICD-10-CM | POA: Diagnosis not present

## 2020-12-22 DIAGNOSIS — I480 Paroxysmal atrial fibrillation: Secondary | ICD-10-CM | POA: Diagnosis not present

## 2020-12-22 DIAGNOSIS — G4700 Insomnia, unspecified: Secondary | ICD-10-CM | POA: Diagnosis not present

## 2020-12-22 DIAGNOSIS — G4733 Obstructive sleep apnea (adult) (pediatric): Secondary | ICD-10-CM | POA: Diagnosis not present

## 2020-12-22 DIAGNOSIS — M064 Inflammatory polyarthropathy: Secondary | ICD-10-CM | POA: Diagnosis not present

## 2020-12-22 DIAGNOSIS — I252 Old myocardial infarction: Secondary | ICD-10-CM | POA: Diagnosis not present

## 2020-12-22 DIAGNOSIS — I110 Hypertensive heart disease with heart failure: Secondary | ICD-10-CM | POA: Diagnosis not present

## 2020-12-22 DIAGNOSIS — K219 Gastro-esophageal reflux disease without esophagitis: Secondary | ICD-10-CM | POA: Diagnosis not present

## 2020-12-22 DIAGNOSIS — K5730 Diverticulosis of large intestine without perforation or abscess without bleeding: Secondary | ICD-10-CM | POA: Diagnosis not present

## 2020-12-22 DIAGNOSIS — H409 Unspecified glaucoma: Secondary | ICD-10-CM | POA: Diagnosis not present

## 2020-12-22 DIAGNOSIS — I872 Venous insufficiency (chronic) (peripheral): Secondary | ICD-10-CM | POA: Diagnosis not present

## 2020-12-22 DIAGNOSIS — M150 Primary generalized (osteo)arthritis: Secondary | ICD-10-CM | POA: Diagnosis not present

## 2020-12-22 DIAGNOSIS — I2510 Atherosclerotic heart disease of native coronary artery without angina pectoris: Secondary | ICD-10-CM | POA: Diagnosis not present

## 2020-12-22 DIAGNOSIS — I509 Heart failure, unspecified: Secondary | ICD-10-CM | POA: Diagnosis not present

## 2020-12-22 DIAGNOSIS — D509 Iron deficiency anemia, unspecified: Secondary | ICD-10-CM | POA: Diagnosis not present

## 2020-12-22 DIAGNOSIS — E11319 Type 2 diabetes mellitus with unspecified diabetic retinopathy without macular edema: Secondary | ICD-10-CM | POA: Diagnosis not present

## 2020-12-22 DIAGNOSIS — E1142 Type 2 diabetes mellitus with diabetic polyneuropathy: Secondary | ICD-10-CM | POA: Diagnosis not present

## 2020-12-22 DIAGNOSIS — H9190 Unspecified hearing loss, unspecified ear: Secondary | ICD-10-CM | POA: Diagnosis not present

## 2020-12-22 DIAGNOSIS — G8929 Other chronic pain: Secondary | ICD-10-CM | POA: Diagnosis not present

## 2020-12-22 DIAGNOSIS — G43909 Migraine, unspecified, not intractable, without status migrainosus: Secondary | ICD-10-CM | POA: Diagnosis not present

## 2020-12-22 DIAGNOSIS — E039 Hypothyroidism, unspecified: Secondary | ICD-10-CM | POA: Diagnosis not present

## 2020-12-22 DIAGNOSIS — E1151 Type 2 diabetes mellitus with diabetic peripheral angiopathy without gangrene: Secondary | ICD-10-CM | POA: Diagnosis not present

## 2020-12-24 DIAGNOSIS — I2510 Atherosclerotic heart disease of native coronary artery without angina pectoris: Secondary | ICD-10-CM | POA: Diagnosis not present

## 2020-12-24 DIAGNOSIS — G8929 Other chronic pain: Secondary | ICD-10-CM | POA: Diagnosis not present

## 2020-12-24 DIAGNOSIS — H9190 Unspecified hearing loss, unspecified ear: Secondary | ICD-10-CM | POA: Diagnosis not present

## 2020-12-24 DIAGNOSIS — M150 Primary generalized (osteo)arthritis: Secondary | ICD-10-CM | POA: Diagnosis not present

## 2020-12-24 DIAGNOSIS — I110 Hypertensive heart disease with heart failure: Secondary | ICD-10-CM | POA: Diagnosis not present

## 2020-12-24 DIAGNOSIS — K5730 Diverticulosis of large intestine without perforation or abscess without bleeding: Secondary | ICD-10-CM | POA: Diagnosis not present

## 2020-12-24 DIAGNOSIS — G4700 Insomnia, unspecified: Secondary | ICD-10-CM | POA: Diagnosis not present

## 2020-12-24 DIAGNOSIS — E1142 Type 2 diabetes mellitus with diabetic polyneuropathy: Secondary | ICD-10-CM | POA: Diagnosis not present

## 2020-12-24 DIAGNOSIS — I252 Old myocardial infarction: Secondary | ICD-10-CM | POA: Diagnosis not present

## 2020-12-24 DIAGNOSIS — G43909 Migraine, unspecified, not intractable, without status migrainosus: Secondary | ICD-10-CM | POA: Diagnosis not present

## 2020-12-24 DIAGNOSIS — K219 Gastro-esophageal reflux disease without esophagitis: Secondary | ICD-10-CM | POA: Diagnosis not present

## 2020-12-24 DIAGNOSIS — G4733 Obstructive sleep apnea (adult) (pediatric): Secondary | ICD-10-CM | POA: Diagnosis not present

## 2020-12-24 DIAGNOSIS — I509 Heart failure, unspecified: Secondary | ICD-10-CM | POA: Diagnosis not present

## 2020-12-24 DIAGNOSIS — J45909 Unspecified asthma, uncomplicated: Secondary | ICD-10-CM | POA: Diagnosis not present

## 2020-12-24 DIAGNOSIS — E11319 Type 2 diabetes mellitus with unspecified diabetic retinopathy without macular edema: Secondary | ICD-10-CM | POA: Diagnosis not present

## 2020-12-24 DIAGNOSIS — E1151 Type 2 diabetes mellitus with diabetic peripheral angiopathy without gangrene: Secondary | ICD-10-CM | POA: Diagnosis not present

## 2020-12-24 DIAGNOSIS — H409 Unspecified glaucoma: Secondary | ICD-10-CM | POA: Diagnosis not present

## 2020-12-24 DIAGNOSIS — M064 Inflammatory polyarthropathy: Secondary | ICD-10-CM | POA: Diagnosis not present

## 2020-12-24 DIAGNOSIS — D509 Iron deficiency anemia, unspecified: Secondary | ICD-10-CM | POA: Diagnosis not present

## 2020-12-24 DIAGNOSIS — I480 Paroxysmal atrial fibrillation: Secondary | ICD-10-CM | POA: Diagnosis not present

## 2020-12-24 DIAGNOSIS — I872 Venous insufficiency (chronic) (peripheral): Secondary | ICD-10-CM | POA: Diagnosis not present

## 2020-12-24 DIAGNOSIS — E039 Hypothyroidism, unspecified: Secondary | ICD-10-CM | POA: Diagnosis not present

## 2020-12-28 DIAGNOSIS — E1151 Type 2 diabetes mellitus with diabetic peripheral angiopathy without gangrene: Secondary | ICD-10-CM | POA: Diagnosis not present

## 2020-12-28 DIAGNOSIS — G8929 Other chronic pain: Secondary | ICD-10-CM | POA: Diagnosis not present

## 2020-12-28 DIAGNOSIS — E039 Hypothyroidism, unspecified: Secondary | ICD-10-CM | POA: Diagnosis not present

## 2020-12-28 DIAGNOSIS — I509 Heart failure, unspecified: Secondary | ICD-10-CM | POA: Diagnosis not present

## 2020-12-28 DIAGNOSIS — K219 Gastro-esophageal reflux disease without esophagitis: Secondary | ICD-10-CM | POA: Diagnosis not present

## 2020-12-28 DIAGNOSIS — G43909 Migraine, unspecified, not intractable, without status migrainosus: Secondary | ICD-10-CM | POA: Diagnosis not present

## 2020-12-28 DIAGNOSIS — M150 Primary generalized (osteo)arthritis: Secondary | ICD-10-CM | POA: Diagnosis not present

## 2020-12-28 DIAGNOSIS — D509 Iron deficiency anemia, unspecified: Secondary | ICD-10-CM | POA: Diagnosis not present

## 2020-12-28 DIAGNOSIS — H409 Unspecified glaucoma: Secondary | ICD-10-CM | POA: Diagnosis not present

## 2020-12-28 DIAGNOSIS — E11319 Type 2 diabetes mellitus with unspecified diabetic retinopathy without macular edema: Secondary | ICD-10-CM | POA: Diagnosis not present

## 2020-12-28 DIAGNOSIS — K5730 Diverticulosis of large intestine without perforation or abscess without bleeding: Secondary | ICD-10-CM | POA: Diagnosis not present

## 2020-12-28 DIAGNOSIS — E1142 Type 2 diabetes mellitus with diabetic polyneuropathy: Secondary | ICD-10-CM | POA: Diagnosis not present

## 2020-12-28 DIAGNOSIS — G4733 Obstructive sleep apnea (adult) (pediatric): Secondary | ICD-10-CM | POA: Diagnosis not present

## 2020-12-28 DIAGNOSIS — I872 Venous insufficiency (chronic) (peripheral): Secondary | ICD-10-CM | POA: Diagnosis not present

## 2020-12-28 DIAGNOSIS — I480 Paroxysmal atrial fibrillation: Secondary | ICD-10-CM | POA: Diagnosis not present

## 2020-12-28 DIAGNOSIS — H9190 Unspecified hearing loss, unspecified ear: Secondary | ICD-10-CM | POA: Diagnosis not present

## 2020-12-28 DIAGNOSIS — J45909 Unspecified asthma, uncomplicated: Secondary | ICD-10-CM | POA: Diagnosis not present

## 2020-12-28 DIAGNOSIS — G4700 Insomnia, unspecified: Secondary | ICD-10-CM | POA: Diagnosis not present

## 2020-12-28 DIAGNOSIS — I252 Old myocardial infarction: Secondary | ICD-10-CM | POA: Diagnosis not present

## 2020-12-28 DIAGNOSIS — M064 Inflammatory polyarthropathy: Secondary | ICD-10-CM | POA: Diagnosis not present

## 2020-12-28 DIAGNOSIS — I110 Hypertensive heart disease with heart failure: Secondary | ICD-10-CM | POA: Diagnosis not present

## 2020-12-28 DIAGNOSIS — I2510 Atherosclerotic heart disease of native coronary artery without angina pectoris: Secondary | ICD-10-CM | POA: Diagnosis not present

## 2020-12-31 DIAGNOSIS — I480 Paroxysmal atrial fibrillation: Secondary | ICD-10-CM | POA: Diagnosis not present

## 2020-12-31 DIAGNOSIS — M064 Inflammatory polyarthropathy: Secondary | ICD-10-CM | POA: Diagnosis not present

## 2020-12-31 DIAGNOSIS — E1151 Type 2 diabetes mellitus with diabetic peripheral angiopathy without gangrene: Secondary | ICD-10-CM | POA: Diagnosis not present

## 2020-12-31 DIAGNOSIS — I110 Hypertensive heart disease with heart failure: Secondary | ICD-10-CM | POA: Diagnosis not present

## 2020-12-31 DIAGNOSIS — G8929 Other chronic pain: Secondary | ICD-10-CM | POA: Diagnosis not present

## 2020-12-31 DIAGNOSIS — J45909 Unspecified asthma, uncomplicated: Secondary | ICD-10-CM | POA: Diagnosis not present

## 2020-12-31 DIAGNOSIS — E1142 Type 2 diabetes mellitus with diabetic polyneuropathy: Secondary | ICD-10-CM | POA: Diagnosis not present

## 2020-12-31 DIAGNOSIS — I252 Old myocardial infarction: Secondary | ICD-10-CM | POA: Diagnosis not present

## 2020-12-31 DIAGNOSIS — K219 Gastro-esophageal reflux disease without esophagitis: Secondary | ICD-10-CM | POA: Diagnosis not present

## 2020-12-31 DIAGNOSIS — G4733 Obstructive sleep apnea (adult) (pediatric): Secondary | ICD-10-CM | POA: Diagnosis not present

## 2020-12-31 DIAGNOSIS — I872 Venous insufficiency (chronic) (peripheral): Secondary | ICD-10-CM | POA: Diagnosis not present

## 2020-12-31 DIAGNOSIS — I2510 Atherosclerotic heart disease of native coronary artery without angina pectoris: Secondary | ICD-10-CM | POA: Diagnosis not present

## 2020-12-31 DIAGNOSIS — G43909 Migraine, unspecified, not intractable, without status migrainosus: Secondary | ICD-10-CM | POA: Diagnosis not present

## 2020-12-31 DIAGNOSIS — I509 Heart failure, unspecified: Secondary | ICD-10-CM | POA: Diagnosis not present

## 2020-12-31 DIAGNOSIS — E11319 Type 2 diabetes mellitus with unspecified diabetic retinopathy without macular edema: Secondary | ICD-10-CM | POA: Diagnosis not present

## 2020-12-31 DIAGNOSIS — G4700 Insomnia, unspecified: Secondary | ICD-10-CM | POA: Diagnosis not present

## 2020-12-31 DIAGNOSIS — M150 Primary generalized (osteo)arthritis: Secondary | ICD-10-CM | POA: Diagnosis not present

## 2020-12-31 DIAGNOSIS — H9190 Unspecified hearing loss, unspecified ear: Secondary | ICD-10-CM | POA: Diagnosis not present

## 2020-12-31 DIAGNOSIS — H409 Unspecified glaucoma: Secondary | ICD-10-CM | POA: Diagnosis not present

## 2020-12-31 DIAGNOSIS — K5730 Diverticulosis of large intestine without perforation or abscess without bleeding: Secondary | ICD-10-CM | POA: Diagnosis not present

## 2020-12-31 DIAGNOSIS — D509 Iron deficiency anemia, unspecified: Secondary | ICD-10-CM | POA: Diagnosis not present

## 2020-12-31 DIAGNOSIS — E039 Hypothyroidism, unspecified: Secondary | ICD-10-CM | POA: Diagnosis not present

## 2021-01-01 ENCOUNTER — Ambulatory Visit (INDEPENDENT_AMBULATORY_CARE_PROVIDER_SITE_OTHER): Payer: Medicare Other | Admitting: Internal Medicine

## 2021-01-01 ENCOUNTER — Other Ambulatory Visit: Payer: Self-pay

## 2021-01-01 ENCOUNTER — Encounter: Payer: Self-pay | Admitting: Internal Medicine

## 2021-01-01 ENCOUNTER — Telehealth: Payer: Self-pay | Admitting: Internal Medicine

## 2021-01-01 VITALS — BP 150/82 | HR 88 | Ht 62.0 in | Wt 202.2 lb

## 2021-01-01 DIAGNOSIS — E039 Hypothyroidism, unspecified: Secondary | ICD-10-CM

## 2021-01-01 DIAGNOSIS — M810 Age-related osteoporosis without current pathological fracture: Secondary | ICD-10-CM

## 2021-01-01 LAB — COMPREHENSIVE METABOLIC PANEL
ALT: 18 U/L (ref 0–35)
AST: 17 U/L (ref 0–37)
Albumin: 4.3 g/dL (ref 3.5–5.2)
Alkaline Phosphatase: 132 U/L — ABNORMAL HIGH (ref 39–117)
BUN: 29 mg/dL — ABNORMAL HIGH (ref 6–23)
CO2: 26 mEq/L (ref 19–32)
Calcium: 8.9 mg/dL (ref 8.4–10.5)
Chloride: 103 mEq/L (ref 96–112)
Creatinine, Ser: 0.99 mg/dL (ref 0.40–1.20)
GFR: 55.9 mL/min — ABNORMAL LOW (ref >60.00)
Glucose, Bld: 127 mg/dL — ABNORMAL HIGH (ref 70–99)
Potassium: 4 mEq/L (ref 3.5–5.1)
Sodium: 141 mEq/L (ref 135–145)
Total Bilirubin: 0.3 mg/dL (ref 0.2–1.2)
Total Protein: 7.3 g/dL (ref 6.0–8.3)

## 2021-01-01 LAB — VITAMIN D 25 HYDROXY (VIT D DEFICIENCY, FRACTURES): VITD: 67.71 ng/mL (ref 30.00–100.00)

## 2021-01-01 LAB — TSH: TSH: 0.58 u[IU]/mL (ref 0.35–4.50)

## 2021-01-01 NOTE — Telephone Encounter (Signed)
Hi Brandy,    Can you please add this pt to the prolia list ?   Thanks   Abby Nena Jordan, MD  [facility]  [facility] [address] [place], [place] [address] Phone: [phone] FAX: [phone]

## 2021-01-01 NOTE — Progress Notes (Signed)
Name: Megan [name]  MRN/ DOB: [id number], 1946/06/15    Age/ Sex: 75 y.o., female    PCP: [name], MD   Reason for Endocrinology Evaluation: Clinical Osteoporosis      Date of Initial Endocrinology Evaluation: 01/01/2021     HPI: [name] is a 75 y.o. female with a past medical history of T2DM, CAD, A. Fib . The patient presented for initial endocrinology clinic visit on 01/01/2021 for consultative assistance with her Osteoporosis    Pt was diagnosed with osteoporosis:09/2020  Menarche at age : 30 Menopausal at age : S/P hysterectomy 1992 Fracture Hx: left arm fracture 11/2019. S/P fall at home  Hx of HRT: no FH of osteoporosis or hip fracture: no Prior Hx of anti-estrogenic therapy : no  Prior Hx of anti-resorptive therapy : no   She is on Tum 500 mg 1 tablet daily and ergocalciferol 50,000 iu weekly    Has been on LT-4 replacement since 2019 . Takes it appropriately  Of note , she was noted to have low TSh at 0.33 uIU/mL during labs 08/2020    HISTORY:  Past Medical History:  Past Medical History:  Diagnosis Date  . Allergy    bee stings  . Anxiety   . Asthmatic bronchitis   . Broken arm 1957/2008   right  . Cataract    had surgery   . Depression   . Diabetes mellitus without complication ([place])    prediabetes diet controlled  . Diverticulosis   . GERD (gastroesophageal reflux disease)   . Glaucoma    BIL  . Hemorrhoids   . Hx of adenomatous colonic polyps 12/02/2010  . Hyperlipidemia   . Hypertension   . Hypothyroidism 05/2018  . Inflammatory arthritis   . Knee bursitis   . Migraines   . Obesity   . Osteoarthritis   . Sleep apnea    Past Surgical History:  Past Surgical History:  Procedure Laterality Date  . CARPAL TUNNEL RELEASE Left    left wrist  . CATARACT EXTRACTION W/ INTRAOCULAR LENS  IMPLANT, BILATERAL Bilateral    1999 left, 2008 right  . COLONOSCOPY    . CORONARY ARTERY BYPASS GRAFT N/A 05/15/2019   Procedure:  CORONARY ARTERY BYPASS GRAFTING (CABG) times two, left internal mammary artery to left anterior descending artery and left greater saphenous vein to posterior descending artery, left sapheouns vein harvested endoscopically ;  Surgeon: Grace Isaac, MD;  Location: [place] [facility];  Service: Open Heart Surgery;  Laterality: N/A;  . EXCISION MORTON'S NEUROMA Left 1978   left foot  . GLAUCOMA SURGERY Bilateral    and laser surgery, 2004 left, 2008 right  . LEFT HEART CATH AND CORONARY ANGIOGRAPHY N/A 05/13/2019   Procedure: LEFT HEART CATH AND CORONARY ANGIOGRAPHY;  Surgeon: Martinique, Peter M, MD;  Location: [facility];  Service: Cardiovascular;  Laterality: N/A;  . ORIF RADIAL FRACTURE Left 12/13/2019   Procedure: OPEN REDUCTION INTERNAL FIXATION (ORIF) FOREARM FRACTURE;  Surgeon: Iran Planas, MD;  Location: [place];  Service: Orthopedics;  Laterality: Left;  . PARTIAL HYSTERECTOMY  1991   Left an ovary  . POLYPECTOMY    . TEE WITHOUT CARDIOVERSION N/A 05/15/2019   Procedure: TRANSESOPHAGEAL ECHOCARDIOGRAM (TEE);  Surgeon: Grace Isaac, MD;  Location: [facility];  Service: Open Heart Surgery;  Laterality: N/A;  . TOENAIL AVULSION Right    big toe    Social History:  reports that she quit smoking about 49 years  ago. Her smoking use included cigarettes. She has never used smokeless tobacco. She reports that she does not drink alcohol and does not use drugs. Family History: family history includes Asthma in her brother and sister; Bone cancer in her maternal grandfather; Breast cancer in her brother; COPD in her sister; Clotting disorder in her sister; Colon polyps in her maternal grandfather; Diabetes in her brother, father, mother, and sister; Heart Problems in her father; Hypertension in her father and mother; Obesity in an other family member; Parkinson's disease in her mother.   HOME MEDICATIONS: Allergies as of 01/01/2021      Reactions   Aspirin Anaphylaxis   Bee Pollen Swelling, Other (See  Comments)   Extreme swelling due to bee stings   Codeine Nausea And Vomiting   Fish Oil Diarrhea   Sulfa Antibiotics Nausea And Vomiting   Hornet Venom Swelling, Other (See Comments)   Foot became swollen 3X it's normal size   Iron Other (See Comments)   Severe Headache   Lorazepam Other (See Comments)   Pantoprazole Diarrhea   Tape Other (See Comments)   "Tape leaves burn-like places and blisters"   Tramadol Hcl Other (See Comments)   Camphor Dermatitis   Lisinopril Cough   Penicillins Rash, Other (See Comments)   Swelling was of the    Sulfasalazine Nausea And Vomiting   Vicodin [hydrocodone-acetaminophen] Nausea And Vomiting      Medication List       Accurate as of Jan 01, 2021  7:26 AM. If you have any questions, ask your nurse or doctor.        acetaminophen 650 MG CR tablet Commonly known as: TYLENOL Take 650 mg by mouth every 8 (eight) hours.   allopurinol 100 MG tablet Commonly known as: ZYLOPRIM Take 1 tablet (100 mg total) by mouth daily.   atorvastatin 80 MG tablet Commonly known as: LIPITOR Take 1 tablet (80 mg total) by mouth daily at 6 PM.   bisacodyl 10 MG suppository Commonly known as: DULCOLAX bisacodyl 10 mg rectal suppository  IF NOT RELIEVED BY MOM, GIVE 10 MG BISACODYL SUPPOSITIORY RECTALLY X 1 DOSE IN 24 HOURS AS NEEDED   calcium carbonate 500 MG chewable tablet Commonly known as: TUMS - dosed in mg elemental calcium Chew 1-2 tablets by mouth as needed for indigestion or heartburn.   clopidogrel 75 MG tablet Commonly known as: PLAVIX Take 1 tablet (75 mg total) by mouth daily.   CVS B6 100 MG tablet Generic drug: pyridoxine Take 1 tablet by mouth at bedtime.   diclofenac sodium 1 % Gel Commonly known as: VOLTAREN Apply 2 gm topically to left knee   EPINEPHrine 0.3 mg/0.3 mL Soaj injection Commonly known as: EPI-PEN Inject 0.3 mg into the muscle once as needed for anaphylaxis.   famotidine 20 MG tablet Commonly known as:  PEPCID Take 20 mg by mouth 2 (two) times daily.   furosemide 40 MG tablet Commonly known as: LASIX Take 1 tablet (40 mg total) by mouth daily.   gabapentin 100 MG capsule Commonly known as: NEURONTIN Take 100 mg by mouth every 8 (eight) hours as needed (for pain).   levothyroxine 75 MCG tablet Commonly known as: SYNTHROID Take 75 mcg by mouth daily before breakfast.   Lidocaine 4 % Ptch Commonly known as: Aspercreme Lidocaine Place 1 patch onto the skin daily. APPLY TOPICALLY TO LOWER BACK ONCE DAILY FOR PAIN   loperamide 2 MG tablet Commonly known as: IMODIUM A-D 1 tablet as needed  LORazepam 0.5 MG tablet Commonly known as: ATIVAN Take 0.5 mg by mouth every 8 (eight) hours.   losartan 25 MG tablet Commonly known as: COZAAR Take 0.5 tablets (12.5 mg total) by mouth daily.   Magnesium Oxide 400 MG Caps Take 1 capsule (400 mg total) by mouth daily.   meclizine 25 MG tablet Commonly known as: ANTIVERT Take 25 mg by mouth 3 (three) times daily as needed for dizziness.   melatonin 3 MG Tabs tablet Take 1 tablet (3 mg total) by mouth at bedtime. FOR INSOMNIA   metFORMIN 500 MG tablet Commonly known as: GLUCOPHAGE Take 1 tablet (500 mg total) by mouth 2 (two) times daily with a meal.   metoprolol succinate 25 MG 24 hr tablet Commonly known as: Toprol XL Take 1 tablet (25 mg total) by mouth daily.   metoprolol tartrate 25 MG tablet Commonly known as: LOPRESSOR metoprolol tartrate 25 mg tablet  TAKE 1/2 TABLET BY MOUTH 2 TIMES DAILY IF SBP  110   multivitamins with iron Tabs tablet Take 1 tablet by mouth daily.   ondansetron 4 MG disintegrating tablet Commonly known as: ZOFRAN-ODT 1 tablet on the tongue and allow to dissolve   ondansetron 4 MG tablet Commonly known as: ZOFRAN Take 1 tablet (4 mg total) by mouth every 6 (six) hours as needed for nausea or vomiting. GIVE 1 TABLET BY MOUTH EVERY 6 HOURS AS NEEDED FOR NAUSEA/VOMITING   OneTouch Verio test  strip Generic drug: glucose blood USE TO TEST BLOOD GLUCOSE ONCE DAILY   pantoprazole 20 MG tablet Commonly known as: PROTONIX Take 20 mg by mouth daily.   polyethylene glycol 17 g packet Commonly known as: MIRALAX / GLYCOLAX Take 17 g by mouth daily.   polyvinyl alcohol 1.4 % ophthalmic solution Commonly known as: LIQUIFILM TEARS Place 1 drop into both eyes every 4 (four) hours as needed for dry eyes.   potassium chloride SA 20 MEQ tablet Commonly known as: KLOR-CON Take 1 tablet by mouth once a week. On Wednesday   predniSONE 10 MG tablet Commonly known as: DELTASONE Take by mouth.   spironolactone 25 MG tablet Commonly known as: ALDACTONE Take 12.5 mg by mouth at bedtime.   tiZANidine 2 MG tablet Commonly known as: ZANAFLEX Take 1-2 tablets by mouth 3 (three) times daily as needed. Muscle relaxant   torsemide 20 MG tablet Commonly known as: DEMADEX torsemide 20 mg tablet  TAKE 1 TABLET BY MOUTH EVERY DAY   traMADol 50 MG tablet Commonly known as: ULTRAM Take 50 mg by mouth 4 (four) times daily as needed.   vitamin B-12 1000 MCG tablet Commonly known as: CYANOCOBALAMIN 1 tab by orally daily   Vitamin D (Ergocalciferol) 1.25 MG (50000 UNIT) Caps capsule Commonly known as: DRISDOL Take 50,000 Units by mouth every Thursday.         REVIEW OF SYSTEMS: A comprehensive ROS was conducted with the patient and is negative except as per HPI and below:  ROS     OBJECTIVE:  VS: There were no vitals taken for this visit.   Wt Readings from Last 3 Encounters:  11/26/20 193 lb 3.2 oz (87.6 kg)  09/25/20 187 lb 4.8 oz (85 kg)  07/14/20 178 lb 12.8 oz (81.1 kg)     EXAM: General: Pt appears well and is in NAD  Eyes: External eye exam normal without stare, lid lag or exophthalmos.  EOM intact.    Neck: General: Supple without adenopathy. Thyroid: Thyroid size normal.  Lungs:  Clear with good BS bilat with no rales, rhonchi, or wheezes  Heart: Auscultation:  RRR, + systolic murmur   Abdomen: Normoactive bowel sounds, soft, nontender, without masses or organomegaly palpable  Extremities: . BL LE: trace pretibial edema   Skin: Hair: Texture and amount normal with gender appropriate distribution Skin Inspection: No rashes, acanthosis nigricans/skin tags. No lipohypertrophy Skin Palpation: Skin temperature, texture, and thickness normal to palpation  Mental Status: Judgment, insight: Intact Orientation: Oriented to time, place, and person Mood and affect: No depression, anxiety, or agitation     DATA REVIEWED: Results for NETHA, DAFOE (MRN [id number]) as of 01/01/2021 15:39  Ref. Range 01/01/2021 10:34  Sodium Latest Ref Range: 135 - 145 mEq/L 141  Potassium Latest Ref Range: 3.5 - 5.1 mEq/L 4.0  Chloride Latest Ref Range: 96 - 112 mEq/L 103  CO2 Latest Ref Range: 19 - 32 mEq/L 26  Glucose Latest Ref Range: 70 - 99 mg/dL 127 (H)  BUN Latest Ref Range: 6 - 23 mg/dL 29 (H)  Creatinine Latest Ref Range: 0.40 - 1.20 mg/dL 0.99  Calcium Latest Ref Range: 8.4 - 10.5 mg/dL 8.9  Alkaline Phosphatase Latest Ref Range: 39 - 117 U/L 132 (H)  Albumin Latest Ref Range: 3.5 - 5.2 g/dL 4.3  AST Latest Ref Range: 0 - 37 U/L 17  ALT Latest Ref Range: 0 - 35 U/L 18  Total Protein Latest Ref Range: 6.0 - 8.3 g/dL 7.3  Total Bilirubin Latest Ref Range: 0.2 - 1.2 mg/dL 0.3  GFR Latest Ref Range: >60.00 mL/min 55.90 (L)  VITD Latest Ref Range: 30.00 - 100.00 ng/mL 67.71  TSH Latest Ref Range: 0.35 - 4.50 uIU/mL 0.58    DXA 10/24/2020  AP LUMBAR SPINE L1 and L4  Bone Mineral Density (BMD):  1.011 g/cm2  Young Adult T-Score:  -0.2  Z-Score:  0.2  Left FEMUR neck  Bone Mineral Density (BMD):  0.629 g/cm2  Young Adult T-Score: -2.0  Z-Score:  0.1  Unit: This study was performed at [facility] [place] (S/N ([phone]), software version 13.4.2.  Scan quality: The scan quality is good. Exclusions: L2 and L3 due  to degenerative changes.  ASSESSMENT: Patient's diagnostic category is LOW BONE MASS by WHO Criteria.  ASSESSMENT/PLAN/RECOMMENDATIONS:   Osteoporosis :  -Despite DA scan results consistent with low bone density, the patient meets criteria for clinical osteoporosis given fragility fracture of the left arm. -She was advised to increase calcium to 1200 mg daily -She is interested in Prolia based on PCP recommendations, we discussed side effects such as local reaction, hypocalcemia, flulike symptoms, as well as the rare risk of osteonecrosis and atypical fractures.  Medications : Increase calcium to 1200 mg daily Continue ergocalciferol 50,000 IUs weekly Start Prolia 60 mg SQ every 6 months  2.  Hypothyroid:  -She had a low TSH at 0.33U IU/mL during labs in 08/2020. -Repeat labs today show normal TSH.  Medications Continue levothyroxine 75 MCG daily  Follow-up in 6 months    Signed electronically by: Mack Guise, MD  [facility]  [facility] [address] [place], [place] [address] Phone: [phone] FAX: [phone]   CC: [name], MD [place] [address] [place] [address] Phone: [phone] Fax: [phone]   Return to Endocrinology clinic as below: Future Appointments  Date Time Provider [place]  01/01/2021 10:30 AM Koula Venier, Melanie Crazier, MD [facility] None  01/22/2021 10:15 AM Gardiner Barefoot, DPM [facility]  01/26/2021  9:15 AM Richardson Dopp T, PA-C [facility]  05/28/2021 11:00 AM [facility] None  05/28/2021 11:40 AM Brunetta Genera, MD [facility] None

## 2021-01-01 NOTE — Patient Instructions (Signed)
-   PLease increase calcium to 1200 mg daily  - Continue Ergocalciferol (vitamin D) 50,000 iu weekly

## 2021-01-04 DIAGNOSIS — D509 Iron deficiency anemia, unspecified: Secondary | ICD-10-CM | POA: Diagnosis not present

## 2021-01-04 DIAGNOSIS — G4700 Insomnia, unspecified: Secondary | ICD-10-CM | POA: Diagnosis not present

## 2021-01-04 DIAGNOSIS — G8929 Other chronic pain: Secondary | ICD-10-CM | POA: Diagnosis not present

## 2021-01-04 DIAGNOSIS — K219 Gastro-esophageal reflux disease without esophagitis: Secondary | ICD-10-CM | POA: Diagnosis not present

## 2021-01-04 DIAGNOSIS — E1151 Type 2 diabetes mellitus with diabetic peripheral angiopathy without gangrene: Secondary | ICD-10-CM | POA: Diagnosis not present

## 2021-01-04 DIAGNOSIS — J45909 Unspecified asthma, uncomplicated: Secondary | ICD-10-CM | POA: Diagnosis not present

## 2021-01-04 DIAGNOSIS — I509 Heart failure, unspecified: Secondary | ICD-10-CM | POA: Diagnosis not present

## 2021-01-04 DIAGNOSIS — K5730 Diverticulosis of large intestine without perforation or abscess without bleeding: Secondary | ICD-10-CM | POA: Diagnosis not present

## 2021-01-04 DIAGNOSIS — M064 Inflammatory polyarthropathy: Secondary | ICD-10-CM | POA: Diagnosis not present

## 2021-01-04 DIAGNOSIS — I872 Venous insufficiency (chronic) (peripheral): Secondary | ICD-10-CM | POA: Diagnosis not present

## 2021-01-04 DIAGNOSIS — E1142 Type 2 diabetes mellitus with diabetic polyneuropathy: Secondary | ICD-10-CM | POA: Diagnosis not present

## 2021-01-04 DIAGNOSIS — E039 Hypothyroidism, unspecified: Secondary | ICD-10-CM | POA: Diagnosis not present

## 2021-01-04 DIAGNOSIS — I480 Paroxysmal atrial fibrillation: Secondary | ICD-10-CM | POA: Diagnosis not present

## 2021-01-04 DIAGNOSIS — G4733 Obstructive sleep apnea (adult) (pediatric): Secondary | ICD-10-CM | POA: Diagnosis not present

## 2021-01-04 DIAGNOSIS — M150 Primary generalized (osteo)arthritis: Secondary | ICD-10-CM | POA: Diagnosis not present

## 2021-01-04 DIAGNOSIS — I2510 Atherosclerotic heart disease of native coronary artery without angina pectoris: Secondary | ICD-10-CM | POA: Diagnosis not present

## 2021-01-04 DIAGNOSIS — H9190 Unspecified hearing loss, unspecified ear: Secondary | ICD-10-CM | POA: Diagnosis not present

## 2021-01-04 DIAGNOSIS — H409 Unspecified glaucoma: Secondary | ICD-10-CM | POA: Diagnosis not present

## 2021-01-04 DIAGNOSIS — I252 Old myocardial infarction: Secondary | ICD-10-CM | POA: Diagnosis not present

## 2021-01-04 DIAGNOSIS — E11319 Type 2 diabetes mellitus with unspecified diabetic retinopathy without macular edema: Secondary | ICD-10-CM | POA: Diagnosis not present

## 2021-01-04 DIAGNOSIS — I110 Hypertensive heart disease with heart failure: Secondary | ICD-10-CM | POA: Diagnosis not present

## 2021-01-04 DIAGNOSIS — G43909 Migraine, unspecified, not intractable, without status migrainosus: Secondary | ICD-10-CM | POA: Diagnosis not present

## 2021-01-04 NOTE — Telephone Encounter (Signed)
Prolia VOB initiated via [email or web address] 

## 2021-01-11 DIAGNOSIS — I252 Old myocardial infarction: Secondary | ICD-10-CM | POA: Diagnosis not present

## 2021-01-11 DIAGNOSIS — H409 Unspecified glaucoma: Secondary | ICD-10-CM | POA: Diagnosis not present

## 2021-01-11 DIAGNOSIS — I2510 Atherosclerotic heart disease of native coronary artery without angina pectoris: Secondary | ICD-10-CM | POA: Diagnosis not present

## 2021-01-11 DIAGNOSIS — E1151 Type 2 diabetes mellitus with diabetic peripheral angiopathy without gangrene: Secondary | ICD-10-CM | POA: Diagnosis not present

## 2021-01-11 DIAGNOSIS — I480 Paroxysmal atrial fibrillation: Secondary | ICD-10-CM | POA: Diagnosis not present

## 2021-01-11 DIAGNOSIS — J45909 Unspecified asthma, uncomplicated: Secondary | ICD-10-CM | POA: Diagnosis not present

## 2021-01-11 DIAGNOSIS — I872 Venous insufficiency (chronic) (peripheral): Secondary | ICD-10-CM | POA: Diagnosis not present

## 2021-01-11 DIAGNOSIS — M150 Primary generalized (osteo)arthritis: Secondary | ICD-10-CM | POA: Diagnosis not present

## 2021-01-11 DIAGNOSIS — M064 Inflammatory polyarthropathy: Secondary | ICD-10-CM | POA: Diagnosis not present

## 2021-01-11 DIAGNOSIS — H9190 Unspecified hearing loss, unspecified ear: Secondary | ICD-10-CM | POA: Diagnosis not present

## 2021-01-11 DIAGNOSIS — G43909 Migraine, unspecified, not intractable, without status migrainosus: Secondary | ICD-10-CM | POA: Diagnosis not present

## 2021-01-11 DIAGNOSIS — E1142 Type 2 diabetes mellitus with diabetic polyneuropathy: Secondary | ICD-10-CM | POA: Diagnosis not present

## 2021-01-11 DIAGNOSIS — G4700 Insomnia, unspecified: Secondary | ICD-10-CM | POA: Diagnosis not present

## 2021-01-11 DIAGNOSIS — D509 Iron deficiency anemia, unspecified: Secondary | ICD-10-CM | POA: Diagnosis not present

## 2021-01-11 DIAGNOSIS — K5730 Diverticulosis of large intestine without perforation or abscess without bleeding: Secondary | ICD-10-CM | POA: Diagnosis not present

## 2021-01-11 DIAGNOSIS — G4733 Obstructive sleep apnea (adult) (pediatric): Secondary | ICD-10-CM | POA: Diagnosis not present

## 2021-01-11 DIAGNOSIS — E039 Hypothyroidism, unspecified: Secondary | ICD-10-CM | POA: Diagnosis not present

## 2021-01-11 DIAGNOSIS — I110 Hypertensive heart disease with heart failure: Secondary | ICD-10-CM | POA: Diagnosis not present

## 2021-01-11 DIAGNOSIS — G8929 Other chronic pain: Secondary | ICD-10-CM | POA: Diagnosis not present

## 2021-01-11 DIAGNOSIS — I509 Heart failure, unspecified: Secondary | ICD-10-CM | POA: Diagnosis not present

## 2021-01-11 DIAGNOSIS — K219 Gastro-esophageal reflux disease without esophagitis: Secondary | ICD-10-CM | POA: Diagnosis not present

## 2021-01-11 DIAGNOSIS — E11319 Type 2 diabetes mellitus with unspecified diabetic retinopathy without macular edema: Secondary | ICD-10-CM | POA: Diagnosis not present

## 2021-01-11 NOTE — Telephone Encounter (Signed)
MEDICAL BENEFIT SUMMARY Patient Out-of-Pocket Responsibility Coverage Available Authorization Required Deductible Co-pay/Coinsurance Prolia OOP COST PHYSICIAN FACILITY FEE ADMIN FEE PURCHASE OR REFERRAL: YES NO PRIMARY No SECONDARY No 0%* No* 0%* SPECIALTY PHARMACY (via Medical Benefit): YES NO 20%* No* $30* *Reflects patient costs once plan deductible is met. Please see Medical Benefit Details for further information regarding patient costs. Patient costs may vary based on services rendered. BENEFITS VERIFIED FOR THE FOLLOWING DIAGNOSIS AND INSURANCE PLANS Verified for Diagnosis M81.0 Site of Care  MD Office  Policy Status: Active  Policy Level: Primary Payer Name: UNITED HEALTHC[employer]-MEDICARE Plan Name: HMOPOS-AARP MEDICARE ADVANTAGE PLAN 1 (HMO-POS) Policy Number: [id number] Employer Name: Plan Type: Group Number: [id number]  Payer Phone:  Policy Status: Active   Policy Level: Secondary Payer Name: [employer] Plan Name: CH[employer]A Policy Number: [id number] Employer Name: Plan Type: Group Number:  Payer Phone: [phone] PRIMARY MEDICAL BENEFIT DETAILS (PHYSICIAN PURCHASE, OR REFERRAL TO TREATING SITE) COVERAGE AVAILABLE: Yes COVERAGE DETAILS: For the primary MD Purchase option, Prolia will be subject to a 20% coinsurance up to a $3600 out of pocket max ($387.39 met). Administration will be subject to a $30 copay. The benefits provided on this Verification of Benefits form are Medical Benefits and are the patient's In-Network benefits for Prolia. If you would like Pharmacy Benefits for Prolia, please call [phone]. AUTHORIZATION REQUIRED: No   SECONDARY MEDICAL BENEFIT DETAILS (PHYSICIAN PURCHASE, OR REFERRAL TO TREATING SITE) COVERAGE AVAILABLE: Yes COVERAGE DETAILS: The secondary plan will coordinate benefits and will consider the patient's remaining cost share at 100%. The benefits provided on this Verification of Benefits form are  Medical Benefits and are the patient's In-Network benefits for Prolia. If you would like Pharmacy Benefits for Prolia, please call [phone]. AUTHORIZATION REQUIRED: No 

## 2021-01-11 NOTE — Telephone Encounter (Signed)
Pt ready for scheduling  Out-of-pocket cost due at time of visit: $0.00   Primary: UHC Medicare Prolia co-insurance: 20% ($255) Admin fee co-insurance: 20% ($25)  Secondary: [employer] Prolia co-insurance: covers medicare co-insurance Admin fee co-insurance: covers medicare co-insurance  Deductible: does not apply  Prior Auth: n/a PA# Valid:

## 2021-01-19 DIAGNOSIS — G4733 Obstructive sleep apnea (adult) (pediatric): Secondary | ICD-10-CM | POA: Diagnosis not present

## 2021-01-19 DIAGNOSIS — G8929 Other chronic pain: Secondary | ICD-10-CM | POA: Diagnosis not present

## 2021-01-19 DIAGNOSIS — H409 Unspecified glaucoma: Secondary | ICD-10-CM | POA: Diagnosis not present

## 2021-01-19 DIAGNOSIS — E1151 Type 2 diabetes mellitus with diabetic peripheral angiopathy without gangrene: Secondary | ICD-10-CM | POA: Diagnosis not present

## 2021-01-19 DIAGNOSIS — M064 Inflammatory polyarthropathy: Secondary | ICD-10-CM | POA: Diagnosis not present

## 2021-01-19 DIAGNOSIS — M150 Primary generalized (osteo)arthritis: Secondary | ICD-10-CM | POA: Diagnosis not present

## 2021-01-19 DIAGNOSIS — I2510 Atherosclerotic heart disease of native coronary artery without angina pectoris: Secondary | ICD-10-CM | POA: Diagnosis not present

## 2021-01-19 DIAGNOSIS — E1142 Type 2 diabetes mellitus with diabetic polyneuropathy: Secondary | ICD-10-CM | POA: Diagnosis not present

## 2021-01-19 DIAGNOSIS — D509 Iron deficiency anemia, unspecified: Secondary | ICD-10-CM | POA: Diagnosis not present

## 2021-01-19 DIAGNOSIS — I872 Venous insufficiency (chronic) (peripheral): Secondary | ICD-10-CM | POA: Diagnosis not present

## 2021-01-19 DIAGNOSIS — J45909 Unspecified asthma, uncomplicated: Secondary | ICD-10-CM | POA: Diagnosis not present

## 2021-01-19 DIAGNOSIS — H9190 Unspecified hearing loss, unspecified ear: Secondary | ICD-10-CM | POA: Diagnosis not present

## 2021-01-19 DIAGNOSIS — E039 Hypothyroidism, unspecified: Secondary | ICD-10-CM | POA: Diagnosis not present

## 2021-01-19 DIAGNOSIS — G4700 Insomnia, unspecified: Secondary | ICD-10-CM | POA: Diagnosis not present

## 2021-01-19 DIAGNOSIS — K219 Gastro-esophageal reflux disease without esophagitis: Secondary | ICD-10-CM | POA: Diagnosis not present

## 2021-01-19 DIAGNOSIS — I110 Hypertensive heart disease with heart failure: Secondary | ICD-10-CM | POA: Diagnosis not present

## 2021-01-19 DIAGNOSIS — K5730 Diverticulosis of large intestine without perforation or abscess without bleeding: Secondary | ICD-10-CM | POA: Diagnosis not present

## 2021-01-19 DIAGNOSIS — I252 Old myocardial infarction: Secondary | ICD-10-CM | POA: Diagnosis not present

## 2021-01-19 DIAGNOSIS — E11319 Type 2 diabetes mellitus with unspecified diabetic retinopathy without macular edema: Secondary | ICD-10-CM | POA: Diagnosis not present

## 2021-01-19 DIAGNOSIS — I509 Heart failure, unspecified: Secondary | ICD-10-CM | POA: Diagnosis not present

## 2021-01-19 DIAGNOSIS — I480 Paroxysmal atrial fibrillation: Secondary | ICD-10-CM | POA: Diagnosis not present

## 2021-01-19 DIAGNOSIS — G43909 Migraine, unspecified, not intractable, without status migrainosus: Secondary | ICD-10-CM | POA: Diagnosis not present

## 2021-01-20 ENCOUNTER — Ambulatory Visit (INDEPENDENT_AMBULATORY_CARE_PROVIDER_SITE_OTHER): Payer: Medicare Other | Admitting: *Deleted

## 2021-01-20 ENCOUNTER — Ambulatory Visit: Payer: Medicare Other | Admitting: Podiatry

## 2021-01-20 ENCOUNTER — Other Ambulatory Visit: Payer: Self-pay

## 2021-01-20 DIAGNOSIS — E039 Hypothyroidism, unspecified: Secondary | ICD-10-CM | POA: Diagnosis not present

## 2021-01-20 DIAGNOSIS — G4700 Insomnia, unspecified: Secondary | ICD-10-CM | POA: Diagnosis not present

## 2021-01-20 DIAGNOSIS — M810 Age-related osteoporosis without current pathological fracture: Secondary | ICD-10-CM

## 2021-01-20 DIAGNOSIS — E782 Mixed hyperlipidemia: Secondary | ICD-10-CM | POA: Diagnosis not present

## 2021-01-20 DIAGNOSIS — I110 Hypertensive heart disease with heart failure: Secondary | ICD-10-CM | POA: Diagnosis not present

## 2021-01-20 DIAGNOSIS — I2510 Atherosclerotic heart disease of native coronary artery without angina pectoris: Secondary | ICD-10-CM | POA: Diagnosis not present

## 2021-01-20 DIAGNOSIS — I509 Heart failure, unspecified: Secondary | ICD-10-CM | POA: Diagnosis not present

## 2021-01-20 DIAGNOSIS — G4733 Obstructive sleep apnea (adult) (pediatric): Secondary | ICD-10-CM | POA: Diagnosis not present

## 2021-01-20 DIAGNOSIS — E1142 Type 2 diabetes mellitus with diabetic polyneuropathy: Secondary | ICD-10-CM | POA: Diagnosis not present

## 2021-01-20 MED ORDER — DENOSUMAB 60 MG/ML ~~LOC~~ SOSY
60.0000 mg | PREFILLED_SYRINGE | Freq: Once | SUBCUTANEOUS | Status: AC
  Administered 2021-01-20: 60 mg via SUBCUTANEOUS

## 2021-01-20 NOTE — Progress Notes (Signed)
Per orders of Dr Kelton Pillar, injection of denosumab (PROLIA) injection 60 mg given by Jacqualin Combes, CMA.. Patient tolerated injection well.

## 2021-01-22 ENCOUNTER — Encounter: Payer: Self-pay | Admitting: Podiatry

## 2021-01-22 ENCOUNTER — Ambulatory Visit (INDEPENDENT_AMBULATORY_CARE_PROVIDER_SITE_OTHER): Payer: Medicare Other | Admitting: Podiatry

## 2021-01-22 ENCOUNTER — Other Ambulatory Visit: Payer: Self-pay

## 2021-01-22 DIAGNOSIS — N1831 Chronic kidney disease, stage 3a: Secondary | ICD-10-CM | POA: Diagnosis not present

## 2021-01-22 DIAGNOSIS — E0842 Diabetes mellitus due to underlying condition with diabetic polyneuropathy: Secondary | ICD-10-CM | POA: Diagnosis not present

## 2021-01-22 DIAGNOSIS — M79675 Pain in left toe(s): Secondary | ICD-10-CM

## 2021-01-22 DIAGNOSIS — B351 Tinea unguium: Secondary | ICD-10-CM

## 2021-01-22 DIAGNOSIS — M79674 Pain in right toe(s): Secondary | ICD-10-CM

## 2021-01-22 DIAGNOSIS — Q828 Other specified congenital malformations of skin: Secondary | ICD-10-CM

## 2021-01-22 NOTE — Progress Notes (Signed)
This patient returns to my office for at risk foot care.  This patient requires this care by a professional since this patient will be at risk due to having diabetes and chronic kidney disease.  This patient is unable to cut nails herself since the patient cannot reach her nails.These nails are painful walking and wearing shoes.  She has a painful callus under her big toe joint right foot.  This is painful walking and wearing her shoes.    This patient presents for at risk foot care today.  General Appearance  Alert, conversant and in no acute stress.  Vascular  Dorsalis pedis and posterior tibial  pulses are weakly  palpable  bilaterally.  Capillary return is within normal limits  bilaterally. Temperature is within normal limits  bilaterally.  Neurologic  Senn-Weinstein monofilament wire test within normal limits/diminished   bilaterally. Muscle power within normal limits bilaterally.  Nails Thick disfigured discolored nails with subungual debris  from hallux to fifth toes bilaterally. No evidence of bacterial infection or drainage bilaterally.  Orthopedic  No limitations of motion  feet .  No crepitus or effusions noted.  No bony pathology or digital deformities noted.  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    Skin  normotropic skin  noted bilaterally.  No signs of infections or ulcers noted.  Callus sub 1st MPJ right foot.   Onychomycosis  Pain in right toes  Pain in left toes  Callus sub 1 right foot.  Plantar fasciitis right.  Consent was obtained for treatment procedures.   Mechanical debridement of nails 1-5  bilaterally performed with a nail nipper.  Filed with dremel without incident.  Debridement of callus with # 15 blade.  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 To schedule an appointment for diabetic shoes.   Return office visit  3 months                   Told patient to return for periodic foot care and evaluation due to potential at risk  complications.   Gardiner Barefoot DPM

## 2021-01-23 NOTE — Telephone Encounter (Signed)
Pt received inj 01/20/21 Next inj due 07/24/21

## 2021-01-24 NOTE — Progress Notes (Signed)
Cardiology Office Note:    Date:  01/26/2021   ID:  [name], DOB [birth year], MRN [id number]  PCP:  [name], MD   [facility] Providers Cardiologist:  Mertie Moores, MD      Referring MD: [name], MD   Chief Complaint:  Follow-up (CAD, CHF)    Patient Profile:    Megan [name] is a 75 y.o. female with:   Coronary artery disease   NSTEMI s/p CABG 04/2019  Post op AFib   HFimpEF (heart failure with improved ejection fraction)   Prior HFpEF  Ischemic CM, EF 25-30 at time of CABG in 04/2019  Echocardiogram 11/21: EF 65-70  Mild aortic stenosis   Echocardiogram 11/21: mean 17.5 mmHg  Leg edema   OSA  Hypertension   Diabetes mellitus   Hyperlipidemia   Chronic kidney disease   Gout   Hypothyroidism   Prior CV studies: Echocardiogram 07/07/20 EF 65-70, no RWMA, Gr 1 DD, normal RVSF, RVSP 29.8, trivial AI, mild AS (mean 17.5 mmHg, Vmax 209 cm/s, DI 0.79)  Echocardiogram 05/13/2019 EF 25-30, ant-sept and inf-sept AK, apical ant, apical lat, apical inf and apical AK  Pre-CABG Dopplers 05/14/2019 Bilat ICA 1-39  LEFT HEART CATH AND CORONARY ANGIOGRAPHY 05/13/2019 Narrative  Mid LM lesion is 30% stenosed.  Ost LAD lesion is 95% stenosed.  Prox RCA to Mid RCA lesion is 45% stenosed.  Dist RCA lesion is 65% stenosed.  There is moderate left ventricular systolic dysfunction.  LV end diastolic pressure is moderately elevated.  The left ventricular ejection fraction is 35-45% by visual estimate. 1. Critical ostial LAD stenosis. Heavily calcified. 2. Moderate distal RCA disease 3. Moderate LV dysfunction with anteroapical wall motion abnormality 4. Moderately elevated LVEDP Plan: LAD stenosis is poorly suited for PCI with heavy calcification and ostial location. Recommend CT surgery consultation. Will hold Plavix. Since patient is ASA allergic will start Aggrastat IV per pharmacy. Resume IV heparin in 8  hours.  Echocardiogram 09/09/2018 Moderate concentric LVH, EF 55-60, dynamic obstruction at rest with peak velocity 157 cm/s; peak gradient 10 mmHg, normal wall motion, abnormal relaxation (grade 1 diastolic dysfunction), trivial TR, PASP 40, small pericardial effusion  Echocardiogram 03/13/2015 Mild focal basal septal hypertrophy, vigorous systolic function, EF 42-68, no dynamic obstruction, normal wall motion, grade 1 diastolic dysfunction, trivial AI.   History of Present Illness: Ms. Luedke was last seen by Dr. Acie Fredrickson in 11/21.  She returns for f/u.  She is here alone.  Over the past few weeks, she has had hot flashes.  She does not have any sweating, fevers, night sweats.  She has not had any weight loss.  Recent TSH was normal.  She has not had chest discomfort, shortness of breath, syncope, orthopnea.  Her leg edema is stable.  She wears compression stockings.  She has not had any palpitations.        Past Medical History:  Diagnosis Date  . Allergy    bee stings  . Anxiety   . Asthmatic bronchitis   . Broken arm 1957/2008   right  . Cataract    had surgery   . Depression   . Diabetes mellitus without complication ([place])    prediabetes diet controlled  . Diverticulosis   . GERD (gastroesophageal reflux disease)   . Glaucoma    BIL  . Hemorrhoids   . Hx of adenomatous colonic polyps 12/02/2010  . Hyperlipidemia   . Hypertension   . Hypothyroidism 05/2018  . Inflammatory arthritis   .  Knee bursitis   . Migraines   . Obesity   . Osteoarthritis   . Sleep apnea     Current Medications: Current Meds  Medication Sig  . acetaminophen (TYLENOL) 650 MG CR tablet Take 650 mg by mouth every 8 (eight) hours.  [name] allopurinol (ZYLOPRIM) 100 MG tablet Take 1 tablet (100 mg total) by mouth daily.  [name] atorvastatin (LIPITOR) 80 MG tablet Take 1 tablet (80 mg total) by mouth daily at 6 PM.  . calcium carbonate (TUMS - DOSED IN MG ELEMENTAL CALCIUM) 500 MG chewable tablet Chew 1-2  tablets by mouth as needed for indigestion or heartburn.  . clopidogrel (PLAVIX) 75 MG tablet Take 1 tablet (75 mg total) by mouth daily.  . CVS B6 100 MG tablet Take 1 tablet by mouth at bedtime.  . diclofenac sodium (VOLTAREN) 1 % GEL Apply 2 gm topically to left knee  . EPINEPHrine 0.3 mg/0.3 mL IJ SOAJ injection Inject 0.3 mg into the muscle once as needed for anaphylaxis.   . famotidine (PEPCID) 20 MG tablet Take 20 mg by mouth 2 (two) times daily.  [name] levothyroxine (SYNTHROID) 75 MCG tablet Take 75 mcg by mouth daily before breakfast.  . Lidocaine (ASPERCREME LIDOCAINE) 4 % PTCH Place 1 patch onto the skin daily. APPLY TOPICALLY TO LOWER BACK ONCE DAILY FOR PAIN  . loperamide (IMODIUM A-D) 2 MG tablet 1 tablet as needed  . LORazepam (ATIVAN) 0.5 MG tablet Take 0.5 mg by mouth every 8 (eight) hours.  [name] losartan (COZAAR) 25 MG tablet Take 0.5 tablets (12.5 mg total) by mouth daily.  . Magnesium Oxide 400 MG CAPS Take 1 capsule (400 mg total) by mouth daily.  . meclizine (ANTIVERT) 25 MG tablet Take 25 mg by mouth 3 (three) times daily as needed for dizziness.   . Melatonin 3 MG TABS Take 1 tablet (3 mg total) by mouth at bedtime. FOR INSOMNIA  . metoprolol tartrate (LOPRESSOR) 25 MG tablet metoprolol tartrate 25 mg tablet  TAKE 1/2 TABLET BY MOUTH 2 TIMES DAILY IF SBP  110  . metoprolol tartrate (LOPRESSOR) 25 MG tablet Take 0.5 tablets (12.5 mg total) by mouth 2 (two) times daily.  . Multiple Vitamins-Iron (MULTIVITAMINS WITH IRON) TABS tablet Take 1 tablet by mouth daily.  . ondansetron (ZOFRAN) 4 MG tablet Take 1 tablet (4 mg total) by mouth every 6 (six) hours as needed for nausea or vomiting. GIVE 1 TABLET BY MOUTH EVERY 6 HOURS AS NEEDED FOR NAUSEA/VOMITING  . ondansetron (ZOFRAN-ODT) 4 MG disintegrating tablet 1 tablet on the tongue and allow to dissolve  . ONETOUCH VERIO test strip USE TO TEST BLOOD GLUCOSE ONCE DAILY  . pantoprazole (PROTONIX) 20 MG tablet Take 20 mg by mouth daily.   . polyvinyl alcohol (LIQUIFILM TEARS) 1.4 % ophthalmic solution Place 1 drop into both eyes every 4 (four) hours as needed for dry eyes.  . potassium chloride SA (KLOR-CON) 20 MEQ tablet Take 1 tablet by mouth once a week. On Wednesday  . predniSONE (DELTASONE) 10 MG tablet Take 10 mg by mouth as needed.  [name] spironolactone (ALDACTONE) 25 MG tablet Take 12.5 mg by mouth at bedtime.   . traMADol (ULTRAM) 50 MG tablet Take 50 mg by mouth 4 (four) times daily as needed.  . vitamin B-12 (CYANOCOBALAMIN) 1000 MCG tablet 1 tab by orally daily  . Vitamin D, Ergocalciferol, (DRISDOL) 1.25 MG (50000 UNIT) CAPS capsule Take 50,000 Units by mouth every Thursday.  . [DISCONTINUED] furosemide (LASIX) 40  MG tablet Take 1 tablet (40 mg total) by mouth daily.  . [DISCONTINUED] torsemide (DEMADEX) 20 MG tablet torsemide 20 mg tablet  TAKE 1 TABLET BY MOUTH EVERY DAY     Allergies:   Aspirin, Bee pollen, Codeine, Fish oil, Sulfa antibiotics, Hornet venom, Iron, Lorazepam, Pantoprazole, Tape, Tramadol hcl, Camphor, Lisinopril, Penicillins, Sulfasalazine, and Vicodin [hydrocodone-acetaminophen]   Social History   Tobacco Use  . Smoking status: Former Smoker    Types: Cigarettes    Quit date: 08/30/1971    Years since quitting: 49.4  . Smokeless tobacco: Never Used  Vaping Use  . Vaping Use: Never used  Substance Use Topics  . Alcohol use: No    Alcohol/week: 0.0 standard drinks  . Drug use: No     Family Hx: The patient's family history includes Asthma in her brother and sister; Bone cancer in her maternal grandfather; Breast cancer in her brother; COPD in her sister; Clotting disorder in her sister; Colon polyps in her maternal grandfather; Diabetes in her brother, father, mother, and sister; Heart Problems in her father; Hypertension in her father and mother; Obesity in an other family member; Parkinson's disease in her mother. There is no history of Rectal cancer, Stomach cancer, or Esophageal  cancer.  Review of Systems  Constitutional:       +hot flashes     EKGs/Labs/Other Test Reviewed:    EKG:  EKG is not ordered today.  The ekg ordered today demonstrates n/a  Recent Labs: 09/25/2020: Hemoglobin 13.0; Platelets 262 01/01/2021: ALT 18; BUN 29; Creatinine, Ser 0.99; Potassium 4.0; Sodium 141; TSH 0.58   Recent Lipid Panel Lab Results  Component Value Date/Time   CHOL 117 01/07/2020 09:41 AM   TRIG 163 (H) 01/07/2020 09:41 AM   HDL 45 01/07/2020 09:41 AM   LDLCALC 45 01/07/2020 09:41 AM   Labs obtained through [facility] - personally reviewed and interpreted: 01/20/2021: TC 128, HDL 52, LDL 54, triglycerides 121, A1c 7, creatinine 0.89, K+ 4.3, ALT 19, TSH 0.6 09/17/2020: Hgb 13   Risk Assessment/Calculations:      Physical Exam:    VS:  BP (!) 146/82   Pulse 79   Ht 5\' 2"  (1.575 m)   Wt 204 lb 3.2 oz (92.6 kg)   SpO2 98%   BMI 37.35 kg/m     Wt Readings from Last 3 Encounters:  01/26/21 204 lb 3.2 oz (92.6 kg)  01/01/21 202 lb 4 oz (91.7 kg)  11/26/20 193 lb 3.2 oz (87.6 kg)     Constitutional:      Appearance: Healthy appearance. Not in distress.  Pulmonary:     Effort: Pulmonary effort is normal.     Breath sounds: No wheezing. No rales.  Cardiovascular:     Normal rate. Regular rhythm. Normal S1. Normal S2.     Murmurs: There is a grade 2/6 crescendo-decrescendo systolic murmur at the URSB.  Edema:    Pretibial: bilateral trace edema of the pretibial area.    Comments: Compression stockings in place Abdominal:     Palpations: Abdomen is soft.  Musculoskeletal:     Cervical back: Neck supple. Skin:    General: Skin is warm and dry.  Neurological:     Mental Status: Alert and oriented to person, place and time.     Cranial Nerves: Cranial nerves are intact.         ASSESSMENT & PLAN:    1. Coronary artery disease involving native coronary artery of native heart without  angina pectoris Status post non-STEMI followed by CABG in 04/2019.   She is doing well without anginal symptoms.  Recent LDL optimal.  Continue clopidogrel, atorvastatin, metoprolol tartrate.  Follow-up 6 months.  2. HFimpEF (Heart Failure with improved EF) EF was 25-30 at the time of her myocardial infarction but improved to normal by echocardiogram in 11/21.  We discussed the importance of daily weights and when to contact us.  We also discussed the importance of low-salt diet.  Volume status stable.  NYHA IIb.  Continue current dose of furosemide, losartan, metoprolol tartrate, spironolactone.  3. Aortic valve stenosis, etiology of cardiac valve disease unspecified Mild by recent echocardiogram.  She will need follow-up echocardiography in the next 2 to 3 years.  4. Essential hypertension Blood pressure above goal.  We discussed the importance of good blood pressure control.  I will try to provide her with a blood pressure cuff today.  Of asked her to monitor this and notify us if her blood pressure is running high.  5. Hot flashes Etiology of her symptoms or not clear.  Have asked her to follow-up with primary care for further investigation.    Dispo:  Return in about 6 months (around 07/28/2021) for Routine Follow Up, w/ Dr. Acie Fredrickson.   Medication Adjustments/Labs and Tests Ordered: Current medicines are reviewed at length with the patient today.  Concerns regarding medicines are outlined above.  Tests Ordered: No orders of the defined types were placed in this encounter.  Medication Changes: Meds ordered this encounter  Medications  . furosemide (LASIX) 40 MG tablet    Sig: Take 1 tablet (40 mg total) by mouth 2 (two) times daily.    Dispense:  90 tablet    Refill:  3  . metoprolol tartrate (LOPRESSOR) 25 MG tablet    Sig: Take 0.5 tablets (12.5 mg total) by mouth 2 (two) times daily.    Dispense:  90 tablet    Refill:  3    Signed, Richardson Dopp, PA-C  01/26/2021 10:45 AM    [facility] [address], [place], [place]   [address] Phone: [phone]; Fax: [phone]

## 2021-01-26 ENCOUNTER — Encounter: Payer: Self-pay | Admitting: Physician Assistant

## 2021-01-26 ENCOUNTER — Other Ambulatory Visit: Payer: Self-pay

## 2021-01-26 ENCOUNTER — Ambulatory Visit (INDEPENDENT_AMBULATORY_CARE_PROVIDER_SITE_OTHER): Payer: Medicare Other | Admitting: Physician Assistant

## 2021-01-26 VITALS — BP 146/82 | HR 79 | Ht 62.0 in | Wt 204.2 lb

## 2021-01-26 DIAGNOSIS — I2510 Atherosclerotic heart disease of native coronary artery without angina pectoris: Secondary | ICD-10-CM | POA: Diagnosis not present

## 2021-01-26 DIAGNOSIS — I350 Nonrheumatic aortic (valve) stenosis: Secondary | ICD-10-CM

## 2021-01-26 DIAGNOSIS — I5032 Chronic diastolic (congestive) heart failure: Secondary | ICD-10-CM

## 2021-01-26 DIAGNOSIS — R232 Flushing: Secondary | ICD-10-CM | POA: Diagnosis not present

## 2021-01-26 DIAGNOSIS — I10 Essential (primary) hypertension: Secondary | ICD-10-CM | POA: Diagnosis not present

## 2021-01-26 MED ORDER — METOPROLOL TARTRATE 25 MG PO TABS: 12.5000 mg | ORAL_TABLET | Freq: Two times a day (BID) | ORAL | 3 refills | Status: AC

## 2021-01-26 MED ORDER — FUROSEMIDE 40 MG PO TABS: 40.0000 mg | ORAL_TABLET | Freq: Two times a day (BID) | ORAL | 3 refills | Status: AC

## 2021-01-26 NOTE — Patient Instructions (Addendum)
Medication Instructions: Your physician recommends that you continue on your current medications as directed. Please refer to the Current Medication list given to you today.  *If you need a refill on your cardiac medications before your next appointment, please call your pharmacy*   Lab Work: -None  If you have labs (blood work) drawn today and your tests are completely normal, you will receive your results only by: [name] MyChart Message (if you have MyChart) OR . A paper copy in the mail If you have any lab test that is abnormal or we need to change your treatment, we will call you to review the results.   Testing/Procedures: -None   Follow-Up: At [facility], you and your health needs are our priority.  As part of our continuing mission to provide you with exceptional heart care, we have created designated Provider Care Teams.  These Care Teams include your primary Cardiologist (physician) and Advanced Practice Providers (APPs -  Physician Assistants and Nurse Practitioners) who all work together to provide you with the care you need, when you need it.  We recommend signing up for the patient portal called "MyChart".  Sign up information is provided on this After Visit Summary.  MyChart is used to connect with patients for Virtual Visits (Telemedicine).  Patients are able to view lab/test results, encounter notes, upcoming appointments, etc.  Non-urgent messages can be sent to your provider as well.   To learn more about what you can do with MyChart, go to [email or web address].    Your next appointment:   6 month(s)  The format for your next appointment:   In Person  Provider:   Mertie Moores, MD   Other Instructions  *See your primary care doctor to evaluate the hot flashes*  Your physician wants you to follow-up in: 6 months with Dr. Acie Fredrickson. You will receive a reminder letter in the mail two months in advance. If you don't receive a letter, please call our office to  schedule the follow-up appointment.   Check blood pressure daily, if BP remains above 130/80 X 3 times consecutively call office @ [phone] or send mychart message.   IF blood pressure is over 580 systolic ( the top number) it is OK to take Losartan one tablet by mouth ( 25 mg) X 1 to bring down BP.

## 2021-01-27 ENCOUNTER — Other Ambulatory Visit: Payer: Medicare Other

## 2021-01-28 DIAGNOSIS — D509 Iron deficiency anemia, unspecified: Secondary | ICD-10-CM | POA: Diagnosis not present

## 2021-01-28 DIAGNOSIS — M150 Primary generalized (osteo)arthritis: Secondary | ICD-10-CM | POA: Diagnosis not present

## 2021-01-28 DIAGNOSIS — G8929 Other chronic pain: Secondary | ICD-10-CM | POA: Diagnosis not present

## 2021-01-28 DIAGNOSIS — J45909 Unspecified asthma, uncomplicated: Secondary | ICD-10-CM | POA: Diagnosis not present

## 2021-01-28 DIAGNOSIS — I2510 Atherosclerotic heart disease of native coronary artery without angina pectoris: Secondary | ICD-10-CM | POA: Diagnosis not present

## 2021-01-28 DIAGNOSIS — G4700 Insomnia, unspecified: Secondary | ICD-10-CM | POA: Diagnosis not present

## 2021-01-28 DIAGNOSIS — I872 Venous insufficiency (chronic) (peripheral): Secondary | ICD-10-CM | POA: Diagnosis not present

## 2021-01-28 DIAGNOSIS — E039 Hypothyroidism, unspecified: Secondary | ICD-10-CM | POA: Diagnosis not present

## 2021-01-28 DIAGNOSIS — G4733 Obstructive sleep apnea (adult) (pediatric): Secondary | ICD-10-CM | POA: Diagnosis not present

## 2021-01-28 DIAGNOSIS — E11319 Type 2 diabetes mellitus with unspecified diabetic retinopathy without macular edema: Secondary | ICD-10-CM | POA: Diagnosis not present

## 2021-01-28 DIAGNOSIS — I110 Hypertensive heart disease with heart failure: Secondary | ICD-10-CM | POA: Diagnosis not present

## 2021-01-28 DIAGNOSIS — M064 Inflammatory polyarthropathy: Secondary | ICD-10-CM | POA: Diagnosis not present

## 2021-01-28 DIAGNOSIS — E1151 Type 2 diabetes mellitus with diabetic peripheral angiopathy without gangrene: Secondary | ICD-10-CM | POA: Diagnosis not present

## 2021-01-28 DIAGNOSIS — K219 Gastro-esophageal reflux disease without esophagitis: Secondary | ICD-10-CM | POA: Diagnosis not present

## 2021-01-28 DIAGNOSIS — G43909 Migraine, unspecified, not intractable, without status migrainosus: Secondary | ICD-10-CM | POA: Diagnosis not present

## 2021-01-28 DIAGNOSIS — H409 Unspecified glaucoma: Secondary | ICD-10-CM | POA: Diagnosis not present

## 2021-01-28 DIAGNOSIS — E1142 Type 2 diabetes mellitus with diabetic polyneuropathy: Secondary | ICD-10-CM | POA: Diagnosis not present

## 2021-01-28 DIAGNOSIS — I509 Heart failure, unspecified: Secondary | ICD-10-CM | POA: Diagnosis not present

## 2021-01-28 DIAGNOSIS — H9190 Unspecified hearing loss, unspecified ear: Secondary | ICD-10-CM | POA: Diagnosis not present

## 2021-01-28 DIAGNOSIS — I480 Paroxysmal atrial fibrillation: Secondary | ICD-10-CM | POA: Diagnosis not present

## 2021-01-28 DIAGNOSIS — K5730 Diverticulosis of large intestine without perforation or abscess without bleeding: Secondary | ICD-10-CM | POA: Diagnosis not present

## 2021-01-28 DIAGNOSIS — I252 Old myocardial infarction: Secondary | ICD-10-CM | POA: Diagnosis not present

## 2021-02-01 ENCOUNTER — Ambulatory Visit (INDEPENDENT_AMBULATORY_CARE_PROVIDER_SITE_OTHER): Payer: Medicare Other | Admitting: Podiatry

## 2021-02-01 ENCOUNTER — Encounter: Payer: Self-pay | Admitting: Podiatry

## 2021-02-01 ENCOUNTER — Other Ambulatory Visit: Payer: Self-pay

## 2021-02-01 DIAGNOSIS — Q828 Other specified congenital malformations of skin: Secondary | ICD-10-CM

## 2021-02-01 DIAGNOSIS — E0842 Diabetes mellitus due to underlying condition with diabetic polyneuropathy: Secondary | ICD-10-CM

## 2021-02-01 DIAGNOSIS — M722 Plantar fascial fibromatosis: Secondary | ICD-10-CM

## 2021-02-01 NOTE — Progress Notes (Addendum)
Patient presented for foam casting for 3 pair custom diabetic shoe inserts. Patient is measured with a [place] Device to be a size 9 medium  Diabetic shoes are chosen from the safe step catalog.  The shoes chosen are 822 (the black ones)  Patient will be contacted when the shoes and inserts are ready for pick up  Gardiner Barefoot DPM

## 2021-02-05 DIAGNOSIS — E1142 Type 2 diabetes mellitus with diabetic polyneuropathy: Secondary | ICD-10-CM | POA: Diagnosis not present

## 2021-02-05 DIAGNOSIS — I509 Heart failure, unspecified: Secondary | ICD-10-CM | POA: Diagnosis not present

## 2021-02-05 DIAGNOSIS — G4733 Obstructive sleep apnea (adult) (pediatric): Secondary | ICD-10-CM | POA: Diagnosis not present

## 2021-02-05 DIAGNOSIS — I2510 Atherosclerotic heart disease of native coronary artery without angina pectoris: Secondary | ICD-10-CM | POA: Diagnosis not present

## 2021-02-09 ENCOUNTER — Other Ambulatory Visit (HOSPITAL_BASED_OUTPATIENT_CLINIC_OR_DEPARTMENT_OTHER): Payer: Self-pay

## 2021-02-09 DIAGNOSIS — R0681 Apnea, not elsewhere classified: Secondary | ICD-10-CM

## 2021-02-09 DIAGNOSIS — G4733 Obstructive sleep apnea (adult) (pediatric): Secondary | ICD-10-CM

## 2021-02-09 DIAGNOSIS — G4710 Hypersomnia, unspecified: Secondary | ICD-10-CM

## 2021-02-09 DIAGNOSIS — R0683 Snoring: Secondary | ICD-10-CM

## 2021-03-16 ENCOUNTER — Ambulatory Visit (INDEPENDENT_AMBULATORY_CARE_PROVIDER_SITE_OTHER): Payer: Medicare Other

## 2021-03-16 ENCOUNTER — Other Ambulatory Visit: Payer: Self-pay

## 2021-03-16 DIAGNOSIS — E1140 Type 2 diabetes mellitus with diabetic neuropathy, unspecified: Secondary | ICD-10-CM | POA: Diagnosis not present

## 2021-03-16 DIAGNOSIS — M216X2 Other acquired deformities of left foot: Secondary | ICD-10-CM | POA: Diagnosis not present

## 2021-03-16 DIAGNOSIS — M216X1 Other acquired deformities of right foot: Secondary | ICD-10-CM | POA: Diagnosis not present

## 2021-03-16 DIAGNOSIS — L84 Corns and callosities: Secondary | ICD-10-CM | POA: Diagnosis not present

## 2021-03-16 DIAGNOSIS — E0842 Diabetes mellitus due to underlying condition with diabetic polyneuropathy: Secondary | ICD-10-CM

## 2021-03-16 NOTE — Progress Notes (Signed)
Patient in office today to pick-up diabetic shoes. Shoes and inserts were tried on and patient was satisfied with the fit. Patient was educated on the break-in process and was advised to call the office with any questions, comments, or concerns. Patient verbalized understanding.

## 2021-03-30 DIAGNOSIS — H353131 Nonexudative age-related macular degeneration, bilateral, early dry stage: Secondary | ICD-10-CM | POA: Diagnosis not present

## 2021-03-30 DIAGNOSIS — H52203 Unspecified astigmatism, bilateral: Secondary | ICD-10-CM | POA: Diagnosis not present

## 2021-03-30 DIAGNOSIS — E119 Type 2 diabetes mellitus without complications: Secondary | ICD-10-CM | POA: Diagnosis not present

## 2021-03-30 DIAGNOSIS — H401132 Primary open-angle glaucoma, bilateral, moderate stage: Secondary | ICD-10-CM | POA: Diagnosis not present

## 2021-04-12 ENCOUNTER — Ambulatory Visit (HOSPITAL_BASED_OUTPATIENT_CLINIC_OR_DEPARTMENT_OTHER): Payer: Medicare Other | Attending: Sleep Medicine | Admitting: Sleep Medicine

## 2021-04-12 ENCOUNTER — Other Ambulatory Visit: Payer: Self-pay

## 2021-04-12 VITALS — Ht 62.0 in | Wt 201.0 lb

## 2021-04-12 DIAGNOSIS — G4733 Obstructive sleep apnea (adult) (pediatric): Secondary | ICD-10-CM | POA: Diagnosis not present

## 2021-04-12 DIAGNOSIS — R0681 Apnea, not elsewhere classified: Secondary | ICD-10-CM

## 2021-04-12 DIAGNOSIS — G4710 Hypersomnia, unspecified: Secondary | ICD-10-CM

## 2021-04-12 DIAGNOSIS — R0683 Snoring: Secondary | ICD-10-CM

## 2021-04-22 DIAGNOSIS — G4733 Obstructive sleep apnea (adult) (pediatric): Secondary | ICD-10-CM | POA: Diagnosis not present

## 2021-04-22 NOTE — Procedures (Signed)
NAME: Megan [name] DATE OF BIRTH:  1946/01/23 MEDICAL RECORD NUMBER [id number]  LOCATION: [facility]  PHYSICIAN:  D   DATE OF STUDY: 04/12/2021  SLEEP STUDY TYPE: Positive Airway Pressure Titration               REFERRING PHYSICIAN: Elmarie Mainland, MD  CLINICAL INFORMATION Megan [name] is a 75 year old Female and was referred to the sleep center for evaluation of G47.33 OSA: Adult and Pediatric (327.23).  MEDICATIONS Patient self administered medications include: Synthroid. Medications administered during study include - Synthroid 75 mg at 05:10:00 AM  SLEEP STUDY TECHNIQUE The patient underwent an attended overnight level one polysomnography titration to assess the effects of CPAP therapy. The following variables were monitored: EEG (C4-A1, C3-A2, O1-A2, O2-A1), EOG, submental and leg EMG, ECG, oxyhemoglobin saturation by pulse oximetry, thoracic and abdominal respiratory effort belts, nasal/oral airflow by pressure sensor, body position sensor and snoring sensor. CPAP pressure was titrated to eliminate apneas, hypopneas and oxygen desaturation. Hypopneas were scored per AASM definition IB (4% desaturation)  The NPSG portion of the study ended at 1:08:52 AM . The CPAP titration was initiated at 1:19:40 AM AM with the CPAP portion of the study ending at 5:02:05 AM.  TECHNICAL COMMENTS Comments added by Technician: Patient was ordered as a split night study. Comments added by Scorer: N/A SLEEP ARCHITECTURE The recording time for the entire night was 417.6 minutes. The diagnostic portion was initiated at 10:04:31 PM and terminated at 1:08:52 AM. The time in bed was 184.3 minutes. EEG confirmed total sleep time was 172.5 minutes yielding a sleep efficiency of 93.6%. Sleep onset after lights out was 5.9 minutes with a REM latency of 118.5 minutes. The patient spent 4.9% of the night in stage N1 sleep, 88.4% in stage N2 sleep, 0.0% in stage N3  and 6.7% in REM. The Arousal Index was 24.3/hour.  The titration portion was initiated at 1:19:40 AM and terminated at 5:02:05 AM. The time in bed was 222.4 minutes. EEG confirmed total sleep time was 141.0 minutes yielding a sleep efficiency of 63.4%. Sleep onset after CPAP initiation was 6.2 minutes with a REM latency of 96.0 minutes. The patient spent 37.2% of the night in stage N1 sleep, 36.2% in stage N2 sleep, 0.0% in stage N3 and 26.6% in REM. The Arousal Index was 29.4/hour. RESPIRATORY PARAMETERS During the diagnostic portion, there were a total of 70 respiratory disturbances recorded; 0 apneas (0 obstructive, 0 mixed, 0 central), 30 hypopneas and 40 RERAs. The apnea/hypopnea index 10.4 was events/hour and the RDI was 24.3 events/hour. The central sleep apnea index was 0.0 events/hour. The REM AHI was 47.0/h and NREM AHI was 7.8/h. The REM RDI was 52.2 /h and NREM RDI was 22.4/h. The non supine AHI was 10.4/h; supine during 0.0% of sleep. The supine RDI was 0.0/h, and the non supine RDI was 24.35/h. Respiratory disturbances were associated with oxygen desaturation down to a nadir of 81.0 % during sleep. The mean oxygen saturation during the study was 93.4%. The cumulative time under 88% oxygen saturation was 3 minutes.    During the titration portion, the apnea/hypopnea index (AHI) was 0.0 events/hour and the RDI was 0.0 events/hour. The central sleep apnea index was events/hour. The most appropriate setting of CPAP was IPAP/EPAP 18/18 cm H2O. At this setting, the sleep efficiency was 99% and the patient was supine for 100%. The AHI was 0 events per hour(with 0 central events). Oxygen nadir was  94%.  LEG MOVEMENT DATA The periodic limb movement index was 0.0/hour with an associated arousal index of /hour. CARDIAC DATA The underlying cardiac rhythm was most consistent with sinus rhythm. Mean heart rate was 64.7 during diagnostic portion and 64.9 during titration portion of study. Additional  rhythm abnormalities include PVCs. IMPRESSIONS - Mild Obstructive Sleep apnea(OSA), optimal pressure attained. - Electrocardiographic data showed presence of PVCs. DIAGNOSIS - Obstructive Sleep Apnea (G47.33) RECOMMENDATIONS - Trial of CPAP therapy on 18 cm H2O with a Medium size Resmed Full Face Mask AirFit F20 mask and heated humidification. - Avoid alcohol, sedatives and other CNS depressants that may worsen sleep apnea and disrupt normal sleep architecture. - Sleep hygiene should be reviewed to assess factors that may improve sleep quality. - Weight management and regular exercise should be initiated or continued.    D  Diplomate, American Board of Internal Medicine  ELECTRONICALLY SIGNED ON:  04/22/2021, 11:35 AM [place] PH: [phone]   FX: [phone] [place]

## 2021-04-28 ENCOUNTER — Other Ambulatory Visit: Payer: Self-pay

## 2021-04-28 ENCOUNTER — Ambulatory Visit (INDEPENDENT_AMBULATORY_CARE_PROVIDER_SITE_OTHER): Payer: Medicare Other | Admitting: Podiatry

## 2021-04-28 ENCOUNTER — Encounter: Payer: Self-pay | Admitting: Podiatry

## 2021-04-28 DIAGNOSIS — E0842 Diabetes mellitus due to underlying condition with diabetic polyneuropathy: Secondary | ICD-10-CM

## 2021-04-28 DIAGNOSIS — M79675 Pain in left toe(s): Secondary | ICD-10-CM | POA: Diagnosis not present

## 2021-04-28 DIAGNOSIS — Q828 Other specified congenital malformations of skin: Secondary | ICD-10-CM

## 2021-04-28 DIAGNOSIS — M79674 Pain in right toe(s): Secondary | ICD-10-CM | POA: Diagnosis not present

## 2021-04-28 DIAGNOSIS — B351 Tinea unguium: Secondary | ICD-10-CM

## 2021-04-28 DIAGNOSIS — N1831 Chronic kidney disease, stage 3a: Secondary | ICD-10-CM

## 2021-04-28 NOTE — Progress Notes (Signed)
This patient returns to my office for at risk foot care.  This patient requires this care by a professional since this patient will be at risk due to having diabetes and chronic kidney disease.  This patient is unable to cut nails herself since the patient cannot reach her nails.These nails are painful walking and wearing shoes.  She has a painful callus under her big toe joint right foot.  This is painful walking and wearing her shoes.    This patient presents for at risk foot care today.  General Appearance  Alert, conversant and in no acute stress.  Vascular  Dorsalis pedis and posterior tibial  pulses are weakly  palpable  bilaterally.  Capillary return is within normal limits  bilaterally. Temperature is within normal limits  bilaterally.  Neurologic  Senn-Weinstein monofilament wire test within normal limits/diminished   bilaterally. Muscle power within normal limits bilaterally.  Nails Thick disfigured discolored nails with subungual debris  from hallux to fifth toes bilaterally. No evidence of bacterial infection or drainage bilaterally.  Orthopedic  No limitations of motion  feet .  No crepitus or effusions noted.  No bony pathology or digital deformities noted.  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    Skin  normotropic skin  noted bilaterally.  No signs of infections or ulcers noted.  Callus sub 1st MPJ right foot.   Onychomycosis  Pain in right toes  Pain in left toes  Callus sub 1 right foot.  Plantar fasciitis right.  Consent was obtained for treatment procedures.   Mechanical debridement of nails 1-5  bilaterally performed with a nail nipper.  Filed with dremel without incident.  Debridement of callus with # 15 blade.     Return office visit  3 months                   Told patient to return for periodic foot care and evaluation due to potential at risk complications.   Gardiner Barefoot DPM

## 2021-05-27 ENCOUNTER — Other Ambulatory Visit: Payer: Self-pay | Admitting: *Deleted

## 2021-05-27 DIAGNOSIS — E538 Deficiency of other specified B group vitamins: Secondary | ICD-10-CM

## 2021-05-27 DIAGNOSIS — D509 Iron deficiency anemia, unspecified: Secondary | ICD-10-CM

## 2021-05-28 ENCOUNTER — Inpatient Hospital Stay: Payer: Medicare Other | Attending: Hematology

## 2021-05-28 ENCOUNTER — Inpatient Hospital Stay (HOSPITAL_BASED_OUTPATIENT_CLINIC_OR_DEPARTMENT_OTHER): Payer: Medicare Other | Admitting: Hematology

## 2021-05-28 ENCOUNTER — Other Ambulatory Visit: Payer: Self-pay

## 2021-05-28 VITALS — BP 139/64 | HR 82 | Temp 97.9°F | Resp 18 | Wt 212.4 lb

## 2021-05-28 DIAGNOSIS — E785 Hyperlipidemia, unspecified: Secondary | ICD-10-CM | POA: Insufficient documentation

## 2021-05-28 DIAGNOSIS — Z79899 Other long term (current) drug therapy: Secondary | ICD-10-CM | POA: Diagnosis not present

## 2021-05-28 DIAGNOSIS — E1136 Type 2 diabetes mellitus with diabetic cataract: Secondary | ICD-10-CM | POA: Insufficient documentation

## 2021-05-28 DIAGNOSIS — G4730 Sleep apnea, unspecified: Secondary | ICD-10-CM | POA: Insufficient documentation

## 2021-05-28 DIAGNOSIS — D509 Iron deficiency anemia, unspecified: Secondary | ICD-10-CM | POA: Insufficient documentation

## 2021-05-28 DIAGNOSIS — M1380 Other specified arthritis, unspecified site: Secondary | ICD-10-CM | POA: Diagnosis not present

## 2021-05-28 DIAGNOSIS — M1990 Unspecified osteoarthritis, unspecified site: Secondary | ICD-10-CM | POA: Insufficient documentation

## 2021-05-28 DIAGNOSIS — I10 Essential (primary) hypertension: Secondary | ICD-10-CM | POA: Insufficient documentation

## 2021-05-28 DIAGNOSIS — E039 Hypothyroidism, unspecified: Secondary | ICD-10-CM | POA: Insufficient documentation

## 2021-05-28 DIAGNOSIS — Z803 Family history of malignant neoplasm of breast: Secondary | ICD-10-CM | POA: Diagnosis not present

## 2021-05-28 DIAGNOSIS — D649 Anemia, unspecified: Secondary | ICD-10-CM

## 2021-05-28 DIAGNOSIS — E538 Deficiency of other specified B group vitamins: Secondary | ICD-10-CM

## 2021-05-28 DIAGNOSIS — I872 Venous insufficiency (chronic) (peripheral): Secondary | ICD-10-CM | POA: Insufficient documentation

## 2021-05-28 DIAGNOSIS — E669 Obesity, unspecified: Secondary | ICD-10-CM | POA: Diagnosis not present

## 2021-05-28 DIAGNOSIS — Z86010 Personal history of colonic polyps: Secondary | ICD-10-CM | POA: Insufficient documentation

## 2021-05-28 LAB — CMP (CANCER CENTER ONLY)
ALT: 20 U/L (ref 0–44)
AST: 18 U/L (ref 15–41)
Albumin: 3.9 g/dL (ref 3.5–5.0)
Alkaline Phosphatase: 135 U/L — ABNORMAL HIGH (ref 38–126)
Anion gap: 13 (ref 5–15)
BUN: 29 mg/dL — ABNORMAL HIGH (ref 8–23)
CO2: 26 mmol/L (ref 22–32)
Calcium: 8.6 mg/dL — ABNORMAL LOW (ref 8.9–10.3)
Chloride: 103 mmol/L (ref 98–111)
Creatinine: 0.96 mg/dL (ref 0.44–1.00)
GFR, Estimated: >60 mL/min (ref >60)
Glucose, Bld: 144 mg/dL — ABNORMAL HIGH (ref 70–99)
Potassium: 3.9 mmol/L (ref 3.5–5.1)
Sodium: 142 mmol/L (ref 135–145)
Total Bilirubin: 0.3 mg/dL (ref 0.3–1.2)
Total Protein: 7.3 g/dL (ref 6.5–8.1)

## 2021-05-28 LAB — IRON AND TIBC
Iron: 55 ug/dL (ref 41–142)
Saturation Ratios: 20 % — ABNORMAL LOW (ref 21–57)
TIBC: 274 ug/dL (ref 236–444)
UIBC: 219 ug/dL (ref 120–384)

## 2021-05-28 LAB — CBC WITH DIFFERENTIAL (CANCER CENTER ONLY)
Abs Immature Granulocytes: 0.04 10*3/uL (ref 0.00–0.07)
Basophils Absolute: 0 10*3/uL (ref 0.0–0.1)
Basophils Relative: 0 %
Eosinophils Absolute: 0.1 10*3/uL (ref 0.0–0.5)
Eosinophils Relative: 1 %
HCT: 38.7 % (ref 36.0–46.0)
Hemoglobin: 12.9 g/dL (ref 12.0–15.0)
Immature Granulocytes: 0 %
Lymphocytes Relative: 16 %
Lymphs Abs: 1.6 10*3/uL (ref 0.7–4.0)
MCH: 29.6 pg (ref 26.0–34.0)
MCHC: 33.3 g/dL (ref 30.0–36.0)
MCV: 88.8 fL (ref 80.0–100.0)
Monocytes Absolute: 0.6 10*3/uL (ref 0.1–1.0)
Monocytes Relative: 6 %
Neutro Abs: 7.7 10*3/uL (ref 1.7–7.7)
Neutrophils Relative %: 77 %
Platelet Count: 236 10*3/uL (ref 150–400)
RBC: 4.36 MIL/uL (ref 3.87–5.11)
RDW: 13 % (ref 11.5–15.5)
WBC Count: 10.1 10*3/uL (ref 4.0–10.5)
nRBC: 0 % (ref 0.0–0.2)

## 2021-05-28 LAB — VITAMIN B12: Vitamin B-12: 2096 pg/mL — ABNORMAL HIGH (ref 180–914)

## 2021-05-28 LAB — FERRITIN: Ferritin: 106 ng/mL (ref 11–307)

## 2021-06-03 NOTE — Progress Notes (Signed)
[name]    HEMATOLOGY/ONCOLOGY CLINIC NOTE  Date of Service: .05/28/2021   Patient Care Team: [name], MD as PCP - General (Family Medicine) Nahser, Wonda Cheng, MD as PCP - Cardiology (Cardiology)  CHIEF COMPLAINTS/PURPOSE OF CONSULTATION:  Normocytic Anemia  HISTORY OF PRESENTING ILLNESS:  Megan [name] is a wonderful 75 y.o. female who has been referred to [name] by Dr .[name], MD  for evaluation and management of normocytic anemia.  Patient has a h/o inflammatory arthritis, hypothyroidism, iron deficiency, HTN, DM2, glaucoma, asthma . Patient had recent labs with her PCP on 08/13/2018 which showed WBC 7.9k, HCT 30.4, MCV 86, platelets of 349k. Nl FT4 levels. Ferritin 32.  Interval History:   Megan [name] returns today for management and evaluation of her anemia. The patient's last visit with [name] was on 09/25/2020.   The pt reports no acute change in her energy level.  No overt evidence of GI bleeding.  Has good issues with her diabetic neuropathy and sleep apnea which are being managed.  Lab results today 05/28/2021 reviewed with the patient.  MEDICAL HISTORY:  Past Medical History:  Diagnosis Date   Allergy    bee stings   Anxiety    Asthmatic bronchitis    Broken arm 1957/2008   right   Cataract    had surgery    Depression    Diabetes mellitus without complication ([place])    prediabetes diet controlled   Diverticulosis    GERD (gastroesophageal reflux disease)    Glaucoma    BIL   Hemorrhoids    Hx of adenomatous colonic polyps 12/02/2010   Hyperlipidemia    Hypertension    Hypothyroidism 05/2018   Inflammatory arthritis    Knee bursitis    Migraines    Obesity    Osteoarthritis    Sleep apnea     SURGICAL HISTORY: Past Surgical History:  Procedure Laterality Date   CARPAL TUNNEL RELEASE Left    left wrist   CATARACT EXTRACTION W/ INTRAOCULAR LENS  IMPLANT, BILATERAL Bilateral    1999 left, 2008 right   COLONOSCOPY     CORONARY  ARTERY BYPASS GRAFT N/A 05/15/2019   Procedure: CORONARY ARTERY BYPASS GRAFTING (CABG) times two, left internal mammary artery to left anterior descending artery and left greater saphenous vein to posterior descending artery, left sapheouns vein harvested endoscopically ;  Surgeon: Grace Isaac, MD;  Location: [place];  Service: Open Heart Surgery;  Laterality: N/A;   EXCISION MORTON'S NEUROMA Left 1978   left foot   GLAUCOMA SURGERY Bilateral    and laser surgery, 2004 left, 2008 right   LEFT HEART CATH AND CORONARY ANGIOGRAPHY N/A 05/13/2019   Procedure: LEFT HEART CATH AND CORONARY ANGIOGRAPHY;  Surgeon: Martinique, Peter M, MD;  Location: [place];  Service: Cardiovascular;  Laterality: N/A;   ORIF RADIAL FRACTURE Left 12/13/2019   Procedure: OPEN REDUCTION INTERNAL FIXATION (ORIF) FOREARM FRACTURE;  Surgeon: Iran Planas, MD;  Location: [place];  Service: Orthopedics;  Laterality: Left;   PARTIAL HYSTERECTOMY  1991   Left an ovary   POLYPECTOMY     TEE WITHOUT CARDIOVERSION N/A 05/15/2019   Procedure: TRANSESOPHAGEAL ECHOCARDIOGRAM (TEE);  Surgeon: Grace Isaac, MD;  Location: [place];  Service: Open Heart Surgery;  Laterality: N/A;   TOENAIL AVULSION Right    big toe    SOCIAL HISTORY: Social History   Socioeconomic History   Marital status: Widowed    Spouse name: Lynnae Sandhoff   Number  of children: 0   Years of education: Not on file   Highest education level: Associate degree: occupational, Hotel manager, or vocational program  Occupational History   Occupation: retired  Tobacco Use   Smoking status: Former    Types: Cigarettes    Quit date: 08/30/1971    Years since quitting: 49.7   Smokeless tobacco: Never  Vaping Use   Vaping Use: Never used  Substance and Sexual Activity   Alcohol use: No    Alcohol/week: 0.0 standard drinks   Drug use: No   Sexual activity: Not on file  Other Topics Concern   Not on file  Social History Narrative   She is widowed, no children,  retired.  Lives in a one story home.  Retired from Geologist, engineering for [employer].   Social Determinants of Radio broadcast assistant Strain: Not on file  Food Insecurity: Not on file  Transportation Needs: Not on file  Physical Activity: Not on file  Stress: Not on file  Social Connections: Not on file  Intimate Partner Violence: Not on file    FAMILY HISTORY: Family History  Problem Relation Age of Onset   Colon polyps Maternal Grandfather    Bone cancer Maternal Grandfather    Hypertension Mother    Diabetes Mother    Parkinson's disease Mother    Hypertension Father    Diabetes Father    Heart Problems Father    Asthma Brother    Diabetes Brother    Breast cancer Brother        stage 4, both breast   Obesity Other    Diabetes Sister        x 2   COPD Sister    Asthma Sister    Clotting disorder Sister        blood count   Rectal cancer Neg Hx    Stomach cancer Neg Hx    Esophageal cancer Neg Hx     ALLERGIES:  is allergic to aspirin, bee pollen, codeine, fish oil, sulfa antibiotics, hornet venom, iron, lorazepam, pantoprazole, tape, tramadol hcl, camphor, lisinopril, penicillins, sulfasalazine, and vicodin [hydrocodone-acetaminophen].  MEDICATIONS:  Current Outpatient Medications  Medication Sig Dispense Refill   acetaminophen (TYLENOL) 650 MG CR tablet Take 650 mg by mouth every 8 (eight) hours.     allopurinol (ZYLOPRIM) 100 MG tablet Take 1 tablet (100 mg total) by mouth daily. 30 tablet 0   atorvastatin (LIPITOR) 80 MG tablet Take 1 tablet (80 mg total) by mouth daily at 6 PM. 30 tablet 2   calcium carbonate (TUMS - DOSED IN MG ELEMENTAL CALCIUM) 500 MG chewable tablet Chew 1-2 tablets by mouth as needed for indigestion or heartburn.     clopidogrel (PLAVIX) 75 MG tablet Take 1 tablet (75 mg total) by mouth daily. 30 tablet 2   CVS B6 100 MG tablet Take 1 tablet by mouth at bedtime.     diclofenac sodium (VOLTAREN) 1 % GEL Apply 2 gm topically to left knee 100 g  0   EPINEPHrine 0.3 mg/0.3 mL IJ SOAJ injection Inject 0.3 mg into the muscle once as needed for anaphylaxis.      famotidine (PEPCID) 20 MG tablet Take 20 mg by mouth 2 (two) times daily.     furosemide (LASIX) 40 MG tablet Take 1 tablet (40 mg total) by mouth 2 (two) times daily. 90 tablet 3   levothyroxine (SYNTHROID) 75 MCG tablet Take 75 mcg by mouth daily before breakfast.     Lidocaine (  ASPERCREME LIDOCAINE) 4 % PTCH Place 1 patch onto the skin daily. APPLY TOPICALLY TO LOWER BACK ONCE DAILY FOR PAIN 30 patch 0   loperamide (IMODIUM A-D) 2 MG tablet 1 tablet as needed     LORazepam (ATIVAN) 0.5 MG tablet Take 0.5 mg by mouth every 8 (eight) hours.     losartan (COZAAR) 25 MG tablet Take 0.5 tablets (12.5 mg total) by mouth daily. 45 tablet 3   Magnesium Oxide 400 MG CAPS Take 1 capsule (400 mg total) by mouth daily. 90 capsule 3   meclizine (ANTIVERT) 25 MG tablet Take 25 mg by mouth 3 (three) times daily as needed for dizziness.      Melatonin 3 MG TABS Take 1 tablet (3 mg total) by mouth at bedtime. FOR INSOMNIA 30 tablet 0   metoprolol tartrate (LOPRESSOR) 25 MG tablet metoprolol tartrate 25 mg tablet  TAKE 1/2 TABLET BY MOUTH 2 TIMES DAILY IF SBP  110     metoprolol tartrate (LOPRESSOR) 25 MG tablet Take 0.5 tablets (12.5 mg total) by mouth 2 (two) times daily. 90 tablet 3   Multiple Vitamins-Iron (MULTIVITAMINS WITH IRON) TABS tablet Take 1 tablet by mouth daily. 100 tablet 1   ondansetron (ZOFRAN) 4 MG tablet Take 1 tablet (4 mg total) by mouth every 6 (six) hours as needed for nausea or vomiting. GIVE 1 TABLET BY MOUTH EVERY 6 HOURS AS NEEDED FOR NAUSEA/VOMITING 20 tablet 0   ondansetron (ZOFRAN-ODT) 4 MG disintegrating tablet 1 tablet on the tongue and allow to dissolve     ONETOUCH VERIO test strip USE TO TEST BLOOD GLUCOSE ONCE DAILY 100 each 0   pantoprazole (PROTONIX) 20 MG tablet Take 20 mg by mouth daily.     polyvinyl alcohol (LIQUIFILM TEARS) 1.4 % ophthalmic solution Place  1 drop into both eyes every 4 (four) hours as needed for dry eyes. 15 mL 0   potassium chloride SA (KLOR-CON) 20 MEQ tablet Take 1 tablet by mouth once a week. On Wednesday     predniSONE (DELTASONE) 10 MG tablet Take 10 mg by mouth as needed.     spironolactone (ALDACTONE) 25 MG tablet Take 12.5 mg by mouth at bedtime.      traMADol (ULTRAM) 50 MG tablet Take 50 mg by mouth 4 (four) times daily as needed.     vitamin B-12 (CYANOCOBALAMIN) 1000 MCG tablet 1 tab by orally daily 30 tablet 0   Vitamin D, Ergocalciferol, (DRISDOL) 1.25 MG (50000 UNIT) CAPS capsule Take 50,000 Units by mouth every Thursday.     No current facility-administered medications for this visit.    REVIEW OF SYSTEMS:   .10 Point review of Systems was done is negative except as noted above.  PHYSICAL EXAMINATION: ECOG PERFORMANCE STATUS: 2 - Symptomatic, <50% confined to bed  Vitals:   05/28/21 1111  BP: 139/64  Pulse: 82  Resp: 18  Temp: 97.9 F (36.6 C)  SpO2: 98%   Filed Weights   05/28/21 1111  Weight: 212 lb 6.4 oz (96.3 kg)   .Body mass index is 38.85 kg/m. [name] GENERAL:alert, in no acute distress and comfortable SKIN: no acute rashes, no significant lesions EYES: conjunctiva are pink and non-injected, sclera anicteric OROPHARYNX: MMM, no exudates, no oropharyngeal erythema or ulceration NECK: supple, no JVD LYMPH:  no palpable lymphadenopathy in the cervical, axillary or inguinal regions LUNGS: clear to auscultation b/l with normal respiratory effort HEART: regular rate & rhythm ABDOMEN:  normoactive bowel sounds , non tender, not distended.  Extremity: no pedal edema PSYCH: alert & oriented x 3 with fluent speech NEURO: no focal motor/sensory deficits  LABORATORY DATA:  I have reviewed the data as listed  . CBC Latest Ref Rng & Units 05/28/2021 09/25/2020 02/24/2020  WBC 4.0 - 10.5 K/uL 10.1 9.9 8.0  Hemoglobin 12.0 - 15.0 g/dL 12.9 13.0 12.1  Hematocrit 36.0 - 46.0 % 38.7 40.1 36.7   Platelets 150 - 400 K/uL 236 262 236   . CMP Latest Ref Rng & Units 05/28/2021 01/01/2021 09/25/2020  Glucose 70 - 99 mg/dL 144(H) 127(H) 140(H)  BUN 8 - 23 mg/dL 29(H) 29(H) 34(H)  Creatinine 0.44 - 1.00 mg/dL 0.96 0.99 1.00  Sodium 135 - 145 mmol/L 142 141 141  Potassium 3.5 - 5.1 mmol/L 3.9 4.0 4.3  Chloride 98 - 111 mmol/L 103 103 104  CO2 22 - 32 mmol/L 26 26 27   Calcium 8.9 - 10.3 mg/dL 8.6(L) 8.9 9.9  Total Protein 6.5 - 8.1 g/dL 7.3 7.3 7.6  Total Bilirubin 0.3 - 1.2 mg/dL 0.3 0.3 <0.2(L)  Alkaline Phos 38 - 126 U/L 135(H) 132(H) 137(H)  AST 15 - 41 U/L 18 17 24   ALT 0 - 44 U/L 20 18 29    . Lab Results  Component Value Date   IRON 55 05/28/2021   TIBC 274 05/28/2021   IRONPCTSAT 20 (L) 05/28/2021   (Iron and TIBC)  Lab Results  Component Value Date   FERRITIN 106 05/28/2021   B12 - 2096  RADIOGRAPHIC STUDIES: I have personally reviewed the radiological images as listed and agreed with the findings in the report. No results found.  ASSESSMENT & PLAN:   75 y.o. female with   1) Normocytic Anemia Likely multifactorial.   Primarily anemia of chronic disease from b/l leg swelling and chronic venous stasis dermatitis. - patient recommended to f/u with PCP to optimize mx of her pedal edema and venous stasis dermatitis and wound care considerations. Some iron deficiency anemia Normal LDH - no evidence of hemolysis 09/04/18 Sed rate at 66 myeloma panel - no evidence of M spike  PLAN: -Discussed pt labwork today, 05/28/2021 CBC shows normal hemoglobin level of 12.9 with normal WBC count and platelets. -Ferritin level is 106 with an iron saturation of 20% -B12 low is within normal limits -No indication for additional IV iron at this time. -May reduce SL 1079mcg Vitamin B12 to 5 days a week  FOLLOW UP: Return to clinic with Dr. Irene Limbo with labs in 1 year  . The total time spent in the appointment was 20 minutes and more than 50% was on counseling and direct patient  cares.   All of the patient's questions were answered with apparent satisfaction. The patient knows to call the clinic with any problems, questions or concerns.    Sullivan Lone MD [place] AAHIVMS [facility] Hematology/Oncology Physician [facility]

## 2021-06-04 ENCOUNTER — Encounter: Payer: Self-pay | Admitting: Hematology

## 2021-06-05 NOTE — Telephone Encounter (Signed)
Prolia VOB initiated via [email or web address]  Last OV:  Next OV:  Last Prolia inj: 01/20/21 Next Prolia inj DUE: 11/26/220

## 2021-06-13 NOTE — Telephone Encounter (Signed)
Prior Auth required for Prolia.   PA PROCESS DETAILS: Please complete the prior authorization form located at [email or web address] or call [phone]

## 2021-06-23 DIAGNOSIS — E1142 Type 2 diabetes mellitus with diabetic polyneuropathy: Secondary | ICD-10-CM | POA: Diagnosis not present

## 2021-06-23 DIAGNOSIS — E039 Hypothyroidism, unspecified: Secondary | ICD-10-CM | POA: Diagnosis not present

## 2021-06-23 DIAGNOSIS — G4733 Obstructive sleep apnea (adult) (pediatric): Secondary | ICD-10-CM | POA: Diagnosis not present

## 2021-06-23 DIAGNOSIS — M159 Polyosteoarthritis, unspecified: Secondary | ICD-10-CM | POA: Diagnosis not present

## 2021-06-23 DIAGNOSIS — I509 Heart failure, unspecified: Secondary | ICD-10-CM | POA: Diagnosis not present

## 2021-06-23 DIAGNOSIS — I2510 Atherosclerotic heart disease of native coronary artery without angina pectoris: Secondary | ICD-10-CM | POA: Diagnosis not present

## 2021-06-23 DIAGNOSIS — M064 Inflammatory polyarthropathy: Secondary | ICD-10-CM | POA: Diagnosis not present

## 2021-06-23 DIAGNOSIS — E782 Mixed hyperlipidemia: Secondary | ICD-10-CM | POA: Diagnosis not present

## 2021-06-23 DIAGNOSIS — Z0000 Encounter for general adult medical examination without abnormal findings: Secondary | ICD-10-CM | POA: Diagnosis not present

## 2021-06-23 DIAGNOSIS — I110 Hypertensive heart disease with heart failure: Secondary | ICD-10-CM | POA: Diagnosis not present

## 2021-06-23 DIAGNOSIS — Z23 Encounter for immunization: Secondary | ICD-10-CM | POA: Diagnosis not present

## 2021-07-02 NOTE — Telephone Encounter (Signed)
Pt ready for scheduling on or after 07/24/21  Out-of-pocket cost due at time of visit: $0.00  Primary: [employer] Medicare Prolia co-insurance: 20% co-insurance Admin fee co-insurance: $30  Secondary: [employer] Prolia co-insurance: S: The secondary plan will coordinate benefits and will consider the patient's remaining cost share at 100% Admin fee co-insurance: S: The secondary plan will coordinate benefits and will consider the patient's remaining cost share at 100%  Deductible: does not apply  Prior Auth: APPROVED PA# [id number] Valid: 07/02/21-07/02/22    ** This summary of benefits is an estimation of the patient's out-of-pocket cost. Exact cost may very based on individual plan coverage.

## 2021-07-07 ENCOUNTER — Encounter: Payer: Self-pay | Admitting: Internal Medicine

## 2021-07-07 ENCOUNTER — Other Ambulatory Visit: Payer: Self-pay

## 2021-07-07 ENCOUNTER — Ambulatory Visit (INDEPENDENT_AMBULATORY_CARE_PROVIDER_SITE_OTHER): Payer: Medicare Other | Admitting: Internal Medicine

## 2021-07-07 VITALS — BP 132/80 | HR 80 | Ht 62.0 in | Wt 209.0 lb

## 2021-07-07 DIAGNOSIS — E039 Hypothyroidism, unspecified: Secondary | ICD-10-CM

## 2021-07-07 DIAGNOSIS — M810 Age-related osteoporosis without current pathological fracture: Secondary | ICD-10-CM | POA: Diagnosis not present

## 2021-07-07 MED ORDER — LEVOTHYROXINE SODIUM 75 MCG PO TABS: 75.0000 ug | ORAL_TABLET | Freq: Every day | ORAL | 3 refills | Status: DC

## 2021-07-07 NOTE — Patient Instructions (Signed)
-   Continue calcium  1200 mg daily  - Continue Ergocalciferol (vitamin D) 50,000 iu weekly  - Continue Levothyroxine 75 mcg daily

## 2021-07-07 NOTE — Progress Notes (Signed)
Name: Megan [name]  MRN/ DOB: [id number], 08-19-1946    Age/ Sex: 75 y.o., female    PCP: [name], MD   Reason for Endocrinology Evaluation: Clinical Osteoporosis      Date of Initial Endocrinology Evaluation: 01/01/2021    HPI: Megan [name] is a 75 y.o. female with a past medical history of T2DM, CAD, A. Fib . The patient presented for initial endocrinology clinic visit on 01/01/2021 for consultative assistance with her Osteoporosis     Pt was diagnosed with osteoporosis:09/2020  Menarche at age : 58 Menopausal at age : S/P hysterectomy 1992 Fracture Hx: left arm fracture 11/2019. S/P fall at home  Hx of HRT: no FH of osteoporosis or hip fracture: no Prior Hx of anti-estrogenic therapy : no  Prior Hx of anti-resorptive therapy : Received Prolia 01/20/2021    Has been on LT-4 replacement since 2019 . Takes it appropriately  Of note , she was noted to have low TSh at 0.33 uIU/mL during labs 08/2020   Today she denies any side effects to Prolia  No recent bone fractures nor falls  Has chronic loose stools    Calcium 1200 mg daily Ergocalciferol 50,000 IUs weekly Prolia 60 mg SQ every 6 months  HISTORY:  Past Medical History:  Past Medical History:  Diagnosis Date   Allergy    bee stings   Anxiety    Asthmatic bronchitis    Broken arm 1957/2008   right   Cataract    had surgery    Depression    Diabetes mellitus without complication ([place])    prediabetes diet controlled   Diverticulosis    GERD (gastroesophageal reflux disease)    Glaucoma    BIL   Hemorrhoids    Hx of adenomatous colonic polyps 12/02/2010   Hyperlipidemia    Hypertension    Hypothyroidism 05/2018   Inflammatory arthritis    Knee bursitis    Migraines    Obesity    Osteoarthritis    Sleep apnea    Past Surgical History:  Past Surgical History:  Procedure Laterality Date   CARPAL TUNNEL RELEASE Left    left wrist   CATARACT EXTRACTION W/ INTRAOCULAR LENS   IMPLANT, BILATERAL Bilateral    1999 left, 2008 right   COLONOSCOPY     CORONARY ARTERY BYPASS GRAFT N/A 05/15/2019   Procedure: CORONARY ARTERY BYPASS GRAFTING (CABG) times two, left internal mammary artery to left anterior descending artery and left greater saphenous vein to posterior descending artery, left sapheouns vein harvested endoscopically ;  Surgeon: Grace Isaac, MD;  Location: [facility];  Service: Open Heart Surgery;  Laterality: N/A;   EXCISION MORTON'S NEUROMA Left 1978   left foot   GLAUCOMA SURGERY Bilateral    and laser surgery, 2004 left, 2008 right   LEFT HEART CATH AND CORONARY ANGIOGRAPHY N/A 05/13/2019   Procedure: LEFT HEART CATH AND CORONARY ANGIOGRAPHY;  Surgeon: Martinique, Peter M, MD;  Location: [facility];  Service: Cardiovascular;  Laterality: N/A;   ORIF RADIAL FRACTURE Left 12/13/2019   Procedure: OPEN REDUCTION INTERNAL FIXATION (ORIF) FOREARM FRACTURE;  Surgeon: Iran Planas, MD;  Location: [facility];  Service: Orthopedics;  Laterality: Left;   PARTIAL HYSTERECTOMY  1991   Left an ovary   POLYPECTOMY     TEE WITHOUT CARDIOVERSION N/A 05/15/2019   Procedure: TRANSESOPHAGEAL ECHOCARDIOGRAM (TEE);  Surgeon: Grace Isaac, MD;  Location: [facility];  Service: Open Heart Surgery;  Laterality: N/A;  TOENAIL AVULSION Right    big toe    Social History:  reports that she quit smoking about 49 years ago. Her smoking use included cigarettes. She has never used smokeless tobacco. She reports that she does not drink alcohol and does not use drugs. Family History: family history includes Asthma in her brother and sister; Bone cancer in her maternal grandfather; Breast cancer in her brother; COPD in her sister; Clotting disorder in her sister; Colon polyps in her maternal grandfather; Diabetes in her brother, father, mother, and sister; Heart Problems in her father; Hypertension in her father and mother; Obesity in an other family member; Parkinson's disease in her  mother.   HOME MEDICATIONS: Allergies as of 07/07/2021       Reactions   Aspirin Anaphylaxis   Bee Pollen Swelling, Other (See Comments)   Extreme swelling due to bee stings   Codeine Nausea And Vomiting   Fish Oil Diarrhea   Sulfa Antibiotics Nausea And Vomiting   Hornet Venom Swelling, Other (See Comments)   Foot became swollen 3X it's normal size   Iron Other (See Comments)   Severe Headache   Lorazepam Other (See Comments)   Pantoprazole Diarrhea   Tape Other (See Comments)   "Tape leaves burn-like places and blisters"   Tramadol Hcl Other (See Comments)   Camphor Dermatitis   Lisinopril Cough   Penicillins Rash, Other (See Comments)   Swelling was of the    Sulfasalazine Nausea And Vomiting   Vicodin [hydrocodone-acetaminophen] Nausea And Vomiting        Medication List        Accurate as of July 07, 2021 10:44 AM. If you have any questions, ask your nurse or doctor.          acetaminophen 650 MG CR tablet Commonly known as: TYLENOL Take 650 mg by mouth every 8 (eight) hours.   allopurinol 100 MG tablet Commonly known as: ZYLOPRIM Take 1 tablet (100 mg total) by mouth daily.   atorvastatin 80 MG tablet Commonly known as: LIPITOR Take 1 tablet (80 mg total) by mouth daily at 6 PM.   calcium carbonate 500 MG chewable tablet Commonly known as: TUMS - dosed in mg elemental calcium Chew 1-2 tablets by mouth as needed for indigestion or heartburn.   clopidogrel 75 MG tablet Commonly known as: PLAVIX Take 1 tablet (75 mg total) by mouth daily.   CVS B6 100 MG tablet Generic drug: pyridoxine Take 1 tablet by mouth at bedtime.   diclofenac sodium 1 % Gel Commonly known as: VOLTAREN Apply 2 gm topically to left knee   EPINEPHrine 0.3 mg/0.3 mL Soaj injection Commonly known as: EPI-PEN Inject 0.3 mg into the muscle once as needed for anaphylaxis.   famotidine 20 MG tablet Commonly known as: PEPCID Take 20 mg by mouth 2 (two) times daily.    furosemide 40 MG tablet Commonly known as: LASIX Take 1 tablet (40 mg total) by mouth 2 (two) times daily.   levothyroxine 75 MCG tablet Commonly known as: SYNTHROID Take 75 mcg by mouth daily before breakfast.   Lidocaine 4 % Ptch Commonly known as: Aspercreme Lidocaine Place 1 patch onto the skin daily. APPLY TOPICALLY TO LOWER BACK ONCE DAILY FOR PAIN   loperamide 2 MG tablet Commonly known as: IMODIUM A-D 1 tablet as needed   LORazepam 0.5 MG tablet Commonly known as: ATIVAN Take 0.5 mg by mouth every 8 (eight) hours.   losartan 25 MG tablet Commonly known as: COZAAR  Take 0.5 tablets (12.5 mg total) by mouth daily.   Magnesium Oxide 400 MG Caps Take 1 capsule (400 mg total) by mouth daily.   meclizine 25 MG tablet Commonly known as: ANTIVERT Take 25 mg by mouth 3 (three) times daily as needed for dizziness.   melatonin 3 MG Tabs tablet Take 1 tablet (3 mg total) by mouth at bedtime. FOR INSOMNIA   metoprolol tartrate 25 MG tablet Commonly known as: LOPRESSOR metoprolol tartrate 25 mg tablet  TAKE 1/2 TABLET BY MOUTH 2 TIMES DAILY IF SBP  110   metoprolol tartrate 25 MG tablet Commonly known as: LOPRESSOR Take 0.5 tablets (12.5 mg total) by mouth 2 (two) times daily.   multivitamins with iron Tabs tablet Take 1 tablet by mouth daily.   ondansetron 4 MG disintegrating tablet Commonly known as: ZOFRAN-ODT 1 tablet on the tongue and allow to dissolve   ondansetron 4 MG tablet Commonly known as: ZOFRAN Take 1 tablet (4 mg total) by mouth every 6 (six) hours as needed for nausea or vomiting. GIVE 1 TABLET BY MOUTH EVERY 6 HOURS AS NEEDED FOR NAUSEA/VOMITING   OneTouch Verio test strip Generic drug: glucose blood USE TO TEST BLOOD GLUCOSE ONCE DAILY   pantoprazole 20 MG tablet Commonly known as: PROTONIX Take 20 mg by mouth daily.   polyvinyl alcohol 1.4 % ophthalmic solution Commonly known as: LIQUIFILM TEARS Place 1 drop into both eyes every 4 (four)  hours as needed for dry eyes.   potassium chloride SA 20 MEQ tablet Commonly known as: KLOR-CON Take 1 tablet by mouth once a week. On Wednesday   predniSONE 10 MG tablet Commonly known as: DELTASONE Take 10 mg by mouth as needed.   spironolactone 25 MG tablet Commonly known as: ALDACTONE Take 12.5 mg by mouth at bedtime. What changed: Another medication with the same name was removed. Continue taking this medication, and follow the directions you see here. Changed by: Dorita Sciara, MD   traMADol 50 MG tablet Commonly known as: ULTRAM Take 50 mg by mouth 4 (four) times daily as needed.   vitamin B-12 1000 MCG tablet Commonly known as: CYANOCOBALAMIN 1 tab by orally daily   Vitamin D (Ergocalciferol) 1.25 MG (50000 UNIT) Caps capsule Commonly known as: DRISDOL Take 50,000 Units by mouth every Thursday.          REVIEW OF SYSTEMS: A comprehensive ROS was conducted with the patient and is negative except as per HPI   OBJECTIVE:  VS: BP 132/80 (BP Location: Left Arm, Patient Position: Sitting, Cuff Size: Small)   Pulse 80   Ht 5\' 2"  (1.575 m)   Wt 209 lb (94.8 kg)   SpO2 99%   BMI 38.23 kg/m    Wt Readings from Last 3 Encounters:  07/07/21 209 lb (94.8 kg)  05/28/21 212 lb 6.4 oz (96.3 kg)  04/12/21 201 lb (91.2 kg)     EXAM: General: Pt appears well and is in NAD  Neck: General: Supple without adenopathy. Thyroid: Thyroid size normal.  Lungs: Clear with good BS bilat with no rales, rhonchi, or wheezes  Heart: Auscultation: RRR, + systolic murmur   Extremities: . BL LE: trace pretibial edema   Mental Status: Judgment, insight: Intact Orientation: Oriented to time, place, and person Mood and affect: No depression, anxiety, or agitation     DATA REVIEWED:  Results for RASHEEMA, TRULUCK (MRN [id number]) as of 07/07/2021 14:26  Ref. Range 05/28/2021 10:35  Sodium Latest Ref Range: 135 -  145 mmol/L 142  Potassium Latest Ref Range: 3.5 - 5.1 mmol/L  3.9  Chloride Latest Ref Range: 98 - 111 mmol/L 103  CO2 Latest Ref Range: 22 - 32 mmol/L 26  Glucose Latest Ref Range: 70 - 99 mg/dL 144 (H)  BUN Latest Ref Range: 8 - 23 mg/dL 29 (H)  Creatinine Latest Ref Range: 0.44 - 1.00 mg/dL 0.96  Calcium Latest Ref Range: 8.9 - 10.3 mg/dL 8.6 (L)  Anion gap Latest Ref Range: 5 - 15  13  Alkaline Phosphatase Latest Ref Range: 38 - 126 U/L 135 (H)  Albumin Latest Ref Range: 3.5 - 5.0 g/dL 3.9  AST Latest Ref Range: 15 - 41 U/L 18  ALT Latest Ref Range: 0 - 44 U/L 20  Total Protein Latest Ref Range: 6.5 - 8.1 g/dL 7.3  Total Bilirubin Latest Ref Range: 0.3 - 1.2 mg/dL 0.3  GFR, Est Non African American Latest Ref Range: >60 mL/min >60     06/23/2021 GLu 149 BUN/CR 31/1.06 GFR66 TSH 0.50  A1c 7.6%  DXA 10/24/2020  AP LUMBAR SPINE L1 and L4   Bone Mineral Density (BMD):  1.011 g/cm2   Young Adult T-Score:  -0.2   Z-Score:  0.2   Left FEMUR neck   Bone Mineral Density (BMD):  0.629 g/cm2   Young Adult T-Score: -2.0   Z-Score:  0.1   Unit: This study was performed at [facility] on the [place] (S/N [phone]), software version 13.4.2.   Scan quality: The scan quality is good. Exclusions: L2 and L3 due to degenerative changes.   ASSESSMENT: Patient's diagnostic category is LOW BONE MASS by WHO Criteria.  ASSESSMENT/PLAN/RECOMMENDATIONS:   Osteoporosis :  -Despite DXA scan results consistent with low bone density, the patient meets criteria for clinical osteoporosis given fragility fracture of the left arm. - She was started on Prolia in 12/2020 without side effects  - Emphasized the importance of calcium and vitamin D intake     Medications : Continue  calcium 1200 mg daily Continue ergocalciferol 50,000 IUs weekly Continue  Prolia 60 mg SQ every 6 months  2.  Hypothyroid:  - Pt is clinically euthyroid  - TSh normal at PCP's office    Medications Continue levothyroxine 75 MCG  daily     Follow-up in 1 yr     Signed electronically by: Mack Guise, MD  [facility] End[facility]  [address] [place], [place] [address] Phone: [phone] FAX: [phone]   CC: [name], MD [address] [place] [address] Phone: [phone] Fax: [phone]   Return to Endocrinology clinic as below: Future Appointments  Date Time Provider [place]  07/26/2021  9:40 AM Nahser, Wonda Cheng, MD [facility]  07/30/2021 10:30 AM Gardiner Barefoot, DPM [facility]  05/27/2022 10:00 AM [facility] None  05/27/2022 10:40 AM Brunetta Genera, MD [facility] None
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
## 2021-07-26 ENCOUNTER — Other Ambulatory Visit: Payer: Self-pay

## 2021-07-26 ENCOUNTER — Encounter: Payer: Self-pay | Admitting: Cardiovascular Disease

## 2021-07-26 ENCOUNTER — Ambulatory Visit (INDEPENDENT_AMBULATORY_CARE_PROVIDER_SITE_OTHER): Payer: Medicare Other | Admitting: Cardiovascular Disease

## 2021-07-26 VITALS — BP 132/80 | HR 86 | Ht 62.0 in | Wt 211.2 lb

## 2021-07-26 DIAGNOSIS — Z951 Presence of aortocoronary bypass graft: Secondary | ICD-10-CM

## 2021-07-26 DIAGNOSIS — E785 Hyperlipidemia, unspecified: Secondary | ICD-10-CM | POA: Diagnosis not present

## 2021-07-26 DIAGNOSIS — I10 Essential (primary) hypertension: Secondary | ICD-10-CM

## 2021-07-26 DIAGNOSIS — I2510 Atherosclerotic heart disease of native coronary artery without angina pectoris: Secondary | ICD-10-CM | POA: Diagnosis not present

## 2021-07-26 NOTE — Patient Instructions (Signed)
Medication Instructions:  Your physician recommends that you continue on your current medications as directed. Please refer to the Current Medication list given to you today.  *If you need a refill on your cardiac medications before your next appointment, please call your pharmacy*   Lab Work: none If you have labs (blood work) drawn today and your tests are completely normal, you will receive your results only by: [place] (if you have MyChart) OR A paper copy in the mail If you have any lab test that is abnormal or we need to change your treatment, we will call you to review the results.   Testing/Procedures: none   Follow-Up: At [facility], you and your health needs are our priority.  As part of our continuing mission to provide you with exceptional heart care, we have created designated Provider Care Teams.  These Care Teams include your primary Cardiologist (physician) and Advanced Practice Providers (APPs -  Physician Assistants and Nurse Practitioners) who all work together to provide you with the care you need, when you need it.  We recommend signing up for the patient portal called "MyChart".  Sign up information is provided on this After Visit Summary.  MyChart is used to connect with patients for Virtual Visits (Telemedicine).  Patients are able to view lab/test results, encounter notes, upcoming appointments, etc.  Non-urgent messages can be sent to your provider as well.   To learn more about what you can do with MyChart, go to [email or web address].    Your next appointment:   12 month(s)  The format for your next appointment:   In Person  Provider:   Mertie Moores, MD  or Robbie Lis, PA-C, Melina Copa, PA-C, Cecilie Kicks, NP, Ermalinda Barrios, PA-C, Christen Bame, NP, or Richardson Dopp, PA-C         Other Instructions

## 2021-07-28 ENCOUNTER — Other Ambulatory Visit: Payer: Self-pay

## 2021-07-28 ENCOUNTER — Ambulatory Visit: Payer: Medicare Other

## 2021-07-30 ENCOUNTER — Other Ambulatory Visit: Payer: Self-pay

## 2021-07-30 ENCOUNTER — Ambulatory Visit (INDEPENDENT_AMBULATORY_CARE_PROVIDER_SITE_OTHER): Payer: Medicare Other | Admitting: Podiatry

## 2021-07-30 ENCOUNTER — Encounter: Payer: Self-pay | Admitting: Podiatry

## 2021-07-30 DIAGNOSIS — Q828 Other specified congenital malformations of skin: Secondary | ICD-10-CM

## 2021-07-30 DIAGNOSIS — N1831 Chronic kidney disease, stage 3a: Secondary | ICD-10-CM

## 2021-07-30 DIAGNOSIS — M79674 Pain in right toe(s): Secondary | ICD-10-CM

## 2021-07-30 DIAGNOSIS — B351 Tinea unguium: Secondary | ICD-10-CM | POA: Diagnosis not present

## 2021-07-30 DIAGNOSIS — M79675 Pain in left toe(s): Secondary | ICD-10-CM

## 2021-07-30 DIAGNOSIS — E0842 Diabetes mellitus due to underlying condition with diabetic polyneuropathy: Secondary | ICD-10-CM

## 2021-07-30 NOTE — Progress Notes (Signed)
This patient returns to my office for at risk foot care.  This patient requires this care by a professional since this patient will be at risk due to having diabetes and chronic kidney disease.  This patient is unable to cut nails herself since the patient cannot reach her nails.These nails are painful walking and wearing shoes.  She has a painful callus under her big toe joint right foot.  This is painful walking and wearing her shoes.    This patient presents for at risk foot care today.  General Appearance  Alert, conversant and in no acute stress.  Vascular  Dorsalis pedis and posterior tibial  pulses are weakly  palpable  bilaterally.  Capillary return is within normal limits  bilaterally. Temperature is within normal limits  bilaterally.  Neurologic  Senn-Weinstein monofilament wire test within normal limits/diminished   bilaterally. Muscle power within normal limits bilaterally.  Nails Thick disfigured discolored nails with subungual debris  from hallux to fifth toes bilaterally. No evidence of bacterial infection or drainage bilaterally.  Orthopedic  No limitations of motion  feet .  No crepitus or effusions noted.  No bony pathology or digital deformities noted.  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    Skin  normotropic skin  noted bilaterally.  No signs of infections or ulcers noted.  Callus sub 1st MPJ right foot.   Onychomycosis  Pain in right toes  Pain in left toes  Callus sub 1 right foot.    Consent was obtained for treatment procedures.   Mechanical debridement of nails 1-5  bilaterally performed with a nail nipper.  Filed with dremel without incident.  Debridement of callus with # 15 blade.  Padding applied to diabetic insole.   Return office visit  3 months                   Told patient to return for periodic foot care and evaluation due to potential at risk complications.   Gardiner Barefoot DPM

## 2021-08-03 ENCOUNTER — Other Ambulatory Visit: Payer: Self-pay

## 2021-08-03 ENCOUNTER — Ambulatory Visit (INDEPENDENT_AMBULATORY_CARE_PROVIDER_SITE_OTHER): Payer: Medicare Other

## 2021-08-03 DIAGNOSIS — M810 Age-related osteoporosis without current pathological fracture: Secondary | ICD-10-CM

## 2021-08-03 MED ORDER — DENOSUMAB 60 MG/ML ~~LOC~~ SOSY
60.0000 mg | PREFILLED_SYRINGE | Freq: Once | SUBCUTANEOUS | Status: AC
Start: 2021-08-03 — End: 2021-08-03
  Administered 2021-08-03: 60 mg via SUBCUTANEOUS

## 2021-08-03 NOTE — Progress Notes (Signed)
Prolia injection administer left arm sub.q. Patient tolerated well.

## 2021-08-04 ENCOUNTER — Ambulatory Visit: Payer: Medicare Other

## 2021-08-08 NOTE — Telephone Encounter (Signed)
Last Prolia inj 08/03/21 Next Prolia inj due 02/02/22

## 2021-09-02 DIAGNOSIS — G4733 Obstructive sleep apnea (adult) (pediatric): Secondary | ICD-10-CM | POA: Diagnosis not present

## 2021-09-02 DIAGNOSIS — I10 Essential (primary) hypertension: Secondary | ICD-10-CM | POA: Diagnosis not present

## 2021-09-02 DIAGNOSIS — G4700 Insomnia, unspecified: Secondary | ICD-10-CM | POA: Diagnosis not present

## 2021-09-20 ENCOUNTER — Other Ambulatory Visit: Payer: Self-pay | Admitting: Adult Health

## 2021-09-20 DIAGNOSIS — M1A9XX0 Chronic gout, unspecified, without tophus (tophi): Secondary | ICD-10-CM

## 2021-10-03 DIAGNOSIS — G4733 Obstructive sleep apnea (adult) (pediatric): Secondary | ICD-10-CM | POA: Diagnosis not present

## 2021-10-03 DIAGNOSIS — G4700 Insomnia, unspecified: Secondary | ICD-10-CM | POA: Diagnosis not present

## 2021-10-03 DIAGNOSIS — I10 Essential (primary) hypertension: Secondary | ICD-10-CM | POA: Diagnosis not present

## 2021-10-05 DIAGNOSIS — H401132 Primary open-angle glaucoma, bilateral, moderate stage: Secondary | ICD-10-CM | POA: Diagnosis not present

## 2021-10-29 ENCOUNTER — Encounter: Payer: Self-pay | Admitting: Podiatry

## 2021-10-29 ENCOUNTER — Other Ambulatory Visit: Payer: Self-pay

## 2021-10-29 ENCOUNTER — Ambulatory Visit (INDEPENDENT_AMBULATORY_CARE_PROVIDER_SITE_OTHER): Payer: Medicare Other | Admitting: Podiatry

## 2021-10-29 DIAGNOSIS — B351 Tinea unguium: Secondary | ICD-10-CM

## 2021-10-29 DIAGNOSIS — N1831 Chronic kidney disease, stage 3a: Secondary | ICD-10-CM | POA: Diagnosis not present

## 2021-10-29 DIAGNOSIS — E0842 Diabetes mellitus due to underlying condition with diabetic polyneuropathy: Secondary | ICD-10-CM | POA: Diagnosis not present

## 2021-10-29 DIAGNOSIS — M79675 Pain in left toe(s): Secondary | ICD-10-CM

## 2021-10-29 DIAGNOSIS — M79674 Pain in right toe(s): Secondary | ICD-10-CM

## 2021-10-29 DIAGNOSIS — Q828 Other specified congenital malformations of skin: Secondary | ICD-10-CM | POA: Diagnosis not present

## 2021-10-29 NOTE — Progress Notes (Signed)
This patient returns to my office for at risk foot care.  This patient requires this care by a professional since this patient will be at risk due to having diabetes and chronic kidney disease.  This patient is unable to cut nails herself since the patient cannot reach her nails.These nails are painful walking and wearing shoes.  She has a painful callus under her big toe joint right foot.  This is painful walking and wearing her shoes.    This patient presents for at risk foot care today. ? ?General Appearance  Alert, conversant and in no acute stress. ? ?Vascular  Dorsalis pedis and posterior tibial  pulses are weakly  palpable  bilaterally.  Capillary return is within normal limits  bilaterally. Temperature is within normal limits  bilaterally. ? ?Neurologic  Senn-Weinstein monofilament wire test within normal limits/diminished   bilaterally. Muscle power within normal limits bilaterally. ? ?Nails Thick disfigured discolored nails with subungual debris  from hallux to fifth toes bilaterally. No evidence of bacterial infection or drainage bilaterally. ? ?Orthopedic  No limitations of motion  feet .  No crepitus or effusions noted.  No bony pathology or digital deformities noted.  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   ? ?Skin  normotropic skin  noted bilaterally.  No signs of infections or ulcers noted.  Callus sub 1st MPJ right foot.  ? ?Onychomycosis  Pain in right toes  Pain in left toes  Callus sub 1 right foot.   ? ?Consent was obtained for treatment procedures.   Mechanical debridement of nails 1-5  bilaterally performed with a nail nipper.  Filed with dremel without incident.  Debridement of callus with # 15 blade.  Padding applied to diabetic insole. ? ? ?Return office visit  3 months                   Told patient to return for periodic foot care and evaluation due to potential at risk complications. ? ? ?Gardiner Barefoot DPM  ?

## 2021-10-31 DIAGNOSIS — G4733 Obstructive sleep apnea (adult) (pediatric): Secondary | ICD-10-CM | POA: Diagnosis not present

## 2021-10-31 DIAGNOSIS — I10 Essential (primary) hypertension: Secondary | ICD-10-CM | POA: Diagnosis not present

## 2021-10-31 DIAGNOSIS — G4700 Insomnia, unspecified: Secondary | ICD-10-CM | POA: Diagnosis not present

## 2021-11-26 DIAGNOSIS — E1142 Type 2 diabetes mellitus with diabetic polyneuropathy: Secondary | ICD-10-CM | POA: Diagnosis not present

## 2021-11-26 DIAGNOSIS — I10 Essential (primary) hypertension: Secondary | ICD-10-CM | POA: Diagnosis not present

## 2021-11-26 DIAGNOSIS — G4733 Obstructive sleep apnea (adult) (pediatric): Secondary | ICD-10-CM | POA: Diagnosis not present

## 2021-11-30 DIAGNOSIS — Z1231 Encounter for screening mammogram for malignant neoplasm of breast: Secondary | ICD-10-CM | POA: Diagnosis not present

## 2021-12-01 DIAGNOSIS — I10 Essential (primary) hypertension: Secondary | ICD-10-CM | POA: Diagnosis not present

## 2021-12-01 DIAGNOSIS — G4700 Insomnia, unspecified: Secondary | ICD-10-CM | POA: Diagnosis not present

## 2021-12-01 DIAGNOSIS — G4733 Obstructive sleep apnea (adult) (pediatric): Secondary | ICD-10-CM | POA: Diagnosis not present

## 2021-12-21 DIAGNOSIS — E782 Mixed hyperlipidemia: Secondary | ICD-10-CM | POA: Diagnosis not present

## 2021-12-21 DIAGNOSIS — I110 Hypertensive heart disease with heart failure: Secondary | ICD-10-CM | POA: Diagnosis not present

## 2021-12-21 DIAGNOSIS — E039 Hypothyroidism, unspecified: Secondary | ICD-10-CM | POA: Diagnosis not present

## 2021-12-21 DIAGNOSIS — E1142 Type 2 diabetes mellitus with diabetic polyneuropathy: Secondary | ICD-10-CM | POA: Diagnosis not present

## 2021-12-31 DIAGNOSIS — G4700 Insomnia, unspecified: Secondary | ICD-10-CM | POA: Diagnosis not present

## 2021-12-31 DIAGNOSIS — I10 Essential (primary) hypertension: Secondary | ICD-10-CM | POA: Diagnosis not present

## 2021-12-31 DIAGNOSIS — G4733 Obstructive sleep apnea (adult) (pediatric): Secondary | ICD-10-CM | POA: Diagnosis not present

## 2022-01-05 NOTE — Telephone Encounter (Signed)
Prolia VOB initiated via [email or web address] ? ?Last Prolia inj 08/03/21 ?Next Prolia inj due 02/02/22 ? ?U[employer] Medicare ?Prior Auth: APPROVED ?PA# [id number] ?Valid: 07/02/21-07/02/22 ?

## 2022-01-07 DIAGNOSIS — I10 Essential (primary) hypertension: Secondary | ICD-10-CM | POA: Diagnosis not present

## 2022-01-07 DIAGNOSIS — E782 Mixed hyperlipidemia: Secondary | ICD-10-CM | POA: Diagnosis not present

## 2022-01-07 DIAGNOSIS — E1142 Type 2 diabetes mellitus with diabetic polyneuropathy: Secondary | ICD-10-CM | POA: Diagnosis not present

## 2022-01-17 NOTE — Telephone Encounter (Signed)
Prior auth required for PROLIA  PA PROCESS DETAILS: Prior authorization is on file (Authorization # [id number]) and is valid from 08/29/2021 through 08/28/2022. Number of treatments covered No Limit. The prior authorization department can be contacted at [phone].

## 2022-01-19 NOTE — Telephone Encounter (Signed)
Pt ready for scheduling on or after 02/02/22  Out-of-pocket cost due at time of visit: $0  Primary: Medicare Prolia co-insurance: 20% (approximately $276) Admin fee co-insurance: 20% (approximately $25)  Secondary: [employer] Prolia co-insurance: Covers Medicare Part B co-insurance Admin fee co-insurance: Covers Medicare Part B co-insurance  Deductible:  Covered by secondary  Prior Auth: APPROVED PA# [id number] Valid: 07/02/21-07/02/22   ** This summary of benefits is an estimation of the patient's out-of-pocket cost. Exact cost may vary based on individual plan coverage.

## 2022-01-31 DIAGNOSIS — G4700 Insomnia, unspecified: Secondary | ICD-10-CM | POA: Diagnosis not present

## 2022-01-31 DIAGNOSIS — I10 Essential (primary) hypertension: Secondary | ICD-10-CM | POA: Diagnosis not present

## 2022-01-31 DIAGNOSIS — G4733 Obstructive sleep apnea (adult) (pediatric): Secondary | ICD-10-CM | POA: Diagnosis not present

## 2022-02-02 ENCOUNTER — Encounter: Payer: Self-pay | Admitting: Podiatry

## 2022-02-02 ENCOUNTER — Ambulatory Visit (INDEPENDENT_AMBULATORY_CARE_PROVIDER_SITE_OTHER): Payer: Medicare Other | Admitting: Podiatry

## 2022-02-02 DIAGNOSIS — M79674 Pain in right toe(s): Secondary | ICD-10-CM | POA: Diagnosis not present

## 2022-02-02 DIAGNOSIS — Q828 Other specified congenital malformations of skin: Secondary | ICD-10-CM | POA: Diagnosis not present

## 2022-02-02 DIAGNOSIS — N1831 Chronic kidney disease, stage 3a: Secondary | ICD-10-CM | POA: Diagnosis not present

## 2022-02-02 DIAGNOSIS — B351 Tinea unguium: Secondary | ICD-10-CM | POA: Diagnosis not present

## 2022-02-02 DIAGNOSIS — E0842 Diabetes mellitus due to underlying condition with diabetic polyneuropathy: Secondary | ICD-10-CM

## 2022-02-02 DIAGNOSIS — M79675 Pain in left toe(s): Secondary | ICD-10-CM

## 2022-02-02 NOTE — Progress Notes (Signed)
This patient returns to my office for at risk foot care.  This patient requires this care by a professional since this patient will be at risk due to having diabetes and chronic kidney disease.  This patient is unable to cut nails herself since the patient cannot reach her nails.These nails are painful walking and wearing shoes.  She has a painful callus under her big toe joint right foot.  This is painful walking and wearing her shoes.    This patient presents for at risk foot care today.  General Appearance  Alert, conversant and in no acute stress.  Vascular  Dorsalis pedis and posterior tibial  pulses are weakly  palpable  bilaterally.  Capillary return is within normal limits  bilaterally. Temperature is within normal limits  bilaterally.  Neurologic  Senn-Weinstein monofilament wire test within normal limits/diminished   bilaterally. Muscle power within normal limits bilaterally.  Nails Thick disfigured discolored nails with subungual debris  from hallux to fifth toes bilaterally. No evidence of bacterial infection or drainage bilaterally.  Orthopedic  No limitations of motion  feet .  No crepitus or effusions noted.  No bony pathology or digital deformities noted.  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    Skin  normotropic skin  noted bilaterally.  No signs of infections or ulcers noted.  Callus sub 1st MPJ right foot.   Onychomycosis  Pain in right toes  Pain in left toes  Callus sub 1 right foot.    Consent was obtained for treatment procedures.   Mechanical debridement of nails 1-5  bilaterally performed with a nail nipper.  Filed with dremel without incident.  Debridement of callus with # 15 blade.  Padding applied to diabetic insole.   Return office visit  3 months                   Told patient to return for periodic foot care and evaluation due to potential at risk complications.   Gardiner Barefoot DPM

## 2022-02-25 DIAGNOSIS — E039 Hypothyroidism, unspecified: Secondary | ICD-10-CM | POA: Diagnosis not present

## 2022-02-25 DIAGNOSIS — I10 Essential (primary) hypertension: Secondary | ICD-10-CM | POA: Diagnosis not present

## 2022-02-25 DIAGNOSIS — E1142 Type 2 diabetes mellitus with diabetic polyneuropathy: Secondary | ICD-10-CM | POA: Diagnosis not present

## 2022-02-25 DIAGNOSIS — G4733 Obstructive sleep apnea (adult) (pediatric): Secondary | ICD-10-CM | POA: Diagnosis not present

## 2022-03-02 DIAGNOSIS — G4700 Insomnia, unspecified: Secondary | ICD-10-CM | POA: Diagnosis not present

## 2022-03-02 DIAGNOSIS — G4733 Obstructive sleep apnea (adult) (pediatric): Secondary | ICD-10-CM | POA: Diagnosis not present

## 2022-03-02 DIAGNOSIS — I10 Essential (primary) hypertension: Secondary | ICD-10-CM | POA: Diagnosis not present

## 2022-03-22 ENCOUNTER — Ambulatory Visit (INDEPENDENT_AMBULATORY_CARE_PROVIDER_SITE_OTHER): Payer: Medicare Other

## 2022-03-22 DIAGNOSIS — M810 Age-related osteoporosis without current pathological fracture: Secondary | ICD-10-CM

## 2022-03-22 MED ORDER — DENOSUMAB 60 MG/ML ~~LOC~~ SOSY
60.0000 mg | PREFILLED_SYRINGE | Freq: Once | SUBCUTANEOUS | Status: AC
  Administered 2022-03-22: 60 mg via SUBCUTANEOUS

## 2022-03-22 NOTE — Progress Notes (Signed)
After obtaining consent, and per orders of Dr. Kelton Pillar, injection of Prolia '60mg'$  given by Takayla Baillie L Marilyn Nihiser. Patient instructed to remain in clinic for 20 minutes afterwards, and to report any adverse reaction to me immediately.

## 2022-04-02 DIAGNOSIS — G4700 Insomnia, unspecified: Secondary | ICD-10-CM | POA: Diagnosis not present

## 2022-04-02 DIAGNOSIS — I10 Essential (primary) hypertension: Secondary | ICD-10-CM | POA: Diagnosis not present

## 2022-04-02 DIAGNOSIS — G4733 Obstructive sleep apnea (adult) (pediatric): Secondary | ICD-10-CM | POA: Diagnosis not present

## 2022-04-02 NOTE — Telephone Encounter (Signed)
Last Prolia inj 03/22/22 Next Prolia inj due 09/23/22  Prior Auth: APPROVED PA# [id number] Valid: 07/02/21-07/02/22

## 2022-04-06 DIAGNOSIS — H353131 Nonexudative age-related macular degeneration, bilateral, early dry stage: Secondary | ICD-10-CM | POA: Diagnosis not present

## 2022-04-06 DIAGNOSIS — H401132 Primary open-angle glaucoma, bilateral, moderate stage: Secondary | ICD-10-CM | POA: Diagnosis not present

## 2022-04-06 DIAGNOSIS — E119 Type 2 diabetes mellitus without complications: Secondary | ICD-10-CM | POA: Diagnosis not present

## 2022-04-06 DIAGNOSIS — H5203 Hypermetropia, bilateral: Secondary | ICD-10-CM | POA: Diagnosis not present

## 2022-04-27 ENCOUNTER — Ambulatory Visit: Payer: Self-pay

## 2022-04-27 NOTE — Patient Outreach (Signed)
  Care Coordination   04/27/2022 Name: Megan [name] MRN: [id number] DOB: Jul 24, 1946   Care Coordination Outreach Attempts:  An unsuccessful telephone outreach was attempted today to offer the patient information about available care coordination services as a benefit of their health plan.   Follow Up Plan:  Additional outreach attempts will be made to offer the patient care coordination information and services.   Encounter Outcome:  No Answer  Care Coordination Interventions Activated:  No   Care Coordination Interventions:  No, not indicated    [facility] [phone]

## 2022-05-03 DIAGNOSIS — G4733 Obstructive sleep apnea (adult) (pediatric): Secondary | ICD-10-CM | POA: Diagnosis not present

## 2022-05-03 DIAGNOSIS — I10 Essential (primary) hypertension: Secondary | ICD-10-CM | POA: Diagnosis not present

## 2022-05-03 DIAGNOSIS — G4700 Insomnia, unspecified: Secondary | ICD-10-CM | POA: Diagnosis not present

## 2022-05-11 ENCOUNTER — Encounter: Payer: Self-pay | Admitting: Podiatry

## 2022-05-11 ENCOUNTER — Ambulatory Visit (INDEPENDENT_AMBULATORY_CARE_PROVIDER_SITE_OTHER): Payer: Medicare Other | Admitting: Podiatry

## 2022-05-11 DIAGNOSIS — Q828 Other specified congenital malformations of skin: Secondary | ICD-10-CM | POA: Diagnosis not present

## 2022-05-11 DIAGNOSIS — B351 Tinea unguium: Secondary | ICD-10-CM | POA: Diagnosis not present

## 2022-05-11 DIAGNOSIS — M79675 Pain in left toe(s): Secondary | ICD-10-CM | POA: Diagnosis not present

## 2022-05-11 DIAGNOSIS — N1831 Chronic kidney disease, stage 3a: Secondary | ICD-10-CM

## 2022-05-11 DIAGNOSIS — E0842 Diabetes mellitus due to underlying condition with diabetic polyneuropathy: Secondary | ICD-10-CM | POA: Diagnosis not present

## 2022-05-11 DIAGNOSIS — M79674 Pain in right toe(s): Secondary | ICD-10-CM

## 2022-05-11 NOTE — Progress Notes (Signed)
This patient returns to my office for at risk foot care.  This patient requires this care by a professional since this patient will be at risk due to having diabetes and chronic kidney disease.  This patient is unable to cut nails herself since the patient cannot reach her nails.These nails are painful walking and wearing shoes.  She has a painful callus under her big toe joint right foot.  This is painful walking and wearing her shoes.    This patient presents for at risk foot care today.  General Appearance  Alert, conversant and in no acute stress.  Vascular  Dorsalis pedis and posterior tibial  pulses are weakly  palpable  bilaterally.  Capillary return is within normal limits  bilaterally. Temperature is within normal limits  bilaterally.  Neurologic  Senn-Weinstein monofilament wire test within normal limits/diminished   bilaterally. Muscle power within normal limits bilaterally.  Nails Thick disfigured discolored nails with subungual debris  from hallux to fifth toes bilaterally. No evidence of bacterial infection or drainage bilaterally.  Orthopedic  No limitations of motion  feet .  No crepitus or effusions noted.  No bony pathology or digital deformities noted.  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    Skin  normotropic skin  noted bilaterally.  No signs of infections or ulcers noted.  Callus sub 1st MPJ right foot.   Onychomycosis  Pain in right toes  Pain in left toes  Callus sub 1 right foot.    Consent was obtained for treatment procedures.   Mechanical debridement of nails 1-5  bilaterally performed with a nail nipper.  Filed with dremel without incident.  Debridement of callus with # 15 blade.    Return office visit  3 months                   Told patient to return for periodic foot care and evaluation due to potential at risk complications.   Gardiner Barefoot DPM

## 2022-05-19 ENCOUNTER — Telehealth: Payer: Self-pay

## 2022-05-19 NOTE — Patient Outreach (Signed)
  Care Coordination   05/19/2022 Name: Megan [name] MRN: [id number] DOB: [birth year]   Care Coordination Outreach Attempts:  A second unsuccessful outreach was attempted today to offer the patient with information about available care coordination services as a benefit of their health plan.     Follow Up Plan:  Additional outreach attempts will be made to offer the patient care coordination information and services.   Encounter Outcome:  No Answer  Care Coordination Interventions Activated:  No   Care Coordination Interventions:  No, not indicated    [facility] [phone]

## 2022-05-25 ENCOUNTER — Other Ambulatory Visit: Payer: Self-pay

## 2022-05-25 DIAGNOSIS — D649 Anemia, unspecified: Secondary | ICD-10-CM

## 2022-05-27 ENCOUNTER — Inpatient Hospital Stay: Payer: Medicare Other | Attending: Hematology

## 2022-05-27 ENCOUNTER — Inpatient Hospital Stay (HOSPITAL_BASED_OUTPATIENT_CLINIC_OR_DEPARTMENT_OTHER): Payer: Medicare Other | Admitting: Hematology

## 2022-05-27 VITALS — BP 135/70 | HR 91 | Temp 99.1°F | Resp 18 | Ht 62.0 in | Wt 228.9 lb

## 2022-05-27 DIAGNOSIS — I10 Essential (primary) hypertension: Secondary | ICD-10-CM | POA: Insufficient documentation

## 2022-05-27 DIAGNOSIS — Z803 Family history of malignant neoplasm of breast: Secondary | ICD-10-CM | POA: Diagnosis not present

## 2022-05-27 DIAGNOSIS — D649 Anemia, unspecified: Secondary | ICD-10-CM | POA: Diagnosis not present

## 2022-05-27 DIAGNOSIS — Z87891 Personal history of nicotine dependence: Secondary | ICD-10-CM | POA: Insufficient documentation

## 2022-05-27 DIAGNOSIS — E1136 Type 2 diabetes mellitus with diabetic cataract: Secondary | ICD-10-CM | POA: Insufficient documentation

## 2022-05-27 DIAGNOSIS — D638 Anemia in other chronic diseases classified elsewhere: Secondary | ICD-10-CM | POA: Diagnosis not present

## 2022-05-27 DIAGNOSIS — I878 Other specified disorders of veins: Secondary | ICD-10-CM | POA: Diagnosis not present

## 2022-05-27 DIAGNOSIS — Z808 Family history of malignant neoplasm of other organs or systems: Secondary | ICD-10-CM | POA: Insufficient documentation

## 2022-05-27 DIAGNOSIS — D509 Iron deficiency anemia, unspecified: Secondary | ICD-10-CM

## 2022-05-27 LAB — CBC WITH DIFFERENTIAL (CANCER CENTER ONLY)
Abs Immature Granulocytes: 0.02 10*3/uL (ref 0.00–0.07)
Basophils Absolute: 0.1 10*3/uL (ref 0.0–0.1)
Basophils Relative: 1 %
Eosinophils Absolute: 0.2 10*3/uL (ref 0.0–0.5)
Eosinophils Relative: 2 %
HCT: 32.3 % — ABNORMAL LOW (ref 36.0–46.0)
Hemoglobin: 10 g/dL — ABNORMAL LOW (ref 12.0–15.0)
Immature Granulocytes: 0 %
Lymphocytes Relative: 15 %
Lymphs Abs: 1.7 10*3/uL (ref 0.7–4.0)
MCH: 25.6 pg — ABNORMAL LOW (ref 26.0–34.0)
MCHC: 31 g/dL (ref 30.0–36.0)
MCV: 82.6 fL (ref 80.0–100.0)
Monocytes Absolute: 0.6 10*3/uL (ref 0.1–1.0)
Monocytes Relative: 5 %
Neutro Abs: 8.3 10*3/uL — ABNORMAL HIGH (ref 1.7–7.7)
Neutrophils Relative %: 77 %
Platelet Count: 291 10*3/uL (ref 150–400)
RBC: 3.91 MIL/uL (ref 3.87–5.11)
RDW: 15.3 % (ref 11.5–15.5)
WBC Count: 10.8 10*3/uL — ABNORMAL HIGH (ref 4.0–10.5)
nRBC: 0 % (ref 0.0–0.2)

## 2022-05-27 LAB — CMP (CANCER CENTER ONLY)
ALT: 29 U/L (ref 0–44)
AST: 21 U/L (ref 15–41)
Albumin: 3.9 g/dL (ref 3.5–5.0)
Alkaline Phosphatase: 124 U/L (ref 38–126)
Anion gap: 8 (ref 5–15)
BUN: 15 mg/dL (ref 8–23)
CO2: 25 mmol/L (ref 22–32)
Calcium: 7.2 mg/dL — ABNORMAL LOW (ref 8.9–10.3)
Chloride: 107 mmol/L (ref 98–111)
Creatinine: 0.86 mg/dL (ref 0.44–1.00)
GFR, Estimated: >60 mL/min (ref >60)
Glucose, Bld: 168 mg/dL — ABNORMAL HIGH (ref 70–99)
Potassium: 4.1 mmol/L (ref 3.5–5.1)
Sodium: 140 mmol/L (ref 135–145)
Total Bilirubin: 0.3 mg/dL (ref 0.3–1.2)
Total Protein: 7 g/dL (ref 6.5–8.1)

## 2022-05-27 LAB — IRON AND IRON BINDING CAPACITY (CC-WL,HP ONLY)
Iron: 18 ug/dL — ABNORMAL LOW (ref 28–170)
Saturation Ratios: 5 % — ABNORMAL LOW (ref 10.4–31.8)
TIBC: 398 ug/dL (ref 250–450)
UIBC: 380 ug/dL (ref 148–442)

## 2022-05-27 LAB — VITAMIN B12: Vitamin B-12: 1798 pg/mL — ABNORMAL HIGH (ref 180–914)

## 2022-05-27 LAB — FERRITIN: Ferritin: 7 ng/mL — ABNORMAL LOW (ref 11–307)

## 2022-05-31 DIAGNOSIS — G4700 Insomnia, unspecified: Secondary | ICD-10-CM | POA: Diagnosis not present

## 2022-05-31 DIAGNOSIS — I10 Essential (primary) hypertension: Secondary | ICD-10-CM | POA: Diagnosis not present

## 2022-05-31 DIAGNOSIS — G4733 Obstructive sleep apnea (adult) (pediatric): Secondary | ICD-10-CM | POA: Diagnosis not present

## 2022-06-02 ENCOUNTER — Encounter: Payer: Self-pay | Admitting: Hematology

## 2022-06-02 DIAGNOSIS — G4733 Obstructive sleep apnea (adult) (pediatric): Secondary | ICD-10-CM | POA: Diagnosis not present

## 2022-06-02 DIAGNOSIS — I10 Essential (primary) hypertension: Secondary | ICD-10-CM | POA: Diagnosis not present

## 2022-06-02 DIAGNOSIS — G4700 Insomnia, unspecified: Secondary | ICD-10-CM | POA: Diagnosis not present

## 2022-06-02 NOTE — Progress Notes (Signed)
[name]    HEMATOLOGY/ONCOLOGY CLINIC NOTE  Date of Service: .05/27/2022   Patient Care Team: [name], MD as PCP - General (Family Medicine) Nahser, Wonda Cheng, MD as PCP - Cardiology (Cardiology)  CHIEF COMPLAINTS/PURPOSE OF CONSULTATION:  Follow-up for continued evaluation and management of normocytic anemia. The HISTORY OF PRESENTING ILLNESS:  Megan [name] is a wonderful 76 y.o. female who has been referred to [name] by Dr .[name], MD  for evaluation and management of normocytic anemia.  Patient has a h/o inflammatory arthritis, hypothyroidism, iron deficiency, HTN, DM2, glaucoma, asthma . Patient had recent labs with her PCP on 08/13/2018 which showed WBC 7.9k, HCT 30.4, MCV 86, platelets of 349k. Nl FT4 levels. Ferritin 32.  Interval History:   Megan [name] is here for continued evaluation and management of iron deficiency/multifactorial anemia. Patient notes some increased fatigue.  No overt GI bleeding noted.  Labs done today reviewed with the patient in detail Hemoglobin levels have dropped to 10 from 12 previous level of 12.9.  Normal platelet counts of 291k Noted to have significant iron deficiency with a ferritin of 7 and an iron saturation of 5%.  MEDICAL HISTORY:  Past Medical History:  Diagnosis Date   Allergy    bee stings   Anxiety    Asthmatic bronchitis    Broken arm 1957/2008   right   Cataract    had surgery    Depression    Diabetes mellitus without complication ([place])    prediabetes diet controlled   Diverticulosis    GERD (gastroesophageal reflux disease)    Glaucoma    BIL   Hemorrhoids    Hx of adenomatous colonic polyps 12/02/2010   Hyperlipidemia    Hypertension    Hypothyroidism 05/2018   Inflammatory arthritis    Knee bursitis    Migraines    Obesity    Osteoarthritis    Sleep apnea     SURGICAL HISTORY: Past Surgical History:  Procedure Laterality Date   CARPAL TUNNEL RELEASE Left    left wrist   CATARACT  EXTRACTION W/ INTRAOCULAR LENS  IMPLANT, BILATERAL Bilateral    1999 left, 2008 right   COLONOSCOPY     CORONARY ARTERY BYPASS GRAFT N/A 05/15/2019   Procedure: CORONARY ARTERY BYPASS GRAFTING (CABG) times two, left internal mammary artery to left anterior descending artery and left greater saphenous vein to posterior descending artery, left sapheouns vein harvested endoscopically ;  Surgeon: Grace Isaac, MD;  Location: [facility];  Service: Open Heart Surgery;  Laterality: N/A;   EXCISION MORTON'S NEUROMA Left 1978   left foot   GLAUCOMA SURGERY Bilateral    and laser surgery, 2004 left, 2008 right   LEFT HEART CATH AND CORONARY ANGIOGRAPHY N/A 05/13/2019   Procedure: LEFT HEART CATH AND CORONARY ANGIOGRAPHY;  Surgeon: Martinique, Peter M, MD;  Location: [facility];  Service: Cardiovascular;  Laterality: N/A;   ORIF RADIAL FRACTURE Left 12/13/2019   Procedure: OPEN REDUCTION INTERNAL FIXATION (ORIF) FOREARM FRACTURE;  Surgeon: Iran Planas, MD;  Location: [facility];  Service: Orthopedics;  Laterality: Left;   PARTIAL HYSTERECTOMY  1991   Left an ovary   POLYPECTOMY     TEE WITHOUT CARDIOVERSION N/A 05/15/2019   Procedure: TRANSESOPHAGEAL ECHOCARDIOGRAM (TEE);  Surgeon: Grace Isaac, MD;  Location: [place];  Service: Open Heart Surgery;  Laterality: N/A;   TOENAIL AVULSION Right    big toe    SOCIAL HISTORY: Social History   Socioeconomic History  Marital status: Widowed    Spouse name: Lynnae Sandhoff   Number of children: 0   Years of education: Not on file   Highest education level: Associate degree: occupational, Hotel manager, or vocational program  Occupational History   Occupation: retired  Tobacco Use   Smoking status: Former    Types: Cigarettes    Quit date: 08/30/1971    Years since quitting: 50.7   Smokeless tobacco: Never  Vaping Use   Vaping Use: Never used  Substance and Sexual Activity   Alcohol use: No    Alcohol/week: 0.0 standard drinks of alcohol   Drug use: No    Sexual activity: Not on file  Other Topics Concern   Not on file  Social History Narrative   She is widowed, no children, retired.  Lives in a one story home.  Retired from Geologist, engineering for [employer].   Social Determinants of Radio broadcast assistant Strain: Not on file  Food Insecurity: Not on file  Transportation Needs: Not on file  Physical Activity: Not on file  Stress: Not on file  Social Connections: Not on file  Intimate Partner Violence: Not on file    FAMILY HISTORY: Family History  Problem Relation Age of Onset   Colon polyps Maternal Grandfather    Bone cancer Maternal Grandfather    Hypertension Mother    Diabetes Mother    Parkinson's disease Mother    Hypertension Father    Diabetes Father    Heart Problems Father    Asthma Brother    Diabetes Brother    Breast cancer Brother        stage 4, both breast   Obesity Other    Diabetes Sister        x 2   COPD Sister    Asthma Sister    Clotting disorder Sister        blood count   Rectal cancer Neg Hx    Stomach cancer Neg Hx    Esophageal cancer Neg Hx     ALLERGIES:  is allergic to aspirin, bee pollen, codeine, fish oil, sulfa antibiotics, hornet venom, iron, lorazepam, pantoprazole, tape, tramadol hcl, camphor, lisinopril, penicillins, sulfasalazine, and vicodin [hydrocodone-acetaminophen].  MEDICATIONS:  Current Outpatient Medications  Medication Sig Dispense Refill   acetaminophen (TYLENOL) 650 MG CR tablet Take 650 mg by mouth every 8 (eight) hours.     allopurinol (ZYLOPRIM) 100 MG tablet Take 1 tablet (100 mg total) by mouth daily. 30 tablet 0   atorvastatin (LIPITOR) 80 MG tablet Take 1 tablet (80 mg total) by mouth daily at 6 PM. 30 tablet 2   calcium carbonate (TUMS - DOSED IN MG ELEMENTAL CALCIUM) 500 MG chewable tablet Chew 1-2 tablets by mouth as needed for indigestion or heartburn.     clopidogrel (PLAVIX) 75 MG tablet Take 1 tablet (75 mg total) by mouth daily. 30 tablet 2   CVS B6 100  MG tablet Take 1 tablet by mouth at bedtime.     EPINEPHrine 0.3 mg/0.3 mL IJ SOAJ injection Inject 0.3 mg into the muscle once as needed for anaphylaxis.      famotidine (PEPCID) 20 MG tablet Take 20 mg by mouth 2 (two) times daily.     furosemide (LASIX) 40 MG tablet Take 1 tablet (40 mg total) by mouth 2 (two) times daily. 90 tablet 3   levothyroxine (SYNTHROID) 75 MCG tablet Take 1 tablet (75 mcg total) by mouth daily before breakfast. 90 tablet 3   loperamide (  IMODIUM A-D) 2 MG tablet 1 tablet as needed     LORazepam (ATIVAN) 0.5 MG tablet Take 0.5 mg by mouth every 8 (eight) hours.     losartan (COZAAR) 25 MG tablet Take 0.5 tablets (12.5 mg total) by mouth daily. 45 tablet 3   Magnesium Oxide 400 MG CAPS Take 1 capsule (400 mg total) by mouth daily. 90 capsule 3   meclizine (ANTIVERT) 25 MG tablet Take 25 mg by mouth 3 (three) times daily as needed for dizziness.      metFORMIN (GLUCOPHAGE) 500 MG tablet Take 1 tablet by mouth 2 (two) times daily with a meal.     metoprolol succinate (TOPROL-XL) 25 MG 24 hr tablet Take 1 tablet by mouth daily.     metoprolol tartrate (LOPRESSOR) 25 MG tablet Take 0.5 tablets (12.5 mg total) by mouth 2 (two) times daily. 90 tablet 3   ONETOUCH VERIO test strip USE TO TEST BLOOD GLUCOSE ONCE DAILY 100 each 0   pantoprazole (PROTONIX) 20 MG tablet Take 20 mg by mouth daily.     polyvinyl alcohol (LIQUIFILM TEARS) 1.4 % ophthalmic solution Place 1 drop into both eyes every 4 (four) hours as needed for dry eyes. 15 mL 0   potassium chloride SA (KLOR-CON) 20 MEQ tablet Take 1 tablet by mouth once a week. On Wednesday     predniSONE (DELTASONE) 10 MG tablet Take 10 mg by mouth as needed.     spironolactone (ALDACTONE) 25 MG tablet Take 12.5 mg by mouth at bedtime.      traMADol (ULTRAM) 50 MG tablet Take 50 mg by mouth 4 (four) times daily as needed.     vitamin B-12 (CYANOCOBALAMIN) 1000 MCG tablet 1 tab by orally daily 30 tablet 0   Vitamin D, Ergocalciferol,  (DRISDOL) 1.25 MG (50000 UNIT) CAPS capsule Take 50,000 Units by mouth every Thursday.     diclofenac sodium (VOLTAREN) 1 % GEL Apply 2 gm topically to left knee 100 g 0   empagliflozin (JARDIANCE) 10 MG TABS tablet Take 10 mg by mouth daily.     Melatonin 3 MG TABS Take 1 tablet (3 mg total) by mouth at bedtime. FOR INSOMNIA 30 tablet 0   ondansetron (ZOFRAN) 4 MG tablet Take 1 tablet (4 mg total) by mouth every 6 (six) hours as needed for nausea or vomiting. GIVE 1 TABLET BY MOUTH EVERY 6 HOURS AS NEEDED FOR NAUSEA/VOMITING (Patient not taking: Reported on 05/27/2022) 20 tablet 0   ondansetron (ZOFRAN-ODT) 4 MG disintegrating tablet 1 tablet on the tongue and allow to dissolve (Patient not taking: Reported on 05/27/2022)     No current facility-administered medications for this visit.    REVIEW OF SYSTEMS:   10 Point review of Systems was done is negative except as noted above.  PHYSICAL EXAMINATION: ECOG PERFORMANCE STATUS: 2 - Symptomatic, <50% confined to bed  Vitals:   05/27/22 1003  BP: 135/70  Pulse: 91  Resp: 18  Temp: 99.1 F (37.3 C)  SpO2: 98%   Filed Weights   05/27/22 1003  Weight: 228 lb 14.4 oz (103.8 kg)   .Body mass index is 41.87 kg/m. NAD GENERAL:alert, in no acute distress and comfortable SKIN: no acute rashes, no significant lesions EYES: conjunctiva are pink and non-injected, sclera anicteric OROPHARYNX: MMM, no exudates, no oropharyngeal erythema or ulceration NECK: supple, no JVD LYMPH:  no palpable lymphadenopathy in the cervical, axillary or inguinal regions LUNGS: clear to auscultation b/l with normal respiratory effort HEART: regular rate &  rhythm ABDOMEN:  normoactive bowel sounds , non tender, not distended. Extremity: no pedal edema PSYCH: alert & oriented x 3 with fluent speech NEURO: no focal motor/sensory deficits   LABORATORY DATA:  I have reviewed the data as listed  .    Latest Ref Rng & Units 05/27/2022    9:47 AM 05/28/2021    10:35 AM 09/25/2020   11:04 AM  CBC  WBC 4.0 - 10.5 K/uL 10.8  10.1  9.9   Hemoglobin 12.0 - 15.0 g/dL 10.0  12.9  13.0   Hematocrit 36.0 - 46.0 % 32.3  38.7  40.1   Platelets 150 - 400 K/uL 291  236  262    .    Latest Ref Rng & Units 05/27/2022    9:47 AM 05/28/2021   10:35 AM 01/01/2021   10:34 AM  CMP  Glucose 70 - 99 mg/dL 168  144  127   BUN 8 - 23 mg/dL '15  29  29   '$ Creatinine 0.44 - 1.00 mg/dL 0.86  0.96  0.99   Sodium 135 - 145 mmol/L 140  142  141   Potassium 3.5 - 5.1 mmol/L 4.1  3.9  4.0   Chloride 98 - 111 mmol/L 107  103  103   CO2 22 - 32 mmol/L '25  26  26   '$ Calcium 8.9 - 10.3 mg/dL 7.2  8.6  8.9   Total Protein 6.5 - 8.1 g/dL 7.0  7.3  7.3   Total Bilirubin 0.3 - 1.2 mg/dL 0.3  0.3  0.3   Alkaline Phos 38 - 126 U/L 124  135  132   AST 15 - 41 U/L '21  18  17   '$ ALT 0 - 44 U/L '29  20  18    '$ . Lab Results  Component Value Date   IRON 18 (L) 05/27/2022   TIBC 398 05/27/2022   IRONPCTSAT 5 (L) 05/27/2022   (Iron and TIBC)  Lab Results  Component Value Date   FERRITIN 7 (L) 05/27/2022   B12 - 2096  RADIOGRAPHIC STUDIES: I have personally reviewed the radiological images as listed and agreed with the findings in the report. No results found.  ASSESSMENT & PLAN:   76 y.o. female with   1) Normocytic Anemia Likely multifactorial.  But primarily seems to be be from iron deficiency at this time. Primarily anemia of chronic disease from b/l leg swelling and chronic venous stasis dermatitis. - patient recommended to f/u with PCP to optimize mx of her pedal edema and venous stasis dermatitis and wound care considerations. Some iron deficiency anemia Normal LDH - no evidence of hemolysis 09/04/18 Sed rate at 66 myeloma panel - no evidence of M spike  PLAN: -Labs done today were discussed in detail with the patient. Patient's anemia seems to have worsened and hemoglobin is up to 10 from a previous level of nearly 13. Patient noted to be significantly anemic with  a ferritin of 7 and iron saturation of 5%. -B12 levels are adequate -We will pursue IV Injectafer again 750 mg weekly x2 doses -Continue SL 1031mg Vitamin B12  5 days a week -Follow-up with PCP to consider GI work-up for further evaluation of etiology of iron deficiency. -Iron deficiency could be from her chronic PPI use.   FOLLOW UP: note: -IV Injectafer weekly x2 doses starting in about 1 month (per patient's preference, has previous IV iron allergy so cannot use Venofer or Ferrlecit) -Return to clinic with Dr. KIrene Limbo  with labs in 6 months    The total time spent in the appointment was 21 minutes*.  All of the patient's questions were answered with apparent satisfaction. The patient knows to call the clinic with any problems, questions or concerns.   Sullivan Lone MD MS AAHIVMS [facility] Hematology/Oncology Physician [facility]  .*Total Encounter Time as defined by the Centers for Medicare and Medicaid Services includes, in addition to the face-to-face time of a patient visit (documented in the note above) non-face-to-face time: obtaining and reviewing outside history, ordering and reviewing medications, tests or procedures, care coordination (communications with other health care professionals or caregivers) and documentation in the medical record.

## 2022-06-09 ENCOUNTER — Other Ambulatory Visit: Payer: Self-pay | Admitting: Hematology

## 2022-06-13 DIAGNOSIS — E1142 Type 2 diabetes mellitus with diabetic polyneuropathy: Secondary | ICD-10-CM | POA: Diagnosis not present

## 2022-06-13 DIAGNOSIS — G4733 Obstructive sleep apnea (adult) (pediatric): Secondary | ICD-10-CM | POA: Diagnosis not present

## 2022-06-13 DIAGNOSIS — I10 Essential (primary) hypertension: Secondary | ICD-10-CM | POA: Diagnosis not present

## 2022-06-13 DIAGNOSIS — E039 Hypothyroidism, unspecified: Secondary | ICD-10-CM | POA: Diagnosis not present

## 2022-06-15 ENCOUNTER — Telehealth: Payer: Self-pay

## 2022-06-15 NOTE — Patient Outreach (Signed)
  Care Coordination   Initial Visit Note   06/15/2022 Name: Megan [name] MRN: [id number] DOB: October 18, 1945  Megan [name] is a 76 y.o. year old female who sees Megan Neer, MD for primary care. I spoke with  Megan [name] by phone today.  What matters to the patients health and wellness today?  No Concerns Expressed    Goals Addressed   None     SDOH assessments and interventions completed:  No     Care Coordination Interventions Activated:  No  Care Coordination Interventions:  No, not indicated   Follow up plan: No further intervention required.   Encounter Outcome:  Pt. [name] [facility] [phone]

## 2022-06-23 ENCOUNTER — Other Ambulatory Visit: Payer: Self-pay | Admitting: Hematology

## 2022-06-24 ENCOUNTER — Inpatient Hospital Stay: Payer: Medicare Other | Attending: Hematology

## 2022-06-24 ENCOUNTER — Inpatient Hospital Stay: Payer: Medicare Other

## 2022-06-24 VITALS — BP 128/51 | HR 74 | Temp 98.2°F | Resp 17

## 2022-06-24 DIAGNOSIS — D509 Iron deficiency anemia, unspecified: Secondary | ICD-10-CM | POA: Diagnosis not present

## 2022-06-24 MED ORDER — SODIUM CHLORIDE 0.9 % IV SOLN: Freq: Once | INTRAVENOUS | Status: AC

## 2022-06-24 MED ORDER — LORATADINE 10 MG PO TABS
10.0000 mg | ORAL_TABLET | Freq: Once | ORAL | Status: AC
  Administered 2022-06-24: 10 mg via ORAL
  Filled 2022-06-24: qty 1

## 2022-06-24 MED ORDER — SODIUM CHLORIDE 0.9 % IV SOLN
510.0000 mg | Freq: Once | INTRAVENOUS | Status: AC
  Administered 2022-06-24: 510 mg via INTRAVENOUS
  Filled 2022-06-24: qty 17

## 2022-06-24 MED ORDER — METHYLPREDNISOLONE SODIUM SUCC 40 MG IJ SOLR
40.0000 mg | Freq: Once | INTRAMUSCULAR | Status: AC
  Administered 2022-06-24: 40 mg via INTRAVENOUS
  Filled 2022-06-24: qty 1

## 2022-06-24 NOTE — Patient Instructions (Signed)

## 2022-07-01 ENCOUNTER — Inpatient Hospital Stay: Payer: Medicare Other | Attending: Hematology

## 2022-07-01 ENCOUNTER — Inpatient Hospital Stay: Payer: Medicare Other

## 2022-07-01 VITALS — BP 133/64 | HR 80 | Temp 98.7°F | Resp 16

## 2022-07-01 DIAGNOSIS — Z79899 Other long term (current) drug therapy: Secondary | ICD-10-CM | POA: Diagnosis not present

## 2022-07-01 DIAGNOSIS — D509 Iron deficiency anemia, unspecified: Secondary | ICD-10-CM | POA: Diagnosis not present

## 2022-07-01 MED ORDER — SODIUM CHLORIDE 0.9 % IV SOLN
510.0000 mg | Freq: Once | INTRAVENOUS | Status: AC
  Administered 2022-07-01: 510 mg via INTRAVENOUS
  Filled 2022-07-01: qty 510

## 2022-07-01 MED ORDER — LORATADINE 10 MG PO TABS
10.0000 mg | ORAL_TABLET | Freq: Once | ORAL | Status: AC
  Administered 2022-07-01: 10 mg via ORAL
  Filled 2022-07-01: qty 1

## 2022-07-01 MED ORDER — SODIUM CHLORIDE 0.9 % IV SOLN: Freq: Once | INTRAVENOUS | Status: AC

## 2022-07-01 MED ORDER — METHYLPREDNISOLONE SODIUM SUCC 40 MG IJ SOLR
40.0000 mg | Freq: Once | INTRAMUSCULAR | Status: AC
  Administered 2022-07-01: 40 mg via INTRAVENOUS
  Filled 2022-07-01: qty 1

## 2022-07-01 NOTE — Patient Instructions (Signed)

## 2022-07-01 NOTE — Progress Notes (Signed)
Patient monitored for 30 minutes post feraheme infusion. Patient tolerated tx without incident, VSS, and ambulatory with cane.

## 2022-07-07 ENCOUNTER — Ambulatory Visit: Payer: Medicare Other | Admitting: Internal Medicine

## 2022-07-07 DIAGNOSIS — E039 Hypothyroidism, unspecified: Secondary | ICD-10-CM | POA: Diagnosis not present

## 2022-07-07 DIAGNOSIS — Z23 Encounter for immunization: Secondary | ICD-10-CM | POA: Diagnosis not present

## 2022-07-07 DIAGNOSIS — G4700 Insomnia, unspecified: Secondary | ICD-10-CM | POA: Diagnosis not present

## 2022-07-07 DIAGNOSIS — E1142 Type 2 diabetes mellitus with diabetic polyneuropathy: Secondary | ICD-10-CM | POA: Diagnosis not present

## 2022-07-07 DIAGNOSIS — I110 Hypertensive heart disease with heart failure: Secondary | ICD-10-CM | POA: Diagnosis not present

## 2022-07-07 DIAGNOSIS — Z0000 Encounter for general adult medical examination without abnormal findings: Secondary | ICD-10-CM | POA: Diagnosis not present

## 2022-07-07 DIAGNOSIS — I2510 Atherosclerotic heart disease of native coronary artery without angina pectoris: Secondary | ICD-10-CM | POA: Diagnosis not present

## 2022-07-07 DIAGNOSIS — D509 Iron deficiency anemia, unspecified: Secondary | ICD-10-CM | POA: Diagnosis not present

## 2022-07-07 DIAGNOSIS — G4733 Obstructive sleep apnea (adult) (pediatric): Secondary | ICD-10-CM | POA: Diagnosis not present

## 2022-07-07 DIAGNOSIS — I509 Heart failure, unspecified: Secondary | ICD-10-CM | POA: Diagnosis not present

## 2022-07-07 DIAGNOSIS — E782 Mixed hyperlipidemia: Secondary | ICD-10-CM | POA: Diagnosis not present

## 2022-07-07 DIAGNOSIS — M159 Polyosteoarthritis, unspecified: Secondary | ICD-10-CM | POA: Diagnosis not present

## 2022-07-17 DIAGNOSIS — M159 Polyosteoarthritis, unspecified: Secondary | ICD-10-CM | POA: Diagnosis not present

## 2022-07-17 DIAGNOSIS — E782 Mixed hyperlipidemia: Secondary | ICD-10-CM | POA: Diagnosis not present

## 2022-07-17 DIAGNOSIS — I110 Hypertensive heart disease with heart failure: Secondary | ICD-10-CM | POA: Diagnosis not present

## 2022-07-17 DIAGNOSIS — D509 Iron deficiency anemia, unspecified: Secondary | ICD-10-CM | POA: Diagnosis not present

## 2022-07-17 DIAGNOSIS — Z9181 History of falling: Secondary | ICD-10-CM | POA: Diagnosis not present

## 2022-07-17 DIAGNOSIS — M25551 Pain in right hip: Secondary | ICD-10-CM | POA: Diagnosis not present

## 2022-07-17 DIAGNOSIS — H409 Unspecified glaucoma: Secondary | ICD-10-CM | POA: Diagnosis not present

## 2022-07-17 DIAGNOSIS — K219 Gastro-esophageal reflux disease without esophagitis: Secondary | ICD-10-CM | POA: Diagnosis not present

## 2022-07-17 DIAGNOSIS — H04129 Dry eye syndrome of unspecified lacrimal gland: Secondary | ICD-10-CM | POA: Diagnosis not present

## 2022-07-17 DIAGNOSIS — K5790 Diverticulosis of intestine, part unspecified, without perforation or abscess without bleeding: Secondary | ICD-10-CM | POA: Diagnosis not present

## 2022-07-17 DIAGNOSIS — E039 Hypothyroidism, unspecified: Secondary | ICD-10-CM | POA: Diagnosis not present

## 2022-07-17 DIAGNOSIS — K449 Diaphragmatic hernia without obstruction or gangrene: Secondary | ICD-10-CM | POA: Diagnosis not present

## 2022-07-17 DIAGNOSIS — M064 Inflammatory polyarthropathy: Secondary | ICD-10-CM | POA: Diagnosis not present

## 2022-07-17 DIAGNOSIS — I4891 Unspecified atrial fibrillation: Secondary | ICD-10-CM | POA: Diagnosis not present

## 2022-07-17 DIAGNOSIS — I252 Old myocardial infarction: Secondary | ICD-10-CM | POA: Diagnosis not present

## 2022-07-17 DIAGNOSIS — E1142 Type 2 diabetes mellitus with diabetic polyneuropathy: Secondary | ICD-10-CM | POA: Diagnosis not present

## 2022-07-17 DIAGNOSIS — G43909 Migraine, unspecified, not intractable, without status migrainosus: Secondary | ICD-10-CM | POA: Diagnosis not present

## 2022-07-17 DIAGNOSIS — I509 Heart failure, unspecified: Secondary | ICD-10-CM | POA: Diagnosis not present

## 2022-07-17 DIAGNOSIS — M810 Age-related osteoporosis without current pathological fracture: Secondary | ICD-10-CM | POA: Diagnosis not present

## 2022-07-17 DIAGNOSIS — Z7984 Long term (current) use of oral hypoglycemic drugs: Secondary | ICD-10-CM | POA: Diagnosis not present

## 2022-07-17 DIAGNOSIS — E11319 Type 2 diabetes mellitus with unspecified diabetic retinopathy without macular edema: Secondary | ICD-10-CM | POA: Diagnosis not present

## 2022-07-17 DIAGNOSIS — I2510 Atherosclerotic heart disease of native coronary artery without angina pectoris: Secondary | ICD-10-CM | POA: Diagnosis not present

## 2022-07-17 DIAGNOSIS — G4733 Obstructive sleep apnea (adult) (pediatric): Secondary | ICD-10-CM | POA: Diagnosis not present

## 2022-07-28 DIAGNOSIS — I2510 Atherosclerotic heart disease of native coronary artery without angina pectoris: Secondary | ICD-10-CM | POA: Diagnosis not present

## 2022-07-28 DIAGNOSIS — H409 Unspecified glaucoma: Secondary | ICD-10-CM | POA: Diagnosis not present

## 2022-07-28 DIAGNOSIS — G4733 Obstructive sleep apnea (adult) (pediatric): Secondary | ICD-10-CM | POA: Diagnosis not present

## 2022-07-28 DIAGNOSIS — G43909 Migraine, unspecified, not intractable, without status migrainosus: Secondary | ICD-10-CM | POA: Diagnosis not present

## 2022-07-28 DIAGNOSIS — I509 Heart failure, unspecified: Secondary | ICD-10-CM | POA: Diagnosis not present

## 2022-07-28 DIAGNOSIS — E782 Mixed hyperlipidemia: Secondary | ICD-10-CM | POA: Diagnosis not present

## 2022-07-28 DIAGNOSIS — M25551 Pain in right hip: Secondary | ICD-10-CM | POA: Diagnosis not present

## 2022-07-28 DIAGNOSIS — E11319 Type 2 diabetes mellitus with unspecified diabetic retinopathy without macular edema: Secondary | ICD-10-CM | POA: Diagnosis not present

## 2022-07-28 DIAGNOSIS — E1142 Type 2 diabetes mellitus with diabetic polyneuropathy: Secondary | ICD-10-CM | POA: Diagnosis not present

## 2022-07-28 DIAGNOSIS — Z7984 Long term (current) use of oral hypoglycemic drugs: Secondary | ICD-10-CM | POA: Diagnosis not present

## 2022-07-28 DIAGNOSIS — M810 Age-related osteoporosis without current pathological fracture: Secondary | ICD-10-CM | POA: Diagnosis not present

## 2022-07-28 DIAGNOSIS — Z9181 History of falling: Secondary | ICD-10-CM | POA: Diagnosis not present

## 2022-07-28 DIAGNOSIS — I252 Old myocardial infarction: Secondary | ICD-10-CM | POA: Diagnosis not present

## 2022-07-28 DIAGNOSIS — I4891 Unspecified atrial fibrillation: Secondary | ICD-10-CM | POA: Diagnosis not present

## 2022-07-28 DIAGNOSIS — K5790 Diverticulosis of intestine, part unspecified, without perforation or abscess without bleeding: Secondary | ICD-10-CM | POA: Diagnosis not present

## 2022-07-28 DIAGNOSIS — M159 Polyosteoarthritis, unspecified: Secondary | ICD-10-CM | POA: Diagnosis not present

## 2022-07-28 DIAGNOSIS — H04129 Dry eye syndrome of unspecified lacrimal gland: Secondary | ICD-10-CM | POA: Diagnosis not present

## 2022-07-28 DIAGNOSIS — M064 Inflammatory polyarthropathy: Secondary | ICD-10-CM | POA: Diagnosis not present

## 2022-07-28 DIAGNOSIS — K449 Diaphragmatic hernia without obstruction or gangrene: Secondary | ICD-10-CM | POA: Diagnosis not present

## 2022-07-28 DIAGNOSIS — K219 Gastro-esophageal reflux disease without esophagitis: Secondary | ICD-10-CM | POA: Diagnosis not present

## 2022-07-28 DIAGNOSIS — D509 Iron deficiency anemia, unspecified: Secondary | ICD-10-CM | POA: Diagnosis not present

## 2022-07-28 DIAGNOSIS — E039 Hypothyroidism, unspecified: Secondary | ICD-10-CM | POA: Diagnosis not present

## 2022-07-28 DIAGNOSIS — I110 Hypertensive heart disease with heart failure: Secondary | ICD-10-CM | POA: Diagnosis not present

## 2022-07-29 DIAGNOSIS — I2510 Atherosclerotic heart disease of native coronary artery without angina pectoris: Secondary | ICD-10-CM | POA: Diagnosis not present

## 2022-07-29 DIAGNOSIS — M064 Inflammatory polyarthropathy: Secondary | ICD-10-CM | POA: Diagnosis not present

## 2022-07-29 DIAGNOSIS — I509 Heart failure, unspecified: Secondary | ICD-10-CM | POA: Diagnosis not present

## 2022-07-29 DIAGNOSIS — M810 Age-related osteoporosis without current pathological fracture: Secondary | ICD-10-CM | POA: Diagnosis not present

## 2022-07-29 DIAGNOSIS — I110 Hypertensive heart disease with heart failure: Secondary | ICD-10-CM | POA: Diagnosis not present

## 2022-07-29 DIAGNOSIS — E039 Hypothyroidism, unspecified: Secondary | ICD-10-CM | POA: Diagnosis not present

## 2022-07-29 DIAGNOSIS — E1142 Type 2 diabetes mellitus with diabetic polyneuropathy: Secondary | ICD-10-CM | POA: Diagnosis not present

## 2022-07-29 DIAGNOSIS — K449 Diaphragmatic hernia without obstruction or gangrene: Secondary | ICD-10-CM | POA: Diagnosis not present

## 2022-07-29 DIAGNOSIS — D509 Iron deficiency anemia, unspecified: Secondary | ICD-10-CM | POA: Diagnosis not present

## 2022-07-29 DIAGNOSIS — K219 Gastro-esophageal reflux disease without esophagitis: Secondary | ICD-10-CM | POA: Diagnosis not present

## 2022-07-29 DIAGNOSIS — K5790 Diverticulosis of intestine, part unspecified, without perforation or abscess without bleeding: Secondary | ICD-10-CM | POA: Diagnosis not present

## 2022-07-29 DIAGNOSIS — I252 Old myocardial infarction: Secondary | ICD-10-CM | POA: Diagnosis not present

## 2022-07-29 DIAGNOSIS — M25551 Pain in right hip: Secondary | ICD-10-CM | POA: Diagnosis not present

## 2022-07-29 DIAGNOSIS — E782 Mixed hyperlipidemia: Secondary | ICD-10-CM | POA: Diagnosis not present

## 2022-07-29 DIAGNOSIS — G4733 Obstructive sleep apnea (adult) (pediatric): Secondary | ICD-10-CM | POA: Diagnosis not present

## 2022-07-29 DIAGNOSIS — H409 Unspecified glaucoma: Secondary | ICD-10-CM | POA: Diagnosis not present

## 2022-07-29 DIAGNOSIS — Z9181 History of falling: Secondary | ICD-10-CM | POA: Diagnosis not present

## 2022-07-29 DIAGNOSIS — M159 Polyosteoarthritis, unspecified: Secondary | ICD-10-CM | POA: Diagnosis not present

## 2022-07-29 DIAGNOSIS — I4891 Unspecified atrial fibrillation: Secondary | ICD-10-CM | POA: Diagnosis not present

## 2022-07-29 DIAGNOSIS — H04129 Dry eye syndrome of unspecified lacrimal gland: Secondary | ICD-10-CM | POA: Diagnosis not present

## 2022-07-29 DIAGNOSIS — Z7984 Long term (current) use of oral hypoglycemic drugs: Secondary | ICD-10-CM | POA: Diagnosis not present

## 2022-07-29 DIAGNOSIS — E11319 Type 2 diabetes mellitus with unspecified diabetic retinopathy without macular edema: Secondary | ICD-10-CM | POA: Diagnosis not present

## 2022-07-29 DIAGNOSIS — G43909 Migraine, unspecified, not intractable, without status migrainosus: Secondary | ICD-10-CM | POA: Diagnosis not present

## 2022-08-03 DIAGNOSIS — I2510 Atherosclerotic heart disease of native coronary artery without angina pectoris: Secondary | ICD-10-CM | POA: Diagnosis not present

## 2022-08-03 DIAGNOSIS — H04129 Dry eye syndrome of unspecified lacrimal gland: Secondary | ICD-10-CM | POA: Diagnosis not present

## 2022-08-03 DIAGNOSIS — K219 Gastro-esophageal reflux disease without esophagitis: Secondary | ICD-10-CM | POA: Diagnosis not present

## 2022-08-03 DIAGNOSIS — M064 Inflammatory polyarthropathy: Secondary | ICD-10-CM | POA: Diagnosis not present

## 2022-08-03 DIAGNOSIS — I110 Hypertensive heart disease with heart failure: Secondary | ICD-10-CM | POA: Diagnosis not present

## 2022-08-03 DIAGNOSIS — Z9181 History of falling: Secondary | ICD-10-CM | POA: Diagnosis not present

## 2022-08-03 DIAGNOSIS — I252 Old myocardial infarction: Secondary | ICD-10-CM | POA: Diagnosis not present

## 2022-08-03 DIAGNOSIS — E1142 Type 2 diabetes mellitus with diabetic polyneuropathy: Secondary | ICD-10-CM | POA: Diagnosis not present

## 2022-08-03 DIAGNOSIS — Z7984 Long term (current) use of oral hypoglycemic drugs: Secondary | ICD-10-CM | POA: Diagnosis not present

## 2022-08-03 DIAGNOSIS — E11319 Type 2 diabetes mellitus with unspecified diabetic retinopathy without macular edema: Secondary | ICD-10-CM | POA: Diagnosis not present

## 2022-08-03 DIAGNOSIS — G43909 Migraine, unspecified, not intractable, without status migrainosus: Secondary | ICD-10-CM | POA: Diagnosis not present

## 2022-08-03 DIAGNOSIS — G4733 Obstructive sleep apnea (adult) (pediatric): Secondary | ICD-10-CM | POA: Diagnosis not present

## 2022-08-03 DIAGNOSIS — K5790 Diverticulosis of intestine, part unspecified, without perforation or abscess without bleeding: Secondary | ICD-10-CM | POA: Diagnosis not present

## 2022-08-03 DIAGNOSIS — H409 Unspecified glaucoma: Secondary | ICD-10-CM | POA: Diagnosis not present

## 2022-08-03 DIAGNOSIS — K449 Diaphragmatic hernia without obstruction or gangrene: Secondary | ICD-10-CM | POA: Diagnosis not present

## 2022-08-03 DIAGNOSIS — D509 Iron deficiency anemia, unspecified: Secondary | ICD-10-CM | POA: Diagnosis not present

## 2022-08-03 DIAGNOSIS — M159 Polyosteoarthritis, unspecified: Secondary | ICD-10-CM | POA: Diagnosis not present

## 2022-08-03 DIAGNOSIS — M25551 Pain in right hip: Secondary | ICD-10-CM | POA: Diagnosis not present

## 2022-08-03 DIAGNOSIS — M810 Age-related osteoporosis without current pathological fracture: Secondary | ICD-10-CM | POA: Diagnosis not present

## 2022-08-03 DIAGNOSIS — E782 Mixed hyperlipidemia: Secondary | ICD-10-CM | POA: Diagnosis not present

## 2022-08-03 DIAGNOSIS — I4891 Unspecified atrial fibrillation: Secondary | ICD-10-CM | POA: Diagnosis not present

## 2022-08-03 DIAGNOSIS — I509 Heart failure, unspecified: Secondary | ICD-10-CM | POA: Diagnosis not present

## 2022-08-03 DIAGNOSIS — E039 Hypothyroidism, unspecified: Secondary | ICD-10-CM | POA: Diagnosis not present

## 2022-08-04 DIAGNOSIS — K219 Gastro-esophageal reflux disease without esophagitis: Secondary | ICD-10-CM | POA: Diagnosis not present

## 2022-08-04 DIAGNOSIS — Z7984 Long term (current) use of oral hypoglycemic drugs: Secondary | ICD-10-CM | POA: Diagnosis not present

## 2022-08-04 DIAGNOSIS — G43909 Migraine, unspecified, not intractable, without status migrainosus: Secondary | ICD-10-CM | POA: Diagnosis not present

## 2022-08-04 DIAGNOSIS — I509 Heart failure, unspecified: Secondary | ICD-10-CM | POA: Diagnosis not present

## 2022-08-04 DIAGNOSIS — I252 Old myocardial infarction: Secondary | ICD-10-CM | POA: Diagnosis not present

## 2022-08-04 DIAGNOSIS — M064 Inflammatory polyarthropathy: Secondary | ICD-10-CM | POA: Diagnosis not present

## 2022-08-04 DIAGNOSIS — M25551 Pain in right hip: Secondary | ICD-10-CM | POA: Diagnosis not present

## 2022-08-04 DIAGNOSIS — E11319 Type 2 diabetes mellitus with unspecified diabetic retinopathy without macular edema: Secondary | ICD-10-CM | POA: Diagnosis not present

## 2022-08-04 DIAGNOSIS — E1142 Type 2 diabetes mellitus with diabetic polyneuropathy: Secondary | ICD-10-CM | POA: Diagnosis not present

## 2022-08-04 DIAGNOSIS — H409 Unspecified glaucoma: Secondary | ICD-10-CM | POA: Diagnosis not present

## 2022-08-04 DIAGNOSIS — E039 Hypothyroidism, unspecified: Secondary | ICD-10-CM | POA: Diagnosis not present

## 2022-08-04 DIAGNOSIS — M159 Polyosteoarthritis, unspecified: Secondary | ICD-10-CM | POA: Diagnosis not present

## 2022-08-04 DIAGNOSIS — E782 Mixed hyperlipidemia: Secondary | ICD-10-CM | POA: Diagnosis not present

## 2022-08-04 DIAGNOSIS — H04129 Dry eye syndrome of unspecified lacrimal gland: Secondary | ICD-10-CM | POA: Diagnosis not present

## 2022-08-04 DIAGNOSIS — D509 Iron deficiency anemia, unspecified: Secondary | ICD-10-CM | POA: Diagnosis not present

## 2022-08-04 DIAGNOSIS — K5790 Diverticulosis of intestine, part unspecified, without perforation or abscess without bleeding: Secondary | ICD-10-CM | POA: Diagnosis not present

## 2022-08-04 DIAGNOSIS — I4891 Unspecified atrial fibrillation: Secondary | ICD-10-CM | POA: Diagnosis not present

## 2022-08-04 DIAGNOSIS — M810 Age-related osteoporosis without current pathological fracture: Secondary | ICD-10-CM | POA: Diagnosis not present

## 2022-08-04 DIAGNOSIS — K449 Diaphragmatic hernia without obstruction or gangrene: Secondary | ICD-10-CM | POA: Diagnosis not present

## 2022-08-04 DIAGNOSIS — G4733 Obstructive sleep apnea (adult) (pediatric): Secondary | ICD-10-CM | POA: Diagnosis not present

## 2022-08-04 DIAGNOSIS — I2510 Atherosclerotic heart disease of native coronary artery without angina pectoris: Secondary | ICD-10-CM | POA: Diagnosis not present

## 2022-08-04 DIAGNOSIS — I110 Hypertensive heart disease with heart failure: Secondary | ICD-10-CM | POA: Diagnosis not present

## 2022-08-04 DIAGNOSIS — Z9181 History of falling: Secondary | ICD-10-CM | POA: Diagnosis not present

## 2022-08-08 DIAGNOSIS — K449 Diaphragmatic hernia without obstruction or gangrene: Secondary | ICD-10-CM | POA: Diagnosis not present

## 2022-08-08 DIAGNOSIS — Z7984 Long term (current) use of oral hypoglycemic drugs: Secondary | ICD-10-CM | POA: Diagnosis not present

## 2022-08-08 DIAGNOSIS — M159 Polyosteoarthritis, unspecified: Secondary | ICD-10-CM | POA: Diagnosis not present

## 2022-08-08 DIAGNOSIS — G4733 Obstructive sleep apnea (adult) (pediatric): Secondary | ICD-10-CM | POA: Diagnosis not present

## 2022-08-08 DIAGNOSIS — E11319 Type 2 diabetes mellitus with unspecified diabetic retinopathy without macular edema: Secondary | ICD-10-CM | POA: Diagnosis not present

## 2022-08-08 DIAGNOSIS — I2510 Atherosclerotic heart disease of native coronary artery without angina pectoris: Secondary | ICD-10-CM | POA: Diagnosis not present

## 2022-08-08 DIAGNOSIS — E1142 Type 2 diabetes mellitus with diabetic polyneuropathy: Secondary | ICD-10-CM | POA: Diagnosis not present

## 2022-08-08 DIAGNOSIS — E039 Hypothyroidism, unspecified: Secondary | ICD-10-CM | POA: Diagnosis not present

## 2022-08-08 DIAGNOSIS — E782 Mixed hyperlipidemia: Secondary | ICD-10-CM | POA: Diagnosis not present

## 2022-08-08 DIAGNOSIS — K5790 Diverticulosis of intestine, part unspecified, without perforation or abscess without bleeding: Secondary | ICD-10-CM | POA: Diagnosis not present

## 2022-08-08 DIAGNOSIS — M064 Inflammatory polyarthropathy: Secondary | ICD-10-CM | POA: Diagnosis not present

## 2022-08-08 DIAGNOSIS — I4891 Unspecified atrial fibrillation: Secondary | ICD-10-CM | POA: Diagnosis not present

## 2022-08-08 DIAGNOSIS — I509 Heart failure, unspecified: Secondary | ICD-10-CM | POA: Diagnosis not present

## 2022-08-08 DIAGNOSIS — M810 Age-related osteoporosis without current pathological fracture: Secondary | ICD-10-CM | POA: Diagnosis not present

## 2022-08-08 DIAGNOSIS — K219 Gastro-esophageal reflux disease without esophagitis: Secondary | ICD-10-CM | POA: Diagnosis not present

## 2022-08-08 DIAGNOSIS — G43909 Migraine, unspecified, not intractable, without status migrainosus: Secondary | ICD-10-CM | POA: Diagnosis not present

## 2022-08-08 DIAGNOSIS — H409 Unspecified glaucoma: Secondary | ICD-10-CM | POA: Diagnosis not present

## 2022-08-08 DIAGNOSIS — I110 Hypertensive heart disease with heart failure: Secondary | ICD-10-CM | POA: Diagnosis not present

## 2022-08-08 DIAGNOSIS — Z9181 History of falling: Secondary | ICD-10-CM | POA: Diagnosis not present

## 2022-08-08 DIAGNOSIS — H04129 Dry eye syndrome of unspecified lacrimal gland: Secondary | ICD-10-CM | POA: Diagnosis not present

## 2022-08-08 DIAGNOSIS — D509 Iron deficiency anemia, unspecified: Secondary | ICD-10-CM | POA: Diagnosis not present

## 2022-08-08 DIAGNOSIS — M25551 Pain in right hip: Secondary | ICD-10-CM | POA: Diagnosis not present

## 2022-08-08 DIAGNOSIS — I252 Old myocardial infarction: Secondary | ICD-10-CM | POA: Diagnosis not present

## 2022-08-09 DIAGNOSIS — G4700 Insomnia, unspecified: Secondary | ICD-10-CM | POA: Diagnosis not present

## 2022-08-09 DIAGNOSIS — I10 Essential (primary) hypertension: Secondary | ICD-10-CM | POA: Diagnosis not present

## 2022-08-09 DIAGNOSIS — E1142 Type 2 diabetes mellitus with diabetic polyneuropathy: Secondary | ICD-10-CM | POA: Diagnosis not present

## 2022-08-09 DIAGNOSIS — M810 Age-related osteoporosis without current pathological fracture: Secondary | ICD-10-CM | POA: Diagnosis not present

## 2022-08-09 DIAGNOSIS — G43009 Migraine without aura, not intractable, without status migrainosus: Secondary | ICD-10-CM | POA: Diagnosis not present

## 2022-08-09 DIAGNOSIS — I509 Heart failure, unspecified: Secondary | ICD-10-CM | POA: Diagnosis not present

## 2022-08-09 DIAGNOSIS — K219 Gastro-esophageal reflux disease without esophagitis: Secondary | ICD-10-CM | POA: Diagnosis not present

## 2022-08-09 DIAGNOSIS — E039 Hypothyroidism, unspecified: Secondary | ICD-10-CM | POA: Diagnosis not present

## 2022-08-09 DIAGNOSIS — E782 Mixed hyperlipidemia: Secondary | ICD-10-CM | POA: Diagnosis not present

## 2022-08-10 ENCOUNTER — Ambulatory Visit: Payer: Medicare Other | Admitting: Podiatry

## 2022-08-12 DIAGNOSIS — H409 Unspecified glaucoma: Secondary | ICD-10-CM | POA: Diagnosis not present

## 2022-08-12 DIAGNOSIS — E1142 Type 2 diabetes mellitus with diabetic polyneuropathy: Secondary | ICD-10-CM | POA: Diagnosis not present

## 2022-08-12 DIAGNOSIS — G43909 Migraine, unspecified, not intractable, without status migrainosus: Secondary | ICD-10-CM | POA: Diagnosis not present

## 2022-08-12 DIAGNOSIS — M25551 Pain in right hip: Secondary | ICD-10-CM | POA: Diagnosis not present

## 2022-08-12 DIAGNOSIS — E11319 Type 2 diabetes mellitus with unspecified diabetic retinopathy without macular edema: Secondary | ICD-10-CM | POA: Diagnosis not present

## 2022-08-12 DIAGNOSIS — M159 Polyosteoarthritis, unspecified: Secondary | ICD-10-CM | POA: Diagnosis not present

## 2022-08-12 DIAGNOSIS — Z9181 History of falling: Secondary | ICD-10-CM | POA: Diagnosis not present

## 2022-08-12 DIAGNOSIS — H04129 Dry eye syndrome of unspecified lacrimal gland: Secondary | ICD-10-CM | POA: Diagnosis not present

## 2022-08-12 DIAGNOSIS — I110 Hypertensive heart disease with heart failure: Secondary | ICD-10-CM | POA: Diagnosis not present

## 2022-08-12 DIAGNOSIS — K219 Gastro-esophageal reflux disease without esophagitis: Secondary | ICD-10-CM | POA: Diagnosis not present

## 2022-08-12 DIAGNOSIS — D509 Iron deficiency anemia, unspecified: Secondary | ICD-10-CM | POA: Diagnosis not present

## 2022-08-12 DIAGNOSIS — E782 Mixed hyperlipidemia: Secondary | ICD-10-CM | POA: Diagnosis not present

## 2022-08-12 DIAGNOSIS — K5790 Diverticulosis of intestine, part unspecified, without perforation or abscess without bleeding: Secondary | ICD-10-CM | POA: Diagnosis not present

## 2022-08-12 DIAGNOSIS — Z7984 Long term (current) use of oral hypoglycemic drugs: Secondary | ICD-10-CM | POA: Diagnosis not present

## 2022-08-12 DIAGNOSIS — K449 Diaphragmatic hernia without obstruction or gangrene: Secondary | ICD-10-CM | POA: Diagnosis not present

## 2022-08-12 DIAGNOSIS — I252 Old myocardial infarction: Secondary | ICD-10-CM | POA: Diagnosis not present

## 2022-08-12 DIAGNOSIS — I2510 Atherosclerotic heart disease of native coronary artery without angina pectoris: Secondary | ICD-10-CM | POA: Diagnosis not present

## 2022-08-12 DIAGNOSIS — G4733 Obstructive sleep apnea (adult) (pediatric): Secondary | ICD-10-CM | POA: Diagnosis not present

## 2022-08-12 DIAGNOSIS — E039 Hypothyroidism, unspecified: Secondary | ICD-10-CM | POA: Diagnosis not present

## 2022-08-12 DIAGNOSIS — M064 Inflammatory polyarthropathy: Secondary | ICD-10-CM | POA: Diagnosis not present

## 2022-08-12 DIAGNOSIS — I4891 Unspecified atrial fibrillation: Secondary | ICD-10-CM | POA: Diagnosis not present

## 2022-08-12 DIAGNOSIS — I509 Heart failure, unspecified: Secondary | ICD-10-CM | POA: Diagnosis not present

## 2022-08-12 DIAGNOSIS — M810 Age-related osteoporosis without current pathological fracture: Secondary | ICD-10-CM | POA: Diagnosis not present

## 2022-08-17 DIAGNOSIS — I110 Hypertensive heart disease with heart failure: Secondary | ICD-10-CM | POA: Diagnosis not present

## 2022-08-17 DIAGNOSIS — E782 Mixed hyperlipidemia: Secondary | ICD-10-CM | POA: Diagnosis not present

## 2022-08-17 DIAGNOSIS — Z7984 Long term (current) use of oral hypoglycemic drugs: Secondary | ICD-10-CM | POA: Diagnosis not present

## 2022-08-17 DIAGNOSIS — M810 Age-related osteoporosis without current pathological fracture: Secondary | ICD-10-CM | POA: Diagnosis not present

## 2022-08-17 DIAGNOSIS — K219 Gastro-esophageal reflux disease without esophagitis: Secondary | ICD-10-CM | POA: Diagnosis not present

## 2022-08-17 DIAGNOSIS — I4891 Unspecified atrial fibrillation: Secondary | ICD-10-CM | POA: Diagnosis not present

## 2022-08-17 DIAGNOSIS — M159 Polyosteoarthritis, unspecified: Secondary | ICD-10-CM | POA: Diagnosis not present

## 2022-08-17 DIAGNOSIS — E11319 Type 2 diabetes mellitus with unspecified diabetic retinopathy without macular edema: Secondary | ICD-10-CM | POA: Diagnosis not present

## 2022-08-17 DIAGNOSIS — E1142 Type 2 diabetes mellitus with diabetic polyneuropathy: Secondary | ICD-10-CM | POA: Diagnosis not present

## 2022-08-17 DIAGNOSIS — D509 Iron deficiency anemia, unspecified: Secondary | ICD-10-CM | POA: Diagnosis not present

## 2022-08-17 DIAGNOSIS — G43909 Migraine, unspecified, not intractable, without status migrainosus: Secondary | ICD-10-CM | POA: Diagnosis not present

## 2022-08-17 DIAGNOSIS — I509 Heart failure, unspecified: Secondary | ICD-10-CM | POA: Diagnosis not present

## 2022-08-17 DIAGNOSIS — M25551 Pain in right hip: Secondary | ICD-10-CM | POA: Diagnosis not present

## 2022-08-17 DIAGNOSIS — I252 Old myocardial infarction: Secondary | ICD-10-CM | POA: Diagnosis not present

## 2022-08-17 DIAGNOSIS — G4733 Obstructive sleep apnea (adult) (pediatric): Secondary | ICD-10-CM | POA: Diagnosis not present

## 2022-08-17 DIAGNOSIS — H409 Unspecified glaucoma: Secondary | ICD-10-CM | POA: Diagnosis not present

## 2022-08-17 DIAGNOSIS — I2510 Atherosclerotic heart disease of native coronary artery without angina pectoris: Secondary | ICD-10-CM | POA: Diagnosis not present

## 2022-08-17 DIAGNOSIS — Z9181 History of falling: Secondary | ICD-10-CM | POA: Diagnosis not present

## 2022-08-17 DIAGNOSIS — K449 Diaphragmatic hernia without obstruction or gangrene: Secondary | ICD-10-CM | POA: Diagnosis not present

## 2022-08-17 DIAGNOSIS — K5790 Diverticulosis of intestine, part unspecified, without perforation or abscess without bleeding: Secondary | ICD-10-CM | POA: Diagnosis not present

## 2022-08-17 DIAGNOSIS — M064 Inflammatory polyarthropathy: Secondary | ICD-10-CM | POA: Diagnosis not present

## 2022-08-17 DIAGNOSIS — E039 Hypothyroidism, unspecified: Secondary | ICD-10-CM | POA: Diagnosis not present

## 2022-08-17 DIAGNOSIS — H04129 Dry eye syndrome of unspecified lacrimal gland: Secondary | ICD-10-CM | POA: Diagnosis not present

## 2022-08-23 NOTE — Telephone Encounter (Signed)
Prolia VOB initiated via [email or web address]  Last Prolia inj 03/22/22 Next Prolia inj due 09/23/22   Prior Auth: APPROVED PA# [id number] Valid: 07/02/21-07/02/22

## 2022-08-25 DIAGNOSIS — E1142 Type 2 diabetes mellitus with diabetic polyneuropathy: Secondary | ICD-10-CM | POA: Diagnosis not present

## 2022-08-25 DIAGNOSIS — M159 Polyosteoarthritis, unspecified: Secondary | ICD-10-CM | POA: Diagnosis not present

## 2022-08-25 DIAGNOSIS — H04129 Dry eye syndrome of unspecified lacrimal gland: Secondary | ICD-10-CM | POA: Diagnosis not present

## 2022-08-25 DIAGNOSIS — E039 Hypothyroidism, unspecified: Secondary | ICD-10-CM | POA: Diagnosis not present

## 2022-08-25 DIAGNOSIS — E782 Mixed hyperlipidemia: Secondary | ICD-10-CM | POA: Diagnosis not present

## 2022-08-25 DIAGNOSIS — I509 Heart failure, unspecified: Secondary | ICD-10-CM | POA: Diagnosis not present

## 2022-08-25 DIAGNOSIS — D509 Iron deficiency anemia, unspecified: Secondary | ICD-10-CM | POA: Diagnosis not present

## 2022-08-25 DIAGNOSIS — E11319 Type 2 diabetes mellitus with unspecified diabetic retinopathy without macular edema: Secondary | ICD-10-CM | POA: Diagnosis not present

## 2022-08-25 DIAGNOSIS — I110 Hypertensive heart disease with heart failure: Secondary | ICD-10-CM | POA: Diagnosis not present

## 2022-08-25 DIAGNOSIS — G4733 Obstructive sleep apnea (adult) (pediatric): Secondary | ICD-10-CM | POA: Diagnosis not present

## 2022-08-25 DIAGNOSIS — Z7984 Long term (current) use of oral hypoglycemic drugs: Secondary | ICD-10-CM | POA: Diagnosis not present

## 2022-08-25 DIAGNOSIS — I4891 Unspecified atrial fibrillation: Secondary | ICD-10-CM | POA: Diagnosis not present

## 2022-08-25 DIAGNOSIS — K5790 Diverticulosis of intestine, part unspecified, without perforation or abscess without bleeding: Secondary | ICD-10-CM | POA: Diagnosis not present

## 2022-08-25 DIAGNOSIS — M064 Inflammatory polyarthropathy: Secondary | ICD-10-CM | POA: Diagnosis not present

## 2022-08-25 DIAGNOSIS — K449 Diaphragmatic hernia without obstruction or gangrene: Secondary | ICD-10-CM | POA: Diagnosis not present

## 2022-08-25 DIAGNOSIS — G43909 Migraine, unspecified, not intractable, without status migrainosus: Secondary | ICD-10-CM | POA: Diagnosis not present

## 2022-08-25 DIAGNOSIS — Z9181 History of falling: Secondary | ICD-10-CM | POA: Diagnosis not present

## 2022-08-25 DIAGNOSIS — K219 Gastro-esophageal reflux disease without esophagitis: Secondary | ICD-10-CM | POA: Diagnosis not present

## 2022-08-25 DIAGNOSIS — H409 Unspecified glaucoma: Secondary | ICD-10-CM | POA: Diagnosis not present

## 2022-08-25 DIAGNOSIS — M810 Age-related osteoporosis without current pathological fracture: Secondary | ICD-10-CM | POA: Diagnosis not present

## 2022-08-25 DIAGNOSIS — I252 Old myocardial infarction: Secondary | ICD-10-CM | POA: Diagnosis not present

## 2022-08-25 DIAGNOSIS — M25551 Pain in right hip: Secondary | ICD-10-CM | POA: Diagnosis not present

## 2022-08-25 DIAGNOSIS — I2510 Atherosclerotic heart disease of native coronary artery without angina pectoris: Secondary | ICD-10-CM | POA: Diagnosis not present

## 2022-09-01 DIAGNOSIS — I4891 Unspecified atrial fibrillation: Secondary | ICD-10-CM | POA: Diagnosis not present

## 2022-09-01 DIAGNOSIS — I2510 Atherosclerotic heart disease of native coronary artery without angina pectoris: Secondary | ICD-10-CM | POA: Diagnosis not present

## 2022-09-01 DIAGNOSIS — M159 Polyosteoarthritis, unspecified: Secondary | ICD-10-CM | POA: Diagnosis not present

## 2022-09-01 DIAGNOSIS — G43909 Migraine, unspecified, not intractable, without status migrainosus: Secondary | ICD-10-CM | POA: Diagnosis not present

## 2022-09-01 DIAGNOSIS — I10 Essential (primary) hypertension: Secondary | ICD-10-CM | POA: Diagnosis not present

## 2022-09-01 DIAGNOSIS — I252 Old myocardial infarction: Secondary | ICD-10-CM | POA: Diagnosis not present

## 2022-09-01 DIAGNOSIS — M810 Age-related osteoporosis without current pathological fracture: Secondary | ICD-10-CM | POA: Diagnosis not present

## 2022-09-01 DIAGNOSIS — D509 Iron deficiency anemia, unspecified: Secondary | ICD-10-CM | POA: Diagnosis not present

## 2022-09-01 DIAGNOSIS — K5790 Diverticulosis of intestine, part unspecified, without perforation or abscess without bleeding: Secondary | ICD-10-CM | POA: Diagnosis not present

## 2022-09-01 DIAGNOSIS — K449 Diaphragmatic hernia without obstruction or gangrene: Secondary | ICD-10-CM | POA: Diagnosis not present

## 2022-09-01 DIAGNOSIS — E1142 Type 2 diabetes mellitus with diabetic polyneuropathy: Secondary | ICD-10-CM | POA: Diagnosis not present

## 2022-09-01 DIAGNOSIS — G4733 Obstructive sleep apnea (adult) (pediatric): Secondary | ICD-10-CM | POA: Diagnosis not present

## 2022-09-01 DIAGNOSIS — E782 Mixed hyperlipidemia: Secondary | ICD-10-CM | POA: Diagnosis not present

## 2022-09-01 DIAGNOSIS — M064 Inflammatory polyarthropathy: Secondary | ICD-10-CM | POA: Diagnosis not present

## 2022-09-01 DIAGNOSIS — Z7984 Long term (current) use of oral hypoglycemic drugs: Secondary | ICD-10-CM | POA: Diagnosis not present

## 2022-09-01 DIAGNOSIS — H409 Unspecified glaucoma: Secondary | ICD-10-CM | POA: Diagnosis not present

## 2022-09-01 DIAGNOSIS — Z9181 History of falling: Secondary | ICD-10-CM | POA: Diagnosis not present

## 2022-09-01 DIAGNOSIS — G4700 Insomnia, unspecified: Secondary | ICD-10-CM | POA: Diagnosis not present

## 2022-09-01 DIAGNOSIS — K219 Gastro-esophageal reflux disease without esophagitis: Secondary | ICD-10-CM | POA: Diagnosis not present

## 2022-09-01 DIAGNOSIS — I509 Heart failure, unspecified: Secondary | ICD-10-CM | POA: Diagnosis not present

## 2022-09-01 DIAGNOSIS — E11319 Type 2 diabetes mellitus with unspecified diabetic retinopathy without macular edema: Secondary | ICD-10-CM | POA: Diagnosis not present

## 2022-09-01 DIAGNOSIS — H04129 Dry eye syndrome of unspecified lacrimal gland: Secondary | ICD-10-CM | POA: Diagnosis not present

## 2022-09-01 DIAGNOSIS — I110 Hypertensive heart disease with heart failure: Secondary | ICD-10-CM | POA: Diagnosis not present

## 2022-09-01 DIAGNOSIS — E039 Hypothyroidism, unspecified: Secondary | ICD-10-CM | POA: Diagnosis not present

## 2022-09-01 DIAGNOSIS — M25551 Pain in right hip: Secondary | ICD-10-CM | POA: Diagnosis not present

## 2022-09-05 ENCOUNTER — Ambulatory Visit (INDEPENDENT_AMBULATORY_CARE_PROVIDER_SITE_OTHER): Payer: Medicare Other | Admitting: Podiatry

## 2022-09-05 ENCOUNTER — Encounter: Payer: Self-pay | Admitting: Podiatry

## 2022-09-05 DIAGNOSIS — M79675 Pain in left toe(s): Secondary | ICD-10-CM | POA: Diagnosis not present

## 2022-09-05 DIAGNOSIS — B351 Tinea unguium: Secondary | ICD-10-CM

## 2022-09-05 DIAGNOSIS — M79674 Pain in right toe(s): Secondary | ICD-10-CM

## 2022-09-05 DIAGNOSIS — N1831 Chronic kidney disease, stage 3a: Secondary | ICD-10-CM | POA: Diagnosis not present

## 2022-09-05 DIAGNOSIS — Q828 Other specified congenital malformations of skin: Secondary | ICD-10-CM

## 2022-09-05 DIAGNOSIS — E0842 Diabetes mellitus due to underlying condition with diabetic polyneuropathy: Secondary | ICD-10-CM

## 2022-09-05 NOTE — Progress Notes (Signed)
This patient returns to my office for at risk foot care.  This patient requires this care by a professional since this patient will be at risk due to having diabetes and chronic kidney disease.  This patient is unable to cut nails herself since the patient cannot reach her nails.These nails are painful walking and wearing shoes.  She has a painful callus under her big toe joint right foot.  This is painful walking and wearing her shoes.    This patient presents for at risk foot care today.  General Appearance  Alert, conversant and in no acute stress.  Vascular  Dorsalis pedis and posterior tibial  pulses are weakly  palpable  bilaterally.  Capillary return is within normal limits  bilaterally. Temperature is within normal limits  bilaterally.  Neurologic  Senn-Weinstein monofilament wire test within normal limits/diminished   bilaterally. Muscle power within normal limits bilaterally.  Nails Thick disfigured discolored nails with subungual debris  from hallux to fifth toes bilaterally. No evidence of bacterial infection or drainage bilaterally.  Orthopedic  No limitations of motion  feet .  No crepitus or effusions noted.  No bony pathology or digital deformities noted.  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    Skin  normotropic skin  noted bilaterally.  No signs of infections or ulcers noted.  Callus sub 1st MPJ right foot.   Onychomycosis  Pain in right toes  Pain in left toes  Callus sub 1 right foot.    Consent was obtained for treatment procedures.   Mechanical debridement of nails 1-5  bilaterally performed with a nail nipper.  Filed with dremel without incident.  Debridement of callus with # 15 blade. Padding applied to insole in right shoe.   Return office visit  3 months                   Told patient to return for periodic foot care and evaluation due to potential at risk complications.   Gardiner Barefoot DPM

## 2022-09-06 NOTE — Telephone Encounter (Signed)
Prior renewal required for PROLIA  PA PROCESS DETAILS: Please complete the prior authorization form located at [email or web address] or call [phone].

## 2022-09-07 DIAGNOSIS — Z9181 History of falling: Secondary | ICD-10-CM | POA: Diagnosis not present

## 2022-09-07 DIAGNOSIS — E039 Hypothyroidism, unspecified: Secondary | ICD-10-CM | POA: Diagnosis not present

## 2022-09-07 DIAGNOSIS — Z7984 Long term (current) use of oral hypoglycemic drugs: Secondary | ICD-10-CM | POA: Diagnosis not present

## 2022-09-07 DIAGNOSIS — G43909 Migraine, unspecified, not intractable, without status migrainosus: Secondary | ICD-10-CM | POA: Diagnosis not present

## 2022-09-07 DIAGNOSIS — I509 Heart failure, unspecified: Secondary | ICD-10-CM | POA: Diagnosis not present

## 2022-09-07 DIAGNOSIS — D509 Iron deficiency anemia, unspecified: Secondary | ICD-10-CM | POA: Diagnosis not present

## 2022-09-07 DIAGNOSIS — H409 Unspecified glaucoma: Secondary | ICD-10-CM | POA: Diagnosis not present

## 2022-09-07 DIAGNOSIS — E11319 Type 2 diabetes mellitus with unspecified diabetic retinopathy without macular edema: Secondary | ICD-10-CM | POA: Diagnosis not present

## 2022-09-07 DIAGNOSIS — M810 Age-related osteoporosis without current pathological fracture: Secondary | ICD-10-CM | POA: Diagnosis not present

## 2022-09-07 DIAGNOSIS — M25551 Pain in right hip: Secondary | ICD-10-CM | POA: Diagnosis not present

## 2022-09-07 DIAGNOSIS — K449 Diaphragmatic hernia without obstruction or gangrene: Secondary | ICD-10-CM | POA: Diagnosis not present

## 2022-09-07 DIAGNOSIS — I2510 Atherosclerotic heart disease of native coronary artery without angina pectoris: Secondary | ICD-10-CM | POA: Diagnosis not present

## 2022-09-07 DIAGNOSIS — E1142 Type 2 diabetes mellitus with diabetic polyneuropathy: Secondary | ICD-10-CM | POA: Diagnosis not present

## 2022-09-07 DIAGNOSIS — H04129 Dry eye syndrome of unspecified lacrimal gland: Secondary | ICD-10-CM | POA: Diagnosis not present

## 2022-09-07 DIAGNOSIS — M064 Inflammatory polyarthropathy: Secondary | ICD-10-CM | POA: Diagnosis not present

## 2022-09-07 DIAGNOSIS — I252 Old myocardial infarction: Secondary | ICD-10-CM | POA: Diagnosis not present

## 2022-09-07 DIAGNOSIS — I110 Hypertensive heart disease with heart failure: Secondary | ICD-10-CM | POA: Diagnosis not present

## 2022-09-07 DIAGNOSIS — G4733 Obstructive sleep apnea (adult) (pediatric): Secondary | ICD-10-CM | POA: Diagnosis not present

## 2022-09-07 DIAGNOSIS — K5790 Diverticulosis of intestine, part unspecified, without perforation or abscess without bleeding: Secondary | ICD-10-CM | POA: Diagnosis not present

## 2022-09-07 DIAGNOSIS — K219 Gastro-esophageal reflux disease without esophagitis: Secondary | ICD-10-CM | POA: Diagnosis not present

## 2022-09-07 DIAGNOSIS — I4891 Unspecified atrial fibrillation: Secondary | ICD-10-CM | POA: Diagnosis not present

## 2022-09-07 DIAGNOSIS — M159 Polyosteoarthritis, unspecified: Secondary | ICD-10-CM | POA: Diagnosis not present

## 2022-09-07 DIAGNOSIS — E782 Mixed hyperlipidemia: Secondary | ICD-10-CM | POA: Diagnosis not present

## 2022-09-11 ENCOUNTER — Encounter: Payer: Self-pay | Admitting: Cardiovascular Disease
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
## 2022-09-12 ENCOUNTER — Encounter: Payer: Self-pay | Admitting: Cardiovascular Disease

## 2022-09-12 ENCOUNTER — Ambulatory Visit: Payer: Medicare Other | Attending: Cardiovascular Disease | Admitting: Cardiovascular Disease

## 2022-09-12 VITALS — BP 128/78 | HR 86 | Ht 62.0 in | Wt 219.0 lb

## 2022-09-12 DIAGNOSIS — I2510 Atherosclerotic heart disease of native coronary artery without angina pectoris: Secondary | ICD-10-CM | POA: Diagnosis not present

## 2022-09-12 DIAGNOSIS — I10 Essential (primary) hypertension: Secondary | ICD-10-CM | POA: Diagnosis not present

## 2022-09-12 DIAGNOSIS — Z951 Presence of aortocoronary bypass graft: Secondary | ICD-10-CM | POA: Diagnosis not present

## 2022-09-12 NOTE — Patient Instructions (Addendum)
Medication Instructions:  NO CHANGES *If you need a refill on your cardiac medications before your next appointment, please call your pharmacy*   Lab Work: NONE If you have labs (blood work) drawn today and your tests are completely normal, you will receive your results only by: [place] (if you have MyChart) OR A paper copy in the mail If you have any lab test that is abnormal or we need to change your treatment, we will call you to review the results.   Testing/Procedures: NONE   Follow-Up: At [facility], you and your health needs are our priority.  As part of our continuing mission to provide you with exceptional heart care, we have created designated Provider Care Teams.  These Care Teams include your primary Cardiologist (physician) and Advanced Practice Providers (APPs -  Physician Assistants and Nurse Practitioners) who all work together to provide you with the care you need, when you need it.  We recommend signing up for the patient portal called "MyChart".  Sign up information is provided on this After Visit Summary.  MyChart is used to connect with patients for Virtual Visits (Telemedicine).  Patients are able to view lab/test results, encounter notes, upcoming appointments, etc.  Non-urgent messages can be sent to your provider as well.   To learn more about what you can do with MyChart, go to [email or web address].    Your next appointment:   1 year(s)  Provider:   Mertie Moores, MD     Other Instructions NONE   For your  leg edema you  should do  the following 1. Leg elevation - I recommend the Lounge Dr. Leg rest.  See below for details  2. Salt restriction  -  Use potassium chloride instead of regular salt as a salt substitute. 3. Walk regularly 4. Compression hose - Medical Supply store  5. Weight loss    Available on [email or web address] Or  Go to [email or web address]

## 2022-09-13 DIAGNOSIS — H04129 Dry eye syndrome of unspecified lacrimal gland: Secondary | ICD-10-CM | POA: Diagnosis not present

## 2022-09-13 DIAGNOSIS — K449 Diaphragmatic hernia without obstruction or gangrene: Secondary | ICD-10-CM | POA: Diagnosis not present

## 2022-09-13 DIAGNOSIS — M064 Inflammatory polyarthropathy: Secondary | ICD-10-CM | POA: Diagnosis not present

## 2022-09-13 DIAGNOSIS — M159 Polyosteoarthritis, unspecified: Secondary | ICD-10-CM | POA: Diagnosis not present

## 2022-09-13 DIAGNOSIS — E039 Hypothyroidism, unspecified: Secondary | ICD-10-CM | POA: Diagnosis not present

## 2022-09-13 DIAGNOSIS — E11319 Type 2 diabetes mellitus with unspecified diabetic retinopathy without macular edema: Secondary | ICD-10-CM | POA: Diagnosis not present

## 2022-09-13 DIAGNOSIS — E1142 Type 2 diabetes mellitus with diabetic polyneuropathy: Secondary | ICD-10-CM | POA: Diagnosis not present

## 2022-09-13 DIAGNOSIS — M25551 Pain in right hip: Secondary | ICD-10-CM | POA: Diagnosis not present

## 2022-09-13 DIAGNOSIS — I110 Hypertensive heart disease with heart failure: Secondary | ICD-10-CM | POA: Diagnosis not present

## 2022-09-13 DIAGNOSIS — G4733 Obstructive sleep apnea (adult) (pediatric): Secondary | ICD-10-CM | POA: Diagnosis not present

## 2022-09-13 DIAGNOSIS — K219 Gastro-esophageal reflux disease without esophagitis: Secondary | ICD-10-CM | POA: Diagnosis not present

## 2022-09-13 DIAGNOSIS — Z7984 Long term (current) use of oral hypoglycemic drugs: Secondary | ICD-10-CM | POA: Diagnosis not present

## 2022-09-13 DIAGNOSIS — I2510 Atherosclerotic heart disease of native coronary artery without angina pectoris: Secondary | ICD-10-CM | POA: Diagnosis not present

## 2022-09-13 DIAGNOSIS — I252 Old myocardial infarction: Secondary | ICD-10-CM | POA: Diagnosis not present

## 2022-09-13 DIAGNOSIS — K5790 Diverticulosis of intestine, part unspecified, without perforation or abscess without bleeding: Secondary | ICD-10-CM | POA: Diagnosis not present

## 2022-09-13 DIAGNOSIS — H409 Unspecified glaucoma: Secondary | ICD-10-CM | POA: Diagnosis not present

## 2022-09-13 DIAGNOSIS — E782 Mixed hyperlipidemia: Secondary | ICD-10-CM | POA: Diagnosis not present

## 2022-09-13 DIAGNOSIS — G43909 Migraine, unspecified, not intractable, without status migrainosus: Secondary | ICD-10-CM | POA: Diagnosis not present

## 2022-09-13 DIAGNOSIS — Z9181 History of falling: Secondary | ICD-10-CM | POA: Diagnosis not present

## 2022-09-13 DIAGNOSIS — I509 Heart failure, unspecified: Secondary | ICD-10-CM | POA: Diagnosis not present

## 2022-09-13 DIAGNOSIS — D509 Iron deficiency anemia, unspecified: Secondary | ICD-10-CM | POA: Diagnosis not present

## 2022-09-13 DIAGNOSIS — I4891 Unspecified atrial fibrillation: Secondary | ICD-10-CM | POA: Diagnosis not present

## 2022-09-13 DIAGNOSIS — M810 Age-related osteoporosis without current pathological fracture: Secondary | ICD-10-CM | POA: Diagnosis not present

## 2022-09-15 DIAGNOSIS — I509 Heart failure, unspecified: Secondary | ICD-10-CM | POA: Diagnosis not present

## 2022-09-15 DIAGNOSIS — I4891 Unspecified atrial fibrillation: Secondary | ICD-10-CM | POA: Diagnosis not present

## 2022-09-15 DIAGNOSIS — I2510 Atherosclerotic heart disease of native coronary artery without angina pectoris: Secondary | ICD-10-CM | POA: Diagnosis not present

## 2022-09-15 DIAGNOSIS — E11319 Type 2 diabetes mellitus with unspecified diabetic retinopathy without macular edema: Secondary | ICD-10-CM | POA: Diagnosis not present

## 2022-09-15 DIAGNOSIS — K219 Gastro-esophageal reflux disease without esophagitis: Secondary | ICD-10-CM | POA: Diagnosis not present

## 2022-09-15 DIAGNOSIS — E1142 Type 2 diabetes mellitus with diabetic polyneuropathy: Secondary | ICD-10-CM | POA: Diagnosis not present

## 2022-09-15 DIAGNOSIS — M159 Polyosteoarthritis, unspecified: Secondary | ICD-10-CM | POA: Diagnosis not present

## 2022-09-15 DIAGNOSIS — E782 Mixed hyperlipidemia: Secondary | ICD-10-CM | POA: Diagnosis not present

## 2022-09-15 DIAGNOSIS — I110 Hypertensive heart disease with heart failure: Secondary | ICD-10-CM | POA: Diagnosis not present

## 2022-09-15 DIAGNOSIS — K449 Diaphragmatic hernia without obstruction or gangrene: Secondary | ICD-10-CM | POA: Diagnosis not present

## 2022-09-15 DIAGNOSIS — M064 Inflammatory polyarthropathy: Secondary | ICD-10-CM | POA: Diagnosis not present

## 2022-09-15 DIAGNOSIS — M25551 Pain in right hip: Secondary | ICD-10-CM | POA: Diagnosis not present

## 2022-09-15 NOTE — Telephone Encounter (Signed)
Pt ready for scheduling on or after 09/23/22  Out-of-pocket cost due at time of visit: $5  Primary: [place] co-insurance: 0% Admin fee co-insurance: $20   Deductible: does not apply  Secondary: [employer] Prolia co-insurance: 25% Admin fee co-insurance: 25%  Deductible: $50  Prior Auth: APPROVED PA# [id number] Valid: 09/15/22-09/16/23    ** This summary of benefits is an estimation of the patient's out-of-pocket cost. Exact cost may very based on individual plan coverage.

## 2022-09-15 NOTE — Telephone Encounter (Signed)
PA# [id number] Valid: 09/15/22-09/16/23

## 2022-09-15 NOTE — Telephone Encounter (Signed)
Prolia injection scheduled for 10/24/2022 at with office visit with Dr. Kelton Pillar per patient request.

## 2022-09-21 DIAGNOSIS — M159 Polyosteoarthritis, unspecified: Secondary | ICD-10-CM | POA: Diagnosis not present

## 2022-09-21 DIAGNOSIS — M25551 Pain in right hip: Secondary | ICD-10-CM | POA: Diagnosis not present

## 2022-09-21 DIAGNOSIS — E11319 Type 2 diabetes mellitus with unspecified diabetic retinopathy without macular edema: Secondary | ICD-10-CM | POA: Diagnosis not present

## 2022-09-21 DIAGNOSIS — E1142 Type 2 diabetes mellitus with diabetic polyneuropathy: Secondary | ICD-10-CM | POA: Diagnosis not present

## 2022-09-21 DIAGNOSIS — Z7902 Long term (current) use of antithrombotics/antiplatelets: Secondary | ICD-10-CM | POA: Diagnosis not present

## 2022-09-21 DIAGNOSIS — G43009 Migraine without aura, not intractable, without status migrainosus: Secondary | ICD-10-CM | POA: Diagnosis not present

## 2022-09-21 DIAGNOSIS — H409 Unspecified glaucoma: Secondary | ICD-10-CM | POA: Diagnosis not present

## 2022-09-21 DIAGNOSIS — K449 Diaphragmatic hernia without obstruction or gangrene: Secondary | ICD-10-CM | POA: Diagnosis not present

## 2022-09-21 DIAGNOSIS — E039 Hypothyroidism, unspecified: Secondary | ICD-10-CM | POA: Diagnosis not present

## 2022-09-21 DIAGNOSIS — I4891 Unspecified atrial fibrillation: Secondary | ICD-10-CM | POA: Diagnosis not present

## 2022-09-21 DIAGNOSIS — M064 Inflammatory polyarthropathy: Secondary | ICD-10-CM | POA: Diagnosis not present

## 2022-09-21 DIAGNOSIS — E782 Mixed hyperlipidemia: Secondary | ICD-10-CM | POA: Diagnosis not present

## 2022-09-21 DIAGNOSIS — I509 Heart failure, unspecified: Secondary | ICD-10-CM | POA: Diagnosis not present

## 2022-09-21 DIAGNOSIS — I2510 Atherosclerotic heart disease of native coronary artery without angina pectoris: Secondary | ICD-10-CM | POA: Diagnosis not present

## 2022-09-21 DIAGNOSIS — G4733 Obstructive sleep apnea (adult) (pediatric): Secondary | ICD-10-CM | POA: Diagnosis not present

## 2022-09-21 DIAGNOSIS — G4700 Insomnia, unspecified: Secondary | ICD-10-CM | POA: Diagnosis not present

## 2022-09-21 DIAGNOSIS — H04129 Dry eye syndrome of unspecified lacrimal gland: Secondary | ICD-10-CM | POA: Diagnosis not present

## 2022-09-21 DIAGNOSIS — I110 Hypertensive heart disease with heart failure: Secondary | ICD-10-CM | POA: Diagnosis not present

## 2022-09-21 DIAGNOSIS — D509 Iron deficiency anemia, unspecified: Secondary | ICD-10-CM | POA: Diagnosis not present

## 2022-09-21 DIAGNOSIS — M810 Age-related osteoporosis without current pathological fracture: Secondary | ICD-10-CM | POA: Diagnosis not present

## 2022-09-21 DIAGNOSIS — K5790 Diverticulosis of intestine, part unspecified, without perforation or abscess without bleeding: Secondary | ICD-10-CM | POA: Diagnosis not present

## 2022-09-21 DIAGNOSIS — K219 Gastro-esophageal reflux disease without esophagitis: Secondary | ICD-10-CM | POA: Diagnosis not present

## 2022-09-22 DIAGNOSIS — M810 Age-related osteoporosis without current pathological fracture: Secondary | ICD-10-CM | POA: Diagnosis not present

## 2022-09-22 DIAGNOSIS — I2510 Atherosclerotic heart disease of native coronary artery without angina pectoris: Secondary | ICD-10-CM | POA: Diagnosis not present

## 2022-09-22 DIAGNOSIS — I10 Essential (primary) hypertension: Secondary | ICD-10-CM | POA: Diagnosis not present

## 2022-09-22 DIAGNOSIS — I480 Paroxysmal atrial fibrillation: Secondary | ICD-10-CM | POA: Diagnosis not present

## 2022-09-22 DIAGNOSIS — G4700 Insomnia, unspecified: Secondary | ICD-10-CM | POA: Diagnosis not present

## 2022-09-22 DIAGNOSIS — E039 Hypothyroidism, unspecified: Secondary | ICD-10-CM | POA: Diagnosis not present

## 2022-09-22 DIAGNOSIS — K219 Gastro-esophageal reflux disease without esophagitis: Secondary | ICD-10-CM | POA: Diagnosis not present

## 2022-09-22 DIAGNOSIS — I509 Heart failure, unspecified: Secondary | ICD-10-CM | POA: Diagnosis not present

## 2022-09-27 DIAGNOSIS — M159 Polyosteoarthritis, unspecified: Secondary | ICD-10-CM | POA: Diagnosis not present

## 2022-09-27 DIAGNOSIS — I509 Heart failure, unspecified: Secondary | ICD-10-CM | POA: Diagnosis not present

## 2022-09-27 DIAGNOSIS — I4891 Unspecified atrial fibrillation: Secondary | ICD-10-CM | POA: Diagnosis not present

## 2022-09-27 DIAGNOSIS — D509 Iron deficiency anemia, unspecified: Secondary | ICD-10-CM | POA: Diagnosis not present

## 2022-09-27 DIAGNOSIS — K5790 Diverticulosis of intestine, part unspecified, without perforation or abscess without bleeding: Secondary | ICD-10-CM | POA: Diagnosis not present

## 2022-09-27 DIAGNOSIS — M25551 Pain in right hip: Secondary | ICD-10-CM | POA: Diagnosis not present

## 2022-09-27 DIAGNOSIS — M064 Inflammatory polyarthropathy: Secondary | ICD-10-CM | POA: Diagnosis not present

## 2022-09-27 DIAGNOSIS — E782 Mixed hyperlipidemia: Secondary | ICD-10-CM | POA: Diagnosis not present

## 2022-09-27 DIAGNOSIS — H04129 Dry eye syndrome of unspecified lacrimal gland: Secondary | ICD-10-CM | POA: Diagnosis not present

## 2022-09-27 DIAGNOSIS — I110 Hypertensive heart disease with heart failure: Secondary | ICD-10-CM | POA: Diagnosis not present

## 2022-09-27 DIAGNOSIS — Z7902 Long term (current) use of antithrombotics/antiplatelets: Secondary | ICD-10-CM | POA: Diagnosis not present

## 2022-09-27 DIAGNOSIS — I2510 Atherosclerotic heart disease of native coronary artery without angina pectoris: Secondary | ICD-10-CM | POA: Diagnosis not present

## 2022-09-27 DIAGNOSIS — G43009 Migraine without aura, not intractable, without status migrainosus: Secondary | ICD-10-CM | POA: Diagnosis not present

## 2022-09-27 DIAGNOSIS — G4700 Insomnia, unspecified: Secondary | ICD-10-CM | POA: Diagnosis not present

## 2022-09-27 DIAGNOSIS — H409 Unspecified glaucoma: Secondary | ICD-10-CM | POA: Diagnosis not present

## 2022-09-27 DIAGNOSIS — M810 Age-related osteoporosis without current pathological fracture: Secondary | ICD-10-CM | POA: Diagnosis not present

## 2022-09-27 DIAGNOSIS — K449 Diaphragmatic hernia without obstruction or gangrene: Secondary | ICD-10-CM | POA: Diagnosis not present

## 2022-09-27 DIAGNOSIS — E039 Hypothyroidism, unspecified: Secondary | ICD-10-CM | POA: Diagnosis not present

## 2022-09-27 DIAGNOSIS — E11319 Type 2 diabetes mellitus with unspecified diabetic retinopathy without macular edema: Secondary | ICD-10-CM | POA: Diagnosis not present

## 2022-09-27 DIAGNOSIS — G4733 Obstructive sleep apnea (adult) (pediatric): Secondary | ICD-10-CM | POA: Diagnosis not present

## 2022-09-27 DIAGNOSIS — E1142 Type 2 diabetes mellitus with diabetic polyneuropathy: Secondary | ICD-10-CM | POA: Diagnosis not present

## 2022-09-27 DIAGNOSIS — K219 Gastro-esophageal reflux disease without esophagitis: Secondary | ICD-10-CM | POA: Diagnosis not present

## 2022-10-04 DIAGNOSIS — I110 Hypertensive heart disease with heart failure: Secondary | ICD-10-CM | POA: Diagnosis not present

## 2022-10-04 DIAGNOSIS — E039 Hypothyroidism, unspecified: Secondary | ICD-10-CM | POA: Diagnosis not present

## 2022-10-04 DIAGNOSIS — H409 Unspecified glaucoma: Secondary | ICD-10-CM | POA: Diagnosis not present

## 2022-10-04 DIAGNOSIS — I4891 Unspecified atrial fibrillation: Secondary | ICD-10-CM | POA: Diagnosis not present

## 2022-10-04 DIAGNOSIS — I509 Heart failure, unspecified: Secondary | ICD-10-CM | POA: Diagnosis not present

## 2022-10-04 DIAGNOSIS — M064 Inflammatory polyarthropathy: Secondary | ICD-10-CM | POA: Diagnosis not present

## 2022-10-04 DIAGNOSIS — M25551 Pain in right hip: Secondary | ICD-10-CM | POA: Diagnosis not present

## 2022-10-04 DIAGNOSIS — E782 Mixed hyperlipidemia: Secondary | ICD-10-CM | POA: Diagnosis not present

## 2022-10-04 DIAGNOSIS — I2510 Atherosclerotic heart disease of native coronary artery without angina pectoris: Secondary | ICD-10-CM | POA: Diagnosis not present

## 2022-10-04 DIAGNOSIS — E11319 Type 2 diabetes mellitus with unspecified diabetic retinopathy without macular edema: Secondary | ICD-10-CM | POA: Diagnosis not present

## 2022-10-04 DIAGNOSIS — K449 Diaphragmatic hernia without obstruction or gangrene: Secondary | ICD-10-CM | POA: Diagnosis not present

## 2022-10-04 DIAGNOSIS — G4733 Obstructive sleep apnea (adult) (pediatric): Secondary | ICD-10-CM | POA: Diagnosis not present

## 2022-10-04 DIAGNOSIS — H04129 Dry eye syndrome of unspecified lacrimal gland: Secondary | ICD-10-CM | POA: Diagnosis not present

## 2022-10-04 DIAGNOSIS — Z7902 Long term (current) use of antithrombotics/antiplatelets: Secondary | ICD-10-CM | POA: Diagnosis not present

## 2022-10-04 DIAGNOSIS — G43009 Migraine without aura, not intractable, without status migrainosus: Secondary | ICD-10-CM | POA: Diagnosis not present

## 2022-10-04 DIAGNOSIS — G4700 Insomnia, unspecified: Secondary | ICD-10-CM | POA: Diagnosis not present

## 2022-10-04 DIAGNOSIS — D509 Iron deficiency anemia, unspecified: Secondary | ICD-10-CM | POA: Diagnosis not present

## 2022-10-04 DIAGNOSIS — M810 Age-related osteoporosis without current pathological fracture: Secondary | ICD-10-CM | POA: Diagnosis not present

## 2022-10-04 DIAGNOSIS — K5790 Diverticulosis of intestine, part unspecified, without perforation or abscess without bleeding: Secondary | ICD-10-CM | POA: Diagnosis not present

## 2022-10-04 DIAGNOSIS — K219 Gastro-esophageal reflux disease without esophagitis: Secondary | ICD-10-CM | POA: Diagnosis not present

## 2022-10-04 DIAGNOSIS — M159 Polyosteoarthritis, unspecified: Secondary | ICD-10-CM | POA: Diagnosis not present

## 2022-10-04 DIAGNOSIS — E1142 Type 2 diabetes mellitus with diabetic polyneuropathy: Secondary | ICD-10-CM | POA: Diagnosis not present

## 2022-10-11 DIAGNOSIS — M25551 Pain in right hip: Secondary | ICD-10-CM | POA: Diagnosis not present

## 2022-10-11 DIAGNOSIS — K5790 Diverticulosis of intestine, part unspecified, without perforation or abscess without bleeding: Secondary | ICD-10-CM | POA: Diagnosis not present

## 2022-10-11 DIAGNOSIS — G43009 Migraine without aura, not intractable, without status migrainosus: Secondary | ICD-10-CM | POA: Diagnosis not present

## 2022-10-11 DIAGNOSIS — M064 Inflammatory polyarthropathy: Secondary | ICD-10-CM | POA: Diagnosis not present

## 2022-10-11 DIAGNOSIS — I110 Hypertensive heart disease with heart failure: Secondary | ICD-10-CM | POA: Diagnosis not present

## 2022-10-11 DIAGNOSIS — E1142 Type 2 diabetes mellitus with diabetic polyneuropathy: Secondary | ICD-10-CM | POA: Diagnosis not present

## 2022-10-11 DIAGNOSIS — G4733 Obstructive sleep apnea (adult) (pediatric): Secondary | ICD-10-CM | POA: Diagnosis not present

## 2022-10-11 DIAGNOSIS — M810 Age-related osteoporosis without current pathological fracture: Secondary | ICD-10-CM | POA: Diagnosis not present

## 2022-10-11 DIAGNOSIS — E11319 Type 2 diabetes mellitus with unspecified diabetic retinopathy without macular edema: Secondary | ICD-10-CM | POA: Diagnosis not present

## 2022-10-11 DIAGNOSIS — Z7902 Long term (current) use of antithrombotics/antiplatelets: Secondary | ICD-10-CM | POA: Diagnosis not present

## 2022-10-11 DIAGNOSIS — I509 Heart failure, unspecified: Secondary | ICD-10-CM | POA: Diagnosis not present

## 2022-10-11 DIAGNOSIS — G4700 Insomnia, unspecified: Secondary | ICD-10-CM | POA: Diagnosis not present

## 2022-10-11 DIAGNOSIS — H409 Unspecified glaucoma: Secondary | ICD-10-CM | POA: Diagnosis not present

## 2022-10-11 DIAGNOSIS — I4891 Unspecified atrial fibrillation: Secondary | ICD-10-CM | POA: Diagnosis not present

## 2022-10-11 DIAGNOSIS — H04129 Dry eye syndrome of unspecified lacrimal gland: Secondary | ICD-10-CM | POA: Diagnosis not present

## 2022-10-11 DIAGNOSIS — I2510 Atherosclerotic heart disease of native coronary artery without angina pectoris: Secondary | ICD-10-CM | POA: Diagnosis not present

## 2022-10-11 DIAGNOSIS — E782 Mixed hyperlipidemia: Secondary | ICD-10-CM | POA: Diagnosis not present

## 2022-10-11 DIAGNOSIS — D509 Iron deficiency anemia, unspecified: Secondary | ICD-10-CM | POA: Diagnosis not present

## 2022-10-11 DIAGNOSIS — M159 Polyosteoarthritis, unspecified: Secondary | ICD-10-CM | POA: Diagnosis not present

## 2022-10-11 DIAGNOSIS — E039 Hypothyroidism, unspecified: Secondary | ICD-10-CM | POA: Diagnosis not present

## 2022-10-11 DIAGNOSIS — K449 Diaphragmatic hernia without obstruction or gangrene: Secondary | ICD-10-CM | POA: Diagnosis not present

## 2022-10-11 DIAGNOSIS — K219 Gastro-esophageal reflux disease without esophagitis: Secondary | ICD-10-CM | POA: Diagnosis not present

## 2022-10-19 DIAGNOSIS — H401132 Primary open-angle glaucoma, bilateral, moderate stage: Secondary | ICD-10-CM | POA: Diagnosis not present

## 2022-10-24 ENCOUNTER — Encounter: Payer: Self-pay | Admitting: Internal Medicine

## 2022-10-24 ENCOUNTER — Ambulatory Visit (INDEPENDENT_AMBULATORY_CARE_PROVIDER_SITE_OTHER): Payer: Medicare Other | Admitting: Internal Medicine

## 2022-10-24 ENCOUNTER — Other Ambulatory Visit: Payer: Self-pay | Admitting: Internal Medicine

## 2022-10-24 ENCOUNTER — Telehealth: Payer: Self-pay | Admitting: Internal Medicine

## 2022-10-24 VITALS — BP 116/74 | HR 86 | Ht 62.0 in | Wt 216.0 lb

## 2022-10-24 DIAGNOSIS — M810 Age-related osteoporosis without current pathological fracture: Secondary | ICD-10-CM | POA: Diagnosis not present

## 2022-10-24 DIAGNOSIS — E039 Hypothyroidism, unspecified: Secondary | ICD-10-CM | POA: Diagnosis not present

## 2022-10-24 DIAGNOSIS — I2510 Atherosclerotic heart disease of native coronary artery without angina pectoris: Secondary | ICD-10-CM | POA: Diagnosis not present

## 2022-10-24 DIAGNOSIS — G4700 Insomnia, unspecified: Secondary | ICD-10-CM | POA: Diagnosis not present

## 2022-10-24 DIAGNOSIS — K219 Gastro-esophageal reflux disease without esophagitis: Secondary | ICD-10-CM | POA: Diagnosis not present

## 2022-10-24 DIAGNOSIS — E1142 Type 2 diabetes mellitus with diabetic polyneuropathy: Secondary | ICD-10-CM | POA: Diagnosis not present

## 2022-10-24 DIAGNOSIS — I509 Heart failure, unspecified: Secondary | ICD-10-CM | POA: Diagnosis not present

## 2022-10-24 DIAGNOSIS — I480 Paroxysmal atrial fibrillation: Secondary | ICD-10-CM | POA: Diagnosis not present

## 2022-10-24 DIAGNOSIS — I10 Essential (primary) hypertension: Secondary | ICD-10-CM | POA: Diagnosis not present

## 2022-10-24 MED ORDER — VITAMIN D (ERGOCALCIFEROL) 1.25 MG (50000 UNIT) PO CAPS: 50000.0000 [IU] | ORAL_CAPSULE | ORAL | 3 refills | Status: DC

## 2022-10-24 MED ORDER — LEVOTHYROXINE SODIUM 75 MCG PO TABS: 75.0000 ug | ORAL_TABLET | Freq: Every day | ORAL | 3 refills | Status: DC

## 2022-10-24 MED ORDER — DENOSUMAB 60 MG/ML ~~LOC~~ SOSY
60.0000 mg | PREFILLED_SYRINGE | Freq: Once | SUBCUTANEOUS | Status: AC
  Administered 2022-10-24: 60 mg via SUBCUTANEOUS

## 2022-10-24 NOTE — Progress Notes (Signed)
After obtaining consent, and per orders of Dr. Kelton Pillar, injection of Prolia '60mg'$  given by Kendyll Huettner L Malaka Ruffner in right arm SQ. Patient instructed to remain in clinic for 20 minutes afterwards, and to report any adverse reaction to me immediately.

## 2022-10-24 NOTE — Addendum Note (Signed)
Addended by: Dorita Sciara on: 10/24/2022 10:59 AM   Modules accepted: Orders

## 2022-10-24 NOTE — Patient Instructions (Addendum)
Increase calcium tablets to 2 tablets in the morning and 2 tablets in the evening  Continue Ergocalciferol (vitamin D) 50,000 iu weekly  Continue Levothyroxine 75 mcg daily     Bone Density :   Address: [address], [place], [place] [address] Phone: [phone]

## 2022-10-24 NOTE — Progress Notes (Signed)
Name: Megan [name]  MRN/ DOB: [id number], 12/08/1945    Age/ Sex: 77 y.o., female    PCP: [name], MD   Reason for Endocrinology Evaluation: Clinical Osteoporosis      Date of Initial Endocrinology Evaluation: 01/01/2021    HPI: Megan [name] is a 77 y.o. female with a past medical history of T2DM, CAD, A. Fib . The patient presented for initial endocrinology clinic visit on 01/01/2021 for consultative assistance with her Osteoporosis     Pt was diagnosed with osteoporosis:09/2020  Menarche at age : 53 Menopausal at age : S/P hysterectomy 1992 Fracture Hx: left arm fracture 11/2019. S/P fall at home  Hx of HRT: no FH of osteoporosis or hip fracture: no Prior Hx of anti-estrogenic therapy : no  Prior Hx of anti-resorptive therapy : Received Prolia 01/20/2021   THYROID HISTORY: Has been on LT-4 replacement since 2019 . Takes it appropriately  Of note , she was noted to have low TSh at 0.33 uIU/mL during labs 08/2020   SUBJECTIVE:    Today (10/24/22): Megan [name] is here for follow-up on osteoporosis and hypothyroidism   Unfortunately she missed her 1/16th Prolia injection  Denies recent falls  or fractures  Continues with  chronic loose stools  Has transient nausea last week that she attributes to eating certain foods    Calcium 500  mg , takes 2 chewables once daily  Ergocalciferol 50,000 IUs weekly Thursdays  Prolia 60 mg SQ every 6 months Levothyroxine 75 mcg daily   HISTORY:  Past Medical History:  Past Medical History:  Diagnosis Date   Allergy    bee stings   Anxiety    Asthmatic bronchitis    Broken arm 1957/2008   right   Cataract    had surgery    Depression    Diabetes mellitus without complication ([place])    prediabetes diet controlled   Diverticulosis    GERD (gastroesophageal reflux disease)    Glaucoma    BIL   Hemorrhoids    Hx of adenomatous colonic polyps 12/02/2010   Hyperlipidemia    Hypertension     Hypothyroidism 05/2018   Inflammatory arthritis    Knee bursitis    Migraines    Obesity    Osteoarthritis    Sleep apnea    Past Surgical History:  Past Surgical History:  Procedure Laterality Date   CARPAL TUNNEL RELEASE Left    left wrist   CATARACT EXTRACTION W/ INTRAOCULAR LENS  IMPLANT, BILATERAL Bilateral    1999 left, 2008 right   COLONOSCOPY     CORONARY ARTERY BYPASS GRAFT N/A 05/15/2019   Procedure: CORONARY ARTERY BYPASS GRAFTING (CABG) times two, left internal mammary artery to left anterior descending artery and left greater saphenous vein to posterior descending artery, left sapheouns vein harvested endoscopically ;  Surgeon: Grace Isaac, MD;  Location: [facility];  Service: Open Heart Surgery;  Laterality: N/A;   EXCISION MORTON'S NEUROMA Left 1978   left foot   GLAUCOMA SURGERY Bilateral    and laser surgery, 2004 left, 2008 right   LEFT HEART CATH AND CORONARY ANGIOGRAPHY N/A 05/13/2019   Procedure: LEFT HEART CATH AND CORONARY ANGIOGRAPHY;  Surgeon: Martinique, Peter M, MD;  [facility];  Service: Cardiovascular;  Laterality: N/A;   ORIF RADIAL FRACTURE Left 12/13/2019   Procedure: OPEN REDUCTION INTERNAL FIXATION (ORIF) FOREARM FRACTURE;  Surgeon: Iran Planas, MD;  Location: [facility];  Service: Orthopedics;  Laterality:  Left;   PARTIAL HYSTERECTOMY  1991   Left an ovary   POLYPECTOMY     TEE WITHOUT CARDIOVERSION N/A 05/15/2019   Procedure: TRANSESOPHAGEAL ECHOCARDIOGRAM (TEE);  Surgeon: Grace Isaac, MD;  Location: [facility];  Service: Open Heart Surgery;  Laterality: N/A;   TOENAIL AVULSION Right    big toe    Social History:  reports that she quit smoking about 51 years ago. Her smoking use included cigarettes. She has never used smokeless tobacco. She reports that she does not drink alcohol and does not use drugs. Family History: family history includes Asthma in her brother and sister; Bone cancer in her maternal grandfather; Breast cancer in  her brother; COPD in her sister; Clotting disorder in her sister; Colon polyps in her maternal grandfather; Diabetes in her brother, father, mother, and sister; Heart Problems in her father; Hypertension in her father and mother; Obesity in an other family member; Parkinson's disease in her mother.   HOME MEDICATIONS: Allergies as of 10/24/2022       Reactions   Aspirin Anaphylaxis   Bee Pollen Swelling, Other (See Comments)   Extreme swelling due to bee stings   Codeine Nausea And Vomiting   Fish Oil Diarrhea   Sulfa Antibiotics Nausea And Vomiting   Hornet Venom Swelling, Other (See Comments)   Foot became swollen 3X it's normal size   Iron Other (See Comments)   Severe Headache   Lorazepam Other (See Comments)   Pantoprazole Diarrhea   Tape Other (See Comments)   "Tape leaves burn-like places and blisters"   Tramadol Hcl Other (See Comments)   Camphor Dermatitis   Lisinopril Cough   Penicillins Rash, Other (See Comments)   Swelling was of the    Sulfasalazine Nausea And Vomiting   Vicodin [hydrocodone-acetaminophen] Nausea And Vomiting        Medication List        Accurate as of October 24, 2022 10:00 AM. If you have any questions, ask your nurse or doctor.          STOP taking these medications    predniSONE 10 MG tablet Commonly known as: DELTASONE Stopped by: Dorita Sciara, MD       TAKE these medications    acetaminophen 650 MG CR tablet Commonly known as: TYLENOL Take 650 mg by mouth every 8 (eight) hours.   allopurinol 100 MG tablet Commonly known as: ZYLOPRIM Take 1 tablet (100 mg total) by mouth daily.   atorvastatin 80 MG tablet Commonly known as: LIPITOR Take 1 tablet (80 mg total) by mouth daily at 6 PM.   calcium carbonate 500 MG chewable tablet Commonly known as: TUMS - dosed in mg elemental calcium Chew 1-2 tablets by mouth as needed for indigestion or heartburn.   clopidogrel 75 MG tablet Commonly known as: PLAVIX Take 1  tablet (75 mg total) by mouth daily.   CVS B6 100 MG tablet Generic drug: pyridoxine Take 1 tablet by mouth at bedtime.   cyanocobalamin 1000 MCG tablet Commonly known as: VITAMIN B12 1 tab by orally daily   EPINEPHrine 0.3 mg/0.3 mL Soaj injection Commonly known as: EPI-PEN Inject 0.3 mg into the muscle once as needed for anaphylaxis.   famotidine 20 MG tablet Commonly known as: PEPCID Take 20 mg by mouth 2 (two) times daily.   furosemide 40 MG tablet Commonly known as: LASIX Take 1 tablet (40 mg total) by mouth 2 (two) times daily.   levothyroxine 75 MCG tablet Commonly known  as: SYNTHROID Take 1 tablet (75 mcg total) by mouth daily before breakfast.   loperamide 2 MG tablet Commonly known as: IMODIUM A-D 1 tablet as needed   LORazepam 0.5 MG tablet Commonly known as: ATIVAN Take 0.5 mg by mouth every 8 (eight) hours.   losartan 25 MG tablet Commonly known as: COZAAR Take 0.5 tablets (12.5 mg total) by mouth daily.   Magnesium Oxide 400 MG Caps Take 1 capsule (400 mg total) by mouth daily.   meclizine 25 MG tablet Commonly known as: ANTIVERT Take 25 mg by mouth 3 (three) times daily as needed for dizziness.   melatonin 3 MG Tabs tablet Take 1 tablet (3 mg total) by mouth at bedtime. FOR INSOMNIA   metFORMIN 750 MG 24 hr tablet Commonly known as: GLUCOPHAGE-XR Take 750 mg by mouth daily in the afternoon.   metoprolol tartrate 25 MG tablet Commonly known as: LOPRESSOR Take 0.5 tablets (12.5 mg total) by mouth 2 (two) times daily.   ondansetron 4 MG tablet Commonly known as: ZOFRAN Take 1 tablet (4 mg total) by mouth every 6 (six) hours as needed for nausea or vomiting. GIVE 1 TABLET BY MOUTH EVERY 6 HOURS AS NEEDED FOR NAUSEA/VOMITING   OneTouch Verio test strip Generic drug: glucose blood USE TO TEST BLOOD GLUCOSE ONCE DAILY   pantoprazole 20 MG tablet Commonly known as: PROTONIX Take 20 mg by mouth daily.   polyvinyl alcohol 1.4 % ophthalmic  solution Commonly known as: LIQUIFILM TEARS Place 1 drop into both eyes every 4 (four) hours as needed for dry eyes.   potassium chloride SA 20 MEQ tablet Commonly known as: KLOR-CON M Take 1 tablet by mouth once a week. On Wednesday   spironolactone 25 MG tablet Commonly known as: ALDACTONE Take 12.5 mg by mouth at bedtime.   traMADol 50 MG tablet Commonly known as: ULTRAM Take 50 mg by mouth 4 (four) times daily as needed.   Vitamin D (Ergocalciferol) 1.25 MG (50000 UNIT) Caps capsule Commonly known as: DRISDOL Take 50,000 Units by mouth every Thursday.          REVIEW OF SYSTEMS: A comprehensive ROS was conducted with the patient and is negative except as per HPI   OBJECTIVE:  VS: BP 116/74 (BP Location: Left Arm, Patient Position: Sitting, Cuff Size: Large)   Pulse 86   Ht '5\' 2"'$  (1.575 m)   Wt 216 lb (98 kg)   SpO2 96%   BMI 39.51 kg/m    Wt Readings from Last 3 Encounters:  10/24/22 216 lb (98 kg)  09/12/22 219 lb (99.3 kg)  05/27/22 228 lb 14.4 oz (103.8 kg)     EXAM: General: Pt appears well and is in NAD  Neck: General: Supple without adenopathy. Thyroid: Thyroid size normal.  Lungs: Clear with good BS bilat with no rales, rhonchi, or wheezes  Heart: Auscultation: RRR, + systolic murmur   Extremities: . BL LE: trace pretibial edema   Mental Status: Judgment, insight: Intact Orientation: Oriented to time, place, and person Mood and affect: No depression, anxiety, or agitation     DATA REVIEWED:  07/07/2022 BUN 26 Cr. 1.06 GFR 54 TSH 0.680    Latest Reference Range & Units 05/27/22 09:47  Sodium 135 - 145 mmol/L 140  Potassium 3.5 - 5.1 mmol/L 4.1  Chloride 98 - 111 mmol/L 107  CO2 22 - 32 mmol/L 25  Glucose 70 - 99 mg/dL 168 (H)  BUN 8 - 23 mg/dL 15  Creatinine 0.44 -  1.00 mg/dL 0.86  Calcium 8.9 - 10.3 mg/dL 7.2 (L)  Anion gap 5 - 15  8  Alkaline Phosphatase 38 - 126 U/L 124  Albumin 3.5 - 5.0 g/dL 3.9  AST 15 - 41 U/L 21  ALT 0  - 44 U/L 29  Total Protein 6.5 - 8.1 g/dL 7.0  Total Bilirubin 0.3 - 1.2 mg/dL 0.3  GFR, Est Non African American >60 mL/min >60    DXA 10/24/2020  AP LUMBAR SPINE L1 and L4   Bone Mineral Density (BMD):  1.011 g/cm2   Young Adult T-Score:  -0.2   Z-Score:  0.2   Left FEMUR neck   Bone Mineral Density (BMD):  0.629 g/cm2   Young Adult T-Score: -2.0   Z-Score:  0.1   Unit: This study was performed at [facility] on the [place] (S/N [phone]), software version 13.4.2.   Scan quality: The scan quality is good. Exclusions: L2 and L3 due to degenerative changes.   ASSESSMENT: Patient's diagnostic category is LOW BONE MASS by WHO Criteria.  ASSESSMENT/PLAN/RECOMMENDATIONS:   Osteoporosis :  -Despite DXA scan results consistent with low bone density, the patient meets criteria for clinical osteoporosis given fragility fracture of the left arm. -She is tolerating Prolia without any side effects, she received a Prolia injection today, we did discuss the importance of taking Prolia every 6 months as she was supposed to take her injection on 09/23/2022 but had to reschedule, I did explain to the patient that if there is no office visit she may schedule as a nurse visit for the Prolia injection only, and that way she would not have to wait longer to take the injection - She was started on Prolia in 12/2020 -She is due for DXA scan, will schedule this at [facility] as that is where she had a last time -Patient has been noted with hypocalcemia, I will increase her calcium as below   Medications : Increase calcium 2 chewable tablets twice daily Continue ergocalciferol 50,000 IUs weekly Continue  Prolia 60 mg SQ every 6 months    2.  Hypothyroid:  - Pt is clinically euthyroid  - TSh normal at PCP's office    Medications Continue levothyroxine 75 MCG daily     Follow-up in 1 yr     Signed electronically by: Mack Guise, MD  [facility]  Endocrinology  [address] [place], [place] [address] Phone: [phone] FAX: [phone]   CC: [name], MD [address] [place] [address] Phone: [phone] Fax: [phone]   Return to Endocrinology clinic as below: Future Appointments  Date Time Provider [place]  12/09/2022  1:00 PM [facility] None  12/09/2022  1:30 PM Brunetta Genera, MD [facility] None  12/12/2022  9:00 AM Gardiner Barefoot, DPM [facility]

## 2022-10-24 NOTE — Telephone Encounter (Signed)
Please schedule the pt for DXA at [facility] ?([facility])   Tuesday  and Thursdays mornings   Thanks

## 2022-11-08 ENCOUNTER — Telehealth: Payer: Self-pay | Admitting: Hematology

## 2022-11-08 NOTE — Telephone Encounter (Signed)
Called patient per provider on call to reschedule to next available. Left voicemail with new appointment information and contact details if needing to reschedule.

## 2022-11-12 NOTE — Telephone Encounter (Signed)
Last Prolia inj 10/24/22 Next Prolia inj due 04/25/23

## 2022-11-14 DIAGNOSIS — G4700 Insomnia, unspecified: Secondary | ICD-10-CM | POA: Diagnosis not present

## 2022-11-14 DIAGNOSIS — M810 Age-related osteoporosis without current pathological fracture: Secondary | ICD-10-CM | POA: Diagnosis not present

## 2022-11-14 DIAGNOSIS — I509 Heart failure, unspecified: Secondary | ICD-10-CM | POA: Diagnosis not present

## 2022-11-14 DIAGNOSIS — E782 Mixed hyperlipidemia: Secondary | ICD-10-CM | POA: Diagnosis not present

## 2022-11-14 DIAGNOSIS — E039 Hypothyroidism, unspecified: Secondary | ICD-10-CM | POA: Diagnosis not present

## 2022-11-14 DIAGNOSIS — I480 Paroxysmal atrial fibrillation: Secondary | ICD-10-CM | POA: Diagnosis not present

## 2022-11-14 DIAGNOSIS — K219 Gastro-esophageal reflux disease without esophagitis: Secondary | ICD-10-CM | POA: Diagnosis not present

## 2022-11-14 DIAGNOSIS — I10 Essential (primary) hypertension: Secondary | ICD-10-CM | POA: Diagnosis not present

## 2022-11-14 DIAGNOSIS — G43009 Migraine without aura, not intractable, without status migrainosus: Secondary | ICD-10-CM | POA: Diagnosis not present

## 2022-11-14 DIAGNOSIS — D509 Iron deficiency anemia, unspecified: Secondary | ICD-10-CM | POA: Diagnosis not present

## 2022-11-14 DIAGNOSIS — I2510 Atherosclerotic heart disease of native coronary artery without angina pectoris: Secondary | ICD-10-CM | POA: Diagnosis not present

## 2022-11-25 ENCOUNTER — Other Ambulatory Visit: Payer: Medicare Other

## 2022-11-25 ENCOUNTER — Ambulatory Visit: Payer: Medicare Other | Admitting: Oncology

## 2022-11-29 ENCOUNTER — Other Ambulatory Visit: Payer: Medicare Other

## 2022-11-30 DIAGNOSIS — G4700 Insomnia, unspecified: Secondary | ICD-10-CM | POA: Diagnosis not present

## 2022-11-30 DIAGNOSIS — G4733 Obstructive sleep apnea (adult) (pediatric): Secondary | ICD-10-CM | POA: Diagnosis not present

## 2022-11-30 DIAGNOSIS — I10 Essential (primary) hypertension: Secondary | ICD-10-CM | POA: Diagnosis not present

## 2022-12-06 DIAGNOSIS — Z1231 Encounter for screening mammogram for malignant neoplasm of breast: Secondary | ICD-10-CM | POA: Diagnosis not present

## 2022-12-09 ENCOUNTER — Other Ambulatory Visit: Payer: Medicare Other

## 2022-12-09 ENCOUNTER — Ambulatory Visit: Payer: Medicare Other | Admitting: Hematology

## 2022-12-12 ENCOUNTER — Encounter: Payer: Self-pay | Admitting: Podiatry

## 2022-12-12 ENCOUNTER — Ambulatory Visit (INDEPENDENT_AMBULATORY_CARE_PROVIDER_SITE_OTHER): Payer: Medicare Other | Admitting: Podiatry

## 2022-12-12 DIAGNOSIS — Q828 Other specified congenital malformations of skin: Secondary | ICD-10-CM | POA: Diagnosis not present

## 2022-12-12 DIAGNOSIS — M79674 Pain in right toe(s): Secondary | ICD-10-CM | POA: Diagnosis not present

## 2022-12-12 DIAGNOSIS — M79675 Pain in left toe(s): Secondary | ICD-10-CM | POA: Diagnosis not present

## 2022-12-12 DIAGNOSIS — E0842 Diabetes mellitus due to underlying condition with diabetic polyneuropathy: Secondary | ICD-10-CM | POA: Diagnosis not present

## 2022-12-12 DIAGNOSIS — B351 Tinea unguium: Secondary | ICD-10-CM

## 2022-12-12 NOTE — Progress Notes (Signed)
This patient returns to my office for at risk foot care.  This patient requires this care by a professional since this patient will be at risk due to having diabetes and chronic kidney disease.  This patient is unable to cut nails herself since the patient cannot reach her nails.These nails are painful walking and wearing shoes.  She has a painful callus under her big toe joint right foot.  This is painful walking and wearing her shoes.    This patient presents for at risk foot care today.  General Appearance  Alert, conversant and in no acute stress.  Vascular  Dorsalis pedis and posterior tibial  pulses are weakly  palpable  bilaterally.  Capillary return is within normal limits  bilaterally. Temperature is within normal limits  bilaterally.  Neurologic  Senn-Weinstein monofilament wire test within normal limits/diminished   bilaterally. Muscle power within normal limits bilaterally.  Nails Thick disfigured discolored nails with subungual debris  from hallux to fifth toes bilaterally. No evidence of bacterial infection or drainage bilaterally.  Orthopedic  No limitations of motion  feet .  No crepitus or effusions noted.  No bony pathology or digital deformities noted.  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  Red inflamed hot lower legs.  Skin  normotropic skin  noted bilaterally.  No signs of infections or ulcers noted.  Callus sub 1st MPJ right foot.   Onychomycosis  Pain in right toes  Pain in left toes  Callus sub 1 right foot.    Consent was obtained for treatment procedures.   Mechanical debridement of nails 1-5  bilaterally performed with a nail nipper.  Filed with dremel without incident.  Debridement of callus with dremel tool.  Told patient she may have a cellulitis in both lower legs.  To check with her doctor.   Return office visit  3 months                   Told patient to return for periodic foot care and evaluation due to potential at risk complications.   Helane Gunther DPM

## 2022-12-15 DIAGNOSIS — I8310 Varicose veins of unspecified lower extremity with inflammation: Secondary | ICD-10-CM | POA: Diagnosis not present

## 2022-12-15 DIAGNOSIS — R609 Edema, unspecified: Secondary | ICD-10-CM | POA: Diagnosis not present

## 2022-12-19 ENCOUNTER — Other Ambulatory Visit: Payer: Self-pay

## 2022-12-19 DIAGNOSIS — D509 Iron deficiency anemia, unspecified: Secondary | ICD-10-CM

## 2022-12-20 ENCOUNTER — Inpatient Hospital Stay: Payer: Medicare Other | Attending: Hematology

## 2022-12-20 ENCOUNTER — Inpatient Hospital Stay (HOSPITAL_BASED_OUTPATIENT_CLINIC_OR_DEPARTMENT_OTHER): Payer: Medicare Other | Admitting: Hematology

## 2022-12-20 VITALS — BP 147/64 | HR 86 | Temp 97.6°F | Resp 18 | Wt 220.3 lb

## 2022-12-20 DIAGNOSIS — I10 Essential (primary) hypertension: Secondary | ICD-10-CM | POA: Insufficient documentation

## 2022-12-20 DIAGNOSIS — Z803 Family history of malignant neoplasm of breast: Secondary | ICD-10-CM | POA: Insufficient documentation

## 2022-12-20 DIAGNOSIS — D509 Iron deficiency anemia, unspecified: Secondary | ICD-10-CM

## 2022-12-20 DIAGNOSIS — D649 Anemia, unspecified: Secondary | ICD-10-CM | POA: Diagnosis not present

## 2022-12-20 DIAGNOSIS — J45909 Unspecified asthma, uncomplicated: Secondary | ICD-10-CM | POA: Diagnosis not present

## 2022-12-20 DIAGNOSIS — Z87891 Personal history of nicotine dependence: Secondary | ICD-10-CM | POA: Diagnosis not present

## 2022-12-20 DIAGNOSIS — E039 Hypothyroidism, unspecified: Secondary | ICD-10-CM | POA: Diagnosis not present

## 2022-12-20 DIAGNOSIS — Z79899 Other long term (current) drug therapy: Secondary | ICD-10-CM | POA: Diagnosis not present

## 2022-12-20 DIAGNOSIS — E119 Type 2 diabetes mellitus without complications: Secondary | ICD-10-CM | POA: Insufficient documentation

## 2022-12-20 DIAGNOSIS — Z7984 Long term (current) use of oral hypoglycemic drugs: Secondary | ICD-10-CM | POA: Diagnosis not present

## 2022-12-20 DIAGNOSIS — H409 Unspecified glaucoma: Secondary | ICD-10-CM | POA: Insufficient documentation

## 2022-12-20 DIAGNOSIS — Z7902 Long term (current) use of antithrombotics/antiplatelets: Secondary | ICD-10-CM | POA: Insufficient documentation

## 2022-12-20 LAB — IRON AND IRON BINDING CAPACITY (CC-WL,HP ONLY)
Iron: 40 ug/dL (ref 28–170)
Saturation Ratios: 11 % (ref 10.4–31.8)
TIBC: 367 ug/dL (ref 250–450)
UIBC: 327 ug/dL (ref 148–442)

## 2022-12-20 LAB — CMP (CANCER CENTER ONLY)
ALT: 19 U/L (ref 0–44)
AST: 16 U/L (ref 15–41)
Albumin: 4.2 g/dL (ref 3.5–5.0)
Alkaline Phosphatase: 122 U/L (ref 38–126)
Anion gap: 8 (ref 5–15)
BUN: 27 mg/dL — ABNORMAL HIGH (ref 8–23)
CO2: 28 mmol/L (ref 22–32)
Calcium: 9.3 mg/dL (ref 8.9–10.3)
Chloride: 105 mmol/L (ref 98–111)
Creatinine: 1.05 mg/dL — ABNORMAL HIGH (ref 0.44–1.00)
GFR, Estimated: 55 mL/min — ABNORMAL LOW (ref >60)
Glucose, Bld: 176 mg/dL — ABNORMAL HIGH (ref 70–99)
Potassium: 4.4 mmol/L (ref 3.5–5.1)
Sodium: 141 mmol/L (ref 135–145)
Total Bilirubin: 0.3 mg/dL (ref 0.3–1.2)
Total Protein: 7.4 g/dL (ref 6.5–8.1)

## 2022-12-20 LAB — CBC WITH DIFFERENTIAL (CANCER CENTER ONLY)
Abs Immature Granulocytes: 0.03 10*3/uL (ref 0.00–0.07)
Basophils Absolute: 0.1 10*3/uL (ref 0.0–0.1)
Basophils Relative: 1 %
Eosinophils Absolute: 0.2 10*3/uL (ref 0.0–0.5)
Eosinophils Relative: 1 %
HCT: 38.8 % (ref 36.0–46.0)
Hemoglobin: 12.4 g/dL (ref 12.0–15.0)
Immature Granulocytes: 0 %
Lymphocytes Relative: 14 %
Lymphs Abs: 1.6 10*3/uL (ref 0.7–4.0)
MCH: 28.2 pg (ref 26.0–34.0)
MCHC: 32 g/dL (ref 30.0–36.0)
MCV: 88.4 fL (ref 80.0–100.0)
Monocytes Absolute: 0.5 10*3/uL (ref 0.1–1.0)
Monocytes Relative: 5 %
Neutro Abs: 8.7 10*3/uL — ABNORMAL HIGH (ref 1.7–7.7)
Neutrophils Relative %: 79 %
Platelet Count: 297 10*3/uL (ref 150–400)
RBC: 4.39 MIL/uL (ref 3.87–5.11)
RDW: 14.2 % (ref 11.5–15.5)
WBC Count: 11 10*3/uL — ABNORMAL HIGH (ref 4.0–10.5)
nRBC: 0 % (ref 0.0–0.2)

## 2022-12-20 LAB — FERRITIN: Ferritin: 19 ng/mL (ref 11–307)

## 2022-12-20 LAB — VITAMIN B12: Vitamin B-12: 2546 pg/mL — ABNORMAL HIGH (ref 180–914)

## 2022-12-20 NOTE — Progress Notes (Signed)
[name]    HEMATOLOGY/ONCOLOGY CLINIC NOTE  Date of Service: 12/20/22    Patient Care Team: Lupita Raider, MD as PCP - General (Family Medicine) Nahser, Deloris Ping, MD as PCP - Cardiology (Cardiology)  CHIEF COMPLAINTS/PURPOSE OF CONSULTATION:  Follow-up for continued evaluation and management of normocytic anemia. The HISTORY OF PRESENTING ILLNESS:  Megan [name] is a wonderful 77 y.o. female who has been referred to [name] by Dr .Lupita Raider, MD  for evaluation and management of normocytic anemia.  Patient has a h/o inflammatory arthritis, hypothyroidism, iron deficiency, HTN, DM2, glaucoma, asthma . Patient had recent labs with her PCP on 08/13/2018 which showed WBC 7.9k, HCT 30.4, MCV 86, platelets of 349k. Nl FT4 levels. Ferritin 32.  Interval History:   Megan [name] is here for continued evaluation and management of iron deficiency/multifactorial anemia.  She was last seen by me on 05/21/22 with some fatigue at that time.  Today, she states that she is doing okay overall but is still fatigued at times. This has improved somewhat. She reports that she has difficulty sleeping due to frequent "what if" thoughts keeping her awake. She has a visit with her PCP soon for her annual physical. She states that she gets out of her house to grocery shop and go to a nearby farm to walk and look at the flowers once weekly. Her sister live < 30 miles away and they talk on the phone to keep in touch.  She denies any leg swelling, abdominal pain, fever, chills, night sweats, shortness of breath, or chest pain.   MEDICAL HISTORY:  Past Medical History:  Diagnosis Date   Allergy    bee stings   Anxiety    Asthmatic bronchitis    Broken arm 1957/2008   right   Cataract    had surgery    Depression    Diabetes mellitus without complication    prediabetes diet controlled   Diverticulosis    GERD (gastroesophageal reflux disease)    Glaucoma    BIL   Hemorrhoids    Hx of  adenomatous colonic polyps 12/02/2010   Hyperlipidemia    Hypertension    Hypothyroidism 05/2018   Inflammatory arthritis    Knee bursitis    Migraines    Obesity    Osteoarthritis    Sleep apnea     SURGICAL HISTORY: Past Surgical History:  Procedure Laterality Date   CARPAL TUNNEL RELEASE Left    left wrist   CATARACT EXTRACTION W/ INTRAOCULAR LENS  IMPLANT, BILATERAL Bilateral    1999 left, 2008 right   COLONOSCOPY     CORONARY ARTERY BYPASS GRAFT N/A 05/15/2019   Procedure: CORONARY ARTERY BYPASS GRAFTING (CABG) times two, left internal mammary artery to left anterior descending artery and left greater saphenous vein to posterior descending artery, left sapheouns vein harvested endoscopically ;  Surgeon: Delight Ovens, MD;  Location: [facility] OR;  Service: Open Heart Surgery;  Laterality: N/A;   EXCISION MORTON'S NEUROMA Left 1978   left foot   GLAUCOMA SURGERY Bilateral    and laser surgery, 2004 left, 2008 right   LEFT HEART CATH AND CORONARY ANGIOGRAPHY N/A 05/13/2019   Procedure: LEFT HEART CATH AND CORONARY ANGIOGRAPHY;  Surgeon: [place]az[place], Peter M, MD;  Location: [facility] INVASIVE CV LAB;  Service: Cardiovascular;  Laterality: N/A;   ORIF RADIAL FRACTURE Left 12/13/2019   Procedure: OPEN REDUCTION INTERNAL FIXATION (ORIF) FOREARM FRACTURE;  Surgeon: Bradly Bienenstock, MD;  Location: [facility] OR;  Service: Orthopedics;  Laterality:  Left;   PARTIAL HYSTERECTOMY  1991   Left an ovary   POLYPECTOMY     TEE WITHOUT CARDIOVERSION N/A 05/15/2019   Procedure: TRANSESOPHAGEAL ECHOCARDIOGRAM (TEE);  Surgeon: Delight Ovens, MD;  Location: [facility] OR;  Service: Open Heart Surgery;  Laterality: N/A;   TOENAIL AVULSION Right    big toe    SOCIAL HISTORY: Social History   Socioeconomic History   Marital status: Widowed    Spouse name: Marita [place]   Number of children: 0   Years of education: Not on file   Highest education level: Associate degree: occupational, Scientist, product/process development, or vocational program   Occupational History   Occupation: retired  Tobacco Use   Smoking status: Former    Types: Cigarettes    Quit date: 08/30/1971    Years since quitting: 51.3   Smokeless tobacco: Never  Vaping Use   Vaping Use: Never used  Substance and Sexual Activity   Alcohol use: No    Alcohol/week: 0.0 standard drinks of alcohol   Drug use: No   Sexual activity: Not on file  Other Topics Concern   Not on file  Social History Narrative   She is widowed, no children, retired.  Lives in a one story home.  Retired from Technical brewer for [employer]   Social Determinants of Corporate investment banker Strain: Not on file  Food Insecurity: Not on file  Transportation Needs: Not on file  Physical Activity: Not on file  Stress: Not on file  Social Connections: Not on file  Intimate Partner Violence: Not on file    FAMILY HISTORY: Family History  Problem Relation Age of Onset   Colon polyps Maternal Grandfather    Bone cancer Maternal Grandfather    Hypertension Mother    Diabetes Mother    Parkinson's disease Mother    Hypertension Father    Diabetes Father    Heart Problems Father    Asthma Brother    Diabetes Brother    Breast cancer Brother        stage 4, both breast   Obesity Other    Diabetes Sister        x 2   COPD Sister    Asthma Sister    Clotting disorder Sister        blood count   Rectal cancer Neg Hx    Stomach cancer Neg Hx    Esophageal cancer Neg Hx     ALLERGIES:  is allergic to aspirin, bee pollen, codeine, fish oil, sulfa antibiotics, hornet venom, iron, lorazepam, pantoprazole, tape, tramadol hcl, camphor, lisinopril, penicillins, sulfasalazine, and vicodin [hydrocodone-acetaminophen].  MEDICATIONS:  Current Outpatient Medications  Medication Sig Dispense Refill   acetaminophen (TYLENOL) 650 MG CR tablet Take 650 mg by mouth every 8 (eight) hours.     allopurinol (ZYLOPRIM) 100 MG tablet Take 1 tablet (100 mg total) by mouth daily. 30 tablet 0    atorvastatin (LIPITOR) 80 MG tablet Take 1 tablet (80 mg total) by mouth daily at 6 PM. 30 tablet 2   calcium carbonate (TUMS - DOSED IN MG ELEMENTAL CALCIUM) 500 MG chewable tablet Chew 1-2 tablets by mouth as needed for indigestion or heartburn.     clopidogrel (PLAVIX) 75 MG tablet Take 1 tablet (75 mg total) by mouth daily. 30 tablet 2   CVS B6 100 MG tablet Take 1 tablet by mouth at bedtime.     EPINEPHrine 0.3 mg/0.3 mL IJ SOAJ injection Inject 0.3 mg into the muscle once  as needed for anaphylaxis.      famotidine (PEPCID) 20 MG tablet Take 20 mg by mouth 2 (two) times daily.     furosemide (LASIX) 40 MG tablet Take 1 tablet (40 mg total) by mouth 2 (two) times daily. 90 tablet 3   levothyroxine (SYNTHROID) 75 MCG tablet Take 1 tablet (75 mcg total) by mouth daily before breakfast. 90 tablet 3   loperamide (IMODIUM A-D) 2 MG tablet 1 tablet as needed     LORazepam (ATIVAN) 0.5 MG tablet Take 0.5 mg by mouth every 8 (eight) hours.     losartan (COZAAR) 25 MG tablet Take 0.5 tablets (12.5 mg total) by mouth daily. 45 tablet 3   Magnesium Oxide 400 MG CAPS Take 1 capsule (400 mg total) by mouth daily. 90 capsule 3   meclizine (ANTIVERT) 25 MG tablet Take 25 mg by mouth 3 (three) times daily as needed for dizziness.      Melatonin 3 MG TABS Take 1 tablet (3 mg total) by mouth at bedtime. FOR INSOMNIA 30 tablet 0   metFORMIN (GLUCOPHAGE-XR) 750 MG 24 hr tablet Take 750 mg by mouth daily in the afternoon.     metoprolol tartrate (LOPRESSOR) 25 MG tablet Take 0.5 tablets (12.5 mg total) by mouth 2 (two) times daily. 90 tablet 3   ondansetron (ZOFRAN) 4 MG tablet Take 1 tablet (4 mg total) by mouth every 6 (six) hours as needed for nausea or vomiting. GIVE 1 TABLET BY MOUTH EVERY 6 HOURS AS NEEDED FOR NAUSEA/VOMITING 20 tablet 0   ONETOUCH VERIO test strip USE TO TEST BLOOD GLUCOSE ONCE DAILY 100 each 0   pantoprazole (PROTONIX) 20 MG tablet Take 20 mg by mouth daily.     polyvinyl alcohol  (LIQUIFILM TEARS) 1.4 % ophthalmic solution Place 1 drop into both eyes every 4 (four) hours as needed for dry eyes. 15 mL 0   potassium chloride SA (KLOR-CON) 20 MEQ tablet Take 1 tablet by mouth once a week. On Wednesday     spironolactone (ALDACTONE) 25 MG tablet Take 12.5 mg by mouth at bedtime.      traMADol (ULTRAM) 50 MG tablet Take 50 mg by mouth 4 (four) times daily as needed.     vitamin B-12 (CYANOCOBALAMIN) 1000 MCG tablet 1 tab by orally daily 30 tablet 0   Vitamin D, Ergocalciferol, (DRISDOL) 1.25 MG (50000 UNIT) CAPS capsule Take 1 capsule (50,000 Units total) by mouth every Thursday. 12 capsule 3   No current facility-administered medications for this visit.    REVIEW OF SYSTEMS:   10 Point review of Systems was done is negative except as noted above.  PHYSICAL EXAMINATION: ECOG PERFORMANCE STATUS: 2 - Symptomatic, <50% confined to bed  Vitals:   12/20/22 1430  BP: (!) 147/64  Pulse: 86  Resp: 18  Temp: 97.6 F (36.4 C)  SpO2: 100%    Filed Weights   12/20/22 1430  Weight: 220 lb 4.8 oz (99.9 kg)   Body mass index is 40.29 kg/m.  GENERAL:alert, in no acute distress and comfortable SKIN: no acute rashes, no significant lesions EYES: conjunctiva are pink and non-injected, sclera anicteric OROPHARYNX: MMM, no exudates, no oropharyngeal erythema or ulceration NECK: supple, no JVD LYMPH:  no palpable lymphadenopathy in the cervical, axillary or inguinal regions LUNGS: clear to auscultation b/l with normal respiratory effort HEART: regular rate & rhythm ABDOMEN:  normoactive bowel sounds , non tender, not distended. Extremity: no pedal edema PSYCH: alert & oriented x 3 with  fluent speech NEURO: no focal motor/sensory deficits   LABORATORY DATA:  I have reviewed the data as listed    Latest Ref Rng & Units 12/20/2022    1:34 PM 05/27/2022    9:47 AM 05/28/2021   10:35 AM  CBC  WBC 4.0 - 10.5 K/uL 11.0  10.8  10.1   Hemoglobin 12.0 - 15.0 g/dL 87.5  64.3   32.9   Hematocrit 36.0 - 46.0 % 38.8  32.3  38.7   Platelets 150 - 400 K/uL 297  291  236       Latest Ref Rng & Units 12/20/2022    1:34 PM 05/27/2022    9:47 AM 05/28/2021   10:35 AM  CMP  Glucose 70 - 99 mg/dL 518  841  660   BUN 8 - 23 mg/dL 27  15  29    Creatinine 0.44 - 1.00 mg/dL 6.30  1.60  1.09   Sodium 135 - 145 mmol/L 141  140  142   Potassium 3.5 - 5.1 mmol/L 4.4  4.1  3.9   Chloride 98 - 111 mmol/L 105  107  103   CO2 22 - 32 mmol/L 28  25  26    Calcium 8.9 - 10.3 mg/dL 9.3  7.2  8.6   Total Protein 6.5 - 8.1 g/dL 7.4  7.0  7.3   Total Bilirubin 0.3 - 1.2 mg/dL 0.3  0.3  0.3   Alkaline Phos 38 - 126 U/L 122  124  135   AST 15 - 41 U/L 16  21  18    ALT 0 - 44 U/L 19  29  20     Lab Results  Component Value Date   IRON 40 12/20/2022   TIBC 367 12/20/2022   FERRITIN 19 12/20/2022     RADIOGRAPHIC STUDIES: I have personally reviewed the radiological images as listed and agreed with the findings in the report. No results found.    ASSESSMENT & PLAN:   77 y.o. female with   1) Normocytic Anemia Likely multifactorial.  But primarily seems to be be from iron deficiency at this time. Primarily anemia of chronic disease from b/l leg swelling and chronic venous stasis dermatitis. - patient recommended to f/u with PCP to optimize mx of her pedal edema and venous stasis dermatitis and wound care considerations. Some iron deficiency anemia Normal LDH - no evidence of hemolysis 09/04/18 Sed rate at 66 myeloma panel - no evidence of M spike   PLAN: -Labs done today, 12/20/22 were discussed in detail with the patient. -Patient's anemia seems to have improved and hemoglobin is up to 12.5 from a previous level of nearly 10.0. -Ferritin is slightly improved at 19 up from 7 but still low.  Iron saturation is up to 11% from 5%. -We will offer her the option for additional IV iron to build up her iron stores given significant fatigue.  Would recommend 1 additional dose of IV  Feraheme 510mg  to build up her iron stores. -CMP stable overall.  -B12 levels are adequate -Follow up with labs in 6 months   FOLLOW UP: -Return to clinic with Dr. Candise Che with labs in 6 months  .The total time spent in the appointment was 21 minutes* .  All of the patient's questions were answered with apparent satisfaction. The patient knows to call the clinic with any problems, questions or concerns.   Wyvonnia Lora MD MS AAHIVMS [facility] Hematology/Oncology Physician [name]  .*Total Encounter Time as  defined by the Centers for Medicare and Medicaid Services includes, in addition to the face-to-face time of a patient visit (documented in the note above) non-face-to-face time: obtaining and reviewing outside history, ordering and reviewing medications, tests or procedures, care coordination (communications with other health care professionals or caregivers) and documentation in the medical record.   I,Alexis Herring,acting as a Neurosurgeon for Wyvonnia Lora, MD.,have documented all relevant documentation on the behalf of Wyvonnia Lora, MD,as directed by  Wyvonnia Lora, MD while in the presence of Wyvonnia Lora, MD.  .I have reviewed the above documentation for accuracy and completeness, and I agree with the above. Johney Maine MD

## 2022-12-26 ENCOUNTER — Encounter: Payer: Self-pay | Admitting: Hematology

## 2023-01-05 DIAGNOSIS — I130 Hypertensive heart and chronic kidney disease with heart failure and stage 1 through stage 4 chronic kidney disease, or unspecified chronic kidney disease: Secondary | ICD-10-CM | POA: Diagnosis not present

## 2023-01-05 DIAGNOSIS — E1122 Type 2 diabetes mellitus with diabetic chronic kidney disease: Secondary | ICD-10-CM | POA: Diagnosis not present

## 2023-01-05 DIAGNOSIS — E039 Hypothyroidism, unspecified: Secondary | ICD-10-CM | POA: Diagnosis not present

## 2023-01-05 DIAGNOSIS — I2510 Atherosclerotic heart disease of native coronary artery without angina pectoris: Secondary | ICD-10-CM | POA: Diagnosis not present

## 2023-01-05 DIAGNOSIS — I872 Venous insufficiency (chronic) (peripheral): Secondary | ICD-10-CM | POA: Diagnosis not present

## 2023-01-05 DIAGNOSIS — I509 Heart failure, unspecified: Secondary | ICD-10-CM | POA: Diagnosis not present

## 2023-01-05 DIAGNOSIS — N183 Chronic kidney disease, stage 3 unspecified: Secondary | ICD-10-CM | POA: Diagnosis not present

## 2023-01-05 DIAGNOSIS — E1142 Type 2 diabetes mellitus with diabetic polyneuropathy: Secondary | ICD-10-CM | POA: Diagnosis not present

## 2023-01-05 DIAGNOSIS — I480 Paroxysmal atrial fibrillation: Secondary | ICD-10-CM | POA: Diagnosis not present

## 2023-01-05 LAB — LAB REPORT - SCANNED
A1c: 7.7
EGFR: 59

## 2023-02-28 DIAGNOSIS — I10 Essential (primary) hypertension: Secondary | ICD-10-CM | POA: Diagnosis not present

## 2023-02-28 DIAGNOSIS — G4700 Insomnia, unspecified: Secondary | ICD-10-CM | POA: Diagnosis not present

## 2023-02-28 DIAGNOSIS — G4733 Obstructive sleep apnea (adult) (pediatric): Secondary | ICD-10-CM | POA: Diagnosis not present

## 2023-03-08 NOTE — Telephone Encounter (Signed)
Prolia VOB initiated via [email or web address]   Next Prolia inj DUE: 04/25/23

## 2023-03-13 ENCOUNTER — Ambulatory Visit (INDEPENDENT_AMBULATORY_CARE_PROVIDER_SITE_OTHER): Payer: Medicare Other | Admitting: Podiatry

## 2023-03-13 DIAGNOSIS — M79674 Pain in right toe(s): Secondary | ICD-10-CM

## 2023-03-13 DIAGNOSIS — E0842 Diabetes mellitus due to underlying condition with diabetic polyneuropathy: Secondary | ICD-10-CM

## 2023-03-13 DIAGNOSIS — Q828 Other specified congenital malformations of skin: Secondary | ICD-10-CM

## 2023-03-13 DIAGNOSIS — B351 Tinea unguium: Secondary | ICD-10-CM

## 2023-03-13 DIAGNOSIS — M79675 Pain in left toe(s): Secondary | ICD-10-CM

## 2023-03-13 NOTE — Progress Notes (Signed)
This patient returns to my office for at risk foot care.  This patient requires this care by a professional since this patient will be at risk due to having diabetes and chronic kidney disease.  This patient is unable to cut nails herself since the patient cannot reach her nails.These nails are painful walking and wearing shoes.  She has a painful callus under her big toe joint right foot.  This is painful walking and wearing her shoes.    This patient presents for at risk foot care today.  General Appearance  Alert, conversant and in no acute stress.  Vascular  Dorsalis pedis and posterior tibial  pulses are weakly  palpable  bilaterally.  Capillary return is within normal limits  bilaterally. Temperature is within normal limits  bilaterally.  Neurologic  Senn-Weinstein monofilament wire test within normal limits/diminished   bilaterally. Muscle power within normal limits bilaterally.  Nails Thick disfigured discolored nails with subungual debris  from hallux to fifth toes bilaterally. No evidence of bacterial infection or drainage bilaterally.  Orthopedic  No limitations of motion  feet .  No crepitus or effusions noted.  No bony pathology or digital deformities noted.  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  Red inflamed hot lower legs.  Skin  normotropic skin  noted bilaterally.  No signs of infections or ulcers noted.  Callus sub 1st MPJ right foot.   Onychomycosis  Pain in right toes  Pain in left toes  Callus sub 1 right foot.    Consent was obtained for treatment procedures.   Mechanical debridement of nails 1-5  bilaterally performed with a nail nipper.  Filed with dremel without incident.  Debridement of callus with dremel tool.  Told patient she may have a cellulitis in both lower legs.  She never saw her doctor for her red inflamed lower leg.   Return office visit  3 months                   Told patient to return for periodic foot care and evaluation due to potential at risk complications.   Helane Gunther DPM

## 2023-03-30 NOTE — Telephone Encounter (Signed)
Prior Auth required for Prolia  PA PROCESS DETAILS: Please complete the prior authorization form located at [email or web address] or call [phone].

## 2023-03-31 NOTE — Telephone Encounter (Signed)
Prior Auth APPROVED PA# [id number]  Valid: 09/15/22-09/16/23

## 2023-03-31 NOTE — Telephone Encounter (Signed)
Pt ready for scheduling on or after 04/25/23  Out-of-pocket cost due at time of visit: $0  Primary: Medicare Prolia co-insurance: 20% (approximately $320) Admin fee co-insurance: 20% (approximately $25)  Deductible: $240 of $240 met  Secondary: [employer] Prolia co-insurance: Covers Medicare Part B co-insurance Admin fee co-insurance: Covers Medicare Part B co-insurance  Deductible:  Covered by secondary  Prior Auth: NOT required PA# Valid:   ** This summary of benefits is an estimation of the patient's out-of-pocket cost. Exact cost may vary based on individual plan coverage.

## 2023-04-03 NOTE — Telephone Encounter (Signed)
LMx1 on cell phone voice mail to call office to schedule next Prolia injection.

## 2023-04-05 NOTE — Telephone Encounter (Signed)
Patient is scheduled for 05/02/2023 at 10:00 AM for Prolia injection.

## 2023-04-26 DIAGNOSIS — Z961 Presence of intraocular lens: Secondary | ICD-10-CM | POA: Diagnosis not present

## 2023-04-26 DIAGNOSIS — H401132 Primary open-angle glaucoma, bilateral, moderate stage: Secondary | ICD-10-CM | POA: Diagnosis not present

## 2023-04-26 DIAGNOSIS — E119 Type 2 diabetes mellitus without complications: Secondary | ICD-10-CM | POA: Diagnosis not present

## 2023-04-26 DIAGNOSIS — H353112 Nonexudative age-related macular degeneration, right eye, intermediate dry stage: Secondary | ICD-10-CM | POA: Diagnosis not present

## 2023-04-26 DIAGNOSIS — H5203 Hypermetropia, bilateral: Secondary | ICD-10-CM | POA: Diagnosis not present

## 2023-05-02 ENCOUNTER — Encounter: Payer: Self-pay | Admitting: Hematology

## 2023-05-02 ENCOUNTER — Ambulatory Visit (INDEPENDENT_AMBULATORY_CARE_PROVIDER_SITE_OTHER): Payer: Medicare Other

## 2023-05-02 VITALS — BP 122/82 | HR 85 | Ht 62.0 in | Wt 213.0 lb

## 2023-05-02 DIAGNOSIS — M810 Age-related osteoporosis without current pathological fracture: Secondary | ICD-10-CM

## 2023-05-02 MED ORDER — DENOSUMAB 60 MG/ML ~~LOC~~ SOSY
60.0000 mg | PREFILLED_SYRINGE | Freq: Once | SUBCUTANEOUS | Status: AC
Start: 2023-05-02 — End: 2023-05-02
  Administered 2023-05-02: 60 mg via SUBCUTANEOUS

## 2023-05-02 NOTE — Progress Notes (Signed)
.  inj

## 2023-05-02 NOTE — Patient Instructions (Signed)
After obtaining consent, and per orders of Dr. Shamleffer, injection of Prolia 60mg given by Cayarel L Hendricks in right arm SQ. Patient instructed to remain in clinic for 20 minutes afterwards, and to report any adverse reaction to me immediately.  

## 2023-05-08 ENCOUNTER — Ambulatory Visit
Admission: RE | Admit: 2023-05-08 | Discharge: 2023-05-08 | Disposition: A | Discharge status: Home / Self Care | Payer: Medicare Other | Source: Ambulatory Visit | Attending: Internal Medicine | Admitting: Internal Medicine

## 2023-05-08 DIAGNOSIS — M8588 Other specified disorders of bone density and structure, other site: Secondary | ICD-10-CM | POA: Diagnosis not present

## 2023-05-08 DIAGNOSIS — M810 Age-related osteoporosis without current pathological fracture: Secondary | ICD-10-CM

## 2023-05-08 DIAGNOSIS — N189 Chronic kidney disease, unspecified: Secondary | ICD-10-CM | POA: Diagnosis not present

## 2023-05-08 DIAGNOSIS — E349 Endocrine disorder, unspecified: Secondary | ICD-10-CM | POA: Diagnosis not present

## 2023-05-09 ENCOUNTER — Other Ambulatory Visit: Payer: Self-pay

## 2023-05-09 MED ORDER — LEVOTHYROXINE SODIUM 75 MCG PO TABS: 75.0000 ug | ORAL_TABLET | Freq: Every day | ORAL | 3 refills | Status: DC

## 2023-05-20 NOTE — Telephone Encounter (Signed)
Pt received Prolia inj 05/02/23 Next Prolia inj due 10/31/23

## 2023-06-12 ENCOUNTER — Encounter: Payer: Self-pay | Admitting: Podiatry

## 2023-06-12 ENCOUNTER — Ambulatory Visit (INDEPENDENT_AMBULATORY_CARE_PROVIDER_SITE_OTHER): Payer: Medicare Other | Admitting: Podiatry

## 2023-06-12 DIAGNOSIS — M79674 Pain in right toe(s): Secondary | ICD-10-CM

## 2023-06-12 DIAGNOSIS — M79675 Pain in left toe(s): Secondary | ICD-10-CM | POA: Diagnosis not present

## 2023-06-12 DIAGNOSIS — B351 Tinea unguium: Secondary | ICD-10-CM

## 2023-06-12 DIAGNOSIS — E0842 Diabetes mellitus due to underlying condition with diabetic polyneuropathy: Secondary | ICD-10-CM | POA: Diagnosis not present

## 2023-06-12 NOTE — Progress Notes (Signed)
This patient returns to my office for at risk foot care.  This patient requires this care by a professional since this patient will be at risk due to having diabetes and chronic kidney disease.  This patient is unable to cut nails herself since the patient cannot reach her nails.These nails are painful walking and wearing shoes.  She has a painful callus under her big toe joint right foot.  This is painful walking and wearing her shoes.    This patient presents for at risk foot care today.  General Appearance  Alert, conversant and in no acute stress.  Vascular  Dorsalis pedis and posterior tibial  pulses are weakly  palpable  bilaterally.  Capillary return is within normal limits  bilaterally. Temperature is within normal limits  bilaterally.  Neurologic  Senn-Weinstein monofilament wire test within normal limits/diminished   bilaterally. Muscle power within normal limits bilaterally.  Nails Thick disfigured discolored nails with subungual debris  from hallux to fifth toes bilaterally. No evidence of bacterial infection or drainage bilaterally.  Orthopedic  No limitations of motion  feet .  No crepitus or effusions noted.  No bony pathology or digital deformities noted.  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  Red inflamed hot lower legs.  Skin  normotropic skin  noted bilaterally.  No signs of infections or ulcers noted.  Callus sub 1st MPJ right foot asymptomatic. Red swollen front both legs.    Onychomycosis  Pain in right toes  Pain in left toes  Callus sub 1 right foot.    Consent was obtained for treatment procedures.   Mechanical debridement of nails 1-5  bilaterally performed with a nail nipper.  Filed with dremel without incident.  Debridement of callus with dremel tool.     Return office visit  3 months                   Told patient to return for periodic foot care and evaluation due to potential at risk complications.   Helane Gunther DPM

## 2023-06-15 DIAGNOSIS — G4733 Obstructive sleep apnea (adult) (pediatric): Secondary | ICD-10-CM | POA: Diagnosis not present

## 2023-06-15 DIAGNOSIS — E1142 Type 2 diabetes mellitus with diabetic polyneuropathy: Secondary | ICD-10-CM | POA: Diagnosis not present

## 2023-06-15 DIAGNOSIS — E039 Hypothyroidism, unspecified: Secondary | ICD-10-CM | POA: Diagnosis not present

## 2023-06-15 DIAGNOSIS — I10 Essential (primary) hypertension: Secondary | ICD-10-CM | POA: Diagnosis not present

## 2023-06-20 ENCOUNTER — Other Ambulatory Visit: Payer: Self-pay

## 2023-06-20 DIAGNOSIS — D649 Anemia, unspecified: Secondary | ICD-10-CM

## 2023-06-21 ENCOUNTER — Ambulatory Visit: Payer: Medicare Other | Admitting: Hematology

## 2023-06-21 ENCOUNTER — Other Ambulatory Visit: Payer: Self-pay

## 2023-06-21 ENCOUNTER — Inpatient Hospital Stay: Payer: Medicare Other

## 2023-06-21 ENCOUNTER — Other Ambulatory Visit: Payer: Medicare Other

## 2023-06-21 ENCOUNTER — Inpatient Hospital Stay: Payer: Medicare Other | Attending: Hematology | Admitting: Hematology

## 2023-06-21 VITALS — BP 130/64 | HR 97 | Temp 98.1°F | Resp 18 | Wt 213.2 lb

## 2023-06-21 DIAGNOSIS — I872 Venous insufficiency (chronic) (peripheral): Secondary | ICD-10-CM | POA: Diagnosis not present

## 2023-06-21 DIAGNOSIS — E1122 Type 2 diabetes mellitus with diabetic chronic kidney disease: Secondary | ICD-10-CM | POA: Diagnosis not present

## 2023-06-21 DIAGNOSIS — D649 Anemia, unspecified: Secondary | ICD-10-CM

## 2023-06-21 DIAGNOSIS — Z808 Family history of malignant neoplasm of other organs or systems: Secondary | ICD-10-CM | POA: Diagnosis not present

## 2023-06-21 DIAGNOSIS — Z803 Family history of malignant neoplasm of breast: Secondary | ICD-10-CM | POA: Diagnosis not present

## 2023-06-21 DIAGNOSIS — N182 Chronic kidney disease, stage 2 (mild): Secondary | ICD-10-CM | POA: Diagnosis not present

## 2023-06-21 DIAGNOSIS — Z87891 Personal history of nicotine dependence: Secondary | ICD-10-CM | POA: Insufficient documentation

## 2023-06-21 DIAGNOSIS — D509 Iron deficiency anemia, unspecified: Secondary | ICD-10-CM | POA: Insufficient documentation

## 2023-06-21 DIAGNOSIS — E860 Dehydration: Secondary | ICD-10-CM | POA: Insufficient documentation

## 2023-06-21 LAB — CBC WITH DIFFERENTIAL (CANCER CENTER ONLY)
Abs Immature Granulocytes: 0.04 10*3/uL (ref 0.00–0.07)
Basophils Absolute: 0 10*3/uL (ref 0.0–0.1)
Basophils Relative: 0 %
Eosinophils Absolute: 0.1 10*3/uL (ref 0.0–0.5)
Eosinophils Relative: 1 %
HCT: 39.7 % (ref 36.0–46.0)
Hemoglobin: 12.7 g/dL (ref 12.0–15.0)
Immature Granulocytes: 0 %
Lymphocytes Relative: 14 %
Lymphs Abs: 1.5 10*3/uL (ref 0.7–4.0)
MCH: 25.8 pg — ABNORMAL LOW (ref 26.0–34.0)
MCHC: 32 g/dL (ref 30.0–36.0)
MCV: 80.7 fL (ref 80.0–100.0)
Monocytes Absolute: 0.5 10*3/uL (ref 0.1–1.0)
Monocytes Relative: 5 %
Neutro Abs: 9.1 10*3/uL — ABNORMAL HIGH (ref 1.7–7.7)
Neutrophils Relative %: 80 %
Platelet Count: 306 10*3/uL (ref 150–400)
RBC: 4.92 MIL/uL (ref 3.87–5.11)
RDW: 16.4 % — ABNORMAL HIGH (ref 11.5–15.5)
WBC Count: 11.3 10*3/uL — ABNORMAL HIGH (ref 4.0–10.5)
nRBC: 0 % (ref 0.0–0.2)

## 2023-06-21 LAB — CMP (CANCER CENTER ONLY)
ALT: 16 U/L (ref 0–44)
AST: 14 U/L — ABNORMAL LOW (ref 15–41)
Albumin: 4.2 g/dL (ref 3.5–5.0)
Alkaline Phosphatase: 127 U/L — ABNORMAL HIGH (ref 38–126)
Anion gap: 10 (ref 5–15)
BUN: 34 mg/dL — ABNORMAL HIGH (ref 8–23)
CO2: 24 mmol/L (ref 22–32)
Calcium: 9.7 mg/dL (ref 8.9–10.3)
Chloride: 103 mmol/L (ref 98–111)
Creatinine: 1.28 mg/dL — ABNORMAL HIGH (ref 0.44–1.00)
GFR, Estimated: 43 mL/min — ABNORMAL LOW (ref >60)
Glucose, Bld: 153 mg/dL — ABNORMAL HIGH (ref 70–99)
Potassium: 4.2 mmol/L (ref 3.5–5.1)
Sodium: 137 mmol/L (ref 135–145)
Total Bilirubin: 0.4 mg/dL (ref 0.3–1.2)
Total Protein: 7.5 g/dL (ref 6.5–8.1)

## 2023-06-21 LAB — FERRITIN: Ferritin: 8 ng/mL — ABNORMAL LOW (ref 11–307)

## 2023-06-21 LAB — IRON AND IRON BINDING CAPACITY (CC-WL,HP ONLY)
Iron: 42 ug/dL (ref 28–170)
Saturation Ratios: 10 % — ABNORMAL LOW (ref 10.4–31.8)
TIBC: 414 ug/dL (ref 250–450)
UIBC: 372 ug/dL (ref 148–442)

## 2023-06-21 LAB — VITAMIN B12: Vitamin B-12: 2862 pg/mL — ABNORMAL HIGH (ref 180–914)

## 2023-06-21 NOTE — Progress Notes (Signed)
[name]    HEMATOLOGY/ONCOLOGY CLINIC NOTE  Date of Service: 06/21/23    Patient Care Team: Lupita Raider, MD as PCP - General (Family Medicine) Nahser, Deloris Ping, MD as PCP - Cardiology (Cardiology)  CHIEF COMPLAINTS/PURPOSE OF CONSULTATION:  Follow-up for continued evaluation and management of normocytic anemia.  HISTORY OF PRESENTING ILLNESS:  Megan [name] is a wonderful 77 y.o. female who has been referred to [name] by Dr .Lupita Raider, MD  for evaluation and management of normocytic anemia.  Patient has a h/o inflammatory arthritis, hypothyroidism, iron deficiency, HTN, DM2, glaucoma, asthma . Patient had recent labs with her PCP on 08/13/2018 which showed WBC 7.9k, HCT 30.4, MCV 86, platelets of 349k. Nl FT4 levels. Ferritin 32.  Interval History:   Megan [name] is here for continued evaluation and management of iron deficiency/multifactorial anemia.  She was last seen by me on 12/20/2022 and complained of fatigue and sleeping difficulty due to racing thoughts.   Today, she reports that she has been using a CPAP machine for the last couple of years. Patient reports that this has somewhat improved her energy levels, though she continues to have sleep difficulties. She notes that she did previously use a CPAP machine 10 years ago, but had quit using it until more recently.   She reports that her Metformin was previously stopped due to diarrhea symptoms.   She reports that she has had some previous falls which caused fractures, and it weary of going up and down stairs and using treadmills at this time.   She started Jardiance 8 months ago and she has been taking a fluid pill since 2020.  She denies any abdominal pain or leg swelling.  She has lost 7 pounds since 12/20/2022 due to diet changes and increasing her activity. She reports that she has been incorporating more iron-rich foods in her diet to optimize her iron levels. Patient does not take any oral iron.   She  has not had an infusion since her last visit. Patient reports that in the past, IV iron did not improve her energy levels significantly. She did take oral iron which she reports she could not tolerate.   Patient reports that she has been having some unusual dreams recently. She reports that she has not had dreams previously. Patient denies any new medications. She generally drinks two cups of coffee in the mornings and no caffeine at night. She does need to use the restroom during the night.   MEDICAL HISTORY:  Past Medical History:  Diagnosis Date   Allergy    bee stings   Anxiety    Asthmatic bronchitis    Broken arm 1957/2008   right   Cataract    had surgery    Depression    Diabetes mellitus without complication (HCC)    prediabetes diet controlled   Diverticulosis    GERD (gastroesophageal reflux disease)    Glaucoma    BIL   Hemorrhoids    Hx of adenomatous colonic polyps 12/02/2010   Hyperlipidemia    Hypertension    Hypothyroidism 05/2018   Inflammatory arthritis    Knee bursitis    Migraines    Obesity    Osteoarthritis    Sleep apnea     SURGICAL HISTORY: Past Surgical History:  Procedure Laterality Date   CARPAL TUNNEL RELEASE Left    left wrist   CATARACT EXTRACTION W/ INTRAOCULAR LENS  IMPLANT, BILATERAL Bilateral    1999 left, 2008 right   COLONOSCOPY  CORONARY ARTERY BYPASS GRAFT N/A 05/15/2019   Procedure: CORONARY ARTERY BYPASS GRAFTING (CABG) times two, left internal mammary artery to left anterior descending artery and left greater saphenous vein to posterior descending artery, left sapheouns vein harvested endoscopically ;  Surgeon: Delight Ovens, MD;  Location: [facility] OR;  Service: Open Heart Surgery;  Laterality: N/A;   EXCISION MORTON'S NEUROMA Left 1978   left foot   GLAUCOMA SURGERY Bilateral    and laser surgery, 2004 left, 2008 right   LEFT HEART CATH AND CORONARY ANGIOGRAPHY N/A 05/13/2019   Procedure: LEFT HEART CATH AND CORONARY  ANGIOGRAPHY;  Surgeon: Swaz[place], Peter M, MD;  Location: [facility] INVASIVE CV LAB;  Service: Cardiovascular;  Laterality: N/A;   ORIF RADIAL FRACTURE Left 12/13/2019   Procedure: OPEN REDUCTION INTERNAL FIXATION (ORIF) FOREARM FRACTURE;  Surgeon: Bradly Bienenstock, MD;  Location: [facility] OR;  Service: Orthopedics;  Laterality: Left;   PARTIAL HYSTERECTOMY  1991   Left an ovary   POLYPECTOMY     TEE WITHOUT CARDIOVERSION N/A 05/15/2019   Procedure: TRANSESOPHAGEAL ECHOCARDIOGRAM (TEE);  Surgeon: Delight Ovens, MD;  Location: [facility] OR;  Service: Open Heart Surgery;  Laterality: N/A;   TOENAIL AVULSION Right    big toe    SOCIAL HISTORY: Social History   Socioeconomic History   Marital status: Widowed    Spouse name: Marita [place]   Number of children: 0   Years of education: Not on file   Highest education level: Associate degree: occupational, Scientist, product/process development, or vocational program  Occupational History   Occupation: retired  Tobacco Use   Smoking status: Former    Current packs/day: 0.00    Types: Cigarettes    Quit date: 08/30/1971    Years since quitting: 51.8   Smokeless tobacco: Never  Vaping Use   Vaping status: Never Used  Substance and Sexual Activity   Alcohol use: No    Alcohol/week: 0.0 standard drinks of alcohol   Drug use: No   Sexual activity: Not on file  Other Topics Concern   Not on file  Social History Narrative   She is widowed, no children, retired.  Lives in a one story home.  Retired from Technical brewer for [employer]   Social Determinants of Corporate investment banker Strain: Not on file  Food Insecurity: Not on file  Transportation Needs: Not on file  Physical Activity: Not on file  Stress: Not on file  Social Connections: Not on file  Intimate Partner Violence: Not on file    FAMILY HISTORY: Family History  Problem Relation Age of Onset   Colon polyps Maternal Grandfather    Bone cancer Maternal Grandfather    Hypertension Mother    Diabetes Mother    Parkinson's  disease Mother    Hypertension Father    Diabetes Father    Heart Problems Father    Asthma Brother    Diabetes Brother    Breast cancer Brother        stage 4, both breast   Obesity Other    Diabetes Sister        x 2   COPD Sister    Asthma Sister    Clotting disorder Sister        blood count   Rectal cancer Neg Hx    Stomach cancer Neg Hx    Esophageal cancer Neg Hx     ALLERGIES:  is allergic to aspirin, bee pollen, codeine, fish oil, sulfa antibiotics, hornet venom, iron, lorazepam, pantoprazole, tape, tramadol  hcl, camphor, lisinopril, penicillins, sulfasalazine, and vicodin [hydrocodone-acetaminophen].  MEDICATIONS:  Current Outpatient Medications  Medication Sig Dispense Refill   acetaminophen (TYLENOL) 650 MG CR tablet Take 650 mg by mouth every 8 (eight) hours.     allopurinol (ZYLOPRIM) 100 MG tablet Take 1 tablet (100 mg total) by mouth daily. 30 tablet 0   atorvastatin (LIPITOR) 80 MG tablet Take 1 tablet (80 mg total) by mouth daily at 6 PM. 30 tablet 2   calcium carbonate (TUMS - DOSED IN MG ELEMENTAL CALCIUM) 500 MG chewable tablet Chew 1-2 tablets by mouth as needed for indigestion or heartburn.     clopidogrel (PLAVIX) 75 MG tablet Take 1 tablet (75 mg total) by mouth daily. 30 tablet 2   CVS B6 100 MG tablet Take 1 tablet by mouth at bedtime.     EPINEPHrine 0.3 mg/0.3 mL IJ SOAJ injection Inject 0.3 mg into the muscle once as needed for anaphylaxis.      famotidine (PEPCID) 20 MG tablet Take 20 mg by mouth 2 (two) times daily.     furosemide (LASIX) 40 MG tablet Take 1 tablet (40 mg total) by mouth 2 (two) times daily. 90 tablet 3   levothyroxine (SYNTHROID) 75 MCG tablet Take 1 tablet (75 mcg total) by mouth daily before breakfast. 90 tablet 3   loperamide (IMODIUM A-D) 2 MG tablet 1 tablet as needed     LORazepam (ATIVAN) 0.5 MG tablet Take 0.5 mg by mouth every 8 (eight) hours.     losartan (COZAAR) 25 MG tablet Take 0.5 tablets (12.5 mg total) by mouth  daily. 45 tablet 3   Magnesium Oxide 400 MG CAPS Take 1 capsule (400 mg total) by mouth daily. 90 capsule 3   meclizine (ANTIVERT) 25 MG tablet Take 25 mg by mouth 3 (three) times daily as needed for dizziness.      Melatonin 3 MG TABS Take 1 tablet (3 mg total) by mouth at bedtime. FOR INSOMNIA 30 tablet 0   metFORMIN (GLUCOPHAGE-XR) 750 MG 24 hr tablet Take 750 mg by mouth daily in the afternoon.     metoprolol tartrate (LOPRESSOR) 25 MG tablet Take 0.5 tablets (12.5 mg total) by mouth 2 (two) times daily. 90 tablet 3   ondansetron (ZOFRAN) 4 MG tablet Take 1 tablet (4 mg total) by mouth every 6 (six) hours as needed for nausea or vomiting. GIVE 1 TABLET BY MOUTH EVERY 6 HOURS AS NEEDED FOR NAUSEA/VOMITING 20 tablet 0   ONETOUCH VERIO test strip USE TO TEST BLOOD GLUCOSE ONCE DAILY 100 each 0   pantoprazole (PROTONIX) 20 MG tablet Take 20 mg by mouth daily.     polyvinyl alcohol (LIQUIFILM TEARS) 1.4 % ophthalmic solution Place 1 drop into both eyes every 4 (four) hours as needed for dry eyes. 15 mL 0   potassium chloride SA (KLOR-CON) 20 MEQ tablet Take 1 tablet by mouth once a week. On Wednesday     Semaglutide (RYBELSUS) 7 MG TABS Take 7 mg by mouth daily.     spironolactone (ALDACTONE) 25 MG tablet Take 25 mg by mouth at bedtime.     traMADol (ULTRAM) 50 MG tablet Take 50 mg by mouth 4 (four) times daily as needed.     vitamin B-12 (CYANOCOBALAMIN) 1000 MCG tablet 1 tab by orally daily 30 tablet 0   Vitamin D, Ergocalciferol, (DRISDOL) 1.25 MG (50000 UNIT) CAPS capsule Take 1 capsule (50,000 Units total) by mouth every Thursday. 12 capsule 3   No current  facility-administered medications for this visit.    REVIEW OF SYSTEMS:    10 Point review of Systems was done is negative except as noted above.   PHYSICAL EXAMINATION: ECOG PERFORMANCE STATUS: 2 - Symptomatic, <50% confined to bed  Vitals:   06/21/23 0949  BP: 130/64  Pulse: 97  Resp: 18  Temp: 98.1 F (36.7 C)  SpO2: 97%      Filed Weights   06/21/23 0949  Weight: 213 lb 3.2 oz (96.7 kg)    Body mass index is 38.99 kg/m.   GENERAL:alert, in no acute distress and comfortable SKIN: no acute rashes, no significant lesions EYES: conjunctiva are pink and non-injected, sclera anicteric OROPHARYNX: MMM, no exudates, no oropharyngeal erythema or ulceration NECK: supple, no JVD LYMPH:  no palpable lymphadenopathy in the cervical, axillary or inguinal regions LUNGS: clear to auscultation b/l with normal respiratory effort HEART: regular rate & rhythm ABDOMEN:  normoactive bowel sounds , non tender, not distended. Extremity: no pedal edema PSYCH: alert & oriented x 3 with fluent speech NEURO: no focal motor/sensory deficits    LABORATORY DATA:  I have reviewed the data as listed    Latest Ref Rng & Units 06/21/2023    9:37 AM 12/20/2022    1:34 PM 05/27/2022    9:47 AM  CBC  WBC 4.0 - 10.5 K/uL 11.3  11.0  10.8   Hemoglobin 12.0 - 15.0 g/dL 16.1  09.6  04.5   Hematocrit 36.0 - 46.0 % 39.7  38.8  32.3   Platelets 150 - 400 K/uL 306  297  291       Latest Ref Rng & Units 06/21/2023    9:37 AM 12/20/2022    1:34 PM 05/27/2022    9:47 AM  CMP  Glucose 70 - 99 mg/dL 409  811  914   BUN 8 - 23 mg/dL 34  27  15   Creatinine 0.44 - 1.00 mg/dL 7.82  9.56  2.13   Sodium 135 - 145 mmol/L 137  141  140   Potassium 3.5 - 5.1 mmol/L 4.2  4.4  4.1   Chloride 98 - 111 mmol/L 103  105  107   CO2 22 - 32 mmol/L 24  28  25    Calcium 8.9 - 10.3 mg/dL 9.7  9.3  7.2   Total Protein 6.5 - 8.1 g/dL 7.5  7.4  7.0   Total Bilirubin 0.3 - 1.2 mg/dL 0.4  0.3  0.3   Alkaline Phos 38 - 126 U/L 127  122  124   AST 15 - 41 U/L 14  16  21    ALT 0 - 44 U/L 16  19  29     Lab Results  Component Value Date   IRON 42 06/21/2023   TIBC 414 06/21/2023   FERRITIN 8 (L) 06/21/2023     RADIOGRAPHIC STUDIES: I have personally reviewed the radiological images as listed and agreed with the findings in the report. No results  found.    ASSESSMENT & PLAN:   77 y.o. female with   1) Normocytic Anemia Likely multifactorial.  But primarily seems to be be from iron deficiency at this time. Primarily anemia of chronic disease from b/l leg swelling and chronic venous stasis dermatitis. - patient recommended to f/u with PCP to optimize mx of her pedal edema and venous stasis dermatitis and wound care considerations. Some iron deficiency anemia Normal LDH - no evidence of hemolysis 09/04/18 Sed rate at 66 myeloma panel -  no evidence of M spike   PLAN:  -Discussed lab results on 06/21/23 in detail with patient. CBC normal, showed WBC consistently slightly elevated 11.3K, hemoglobin of 12.7, and platelets of 306K. -no anemia at this time -CMP shows mild chronic CKD and dehydration -Iron saturation level improved to 10%. Discussed goal of 20%.  -ferritin still low at 8 and B12 labs are wnl -RDW is mildly high and RBCs are smaller in size; patient is beginning to get iron deficient  -pt has not previously tolerated oral iron -will set up patient for IV iron, so that iron is not a limiting factor in her energy levels and blood counts -advised patient to stay well hydrated -recommend drinking more water in early parts of the day  -educated patient that Jardiance and fluid pills may have a role in causing dehydration and there may be a role to balance these medications with prescribing physician if patient is still dehydrated despite increasing water intake -she reportedly has an upcoming appt with PCP in Nov/Dec and shall have her iron levels monitored at that time -patient shall RTC with [name] in 1 year -answered all of patient's questions in detail  FOLLOW UP: -IV monoferric 1000mg  x 1 dose at [address] [facility] -Return to clinic with Dr. Candise Che with labs in 6 months  The total time spent in the appointment was 23 minutes* .  All of the patient's questions were answered with apparent satisfaction. The  patient knows to call the clinic with any problems, questions or concerns.   Wyvonnia Lora MD MS AAHIVMS [facility] Hematology/Oncology [facility]  .*Total Encounter Time as defined by the Centers for Medicare and Medicaid Services includes, in addition to the face-to-face time of a patient visit (documented in the note above) non-face-to-face time: obtaining and reviewing outside history, ordering and reviewing medications, tests or procedures, care coordination (communications with other health care professionals or caregivers) and documentation in the medical record.    I,Mitra Faeizi,acting as a Neurosurgeon for Wyvonnia Lora, MD.,have documented all relevant documentation on the behalf of Wyvonnia Lora, MD,as directed by  Wyvonnia Lora, MD while in the presence of Wyvonnia Lora, MD.  .I have reviewed the above documentation for accuracy and completeness, and I agree with the above. Johney Maine MD

## 2023-06-23 ENCOUNTER — Telehealth: Payer: Self-pay | Admitting: Hematology

## 2023-06-23 NOTE — Telephone Encounter (Signed)
Per Candise Che 10/23 los patient is aware of scheduled appointment times/dates

## 2023-06-27 ENCOUNTER — Encounter: Payer: Self-pay | Admitting: Hematology

## 2023-07-06 ENCOUNTER — Encounter: Payer: Self-pay | Admitting: Internal Medicine

## 2023-07-11 ENCOUNTER — Telehealth: Payer: Self-pay | Admitting: Pharmacy Technician

## 2023-07-11 ENCOUNTER — Other Ambulatory Visit: Payer: Self-pay | Admitting: Hematology

## 2023-07-11 NOTE — Telephone Encounter (Addendum)
Dr. Candise Che / Leda Min has been re-submitted patient has failed x2 preferred medications.    Auth Submission: APPROVED Site of care: Site of care: [facility] Payer: [employer] Medication & CPT/J Code(s) submitted:  MONOFERRIC - [id number] Route of submission (phone, fax, portal):  Phone # Fax # Auth type: Buy/Bill PB Units/visits requested:  Reference number [id number] Approval from 07/12/23 - 07/11/24

## 2023-07-14 ENCOUNTER — Encounter: Payer: Self-pay | Admitting: Hematology

## 2023-07-14 DIAGNOSIS — E1142 Type 2 diabetes mellitus with diabetic polyneuropathy: Secondary | ICD-10-CM | POA: Diagnosis not present

## 2023-07-14 DIAGNOSIS — I130 Hypertensive heart and chronic kidney disease with heart failure and stage 1 through stage 4 chronic kidney disease, or unspecified chronic kidney disease: Secondary | ICD-10-CM | POA: Diagnosis not present

## 2023-07-14 DIAGNOSIS — Z23 Encounter for immunization: Secondary | ICD-10-CM | POA: Diagnosis not present

## 2023-07-14 DIAGNOSIS — N183 Chronic kidney disease, stage 3 unspecified: Secondary | ICD-10-CM | POA: Diagnosis not present

## 2023-07-14 DIAGNOSIS — M064 Inflammatory polyarthropathy: Secondary | ICD-10-CM | POA: Diagnosis not present

## 2023-07-14 DIAGNOSIS — E782 Mixed hyperlipidemia: Secondary | ICD-10-CM | POA: Diagnosis not present

## 2023-07-14 DIAGNOSIS — E039 Hypothyroidism, unspecified: Secondary | ICD-10-CM | POA: Diagnosis not present

## 2023-07-14 DIAGNOSIS — Z0000 Encounter for general adult medical examination without abnormal findings: Secondary | ICD-10-CM | POA: Diagnosis not present

## 2023-07-14 DIAGNOSIS — M159 Polyosteoarthritis, unspecified: Secondary | ICD-10-CM | POA: Diagnosis not present

## 2023-07-14 DIAGNOSIS — E1122 Type 2 diabetes mellitus with diabetic chronic kidney disease: Secondary | ICD-10-CM | POA: Diagnosis not present

## 2023-07-14 DIAGNOSIS — I2510 Atherosclerotic heart disease of native coronary artery without angina pectoris: Secondary | ICD-10-CM | POA: Diagnosis not present

## 2023-07-14 DIAGNOSIS — I509 Heart failure, unspecified: Secondary | ICD-10-CM | POA: Diagnosis not present

## 2023-07-14 LAB — LAB REPORT - SCANNED
A1c: 7.5
EGFR: 45

## 2023-07-17 ENCOUNTER — Telehealth: Payer: Self-pay | Admitting: Pharmacy Technician

## 2023-07-17 NOTE — Telephone Encounter (Addendum)
Dr. Candise Che / Waynetta Sandy Fyi note:  patient will be scheduled as soon as possible.  Auth Submission: APPROVED Site of care: Site of care: [facility] Payer:  [employer] Medication & CPT/J Code(s) submitted: Monoferric (Ferrci derisomaltose) [phone] Route of submission (phone, fax, portal):  Phone # Fax # Auth type: Buy/Bill PB Units/visits requested: X1 (PRN) Reference number: [id number] Approval from: 07/12/23 to 07/11/24

## 2023-07-25 ENCOUNTER — Ambulatory Visit
Admission: EM | Admit: 2023-07-25 | Discharge: 2023-07-25 | Disposition: A | Discharge status: Home / Self Care | Payer: Medicare Other | Attending: Family Medicine | Admitting: Family Medicine

## 2023-07-25 DIAGNOSIS — M109 Gout, unspecified: Secondary | ICD-10-CM | POA: Diagnosis not present

## 2023-07-25 DIAGNOSIS — M1A9XX0 Chronic gout, unspecified, without tophus (tophi): Secondary | ICD-10-CM | POA: Diagnosis not present

## 2023-07-25 MED ORDER — ALLOPURINOL 100 MG PO TABS: 50.0000 mg | ORAL_TABLET | Freq: Every day | ORAL | 0 refills | Status: AC

## 2023-07-25 NOTE — Discharge Instructions (Addendum)
Take 1/2 tablet of Allopurinol daily for 2 weeks for gout flare.  Drink plenty of water.  Return here or follow-up with PCP if symptoms worsen or do not improve.

## 2023-07-25 NOTE — ED Provider Notes (Signed)
[name]    CSN: [id number] Arrival date & time: 07/25/23  0902      History   Chief Complaint Chief Complaint  Patient presents with   Ankle Pain   Toe Pain    HPI Megan [name] is a 77 y.o. female, complicated medical history significant for type 2 diabetes, CKD 3, iron-deficiency anemia, osteoarthritis, cardiovascular disease history of stent placement. HPI Patient presents today with a 6-day history of left great toe and left ankle pain.  Patient reports pain is exacerbated by anything touching the toe or ankle.  She endorses swelling.  Patient has experienced gout approximately 5 years ago and was treated with allopurinol and reports symptoms had not returned until recently.  She endorses current level of pain as similar to previous bouts of gout. Denies any injury.  Patient walks with a cane at baseline.  Denies any further concerns.  Past Medical History:  Diagnosis Date   Allergy    bee stings   Anxiety    Asthmatic bronchitis    Broken arm 1957/2008   right   Cataract    had surgery    Depression    Diabetes mellitus without complication (HCC)    prediabetes diet controlled   Diverticulosis    GERD (gastroesophageal reflux disease)    Glaucoma    BIL   Hemorrhoids    Hx of adenomatous colonic polyps 12/02/2010   Hyperlipidemia    Hypertension    Hypothyroidism 05/2018   Inflammatory arthritis    Knee bursitis    Migraines    Obesity    Osteoarthritis    Sleep apnea     Patient Active Problem List   Diagnosis Date Noted   Constipation 10/21/2020   Decreased estrogen level 10/21/2020   Diabetic retinopathy associated with type 2 diabetes mellitus (HCC) 10/21/2020   Diverticular disease of colon 10/21/2020   Glaucoma 10/21/2020   Hearing loss 10/21/2020   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   Migraine without aura, not refractory 10/21/2020   Mixed hyperlipidemia 10/21/2020   Osteoarthritis 10/21/2020   Peripheral  neuropathy 10/21/2020   Peripheral venous insufficiency 10/21/2020   Pain due to onychomycosis of toenails of both feet 10/21/2020   Porokeratosis 10/21/2020   Plantar fasciitis of right foot 10/21/2020   Fracture of forearm 12/26/2019   Obesity (BMI 30-39.9) 12/15/2019   Closed fracture of left radius and ulna 12/12/2019   Hyperlipidemia associated with type 2 diabetes mellitus (HCC) 12/12/2019   Hypertension associated with diabetes (HCC) 12/12/2019   Fracture closed of upper end of forearm 12/12/2019   CAD (coronary artery disease) 07/03/2019   Iron deficiency anemia 06/30/2019   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   NSTEMI (non-ST elevated myocardial infarction) (HCC) 05/13/2019   Nausea 04/02/2019   Dry eyes 04/02/2019   Insomnia 03/13/2019   Hypotension 03/05/2019   Muscle spasm 02/28/2019   Osteoarthritis of left knee 02/28/2019   Gout 02/16/2019   Hypothyroidism 02/16/2019   GERD (gastroesophageal reflux disease) 02/16/2019   Anxiety 02/16/2019   Hypokalemia 02/16/2019   Hyperuricemia 02/04/2019   Dysphagia 01/16/2019   Closed right ankle fracture 01/16/2019   Generalized weakness 01/16/2019   CKD (chronic kidney disease) stage 3, GFR 30-59 ml/min 11/26/2018   Cellulitis of lower extremity    Chronic diastolic CHF (congestive heart failure) (HCC) 09/08/2018   DM (diabetes mellitus), type 2 with complications (HCC) 09/08/2018   Hypertensive heart disease with CHF (congestive heart failure) (HCC) 09/08/2018   Leg edema 09/08/2018  Blood in stool 09/08/2018   OSA (obstructive sleep apnea) 09/08/2018   Sensorineural hearing loss (SNHL) of both ears 07/31/2017   Lumbar radiculopathy 12/05/2016   Diabetic polyneuropathy associated with diabetes mellitus due to underlying condition (HCC) 07/09/2015   Vitamin deficiency related neuropathy (HCC) 07/09/2015   Hx of adenomatous colonic polyps 12/02/2010    Past Surgical History:  Procedure Laterality Date   CARPAL TUNNEL RELEASE Left     left wrist   CATARACT EXTRACTION W/ INTRAOCULAR LENS  IMPLANT, BILATERAL Bilateral    1999 left, 2008 right   COLONOSCOPY     CORONARY ARTERY BYPASS GRAFT N/A 05/15/2019   Procedure: CORONARY ARTERY BYPASS GRAFTING (CABG) times two, left internal mammary artery to left anterior descending artery and left greater saphenous vein to posterior descending artery, left sapheouns vein harvested endoscopically ;  Surgeon: Delight Ovens, MD;  Location: [facility] OR;  Service: Open Heart Surgery;  Laterality: N/A;   EXCISION MORTON'S NEUROMA Left 1978   left foot   GLAUCOMA SURGERY Bilateral    and laser surgery, 2004 left, 2008 right   LEFT HEART CATH AND CORONARY ANGIOGRAPHY N/A 05/13/2019   Procedure: LEFT HEART CATH AND CORONARY ANGIOGRAPHY;  Surgeon: [place], Peter M, MD;  Location: [facility] INVASIVE CV LAB;  Service: Cardiovascular;  Laterality: N/A;   ORIF RADIAL FRACTURE Left 12/13/2019   Procedure: OPEN REDUCTION INTERNAL FIXATION (ORIF) FOREARM FRACTURE;  Surgeon: Bradly Bienenstock, MD;  Location: [facility] OR;  Service: Orthopedics;  Laterality: Left;   PARTIAL HYSTERECTOMY  1991   Left an ovary   POLYPECTOMY     TEE WITHOUT CARDIOVERSION N/A 05/15/2019   Procedure: TRANSESOPHAGEAL ECHOCARDIOGRAM (TEE);  Surgeon: Delight Ovens, MD;  Location: [facility] OR;  Service: Open Heart Surgery;  Laterality: N/A;   TOENAIL AVULSION Right    big toe    OB History   No obstetric history on file.      Home Medications    Prior to Admission medications   Medication Sig Start Date End Date Taking? Authorizing Provider  acetaminophen (TYLENOL) 650 MG CR tablet Take 650 mg by mouth every 8 (eight) hours.    [provider]  allopurinol (ZYLOPRIM) 100 MG tablet Take 0.5 tablets (50 mg total) by mouth daily for 14 days. 07/25/23 08/08/23  Bing Neighbors, NP  atorvastatin (LIPITOR) 80 MG tablet Take 1 tablet (80 mg total) by mouth daily at 6 PM. 05/28/19   Roddenberry, Cecille Amsterdam, PA-C  calcium carbonate (TUMS -  DOSED IN MG ELEMENTAL CALCIUM) 500 MG chewable tablet Chew 1-2 tablets by mouth as needed for indigestion or heartburn.    [provider]  clopidogrel (PLAVIX) 75 MG tablet Take 1 tablet (75 mg total) by mouth daily. 05/29/19   Leary Roca, PA-C  [employer] B6 100 MG tablet Take 1 tablet by mouth at bedtime. 06/19/20   [provider]  empagliflozin (JARDIANCE) 10 MG TABS tablet Take 10 mg by mouth daily.    [provider]  EPINEPHrine 0.3 mg/0.3 mL IJ SOAJ injection Inject 0.3 mg into the muscle once as needed for anaphylaxis.     [provider]  famotidine (PEPCID) 20 MG tablet Take 20 mg by mouth 2 (two) times daily. 11/20/19   [provider]  furosemide (LASIX) 40 MG tablet Take 1 tablet (40 mg total) by mouth 2 (two) times daily. 01/26/21   Tereso Newcomer T, PA-C  levothyroxine (SYNTHROID) 75 MCG tablet Take 1 tablet (75 mcg total) by mouth  daily before breakfast. 05/09/23   Shamleffer, Konrad Dolores, MD  loperamide (IMODIUM A-D) 2 MG tablet 1 tablet as needed    [provider]  LORazepam (ATIVAN) 0.5 MG tablet Take 0.5 mg by mouth every 8 (eight) hours.    [provider]  losartan (COZAAR) 25 MG tablet Take 0.5 tablets (12.5 mg total) by mouth daily. 06/06/19   Berton Bon, NP  Magnesium Oxide 400 MG CAPS Take 1 capsule (400 mg total) by mouth daily. 09/06/19   Berton Bon, NP  meclizine (ANTIVERT) 25 MG tablet Take 25 mg by mouth 3 (three) times daily as needed for dizziness.  Patient not taking: Reported on 06/21/2023 10/28/19   [provider]  Melatonin 3 MG TABS Take 1 tablet (3 mg total) by mouth at bedtime. FOR INSOMNIA 04/02/19   Medina-Vargas, Monina C, NP  metFORMIN (GLUCOPHAGE-XR) 750 MG 24 hr tablet Take 750 mg by mouth daily in the afternoon. 03/26/22   [provider]  metoprolol tartrate (LOPRESSOR) 25 MG tablet Take 0.5 tablets (12.5 mg total) by mouth 2 (two) times daily. 01/26/21 06/21/23   Tereso Newcomer T, PA-C  ondansetron (ZOFRAN) 4 MG tablet Take 1 tablet (4 mg total) by mouth every 6 (six) hours as needed for nausea or vomiting. GIVE 1 TABLET BY MOUTH EVERY 6 HOURS AS NEEDED FOR NAUSEA/VOMITING Patient not taking: Reported on 06/21/2023 04/02/19   Medina-Vargas, Monina C, NP  ONETOUCH VERIO test strip USE TO TEST BLOOD GLUCOSE ONCE DAILY 04/02/19   Medina-Vargas, Monina C, NP  pantoprazole (PROTONIX) 20 MG tablet Take 20 mg by mouth daily. 01/30/20   [provider]  polyvinyl alcohol (LIQUIFILM TEARS) 1.4 % ophthalmic solution Place 1 drop into both eyes every 4 (four) hours as needed for dry eyes. 04/02/19   Medina-Vargas, Monina C, NP  potassium chloride SA (KLOR-CON) 20 MEQ tablet Take 1 tablet by mouth once a week. On Wednesday    [provider]  Semaglutide (RYBELSUS) 7 MG TABS Take 7 mg by mouth daily. 10/31/22   [provider]  spironolactone (ALDACTONE) 25 MG tablet Take 25 mg by mouth at bedtime.    [provider]  traMADol (ULTRAM) 50 MG tablet Take 50 mg by mouth 4 (four) times daily as needed. 02/27/20   [provider]  vitamin B-12 (CYANOCOBALAMIN) 1000 MCG tablet 1 tab by orally daily 04/02/19   Medina-Vargas, Monina C, NP  Vitamin D, Ergocalciferol, (DRISDOL) 1.25 MG (50000 UNIT) CAPS capsule Take 1 capsule (50,000 Units total) by mouth every Thursday. 10/27/22   Shamleffer, Konrad Dolores, MD    Family History Family History  Problem Relation Age of Onset   Colon polyps Maternal Grandfather    Bone cancer Maternal Grandfather    Hypertension Mother    Diabetes Mother    Parkinson's disease Mother    Hypertension Father    Diabetes Father    Heart Problems Father    Asthma Brother    Diabetes Brother    Breast cancer Brother        stage 4, both breast   Obesity Other    Diabetes Sister        x 2   COPD Sister    Asthma Sister    Clotting disorder Sister        blood count   Rectal cancer Neg Hx    Stomach  cancer Neg Hx    Esophageal cancer Neg Hx     Social History Social History  Tobacco Use   Smoking status: Former    Current packs/day: 0.00    Types: Cigarettes    Quit date: 08/30/1971    Years since quitting: 51.9   Smokeless tobacco: Never  Vaping Use   Vaping status: Never Used  Substance Use Topics   Alcohol use: No    Alcohol/week: 0.0 standard drinks of alcohol   Drug use: No     Allergies   Aspirin, Bee pollen, Codeine, Fish oil, Sulfa antibiotics, Hornet venom, Iron, Lorazepam, Pantoprazole, Tape, Tramadol hcl, Camphor, Lisinopril, Penicillins, Sulfasalazine, and Vicodin [hydrocodone-acetaminophen]   Review of Systems Review of Systems Pertinent negatives listed in HPI   Physical Exam Triage Vital Signs ED Triage Vitals  Encounter Vitals Group     BP 07/25/23 1012 (!) 142/74     Systolic BP Percentile --      Diastolic BP Percentile --      Pulse Rate 07/25/23 1012 72     Resp 07/25/23 1012 18     Temp 07/25/23 1012 97.7 F (36.5 C)     Temp Source 07/25/23 1012 Oral     SpO2 07/25/23 1012 96 %     Weight 07/25/23 1010 212 lb (96.2 kg)     Height 07/25/23 1010 5\' 2"  (1.575 m)     Head Circumference --      Peak Flow --      Pain Score 07/25/23 1010 4     Pain Loc --      Pain Education --      Exclude from Growth Chart --    No data found.  Updated Vital Signs BP (!) 142/74 (BP Location: Left Arm)   Pulse 72   Temp 97.7 F (36.5 C) (Oral)   Resp 18   Ht 5\' 2"  (1.575 m)   Wt 212 lb (96.2 kg)   SpO2 96%   BMI 38.78 kg/m   Visual Acuity Right Eye Distance:   Left Eye Distance:   Bilateral Distance:    Right Eye Near:   Left Eye Near:    Bilateral Near:     Physical Exam Constitutional:      Appearance: She is obese. She is not ill-appearing.  HENT:     Head: Normocephalic and atraumatic.  Eyes:     Extraocular Movements: Extraocular movements intact.     Pupils: Pupils are equal, round, and reactive to light.  Cardiovascular:      Rate and Rhythm: Normal rate and regular rhythm.  Pulmonary:     Effort: Pulmonary effort is normal.     Breath sounds: Normal breath sounds.  Musculoskeletal:        General: No swelling.     Right lower leg: Edema present.     Left lower leg: Edema present.     Comments: Left great toe erythematous and swollen, tender to touch. Left ankle swollen  Neurological:     Mental Status: She is alert. Mental status is at baseline.     Comments: Uses a cane at baseline      UC Treatments / Results  Labs (all labs ordered are listed, but only abnormal results are displayed) Labs Reviewed - No data to display  EKG   Radiology No results found.  Procedures Procedures (including critical care time)  Medications Ordered in UC Medications - No data to display  Initial Impression / Assessment and Plan / UC Course  I have reviewed the triage vital signs and the nursing notes.  Pertinent labs &  imaging results that were available during my care of the patient were reviewed by me and considered in my medical decision making (see chart for details).    Treating for gouty arthritis involving the left ankle and great toe, both are sensitive to tactile sensation, erythematous and swollen consistent with gout flare.  Treating with allopurinol 50 mg  which patient reports has successfully resolved her gout flares in the past.  Treatment options limited by patient's chronic conditions including kidney disease, heart disease, and diabetes. Patient encouraged to follow up with primary care provider if symptoms worsen or do not improve.   Final Clinical Impressions(s) / UC Diagnoses   Final diagnoses:  Acute gouty arthritis, left ankle and great toe     Discharge Instructions      Take 1/2 tablet of Allopurinol daily for 2 weeks for gout flare.  Drink plenty of water.  Return here or follow-up with PCP if symptoms worsen or do not improve.     ED Prescriptions     Medication Sig  Dispense Auth. Provider   allopurinol (ZYLOPRIM) 100 MG tablet Take 0.5 tablets (50 mg total) by mouth daily for 14 days. 7 tablet Bing Neighbors, NP      PDMP not reviewed this encounter.   Bing Neighbors, NP 07/26/23 [phone]

## 2023-07-25 NOTE — ED Triage Notes (Addendum)
Patient presents with pain in left ankle and great toe pain x day 6. No injury that she is aware of, states she believes it gout. States it hurts with movement. Patient states she was prescribed Allopurinol 100 mg and it helped.

## 2023-08-03 ENCOUNTER — Encounter: Payer: Self-pay | Admitting: Hematology

## 2023-08-09 ENCOUNTER — Ambulatory Visit: Payer: Medicare Other

## 2023-08-09 VITALS — BP 109/69 | HR 69 | Temp 98.5°F | Resp 18 | Ht 62.5 in | Wt 216.4 lb

## 2023-08-09 DIAGNOSIS — D500 Iron deficiency anemia secondary to blood loss (chronic): Secondary | ICD-10-CM

## 2023-08-09 DIAGNOSIS — D509 Iron deficiency anemia, unspecified: Secondary | ICD-10-CM

## 2023-08-09 MED ORDER — SODIUM CHLORIDE 0.9 % IV SOLN
1000.0000 mg | Freq: Once | INTRAVENOUS | Status: AC
  Administered 2023-08-09: 1000 mg via INTRAVENOUS
  Filled 2023-08-09: qty 10

## 2023-08-09 MED ORDER — ACETAMINOPHEN 325 MG PO TABS
650.0000 mg | ORAL_TABLET | Freq: Once | ORAL | Status: AC
  Administered 2023-08-09: 650 mg via ORAL
  Filled 2023-08-09: qty 2

## 2023-08-09 MED ORDER — LORATADINE 10 MG PO TABS
10.0000 mg | ORAL_TABLET | Freq: Once | ORAL | Status: AC
  Administered 2023-08-09: 10 mg via ORAL
  Filled 2023-08-09: qty 1

## 2023-08-09 NOTE — Progress Notes (Signed)
Diagnosis: Iron Deficiency Anemia  Provider:  Chilton Greathouse MD  Procedure: IV Infusion  IV Type: Peripheral, IV Location: L Forearm  Monoferric (Ferric Derisomaltose), Dose: 1000 mg  Infusion Start Time: 0950  Infusion Stop Time: 1013  Post Infusion IV Care: Observation period completed and Peripheral IV Discontinued  Discharge: Condition: Good, Destination: Home . AVS Provided  Performed by:  Loney Hering, LPN

## 2023-09-05 ENCOUNTER — Ambulatory Visit
Admission: EM | Admit: 2023-09-05 | Discharge: 2023-09-05 | Disposition: A | Discharge status: Home / Self Care | Payer: Medicare Other | Attending: Family Medicine | Admitting: Family Medicine

## 2023-09-05 DIAGNOSIS — J22 Unspecified acute lower respiratory infection: Secondary | ICD-10-CM | POA: Diagnosis not present

## 2023-09-05 DIAGNOSIS — J029 Acute pharyngitis, unspecified: Secondary | ICD-10-CM

## 2023-09-05 MED ORDER — AZITHROMYCIN 250 MG PO TABS: ORAL_TABLET | ORAL | 0 refills | Status: AC

## 2023-09-05 NOTE — ED Provider Notes (Signed)
 [name]    CSN: [id number] Arrival date & time: 09/05/23  0900      History   Chief Complaint Chief Complaint  Patient presents with   Sore Throat    Sx 6 days   Sore throat Hoarse Non productive cough No fever.      HPI Megan [name] is a 78 y.o. female.   HPI  Patient presents today complaining of sore throat, cough, hoarseness, purulent nasal drainage and congestion ongoing for 6 days.  She reports she has not had a fever.  Denies any wheezing.  Endorses that cough is productive of discolored sputum.  She is also had loss of appetite.  Unaware of any known sick contacts.  She has taken some over-the-counter medication without any improvement of symptoms. Past Medical History:  Diagnosis Date   Allergy    bee stings   Anxiety    Asthmatic bronchitis    Broken arm 1957/2008   right   Cataract    had surgery    Depression    Diabetes mellitus without complication (HCC)    prediabetes diet controlled   Diverticulosis    GERD (gastroesophageal reflux disease)    Glaucoma    BIL   Hemorrhoids    Hx of adenomatous colonic polyps 12/02/2010   Hyperlipidemia    Hypertension    Hypothyroidism 05/2018   Inflammatory arthritis    Knee bursitis    Migraines    Obesity    Osteoarthritis    Sleep apnea     Patient Active Problem List   Diagnosis Date Noted   Constipation 10/21/2020   Decreased estrogen level 10/21/2020   Diabetic retinopathy associated with type 2 diabetes mellitus (HCC) 10/21/2020   Diverticular disease of colon 10/21/2020   Glaucoma 10/21/2020   Hearing loss 10/21/2020   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   Migraine without aura, not refractory 10/21/2020   Mixed hyperlipidemia 10/21/2020   Osteoarthritis 10/21/2020   Peripheral neuropathy 10/21/2020   Peripheral venous insufficiency 10/21/2020   Pain due to onychomycosis of toenails of both feet 10/21/2020   Porokeratosis 10/21/2020   Plantar fasciitis of  right foot 10/21/2020   Fracture of forearm 12/26/2019   Obesity (BMI 30-39.9) 12/15/2019   Closed fracture of left radius and ulna 12/12/2019   Hyperlipidemia associated with type 2 diabetes mellitus (HCC) 12/12/2019   Hypertension associated with diabetes (HCC) 12/12/2019   Fracture closed of upper end of forearm 12/12/2019   CAD (coronary artery disease) 07/03/2019   Iron  deficiency anemia 06/30/2019   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   NSTEMI (non-ST elevated myocardial infarction) (HCC) 05/13/2019   Nausea 04/02/2019   Dry eyes 04/02/2019   Insomnia 03/13/2019   Hypotension 03/05/2019   Muscle spasm 02/28/2019   Osteoarthritis of left knee 02/28/2019   Gout 02/16/2019   Hypothyroidism 02/16/2019   GERD (gastroesophageal reflux disease) 02/16/2019   Anxiety 02/16/2019   Hypokalemia 02/16/2019   Hyperuricemia 02/04/2019   Dysphagia 01/16/2019   Closed right ankle fracture 01/16/2019   Generalized weakness 01/16/2019   CKD (chronic kidney disease) stage 3, GFR 30-59 ml/min 11/26/2018   Cellulitis of lower extremity    Chronic diastolic CHF (congestive heart failure) (HCC) 09/08/2018   DM (diabetes mellitus), type 2 with complications (HCC) 09/08/2018   Hypertensive heart disease with CHF (congestive heart failure) (HCC) 09/08/2018   Leg edema 09/08/2018   Blood in stool 09/08/2018   OSA (obstructive sleep apnea) 09/08/2018   Sensorineural hearing loss (SNHL) of  both ears 07/31/2017   Lumbar radiculopathy 12/05/2016   Diabetic polyneuropathy associated with diabetes mellitus due to underlying condition (HCC) 07/09/2015   Vitamin deficiency related neuropathy (HCC) 07/09/2015   Hx of adenomatous colonic polyps 12/02/2010    Past Surgical History:  Procedure Laterality Date   CARPAL TUNNEL RELEASE Left    left wrist   CATARACT EXTRACTION W/ INTRAOCULAR LENS  IMPLANT, BILATERAL Bilateral    1999 left, 2008 right   COLONOSCOPY     CORONARY ARTERY BYPASS GRAFT N/A 05/15/2019    Procedure: CORONARY ARTERY BYPASS GRAFTING (CABG) times two, left internal mammary artery to left anterior descending artery and left greater saphenous vein to posterior descending artery, left sapheouns vein harvested endoscopically ;  Surgeon: Army Dallas NOVAK, MD;  Location: [facility] OR;  Service: Open Heart Surgery;  Laterality: N/A;   EXCISION MORTON'S NEUROMA Left 1978   left foot   GLAUCOMA SURGERY Bilateral    and laser surgery, 2004 left, 2008 right   LEFT HEART CATH AND CORONARY ANGIOGRAPHY N/A 05/13/2019   Procedure: LEFT HEART CATH AND CORONARY ANGIOGRAPHY;  Surgeon: Jordan, Peter M, MD;  Location: [facility] INVASIVE CV LAB;  Service: Cardiovascular;  Laterality: N/A;   ORIF RADIAL FRACTURE Left 12/13/2019   Procedure: OPEN REDUCTION INTERNAL FIXATION (ORIF) FOREARM FRACTURE;  Surgeon: Shari Easter, MD;  Location: [facility] OR;  Service: Orthopedics;  Laterality: Left;   PARTIAL HYSTERECTOMY  1991   Left an ovary   POLYPECTOMY     TEE WITHOUT CARDIOVERSION N/A 05/15/2019   Procedure: TRANSESOPHAGEAL ECHOCARDIOGRAM (TEE);  Surgeon: Army Dallas NOVAK, MD;  Location: [facility] OR;  Service: Open Heart Surgery;  Laterality: N/A;   TOENAIL AVULSION Right    big toe    OB History   No obstetric history on file.      Home Medications    Prior to Admission medications   Medication Sig Start Date End Date Taking? Authorizing Provider  acetaminophen  (TYLENOL ) 650 MG CR tablet Take 650 mg by mouth every 8 (eight) hours.   Yes [provider]  atorvastatin  (LIPITOR ) 80 MG tablet Take 1 tablet (80 mg total) by mouth daily at 6 PM. 05/28/19  Yes Roddenberry, Myron G, PA-C  azithromycin  (ZITHROMAX ) 250 MG tablet Take 2 tabs PO x 1 dose, then 1 tab PO QD x 4 days 09/05/23  Yes Arloa Suzen RAMAN, NP  calcium  carbonate (TUMS - DOSED IN MG ELEMENTAL CALCIUM ) 500 MG chewable tablet Chew 1-2 tablets by mouth as needed for indigestion or heartburn.   Yes [provider]  clopidogrel  (PLAVIX ) 75 MG tablet  Take 1 tablet (75 mg total) by mouth daily. 05/29/19  Yes Roddenberry, Myron G, PA-C  [employer] B6 100 MG tablet Take 1 tablet by mouth at bedtime. 06/19/20  Yes [provider]  empagliflozin (JARDIANCE) 10 MG TABS tablet Take 10 mg by mouth daily.   Yes [provider]  EPINEPHrine  0.3 mg/0.3 mL IJ SOAJ injection Inject 0.3 mg into the muscle once as needed for anaphylaxis.    Yes [provider]  famotidine  (PEPCID ) 20 MG tablet Take 20 mg by mouth 2 (two) times daily. 11/20/19  Yes [provider]  furosemide  (LASIX ) 40 MG tablet Take 1 tablet (40 mg total) by mouth 2 (two) times daily. 01/26/21  Yes Weaver, Scott T, PA-C  levothyroxine  (SYNTHROID ) 75 MCG tablet Take 1 tablet (75 mcg total) by mouth daily before breakfast. 05/09/23  Yes Shamleffer, Ibtehal Jaralla, MD  loperamide (IMODIUM A-D) 2  MG tablet 1 tablet as needed   Yes [provider]  LORazepam  (ATIVAN ) 0.5 MG tablet Take 0.5 mg by mouth every 8 (eight) hours.   Yes [provider]  losartan  (COZAAR ) 25 MG tablet Take 0.5 tablets (12.5 mg total) by mouth daily. 06/06/19  Yes Hammond, Janine, NP  Magnesium  Oxide 400 MG CAPS Take 1 capsule (400 mg total) by mouth daily. 09/06/19  Yes Hammond, Janine, NP  meclizine (ANTIVERT) 25 MG tablet Take 25 mg by mouth 3 (three) times daily as needed for dizziness. 10/28/19  Yes [provider]  metFORMIN  (GLUCOPHAGE -XR) 750 MG 24 hr tablet Take 750 mg by mouth daily in the afternoon. 03/26/22  Yes [provider]  ondansetron  (ZOFRAN ) 4 MG tablet Take 1 tablet (4 mg total) by mouth every 6 (six) hours as needed for nausea or vomiting. GIVE 1 TABLET BY MOUTH EVERY 6 HOURS AS NEEDED FOR NAUSEA/VOMITING 04/02/19  Yes Medina-Vargas, Monina C, NP  ONETOUCH VERIO test strip USE TO TEST BLOOD GLUCOSE ONCE DAILY 04/02/19  Yes Medina-Vargas, Monina C, NP  pantoprazole  (PROTONIX ) 20 MG tablet Take 20 mg by mouth daily. 01/30/20  Yes [provider]   polyvinyl alcohol  (LIQUIFILM TEARS) 1.4 % ophthalmic solution Place 1 drop into both eyes every 4 (four) hours as needed for dry eyes. 04/02/19  Yes Medina-Vargas, Monina C, NP  potassium chloride  SA (KLOR-CON ) 20 MEQ tablet Take 1 tablet by mouth once a week. On Wednesday   Yes [provider]  Semaglutide (RYBELSUS) 7 MG TABS Take 7 mg by mouth daily. 10/31/22  Yes [provider]  spironolactone  (ALDACTONE ) 25 MG tablet Take 25 mg by mouth at bedtime.   Yes [provider]  traMADol  (ULTRAM ) 50 MG tablet Take 50 mg by mouth 4 (four) times daily as needed. 02/27/20  Yes [provider]  vitamin B-12 (CYANOCOBALAMIN ) 1000 MCG tablet 1 tab by orally daily 04/02/19  Yes Medina-Vargas, Monina C, NP  Vitamin D , Ergocalciferol , (DRISDOL ) 1.25 MG (50000 UNIT) CAPS capsule Take 1 capsule (50,000 Units total) by mouth every Thursday. 10/27/22  Yes Shamleffer, Ibtehal Jaralla, MD  allopurinol  (ZYLOPRIM ) 100 MG tablet Take 0.5 tablets (50 mg total) by mouth daily for 14 days. 07/25/23 08/08/23  Arloa Suzen RAMAN, NP  Melatonin 3 MG TABS Take 1 tablet (3 mg total) by mouth at bedtime. FOR INSOMNIA 04/02/19   Medina-Vargas, Monina C, NP  metoprolol  tartrate (LOPRESSOR ) 25 MG tablet Take 0.5 tablets (12.5 mg total) by mouth 2 (two) times daily. 01/26/21 06/21/23  Lelon Glendia DASEN, PA-C    Family History Family History  Problem Relation Age of Onset   Colon polyps Maternal Grandfather    Bone cancer Maternal Grandfather    Hypertension Mother    Diabetes Mother    Parkinson's disease Mother    Hypertension Father    Diabetes Father    Heart Problems Father    Asthma Brother    Diabetes Brother    Breast cancer Brother        stage 4, both breast   Obesity Other    Diabetes Sister        x 2   COPD Sister    Asthma Sister    Clotting disorder Sister        blood count   Rectal cancer Neg Hx    Stomach cancer Neg Hx    Esophageal cancer Neg Hx     Social  History Social History   Tobacco  Use   Smoking status: Former    Current packs/day: 0.00    Types: Cigarettes    Quit date: 08/30/1971    Years since quitting: 52.0   Smokeless tobacco: Never  Vaping Use   Vaping status: Never Used  Substance Use Topics   Alcohol  use: No    Alcohol /week: 0.0 standard drinks of alcohol    Drug use: No     Allergies   Aspirin , Bee pollen, Codeine, Fish oil, Sulfa antibiotics, Hornet venom, Iron , Lorazepam , Pantoprazole , Tape, Tramadol  hcl, Camphor, Lisinopril, Penicillins, Sulfasalazine, and Vicodin [hydrocodone -acetaminophen ]   Review of Systems Review of Systems  Pertinent negatives listed in HPI  Physical Exam Triage Vital Signs ED Triage Vitals  Encounter Vitals Group     BP 09/05/23 0932 129/78     Systolic BP Percentile --      Diastolic BP Percentile --      Pulse Rate 09/05/23 0932 89     Resp 09/05/23 0932 20     Temp 09/05/23 0932 98.5 F (36.9 C)     Temp Source 09/05/23 0932 Oral     SpO2 09/05/23 0932 96 %     Weight --      Height --      Head Circumference --      Peak Flow --      Pain Score 09/05/23 0933 2     Pain Loc --      Pain Education --      Exclude from Growth Chart --    No data found.  Updated Vital Signs BP 129/78 (BP Location: Right Arm)   Pulse 89   Temp 98.5 F (36.9 C) (Oral)   Resp 20   SpO2 96%   Visual Acuity Right Eye Distance:   Left Eye Distance:   Bilateral Distance:    Right Eye Near:   Left Eye Near:    Bilateral Near:     Physical Exam Vitals reviewed.  Constitutional:      Appearance: She is ill-appearing.  HENT:     Head: Normocephalic and atraumatic.     Nose: Congestion and rhinorrhea present.     Mouth/Throat:     Mouth: Mucous membranes are moist.     Pharynx: Posterior oropharyngeal erythema present. No oropharyngeal exudate.  Eyes:     Extraocular Movements: Extraocular movements intact.     Conjunctiva/sclera: Conjunctivae normal.     Pupils: Pupils are  equal, round, and reactive to light.  Cardiovascular:     Rate and Rhythm: Normal rate and regular rhythm.  Pulmonary:     Effort: Pulmonary effort is normal.     Breath sounds: Rhonchi present.  Musculoskeletal:        General: Normal range of motion.     Cervical back: Normal range of motion and neck supple.  Lymphadenopathy:     Cervical: Cervical adenopathy present.  Skin:    General: Skin is warm and dry.  Neurological:     General: No focal deficit present.     Mental Status: She is alert.    UC Treatments / Results  Labs (all labs ordered are listed, but only abnormal results are displayed) Labs Reviewed - No data to display  EKG   Radiology No results found.  Procedures Procedures (including critical care time)  Medications Ordered in UC Medications - No data to display  Initial Impression / Assessment and Plan / UC Course  I have reviewed the triage vital signs and the nursing notes.  Pertinent labs & imaging results that were available during my care of the patient were reviewed by me and considered in my medical decision making (see chart for details).    1. Lower respiratory infection (e.g., bronchitis, pneumonia, pneumonitis, pulmonitis) (Primary) - azithromycin  (ZITHROMAX ) 250 MG tablet; Take 2 tabs PO x 1 dose, then 1 tab PO QD x 4 days  Dispense: 6 tablet; Refill: 0  2. Acute pharyngitis, unspecified etiology - azithromycin  (ZITHROMAX ) 250 MG tablet; Take 2 tabs PO x 1 dose, then 1 tab PO QD x 4 days  Dispense: 6 tablet; Refill: 0   Strict ED precautions given if symptoms worsen or do not improve.  Hydrate well with fluids.  Resume normal diet as tolerated.   Final Clinical Impressions(s) / UC Diagnoses   Final diagnoses:  Lower respiratory infection (e.g., bronchitis, pneumonia, pneumonitis, pulmonitis)  Acute pharyngitis, unspecified etiology     Discharge Instructions      Continue Coricidin over-the-counter  for cough as you are  allergic to the 2 cough medications that I attempted to prescribe to you. As discussed break the azithromycin  and have take medication as directed.  Complete entire course of antibiotics.  And ability to swallow worsens or tolerate foods worsen go immediately to the emergency department.  Recommend drinking chicken broth, applesauce, and hydrating well with fluids.     ED Prescriptions     Medication Sig Dispense Auth. Provider   azithromycin  (ZITHROMAX ) 250 MG tablet Take 2 tabs PO x 1 dose, then 1 tab PO QD x 4 days 6 tablet Arloa Suzen RAMAN, NP      PDMP not reviewed this encounter.   Arloa Suzen RAMAN, NP 09/06/23 [phone]

## 2023-09-05 NOTE — ED Triage Notes (Signed)
 Sx 6 days  Sore throat Hoarse Non productive cough No fever.

## 2023-09-05 NOTE — Discharge Instructions (Signed)
 Continue Coricidin over-the-counter  for cough as you are allergic to the 2 cough medications that I attempted to prescribe to you. As discussed break the azithromycin  and have take medication as directed.  Complete entire course of antibiotics.  And ability to swallow worsens or tolerate foods worsen go immediately to the emergency department.  Recommend drinking chicken broth, applesauce, and hydrating well with fluids.

## 2023-09-13 ENCOUNTER — Ambulatory Visit: Payer: Medicare Other | Admitting: Podiatry

## 2023-09-27 ENCOUNTER — Encounter: Payer: Self-pay | Admitting: Podiatry

## 2023-09-27 ENCOUNTER — Ambulatory Visit (INDEPENDENT_AMBULATORY_CARE_PROVIDER_SITE_OTHER): Payer: Medicare Other | Admitting: Podiatry

## 2023-09-27 DIAGNOSIS — B351 Tinea unguium: Secondary | ICD-10-CM

## 2023-09-27 DIAGNOSIS — E0842 Diabetes mellitus due to underlying condition with diabetic polyneuropathy: Secondary | ICD-10-CM

## 2023-09-27 DIAGNOSIS — M79675 Pain in left toe(s): Secondary | ICD-10-CM

## 2023-09-27 DIAGNOSIS — M79674 Pain in right toe(s): Secondary | ICD-10-CM

## 2023-09-27 NOTE — Progress Notes (Signed)
This patient returns to my office for at risk foot care.  This patient requires this care by a professional since this patient will be at risk due to having diabetes and chronic kidney disease.  This patient is unable to cut nails herself since the patient cannot reach her nails.These nails are painful walking and wearing shoes.  She has a painful callus under her big toe joint right foot.  This is painful walking and wearing her shoes.    This patient presents for at risk foot care today.  General Appearance  Alert, conversant and in no acute stress.  Vascular  Dorsalis pedis and posterior tibial  pulses are weakly  palpable  bilaterally.  Capillary return is within normal limits  bilaterally. Temperature is within normal limits  bilaterally.  Neurologic  Senn-Weinstein monofilament wire test within normal limits/diminished   bilaterally. Muscle power within normal limits bilaterally.  Nails Thick disfigured discolored nails with subungual debris  from hallux to fifth toes bilaterally. No evidence of bacterial infection or drainage bilaterally.  Orthopedic  No limitations of motion  feet .  No crepitus or effusions noted.  No bony pathology or digital deformities noted.  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  Red inflamed hot lower legs.  Skin  normotropic skin  noted bilaterally.  No signs of infections or ulcers noted.  Callus sub 1st MPJ right foot asymptomatic. Red swollen front both legs.    Onychomycosis  Pain in right toes  Pain in left toes  Callus sub 1 right foot.    Consent was obtained for treatment procedures.   Mechanical debridement of nails 1-5  bilaterally performed with a nail nipper.  Filed with dremel without incident.  Debridement of callus with dremel tool.     Return office visit  3 months                   Told patient to return for periodic foot care and evaluation due to potential at risk complications.   Helane Gunther DPM

## 2023-10-03 NOTE — Telephone Encounter (Signed)
Prolia VOB initiated via [email or web address]  Next Prolia inj DUE: 10/31/23

## 2023-10-10 NOTE — Telephone Encounter (Signed)
[employer]  - Prior Authorization REQUIRED   PA PROCESS DETAILS: Effective 08/29/2021 if the patient is new to [facility], PA and Step Therapy are  required & not on file. Please go to [email or web address]  or call [phone] to initiate the PA.   For exception to the policy please visit  [email or web address] and review Policy Number [email or web address]

## 2023-10-16 ENCOUNTER — Other Ambulatory Visit: Payer: Self-pay

## 2023-10-16 ENCOUNTER — Ambulatory Visit
Admission: RE | Admit: 2023-10-16 | Discharge: 2023-10-16 | Disposition: A | Discharge status: Home / Self Care | Payer: Medicare Other | Source: Ambulatory Visit

## 2023-10-16 VITALS — BP 133/79 | HR 79 | Temp 98.2°F | Resp 16

## 2023-10-16 DIAGNOSIS — M25571 Pain in right ankle and joints of right foot: Secondary | ICD-10-CM | POA: Diagnosis not present

## 2023-10-16 DIAGNOSIS — M79604 Pain in right leg: Secondary | ICD-10-CM

## 2023-10-16 MED ORDER — PREDNISONE 10 MG (21) PO TBPK: ORAL_TABLET | Freq: Every day | ORAL | 0 refills | Status: AC

## 2023-10-16 MED ORDER — DEXAMETHASONE SODIUM PHOSPHATE 10 MG/ML IJ SOLN
10.0000 mg | Freq: Once | INTRAMUSCULAR | Status: AC
  Administered 2023-10-16: 10 mg via INTRAMUSCULAR

## 2023-10-16 NOTE — ED Triage Notes (Signed)
Pt reports severe R leg pain x5 days. Reports pain started in R ankle and has moved all the way up into R hip. Describes sharp, throbbing pain. Reports trouble even lifting foot up feels like she is dragging it. Pt has hx of gout. Pt is usually ambulatory at home with no assistive devices, but has needed to start using cane in the past 5 days. Seen in transport chair today at UC due to trouble walking.

## 2023-10-16 NOTE — ED Provider Notes (Signed)
[name]    CSN: [id number] Arrival date & time: 10/16/23  7846      History   Chief Complaint Chief Complaint  Patient presents with   Leg Pain    HPI Megan [name] is a 78 y.o. female.   Patient presents for evaluation of right ankle and leg pain present for 5 days.  Symptoms started abruptly when changing positions from seated to standing.  Described as sharp and shooting, radiates into the right leg until the hip.  Painful to bear weight and unable to lift leg at this time.  Has attempted use of Tylenol arthritis.  Denies numbness or tingling, injury or trauma.  History of gout and osteoarthritis.    Past Medical History:  Diagnosis Date   Allergy    bee stings   Anxiety    Asthmatic bronchitis    Broken arm 1957/2008   right   Cataract    had surgery    Depression    Diabetes mellitus without complication (HCC)    prediabetes diet controlled   Diverticulosis    GERD (gastroesophageal reflux disease)    Glaucoma    BIL   Hemorrhoids    Hx of adenomatous colonic polyps 12/02/2010   Hyperlipidemia    Hypertension    Hypothyroidism 05/2018   Inflammatory arthritis    Knee bursitis    Migraines    Obesity    Osteoarthritis    Sleep apnea     Patient Active Problem List   Diagnosis Date Noted   Decreased estrogen level 10/21/2020   Diabetic retinopathy associated with type 2 diabetes mellitus (HCC) 10/21/2020   Diverticular disease of colon 10/21/2020   Glaucoma 10/21/2020   Hearing loss 10/21/2020   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   Migraine without aura, not refractory 10/21/2020   Hyperlipidemia 10/21/2020   Inflammatory arthritis 10/21/2020   Peripheral neuropathy 10/21/2020   Peripheral venous insufficiency 10/21/2020   Pain due to onychomycosis of toenails of both feet 10/21/2020   Porokeratosis 10/21/2020   Plantar fasciitis of right foot 10/21/2020   Fracture of forearm 12/26/2019   Obesity (BMI 30-39.9)  12/15/2019   Closed fracture of left radius and ulna 12/12/2019   Hyperlipidemia associated with type 2 diabetes mellitus (HCC) 12/12/2019   Hypertension associated with diabetes (HCC) 12/12/2019   Fracture closed of upper end of forearm 12/12/2019   CAD (coronary artery disease) 07/03/2019   Iron deficiency anemia 06/30/2019   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   NSTEMI (non-ST elevated myocardial infarction) (HCC) 05/13/2019   Nausea 04/02/2019   Insomnia 03/13/2019   Hypotension 03/05/2019   Muscle spasm 02/28/2019   Osteoarthritis of left knee 02/28/2019   Gout 02/16/2019   Hypothyroidism 02/16/2019   GERD (gastroesophageal reflux disease) 02/16/2019   Anxiety 02/16/2019   Hypokalemia 02/16/2019   Hyperuricemia 02/04/2019   Dysphagia 01/16/2019   Closed right ankle fracture 01/16/2019   Generalized weakness 01/16/2019   CKD (chronic kidney disease) stage 3, GFR 30-59 ml/min 11/26/2018   Cellulitis of lower extremity    Chronic diastolic CHF (congestive heart failure) (HCC) 09/08/2018   DM (diabetes mellitus), type 2 with complications (HCC) 09/08/2018   Hypertensive heart disease with CHF (congestive heart failure) (HCC) 09/08/2018   Leg edema 09/08/2018   OSA (obstructive sleep apnea) 09/08/2018   Sensorineural hearing loss (SNHL) of both ears 07/31/2017   Lumbar radiculopathy 12/05/2016   Diabetic polyneuropathy associated with diabetes mellitus due to underlying condition (HCC) 07/09/2015   Vitamin deficiency  related neuropathy (HCC) 07/09/2015   Hx of adenomatous colonic polyps 12/02/2010    Past Surgical History:  Procedure Laterality Date   CARPAL TUNNEL RELEASE Left    left wrist   CATARACT EXTRACTION W/ INTRAOCULAR LENS  IMPLANT, BILATERAL Bilateral    1999 left, 2008 right   COLONOSCOPY     CORONARY ARTERY BYPASS GRAFT N/A 05/15/2019   Procedure: CORONARY ARTERY BYPASS GRAFTING (CABG) times two, left internal mammary artery to left anterior descending artery and left  greater saphenous vein to posterior descending artery, left sapheouns vein harvested endoscopically ;  Surgeon: Delight Ovens, MD;  Location: [facility] OR;  Service: Open Heart Surgery;  Laterality: N/A;   EXCISION MORTON'S NEUROMA Left 1978   left foot   GLAUCOMA SURGERY Bilateral    and laser surgery, 2004 left, 2008 right   LEFT HEART CATH AND CORONARY ANGIOGRAPHY N/A 05/13/2019   Procedure: LEFT HEART CATH AND CORONARY ANGIOGRAPHY;  Surgeon: Swazil[place], Peter M, MD;  Location: [facility] INVASIVE CV LAB;  Service: Cardiovascular;  Laterality: N/A;   ORIF RADIAL FRACTURE Left 12/13/2019   Procedure: OPEN REDUCTION INTERNAL FIXATION (ORIF) FOREARM FRACTURE;  Surgeon: Bradly Bienenstock, MD;  Location: [facility] OR;  Service: Orthopedics;  Laterality: Left;   PARTIAL HYSTERECTOMY  1991   Left an ovary   POLYPECTOMY     TEE WITHOUT CARDIOVERSION N/A 05/15/2019   Procedure: TRANSESOPHAGEAL ECHOCARDIOGRAM (TEE);  Surgeon: Delight Ovens, MD;  Location: [facility] OR;  Service: Open Heart Surgery;  Laterality: N/A;   TOENAIL AVULSION Right    big toe    OB History   No obstetric history on file.      Home Medications    Prior to Admission medications   Medication Sig Start Date End Date Taking? Authorizing Provider  acetaminophen (TYLENOL) 500 MG tablet Take by mouth. 07/31/17  Yes [provider]  atorvastatin (LIPITOR) 80 MG tablet Take 1 tablet (80 mg total) by mouth daily at 6 PM. 05/28/19  Yes Roddenberry, Myron G, PA-C  calcium carbonate (TUMS - DOSED IN MG ELEMENTAL CALCIUM) 500 MG chewable tablet Chew 1-2 tablets by mouth as needed for indigestion or heartburn.   Yes [provider]  Cholecalciferol (D 1000) 25 MCG (1000 UT) capsule Take by mouth. 07/31/17  Yes [provider]  clopidogrel (PLAVIX) 75 MG tablet Take 1 tablet (75 mg total) by mouth daily. 05/29/19  Yes Roddenberry, Cecille Amsterdam, PA-C  [employer] B6 100 MG tablet Take 1 tablet by mouth at bedtime. 06/19/20  Yes [provider]   empagliflozin (JARDIANCE) 10 MG TABS tablet Take 10 mg by mouth daily.   Yes [provider]  famotidine (PEPCID) 20 MG tablet Take 20 mg by mouth 2 (two) times daily. 11/20/19  Yes [provider]  furosemide (LASIX) 40 MG tablet Take 1 tablet (40 mg total) by mouth 2 (two) times daily. 01/26/21  Yes Tereso Newcomer T, PA-C  levothyroxine (SYNTHROID) 75 MCG tablet Take 1 tablet (75 mcg total) by mouth daily before breakfast. 05/09/23  Yes Shamleffer, Konrad Dolores, MD  losartan (COZAAR) 25 MG tablet Take 0.5 tablets (12.5 mg total) by mouth daily. 06/06/19  Yes Berton Bon, NP  metFORMIN (GLUCOPHAGE-XR) 750 MG 24 hr tablet Take 750 mg by mouth daily in the afternoon. 03/26/22  Yes [provider]  metoprolol succinate (TOPROL-XL) 25 MG 24 hr tablet Take 25 mg by mouth daily. 07/12/23  Yes [provider]  ONETOUCH VERIO test strip USE TO TEST BLOOD  GLUCOSE ONCE DAILY 04/02/19  Yes Medina-Vargas, Monina C, NP  polyvinyl alcohol (LIQUIFILM TEARS) 1.4 % ophthalmic solution Place 1 drop into both eyes every 4 (four) hours as needed for dry eyes. 04/02/19  Yes Medina-Vargas, Monina C, NP  potassium chloride SA (KLOR-CON) 20 MEQ tablet Take 1 tablet by mouth once a week. On Wednesday   Yes [provider]  RYBELSUS 14 MG TABS Take 1 tablet by mouth every morning. 10/14/23  Yes [provider]  spironolactone (ALDACTONE) 25 MG tablet Take 25 mg by mouth at bedtime.   Yes [provider]  vitamin B-12 (CYANOCOBALAMIN) 1000 MCG tablet 1 tab by orally daily 04/02/19  Yes Medina-Vargas, Monina C, NP  Vitamin D, Ergocalciferol, (DRISDOL) 1.25 MG (50000 UNIT) CAPS capsule Take 1 capsule (50,000 Units total) by mouth every Thursday. 10/27/22  Yes Shamleffer, Konrad Dolores, MD  acetaminophen (TYLENOL) 650 MG CR tablet Take 650 mg by mouth every 8 (eight) hours. Patient not taking: Reported on 10/16/2023    [provider]  allopurinol (ZYLOPRIM) 100  MG tablet Take 0.5 tablets (50 mg total) by mouth daily for 14 days. 07/25/23 08/08/23  Bing Neighbors, NP  azithromycin (ZITHROMAX) 250 MG tablet Take 2 tabs PO x 1 dose, then 1 tab PO QD x 4 days Patient not taking: Reported on 10/16/2023 09/05/23   Bing Neighbors, NP  EPINEPHrine 0.3 mg/0.3 mL IJ SOAJ injection Inject 0.3 mg into the muscle once as needed for anaphylaxis.     [provider]  gabapentin (NEURONTIN) 600 MG tablet Take by mouth. Patient not taking: Reported on 10/16/2023 07/11/16   [provider]  loperamide (IMODIUM A-D) 2 MG tablet 1 tablet as needed    [provider]  LORazepam (ATIVAN) 0.5 MG tablet Take 0.5 mg by mouth every 8 (eight) hours. Patient not taking: Reported on 10/16/2023    [provider]  Magnesium Oxide 400 MG CAPS Take 1 capsule (400 mg total) by mouth daily. Patient not taking: Reported on 10/16/2023 09/06/19   Berton Bon, NP  meclizine (ANTIVERT) 25 MG tablet Take 25 mg by mouth 3 (three) times daily as needed for dizziness. 10/28/19   [provider]  Melatonin 3 MG TABS Take 1 tablet (3 mg total) by mouth at bedtime. FOR INSOMNIA 04/02/19   Medina-Vargas, Monina C, NP  metoprolol tartrate (LOPRESSOR) 25 MG tablet Take 0.5 tablets (12.5 mg total) by mouth 2 (two) times daily. Patient not taking: Reported on 10/16/2023 01/26/21 06/21/23  Tereso Newcomer T, PA-C  ondansetron (ZOFRAN) 4 MG tablet Take 1 tablet (4 mg total) by mouth every 6 (six) hours as needed for nausea or vomiting. GIVE 1 TABLET BY MOUTH EVERY 6 HOURS AS NEEDED FOR NAUSEA/VOMITING 04/02/19   Medina-Vargas, Monina C, NP  pantoprazole (PROTONIX) 20 MG tablet Take 20 mg by mouth daily. Patient not taking: Reported on 10/16/2023 01/30/20   [provider]  Semaglutide (RYBELSUS) 7 MG TABS Take 7 mg by mouth daily. Patient not taking: Reported on 10/16/2023 10/31/22   [provider]  traMADol (ULTRAM) 50 MG tablet Take 50 mg by mouth 4  (four) times daily as needed. Patient not taking: Reported on 10/16/2023 02/27/20   [provider]    Family History Family History  Problem Relation Age of Onset   Colon polyps Maternal Grandfather    Bone cancer Maternal Grandfather    Hypertension Mother    Diabetes Mother    Parkinson's disease Mother  Hypertension Father    Diabetes Father    Heart Problems Father    Asthma Brother    Diabetes Brother    Breast cancer Brother        stage 4, both breast   Obesity Other    Diabetes Sister        x 2   COPD Sister    Asthma Sister    Clotting disorder Sister        blood count   Rectal cancer Neg Hx    Stomach cancer Neg Hx    Esophageal cancer Neg Hx     Social History Social History   Tobacco Use   Smoking status: Former    Current packs/day: 0.00    Types: Cigarettes    Quit date: 08/30/1971    Years since quitting: 52.1   Smokeless tobacco: Never  Vaping Use   Vaping status: Never Used  Substance Use Topics   Alcohol use: No    Alcohol/week: 0.0 standard drinks of alcohol   Drug use: No     Allergies   Aspirin, Bee pollen, Codeine, Fish oil, Sulfa antibiotics, Hornet venom, Iron, Lorazepam, Pantoprazole, Tape, Tramadol hcl, Camphor, Lisinopril, Penicillins, Sulfasalazine, and Vicodin [hydrocodone-acetaminophen]   Review of Systems Review of Systems   Physical Exam Triage Vital Signs ED Triage Vitals  Encounter Vitals Group     BP 10/16/23 1013 133/79     Systolic BP Percentile --      Diastolic BP Percentile --      Pulse Rate 10/16/23 1013 79     Resp 10/16/23 1013 16     Temp 10/16/23 1013 98.2 F (36.8 C)     Temp Source 10/16/23 1013 Oral     SpO2 10/16/23 1013 98 %     Weight --      Height --      Head Circumference --      Peak Flow --      Pain Score 10/16/23 1014 7     Pain Loc --      Pain Education --      Exclude from Growth Chart --    No data found.  Updated Vital Signs BP 133/79 (BP Location: Left Arm)    Pulse 79   Temp 98.2 F (36.8 C) (Oral)   Resp 16   SpO2 98%   Visual Acuity Right Eye Distance:   Left Eye Distance:   Bilateral Distance:    Right Eye Near:   Left Eye Near:    Bilateral Near:     Physical Exam Constitutional:      Appearance: Normal appearance.  Eyes:     Extraocular Movements: Extraocular movements intact.  Pulmonary:     Effort: Pulmonary effort is normal.  Musculoskeletal:     Comments: Generalized tenderness with mild swelling to the right ankle, generalized tenderness to the right leg, minimal effort to bear weight due to pain elicited, can complete range of motion, pain with all movement, 2 + dorsalis pedis pulse   Neurological:     Mental Status: She is alert and oriented to person, place, and time. Mental status is at baseline.      UC Treatments / Results  Labs (all labs ordered are listed, but only abnormal results are displayed) Labs Reviewed - No data to display  EKG   Radiology No results found.  Procedures Procedures (including critical care time)  Medications Ordered in UC Medications - No data to display  Initial  Impression / Assessment and Plan / UC Course  I have reviewed the triage vital signs and the nursing notes.  Pertinent labs & imaging results that were available during my care of the patient were reviewed by me and considered in my medical decision making (see chart for details).  Acute right ankle pain, acute right leg pain  Etiology most likely inflammatory, low suspicion for bone involvement due to lack of injury therefore imaging deferred, taking blood thinners therefore NSAIDs deferred, Decadron IM given and oral prednisone prescribed, advised monitoring blood sugar closely,  recommended additional over-the-counter analgesics and topical medications, recommended supportive care with follow-up with orthopedics if symptoms persist or worsen Final Clinical Impressions(s) / UC Diagnoses   Final diagnoses:  None    Discharge Instructions   None    ED Prescriptions   None    PDMP not reviewed this encounter.   Valinda Hoar, NP 10/16/23 1042

## 2023-10-16 NOTE — Discharge Instructions (Signed)
Your pain is most likely caused by an inflammatory cause such as gout or arthritis.  \You have been given an injection of Decadron which is a steroid to reduce inflammation and help with pain, you may take Tylenol or use any topical medicines in addition to this  Starting tomorrow take your prednisone as directed to continue the above process, take with food, will temporarily increase your blood sugar, monitor closely   you may find comfort in using ice in 10-15 minutes over affected area  Begin massaging and stretching affected area daily for 10 minutes as tolerated to further loosen muscles   When lying down place pillow underneath and between knees for support  If pain persist after recommended treatment or reoccurs if may be beneficial to follow up with orthopedic specialist for evaluation, this doctor specializes in the bones and can manage your symptoms long-term with options such as but not limited to imaging, medications or physical therapy

## 2023-10-22 NOTE — Telephone Encounter (Signed)
 Patient is ready for scheduling on or after 10/31/23 BUY AND BILL  Out-of-pocket cost due at time of visit: $356.87  Primary: [employer] Medicare Adv HMO POS Prolia co-insurance: 20% (approximately $331.87) Admin fee co-insurance: 20% (approximately $25)  Deductible: does not apply  Prior Auth: APPROVED PA# [id number] Valid: 10/22/23-10/21/24  Secondary: [employer] Prolia co-insurance: Covers Medicare Part B co-insurance Admin fee co-insurance: Covers Medicare Part B co-insurance  Deductible:  Covered by secondary  Prior Auth: NOT required PA# Valid:   ** This summary of benefits is an estimation of the patient's out-of-pocket cost. Exact cost may vary based on individual plan coverage.

## 2023-10-22 NOTE — Telephone Encounter (Signed)
 Prior Authorization for [facility] APPROVED PA# [id number] Valid: 10/22/23-10/21/24

## 2023-10-23 NOTE — Telephone Encounter (Signed)
 LMx1 to schedule Prolia injection.

## 2023-11-07 ENCOUNTER — Other Ambulatory Visit: Payer: Self-pay | Admitting: Internal Medicine

## 2023-11-15 ENCOUNTER — Ambulatory Visit: Payer: Medicare Other

## 2023-11-15 VITALS — BP 120/80 | HR 70 | Ht 62.5 in | Wt 213.0 lb

## 2023-11-15 DIAGNOSIS — M810 Age-related osteoporosis without current pathological fracture: Secondary | ICD-10-CM

## 2023-11-15 MED ORDER — DENOSUMAB 60 MG/ML ~~LOC~~ SOSY
60.0000 mg | PREFILLED_SYRINGE | SUBCUTANEOUS | Status: AC
  Administered 2023-11-15: 60 mg via SUBCUTANEOUS

## 2023-11-15 MED ORDER — DENOSUMAB 60 MG/ML ~~LOC~~ SOSY
60.0000 mg | PREFILLED_SYRINGE | SUBCUTANEOUS | Status: AC
  Administered 2024-06-07: 60 mg via SUBCUTANEOUS

## 2023-11-15 NOTE — Progress Notes (Signed)
After obtaining consent, and per orders of Dr. Kelton Pillar, injection of Prolia '60mg'$  given by Danijela Vessey L Laycie Schriner in right arm. Patient instructed to remain in clinic for 20 minutes afterwards, and to report any adverse reaction to me immediately.

## 2023-12-01 ENCOUNTER — Other Ambulatory Visit: Payer: Self-pay

## 2023-12-01 DIAGNOSIS — D509 Iron deficiency anemia, unspecified: Secondary | ICD-10-CM

## 2023-12-01 DIAGNOSIS — D649 Anemia, unspecified: Secondary | ICD-10-CM

## 2023-12-04 ENCOUNTER — Encounter: Payer: Self-pay | Admitting: Cardiovascular Disease

## 2023-12-04 ENCOUNTER — Inpatient Hospital Stay (HOSPITAL_BASED_OUTPATIENT_CLINIC_OR_DEPARTMENT_OTHER): Payer: Medicare Other | Admitting: Hematology

## 2023-12-04 ENCOUNTER — Inpatient Hospital Stay: Payer: Medicare Other | Attending: Hematology

## 2023-12-04 VITALS — BP 149/85 | HR 85 | Temp 97.7°F | Resp 17 | Wt 213.7 lb

## 2023-12-04 DIAGNOSIS — Z7902 Long term (current) use of antithrombotics/antiplatelets: Secondary | ICD-10-CM | POA: Diagnosis not present

## 2023-12-04 DIAGNOSIS — E119 Type 2 diabetes mellitus without complications: Secondary | ICD-10-CM | POA: Diagnosis not present

## 2023-12-04 DIAGNOSIS — E785 Hyperlipidemia, unspecified: Secondary | ICD-10-CM | POA: Diagnosis not present

## 2023-12-04 DIAGNOSIS — Z7952 Long term (current) use of systemic steroids: Secondary | ICD-10-CM | POA: Diagnosis not present

## 2023-12-04 DIAGNOSIS — D649 Anemia, unspecified: Secondary | ICD-10-CM

## 2023-12-04 DIAGNOSIS — K219 Gastro-esophageal reflux disease without esophagitis: Secondary | ICD-10-CM | POA: Diagnosis not present

## 2023-12-04 DIAGNOSIS — D509 Iron deficiency anemia, unspecified: Secondary | ICD-10-CM | POA: Diagnosis not present

## 2023-12-04 DIAGNOSIS — J45909 Unspecified asthma, uncomplicated: Secondary | ICD-10-CM | POA: Insufficient documentation

## 2023-12-04 DIAGNOSIS — G4730 Sleep apnea, unspecified: Secondary | ICD-10-CM | POA: Diagnosis not present

## 2023-12-04 DIAGNOSIS — M1990 Unspecified osteoarthritis, unspecified site: Secondary | ICD-10-CM | POA: Insufficient documentation

## 2023-12-04 DIAGNOSIS — M109 Gout, unspecified: Secondary | ICD-10-CM | POA: Insufficient documentation

## 2023-12-04 DIAGNOSIS — E039 Hypothyroidism, unspecified: Secondary | ICD-10-CM | POA: Insufficient documentation

## 2023-12-04 DIAGNOSIS — I10 Essential (primary) hypertension: Secondary | ICD-10-CM | POA: Insufficient documentation

## 2023-12-04 DIAGNOSIS — Z803 Family history of malignant neoplasm of breast: Secondary | ICD-10-CM | POA: Diagnosis not present

## 2023-12-04 DIAGNOSIS — Z87891 Personal history of nicotine dependence: Secondary | ICD-10-CM | POA: Diagnosis not present

## 2023-12-04 DIAGNOSIS — L89219 Pressure ulcer of right hip, unspecified stage: Secondary | ICD-10-CM | POA: Insufficient documentation

## 2023-12-04 DIAGNOSIS — Z79899 Other long term (current) drug therapy: Secondary | ICD-10-CM | POA: Insufficient documentation

## 2023-12-04 LAB — CBC WITH DIFFERENTIAL (CANCER CENTER ONLY)
Abs Immature Granulocytes: 0.03 10*3/uL (ref 0.00–0.07)
Basophils Absolute: 0 10*3/uL (ref 0.0–0.1)
Basophils Relative: 0 %
Eosinophils Absolute: 0.1 10*3/uL (ref 0.0–0.5)
Eosinophils Relative: 1 %
HCT: 42.7 % (ref 36.0–46.0)
Hemoglobin: 14.1 g/dL (ref 12.0–15.0)
Immature Granulocytes: 0 %
Lymphocytes Relative: 16 %
Lymphs Abs: 1.5 10*3/uL (ref 0.7–4.0)
MCH: 30.1 pg (ref 26.0–34.0)
MCHC: 33 g/dL (ref 30.0–36.0)
MCV: 91 fL (ref 80.0–100.0)
Monocytes Absolute: 0.5 10*3/uL (ref 0.1–1.0)
Monocytes Relative: 5 %
Neutro Abs: 7.2 10*3/uL (ref 1.7–7.7)
Neutrophils Relative %: 78 %
Platelet Count: 262 10*3/uL (ref 150–400)
RBC: 4.69 MIL/uL (ref 3.87–5.11)
RDW: 15.5 % (ref 11.5–15.5)
WBC Count: 9.3 10*3/uL (ref 4.0–10.5)
nRBC: 0 % (ref 0.0–0.2)

## 2023-12-04 LAB — CMP (CANCER CENTER ONLY)
ALT: 17 U/L (ref 0–44)
AST: 15 U/L (ref 15–41)
Albumin: 4.2 g/dL (ref 3.5–5.0)
Alkaline Phosphatase: 104 U/L (ref 38–126)
Anion gap: 10 (ref 5–15)
BUN: 24 mg/dL — ABNORMAL HIGH (ref 8–23)
CO2: 24 mmol/L (ref 22–32)
Calcium: 8.5 mg/dL — ABNORMAL LOW (ref 8.9–10.3)
Chloride: 105 mmol/L (ref 98–111)
Creatinine: 1.1 mg/dL — ABNORMAL HIGH (ref 0.44–1.00)
GFR, Estimated: 51 mL/min — ABNORMAL LOW (ref >60)
Glucose, Bld: 173 mg/dL — ABNORMAL HIGH (ref 70–99)
Potassium: 3.8 mmol/L (ref 3.5–5.1)
Sodium: 139 mmol/L (ref 135–145)
Total Bilirubin: 0.4 mg/dL (ref 0.0–1.2)
Total Protein: 7.2 g/dL (ref 6.5–8.1)

## 2023-12-04 LAB — FERRITIN: Ferritin: 76 ng/mL (ref 11–307)

## 2023-12-04 LAB — IRON AND IRON BINDING CAPACITY (CC-WL,HP ONLY)
Iron: 52 ug/dL (ref 28–170)
Saturation Ratios: 18 % (ref 10.4–31.8)
TIBC: 297 ug/dL (ref 250–450)
UIBC: 245 ug/dL (ref 148–442)

## 2023-12-04 LAB — VITAMIN B12: Vitamin B-12: 2260 pg/mL — ABNORMAL HIGH (ref 180–914)
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
## 2023-12-04 NOTE — Progress Notes (Signed)
 Aaron Aas    HEMATOLOGY/ONCOLOGY CLINIC NOTE  Date of Service: 12/04/23    Patient Care Team: Glena Landau, MD as PCP - General (Family Medicine) Nahser, Lela Purple, MD as PCP - Cardiology (Cardiology)  CHIEF COMPLAINTS/PURPOSE OF CONSULTATION:  Follow-up for continued evaluation and management of normocytic anemia.  HISTORY OF PRESENTING ILLNESS:  Megan [name] is a wonderful 78 y.o. female who has been referred to us  by Dr .Glena Landau, MD  for evaluation and management of normocytic anemia.  Patient has a h/o inflammatory arthritis, hypothyroidism, iron deficiency, HTN, DM2, glaucoma, asthma . Patient had recent labs with her PCP on 08/13/2018 which showed WBC 7.9k, HCT 30.4, MCV 86, platelets of 349k. Nl FT4 levels. Ferritin 32.  Interval History:   Megan [name] is here for continued evaluation and management of iron deficiency/multifactorial anemia.  Patient was last seen by me on 06/21/2023 and she complained of continuous sleep difficulties, but was doing well overall.   Patient notes she has been doing well overall since our last visit. She denies any new infection issues, fever, chills, night sweats, unexpected weight loss, back pain, chest pain, abdominal pain, abnormal bowel movement, black stools, blood in stools, or leg swelling.   She did notice a bed sore around her right hip, which was bleeding, this morning.  Patient has had three episodes of Gout flares since our last visit, which was treated with steroids. She is currently taking Allopurinol.   She has tolerated her IV Iron infusions well without any new or severe toxicities.   She has discontinued Gabapentin.   MEDICAL HISTORY:  Past Medical History:  Diagnosis Date   Allergy    bee stings   Anxiety    Asthmatic bronchitis    Broken arm 1957/2008   right   Cataract    had surgery    Depression    Diabetes mellitus without complication (HCC)    prediabetes diet controlled    Diverticulosis    GERD (gastroesophageal reflux disease)    Glaucoma    BIL   Hemorrhoids    Hx of adenomatous colonic polyps 12/02/2010   Hyperlipidemia    Hypertension    Hypothyroidism 05/2018   Inflammatory arthritis    Knee bursitis    Migraines    Obesity    Osteoarthritis    Sleep apnea     SURGICAL HISTORY: Past Surgical History:  Procedure Laterality Date   CARPAL TUNNEL RELEASE Left    left wrist   CATARACT EXTRACTION W/ INTRAOCULAR LENS  IMPLANT, BILATERAL Bilateral    1999 left, 2008 right   COLONOSCOPY     CORONARY ARTERY BYPASS GRAFT N/A 05/15/2019   Procedure: CORONARY ARTERY BYPASS GRAFTING (CABG) times two, left internal mammary artery to left anterior descending artery and left greater saphenous vein to posterior descending artery, left sapheouns vein harvested endoscopically ;  Surgeon: Norita Beauvais, MD;  Location: [facility] OR;  Service: Open Heart Surgery;  Laterality: N/A;   EXCISION MORTON'S NEUROMA Left 1978   left foot   GLAUCOMA SURGERY Bilateral    and laser surgery, 2004 left, 2008 right   LEFT HEART CATH AND CORONARY ANGIOGRAPHY N/A 05/13/2019   Procedure: LEFT HEART CATH AND CORONARY ANGIOGRAPHY;  Surgeon: Swaziland, Peter M, MD;  Location: [facility] INVASIVE CV LAB;  Service: Cardiovascular;  Laterality: N/A;   ORIF RADIAL FRACTURE Left 12/13/2019   Procedure: OPEN REDUCTION INTERNAL FIXATION (ORIF) FOREARM FRACTURE;  Surgeon: Arvil Birks, MD;  Location: [facility] OR;  Service:  Orthopedics;  Laterality: Left;   PARTIAL HYSTERECTOMY  1991   Left an ovary   POLYPECTOMY     TEE WITHOUT CARDIOVERSION N/A 05/15/2019   Procedure: TRANSESOPHAGEAL ECHOCARDIOGRAM (TEE);  Surgeon: Norita Beauvais, MD;  Location: [facility] OR;  Service: Open Heart Surgery;  Laterality: N/A;   TOENAIL AVULSION Right    big toe    SOCIAL HISTORY: Social History   Socioeconomic History   Marital status: Widowed    Spouse name: Allen Israel   Number of children: 0   Years of education: Not on file    Highest education level: Associate degree: occupational, Scientist, product/process development, or vocational program  Occupational History   Occupation: retired  Tobacco Use   Smoking status: Former    Current packs/day: 0.00    Types: Cigarettes    Quit date: 08/30/1971    Years since quitting: 52.2   Smokeless tobacco: Never  Vaping Use   Vaping status: Never Used  Substance and Sexual Activity   Alcohol use: No    Alcohol/week: 0.0 standard drinks of alcohol   Drug use: No   Sexual activity: Not Currently  Other Topics Concern   Not on file  Social History Narrative   She is widowed, no children, retired.  Lives in a one story home.  Retired from Technical brewer for [employer]   Social Drivers of Corporate investment banker Strain: Not on file  Food Insecurity: Not on file  Transportation Needs: Not on file  Physical Activity: Not on file  Stress: Not on file  Social Connections: Not on file  Intimate Partner Violence: Not on file    FAMILY HISTORY: Family History  Problem Relation Age of Onset   Colon polyps Maternal Grandfather    Bone cancer Maternal Grandfather    Hypertension Mother    Diabetes Mother    Parkinson's disease Mother    Hypertension Father    Diabetes Father    Heart Problems Father    Asthma Brother    Diabetes Brother    Breast cancer Brother        stage 4, both breast   Obesity Other    Diabetes Sister        x 2   COPD Sister    Asthma Sister    Clotting disorder Sister        blood count   Rectal cancer Neg Hx    Stomach cancer Neg Hx    Esophageal cancer Neg Hx     ALLERGIES:  is allergic to aspirin, bee pollen, codeine, fish oil, sulfa antibiotics, hornet venom, iron, lorazepam, pantoprazole, tape, tramadol hcl, camphor, lisinopril, penicillins, sulfasalazine, and vicodin [hydrocodone-acetaminophen].  MEDICATIONS:  Current Outpatient Medications  Medication Sig Dispense Refill   acetaminophen (TYLENOL) 500 MG tablet Take by mouth.     acetaminophen  (TYLENOL) 650 MG CR tablet Take 650 mg by mouth every 8 (eight) hours. (Patient not taking: Reported on 10/16/2023)     allopurinol (ZYLOPRIM) 100 MG tablet Take 0.5 tablets (50 mg total) by mouth daily for 14 days. 7 tablet 0   atorvastatin (LIPITOR) 80 MG tablet Take 1 tablet (80 mg total) by mouth daily at 6 PM. 30 tablet 2   azithromycin (ZITHROMAX) 250 MG tablet Take 2 tabs PO x 1 dose, then 1 tab PO QD x 4 days (Patient not taking: Reported on 10/16/2023) 6 tablet 0   calcium carbonate (TUMS - DOSED IN MG ELEMENTAL CALCIUM) 500 MG chewable tablet Chew 1-2 tablets by  mouth as needed for indigestion or heartburn.     Cholecalciferol (D 1000) 25 MCG (1000 UT) capsule Take by mouth.     clopidogrel (PLAVIX) 75 MG tablet Take 1 tablet (75 mg total) by mouth daily. 30 tablet 2   CVS B6 100 MG tablet Take 1 tablet by mouth at bedtime.     empagliflozin (JARDIANCE) 10 MG TABS tablet Take 10 mg by mouth daily.     EPINEPHrine 0.3 mg/0.3 mL IJ SOAJ injection Inject 0.3 mg into the muscle once as needed for anaphylaxis.      famotidine (PEPCID) 20 MG tablet Take 20 mg by mouth 2 (two) times daily.     furosemide (LASIX) 40 MG tablet Take 1 tablet (40 mg total) by mouth 2 (two) times daily. 90 tablet 3   gabapentin (NEURONTIN) 600 MG tablet Take by mouth. (Patient not taking: Reported on 10/16/2023)     levothyroxine (SYNTHROID) 75 MCG tablet Take 1 tablet (75 mcg total) by mouth daily before breakfast. 90 tablet 3   loperamide (IMODIUM A-D) 2 MG tablet 1 tablet as needed     LORazepam (ATIVAN) 0.5 MG tablet Take 0.5 mg by mouth every 8 (eight) hours. (Patient not taking: Reported on 10/16/2023)     losartan (COZAAR) 25 MG tablet Take 0.5 tablets (12.5 mg total) by mouth daily. 45 tablet 3   Magnesium Oxide 400 MG CAPS Take 1 capsule (400 mg total) by mouth daily. (Patient not taking: Reported on 10/16/2023) 90 capsule 3   meclizine (ANTIVERT) 25 MG tablet Take 25 mg by mouth 3 (three) times daily as needed  for dizziness.     Melatonin 3 MG TABS Take 1 tablet (3 mg total) by mouth at bedtime. FOR INSOMNIA 30 tablet 0   metFORMIN (GLUCOPHAGE-XR) 750 MG 24 hr tablet Take 750 mg by mouth daily in the afternoon.     metoprolol succinate (TOPROL-XL) 25 MG 24 hr tablet Take 25 mg by mouth daily.     metoprolol tartrate (LOPRESSOR) 25 MG tablet Take 0.5 tablets (12.5 mg total) by mouth 2 (two) times daily. (Patient not taking: Reported on 10/16/2023) 90 tablet 3   ondansetron (ZOFRAN) 4 MG tablet Take 1 tablet (4 mg total) by mouth every 6 (six) hours as needed for nausea or vomiting. GIVE 1 TABLET BY MOUTH EVERY 6 HOURS AS NEEDED FOR NAUSEA/VOMITING 20 tablet 0   ONETOUCH VERIO test strip USE TO TEST BLOOD GLUCOSE ONCE DAILY 100 each 0   pantoprazole (PROTONIX) 20 MG tablet Take 20 mg by mouth daily. (Patient not taking: Reported on 10/16/2023)     polyvinyl alcohol (LIQUIFILM TEARS) 1.4 % ophthalmic solution Place 1 drop into both eyes every 4 (four) hours as needed for dry eyes. 15 mL 0   potassium chloride SA (KLOR-CON) 20 MEQ tablet Take 1 tablet by mouth once a week. On Wednesday     predniSONE (STERAPRED UNI-PAK 21 TAB) 10 MG (21) TBPK tablet Take by mouth daily. Take 6 tabs by mouth daily  for 1 days, then 5 tabs for 1 days, then 4 tabs for 1 days, then 3 tabs for 1 days, 2 tabs for 1 days, then 1 tab by mouth daily for 1 days 21 tablet 0   RYBELSUS 14 MG TABS Take 1 tablet by mouth every morning.     Semaglutide (RYBELSUS) 7 MG TABS Take 7 mg by mouth daily. (Patient not taking: Reported on 10/16/2023)     spironolactone (ALDACTONE) 25 MG tablet  Take 25 mg by mouth at bedtime.     traMADol (ULTRAM) 50 MG tablet Take 50 mg by mouth 4 (four) times daily as needed. (Patient not taking: Reported on 10/16/2023)     vitamin B-12 (CYANOCOBALAMIN) 1000 MCG tablet 1 tab by orally daily 30 tablet 0   Vitamin D, Ergocalciferol, (DRISDOL) 1.25 MG (50000 UNIT) CAPS capsule TAKE 1 CAPSULE BY MOUTH EVERY  THURSDAY 15  capsule 1   Current Facility-Administered Medications  Medication Dose Route Frequency Provider Last Rate Last Admin   [START ON 05/13/2024] denosumab (PROLIA) injection 60 mg  60 mg Subcutaneous Q6 months Shamleffer, Ibtehal Jaralla, MD        REVIEW OF SYSTEMS:    10 Point review of Systems was done is negative except as noted above.   PHYSICAL EXAMINATION: ECOG PERFORMANCE STATUS: 2 - Symptomatic, <50% confined to bed .BP (!) 149/85 (BP Location: Left Arm, Patient Position: Sitting)   Pulse 85   Temp 97.7 F (36.5 C) (Temporal)   Resp 17   Wt 213 lb 11.2 oz (96.9 kg)   SpO2 99%   BMI 38.46 kg/m    GENERAL:alert, in no acute distress and comfortable SKIN: no acute rashes, no significant lesions EYES: conjunctiva are pink and non-injected, sclera anicteric OROPHARYNX: MMM, no exudates, no oropharyngeal erythema or ulceration NECK: supple, no JVD LYMPH:  no palpable lymphadenopathy in the cervical, axillary or inguinal regions LUNGS: clear to auscultation b/l with normal respiratory effort HEART: regular rate & rhythm ABDOMEN:  normoactive bowel sounds , non tender, not distended. Extremity: no pedal edema PSYCH: alert & oriented x 3 with fluent speech NEURO: no focal motor/sensory deficits    LABORATORY DATA: .    Latest Ref Rng & Units 12/04/2023   10:02 AM 06/21/2023    9:37 AM 12/20/2022    1:34 PM  CBC  WBC 4.0 - 10.5 K/uL 9.3  11.3  11.0   Hemoglobin 12.0 - 15.0 g/dL 16.1  09.6  04.5   Hematocrit 36.0 - 46.0 % 42.7  39.7  38.8   Platelets 150 - 400 K/uL 262  306  297    .    Latest Ref Rng & Units 12/04/2023   10:02 AM 06/21/2023    9:37 AM 12/20/2022    1:34 PM  CMP  Glucose 70 - 99 mg/dL 409  811  914   BUN 8 - 23 mg/dL 24  34  27   Creatinine 0.44 - 1.00 mg/dL 7.82  9.56  2.13   Sodium 135 - 145 mmol/L 139  137  141   Potassium 3.5 - 5.1 mmol/L 3.8  4.2  4.4   Chloride 98 - 111 mmol/L 105  103  105   CO2 22 - 32 mmol/L 24  24  28    Calcium 8.9 - 10.3  mg/dL 8.5  9.7  9.3   Total Protein 6.5 - 8.1 g/dL 7.2  7.5  7.4   Total Bilirubin 0.0 - 1.2 mg/dL 0.4  0.4  0.3   Alkaline Phos 38 - 126 U/L 104  127  122   AST 15 - 41 U/L 15  14  16    ALT 0 - 44 U/L 17  16  19     . Lab Results  Component Value Date   IRON 52 12/04/2023   TIBC 297 12/04/2023   IRONPCTSAT 18 12/04/2023   (Iron and TIBC)  Lab Results  Component Value Date   FERRITIN 76 12/04/2023  RADIOGRAPHIC STUDIES: I have personally reviewed the radiological images as listed and agreed with the findings in the report. No results found.    ASSESSMENT & PLAN:   78 y.o. female with   1) Normocytic Anemia Likely multifactorial.  But primarily seems to be be from iron deficiency at this time. Primarily anemia of chronic disease from b/l leg swelling and chronic venous stasis dermatitis. - patient recommended to f/u with PCP to optimize mx of her pedal edema and venous stasis dermatitis and wound care considerations. Some iron deficiency anemia Normal LDH - no evidence of hemolysis 09/04/18 Sed rate at 66 myeloma panel - no evidence of M spike   PLAN: -Discussed lab results from today, 12/04/2023, in detail with the patient. CBC stable. CMP wnl.  Iron labs -- ferritin 76 , iron saturation of 18% -no anemia at this time -advised patient to stay well hydrated -recommend drinking more water in early parts of the day  -answered all of patient's questions in detail -continue Iron polysaccharide 150mg  po daily and rpt labs with PCP in 6 months -RTC with Dr. Salomon Cree in one year.  -Answered all of patient's questions.   FOLLOW UP: Return to clinic with Dr. Salomon Cree with labs in 12 months  The total time spent in the appointment was 20 minutes* .  All of the patient's questions were answered with apparent satisfaction. The patient knows to call the clinic with any problems, questions or concerns.   Jacquelyn Matt MD MS AAHIVMS [facility] Hematology/Oncology Physician [facility]  .*Total Encounter Time as defined by the Centers for Medicare and Medicaid Services includes, in addition to the face-to-face time of a patient visit (documented in the note above) non-face-to-face time: obtaining and reviewing outside history, ordering and reviewing medications, tests or procedures, care coordination (communications with other health care professionals or caregivers) and documentation in the medical record.   I,Param Shah,acting as a Neurosurgeon for Jacquelyn Matt, MD.,have documented all relevant documentation on the behalf of Jacquelyn Matt, MD,as directed by  Jacquelyn Matt, MD while in the presence of Jacquelyn Matt, MD.  .I have reviewed the above documentation for accuracy and completeness, and I agree with the above. .Bronsyn Shappell Kishore Dequavious Harshberger MD

## 2023-12-05 ENCOUNTER — Ambulatory Visit: Payer: Medicare Other | Attending: Cardiovascular Disease | Admitting: Cardiovascular Disease

## 2023-12-05 ENCOUNTER — Encounter: Payer: Self-pay | Admitting: Cardiovascular Disease

## 2023-12-05 VITALS — BP 134/62 | HR 81 | Ht 62.5 in | Wt 209.0 lb

## 2023-12-05 DIAGNOSIS — I10 Essential (primary) hypertension: Secondary | ICD-10-CM

## 2023-12-05 DIAGNOSIS — E785 Hyperlipidemia, unspecified: Secondary | ICD-10-CM

## 2023-12-05 DIAGNOSIS — I5032 Chronic diastolic (congestive) heart failure: Secondary | ICD-10-CM | POA: Diagnosis not present

## 2023-12-05 DIAGNOSIS — I2510 Atherosclerotic heart disease of native coronary artery without angina pectoris: Secondary | ICD-10-CM | POA: Diagnosis not present

## 2023-12-05 DIAGNOSIS — E1169 Type 2 diabetes mellitus with other specified complication: Secondary | ICD-10-CM

## 2023-12-05 NOTE — Patient Instructions (Signed)
 Medication Instructions:  Your physician recommends that you continue on your current medications as directed. Please refer to the Current Medication list given to you today.  *If you need a refill on your cardiac medications before your next appointment, please call your pharmacy*  Lab Work: None ordered today. If you have labs (blood work) drawn today and your tests are completely normal, you will receive your results only by: MyChart Message (if you have MyChart) OR A paper copy in the mail If you have any lab test that is abnormal or we need to change your treatment, we will call you to review the results.  Testing/Procedures: None ordered today.  Follow-Up: At [facility], you and your health needs are our priority.  As part of our continuing mission to provide you with exceptional heart care, we have created designated Provider Care Teams.  These Care Teams include your primary Cardiologist (physician) and Advanced Practice Providers (APPs -  Physician Assistants and Nurse Practitioners) who all work together to provide you with the care you need, when you need it.  We recommend signing up for the patient portal called "MyChart".  Sign up information is provided on this After Visit Summary.  MyChart is used to connect with patients for Virtual Visits (Telemedicine).  Patients are able to view lab/test results, encounter notes, upcoming appointments, etc.  Non-urgent messages can be sent to your provider as well.   To learn more about what you can do with MyChart, go to [email or web address].    Your next appointment:   1 year(s)  The format for your next appointment:   In Person  Provider:   Available Provider  {  Other Instructions   1st Floor: - Lobby - Registration  - Pharmacy  - Lab - Cafe  2nd Floor: - PV Lab - Diagnostic Testing (echo, CT, nuclear med)  3rd Floor: - Vacant  4th Floor: - TCTS (cardiothoracic surgery) - AFib Clinic - Structural Heart  Clinic - Vascular Surgery  - Vascular Ultrasound  5th Floor: - [facility] (general and EP) - Clinical Pharmacy for coumadin, hypertension, lipid, weight-loss medications, and med management appointments    Valet parking services will be available as well.

## 2023-12-07 NOTE — Telephone Encounter (Signed)
 Last Prolia inj 11/15/23 Next Prolia inj due 05/18/24

## 2023-12-08 DIAGNOSIS — Z1231 Encounter for screening mammogram for malignant neoplasm of breast: Secondary | ICD-10-CM | POA: Diagnosis not present

## 2023-12-11 DIAGNOSIS — H401132 Primary open-angle glaucoma, bilateral, moderate stage: Secondary | ICD-10-CM | POA: Diagnosis not present

## 2023-12-14 DIAGNOSIS — H1032 Unspecified acute conjunctivitis, left eye: Secondary | ICD-10-CM | POA: Diagnosis not present

## 2023-12-19 DIAGNOSIS — G4733 Obstructive sleep apnea (adult) (pediatric): Secondary | ICD-10-CM | POA: Diagnosis not present

## 2023-12-22 DIAGNOSIS — H5941 Inflammation (infection) of postprocedural bleb, stage 1: Secondary | ICD-10-CM | POA: Diagnosis not present

## 2023-12-28 ENCOUNTER — Encounter: Payer: Self-pay | Admitting: Podiatry

## 2023-12-28 ENCOUNTER — Ambulatory Visit (INDEPENDENT_AMBULATORY_CARE_PROVIDER_SITE_OTHER): Payer: Medicare Other | Admitting: Podiatry

## 2023-12-28 DIAGNOSIS — M79675 Pain in left toe(s): Secondary | ICD-10-CM | POA: Diagnosis not present

## 2023-12-28 DIAGNOSIS — B351 Tinea unguium: Secondary | ICD-10-CM

## 2023-12-28 DIAGNOSIS — E0842 Diabetes mellitus due to underlying condition with diabetic polyneuropathy: Secondary | ICD-10-CM

## 2023-12-28 DIAGNOSIS — M79674 Pain in right toe(s): Secondary | ICD-10-CM

## 2023-12-28 NOTE — Progress Notes (Signed)
 This patient returns to my office for at risk foot care.  This patient requires this care by a professional since this patient will be at risk due to having diabetes and chronic kidney disease.  This patient is unable to cut nails herself since the patient cannot reach her nails.These nails are painful walking and wearing shoes.  She has a painful callus under her big toe joint right foot.  This is painful walking and wearing her shoes.    This patient presents for at risk foot care today.  General Appearance  Alert, conversant and in no acute stress.  Vascular  Dorsalis pedis and posterior tibial  pulses are weakly  palpable  bilaterally.  Capillary return is within normal limits  bilaterally. Temperature is within normal limits  bilaterally.  Neurologic  Senn-Weinstein monofilament wire test within normal limits/diminished   bilaterally. Muscle power within normal limits bilaterally.  Nails Thick disfigured discolored nails with subungual debris  from hallux to fifth toes bilaterally. No evidence of bacterial infection or drainage bilaterally.  Orthopedic  No limitations of motion  feet .  No crepitus or effusions noted.  No bony pathology or digital deformities noted.  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  Red inflamed hot lower legs.  Skin  normotropic skin  noted bilaterally.  No signs of infections or ulcers noted.  Callus sub 1st MPJ right foot asymptomatic. Red swollen front both legs.    Onychomycosis  Pain in right toes  Pain in left toes  Callus sub 1 right foot.    Consent was obtained for treatment procedures.   Mechanical debridement of nails 1-5  bilaterally performed with a nail nipper.  Filed with dremel without incident.  Debridement of callus with dremel tool.     Return office visit  3 months                   Told patient to return for periodic foot care and evaluation due to potential at risk complications.   Helane Gunther DPM

## 2024-01-03 ENCOUNTER — Other Ambulatory Visit: Payer: Self-pay | Admitting: Internal Medicine

## 2024-01-09 DIAGNOSIS — M109 Gout, unspecified: Secondary | ICD-10-CM | POA: Diagnosis not present

## 2024-01-09 DIAGNOSIS — E782 Mixed hyperlipidemia: Secondary | ICD-10-CM | POA: Diagnosis not present

## 2024-01-09 DIAGNOSIS — N183 Chronic kidney disease, stage 3 unspecified: Secondary | ICD-10-CM | POA: Diagnosis not present

## 2024-01-09 DIAGNOSIS — I10 Essential (primary) hypertension: Secondary | ICD-10-CM | POA: Diagnosis not present

## 2024-01-09 DIAGNOSIS — Z23 Encounter for immunization: Secondary | ICD-10-CM | POA: Diagnosis not present

## 2024-01-09 DIAGNOSIS — I509 Heart failure, unspecified: Secondary | ICD-10-CM | POA: Diagnosis not present

## 2024-01-09 DIAGNOSIS — I2510 Atherosclerotic heart disease of native coronary artery without angina pectoris: Secondary | ICD-10-CM | POA: Diagnosis not present

## 2024-01-09 DIAGNOSIS — E1122 Type 2 diabetes mellitus with diabetic chronic kidney disease: Secondary | ICD-10-CM | POA: Diagnosis not present

## 2024-01-09 DIAGNOSIS — E039 Hypothyroidism, unspecified: Secondary | ICD-10-CM | POA: Diagnosis not present

## 2024-01-09 LAB — LAB REPORT - SCANNED
A1c: 7.7
Creatinine, POC: 20 mg/dL
EGFR (Non-African Amer.): 35
Microalb Creat Ratio: UNDETERMINED
Microalbumin, Urine: <0.7
TSH: 0.75 (ref 0.41–5.90)

## 2024-01-16 DIAGNOSIS — E1142 Type 2 diabetes mellitus with diabetic polyneuropathy: Secondary | ICD-10-CM | POA: Diagnosis not present

## 2024-01-24 ENCOUNTER — Other Ambulatory Visit: Payer: Self-pay | Admitting: Internal Medicine

## 2024-02-13 DIAGNOSIS — E1122 Type 2 diabetes mellitus with diabetic chronic kidney disease: Secondary | ICD-10-CM | POA: Diagnosis not present

## 2024-03-14 DIAGNOSIS — G4733 Obstructive sleep apnea (adult) (pediatric): Secondary | ICD-10-CM | POA: Diagnosis not present

## 2024-03-25 ENCOUNTER — Other Ambulatory Visit: Payer: Self-pay | Admitting: Internal Medicine

## 2024-04-01 ENCOUNTER — Other Ambulatory Visit: Payer: Self-pay | Admitting: Internal Medicine

## 2024-04-08 ENCOUNTER — Ambulatory Visit (INDEPENDENT_AMBULATORY_CARE_PROVIDER_SITE_OTHER): Admitting: Podiatry

## 2024-04-08 ENCOUNTER — Encounter: Payer: Self-pay | Admitting: Podiatry

## 2024-04-08 DIAGNOSIS — M79675 Pain in left toe(s): Secondary | ICD-10-CM

## 2024-04-08 DIAGNOSIS — M79674 Pain in right toe(s): Secondary | ICD-10-CM | POA: Diagnosis not present

## 2024-04-08 DIAGNOSIS — E0842 Diabetes mellitus due to underlying condition with diabetic polyneuropathy: Secondary | ICD-10-CM

## 2024-04-08 DIAGNOSIS — B351 Tinea unguium: Secondary | ICD-10-CM

## 2024-04-08 NOTE — Progress Notes (Addendum)
 This patient returns to my office for at risk foot care.  This patient requires this care by a professional since this patient will be at risk due to having diabetes and chronic kidney disease.  This patient is unable to cut nails herself since the patient cannot reach her nails.These nails are painful walking and wearing shoes.  She has a painful callus under her big toe joint right foot.  This is painful walking and wearing her shoes.    This patient presents for at risk foot care today.  General Appearance  Alert, conversant and in no acute stress.  Vascular  Dorsalis pedis and posterior tibial  pulses are weakly  palpable  bilaterally.  Capillary return is within normal limits  bilaterally. Temperature is within normal limits  bilaterally.  Neurologic  Senn-Weinstein monofilament wire test within normal limits/diminished   bilaterally. Muscle power within normal limits bilaterally.  Nails Thick disfigured discolored nails with subungual debris  from hallux to fifth toes bilaterally. No evidence of bacterial infection or drainage bilaterally.  Orthopedic  No limitations of motion  feet .  No crepitus or effusions noted.  No bony pathology or digital deformities noted.  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    Skin  normotropic skin  noted bilaterally.  No signs of infections or ulcers noted.  Callus sub 1st MPJ right foot asymptomatic   Onychomycosis  Pain in right toes  Pain in left toes  Callus sub 1 right foot.    Consent was obtained for treatment procedures.   Mechanical debridement of nails 1-5  bilaterally performed with a nail nipper.  Filed with dremel without incident.     Return office visit  3 months                   Told patient to return for periodic foot care and evaluation due to potential at risk complications.   Cordella Bold DPM

## 2024-04-13 NOTE — Telephone Encounter (Signed)
 Prolia  VOB initiated via [email or web address]  Next Prolia  inj DUE: 04/2024

## 2024-05-01 ENCOUNTER — Encounter: Payer: Self-pay | Admitting: Internal Medicine

## 2024-05-01 ENCOUNTER — Ambulatory Visit (INDEPENDENT_AMBULATORY_CARE_PROVIDER_SITE_OTHER): Admitting: Internal Medicine

## 2024-05-01 VITALS — BP 110/70 | HR 74 | Ht 62.5 in | Wt 216.0 lb

## 2024-05-01 DIAGNOSIS — M810 Age-related osteoporosis without current pathological fracture: Secondary | ICD-10-CM

## 2024-05-01 DIAGNOSIS — E039 Hypothyroidism, unspecified: Secondary | ICD-10-CM | POA: Diagnosis not present

## 2024-05-01 MED ORDER — LEVOTHYROXINE SODIUM 75 MCG PO TABS: 75.0000 ug | ORAL_TABLET | Freq: Every day | ORAL | 3 refills | Status: DC

## 2024-05-01 NOTE — Patient Instructions (Signed)
 Increase calcium  tablets to 2 tablets in the morning and 2 tablets in the evening  Continue Ergocalciferol  (vitamin D ) 50,000 iu weekly  Continue Levothyroxine  75 mcg daily

## 2024-05-01 NOTE — Progress Notes (Unsigned)
 Name: Megan [name]  MRN/ DOB: [id number], 06-18-1946    Age/ Sex: 78 y.o., female    PCP: Loreli Kins, MD   Reason for Endocrinology Evaluation: Clinical Osteoporosis      Date of Initial Endocrinology Evaluation: 01/01/2021    HPI: [name] is a 78 y.o. female with a past medical history of T2DM, CAD, A. Fib . The patient presented for initial endocrinology clinic visit on 01/01/2021 for consultative assistance with her Osteoporosis     Pt was diagnosed with osteoporosis:09/2020  Menarche at age : 30 Menopausal at age : S/P hysterectomy 1992 Fracture Hx: left arm fracture 11/2019. S/P fall at home  Hx of HRT: no FH of osteoporosis or hip fracture: no Prior Hx of anti-estrogenic therapy : no  Prior Hx of anti-resorptive therapy : Received Prolia  01/20/2021   THYROID  HISTORY: Has been on LT-4 replacement since 2019 . Takes it appropriately  Of note , she was noted to have low TSh at 0.33 uIU/mL during labs 08/2020   SUBJECTIVE:    Today (05/01/24): Megan [name] is here for follow-up on osteoporosis and hypothyroidism  She continues to receive Prolia  injections, last dose was on 11/15/2023  Denies polydipsia  No constipation but had diarrhea 2 weeks ago , attributed to eating certain type of food  Denies recent falls or fractures  She has been out of levothyroxine  for ~ 5 days Denies palpitations  No local neck swelling   Calcium  500  mg , takes 2 chewables Twice daily - takes it 2 tabs once daily  Ergocalciferol  50,000 IUs weekly (Thursdays ) Prolia  60 mg SQ every 6 months Levothyroxine  75 mcg daily   HISTORY:  Past Medical History:  Past Medical History:  Diagnosis Date   Allergy    bee stings   Anxiety    Asthmatic bronchitis    Broken arm 1957/2008   right   Cataract    had surgery    Depression    Diabetes mellitus without complication (HCC)    prediabetes diet controlled   Diverticulosis    GERD (gastroesophageal reflux  disease)    Glaucoma    BIL   Hemorrhoids    Hx of adenomatous colonic polyps 12/02/2010   Hyperlipidemia    Hypertension    Hypothyroidism 05/2018   Inflammatory arthritis    Knee bursitis    Migraines    Obesity    Osteoarthritis    Sleep apnea    Past Surgical History:  Past Surgical History:  Procedure Laterality Date   CARPAL TUNNEL RELEASE Left    left wrist   CATARACT EXTRACTION W/ INTRAOCULAR LENS  IMPLANT, BILATERAL Bilateral    1999 left, 2008 right   COLONOSCOPY     CORONARY ARTERY BYPASS GRAFT N/A 05/15/2019   Procedure: CORONARY ARTERY BYPASS GRAFTING (CABG) times two, left internal mammary artery to left anterior descending artery and left greater saphenous vein to posterior descending artery, left sapheouns vein harvested endoscopically ;  Surgeon: Army Dallas NOVAK, MD;  Location: [facility] OR;  Service: Open Heart Surgery;  Laterality: N/A;   EXCISION MORTON'S NEUROMA Left 1978   left foot   GLAUCOMA SURGERY Bilateral    and laser surgery, 2004 left, 2008 right   LEFT HEART CATH AND CORONARY ANGIOGRAPHY N/A 05/13/2019   Procedure: LEFT HEART CATH AND CORONARY ANGIOGRAPHY;  Surgeon: [place]az[place], Peter M, MD;  Location: [facility] INVASIVE CV LAB;  Service: Cardiovascular;  Laterality: N/A;   ORIF RADIAL FRACTURE  Left 12/13/2019   Procedure: OPEN REDUCTION INTERNAL FIXATION (ORIF) FOREARM FRACTURE;  Surgeon: Shari Easter, MD;  Location: [facility] OR;  Service: Orthopedics;  Laterality: Left;   PARTIAL HYSTERECTOMY  1991   Left an ovary   POLYPECTOMY     TEE WITHOUT CARDIOVERSION N/A 05/15/2019   Procedure: TRANSESOPHAGEAL ECHOCARDIOGRAM (TEE);  Surgeon: Army Dallas NOVAK, MD;  Location: [facility] OR;  Service: Open Heart Surgery;  Laterality: N/A;   TOENAIL AVULSION Right    big toe    Social History:  reports that she quit smoking about 52 years ago. Her smoking use included cigarettes. She has never used smokeless tobacco. She reports that she does not drink alcohol  and does not use  drugs. Family History: family history includes Asthma in her brother and sister; Bone cancer in her maternal grandfather; Breast cancer in her brother; COPD in her sister; Clotting disorder in her sister; Colon polyps in her maternal grandfather; Diabetes in her brother, father, mother, and sister; Heart Problems in her father; Hypertension in her father and mother; Obesity in an other family member; Parkinson's disease in her mother.   HOME MEDICATIONS: Allergies as of 05/01/2024       Reactions   Aspirin  Anaphylaxis   Bee Pollen Swelling, Other (See Comments)   Extreme swelling due to bee stings   Codeine Nausea And Vomiting   Fish Oil Diarrhea   Sulfa Antibiotics Nausea And Vomiting   Hornet Venom Swelling, Other (See Comments)   Foot became swollen 3X it's normal size   Iron  Other (See Comments)   Severe Headache   Lorazepam  Other (See Comments)   Pantoprazole  Diarrhea   Tape Other (See Comments)   Tape leaves burn-like places and blisters   Tramadol  Hcl Other (See Comments)   Camphor Dermatitis   Lisinopril Cough   Penicillins Other (See Comments), Rash   Swelling was of the   Sulfasalazine Nausea And Vomiting   Vicodin [hydrocodone -acetaminophen ] Nausea And Vomiting        Medication List        Accurate as of May 01, 2024 10:11 AM. If you have any questions, ask your nurse or doctor.          acetaminophen  650 MG CR tablet Commonly known as: TYLENOL  Take 650 mg by mouth every 8 (eight) hours.   acetaminophen  500 MG tablet Commonly known as: TYLENOL  Take by mouth.   allopurinol  100 MG tablet Commonly known as: ZYLOPRIM  Take 0.5 tablets (50 mg total) by mouth daily for 14 days.   artificial tears ophthalmic solution Place 1 drop into both eyes every 4 (four) hours as needed for dry eyes.   atorvastatin  80 MG tablet Commonly known as: LIPITOR  Take 1 tablet (80 mg total) by mouth daily at 6 PM.   azithromycin  250 MG tablet Commonly known as:  ZITHROMAX  Take 2 tabs PO x 1 dose, then 1 tab PO QD x 4 days   calcium  carbonate 500 MG chewable tablet Commonly known as: TUMS - dosed in mg elemental calcium  Chew 1-2 tablets by mouth as needed for indigestion or heartburn.   clopidogrel  75 MG tablet Commonly known as: PLAVIX  Take 1 tablet (75 mg total) by mouth daily.   CVS B6 100 MG tablet Generic drug: pyridoxine Take 1 tablet by mouth at bedtime.   cyanocobalamin  1000 MCG tablet Commonly known as: VITAMIN B12 1 tab by orally daily   D 1000 25 MCG (1000 UT) capsule Generic drug: Cholecalciferol Take by mouth.   EPINEPHrine   0.3 mg/0.3 mL Soaj injection Commonly known as: EPI-PEN Inject 0.3 mg into the muscle once as needed for anaphylaxis.   famotidine  20 MG tablet Commonly known as: PEPCID  Take 20 mg by mouth 2 (two) times daily.   furosemide  40 MG tablet Commonly known as: LASIX  Take 1 tablet (40 mg total) by mouth 2 (two) times daily.   gabapentin  600 MG tablet Commonly known as: NEURONTIN  Take by mouth.   Jardiance 10 MG Tabs tablet Generic drug: empagliflozin Take 10 mg by mouth daily.   levothyroxine  75 MCG tablet Commonly known as: SYNTHROID  Take 1 tablet (75 mcg total) by mouth daily before breakfast.   loperamide 2 MG tablet Commonly known as: IMODIUM A-D 1 tablet as needed   LORazepam  0.5 MG tablet Commonly known as: ATIVAN  Take 0.5 mg by mouth every 8 (eight) hours.   losartan  25 MG tablet Commonly known as: COZAAR  Take 0.5 tablets (12.5 mg total) by mouth daily.   Magnesium  Oxide 400 MG Caps Take 1 capsule (400 mg total) by mouth daily.   meclizine 25 MG tablet Commonly known as: ANTIVERT Take 25 mg by mouth 3 (three) times daily as needed for dizziness.   melatonin 3 MG Tabs tablet Take 1 tablet (3 mg total) by mouth at bedtime. FOR INSOMNIA   metFORMIN  750 MG 24 hr tablet Commonly known as: GLUCOPHAGE -XR Take 750 mg by mouth daily in the afternoon.   metoprolol  succinate 25 MG  24 hr tablet Commonly known as: TOPROL -XL Take 25 mg by mouth daily.   metoprolol  tartrate 25 MG tablet Commonly known as: LOPRESSOR  Take 0.5 tablets (12.5 mg total) by mouth 2 (two) times daily.   ondansetron  4 MG tablet Commonly known as: ZOFRAN  Take 1 tablet (4 mg total) by mouth every 6 (six) hours as needed for nausea or vomiting. GIVE 1 TABLET BY MOUTH EVERY 6 HOURS AS NEEDED FOR NAUSEA/VOMITING   OneTouch Verio test strip Generic drug: glucose blood USE TO TEST BLOOD GLUCOSE ONCE DAILY   pantoprazole  20 MG tablet Commonly known as: PROTONIX  Take 20 mg by mouth daily.   potassium chloride  SA 20 MEQ tablet Commonly known as: KLOR-CON  M Take 1 tablet by mouth once a week. On Wednesday   predniSONE  10 MG (21) Tbpk tablet Commonly known as: STERAPRED UNI-PAK 21 TAB Take by mouth daily. Take 6 tabs by mouth daily  for 1 days, then 5 tabs for 1 days, then 4 tabs for 1 days, then 3 tabs for 1 days, 2 tabs for 1 days, then 1 tab by mouth daily for 1 days   Rybelsus 7 MG Tabs Generic drug: Semaglutide Take 7 mg by mouth daily.   Rybelsus 14 MG Tabs Generic drug: Semaglutide Take 1 tablet by mouth every morning.   spironolactone  25 MG tablet Commonly known as: ALDACTONE  Take 25 mg by mouth at bedtime.   traMADol  50 MG tablet Commonly known as: ULTRAM  Take 50 mg by mouth 4 (four) times daily as needed.   Vitamin D  (Ergocalciferol ) 1.25 MG (50000 UNIT) Caps capsule Commonly known as: DRISDOL  TAKE 1 CAPSULE BY MOUTH EVERY  THURSDAY          REVIEW OF SYSTEMS: A comprehensive ROS was conducted with the patient and is negative except as per HPI   OBJECTIVE:  VS: BP 110/70 (BP Location: Left Arm, Patient Position: Sitting, Cuff Size: Normal)   Pulse 74   Ht 5' 2.5 (1.588 m)   Wt 216 lb (98 kg)   SpO2  99%   BMI 38.88 kg/m    Wt Readings from Last 3 Encounters:  05/01/24 216 lb (98 kg)  12/05/23 209 lb (94.8 kg)  12/04/23 213 lb 11.2 oz (96.9 kg)      EXAM: General: Pt appears well and is in NAD  Neck: General: Supple without adenopathy. Thyroid : Thyroid  size normal.  Lungs: Clear with good BS bilat   Heart: Auscultation: RRR, + systolic murmur   Extremities: . BL LE: trace pretibial edema   Mental Status: Judgment, insight: Intact Orientation: Oriented to time, place, and person Mood and affect: No depression, anxiety, or agitation     DATA REVIEWED:   Latest Reference Range & Units 05/01/24 10:22  Sodium 135 - 146 mmol/L 139  Potassium 3.5 - 5.3 mmol/L 4.5  Chloride 98 - 110 mmol/L 100  CO2 20 - 32 mmol/L 28  Glucose 65 - 99 mg/dL 774 (H)  BUN 7 - 25 mg/dL 43 (H)  Creatinine 9.39 - 1.00 mg/dL 8.35 (H)  Calcium  8.6 - 10.4 mg/dL 89.9  BUN/Creatinine Ratio 6 - 22 (calc) 26 (H)  eGFR > OR = 60 mL/min/1.45m2 32 (L)    Latest Reference Range & Units 05/01/24 10:22  Vitamin D , 25-Hydroxy 30 - 100 ng/mL 97     Latest Reference Range & Units 05/01/24 10:22  TSH 0.40 - 4.50 mIU/L 2.56  Albumin  MSPROF 3.6 - 5.1 g/dL 4.6     DXA 0/0/7975  ASSESSMENT: The BMD measured at Forearm Radius 33% is 0.690 g/cm2 with a T-score of -2.1. This patient is considered osteopenic/low bone mass according to World Health Organization [facility]) criteria.   DualFemur Neck Left 05/08/2023 77.5 -2.1 0.748 g/cm2   Right Forearm Radius 33% 05/08/2023 77.5 -2.1 0.690 g/cm2   DualFemur Total Mean 05/08/2023 77.5 -1.6 0.810 g/cm2   ASSESSMENT/PLAN/RECOMMENDATIONS:   Osteoporosis :  -Despite DXA scan results consistent with low bone density, the patient meets criteria for clinical osteoporosis given fragility fracture of the left arm. - She was started on Prolia  in 12/2020 - I have again advised the patient to increase calcium  intake, discussed the importance of optimizing calcium  and vitamin D  intake - BMP continues to show low GFR, will encourage hydration - Vitamin D  is at the upper limit of normal, will switch ergocalciferol  to every  other week  Medications : Increase calcium  2 chewable tablets twice daily Change ergocalciferol  50,000 IUs to every other week  Continue  Prolia  60 mg SQ every 6 months    2.  Hypothyroid:  - Patient is clinically euthyroid - TSH continues to be normal, will refill current dose of levothyroxine   Medications Continue levothyroxine  75 MCG daily     Follow-up in 1 yr     Signed electronically by: Stefano Redgie Butts, MD  [facility] Endocrinology  [facility] [address] [place] [place], [place] [address] Phone: [phone] FAX: [phone]   CC: Loreli Kins, MD [address] [place] [address] Phone: [phone] Fax: [phone]   Return to Endocrinology clinic as below: Future Appointments  Date Time Provider Department Center  07/08/2024  9:30 AM Loreda Hacker, DPM [facility]  12/04/2024 10:00 AM [facility] None  12/04/2024 10:30 AM Onesimo Emaline Brink, MD [facility] None

## 2024-05-02 LAB — ALBUMIN: Albumin: 4.6 g/dL (ref 3.6–5.1)

## 2024-05-02 LAB — BASIC METABOLIC PANEL WITH GFR
BUN/Creatinine Ratio: 26 (calc) — ABNORMAL HIGH (ref 6–22)
BUN: 43 mg/dL — ABNORMAL HIGH (ref 7–25)
CO2: 28 mmol/L (ref 20–32)
Calcium: 10 mg/dL (ref 8.6–10.4)
Chloride: 100 mmol/L (ref 98–110)
Creat: 1.64 mg/dL — ABNORMAL HIGH (ref 0.60–1.00)
Glucose, Bld: 225 mg/dL — ABNORMAL HIGH (ref 65–99)
Potassium: 4.5 mmol/L (ref 3.5–5.3)
Sodium: 139 mmol/L (ref 135–146)
eGFR: 32 mL/min/1.73m2 — ABNORMAL LOW (ref >=60)

## 2024-05-02 LAB — VITAMIN D 25 HYDROXY (VIT D DEFICIENCY, FRACTURES): Vit D, 25-Hydroxy: 97 ng/mL (ref 30–100)

## 2024-05-02 LAB — TSH: TSH: 2.56 m[IU]/L (ref 0.40–4.50)

## 2024-05-03 ENCOUNTER — Ambulatory Visit: Payer: Self-pay | Admitting: Internal Medicine

## 2024-05-03 MED ORDER — LEVOTHYROXINE SODIUM 75 MCG PO TABS: 75.0000 ug | ORAL_TABLET | Freq: Every day | ORAL | 3 refills | Status: AC

## 2024-05-03 MED ORDER — VITAMIN D (ERGOCALCIFEROL) 1.25 MG (50000 UNIT) PO CAPS: 50000.0000 [IU] | ORAL_CAPSULE | ORAL | 3 refills | Status: AC

## 2024-05-13 NOTE — Telephone Encounter (Signed)
 Medical Buy and Bill  Prior Authorization REQUIRED for PROLIA    PA PROCESS DETAILS: PA is required and is on file. Authorization Number: [id number] Approval Date:  2023-10-22 Approved Number of Injections: 2 Treatments Expires on: 2024-10-21 The PA department can  be contacted at [phone].

## 2024-05-16 NOTE — Telephone Encounter (Signed)
 Medical Buy and [employer]ell  Prior Authorization on file and valid PA# [id number] Valid: 10/22/23-10/21/24

## 2024-05-20 NOTE — Telephone Encounter (Signed)
 Due to recent policy changes, VOB has been re-submitted.

## 2024-05-20 NOTE — Telephone Encounter (Signed)
 Due to recent policy changes, prior authorization has been re-submitted.   Medical [employer] and [employer]ell  Prior Authorization for PROLIA  APPROVED PA# [id number] Valid: 05/20/24-05/20/25

## 2024-05-21 NOTE — Telephone Encounter (Signed)
 Spoke with patient - advised of medication coverage change - reschedule appointment to Friday 10.10.25 @ 10:00 AM - patient requesting confirmation of new authorization and cost

## 2024-05-22 NOTE — Telephone Encounter (Signed)
 VOB pending

## 2024-05-24 ENCOUNTER — Ambulatory Visit

## 2024-05-30 NOTE — Telephone Encounter (Signed)
 Medical Buy and [employer]ell  Patient is ready for scheduling on or after 05/29/24  Out-of-pocket cost due at time of visit: $356.87  Primary: [employer] Medicare Adv HMO Prolia  co-insurance: 20% (approximately $331.87) Admin fee co-insurance: 20% (approximately $25)  Deductible: does not apply  Prior Auth: APPROVED PA# [id number] Valid: 05/20/24-05/20/25  Secondary: [employer] Prolia  co-insurance: plan will coordinate benefits and will  consider 100% of remaining expenses Admin fee co-insurance: plan will coordinate benefits and will  consider 100% of remaining expenses  Deductible: does not apply  Prior Auth: NOT required PA# Valid:   ** This summary of benefits is an estimation of the patient's out-of-pocket cost. Exact cost may vary based on individual plan coverage.

## 2024-06-07 ENCOUNTER — Ambulatory Visit

## 2024-06-07 VITALS — BP 130/72 | HR 75 | Ht 62.5 in | Wt 211.0 lb

## 2024-06-07 DIAGNOSIS — M810 Age-related osteoporosis without current pathological fracture: Secondary | ICD-10-CM

## 2024-06-07 MED ORDER — DENOSUMAB 60 MG/ML ~~LOC~~ SOSY: 60.0000 mg | PREFILLED_SYRINGE | SUBCUTANEOUS | Status: AC

## 2024-06-07 NOTE — Progress Notes (Signed)
 After obtaining consent, and per orders of Dr. Lonzo Cloud, injection of Prolia 60mg   given by Trevonte Ashkar L Davis Vannatter in right arm SQ. Patient instructed to remain in clinic for 20 minutes afterwards, and to report any adverse reaction to me immediately.

## 2024-06-16 NOTE — Telephone Encounter (Signed)
 Last Prolia  inj 06/07/24 Next Prolia  inj due 12/07/24

## 2024-06-19 DIAGNOSIS — Z961 Presence of intraocular lens: Secondary | ICD-10-CM | POA: Diagnosis not present

## 2024-06-19 DIAGNOSIS — H401132 Primary open-angle glaucoma, bilateral, moderate stage: Secondary | ICD-10-CM | POA: Diagnosis not present

## 2024-06-19 DIAGNOSIS — H52203 Unspecified astigmatism, bilateral: Secondary | ICD-10-CM | POA: Diagnosis not present

## 2024-06-19 DIAGNOSIS — H353112 Nonexudative age-related macular degeneration, right eye, intermediate dry stage: Secondary | ICD-10-CM | POA: Diagnosis not present

## 2024-06-19 DIAGNOSIS — E119 Type 2 diabetes mellitus without complications: Secondary | ICD-10-CM | POA: Diagnosis not present

## 2024-07-08 ENCOUNTER — Encounter: Payer: Self-pay | Admitting: Podiatry

## 2024-07-08 ENCOUNTER — Ambulatory Visit (INDEPENDENT_AMBULATORY_CARE_PROVIDER_SITE_OTHER): Admitting: Podiatry

## 2024-07-08 DIAGNOSIS — M79675 Pain in left toe(s): Secondary | ICD-10-CM

## 2024-07-08 DIAGNOSIS — M79674 Pain in right toe(s): Secondary | ICD-10-CM | POA: Diagnosis not present

## 2024-07-08 DIAGNOSIS — B351 Tinea unguium: Secondary | ICD-10-CM

## 2024-07-08 DIAGNOSIS — E0842 Diabetes mellitus due to underlying condition with diabetic polyneuropathy: Secondary | ICD-10-CM | POA: Diagnosis not present

## 2024-07-08 NOTE — Progress Notes (Signed)
 This patient returns to my office for at risk foot care.  This patient requires this care by a professional since this patient will be at risk due to having diabetes and chronic kidney disease.  This patient is unable to cut nails herself since the patient cannot reach her nails.These nails are painful walking and wearing shoes.      This patient presents for at risk foot care today.  General Appearance  Alert, conversant and in no acute stress.  Vascular  Dorsalis pedis and posterior tibial  pulses are weakly  palpable  bilaterally.  Capillary return is within normal limits  bilaterally. Temperature is within normal limits  bilaterally.  Neurologic  Senn-Weinstein monofilament wire test within normal limits/diminished   bilaterally. Muscle power within normal limits bilaterally.  Nails Thick disfigured discolored nails with subungual debris  from hallux to fifth toes bilaterally. No evidence of bacterial infection or drainage bilaterally.  Orthopedic  No limitations of motion  feet .  No crepitus or effusions noted.  No bony pathology or digital deformities noted.  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    Skin  normotropic skin  noted bilaterally.  No signs of infections or ulcers noted.    Onychomycosis  Pain in right toes  Pain in left toes  Callus sub  Consent was obtained for treatment procedures.   Mechanical debridement of nails 1-5  bilaterally performed with a nail nipper.  Filed with dremel without incident.     Return office visit  3 months                   Told patient to return for periodic foot care and evaluation due to potential at risk complications.   Cordella Bold DPM

## 2024-07-17 LAB — LAB REPORT - SCANNED
A1c: 7.4
EGFR: 41

## 2024-08-14 ENCOUNTER — Ambulatory Visit: Admitting: Podiatry

## 2024-10-07 ENCOUNTER — Ambulatory Visit: Admitting: Podiatry

## 2024-10-23 ENCOUNTER — Ambulatory Visit: Admitting: Podiatry

## 2024-10-25 ENCOUNTER — Ambulatory Visit: Admitting: Podiatry

## 2024-12-04 ENCOUNTER — Ambulatory Visit: Admitting: Hematology

## 2024-12-04 ENCOUNTER — Other Ambulatory Visit

## 2024-12-13 ENCOUNTER — Ambulatory Visit
